# Patient Record
Sex: Female | Born: 1960 | Race: Black or African American | Hispanic: No | Marital: Single
Health system: Midwestern US, Community
[De-identification: ages and names within clinical notes are randomized; demographics above are authoritative.]

## PROBLEM LIST (undated history)

## (undated) ENCOUNTER — Ambulatory Visit: Admission: EM | Payer: Medicare Other | Source: Home / Self Care

## (undated) DIAGNOSIS — Z5189 Encounter for other specified aftercare: Secondary | ICD-10-CM

## (undated) DIAGNOSIS — Z1501 Genetic susceptibility to malignant neoplasm of breast: Secondary | ICD-10-CM

## (undated) DIAGNOSIS — E785 Hyperlipidemia, unspecified: Secondary | ICD-10-CM

## (undated) DIAGNOSIS — G629 Polyneuropathy, unspecified: Secondary | ICD-10-CM

## (undated) DIAGNOSIS — I10 Essential (primary) hypertension: Secondary | ICD-10-CM

## (undated) DIAGNOSIS — E669 Obesity, unspecified: Secondary | ICD-10-CM

## (undated) DIAGNOSIS — F4320 Adjustment disorder, unspecified: Secondary | ICD-10-CM

## (undated) DIAGNOSIS — C569 Malignant neoplasm of unspecified ovary: Secondary | ICD-10-CM

## (undated) DIAGNOSIS — F419 Anxiety disorder, unspecified: Secondary | ICD-10-CM

## (undated) DIAGNOSIS — H539 Unspecified visual disturbance: Secondary | ICD-10-CM

## (undated) DIAGNOSIS — T7840XA Allergy, unspecified, initial encounter: Secondary | ICD-10-CM

## (undated) DIAGNOSIS — Z86711 Personal history of pulmonary embolism: Secondary | ICD-10-CM

## (undated) DIAGNOSIS — N921 Excessive and frequent menstruation with irregular cycle: Secondary | ICD-10-CM

## (undated) DIAGNOSIS — F329 Major depressive disorder, single episode, unspecified: Secondary | ICD-10-CM

## (undated) DIAGNOSIS — M1990 Unspecified osteoarthritis, unspecified site: Secondary | ICD-10-CM

## (undated) DIAGNOSIS — K5190 Ulcerative colitis, unspecified, without complications: Secondary | ICD-10-CM

## (undated) DIAGNOSIS — C801 Malignant (primary) neoplasm, unspecified: Secondary | ICD-10-CM

## (undated) DIAGNOSIS — D500 Iron deficiency anemia secondary to blood loss (chronic): Secondary | ICD-10-CM

## (undated) DIAGNOSIS — F32A Depression, unspecified: Secondary | ICD-10-CM

## (undated) DIAGNOSIS — D689 Coagulation defect, unspecified: Secondary | ICD-10-CM

## (undated) DIAGNOSIS — Z1502 Genetic susceptibility to malignant neoplasm of ovary: Principal | ICD-10-CM

## (undated) DIAGNOSIS — Z1509 Genetic susceptibility to other malignant neoplasm: Secondary | ICD-10-CM

## (undated) DIAGNOSIS — R1909 Other intra-abdominal and pelvic swelling, mass and lump: Secondary | ICD-10-CM

## (undated) DIAGNOSIS — I7300 Raynaud's syndrome without gangrene: Secondary | ICD-10-CM

## (undated) DIAGNOSIS — K219 Gastro-esophageal reflux disease without esophagitis: Secondary | ICD-10-CM

## (undated) DIAGNOSIS — G709 Myoneural disorder, unspecified: Secondary | ICD-10-CM

## (undated) DIAGNOSIS — C4492 Squamous cell carcinoma of skin, unspecified: Secondary | ICD-10-CM

## (undated) HISTORY — PX: REDUCTION MAMMAPLASTY: SUR839

## (undated) HISTORY — DX: Anxiety disorder, unspecified: F41.9

## (undated) HISTORY — DX: Polyneuropathy, unspecified: G62.9

## (undated) HISTORY — DX: Genetic susceptibility to malignant neoplasm of breast: Z15.01

## (undated) HISTORY — DX: Squamous cell carcinoma of skin, unspecified: C44.92

## (undated) HISTORY — DX: Depression, unspecified: F32.A

## (undated) HISTORY — PX: TOENAIL EXCISION: SUR558

## (undated) HISTORY — DX: Allergy, unspecified, initial encounter: T78.40XA

## (undated) HISTORY — DX: Genetic susceptibility to malignant neoplasm of ovary: Z15.02

## (undated) HISTORY — DX: Excessive and frequent menstruation with irregular cycle: N92.1

## (undated) HISTORY — DX: Adjustment disorder, unspecified: F43.20

## (undated) HISTORY — DX: Unspecified osteoarthritis, unspecified site: M19.90

## (undated) HISTORY — PX: TONSILLECTOMY: SHX5217

## (undated) HISTORY — PX: ESOPHAGOGASTRODUODENOSCOPY: SHX1529

## (undated) HISTORY — DX: Unspecified visual disturbance: H53.9

## (undated) HISTORY — DX: Iron deficiency anemia secondary to blood loss (chronic): D50.0

## (undated) HISTORY — DX: Major depressive disorder, single episode, unspecified: F32.9

## (undated) HISTORY — DX: Encounter for other specified aftercare: Z51.89

## (undated) HISTORY — DX: Raynaud's syndrome without gangrene: I73.00

## (undated) HISTORY — DX: Hyperlipidemia, unspecified: E78.5

## (undated) HISTORY — DX: Coagulation defect, unspecified: D68.9

## (undated) HISTORY — DX: Genetic susceptibility to other malignant neoplasm: Z15.09

## (undated) HISTORY — DX: Ulcerative colitis, unspecified, without complications: K51.90

## (undated) HISTORY — PX: COLONOSCOPY: SHX174

## (undated) HISTORY — DX: Malignant (primary) neoplasm, unspecified: C80.1

## (undated) HISTORY — DX: Other intra-abdominal and pelvic swelling, mass and lump: R19.09

## (undated) HISTORY — PX: AUGMENTATION MAMMAPLASTY: SUR837

## (undated) HISTORY — PX: BREAST BIOPSY: SHX20

## (undated) HISTORY — DX: Obesity, unspecified: E66.9

## (undated) HISTORY — DX: Myoneural disorder, unspecified: G70.9

## (undated) HISTORY — DX: Gastro-esophageal reflux disease without esophagitis: K21.9

## (undated) HISTORY — DX: Personal history of pulmonary embolism: Z86.711

---
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
## 1998-05-13 HISTORY — PX: BREAST REDUCTION SURGERY: SHX8

## 1998-05-22 ENCOUNTER — Ambulatory Visit (HOSPITAL_BASED_OUTPATIENT_CLINIC_OR_DEPARTMENT_OTHER): Admission: RE | Admit: 1998-05-22 | Discharge: 1998-05-22 | Payer: Self-pay | Admitting: Specialist

## 1999-07-09 ENCOUNTER — Other Ambulatory Visit: Admission: RE | Admit: 1999-07-09 | Discharge: 1999-07-09 | Payer: Self-pay | Admitting: Gynecology

## 1999-07-17 ENCOUNTER — Encounter (INDEPENDENT_AMBULATORY_CARE_PROVIDER_SITE_OTHER): Payer: Self-pay

## 1999-07-17 ENCOUNTER — Other Ambulatory Visit: Admission: RE | Admit: 1999-07-17 | Discharge: 1999-07-17 | Payer: Self-pay | Admitting: Gynecology

## 1999-11-05 ENCOUNTER — Encounter: Payer: Self-pay | Admitting: Internal Medicine

## 2000-03-04 ENCOUNTER — Other Ambulatory Visit: Admission: RE | Admit: 2000-03-04 | Discharge: 2000-03-04 | Payer: Self-pay | Admitting: Gynecology

## 2000-09-24 ENCOUNTER — Other Ambulatory Visit: Admission: RE | Admit: 2000-09-24 | Discharge: 2000-09-24 | Payer: Self-pay | Admitting: Gynecology

## 2002-01-20 ENCOUNTER — Emergency Department (HOSPITAL_COMMUNITY): Admission: EM | Admit: 2002-01-20 | Discharge: 2002-01-20 | Payer: Self-pay | Admitting: Emergency Medicine

## 2002-12-24 ENCOUNTER — Encounter: Payer: Self-pay | Admitting: Internal Medicine

## 2002-12-24 ENCOUNTER — Other Ambulatory Visit: Admission: RE | Admit: 2002-12-24 | Discharge: 2002-12-24 | Payer: Self-pay | Admitting: Internal Medicine

## 2003-01-28 ENCOUNTER — Ambulatory Visit: Admission: RE | Admit: 2003-01-28 | Discharge: 2003-03-12 | Payer: Self-pay | Admitting: Radiation Oncology

## 2003-02-14 ENCOUNTER — Encounter: Payer: Self-pay | Admitting: Internal Medicine

## 2003-02-21 ENCOUNTER — Encounter: Payer: Self-pay | Admitting: Internal Medicine

## 2003-06-08 ENCOUNTER — Ambulatory Visit: Admission: RE | Admit: 2003-06-08 | Discharge: 2003-06-23 | Payer: Self-pay | Admitting: Radiation Oncology

## 2003-07-07 ENCOUNTER — Ambulatory Visit: Admission: RE | Admit: 2003-07-07 | Discharge: 2003-07-07 | Payer: Self-pay | Admitting: Radiation Oncology

## 2003-09-22 ENCOUNTER — Other Ambulatory Visit: Admission: RE | Admit: 2003-09-22 | Discharge: 2003-09-22 | Payer: Self-pay | Admitting: Gynecology

## 2003-09-29 ENCOUNTER — Ambulatory Visit (HOSPITAL_COMMUNITY): Admission: RE | Admit: 2003-09-29 | Discharge: 2003-09-29 | Payer: Self-pay | Admitting: Gynecology

## 2004-01-22 ENCOUNTER — Emergency Department (HOSPITAL_COMMUNITY): Admission: EM | Admit: 2004-01-22 | Discharge: 2004-01-22 | Payer: Self-pay | Admitting: Emergency Medicine

## 2004-06-27 ENCOUNTER — Other Ambulatory Visit: Admission: RE | Admit: 2004-06-27 | Discharge: 2004-06-27 | Payer: Self-pay | Admitting: Gynecology

## 2004-09-18 ENCOUNTER — Ambulatory Visit: Payer: Self-pay | Admitting: Internal Medicine

## 2005-02-27 ENCOUNTER — Ambulatory Visit: Payer: Self-pay | Admitting: Internal Medicine

## 2005-04-15 ENCOUNTER — Ambulatory Visit: Payer: Self-pay | Admitting: Hematology & Oncology

## 2005-04-17 ENCOUNTER — Emergency Department (HOSPITAL_COMMUNITY): Admission: EM | Admit: 2005-04-17 | Discharge: 2005-04-17 | Payer: Self-pay | Admitting: Emergency Medicine

## 2005-04-18 ENCOUNTER — Encounter: Admission: RE | Admit: 2005-04-18 | Discharge: 2005-04-18 | Payer: Self-pay | Admitting: Internal Medicine

## 2005-04-29 ENCOUNTER — Ambulatory Visit: Payer: Self-pay | Admitting: Internal Medicine

## 2005-07-25 ENCOUNTER — Encounter: Admission: RE | Admit: 2005-07-25 | Discharge: 2005-07-25 | Payer: Self-pay | Admitting: Internal Medicine

## 2005-12-19 ENCOUNTER — Other Ambulatory Visit: Admission: RE | Admit: 2005-12-19 | Discharge: 2005-12-19 | Payer: Self-pay | Admitting: Gynecology

## 2006-02-25 ENCOUNTER — Encounter: Admission: RE | Admit: 2006-02-25 | Discharge: 2006-02-25 | Payer: Self-pay | Admitting: Diagnostic Radiology

## 2006-03-13 ENCOUNTER — Ambulatory Visit (HOSPITAL_COMMUNITY): Admission: RE | Admit: 2006-03-13 | Discharge: 2006-03-13 | Payer: Self-pay | Admitting: Diagnostic Radiology

## 2006-03-18 ENCOUNTER — Ambulatory Visit (HOSPITAL_COMMUNITY): Admission: RE | Admit: 2006-03-18 | Discharge: 2006-03-19 | Payer: Self-pay | Admitting: Diagnostic Radiology

## 2006-04-24 ENCOUNTER — Encounter: Admission: RE | Admit: 2006-04-24 | Discharge: 2006-04-24 | Payer: Self-pay | Admitting: Diagnostic Radiology

## 2006-09-22 ENCOUNTER — Ambulatory Visit: Payer: Self-pay | Admitting: Internal Medicine

## 2006-09-23 LAB — CONVERTED CEMR LAB
Albumin: 3.6 g/dL (ref 3.5–5.2)
Alkaline Phosphatase: 60 units/L (ref 39–117)
BUN: 10 mg/dL (ref 6–23)
Basophils Absolute: 0.1 10*3/uL (ref 0.0–0.1)
Cholesterol: 192 mg/dL (ref 0–200)
GFR calc Af Amer: 87 mL/min
GFR calc non Af Amer: 72 mL/min
HDL: 35.4 mg/dL — ABNORMAL LOW (ref >39.0)
Hemoglobin: 10.2 g/dL — ABNORMAL LOW (ref 12.0–15.0)
LDL Cholesterol: 129 mg/dL — ABNORMAL HIGH (ref 0–99)
Lymphocytes Relative: 9.3 % — ABNORMAL LOW (ref 12.0–46.0)
MCHC: 31.5 g/dL (ref 30.0–36.0)
Monocytes Absolute: 0.4 10*3/uL (ref 0.2–0.7)
Monocytes Relative: 3.6 % (ref 3.0–11.0)
Neutro Abs: 8.3 10*3/uL — ABNORMAL HIGH (ref 1.4–7.7)
Potassium: 3.8 meq/L (ref 3.5–5.1)
RDW: 20.6 % — ABNORMAL HIGH (ref 11.5–14.6)
Triglycerides: 137 mg/dL (ref 0–149)

## 2006-09-24 ENCOUNTER — Encounter: Admission: RE | Admit: 2006-09-24 | Discharge: 2006-09-24 | Payer: Self-pay | Admitting: Internal Medicine

## 2006-09-27 ENCOUNTER — Encounter: Admission: RE | Admit: 2006-09-27 | Discharge: 2006-09-27 | Payer: Self-pay | Admitting: Diagnostic Radiology

## 2006-10-01 ENCOUNTER — Encounter: Admission: RE | Admit: 2006-10-01 | Discharge: 2006-10-01 | Payer: Self-pay | Admitting: Diagnostic Radiology

## 2006-10-08 ENCOUNTER — Encounter: Admission: RE | Admit: 2006-10-08 | Discharge: 2006-10-08 | Payer: Self-pay | Admitting: Internal Medicine

## 2006-10-08 ENCOUNTER — Encounter (INDEPENDENT_AMBULATORY_CARE_PROVIDER_SITE_OTHER): Payer: Self-pay | Admitting: Diagnostic Radiology

## 2006-12-15 DIAGNOSIS — Z86718 Personal history of other venous thrombosis and embolism: Secondary | ICD-10-CM | POA: Insufficient documentation

## 2006-12-15 DIAGNOSIS — D509 Iron deficiency anemia, unspecified: Secondary | ICD-10-CM

## 2006-12-17 ENCOUNTER — Ambulatory Visit: Payer: Self-pay | Admitting: Internal Medicine

## 2006-12-17 DIAGNOSIS — L659 Nonscarring hair loss, unspecified: Secondary | ICD-10-CM | POA: Insufficient documentation

## 2006-12-17 DIAGNOSIS — I8390 Asymptomatic varicose veins of unspecified lower extremity: Secondary | ICD-10-CM | POA: Insufficient documentation

## 2006-12-17 DIAGNOSIS — M79609 Pain in unspecified limb: Secondary | ICD-10-CM

## 2006-12-17 DIAGNOSIS — E785 Hyperlipidemia, unspecified: Secondary | ICD-10-CM

## 2006-12-17 DIAGNOSIS — R030 Elevated blood-pressure reading, without diagnosis of hypertension: Secondary | ICD-10-CM

## 2006-12-17 DIAGNOSIS — I10 Essential (primary) hypertension: Secondary | ICD-10-CM | POA: Insufficient documentation

## 2006-12-17 DIAGNOSIS — D649 Anemia, unspecified: Secondary | ICD-10-CM

## 2007-01-16 ENCOUNTER — Ambulatory Visit: Payer: Self-pay | Admitting: Internal Medicine

## 2007-01-16 DIAGNOSIS — C4490 Unspecified malignant neoplasm of skin, unspecified: Secondary | ICD-10-CM | POA: Insufficient documentation

## 2007-01-16 DIAGNOSIS — F17200 Nicotine dependence, unspecified, uncomplicated: Secondary | ICD-10-CM

## 2007-01-19 ENCOUNTER — Encounter: Payer: Self-pay | Admitting: Internal Medicine

## 2007-01-22 ENCOUNTER — Encounter: Payer: Self-pay | Admitting: Internal Medicine

## 2007-02-02 ENCOUNTER — Ambulatory Visit: Payer: Self-pay | Admitting: Internal Medicine

## 2007-02-02 DIAGNOSIS — J309 Allergic rhinitis, unspecified: Secondary | ICD-10-CM | POA: Insufficient documentation

## 2007-02-26 ENCOUNTER — Ambulatory Visit: Payer: Self-pay | Admitting: Internal Medicine

## 2007-03-03 LAB — CONVERTED CEMR LAB
Calcium: 9.8 mg/dL (ref 8.4–10.5)
Chloride: 103 meq/L (ref 96–112)
GFR calc non Af Amer: 82 mL/min
HDL: 33.5 mg/dL — ABNORMAL LOW (ref >39.0)
LDL Cholesterol: 137 mg/dL — ABNORMAL HIGH (ref 0–99)
Sodium: 137 meq/L (ref 135–145)
VLDL: 21 mg/dL (ref 0–40)

## 2007-03-05 ENCOUNTER — Ambulatory Visit: Payer: Self-pay | Admitting: Internal Medicine

## 2007-03-12 ENCOUNTER — Encounter: Payer: Self-pay | Admitting: Internal Medicine

## 2007-03-16 ENCOUNTER — Encounter: Admission: RE | Admit: 2007-03-16 | Discharge: 2007-03-16 | Payer: Self-pay | Admitting: Otolaryngology

## 2007-03-31 ENCOUNTER — Encounter: Payer: Self-pay | Admitting: Internal Medicine

## 2007-06-04 ENCOUNTER — Ambulatory Visit: Payer: Self-pay | Admitting: Internal Medicine

## 2007-06-08 LAB — CONVERTED CEMR LAB
CO2: 27 meq/L (ref 19–32)
Calcium: 9.5 mg/dL (ref 8.4–10.5)
Cholesterol: 132 mg/dL (ref 0–200)
GFR calc Af Amer: 99 mL/min
Glucose, Bld: 88 mg/dL (ref 70–99)
Total CHOL/HDL Ratio: 4.1

## 2007-06-12 ENCOUNTER — Ambulatory Visit: Payer: Self-pay | Admitting: Internal Medicine

## 2007-06-12 DIAGNOSIS — I7300 Raynaud's syndrome without gangrene: Secondary | ICD-10-CM

## 2007-06-12 DIAGNOSIS — E349 Endocrine disorder, unspecified: Secondary | ICD-10-CM | POA: Insufficient documentation

## 2007-06-12 DIAGNOSIS — Z9884 Bariatric surgery status: Secondary | ICD-10-CM | POA: Insufficient documentation

## 2007-06-12 LAB — CONVERTED CEMR LAB: HDL goal, serum: 40 mg/dL

## 2007-06-16 ENCOUNTER — Telehealth: Payer: Self-pay | Admitting: Internal Medicine

## 2007-08-26 ENCOUNTER — Ambulatory Visit: Payer: Self-pay | Admitting: Internal Medicine

## 2007-09-07 ENCOUNTER — Ambulatory Visit: Payer: Self-pay | Admitting: Internal Medicine

## 2007-09-09 LAB — CONVERTED CEMR LAB
Basophils Absolute: 0 10*3/uL (ref 0.0–0.1)
Chloride: 108 meq/L (ref 96–112)
Cholesterol: 147 mg/dL (ref 0–200)
Eosinophils Absolute: 0.1 10*3/uL (ref 0.0–0.7)
Ferritin: 17.1 ng/mL (ref 10.0–291.0)
GFR calc Af Amer: 86 mL/min
GFR calc non Af Amer: 71 mL/min
HDL: 37.5 mg/dL — ABNORMAL LOW (ref >39.0)
MCHC: 33.1 g/dL (ref 30.0–36.0)
MCV: 88.1 fL (ref 78.0–100.0)
Neutrophils Relative %: 68.6 % (ref 43.0–77.0)
Platelets: 227 10*3/uL (ref 150–400)
Potassium: 3.7 meq/L (ref 3.5–5.1)
RDW: 13.9 % (ref 11.5–14.6)
Sodium: 142 meq/L (ref 135–145)
Triglycerides: 120 mg/dL (ref 0–149)
VLDL: 24 mg/dL (ref 0–40)
WBC: 6.7 10*3/uL (ref 4.5–10.5)

## 2007-09-15 ENCOUNTER — Ambulatory Visit: Payer: Self-pay | Admitting: Internal Medicine

## 2007-09-15 DIAGNOSIS — E559 Vitamin D deficiency, unspecified: Secondary | ICD-10-CM

## 2007-12-08 ENCOUNTER — Ambulatory Visit: Payer: Self-pay | Admitting: Internal Medicine

## 2007-12-08 LAB — CONVERTED CEMR LAB
CO2: 27 meq/L (ref 19–32)
Calcium: 9.2 mg/dL (ref 8.4–10.5)
Chloride: 105 meq/L (ref 96–112)
Cholesterol: 195 mg/dL (ref 0–200)
Creatinine, Ser: 0.9 mg/dL (ref 0.4–1.2)
Glucose, Bld: 96 mg/dL (ref 70–99)
HDL: 38.6 mg/dL — ABNORMAL LOW (ref >39.0)
TSH: 4.14 microintl units/mL (ref 0.35–5.50)
Total CHOL/HDL Ratio: 5.1
Triglycerides: 139 mg/dL (ref 0–149)

## 2007-12-14 LAB — CONVERTED CEMR LAB: Vit D, 1,25-Dihydroxy: 18 — ABNORMAL LOW (ref 30–89)

## 2007-12-15 ENCOUNTER — Ambulatory Visit: Payer: Self-pay | Admitting: Internal Medicine

## 2008-02-08 ENCOUNTER — Ambulatory Visit: Payer: Self-pay | Admitting: Internal Medicine

## 2008-02-08 DIAGNOSIS — R05 Cough: Secondary | ICD-10-CM

## 2008-02-11 ENCOUNTER — Telehealth (INDEPENDENT_AMBULATORY_CARE_PROVIDER_SITE_OTHER): Payer: Self-pay | Admitting: *Deleted

## 2008-03-01 ENCOUNTER — Encounter: Payer: Self-pay | Admitting: Internal Medicine

## 2008-03-03 ENCOUNTER — Encounter: Admission: RE | Admit: 2008-03-03 | Discharge: 2008-03-03 | Payer: Self-pay | Admitting: Gynecology

## 2008-03-04 ENCOUNTER — Encounter: Admission: RE | Admit: 2008-03-04 | Discharge: 2008-03-04 | Payer: Self-pay | Admitting: Surgery

## 2008-03-22 ENCOUNTER — Ambulatory Visit: Payer: Self-pay | Admitting: Internal Medicine

## 2008-03-22 LAB — CONVERTED CEMR LAB
ALT: 15 units/L (ref 0–35)
CO2: 28 meq/L (ref 19–32)
Calcium: 9.2 mg/dL (ref 8.4–10.5)
Creatinine, Ser: 0.9 mg/dL (ref 0.4–1.2)
GFR calc Af Amer: 86 mL/min
Glucose, Bld: 88 mg/dL (ref 70–99)
HDL: 35.4 mg/dL — ABNORMAL LOW (ref >39.0)
Sodium: 140 meq/L (ref 135–145)
Total Bilirubin: 0.8 mg/dL (ref 0.3–1.2)
Total CHOL/HDL Ratio: 4.5
Total Protein: 6.4 g/dL (ref 6.0–8.3)
Triglycerides: 168 mg/dL — ABNORMAL HIGH (ref 0–149)
VLDL: 34 mg/dL (ref 0–40)
Vit D, 1,25-Dihydroxy: 20 — ABNORMAL LOW (ref 30–89)

## 2008-03-28 ENCOUNTER — Other Ambulatory Visit: Admission: RE | Admit: 2008-03-28 | Discharge: 2008-03-28 | Payer: Self-pay | Admitting: Gynecology

## 2008-04-11 ENCOUNTER — Ambulatory Visit: Payer: Self-pay | Admitting: Internal Medicine

## 2008-04-12 ENCOUNTER — Encounter: Payer: Self-pay | Admitting: Internal Medicine

## 2008-04-12 DIAGNOSIS — C569 Malignant neoplasm of unspecified ovary: Secondary | ICD-10-CM

## 2008-04-12 HISTORY — DX: Malignant neoplasm of unspecified ovary: C56.9

## 2008-04-27 ENCOUNTER — Ambulatory Visit (HOSPITAL_COMMUNITY): Admission: RE | Admit: 2008-04-27 | Discharge: 2008-04-27 | Payer: Self-pay | Admitting: Surgery

## 2008-04-27 ENCOUNTER — Encounter (INDEPENDENT_AMBULATORY_CARE_PROVIDER_SITE_OTHER): Payer: Self-pay | Admitting: Surgery

## 2008-05-02 ENCOUNTER — Ambulatory Visit: Payer: Self-pay | Admitting: Internal Medicine

## 2008-05-02 ENCOUNTER — Telehealth: Payer: Self-pay | Admitting: *Deleted

## 2008-05-02 ENCOUNTER — Ambulatory Visit: Payer: Self-pay | Admitting: Oncology

## 2008-05-02 DIAGNOSIS — R4589 Other symptoms and signs involving emotional state: Secondary | ICD-10-CM | POA: Insufficient documentation

## 2008-05-03 ENCOUNTER — Ambulatory Visit: Payer: Self-pay | Admitting: Internal Medicine

## 2008-05-03 ENCOUNTER — Ambulatory Visit: Payer: Self-pay | Admitting: Hematology & Oncology

## 2008-05-03 ENCOUNTER — Telehealth: Payer: Self-pay | Admitting: Internal Medicine

## 2008-05-09 ENCOUNTER — Encounter: Payer: Self-pay | Admitting: Internal Medicine

## 2008-05-09 LAB — CBC WITH DIFFERENTIAL (CANCER CENTER ONLY)
BASO%: 0.6 % (ref 0.0–2.0)
LYMPH%: 22.6 % (ref 14.0–48.0)
MCH: 28.9 pg (ref 26.0–34.0)
MCV: 87 fL (ref 81–101)
MONO%: 5.2 % (ref 0.0–13.0)
NEUT%: 70.2 % (ref 39.6–80.0)
Platelets: 249 10*3/uL (ref 145–400)
RDW: 12.5 % (ref 10.5–14.6)

## 2008-05-09 LAB — CA 125: CA 125: 7.3 U/mL (ref 0.0–30.2)

## 2008-05-09 LAB — CEA: CEA: 2.2 ng/mL (ref 0.0–5.0)

## 2008-05-09 LAB — COMPREHENSIVE METABOLIC PANEL
ALT: 15 U/L (ref 0–35)
Alkaline Phosphatase: 57 U/L (ref 39–117)
CO2: 24 mEq/L (ref 19–32)
Potassium: 3.7 mEq/L (ref 3.5–5.3)
Sodium: 138 mEq/L (ref 135–145)
Total Bilirubin: 0.6 mg/dL (ref 0.3–1.2)
Total Protein: 7.2 g/dL (ref 6.0–8.3)

## 2008-05-10 ENCOUNTER — Ambulatory Visit: Admission: RE | Admit: 2008-05-10 | Discharge: 2008-05-10 | Payer: Self-pay | Admitting: Gynecology

## 2008-05-11 ENCOUNTER — Ambulatory Visit (HOSPITAL_COMMUNITY): Admission: RE | Admit: 2008-05-11 | Discharge: 2008-05-11 | Payer: Self-pay | Admitting: Hematology & Oncology

## 2008-05-13 HISTORY — PX: ABDOMINAL HYSTERECTOMY: SHX81

## 2008-05-17 ENCOUNTER — Encounter: Payer: Self-pay | Admitting: Gynecologic Oncology

## 2008-05-17 ENCOUNTER — Encounter: Payer: Self-pay | Admitting: Internal Medicine

## 2008-05-17 ENCOUNTER — Ambulatory Visit (HOSPITAL_COMMUNITY): Admission: RE | Admit: 2008-05-17 | Discharge: 2008-05-17 | Payer: Self-pay | Admitting: Gynecologic Oncology

## 2008-05-18 ENCOUNTER — Telehealth: Payer: Self-pay | Admitting: Internal Medicine

## 2008-05-24 ENCOUNTER — Inpatient Hospital Stay (HOSPITAL_COMMUNITY): Admission: RE | Admit: 2008-05-24 | Discharge: 2008-05-27 | Payer: Self-pay | Admitting: Obstetrics & Gynecology

## 2008-05-24 ENCOUNTER — Encounter: Payer: Self-pay | Admitting: Gynecology

## 2008-05-26 ENCOUNTER — Encounter: Payer: Self-pay | Admitting: Gynecology

## 2008-05-31 ENCOUNTER — Encounter: Payer: Self-pay | Admitting: Internal Medicine

## 2008-06-03 ENCOUNTER — Encounter: Payer: Self-pay | Admitting: Emergency Medicine

## 2008-06-04 ENCOUNTER — Inpatient Hospital Stay (HOSPITAL_COMMUNITY): Admission: AD | Admit: 2008-06-04 | Discharge: 2008-06-06 | Payer: Self-pay | Admitting: Obstetrics & Gynecology

## 2008-06-14 ENCOUNTER — Ambulatory Visit: Admission: RE | Admit: 2008-06-14 | Discharge: 2008-06-14 | Payer: Self-pay | Admitting: Gynecologic Oncology

## 2008-06-14 ENCOUNTER — Encounter: Payer: Self-pay | Admitting: Gynecologic Oncology

## 2008-06-20 ENCOUNTER — Ambulatory Visit: Payer: Self-pay | Admitting: Hematology & Oncology

## 2008-06-21 ENCOUNTER — Encounter: Payer: Self-pay | Admitting: Internal Medicine

## 2008-06-21 LAB — COMPREHENSIVE METABOLIC PANEL
ALT: 11 U/L (ref 0–35)
BUN: 22 mg/dL (ref 6–23)
CO2: 32 mEq/L (ref 19–32)
Calcium: 9 mg/dL (ref 8.4–10.5)
Creatinine, Ser: 0.76 mg/dL (ref 0.40–1.20)
Glucose, Bld: 84 mg/dL (ref 70–99)
Total Bilirubin: 0.2 mg/dL — ABNORMAL LOW (ref 0.3–1.2)

## 2008-06-21 LAB — CBC WITH DIFFERENTIAL (CANCER CENTER ONLY)
BASO#: 0 10*3/uL (ref 0.0–0.2)
Eosinophils Absolute: 0.1 10*3/uL (ref 0.0–0.5)
HCT: 35.6 % (ref 34.8–46.6)
HGB: 11.8 g/dL (ref 11.6–15.9)
LYMPH%: 18.8 % (ref 14.0–48.0)
MCH: 28 pg (ref 26.0–34.0)
MCV: 85 fL (ref 81–101)
MONO%: 7.5 % (ref 0.0–13.0)
NEUT%: 72 % (ref 39.6–80.0)
RBC: 4.19 10*6/uL (ref 3.70–5.32)

## 2008-06-29 ENCOUNTER — Telehealth: Payer: Self-pay | Admitting: Internal Medicine

## 2008-07-06 ENCOUNTER — Ambulatory Visit: Admission: RE | Admit: 2008-07-06 | Discharge: 2008-07-06 | Payer: Self-pay | Admitting: Gynecologic Oncology

## 2008-07-06 ENCOUNTER — Encounter: Payer: Self-pay | Admitting: Internal Medicine

## 2008-07-11 ENCOUNTER — Ambulatory Visit: Payer: Self-pay | Admitting: Internal Medicine

## 2008-07-11 DIAGNOSIS — K5909 Other constipation: Secondary | ICD-10-CM | POA: Insufficient documentation

## 2008-07-11 DIAGNOSIS — R109 Unspecified abdominal pain: Secondary | ICD-10-CM

## 2008-07-11 DIAGNOSIS — C801 Malignant (primary) neoplasm, unspecified: Secondary | ICD-10-CM | POA: Insufficient documentation

## 2008-07-12 ENCOUNTER — Encounter: Payer: Self-pay | Admitting: Internal Medicine

## 2008-07-12 LAB — CBC WITH DIFFERENTIAL (CANCER CENTER ONLY)
BASO%: 1.1 % (ref 0.0–2.0)
EOS%: 2.2 % (ref 0.0–7.0)
LYMPH#: 1.1 10*3/uL (ref 0.9–3.3)
MCHC: 32.6 g/dL (ref 32.0–36.0)
NEUT#: 3 10*3/uL (ref 1.5–6.5)
Platelets: 218 10*3/uL (ref 145–400)
RDW: 13 % (ref 10.5–14.6)

## 2008-07-12 LAB — COMPREHENSIVE METABOLIC PANEL
AST: 17 U/L (ref 0–37)
Albumin: 4.3 g/dL (ref 3.5–5.2)
BUN: 19 mg/dL (ref 6–23)
Calcium: 9.6 mg/dL (ref 8.4–10.5)
Chloride: 98 mEq/L (ref 96–112)
Glucose, Bld: 96 mg/dL (ref 70–99)
Potassium: 2.9 mEq/L — ABNORMAL LOW (ref 3.5–5.3)
Sodium: 141 mEq/L (ref 135–145)
Total Protein: 7.2 g/dL (ref 6.0–8.3)

## 2008-07-19 ENCOUNTER — Encounter: Payer: Self-pay | Admitting: Internal Medicine

## 2008-07-19 LAB — CBC WITH DIFFERENTIAL (CANCER CENTER ONLY)
BASO#: 0 10*3/uL (ref 0.0–0.2)
EOS%: 1.3 % (ref 0.0–7.0)
Eosinophils Absolute: 0 10*3/uL (ref 0.0–0.5)
HGB: 11.3 g/dL — ABNORMAL LOW (ref 11.6–15.9)
LYMPH#: 0.7 10*3/uL — ABNORMAL LOW (ref 0.9–3.3)
MCHC: 33.2 g/dL (ref 32.0–36.0)
NEUT#: 0.4 10*3/uL — CL (ref 1.5–6.5)
RBC: 3.94 10*6/uL (ref 3.70–5.32)
WBC: 1.2 10*3/uL — ABNORMAL LOW (ref 3.9–10.0)

## 2008-07-19 LAB — CMP (CANCER CENTER ONLY)
ALT(SGPT): 25 U/L (ref 10–47)
AST: 27 U/L (ref 11–38)
Alkaline Phosphatase: 60 U/L (ref 26–84)
Calcium: 9.8 mg/dL (ref 8.0–10.3)
Chloride: 89 mEq/L — ABNORMAL LOW (ref 98–108)
Creat: 1.2 mg/dl (ref 0.6–1.2)

## 2008-07-20 ENCOUNTER — Telehealth: Payer: Self-pay | Admitting: Internal Medicine

## 2008-07-25 ENCOUNTER — Ambulatory Visit: Payer: Self-pay | Admitting: Internal Medicine

## 2008-07-25 ENCOUNTER — Telehealth: Payer: Self-pay | Admitting: Internal Medicine

## 2008-07-25 LAB — CONVERTED CEMR LAB
Basophils Absolute: 0 10*3/uL (ref 0.0–0.1)
Basophils Relative: 0.2 % (ref 0.0–3.0)
Eosinophils Relative: 2.5 % (ref 0.0–5.0)
HCT: 30.1 % — ABNORMAL LOW (ref 36.0–46.0)
MCHC: 34.3 g/dL (ref 30.0–36.0)
MCV: 87.8 fL (ref 78.0–100.0)
RBC: 3.43 M/uL — ABNORMAL LOW (ref 3.87–5.11)
WBC: 1.4 10*3/uL — CL (ref 4.5–10.5)

## 2008-07-29 ENCOUNTER — Encounter (INDEPENDENT_AMBULATORY_CARE_PROVIDER_SITE_OTHER): Payer: Self-pay | Admitting: Interventional Radiology

## 2008-07-29 ENCOUNTER — Ambulatory Visit (HOSPITAL_COMMUNITY): Admission: RE | Admit: 2008-07-29 | Discharge: 2008-07-29 | Payer: Self-pay | Admitting: Hematology & Oncology

## 2008-07-29 ENCOUNTER — Telehealth: Payer: Self-pay | Admitting: Internal Medicine

## 2008-08-02 ENCOUNTER — Encounter: Payer: Self-pay | Admitting: Internal Medicine

## 2008-08-02 LAB — CBC WITH DIFFERENTIAL (CANCER CENTER ONLY)
BASO#: 0 10*3/uL (ref 0.0–0.2)
Eosinophils Absolute: 0.1 10*3/uL (ref 0.0–0.5)
HGB: 9.1 g/dL — ABNORMAL LOW (ref 11.6–15.9)
LYMPH#: 1 10*3/uL (ref 0.9–3.3)
MCH: 28.5 pg (ref 26.0–34.0)
MONO%: 4.8 % (ref 0.0–13.0)
NEUT#: 2.5 10*3/uL (ref 1.5–6.5)
RBC: 3.19 10*6/uL — ABNORMAL LOW (ref 3.70–5.32)

## 2008-08-11 ENCOUNTER — Ambulatory Visit (HOSPITAL_COMMUNITY): Admission: RE | Admit: 2008-08-11 | Discharge: 2008-08-11 | Payer: Self-pay | Admitting: Hematology & Oncology

## 2008-08-15 ENCOUNTER — Ambulatory Visit: Payer: Self-pay | Admitting: Hematology & Oncology

## 2008-08-16 LAB — CBC WITH DIFFERENTIAL (CANCER CENTER ONLY)
EOS%: 2.3 % (ref 0.0–7.0)
MCH: 29.1 pg (ref 26.0–34.0)
MCHC: 32.9 g/dL (ref 32.0–36.0)
MONO%: 6.9 % (ref 0.0–13.0)
NEUT#: 4 10*3/uL (ref 1.5–6.5)
Platelets: 394 10*3/uL (ref 145–400)
RDW: 16.3 % — ABNORMAL HIGH (ref 10.5–14.6)

## 2008-08-19 ENCOUNTER — Ambulatory Visit (HOSPITAL_COMMUNITY): Admission: RE | Admit: 2008-08-19 | Discharge: 2008-08-19 | Payer: Self-pay | Admitting: Hematology & Oncology

## 2008-08-30 ENCOUNTER — Ambulatory Visit (HOSPITAL_COMMUNITY): Admission: RE | Admit: 2008-08-30 | Discharge: 2008-08-30 | Payer: Self-pay | Admitting: Hematology & Oncology

## 2008-08-31 LAB — CONVERTED CEMR LAB
AntiThromb III Func: 59 % — ABNORMAL LOW (ref 76–126)
Homocysteine: 7.7 micromoles/L (ref 4.0–15.4)

## 2008-09-01 ENCOUNTER — Ambulatory Visit: Payer: Self-pay | Admitting: Hematology

## 2008-09-06 ENCOUNTER — Encounter: Payer: Self-pay | Admitting: Internal Medicine

## 2008-09-06 LAB — CBC WITH DIFFERENTIAL (CANCER CENTER ONLY)
BASO%: 0.7 % (ref 0.0–2.0)
EOS%: 1.6 % (ref 0.0–7.0)
HCT: 28.4 % — ABNORMAL LOW (ref 34.8–46.6)
LYMPH%: 18.1 % (ref 14.0–48.0)
MCHC: 34.1 g/dL (ref 32.0–36.0)
MCV: 89 fL (ref 81–101)
NEUT%: 73.7 % (ref 39.6–80.0)
Platelets: 272 10*3/uL (ref 145–400)
RDW: 19 % — ABNORMAL HIGH (ref 10.5–14.6)

## 2008-09-06 LAB — COMPREHENSIVE METABOLIC PANEL
Albumin: 3.7 g/dL (ref 3.5–5.2)
Alkaline Phosphatase: 92 U/L (ref 39–117)
BUN: 15 mg/dL (ref 6–23)
Glucose, Bld: 147 mg/dL — ABNORMAL HIGH (ref 70–99)
Total Bilirubin: 0.5 mg/dL (ref 0.3–1.2)

## 2008-09-07 ENCOUNTER — Telehealth: Payer: Self-pay | Admitting: Internal Medicine

## 2008-09-19 LAB — PROTIME-INR: INR: 4 — ABNORMAL HIGH (ref 2.00–3.50)

## 2008-09-27 ENCOUNTER — Encounter: Payer: Self-pay | Admitting: Internal Medicine

## 2008-09-27 LAB — COMPREHENSIVE METABOLIC PANEL
ALT: 19 U/L (ref 0–35)
AST: 24 U/L (ref 0–37)
BUN: 36 mg/dL — ABNORMAL HIGH (ref 6–23)
Creatinine, Ser: 1 mg/dL (ref 0.40–1.20)
Total Bilirubin: 0.5 mg/dL (ref 0.3–1.2)

## 2008-09-27 LAB — CBC WITH DIFFERENTIAL (CANCER CENTER ONLY)
BASO#: 0 10*3/uL (ref 0.0–0.2)
EOS%: 1.1 % (ref 0.0–7.0)
HCT: 28.9 % — ABNORMAL LOW (ref 34.8–46.6)
HGB: 9.9 g/dL — ABNORMAL LOW (ref 11.6–15.9)
LYMPH%: 29.4 % (ref 14.0–48.0)
MCH: 31.7 pg (ref 26.0–34.0)
MCHC: 34.2 g/dL (ref 32.0–36.0)
MONO%: 4.8 % (ref 0.0–13.0)
NEUT#: 2.4 10*3/uL (ref 1.5–6.5)
NEUT%: 64.3 % (ref 39.6–80.0)

## 2008-09-27 LAB — PROTIME-INR (CHCC SATELLITE): INR: 2.1 (ref 2.0–3.5)

## 2008-10-12 ENCOUNTER — Telehealth: Payer: Self-pay | Admitting: Internal Medicine

## 2008-10-12 ENCOUNTER — Inpatient Hospital Stay (HOSPITAL_COMMUNITY): Admission: EM | Admit: 2008-10-12 | Discharge: 2008-10-13 | Payer: Self-pay | Admitting: Emergency Medicine

## 2008-10-12 ENCOUNTER — Ambulatory Visit: Payer: Self-pay | Admitting: Oncology

## 2008-10-14 ENCOUNTER — Ambulatory Visit: Payer: Self-pay | Admitting: Hematology & Oncology

## 2008-10-14 ENCOUNTER — Encounter: Payer: Self-pay | Admitting: Internal Medicine

## 2008-10-14 LAB — TECHNOLOGIST REVIEW CHCC SATELLITE

## 2008-10-14 LAB — CMP (CANCER CENTER ONLY)
ALT(SGPT): 21 U/L (ref 10–47)
AST: 27 U/L (ref 11–38)
Albumin: 3.3 g/dL (ref 3.3–5.5)
CO2: 30 mEq/L (ref 18–33)
Calcium: 9.5 mg/dL (ref 8.0–10.3)
Chloride: 98 mEq/L (ref 98–108)
Creat: 0.9 mg/dl (ref 0.6–1.2)
Potassium: 3.1 mEq/L — ABNORMAL LOW (ref 3.3–4.7)

## 2008-10-14 LAB — CBC WITH DIFFERENTIAL (CANCER CENTER ONLY)
Eosinophils Absolute: 0 10*3/uL (ref 0.0–0.5)
HCT: 30.2 % — ABNORMAL LOW (ref 34.8–46.6)
HGB: 10.1 g/dL — ABNORMAL LOW (ref 11.6–15.9)
LYMPH%: 32.1 % (ref 14.0–48.0)
MCV: 94 fL (ref 81–101)
MONO#: 0.1 10*3/uL (ref 0.1–0.9)
Platelets: 51 10*3/uL — ABNORMAL LOW (ref 145–400)
RBC: 3.2 10*6/uL — ABNORMAL LOW (ref 3.70–5.32)
WBC: 2.6 10*3/uL — ABNORMAL LOW (ref 3.9–10.0)

## 2008-10-14 LAB — PROTIME-INR (CHCC SATELLITE)
INR: 1.2 — ABNORMAL LOW (ref 2.0–3.5)
Protime: 14.4 Seconds — ABNORMAL HIGH (ref 10.6–13.4)

## 2008-10-17 ENCOUNTER — Ambulatory Visit: Payer: Self-pay | Admitting: Oncology

## 2008-10-19 LAB — PROTIME-INR
INR: 1.4 — ABNORMAL LOW (ref 2.00–3.50)
Protime: 16.8 Seconds — ABNORMAL HIGH (ref 10.6–13.4)

## 2008-10-19 LAB — BASIC METABOLIC PANEL
BUN: 16 mg/dL (ref 6–23)
Calcium: 9.3 mg/dL (ref 8.4–10.5)
Creatinine, Ser: 1.14 mg/dL (ref 0.40–1.20)
Glucose, Bld: 109 mg/dL — ABNORMAL HIGH (ref 70–99)

## 2008-10-27 ENCOUNTER — Encounter: Payer: Self-pay | Admitting: Internal Medicine

## 2008-10-27 LAB — COMPREHENSIVE METABOLIC PANEL
ALT: 28 U/L (ref 0–35)
AST: 26 U/L (ref 0–37)
Alkaline Phosphatase: 73 U/L (ref 39–117)
Sodium: 138 mEq/L (ref 135–145)
Total Bilirubin: 0.4 mg/dL (ref 0.3–1.2)
Total Protein: 7.1 g/dL (ref 6.0–8.3)

## 2008-10-27 LAB — CBC WITH DIFFERENTIAL (CANCER CENTER ONLY)
HCT: 30.6 % — ABNORMAL LOW (ref 34.8–46.6)
MCV: 96 fL (ref 81–101)
RDW: 15.4 % — ABNORMAL HIGH (ref 10.5–14.6)
WBC: 3.7 10*3/uL — ABNORMAL LOW (ref 3.9–10.0)

## 2008-10-27 LAB — MANUAL DIFFERENTIAL (CHCC SATELLITE)
ALC: 1.6 10*3/uL (ref 0.6–2.2)
PLT EST ~~LOC~~: ADEQUATE
Platelet Morphology: NORMAL
nRBC: 5 % — ABNORMAL HIGH (ref 0–0)

## 2008-10-27 LAB — PROTIME-INR (CHCC SATELLITE)
INR: 4.7 — ABNORMAL HIGH (ref 2.0–3.5)
Protime: 56.4 s — ABNORMAL HIGH (ref 10.6–13.4)

## 2008-11-18 ENCOUNTER — Encounter: Payer: Self-pay | Admitting: Internal Medicine

## 2008-11-18 ENCOUNTER — Ambulatory Visit: Payer: Self-pay | Admitting: Hematology & Oncology

## 2008-11-18 LAB — COMPREHENSIVE METABOLIC PANEL
AST: 23 U/L (ref 0–37)
Alkaline Phosphatase: 68 U/L (ref 39–117)
BUN: 29 mg/dL — ABNORMAL HIGH (ref 6–23)
Calcium: 9.5 mg/dL (ref 8.4–10.5)
Chloride: 103 mEq/L (ref 96–112)
Creatinine, Ser: 0.92 mg/dL (ref 0.40–1.20)
Glucose, Bld: 82 mg/dL (ref 70–99)

## 2008-11-18 LAB — CBC WITH DIFFERENTIAL (CANCER CENTER ONLY)
Eosinophils Absolute: 0.1 10*3/uL (ref 0.0–0.5)
LYMPH#: 1 10*3/uL (ref 0.9–3.3)
MONO#: 0.3 10*3/uL (ref 0.1–0.9)
NEUT#: 4.9 10*3/uL (ref 1.5–6.5)
Platelets: 272 10*3/uL (ref 145–400)
RBC: 3.35 10*6/uL — ABNORMAL LOW (ref 3.70–5.32)
WBC: 6.3 10*3/uL (ref 3.9–10.0)

## 2008-11-18 LAB — PROTIME-INR (CHCC SATELLITE)
INR: 2 (ref 2.0–3.5)
Protime: 24 Seconds — ABNORMAL HIGH (ref 10.6–13.4)

## 2008-12-21 ENCOUNTER — Ambulatory Visit: Payer: Self-pay | Admitting: Hematology & Oncology

## 2008-12-22 ENCOUNTER — Encounter: Payer: Self-pay | Admitting: Internal Medicine

## 2008-12-22 LAB — CBC WITH DIFFERENTIAL (CANCER CENTER ONLY)
BASO#: 0 10*3/uL (ref 0.0–0.2)
EOS%: 2 % (ref 0.0–7.0)
HGB: 12.3 g/dL (ref 11.6–15.9)
LYMPH#: 1.2 10*3/uL (ref 0.9–3.3)
MCHC: 33.6 g/dL (ref 32.0–36.0)
NEUT#: 2.7 10*3/uL (ref 1.5–6.5)
RBC: 3.74 10*6/uL (ref 3.70–5.32)

## 2008-12-22 LAB — CMP (CANCER CENTER ONLY)
CO2: 28 mEq/L (ref 18–33)
Calcium: 10.1 mg/dL (ref 8.0–10.3)
Creat: 0.9 mg/dl (ref 0.6–1.2)
Glucose, Bld: 80 mg/dL (ref 73–118)
Total Bilirubin: 0.6 mg/dl (ref 0.20–1.60)

## 2008-12-28 ENCOUNTER — Ambulatory Visit (HOSPITAL_COMMUNITY): Admission: RE | Admit: 2008-12-28 | Discharge: 2008-12-28 | Payer: Self-pay | Admitting: Hematology & Oncology

## 2009-01-05 ENCOUNTER — Ambulatory Visit: Payer: Self-pay | Admitting: Oncology

## 2009-01-05 LAB — PROTIME-INR
INR: 2.5 (ref 2.00–3.50)
Protime: 30 Seconds — ABNORMAL HIGH (ref 10.6–13.4)

## 2009-01-11 ENCOUNTER — Encounter: Payer: Self-pay | Admitting: Internal Medicine

## 2009-01-11 LAB — PROTIME-INR (CHCC SATELLITE): Protime: 27.6 Seconds — ABNORMAL HIGH (ref 10.6–13.4)

## 2009-01-30 ENCOUNTER — Ambulatory Visit: Payer: Self-pay | Admitting: Oncology

## 2009-02-01 LAB — PROTIME-INR
INR: 3.1 (ref 2.00–3.50)
Protime: 37.2 Seconds — ABNORMAL HIGH (ref 10.6–13.4)

## 2009-02-09 ENCOUNTER — Ambulatory Visit: Payer: Self-pay | Admitting: Hematology & Oncology

## 2009-02-22 ENCOUNTER — Encounter (INDEPENDENT_AMBULATORY_CARE_PROVIDER_SITE_OTHER): Payer: Self-pay | Admitting: *Deleted

## 2009-02-22 LAB — CBC WITH DIFFERENTIAL (CANCER CENTER ONLY)
BASO%: 0.4 % (ref 0.0–2.0)
EOS%: 1.9 % (ref 0.0–7.0)
LYMPH#: 1.5 10*3/uL (ref 0.9–3.3)
MCHC: 34.3 g/dL (ref 32.0–36.0)
NEUT#: 3.7 10*3/uL (ref 1.5–6.5)
NEUT%: 65.2 % (ref 39.6–80.0)
RDW: 11.6 % (ref 10.5–14.6)

## 2009-02-22 LAB — COMPREHENSIVE METABOLIC PANEL
AST: 30 U/L (ref 0–37)
BUN: 16 mg/dL (ref 6–23)
Calcium: 9.6 mg/dL (ref 8.4–10.5)
Chloride: 103 mEq/L (ref 96–112)
Creatinine, Ser: 1.02 mg/dL (ref 0.40–1.20)
Total Bilirubin: 0.4 mg/dL (ref 0.3–1.2)

## 2009-02-22 LAB — PROTIME-INR (CHCC SATELLITE): INR: 3 (ref 2.0–3.5)

## 2009-03-01 ENCOUNTER — Ambulatory Visit: Payer: Self-pay | Admitting: Internal Medicine

## 2009-03-01 DIAGNOSIS — G622 Polyneuropathy due to other toxic agents: Secondary | ICD-10-CM

## 2009-03-01 DIAGNOSIS — F4323 Adjustment disorder with mixed anxiety and depressed mood: Secondary | ICD-10-CM

## 2009-03-01 DIAGNOSIS — L259 Unspecified contact dermatitis, unspecified cause: Secondary | ICD-10-CM

## 2009-03-06 ENCOUNTER — Encounter: Admission: RE | Admit: 2009-03-06 | Discharge: 2009-03-06 | Payer: Self-pay | Admitting: Internal Medicine

## 2009-03-06 LAB — CONVERTED CEMR LAB: Vit D, 25-Hydroxy: 24 ng/mL — ABNORMAL LOW (ref 30–89)

## 2009-03-07 ENCOUNTER — Telehealth: Payer: Self-pay | Admitting: *Deleted

## 2009-03-07 LAB — CONVERTED CEMR LAB
ALT: 32 units/L (ref 0–35)
Albumin: 3.6 g/dL (ref 3.5–5.2)
BUN: 14 mg/dL (ref 6–23)
Bilirubin, Direct: 0 mg/dL (ref 0.0–0.3)
CO2: 32 meq/L (ref 19–32)
Calcium: 9.5 mg/dL (ref 8.4–10.5)
Cholesterol: 262 mg/dL — ABNORMAL HIGH (ref 0–200)
Creatinine, Ser: 1 mg/dL (ref 0.4–1.2)
Glucose, Bld: 91 mg/dL (ref 70–99)
Sodium: 143 meq/L (ref 135–145)
Total Protein: 6.4 g/dL (ref 6.0–8.3)
Triglycerides: 171 mg/dL — ABNORMAL HIGH (ref 0.0–149.0)

## 2009-03-22 ENCOUNTER — Ambulatory Visit (HOSPITAL_COMMUNITY): Admission: RE | Admit: 2009-03-22 | Discharge: 2009-03-22 | Payer: Self-pay | Admitting: Hematology & Oncology

## 2009-03-28 ENCOUNTER — Ambulatory Visit: Payer: Self-pay | Admitting: Hematology & Oncology

## 2009-03-29 ENCOUNTER — Encounter: Payer: Self-pay | Admitting: Internal Medicine

## 2009-03-29 LAB — CBC WITH DIFFERENTIAL (CANCER CENTER ONLY)
BASO%: 0.6 % (ref 0.0–2.0)
EOS%: 2.1 % (ref 0.0–7.0)
HCT: 36.3 % (ref 34.8–46.6)
LYMPH%: 19 % (ref 14.0–48.0)
MCHC: 34.7 g/dL (ref 32.0–36.0)
MCV: 88 fL (ref 81–101)
MONO%: 4.9 % (ref 0.0–13.0)
NEUT%: 73.4 % (ref 39.6–80.0)
RDW: 12.6 % (ref 10.5–14.6)

## 2009-03-29 LAB — COMPREHENSIVE METABOLIC PANEL
Albumin: 4 g/dL (ref 3.5–5.2)
Alkaline Phosphatase: 97 U/L (ref 39–117)
CO2: 24 mEq/L (ref 19–32)
Calcium: 8.9 mg/dL (ref 8.4–10.5)
Chloride: 103 mEq/L (ref 96–112)
Glucose, Bld: 104 mg/dL — ABNORMAL HIGH (ref 70–99)
Potassium: 4.2 mEq/L (ref 3.5–5.3)
Sodium: 140 mEq/L (ref 135–145)
Total Protein: 6.5 g/dL (ref 6.0–8.3)

## 2009-03-29 LAB — PROTIME-INR (CHCC SATELLITE): Protime: 19.2 Seconds — ABNORMAL HIGH (ref 10.6–13.4)

## 2009-04-26 ENCOUNTER — Ambulatory Visit (HOSPITAL_BASED_OUTPATIENT_CLINIC_OR_DEPARTMENT_OTHER): Admission: RE | Admit: 2009-04-26 | Discharge: 2009-04-26 | Payer: Self-pay | Admitting: Hematology & Oncology

## 2009-04-26 ENCOUNTER — Encounter: Payer: Self-pay | Admitting: Internal Medicine

## 2009-04-26 ENCOUNTER — Ambulatory Visit: Payer: Self-pay | Admitting: Diagnostic Radiology

## 2009-04-26 LAB — PROTIME-INR (CHCC SATELLITE): Protime: 43.2 Seconds — ABNORMAL HIGH (ref 10.6–13.4)

## 2009-05-10 ENCOUNTER — Ambulatory Visit: Payer: Self-pay | Admitting: Oncology

## 2009-05-30 ENCOUNTER — Ambulatory Visit: Payer: Self-pay | Admitting: Hematology & Oncology

## 2009-06-01 ENCOUNTER — Encounter: Payer: Self-pay | Admitting: Internal Medicine

## 2009-06-01 LAB — PROTIME-INR (CHCC SATELLITE)

## 2009-06-01 LAB — CBC WITH DIFFERENTIAL (CANCER CENTER ONLY)
BASO#: 0 10*3/uL (ref 0.0–0.2)
EOS%: 2 % (ref 0.0–7.0)
HCT: 37.8 % (ref 34.8–46.6)
HGB: 12.8 g/dL (ref 11.6–15.9)
LYMPH#: 1.9 10*3/uL (ref 0.9–3.3)
LYMPH%: 31.4 % (ref 14.0–48.0)
MCHC: 33.8 g/dL (ref 32.0–36.0)
MCV: 90 fL (ref 81–101)
MONO#: 0.3 10*3/uL (ref 0.1–0.9)
NEUT%: 60.6 % (ref 39.6–80.0)
RDW: 12 % (ref 10.5–14.6)

## 2009-06-01 LAB — COMPREHENSIVE METABOLIC PANEL
ALT: 54 U/L — ABNORMAL HIGH (ref 0–35)
CO2: 26 mEq/L (ref 19–32)
Calcium: 9.5 mg/dL (ref 8.4–10.5)
Chloride: 100 mEq/L (ref 96–112)
Glucose, Bld: 72 mg/dL (ref 70–99)
Sodium: 135 mEq/L (ref 135–145)
Total Protein: 6.7 g/dL (ref 6.0–8.3)

## 2009-06-01 LAB — TSH: TSH: 3.206 u[IU]/mL (ref 0.350–4.500)

## 2009-06-01 LAB — FERRITIN: Ferritin: 107 ng/mL (ref 10–291)

## 2009-06-13 ENCOUNTER — Ambulatory Visit: Payer: Self-pay | Admitting: Oncology

## 2009-06-15 LAB — PROTIME-INR
INR: 3.3 (ref 2.00–3.50)
Protime: 39.6 Seconds — ABNORMAL HIGH (ref 10.6–13.4)

## 2009-06-30 ENCOUNTER — Emergency Department (HOSPITAL_COMMUNITY): Admission: AC | Admit: 2009-06-30 | Discharge: 2009-06-30 | Payer: Self-pay | Admitting: Emergency Medicine

## 2009-07-05 ENCOUNTER — Ambulatory Visit (HOSPITAL_COMMUNITY): Admission: RE | Admit: 2009-07-05 | Discharge: 2009-07-05 | Payer: Self-pay | Admitting: Hematology & Oncology

## 2009-07-05 LAB — PROTIME-INR
INR: 2.7 (ref 2.00–3.50)
Protime: 32.4 Seconds — ABNORMAL HIGH (ref 10.6–13.4)

## 2009-07-06 ENCOUNTER — Emergency Department (HOSPITAL_COMMUNITY): Admission: EM | Admit: 2009-07-06 | Discharge: 2009-07-06 | Payer: Self-pay | Admitting: Emergency Medicine

## 2009-07-06 ENCOUNTER — Telehealth: Payer: Self-pay | Admitting: Internal Medicine

## 2009-07-26 ENCOUNTER — Ambulatory Visit: Payer: Self-pay | Admitting: Hematology & Oncology

## 2009-07-27 ENCOUNTER — Encounter: Payer: Self-pay | Admitting: Internal Medicine

## 2009-07-27 LAB — PROTIME-INR (CHCC SATELLITE): INR: 1.7 — ABNORMAL LOW (ref 2.0–3.5)

## 2009-07-27 LAB — CBC WITH DIFFERENTIAL (CANCER CENTER ONLY)
BASO#: 0 10*3/uL (ref 0.0–0.2)
BASO%: 0.5 % (ref 0.0–2.0)
EOS%: 1.8 % (ref 0.0–7.0)
HCT: 37.8 % (ref 34.8–46.6)
HGB: 12.3 g/dL (ref 11.6–15.9)
LYMPH#: 1.4 10*3/uL (ref 0.9–3.3)
MCHC: 32.5 g/dL (ref 32.0–36.0)
MONO#: 0.3 10*3/uL (ref 0.1–0.9)
NEUT#: 4 10*3/uL (ref 1.5–6.5)
NEUT%: 68.7 % (ref 39.6–80.0)
RDW: 12.5 % (ref 10.5–14.6)
WBC: 5.8 10*3/uL (ref 3.9–10.0)

## 2009-07-27 LAB — COMPREHENSIVE METABOLIC PANEL
ALT: 31 U/L (ref 0–35)
AST: 33 U/L (ref 0–37)
CO2: 29 mEq/L (ref 19–32)
Creatinine, Ser: 0.96 mg/dL (ref 0.40–1.20)
Sodium: 137 mEq/L (ref 135–145)
Total Bilirubin: 0.4 mg/dL (ref 0.3–1.2)
Total Protein: 6.6 g/dL (ref 6.0–8.3)

## 2009-07-27 LAB — VITAMIN D 25 HYDROXY (VIT D DEFICIENCY, FRACTURES): Vit D, 25-Hydroxy: 27 ng/mL — ABNORMAL LOW (ref 30–89)

## 2009-08-07 LAB — PROTIME-INR

## 2009-09-04 ENCOUNTER — Ambulatory Visit: Payer: Self-pay | Admitting: Internal Medicine

## 2009-09-04 LAB — CONVERTED CEMR LAB
ALT: 33 units/L (ref 0–35)
BUN: 25 mg/dL — ABNORMAL HIGH (ref 6–23)
Bilirubin, Direct: 0 mg/dL (ref 0.0–0.3)
Chloride: 102 meq/L (ref 96–112)
GFR calc non Af Amer: 75.77 mL/min (ref >60)
Glucose, Bld: 84 mg/dL (ref 70–99)
HDL: 47.5 mg/dL (ref >39.00)
Potassium: 3.5 meq/L (ref 3.5–5.1)
Total Bilirubin: 0.4 mg/dL (ref 0.3–1.2)

## 2009-09-06 ENCOUNTER — Ambulatory Visit: Payer: Self-pay | Admitting: Hematology & Oncology

## 2009-09-21 ENCOUNTER — Encounter: Payer: Self-pay | Admitting: Internal Medicine

## 2009-09-21 LAB — CBC WITH DIFFERENTIAL (CANCER CENTER ONLY)
BASO#: 0 10*3/uL (ref 0.0–0.2)
EOS%: 1.9 % (ref 0.0–7.0)
Eosinophils Absolute: 0.1 10*3/uL (ref 0.0–0.5)
HCT: 39 % (ref 34.8–46.6)
HGB: 12.8 g/dL (ref 11.6–15.9)
LYMPH%: 27.6 % (ref 14.0–48.0)
MCH: 30.1 pg (ref 26.0–34.0)
MCHC: 32.9 g/dL (ref 32.0–36.0)
MCV: 91 fL (ref 81–101)
MONO%: 5 % (ref 0.0–13.0)
NEUT%: 64.9 % (ref 39.6–80.0)

## 2009-09-21 LAB — COMPREHENSIVE METABOLIC PANEL
Albumin: 4 g/dL (ref 3.5–5.2)
BUN: 21 mg/dL (ref 6–23)
CO2: 24 mEq/L (ref 19–32)
Calcium: 9.3 mg/dL (ref 8.4–10.5)
Chloride: 104 mEq/L (ref 96–112)
Creatinine, Ser: 1.05 mg/dL (ref 0.40–1.20)
Potassium: 3.7 mEq/L (ref 3.5–5.3)

## 2009-10-03 ENCOUNTER — Ambulatory Visit: Payer: Self-pay | Admitting: Internal Medicine

## 2009-10-03 DIAGNOSIS — M25569 Pain in unspecified knee: Secondary | ICD-10-CM

## 2009-10-06 ENCOUNTER — Encounter: Payer: Self-pay | Admitting: Internal Medicine

## 2009-10-12 ENCOUNTER — Ambulatory Visit (HOSPITAL_COMMUNITY): Admission: RE | Admit: 2009-10-12 | Discharge: 2009-10-12 | Payer: Self-pay | Admitting: Hematology & Oncology

## 2009-10-13 ENCOUNTER — Ambulatory Visit: Payer: Self-pay | Admitting: Hematology & Oncology

## 2009-10-16 ENCOUNTER — Encounter: Payer: Self-pay | Admitting: Internal Medicine

## 2009-10-16 LAB — PROTIME-INR (CHCC SATELLITE): INR: 3.3 (ref 2.0–3.5)

## 2009-10-16 LAB — CBC WITH DIFFERENTIAL (CANCER CENTER ONLY)
BASO#: 0.1 10*3/uL (ref 0.0–0.2)
EOS%: 1.7 % (ref 0.0–7.0)
HGB: 13.8 g/dL (ref 11.6–15.9)
LYMPH%: 13.8 % — ABNORMAL LOW (ref 14.0–48.0)
MCH: 29.9 pg (ref 26.0–34.0)
MCHC: 33 g/dL (ref 32.0–36.0)
MCV: 91 fL (ref 81–101)
MONO%: 4.5 % (ref 0.0–13.0)
NEUT#: 7.1 10*3/uL — ABNORMAL HIGH (ref 1.5–6.5)

## 2009-11-20 ENCOUNTER — Ambulatory Visit: Payer: Self-pay | Admitting: Oncology

## 2009-11-20 LAB — PROTIME-INR
INR: 4.2 — ABNORMAL HIGH (ref 2.00–3.50)
Protime: 50.4 Seconds — ABNORMAL HIGH (ref 10.6–13.4)

## 2009-12-22 ENCOUNTER — Ambulatory Visit: Payer: Self-pay | Admitting: Hematology & Oncology

## 2009-12-25 ENCOUNTER — Encounter: Payer: Self-pay | Admitting: Internal Medicine

## 2009-12-25 LAB — PROTIME-INR (CHCC SATELLITE): Protime: 46.8 Seconds — ABNORMAL HIGH (ref 10.6–13.4)

## 2009-12-25 LAB — COMPREHENSIVE METABOLIC PANEL
Albumin: 4.2 g/dL (ref 3.5–5.2)
Alkaline Phosphatase: 88 U/L (ref 39–117)
BUN: 20 mg/dL (ref 6–23)
Glucose, Bld: 88 mg/dL (ref 70–99)
Total Bilirubin: 0.3 mg/dL (ref 0.3–1.2)

## 2009-12-25 LAB — CBC WITH DIFFERENTIAL (CANCER CENTER ONLY)
BASO%: 0.5 % (ref 0.0–2.0)
LYMPH%: 32.9 % (ref 14.0–48.0)
MCH: 30.1 pg (ref 26.0–34.0)
MCV: 90 fL (ref 81–101)
MONO%: 6.1 % (ref 0.0–13.0)
Platelets: 215 10*3/uL (ref 145–400)
RDW: 12.3 % (ref 10.5–14.6)

## 2010-01-10 ENCOUNTER — Ambulatory Visit: Payer: Self-pay | Admitting: Oncology

## 2010-01-10 LAB — PROTIME-INR
INR: 1.8 — ABNORMAL LOW (ref 2.00–3.50)
Protime: 21.6 Seconds — ABNORMAL HIGH (ref 10.6–13.4)

## 2010-01-29 LAB — PROTIME-INR: Protime: 28.8 Seconds — ABNORMAL HIGH (ref 10.6–13.4)

## 2010-02-12 ENCOUNTER — Ambulatory Visit: Payer: Self-pay | Admitting: Internal Medicine

## 2010-02-12 DIAGNOSIS — L089 Local infection of the skin and subcutaneous tissue, unspecified: Secondary | ICD-10-CM | POA: Insufficient documentation

## 2010-02-13 ENCOUNTER — Encounter: Payer: Self-pay | Admitting: Internal Medicine

## 2010-02-13 ENCOUNTER — Telehealth: Payer: Self-pay | Admitting: *Deleted

## 2010-02-14 ENCOUNTER — Ambulatory Visit: Payer: Self-pay | Admitting: Internal Medicine

## 2010-02-14 DIAGNOSIS — H5710 Ocular pain, unspecified eye: Secondary | ICD-10-CM | POA: Insufficient documentation

## 2010-02-20 ENCOUNTER — Ambulatory Visit (HOSPITAL_COMMUNITY)
Admission: RE | Admit: 2010-02-20 | Discharge: 2010-02-20 | Payer: Self-pay | Source: Home / Self Care | Admitting: Hematology & Oncology

## 2010-02-23 ENCOUNTER — Ambulatory Visit: Payer: Self-pay | Admitting: Hematology & Oncology

## 2010-04-20 ENCOUNTER — Encounter
Admission: RE | Admit: 2010-04-20 | Discharge: 2010-04-20 | Payer: Self-pay | Source: Home / Self Care | Attending: Internal Medicine | Admitting: Internal Medicine

## 2010-04-20 LAB — HM MAMMOGRAPHY

## 2010-05-03 IMAGING — CT NM PET TUM IMG INITIAL (PI) SKULL BASE T - THIGH
6 series · 25 of 25 positions shown · IV contrast (350 OM)
Comparison: Abdominal pelvic CT 05/03/2008.  Pelvic CT 03/04/2008.

CLINICAL DATA: Biopsy of right inguinal lymph node demonstrating
adenocarcinoma. History of breast implant and gastric bypass.

NUCLEAR MEDICINE  PET/CT
TECHNIQUE: 15.9 mCi F-18 FDG was injected intravenously via the
right arm.  Full-ring PET imaging was performed from the skull base
through the mid-thighs 73  minutes after injection.  CT data was
obtained and used for attenuation correction and anatomic
localization only.  (This was not acquired as a diagnostic CT
examination.)
Fasting Blood Glucose:  99

[Series 1: pet ac · axial · 3.3mm · 4.69mm/px · z∈[-870,+0]mm · 6 of 267 slices shown]
[im 1/267]
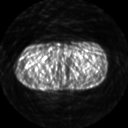
[im 54/267]
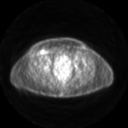
[im 107/267]
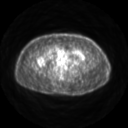
[im 160/267]
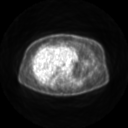
[im 213/267]
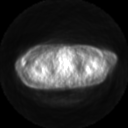
[im 267/267]
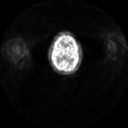

[Series 2: pet nac · axial · 3.3mm · 4.69mm/px · z∈[-870,+0]mm · 7 of 267 slices shown]
[im 1/267]
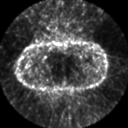
[im 45/267]
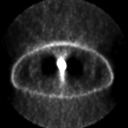
[im 89/267]
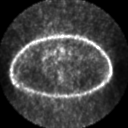
[im 134/267]
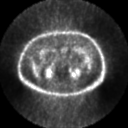
[im 178/267]
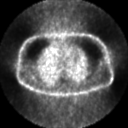
[im 222/267]
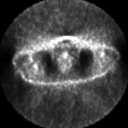
[im 267/267]
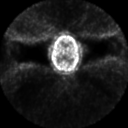

[Series 2: ct images · axial · 3.8mm · 0.98mm/px · z∈[-870,+0]mm · 7 of 267 slices shown]
[im 1/267]
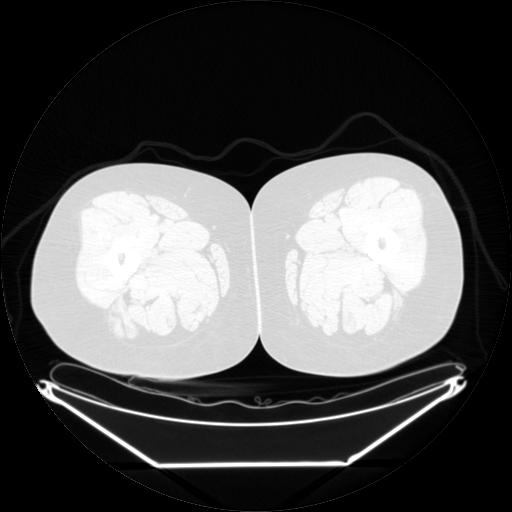
[im 45/267]
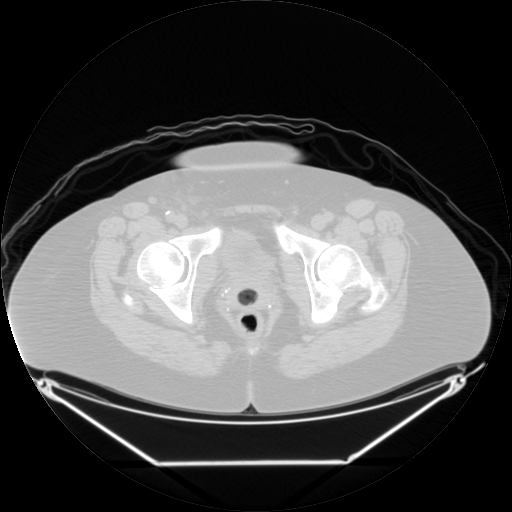
[im 89/267]
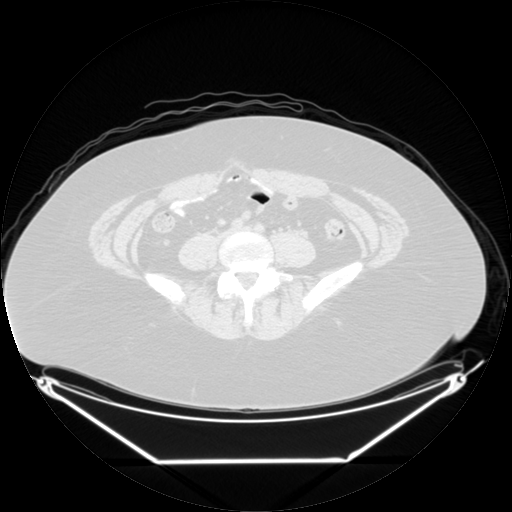
[im 134/267]
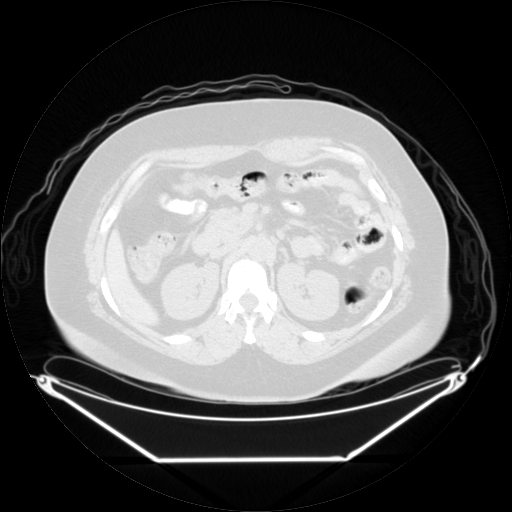
[im 178/267]
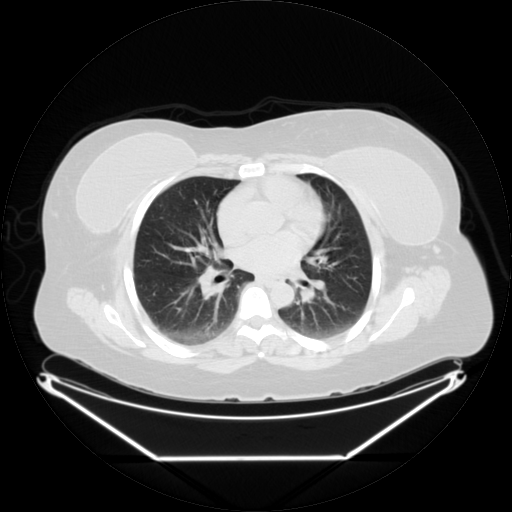
[im 222/267]
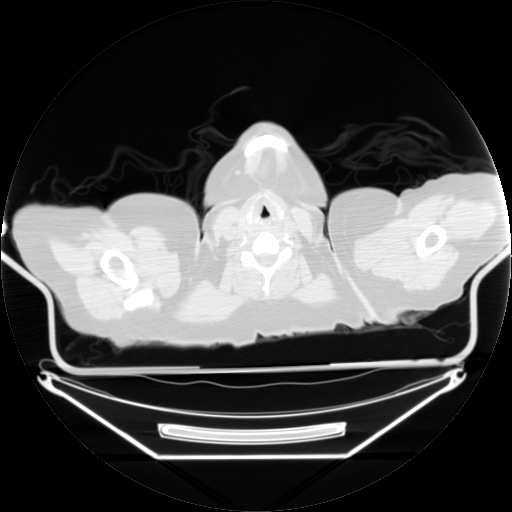
[im 267/267  brain]
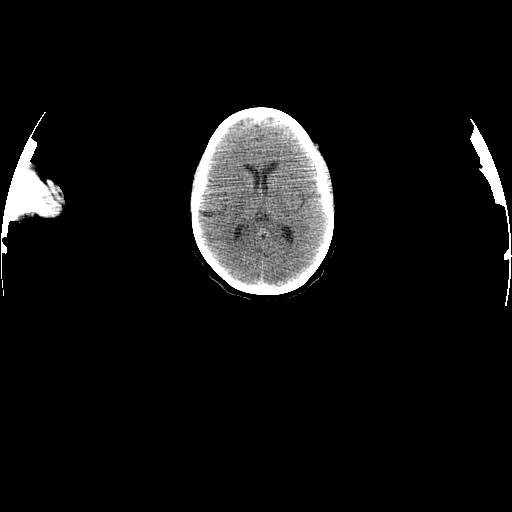

[Series 123: mip · coronal · 3.3mm · 4.69mm/px · 1 of 30 slices shown]
[im 1/30]
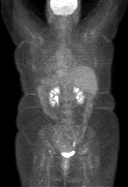

[Series 154: reformatted · coronal · 4.7mm · 6.98mm/px · 2 of 67 slices shown (1 of 2)]
[im 1/67]
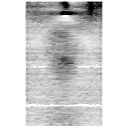
[im 67/67]
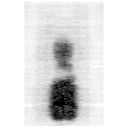

[Series 155: reformatted · coronal · 4.7mm · 6.98mm/px · 2 of 66 slices shown (2 of 2)]
[im 1/66]
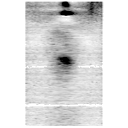
[im 66/66]
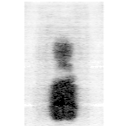

[25 of 25 positions shown; findings below may reference images not displayed]

FINDINGS: PET images demonstrate mild hypermetabolism measuring a
S.U.V. max of 2.8 corresponding to a 8 mm left submandibular node
on image 43.  This is most likely reactive.

Moderate hypermetabolism relatively diffusely involving the
herniated gastric remnant (status post gastric bypass).  This
measures a S.U.V. max of 6.6 on image 105.  No definite underlying
mass identified.

Right inguinal subcutaneous edema and a small amount of gas.  This
is likely the site of prior node resection.  Concurrent
hypermetabolism measuring a S.U.V. max of 6.2 which is likely
postoperative.

A superficial left inguinal node measuring a S.U.V. max of 6.8 and
1.5 x 1.2 cm on image 219.

Heterogeneous hypermetabolism involving the uterus with numerous
masses most likely related to fibroids.

CT images performed for attenuation correction demonstrate  no
significant findings in the neck.  chest findings ovary deferred to
the diagnostic chest CTs dictated separately.  Abdomen pelvis
findings will be deferred to recent diagnostic CTs.  Status post
gastric bypass with herniation of the remnant into the chest.
IMPRESSION: 1.  Hypermetabolic left inguinal lymph node is consistent with
nodal disease.
2.  Hypermetabolism in the right inguinal region is likely related
to recent biopsy/surgery.
3.  No convincing evidence of malignancy elsewhere.
4.  Heterogeneous hypermetabolism involving the uterus felt to be
related to uterine fibroids.

5.  Hypermetabolism at the site of herniated gastric remnant status
post gastric bypass.  This may be physiologic or relate to a
component of distal esophagitis/proximal gastritis.

## 2010-06-03 ENCOUNTER — Encounter: Payer: Self-pay | Admitting: Diagnostic Radiology

## 2010-06-04 ENCOUNTER — Encounter: Payer: Self-pay | Admitting: Hematology & Oncology

## 2010-06-10 LAB — CONVERTED CEMR LAB
Cholesterol: 189 mg/dL (ref 0–200)
HDL: 27.8 mg/dL — ABNORMAL LOW (ref >39.0)
Hemoglobin: 12.1 g/dL
Total CHOL/HDL Ratio: 6.8
Triglycerides: 165 mg/dL — ABNORMAL HIGH (ref 0–149)
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
## 2010-06-12 NOTE — Letter (Signed)
Summary: [name]   Imported By: Laural Benes 08/24/2009 15:07:08  _____________________________________________________________________  External Attachment:    Type:   Image     Comment:   External Document

## 2010-06-12 NOTE — Progress Notes (Signed)
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 Michaelle Birks on Fri.  Need medication to stop crying.  Crying on phone.  Holidays & not wanting to tell parents or sister, have told close friend.  [employer].  NKDA. Initial call taken by: Shelbie Hutching, RN,  May 02, 2008 9:47 AM  Follow-up for Phone Call        I spoke with Dr. Regis Bill and she wants pt to come in. Pt is going to come in now. This is okay with Dr. Regis Bill. Follow-up by: Sherron Monday, [place],  May 02, 2008 10:12 AM

## 2010-06-12 NOTE — Progress Notes (Signed)
Summary: Pt can't afford gen Valtrex and want's gen zovirax  Phone Note Call from Patient Call back at [facility] Phone [phone] Call back at [phone] (cell)   Caller: Patient Summary of Call: Pt was given a rx for valtrex called into [facility]. Pt wants [place] to rx acyclovir  instead because she is having a problem with her Ins and can't afford the generic valtrex.  Initial call taken by: Sherron Monday, CMA (AAMA),  February 13, 2010 12:02 PM  Follow-up for Phone Call        ( acyclovir dose is 800 mg 5 x per day for 7 days  )  RX: 400 mg  take 2     5 x per day for 7 days    disp 70# Follow-up by: Burnis Medin MD,  February 13, 2010 12:49 PM  Additional Follow-up for Phone Call Additional follow up Details #1::        Pt aware and rx sent to [facility]. Spoke with Dr. Regis Bill and she states that we can do 86m 1 tab five times. Left message on machine about this. Additional Follow-up by: SSherron Monday CMA (AAMA),  February 13, 2010 1:19 PM    New/Updated Medications: ACYCLOVIR 800 MG TABS (ACYCLOVIR) 1 by mouth five times a day for 7 days Prescriptions: ACYCLOVIR 800 MG TABS (ACYCLOVIR) 1 by mouth five times a day for 7 days  #35 x 0   Entered by:   SSherron Monday CMA (AAMA)   Authorized by:   WBurnis MedinMD   Signed by:   SSherron Monday CMA (AAMA) on 02/13/2010   Method used:   [facility]        [phone] ([facility]ail)       [address]      [place]  [address]      Ph: [phone]      Fax: [phone]  RxID:   [id number]

## 2010-06-12 NOTE — Letter (Signed)
Summary: [place]   Imported By: Laural Benes 10/20/2009 13:24:08  _____________________________________________________________________  External Attachment:    Type:   Image     Comment:   External Document

## 2010-06-12 NOTE — Letter (Signed)
Summary: [place]   Imported By: Laural Benes 10/24/2009 14:41:59  _____________________________________________________________________  External Attachment:    Type:   Image     Comment:   External Document

## 2010-06-12 NOTE — Assessment & Plan Note (Signed)
Summary: ?mrsa on face/mouth/njr   Vital Signs:  Patient profile:   50 year old female Menstrual status:  hysterectomy Weight:      240 pounds Temp:     98.1 degrees F oral Pulse rate:   72 / minute BP sitting:   118 / 80  (left arm) Cuff size:   large  Vitals Entered By: Sherron Monday, CMA (AAMA) (February 12, 2010 4:07 PM) CC: Pt woke up with a rash on her face that is very painful. Pt also has something going on in the inside of her mouth that is swollen and painful.   History of Present Illness: [name] comes in today  for acute  add on visit for above reason.    1 week ago had a cold  better  yestrerday had chinese  but no se.   Then  awoke this am and hurts face.  and tingles on the right and ? spreading   in upper  lip mouth  used H202 wash  and lotion on fcae   hurts  > spreading  . no fever sweats vision changes . Frind may have a staph infection on his arm  and is being seen today.. Gets fever blisters on lower lip none recently. Also with HT was elevated and then incrase to 2 per dy byt Dr Orion Crook is giving  her potassium supplements also. / no se of meds  Still on coumadin 5 mg    in range   Preventive Screening-Counseling & Management  Alcohol-Tobacco     Alcohol drinks/day: <1     Smoking Status: current     Packs/Day: 1.0  Caffeine-Diet-Exercise     Caffeine use/day: 2     Does Patient Exercise: no     Type of exercise: personal trainer, cardio     Times/week: 5  Current Medications (verified): 1)  Drisdol 50000 Unit  Caps (Ergocalciferol) .Marland Kitchen.. 1 By Mouth Q Week 2)  Ativan 0.5 Mg Tabs (Lorazepam) .Marland Kitchen.. 1 By Mouth Three Times A Day As Needed Anxiety 3)  Lexapro 20 Mg Tabs (Escitalopram Oxalate) .Marland Kitchen.. 1 By Mouth Once Daily 4)  Triamterene-Hctz 37.5-25 Mg Caps (Triamterene-Hctz) .... 2 Once Daily 5)  Zolpidem Tartrate 10 Mg Tabs (Zolpidem Tartrate) .... At Bedtime 6)  Probiotic  Caps (Misc Intestinal Flora Regulat) .... Once Daily 7)   Orphenadrine Citrate Cr 100 Mg Xr12h-Tab (Orphenadrine Citrate) .... 2 By Mouth Once Daily 8)  Lyrica 150 Mg Caps (Pregabalin) .... Two Times A Day 9)  Aricept 10 Mg Tabs (Donepezil Hydrochloride) .... Take 1 Tab Daily 10)  Warfarin Sodium 7.5 Mg Tabs (Warfarin Sodium) .... Once Daily 11)  Chantix Starting Month Pak 0.5 Mg X 11 & 1 Mg X 42  Misc (Varenicline Tartrate) .... 0.42m By Mouth Once Daily For 3 Days, Then Twice Daily For 4 Days and Then 157mBy Mouth 2 Times Daily 12)  Simvastatin 40 Mg Tabs (Simvastatin) ...Marland Kitchen 1 By Mouth Once Daily 13)  Diazepam 2 Mg Tabs (Diazepam) .... As Needed  Allergies (verified): 1)  ! Codeine  Past History:  Past medical, surgical, family and social histories (including risk factors) reviewed, and no changes noted (except as noted below).  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 TsGershon CraneDr. GeCarlean PurlPast Surgical History: Reviewed history from 07/08/2008 and no changes required.  Caesarean section-1991 Breast reduction-2000 Gastric bypass-1979 Tonsillectomy  TAH-BSO 05/2008   Past History:  Care Management: Hematology/Oncology:ennever Neurology: [facility]  Family History: Reviewed history from 05/02/2008 and no changes required. Family History Other cancer-Breast Fam Hx Pulmonary Embolus  mom  Family History High cholesterol        Social History: Reviewed history from 10/03/2009 and no changes required. 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          no change in neck glands   Physical Exam  General:  alert, well-developed, well-nourished, and well-hydrated.    red spots on face right  Head:  normocephalic and atraumatic.   Eyes:  vision grossly intact.   Ears:  R ear normal and L ear normal.   Nose:  no external deformity, no external erythema, and no nasal discharge.   Mouth:  pharynx pink and moist.   upper right gum slightly red no vesicle seen  Neck:  shoddy ac meds  Lungs:  normal respiratory effort and no intercostal retractions.   Heart:  normal rate and regular rhythm.   Pulses:  nl cap refill  Skin:  no petechiae and no purpura.   2 patches or red  on right cheek area with some excoriation   no crusing and no induration or cellulitis   swab surface cultrue taken  Cervical Nodes:  shoccy non tender  Psych:  Oriented X3.     Impression & Recommendations:  Problem # 1:  INFECTION, SKIN AND SOFT TISSUE (ICD-686.9) poss early shingels based on  intensity of signs and location not past midline.   but ? exposure to mrsa    surface culture pending  disc risk benefit of rx for both.  Orders: T-Culture, Wound ([id number])  Problem # 2:  HYPERTENSION (ICD-401.9) increased  per dr Marin Olp  who ha also  added potassium supplements   infuture if no help conisder other   interventions but  she will follow up with him  Her updated medication list for this problem includes:    Triamterene-hctz 37.5-25 Mg Caps (Triamterene-hctz) .Marland Kitchen... 2 once daily  Problem # 3:  COUMADIN THERAPY (ICD-V58.61) stable and no bleeding   Problem # 4:  NEOPLASM, MALIGNANT, UNKNOWN PRIMARY (ICD-199.1) doing well   Complete Medication List: 1)  Drisdol 50000 Unit Caps (Ergocalciferol) .Marland Kitchen.. 1 by mouth q week 2)  Ativan 0.5 Mg Tabs (Lorazepam) .Marland Kitchen.. 1 by mouth three times a day as needed anxiety 3)  Lexapro 20 Mg Tabs (Escitalopram oxalate) .Marland Kitchen.. 1 by mouth once daily 4)  Triamterene-hctz 37.5-25 Mg Caps (Triamterene-hctz) .... 2 once daily 5)   Zolpidem Tartrate 10 Mg Tabs (Zolpidem tartrate) .... At bedtime 6)  Probiotic Caps (Misc intestinal flora regulat) .... Once daily 7)  Orphenadrine Citrate Cr 100 Mg Xr12h-tab (Orphenadrine citrate) .... 2 by mouth once daily 8)  Lyrica 150 Mg Caps (Pregabalin) .... Two times a day 9)  Aricept 10 Mg Tabs (Donepezil hydrochloride) .... Take 1 tab daily 10)  Warfarin Sodium 7.5 Mg Tabs (Warfarin sodium) .... Once daily 11)  Chantix Starting Month Pak 0.5 Mg X 11 & 1 Mg X 42 Misc (Varenicline tartrate) .... 0.64m by mouth once daily  for 3 days, then twice daily for 4 days and then 34m by mouth 2 times daily 12)  Simvastatin 40 Mg Tabs (Simvastatin) ..Marland Kitchen. 1 by mouth once daily 13)  Diazepam 2 Mg Tabs (Diazepam) .... As needed 14)  Valacyclovir Hcl 1 Gm Tabs (Valacyclovir hcl) ..Marland Kitchen. 1 by mouth three times a day for 7 days 15)  Doxycycline Hyclate 100 Mg Tabs (Doxycycline hyclate) ..Marland Kitchen. 1 by mouth  two times a day  Patient Instructions: 1)  take antiviral   for   poss shingles   2)  antibioitc for poss mrsa  3)  will call   with culture result .  4)  doxycycline can  increase  coumadin level. Prescriptions: DOXYCYCLINE HYCLATE 100 MG TABS (DOXYCYCLINE HYCLATE) 1 by mouth  two times a day  #20 x 0   Entered and Authorized by:   WBurnis MedinMD   Signed by:   WBurnis MedinMD on 02/12/2010   Method used:   [facility] [phone] [facility]       [address]      [place]  [address]      Ph: [phone]      Fax: [phone]  RxID:   [id number] HCL 1 GM TABS (VALACYCLOVIR HCL) 1 by mouth three times a day for 7 days  #21 x 0   Entered and Authorized by:   WBurnis MedinMD   Signed by:   WBurnis MedinMD on 02/12/2010   Method used:   [facility] to        [facility] # [phone] (retail)       [address]      [place]  [address]      Ph: [phone]      Fax: [phone]  RxID:   [id number]

## 2010-06-12 NOTE — Letter (Signed)
Summary: [place]   Imported By: Laural Benes 01/22/2010 15:50:35  _____________________________________________________________________  External Attachment:    Type:   Image     Comment:   External Document

## 2010-06-12 NOTE — Letter (Signed)
Summary: [place]   Imported By: Laural Benes 05/19/2009 89:38:10  _____________________________________________________________________  External Attachment:    Type:   Image     Comment:   External Document

## 2010-06-12 NOTE — Letter (Signed)
Summary: [name]   Imported By: Laural Benes 06/30/2009 15:08:33  _____________________________________________________________________  External Attachment:    Type:   Image     Comment:   External Document

## 2010-06-12 NOTE — Assessment & Plan Note (Signed)
Summary: shingles near eye/ssc   Vital Signs:  Patient profile:   50 year old female Menstrual status:  hysterectomy Height:      68 inches Weight:      240 pounds Temp:     98.1 degrees F oral Pulse rate:   72 / minute BP sitting:   120 / 80  (left arm) Cuff size:   large  Vitals Entered By: Sherron Monday, CMA (AAMA) (February 14, 2010 12:22 PM) CC: Pt is having intense pain near Rt eye   History of Present Illness: [name] comes in today  for  worseining of her condition from 2 days ago .  Unable to afford the valtrex so just started on the acyclovir yesterday.  NO fever but achy.  Eye right and below hurts badly like before broke out on lower face .l  no diplopia or tearing but ? vison different laterally .  NO real photophobia.  no eye drops.    taking  doxy and cx results not back.   using some homeopathic ? topical on face not on eye.     Preventive Screening-Counseling & Management  Alcohol-Tobacco     Alcohol drinks/day: <1     Smoking Status: current     Packs/Day: 1.0  Caffeine-Diet-Exercise     Caffeine use/day: 2     Does Patient Exercise: no     Type of exercise: personal trainer, cardio     Times/week: 5  Current Medications (verified): 1)  Drisdol 50000 Unit  Caps (Ergocalciferol) .Marland Kitchen.. 1 By Mouth Q Week 2)  Ativan 0.5 Mg Tabs (Lorazepam) .Marland Kitchen.. 1 By Mouth Three Times A Day As Needed Anxiety 3)  Lexapro 20 Mg Tabs (Escitalopram Oxalate) .Marland Kitchen.. 1 By Mouth Once Daily 4)  Triamterene-Hctz 37.5-25 Mg Caps (Triamterene-Hctz) .... 2 Once Daily 5)  Zolpidem Tartrate 10 Mg Tabs (Zolpidem Tartrate) .... At Bedtime 6)  Probiotic  Caps (Misc Intestinal Flora Regulat) .... Once Daily 7)  Orphenadrine Citrate Cr 100 Mg Xr12h-Tab (Orphenadrine Citrate) .... 2 By Mouth Once Daily 8)  Lyrica 150 Mg Caps (Pregabalin) .... Two Times A Day 9)  Aricept 10 Mg Tabs (Donepezil Hydrochloride) .... Take 1 Tab Daily 10)  Warfarin Sodium 7.5 Mg Tabs (Warfarin Sodium)  .... Once Daily 11)  Chantix Starting Month Pak 0.5 Mg X 11 & 1 Mg X 42  Misc (Varenicline Tartrate) .... 0.78m By Mouth Once Daily For 3 Days, Then Twice Daily For 4 Days and Then 144mBy Mouth 2 Times Daily 12)  Simvastatin 40 Mg Tabs (Simvastatin) ...Marland Kitchen 1 By Mouth Once Daily 13)  Diazepam 2 Mg Tabs (Diazepam) .... As Needed 14)  Acyclovir 800 Mg Tabs (Acyclovir) ...Marland Kitchen 1 By Mouth Five Times A Day For 7 Days 15)  Doxycycline Hyclate 100 Mg Tabs (Doxycycline Hyclate) ...Marland Kitchen 1 By Mouth  Two Times A Day  Allergies (verified): 1)  ! Codeine  Past History:  Past medical, surgical, family and social histories (including risk factors) reviewed, and no changes noted (except as noted below).  Past Medical History: Reviewed history from 02/12/2010 and no changes required. 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 Ts[facility]ershon CraneDr. GeCarlean PurlPast Surgical History: Reviewed history from 07/08/2008 and no changes required. Caesarean section-1991 Breast reduction-2000 Gastric bypass-1979 Tonsillectomy  TAH-BSO 05/2008   Past History:  Care Management: Hematology/Oncology:ennever Neurology: [facility]  [facility]  Family History: Reviewed history from 05/02/2008 and no changes required. Family History Other cancer-Breast Fam Hx Pulmonary Embolus  mom  Family History High cholesterol        Social History: Reviewed history from 10/03/2009 and no changes required. 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      Physical Exam  General:  alert, well-developed, and well-nourished.   in nad  with facial rash  Head:  normocephalic and atraumatic.   Eyes:  vision grossly intact, pupils equal, and pupils round.  eoms seem nl   ? tear no redness .   slightly puffy below eye .  Ears:  R ear normal and L ear normal.   Mouth:  pharynx pink and moist.  sores inside  gum better   right face no change and sligtly swollen near eye but no redness and eoms are normal  Lungs:  normal respiratory effort and no intercostal retractions.   Heart:  normal rate and regular rhythm.   Neurologic:  appears non focal and  face is symmetric  Skin:  no ecchymoses and no petechiae.       brownish  abraded looking rash on right cheek.  no besicle or redness otherwise.  Cervical Nodes:  No lymphadenopathy noted Psych:  Oriented X3, good eye contact, not anxious appearing, and not depressed appearing.     Impression & Recommendations:  Problem # 1:  EYE PAIN, RIGHT (ICD-379.91) Assessment New  new onset with ? lateral fuzziness   uynder rx for facial rash and pain presumeed shingle s although rash is atypical also.   started antiviral just yesterday .    and acyclovir cause of cost of valtrex today .  NO fever   also on doxy cx pending      Problem # 2:  INFECTION, SKIN AND SOFT TISSUE (ICD-686.9) ?  atypical rash on meds for mrsa and  zoster   pain  and districution  reminiscent  or zoster although  rash is atypical .   on antiviral for 1 day.  cx pending   mouth is a bit better   Problem # 3:  NEOPLASM, MALIGNANT, UNKNOWN PRIMARY (ICD-199.1) Assessment: Comment Only  Problem # 4:  COUMADIN THERAPY (ICD-V58.61)  Complete Medication List: 1)  Drisdol 50000 Unit Caps (Ergocalciferol) .Marland Kitchen.. 1 by mouth q week 2)  Ativan 0.5 Mg Tabs (Lorazepam) .Marland Kitchen.. 1 by mouth three times a day as needed anxiety 3)  Lexapro 20 Mg Tabs (Escitalopram oxalate) .Marland Kitchen.. 1 by mouth once daily 4)  Triamterene-hctz 37.5-25 Mg Caps  (Triamterene-hctz) .... 2 once daily 5)  Zolpidem Tartrate 10 Mg Tabs (Zolpidem tartrate) .... At bedtime 6)  Probiotic Caps (Misc intestinal flora regulat) .... Once daily 7)  Orphenadrine Citrate Cr 100 Mg Xr12h-tab (Orphenadrine citrate) .... 2 by mouth once daily 8)  Lyrica 150 Mg Caps (Pregabalin) .... Two times a day 9)  Aricept 10 Mg Tabs (Donepezil hydrochloride) .... Take 1 tab daily 10)  Warfarin Sodium 7.5 Mg Tabs (Warfarin sodium) .... Once daily 11)  Chantix Starting Month Pak 0.5 Mg X 11 & 1 Mg X 42 Misc (Varenicline tartrate) .... 0.40m by mouth  once daily for 3 days, then twice daily for 4 days and then 43m by mouth 2 times daily 12)  Simvastatin 40 Mg Tabs (Simvastatin) ..Marland Kitchen. 1 by mouth once daily 13)  Diazepam 2 Mg Tabs (Diazepam) .... As needed 14)  Acyclovir 800 Mg Tabs (Acyclovir) ..Marland Kitchen. 1 by mouth five times a day for 7 days 15)  Doxycycline Hyclate 100 Mg Tabs (Doxycycline hyclate) ..Marland Kitchen. 1 by mouth  two times a day  Other Orders: Ophthalmology Referral (Ophthalmology)  Patient Instructions: 1)  eye appt today or tomorrow  referral sent . 2)  continue antiviral for now .   3)  disc

## 2010-06-12 NOTE — Letter (Signed)
Summary: [place]   Imported By: Laural Benes 06/02/2009 14:18:44  _____________________________________________________________________  External Attachment:    Type:   Image     Comment:   External Document

## 2010-06-12 NOTE — Letter (Signed)
Summary: [place]   Imported By: Laural Benes 11/15/2009 12:47:34  _____________________________________________________________________  External Attachment:    Type:   Image     Comment:   External Document

## 2010-06-12 NOTE — Assessment & Plan Note (Signed)
Summary: follow up on labs/ssc pt rsc/njr/pt rsc/cjr/pt rsc/cjr   Vital Signs:  Patient profile:   50 year old female Menstrual status:  hysterectomy Height:      68 inches Weight:      246 pounds BMI:     37.54 Temp:     98.4 degrees F oral BP sitting:   120 / 90  (right arm)  Vitals Entered By: Clearnce Sorrel CMA (Oct 03, 2009 2:09 PM) CC: FU Labs / Pain in  RT shoulder down arm to wraist for  1 day / pain in knee 1 week, Hypertension Management     Menstrual Status hysterectomy   History of Present Illness: [name] comesin comes in today  for   above probloems  medication managemnet.  last visit was  10/10  . 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 Had a concussion in February   balance fell   over?   from neuropathy .   Right knee pain and hard to walk and squat over  the past weeks .. no specific  injury but trying to exercise.  NO  hx of popping  or giving out. Pain is  radiating  to posterior knee.  Right hand  right shoulder   bothering her today  with soreness . as been trying to exercise recently .  LIPIDS: taking med without obv se .    and here for follow up of labs   Hypertension History:      She complains of palpitations and syncope, but denies headache, chest pain, dyspnea with exertion, peripheral edema, visual symptoms, neurologic problems, and side effects from treatment.  She notes no problems with any antihypertensive medication side effects.  Syncope when bending over, swelling in Rt knee.        Positive major cardiovascular risk factors include hyperlipidemia, hypertension, and current tobacco user.  Negative major cardiovascular risk factors include female age less than 87 years old and no history of diabetes.     Preventive Screening-Counseling & Management  Alcohol-Tobacco     Alcohol drinks/day: <1     Smoking Status: current     Packs/Day:  1.0  Caffeine-Diet-Exercise     Caffeine use/day: 2     Does Patient Exercise: no  Current Medications (verified): 1)  Drisdol 50000 Unit  Caps (Ergocalciferol) .Marland Kitchen.. 1 By Mouth Q Week 2)  Ativan 0.5 Mg Tabs (Lorazepam) .Marland Kitchen.. 1 By Mouth Three Times A Day As Needed Anxiety 3)  Lexapro 20 Mg Tabs (Escitalopram Oxalate) .Marland Kitchen.. 1 By Mouth Once Daily 4)  Triamterene-Hctz 37.5-25 Mg Caps (Triamterene-Hctz) .... Once Daily 5)  Zolpidem Tartrate 10 Mg Tabs (Zolpidem Tartrate) .... At Bedtime 6)  Probiotic  Caps (Misc Intestinal Flora Regulat) .... Once Daily 7)  Potassium Chloride 20 Meq Pack (Potassium Chloride) .... Once Daily 8)  Orphenadrine Citrate Cr 100 Mg Xr12h-Tab (Orphenadrine Citrate) .... 2 By Mouth Once Daily 9)  Lyrica 150 Mg Caps (Pregabalin) .... Two Times A Day 10)  Aricept 10 Mg Tabs (Donepezil Hydrochloride) .... Take 1 Tab Daily 11)  Warfarin Sodium 7.5 Mg Tabs (Warfarin Sodium) .... Once Daily 12)  Chantix Starting Month Pak 0.5 Mg X 11 & 1 Mg X 42  Misc (Varenicline Tartrate) .... 0.9m By Mouth Once Daily For 3 Days, Then Twice Daily For 4 Days and Then 157mBy Mouth 2 Times Daily 13)  Simvastatin 40 Mg Tabs (Simvastatin) .Marland Kitchen.. 1 By Mouth Once Daily 14)  Diazepam 2 Mg Tabs (Diazepam) .... As Needed  Allergies (verified): 1)  ! Codeine  Past History:  Past medical, surgical, family and social histories (including risk factors) reviewed, and no changes noted (except as noted below).  Past Medical History: Reviewed history from 07/11/2008 and no changes required. 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 Gershon Crane  Dr. Carlean Purl  Past Surgical History: Reviewed history from 07/08/2008 and no changes required. Caesarean section-1991 Breast reduction-2000 Gastric bypass-1979 Tonsillectomy  TAH-BSO 05/2008   Family  History: Reviewed history from 05/02/2008 and no changes required. Family History Other cancer-Breast Fam Hx Pulmonary Embolus  mom  Family History High cholesterol        Social History: Reviewed history from 07/11/2008 and no changes required. 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    Physical Exam  General:  Well-developed,well-nourished,in no acute distress; alert,appropriate and cooperative throughout examination Head:  normocephalic and atraumatic.   Msk:  right knee   lareger than right and posterior pain with ? mild effusino no redness or warth  bilaterall crepitus present.   right shoulder  some pain with elevation right wrist  tender along thumb tendon  grip ok   no redness  or warmth. Pulses:  pulses intact without delay   Extremities:  no clubbing cyanosis or edema  Neurologic:  alert & oriented X3 and gait normal.   Skin:  turgor normal, color normal, no petechiae, and no purpura.   Cervical Nodes:  No lymphadenopathy noted Psych:  Oriented X3, good eye contact, not anxious appearing, and not depressed appearing.     Impression & Recommendations:  Problem # 1:  HYPERLIPIDEMIA (WUJ-811.4) Assessment Improved  except TG are up posss from diet and lack of exercise    counseled about this  Her updated medication list for this problem includes:    Simvastatin 40 Mg Tabs (Simvastatin) .Marland Kitchen... 1 by mouth once daily  Labs Reviewed: SGOT: 28 (09/04/2009)   SGPT: 33 (09/04/2009)  Lipid Goals: Chol Goal: 200 (06/12/2007)   HDL Goal: 40 (06/12/2007)   LDL Goal: 130  (06/12/2007)   TG Goal: 150 (06/12/2007)  10 Yr Risk Heart Disease: 7 % Prior 10 Yr Risk Heart Disease: 11 % (12/15/2007)   HDL:47.50 (09/04/2009), 45.30 (03/01/2009)  LDL:91 (03/22/2008), 129 (12/08/2007)  Chol:179 (09/04/2009), 262 (03/01/2009)  Trig:260.0 (09/04/2009), 171.0 (03/01/2009)  Problem # 2:  KNEE PAIN, RIGHT, ACUTE (ICD-719.46)  new onset  looks like poss sig arthritis  ? recent  derangement  when trying to exercise . co of shoulder and hand that is prob overuse tendinitis.  Her updated medication list for this problem includes:    Orphenadrine Citrate Cr 100 Mg Xr12h-tab (Orphenadrine citrate) .Marland Kitchen... 2 by mouth once daily  Orders: Orthopedic Referral (Ortho)  Problem # 3:  NEOPLASM, MALIGNANT, UNKNOWN PRIMARY (ICD-199.1) poss avarian under rx  with residual neuro signs from chemo?  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  Complete Medication List: 1)  Drisdol 50000 Unit Caps (Ergocalciferol) .Marland Kitchen.. 1 by mouth q week 2)  Ativan 0.5 Mg Tabs (Lorazepam) .Marland Kitchen.. 1 by mouth three times a day as needed anxiety 3)  Lexapro 20 Mg Tabs (Escitalopram oxalate) .Marland Kitchen.. 1 by mouth once daily 4)  Triamterene-hctz 37.5-25 Mg Caps (Triamterene-hctz) .... Once daily 5)  Zolpidem Tartrate 10 Mg Tabs (Zolpidem tartrate) .... At bedtime 6)  Probiotic Caps (Misc intestinal flora regulat) .... Once daily 7)  Potassium Chloride 20 Meq Pack (Potassium chloride) .... Once daily 8)  Orphenadrine Citrate Cr 100 Mg Xr12h-tab (Orphenadrine citrate) .... 2 by mouth once daily 9)  Lyrica 150 Mg Caps (Pregabalin) .... Two times a day 10)  Aricept 10 Mg Tabs (Donepezil hydrochloride) .... Take 1 tab daily 11)  Warfarin Sodium 7.5 Mg Tabs (Warfarin  sodium) .... Once daily 12)  Chantix Starting Month Pak 0.5 Mg X 11 & 1 Mg X 42 Misc (Varenicline tartrate) .... 0.75m by mouth once daily for 3 days, then twice daily for 4 days and then 163mby mouth 2 times daily 13)  Simvastatin 40 Mg Tabs (Simvastatin) ...Marland Kitchen 1 by mouth once daily 14)  Diazepam 2 Mg Tabs (Diazepam) .... As needed  Hypertension Assessment/Plan:      The patient's hypertensive risk group is category B: At least one risk factor (excluding diabetes) with no target organ damage.  Her calculated 10 year risk of coronary heart disease is 7 %.  Today's blood pressure is 120/90.    Patient Instructions: 1)  Rec    ortho consullt     about your right knee . 2)  your LDL is better  3)  Your   triglycerides     are elevated .   can decrease with  exercise and  limit sugars and animal fats.  4)  water exercise is better  for your joints.  5)  Rov in  6 months or as needed. greater than 50% of visit spent in counseling  25 minutes

## 2010-06-26 ENCOUNTER — Other Ambulatory Visit: Payer: Self-pay | Admitting: Hematology & Oncology

## 2010-06-26 ENCOUNTER — Encounter (HOSPITAL_BASED_OUTPATIENT_CLINIC_OR_DEPARTMENT_OTHER): Payer: Managed Care, Other (non HMO) | Admitting: Hematology & Oncology

## 2010-06-26 DIAGNOSIS — C801 Malignant (primary) neoplasm, unspecified: Secondary | ICD-10-CM

## 2010-06-26 DIAGNOSIS — C569 Malignant neoplasm of unspecified ovary: Secondary | ICD-10-CM

## 2010-06-26 DIAGNOSIS — Z7901 Long term (current) use of anticoagulants: Secondary | ICD-10-CM

## 2010-06-26 DIAGNOSIS — Z86718 Personal history of other venous thrombosis and embolism: Secondary | ICD-10-CM

## 2010-06-26 LAB — COMPREHENSIVE METABOLIC PANEL
Albumin: 3.9 g/dL (ref 3.5–5.2)
Alkaline Phosphatase: 87 U/L (ref 39–117)
BUN: 18 mg/dL (ref 6–23)
CO2: 25 mEq/L (ref 19–32)
Calcium: 8.8 mg/dL (ref 8.4–10.5)
Glucose, Bld: 149 mg/dL — ABNORMAL HIGH (ref 70–99)
Potassium: 3.2 mEq/L — ABNORMAL LOW (ref 3.5–5.3)
Sodium: 139 mEq/L (ref 135–145)
Total Protein: 6 g/dL (ref 6.0–8.3)

## 2010-06-26 LAB — CBC WITH DIFFERENTIAL (CANCER CENTER ONLY)
BASO#: 0 10*3/uL (ref 0.0–0.2)
EOS%: 1.8 % (ref 0.0–7.0)
HCT: 37.8 % (ref 34.8–46.6)
HGB: 12.5 g/dL (ref 11.6–15.9)
LYMPH%: 28.1 % (ref 14.0–48.0)
MCH: 29 pg (ref 26.0–34.0)
MCHC: 33.2 g/dL (ref 32.0–36.0)
MCV: 87 fL (ref 81–101)
MONO%: 6.3 % (ref 0.0–13.0)
NEUT%: 63.4 % (ref 39.6–80.0)

## 2010-06-26 LAB — FERRITIN: Ferritin: 11 ng/mL (ref 10–291)

## 2010-06-26 LAB — VITAMIN D 25 HYDROXY (VIT D DEFICIENCY, FRACTURES): Vit D, 25-Hydroxy: 35 ng/mL (ref 30–89)

## 2010-06-26 LAB — IRON AND TIBC
Iron: 44 ug/dL (ref 42–145)
UIBC: 296 ug/dL

## 2010-06-28 ENCOUNTER — Emergency Department (HOSPITAL_COMMUNITY): Payer: Managed Care, Other (non HMO)

## 2010-06-28 ENCOUNTER — Emergency Department (HOSPITAL_COMMUNITY)
Admission: EM | Admit: 2010-06-28 | Discharge: 2010-06-29 | Disposition: A | Discharge status: Home / Self Care | Payer: Managed Care, Other (non HMO) | Attending: Emergency Medicine | Admitting: Emergency Medicine

## 2010-06-28 DIAGNOSIS — Z79899 Other long term (current) drug therapy: Secondary | ICD-10-CM | POA: Insufficient documentation

## 2010-06-28 DIAGNOSIS — M25519 Pain in unspecified shoulder: Secondary | ICD-10-CM | POA: Insufficient documentation

## 2010-06-28 DIAGNOSIS — R0602 Shortness of breath: Secondary | ICD-10-CM | POA: Insufficient documentation

## 2010-06-28 DIAGNOSIS — Z7901 Long term (current) use of anticoagulants: Secondary | ICD-10-CM | POA: Insufficient documentation

## 2010-06-28 DIAGNOSIS — Z86711 Personal history of pulmonary embolism: Secondary | ICD-10-CM | POA: Insufficient documentation

## 2010-06-28 DIAGNOSIS — R599 Enlarged lymph nodes, unspecified: Secondary | ICD-10-CM | POA: Insufficient documentation

## 2010-06-28 DIAGNOSIS — I10 Essential (primary) hypertension: Secondary | ICD-10-CM | POA: Insufficient documentation

## 2010-06-28 DIAGNOSIS — E039 Hypothyroidism, unspecified: Secondary | ICD-10-CM | POA: Insufficient documentation

## 2010-06-28 DIAGNOSIS — Z8543 Personal history of malignant neoplasm of ovary: Secondary | ICD-10-CM | POA: Insufficient documentation

## 2010-06-28 DIAGNOSIS — M79609 Pain in unspecified limb: Secondary | ICD-10-CM | POA: Insufficient documentation

## 2010-06-28 HISTORY — DX: Malignant neoplasm of unspecified ovary: C56.9

## 2010-06-28 HISTORY — DX: Essential (primary) hypertension: I10

## 2010-06-28 LAB — DIFFERENTIAL
Basophils Relative: 0 % (ref 0–1)
Eosinophils Relative: 2 % (ref 0–5)
Monocytes Absolute: 0.4 10*3/uL (ref 0.1–1.0)
Monocytes Relative: 7 % (ref 3–12)
Neutro Abs: 2.8 10*3/uL (ref 1.7–7.7)

## 2010-06-28 LAB — CBC
HCT: 36.7 % (ref 36.0–46.0)
Hemoglobin: 12 g/dL (ref 12.0–15.0)
MCH: 28.4 pg (ref 26.0–34.0)
MCHC: 32.7 g/dL (ref 30.0–36.0)

## 2010-06-28 LAB — POCT I-STAT, CHEM 8
BUN: 15 mg/dL (ref 6–23)
Calcium, Ion: 1.1 mmol/L — ABNORMAL LOW (ref 1.12–1.32)
Creatinine, Ser: 1.1 mg/dL (ref 0.4–1.2)
Glucose, Bld: 100 mg/dL — ABNORMAL HIGH (ref 70–99)
Sodium: 141 mEq/L (ref 135–145)
TCO2: 26 mmol/L (ref 0–100)

## 2010-06-28 LAB — PROTIME-INR
INR: 1.95 — ABNORMAL HIGH (ref 0.00–1.49)
Prothrombin Time: 22.4 seconds — ABNORMAL HIGH (ref 11.6–15.2)

## 2010-06-28 LAB — POCT CARDIAC MARKERS

## 2010-06-28 LAB — D-DIMER, QUANTITATIVE: D-Dimer, Quant: 0.26 ug/mL-FEU (ref 0.00–0.48)

## 2010-06-29 ENCOUNTER — Encounter (HOSPITAL_COMMUNITY): Payer: Self-pay | Admitting: Radiology

## 2010-06-29 ENCOUNTER — Other Ambulatory Visit: Payer: Self-pay | Admitting: Hematology & Oncology

## 2010-06-29 DIAGNOSIS — M542 Cervicalgia: Secondary | ICD-10-CM

## 2010-06-29 DIAGNOSIS — R52 Pain, unspecified: Secondary | ICD-10-CM

## 2010-06-29 MED ORDER — IOHEXOL 350 MG/ML SOLN
100.0000 mL | Freq: Once | INTRAVENOUS | Status: AC | PRN
  Administered 2010-06-29: 100 mL via INTRAVENOUS

## 2010-06-30 ENCOUNTER — Ambulatory Visit (HOSPITAL_BASED_OUTPATIENT_CLINIC_OR_DEPARTMENT_OTHER)
Admission: RE | Admit: 2010-06-30 | Discharge: 2010-06-30 | Disposition: A | Discharge status: Home / Self Care | Payer: Managed Care, Other (non HMO) | Source: Ambulatory Visit | Attending: Hematology & Oncology | Admitting: Hematology & Oncology

## 2010-06-30 DIAGNOSIS — R52 Pain, unspecified: Secondary | ICD-10-CM

## 2010-06-30 DIAGNOSIS — Z8543 Personal history of malignant neoplasm of ovary: Secondary | ICD-10-CM

## 2010-06-30 DIAGNOSIS — R51 Headache: Secondary | ICD-10-CM

## 2010-06-30 DIAGNOSIS — M79609 Pain in unspecified limb: Secondary | ICD-10-CM | POA: Insufficient documentation

## 2010-06-30 DIAGNOSIS — M5030 Other cervical disc degeneration, unspecified cervical region: Secondary | ICD-10-CM | POA: Insufficient documentation

## 2010-06-30 DIAGNOSIS — R209 Unspecified disturbances of skin sensation: Secondary | ICD-10-CM

## 2010-06-30 DIAGNOSIS — M5020 Other cervical disc displacement, unspecified cervical region: Secondary | ICD-10-CM | POA: Insufficient documentation

## 2010-06-30 MED ORDER — GADOBENATE DIMEGLUMINE 529 MG/ML IV SOLN: 20.0000 mL | Freq: Once | INTRAVENOUS | Status: AC | PRN

## 2010-07-02 ENCOUNTER — Encounter (HOSPITAL_BASED_OUTPATIENT_CLINIC_OR_DEPARTMENT_OTHER): Payer: Managed Care, Other (non HMO) | Admitting: Hematology & Oncology

## 2010-07-02 ENCOUNTER — Other Ambulatory Visit: Payer: Self-pay | Admitting: Hematology & Oncology

## 2010-07-02 DIAGNOSIS — C801 Malignant (primary) neoplasm, unspecified: Secondary | ICD-10-CM

## 2010-07-02 DIAGNOSIS — I2699 Other pulmonary embolism without acute cor pulmonale: Secondary | ICD-10-CM

## 2010-07-02 DIAGNOSIS — M25512 Pain in left shoulder: Secondary | ICD-10-CM

## 2010-07-02 DIAGNOSIS — G589 Mononeuropathy, unspecified: Secondary | ICD-10-CM

## 2010-07-02 DIAGNOSIS — Z7901 Long term (current) use of anticoagulants: Secondary | ICD-10-CM

## 2010-07-04 ENCOUNTER — Other Ambulatory Visit (HOSPITAL_BASED_OUTPATIENT_CLINIC_OR_DEPARTMENT_OTHER): Payer: Managed Care, Other (non HMO)

## 2010-07-04 ENCOUNTER — Ambulatory Visit (HOSPITAL_BASED_OUTPATIENT_CLINIC_OR_DEPARTMENT_OTHER)
Admission: RE | Admit: 2010-07-04 | Discharge: 2010-07-04 | Disposition: A | Discharge status: Home / Self Care | Payer: Managed Care, Other (non HMO) | Source: Ambulatory Visit | Attending: Hematology & Oncology | Admitting: Hematology & Oncology

## 2010-07-04 DIAGNOSIS — M75100 Unspecified rotator cuff tear or rupture of unspecified shoulder, not specified as traumatic: Secondary | ICD-10-CM

## 2010-07-04 DIAGNOSIS — M67919 Unspecified disorder of synovium and tendon, unspecified shoulder: Secondary | ICD-10-CM | POA: Insufficient documentation

## 2010-07-04 DIAGNOSIS — M249 Joint derangement, unspecified: Secondary | ICD-10-CM

## 2010-07-04 DIAGNOSIS — M25519 Pain in unspecified shoulder: Secondary | ICD-10-CM | POA: Insufficient documentation

## 2010-07-04 DIAGNOSIS — M719 Bursopathy, unspecified: Secondary | ICD-10-CM | POA: Insufficient documentation

## 2010-07-04 DIAGNOSIS — M25512 Pain in left shoulder: Secondary | ICD-10-CM

## 2010-07-06 ENCOUNTER — Encounter (HOSPITAL_COMMUNITY): Payer: Self-pay

## 2010-07-06 ENCOUNTER — Encounter (HOSPITAL_COMMUNITY)
Admission: RE | Admit: 2010-07-06 | Discharge: 2010-07-06 | Disposition: A | Discharge status: Home / Self Care | Payer: Managed Care, Other (non HMO) | Source: Ambulatory Visit | Attending: Hematology & Oncology | Admitting: Hematology & Oncology

## 2010-07-06 DIAGNOSIS — K449 Diaphragmatic hernia without obstruction or gangrene: Secondary | ICD-10-CM | POA: Insufficient documentation

## 2010-07-06 DIAGNOSIS — C569 Malignant neoplasm of unspecified ovary: Secondary | ICD-10-CM

## 2010-07-06 MED ORDER — FLUDEOXYGLUCOSE F - 18 (FDG) INJECTION
16.6000 | Freq: Once | INTRAVENOUS | Status: AC | PRN
  Administered 2010-07-06: 16.6 via INTRAVENOUS

## 2010-07-10 ENCOUNTER — Other Ambulatory Visit (HOSPITAL_COMMUNITY): Payer: Managed Care, Other (non HMO)

## 2010-07-30 LAB — GLUCOSE, CAPILLARY: Glucose-Capillary: 71 mg/dL (ref 70–99)

## 2010-08-01 LAB — COMPREHENSIVE METABOLIC PANEL
AST: 32 U/L (ref 0–37)
Albumin: 3.9 g/dL (ref 3.5–5.2)
Calcium: 9.1 mg/dL (ref 8.4–10.5)
Chloride: 97 mEq/L (ref 96–112)
Creatinine, Ser: 0.86 mg/dL (ref 0.4–1.2)
GFR calc Af Amer: >60 mL/min (ref >60)
Total Bilirubin: 0.7 mg/dL (ref 0.3–1.2)

## 2010-08-01 LAB — POCT I-STAT, CHEM 8
BUN: 19 mg/dL (ref 6–23)
Chloride: 103 mEq/L (ref 96–112)
Creatinine, Ser: 0.9 mg/dL (ref 0.4–1.2)
Glucose, Bld: 74 mg/dL (ref 70–99)
Glucose, Bld: 79 mg/dL (ref 70–99)
HCT: 45 % (ref 36.0–46.0)
Hemoglobin: 15.3 g/dL — ABNORMAL HIGH (ref 12.0–15.0)
Potassium: 3.5 mEq/L (ref 3.5–5.1)
Potassium: 3.7 mEq/L (ref 3.5–5.1)
Sodium: 134 mEq/L — ABNORMAL LOW (ref 135–145)

## 2010-08-01 LAB — CBC
MCV: 93.1 fL (ref 78.0–100.0)
Platelets: 195 10*3/uL (ref 150–400)
WBC: 6.3 10*3/uL (ref 4.0–10.5)

## 2010-08-01 LAB — DIFFERENTIAL
Eosinophils Relative: 1 % (ref 0–5)
Lymphocytes Relative: 24 % (ref 12–46)
Lymphs Abs: 1.5 10*3/uL (ref 0.7–4.0)
Monocytes Absolute: 0.4 10*3/uL (ref 0.1–1.0)
Monocytes Relative: 7 % (ref 3–12)

## 2010-08-01 LAB — PROTIME-INR
INR: 3.4 — ABNORMAL HIGH (ref 0.00–1.49)
Prothrombin Time: 34.1 seconds — ABNORMAL HIGH (ref 11.6–15.2)

## 2010-08-14 ENCOUNTER — Other Ambulatory Visit: Payer: Self-pay | Admitting: Internal Medicine

## 2010-08-15 LAB — GLUCOSE, CAPILLARY

## 2010-08-18 LAB — GLUCOSE, CAPILLARY
Glucose-Capillary: 56 mg/dL — ABNORMAL LOW (ref 70–99)
Glucose-Capillary: 95 mg/dL (ref 70–99)

## 2010-08-20 LAB — DIFFERENTIAL
Basophils Absolute: 0 10*3/uL (ref 0.0–0.1)
Eosinophils Absolute: 0 10*3/uL (ref 0.0–0.7)
Eosinophils Relative: 0 % (ref 0–5)
Lymphocytes Relative: 36 % (ref 12–46)
Lymphs Abs: 1.1 10*3/uL (ref 0.7–4.0)
Lymphs Abs: 1.1 10*3/uL (ref 0.7–4.0)
Monocytes Absolute: 0.2 10*3/uL (ref 0.1–1.0)
Neutro Abs: 1.7 10*3/uL (ref 1.7–7.7)
Neutrophils Relative %: 54 % (ref 43–77)
Neutrophils Relative %: 57 % (ref 43–77)

## 2010-08-20 LAB — CBC
HCT: 22.6 % — ABNORMAL LOW (ref 36.0–46.0)
HCT: 27 % — ABNORMAL LOW (ref 36.0–46.0)
MCHC: 33.5 g/dL (ref 30.0–36.0)
MCHC: 33.9 g/dL (ref 30.0–36.0)
MCV: 100.5 fL — ABNORMAL HIGH (ref 78.0–100.0)
MCV: 95.6 fL (ref 78.0–100.0)
Platelets: 61 10*3/uL — ABNORMAL LOW (ref 150–400)
RBC: 2.25 MIL/uL — ABNORMAL LOW (ref 3.87–5.11)
WBC: 3 10*3/uL — ABNORMAL LOW (ref 4.0–10.5)

## 2010-08-20 LAB — COMPREHENSIVE METABOLIC PANEL
AST: 31 U/L (ref 0–37)
BUN: 38 mg/dL — ABNORMAL HIGH (ref 6–23)
CO2: 30 mEq/L (ref 19–32)
Calcium: 9.6 mg/dL (ref 8.4–10.5)
Creatinine, Ser: 1.09 mg/dL (ref 0.4–1.2)
GFR calc Af Amer: >60 mL/min (ref >60)
GFR calc non Af Amer: 54 mL/min — ABNORMAL LOW (ref >60)
Total Bilirubin: 0.6 mg/dL (ref 0.3–1.2)

## 2010-08-20 LAB — CROSSMATCH

## 2010-08-20 LAB — CULTURE, BLOOD (ROUTINE X 2): Culture: NO GROWTH

## 2010-08-20 LAB — BASIC METABOLIC PANEL
BUN: 33 mg/dL — ABNORMAL HIGH (ref 6–23)
CO2: 30 mEq/L (ref 19–32)
Chloride: 101 mEq/L (ref 96–112)
Creatinine, Ser: 1.02 mg/dL (ref 0.4–1.2)
Potassium: 3.4 mEq/L — ABNORMAL LOW (ref 3.5–5.1)

## 2010-08-20 LAB — PROTIME-INR
INR: 1 (ref 0.00–1.49)
Prothrombin Time: 13.1 seconds (ref 11.6–15.2)

## 2010-08-20 LAB — URINALYSIS, ROUTINE W REFLEX MICROSCOPIC
Bilirubin Urine: NEGATIVE
Glucose, UA: NEGATIVE mg/dL
Ketones, ur: NEGATIVE mg/dL
Nitrite: NEGATIVE
Protein, ur: NEGATIVE mg/dL

## 2010-08-20 LAB — GLUCOSE, CAPILLARY: Glucose-Capillary: 97 mg/dL (ref 70–99)

## 2010-08-21 ENCOUNTER — Other Ambulatory Visit (INDEPENDENT_AMBULATORY_CARE_PROVIDER_SITE_OTHER): Payer: Managed Care, Other (non HMO) | Admitting: Internal Medicine

## 2010-08-21 DIAGNOSIS — Z0000 Encounter for general adult medical examination without abnormal findings: Secondary | ICD-10-CM

## 2010-08-21 LAB — POCT URINALYSIS DIPSTICK
Nitrite, UA: NEGATIVE
Protein, UA: NEGATIVE
Spec Grav, UA: 1.02
Urobilinogen, UA: 1
pH, UA: 7

## 2010-08-21 LAB — CBC WITH DIFFERENTIAL/PLATELET
Basophils Relative: 0.3 % (ref 0.0–3.0)
Eosinophils Absolute: 0 10*3/uL (ref 0.0–0.7)
Lymphocytes Relative: 27.9 % (ref 12.0–46.0)
MCHC: 33.6 g/dL (ref 30.0–36.0)
MCV: 89.8 fl (ref 78.0–100.0)
Monocytes Absolute: 0.2 10*3/uL (ref 0.1–1.0)
Neutrophils Relative %: 65.6 % (ref 43.0–77.0)
Platelets: 184 10*3/uL (ref 150.0–400.0)
RBC: 4.49 Mil/uL (ref 3.87–5.11)
WBC: 4.2 10*3/uL — ABNORMAL LOW (ref 4.5–10.5)

## 2010-08-21 LAB — BASIC METABOLIC PANEL
BUN: 18 mg/dL (ref 6–23)
Calcium: 9.3 mg/dL (ref 8.4–10.5)
Creatinine, Ser: 1 mg/dL (ref 0.4–1.2)

## 2010-08-21 LAB — LIPID PANEL
HDL: 42.9 mg/dL (ref >39.00)
LDL Cholesterol: 88 mg/dL (ref 0–99)
Total CHOL/HDL Ratio: 4
Triglycerides: 132 mg/dL (ref 0.0–149.0)
VLDL: 26.4 mg/dL (ref 0.0–40.0)

## 2010-08-21 LAB — HEPATIC FUNCTION PANEL
Alkaline Phosphatase: 78 U/L (ref 39–117)
Bilirubin, Direct: 0.1 mg/dL (ref 0.0–0.3)
Total Bilirubin: 0.4 mg/dL (ref 0.3–1.2)
Total Protein: 6.3 g/dL (ref 6.0–8.3)

## 2010-08-21 LAB — TSH: TSH: 1.47 u[IU]/mL (ref 0.35–5.50)

## 2010-08-23 ENCOUNTER — Encounter: Payer: Self-pay | Admitting: Internal Medicine

## 2010-08-23 LAB — CBC
HCT: 27.3 % — ABNORMAL LOW (ref 36.0–46.0)
MCHC: 33.3 g/dL (ref 30.0–36.0)
MCV: 87.3 fL (ref 78.0–100.0)
Platelets: 19 10*3/uL — CL (ref 150–400)
RDW: 17.9 % — ABNORMAL HIGH (ref 11.5–15.5)
WBC: 2.5 10*3/uL — ABNORMAL LOW (ref 4.0–10.5)

## 2010-08-27 LAB — URINALYSIS, ROUTINE W REFLEX MICROSCOPIC
Glucose, UA: NEGATIVE mg/dL
Ketones, ur: NEGATIVE mg/dL
pH: 6 (ref 5.0–8.0)

## 2010-08-27 LAB — DIFFERENTIAL
Basophils Absolute: 0.1 10*3/uL (ref 0.0–0.1)
Basophils Relative: 1 % (ref 0–1)
Basophils Relative: 0 % (ref 0–1)
Eosinophils Absolute: 0.9 10*3/uL — ABNORMAL HIGH (ref 0.0–0.7)
Eosinophils Absolute: 0.8 10*3/uL — ABNORMAL HIGH (ref 0.0–0.7)
Monocytes Relative: 0 % — ABNORMAL LOW (ref 3–12)
Neutro Abs: 6.7 10*3/uL (ref 1.7–7.7)
Neutro Abs: 4.4 10*3/uL (ref 1.7–7.7)
Neutrophils Relative %: 76 % (ref 43–77)
Neutrophils Relative %: 70 % (ref 43–77)

## 2010-08-27 LAB — COMPREHENSIVE METABOLIC PANEL
ALT: 21 U/L (ref 0–35)
Alkaline Phosphatase: 62 U/L (ref 39–117)
BUN: 13 mg/dL (ref 6–23)
Chloride: 101 mEq/L (ref 96–112)
Glucose, Bld: 90 mg/dL (ref 70–99)
Potassium: 3 mEq/L — ABNORMAL LOW (ref 3.5–5.1)
Sodium: 137 mEq/L (ref 135–145)
Total Bilirubin: 0.8 mg/dL (ref 0.3–1.2)
Total Protein: 6.3 g/dL (ref 6.0–8.3)

## 2010-08-27 LAB — POCT I-STAT, CHEM 8
Chloride: 98 mEq/L (ref 96–112)
Glucose, Bld: 118 mg/dL — ABNORMAL HIGH (ref 70–99)
HCT: 47 % — ABNORMAL HIGH (ref 36.0–46.0)
Hemoglobin: 16 g/dL — ABNORMAL HIGH (ref 12.0–15.0)
Potassium: 3.5 mEq/L (ref 3.5–5.1)
Sodium: 135 mEq/L (ref 135–145)

## 2010-08-27 LAB — CBC
HCT: 43.3 % (ref 36.0–46.0)
MCHC: 32.8 g/dL (ref 30.0–36.0)
MCHC: 33 g/dL (ref 30.0–36.0)
MCV: 87.5 fL (ref 78.0–100.0)
MCV: 87.9 fL (ref 78.0–100.0)
Platelets: 193 10*3/uL (ref 150–400)
Platelets: 288 10*3/uL (ref 150–400)
Platelets: 277 10*3/uL (ref 150–400)
RDW: 14.8 % (ref 11.5–15.5)
RDW: 14.7 % (ref 11.5–15.5)
WBC: 6.3 10*3/uL (ref 4.0–10.5)

## 2010-08-27 LAB — PROTIME-INR
INR: 1 (ref 0.00–1.49)
Prothrombin Time: 13.5 seconds (ref 11.6–15.2)

## 2010-08-27 LAB — BASIC METABOLIC PANEL
BUN: 8 mg/dL (ref 6–23)
BUN: 9 mg/dL (ref 6–23)
Calcium: 7.8 mg/dL — ABNORMAL LOW (ref 8.4–10.5)
Creatinine, Ser: 0.77 mg/dL (ref 0.4–1.2)
GFR calc Af Amer: >60 mL/min (ref >60)
GFR calc non Af Amer: >60 mL/min (ref >60)
GFR calc non Af Amer: >60 mL/min (ref >60)
Glucose, Bld: 115 mg/dL — ABNORMAL HIGH (ref 70–99)
Potassium: 4 mEq/L (ref 3.5–5.1)
Sodium: 136 mEq/L (ref 135–145)

## 2010-08-27 LAB — CULTURE, BLOOD (ROUTINE X 2)
Culture: NO GROWTH
Culture: NO GROWTH

## 2010-08-27 LAB — TYPE AND SCREEN: Antibody Screen: NEGATIVE

## 2010-08-27 LAB — HEMOGLOBIN AND HEMATOCRIT, BLOOD: Hemoglobin: 14 g/dL (ref 12.0–15.0)

## 2010-08-27 LAB — WOUND CULTURE

## 2010-08-27 LAB — URINE MICROSCOPIC-ADD ON

## 2010-08-28 ENCOUNTER — Ambulatory Visit: Payer: Self-pay | Admitting: Internal Medicine

## 2010-09-13 ENCOUNTER — Other Ambulatory Visit: Payer: Self-pay | Admitting: Internal Medicine

## 2010-09-25 NOTE — H&P (Signed)
[name], [name]            ACCOUNT NO.:  [id number]   MEDICAL RECORD NO.:  [id number]          PATIENT TYPE:  INP   LOCATION:  76                         FACILITY:  [facility]   PHYSICIAN:  Firas N. Shadad        DATE OF BIRTH:  1961/03/25   DATE OF ADMISSION:  10/12/2008  DATE OF DISCHARGE:                              HISTORY & PHYSICAL   CHIEF COMPLAINT/REASON FOR ADMISSION:  Weakness, dehydration presyncopal  episode and anemia.   HISTORY OF PRESENT ILLNESS:  This is a pleasant a 50 year old female  with really no significant past medical history other than chronic iron  deficiency anemia due to uterine fibroid, had been given IV iron in the  past by Dr. Marin Olp; however, was recently diagnosed in 2009, after he  presented with a right groin mass and found to be biopsy-proven poorly  differentiated carcinoma without any obvious origin and felt that maybe  in all likelihood a GYN source.  Patient treated with systemic  chemotherapy under the care of Dr. Marin Olp with about 5 to 6 cycles  reportedly with carboplatin and Abraxane.  She did report that her last  chemotherapy around mid May, about 2 weeks ago, which would been cycle  6, was treated without the Abraxane due to worsening peripheral  neuropathy for which she had been Lyrica that has been titrated up to  150 mg b.i.d.  The patient, however, reportedly in the last 2 weeks has  reported progressive weakness, sluggishness, fatigue and also dizziness  and presyncopal episodes and feeling passing out which has gotten worse  today, and while she was at a doctor's office the blood pressure was  checked and showed this to be systolic at 70.  Patient subsequently was  seen and referred to the emergency department and upon evaluation there  her blood pressure was around 53-97 systolic; however, after IV  hydration blood pressure did go up appropriately to around 673  systolically, but it was also noted that she was hypokalemic,  had a  potassium of 2.5.  She also was significantly anemic, had a hemoglobin  of 7.6 and felt the patient was still having syncopal episode and almost  blacking out episodes at times when she got up and I was asked to  evaluate and possibly admit [name] for observation overnight.  Upon  interviewing [name], she was feeling a lot better; however, felt a  little bit sluggish and again had that as a presyncopal episode.  Did  not report any chest pain or abdominal pain.  Did not report any  hematochezia, melena, any genitourinary complaints, any bloody  hemoptysis or hematemesis.  Did not report any cough, did not report any  difficulty breathing.  Did have some organic weakness but no vomiting.   PAST MEDICAL HISTORY:  As mentioned, showed:  1. 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  2. Has history remotely with PE, hyperlipidemia, hypertension as well      as a history of peripheral neuropathy.   ALLERGIES:  None.   MEDICATIONS:  She  is currently on Lyrica, Lexapro, Vicodin, Ativan and  Maxzide and Vitamin D.   SOCIAL HISTORY:  She has a remote history of tobacco and alcohol abuse.  Works in Systems analyst.   FAMILY HISTORY:  Unremarkable.   PHYSICAL EXAMINATION:  An alert, awake female.  Appeared really in  minimum to no distress.  VITALS:  Showed a systolic blood pressure about 130-140, heart rate was  80, she was afebrile and respiratory rate of 16.  HEAD:  Normocephalic, atraumatic.  Pupils equal, round, reactive light.  Oral mucosa is moist and pink.  NECK:  Supple, no lymphadenopathy.  HEART:  Regular rate and rhythm, S1-S2.  LUNGS:  Clear to auscultation, no rhonchi or wheeze.  Dullness to  percussion.  ABDOMEN:  Soft, nontender.  No hepatosplenomegaly.  EXTREMITIES:  No edema.   LABORATORY DATA:  Showed a hemoglobin of 7.6, platelet count of 75,  white cell count 3.1, potassium was 2.5.  Sodium of 134, BUN is 38,   creatinine of 1.09.   IMPRESSION:  A 50 year old female with the following issues:  1. Hypotension.  2. Anemia.  3. Status post chemotherapy with carboplatin alone recently.  4. Worsening anemia.  5. Weakness and presyncopal episode.  6. Increased BUN, creatinine and indicating prerenal dehydration.  7. Peripheral neuropathy.  8. Hypokalemia.  9. History of pulmonary embolus.   PLAN:  To admit him for observation to Dr. Antonieta Pert service for type  and cross and transfuse her 2 units and give her potassium  supplementation as well as IV hydration overnight.  Also will continue  Lyrica and hold her blood pressure medication, and again anticipate  discharge in the next 24-48 hours.           ______________________________  Mathis Dad [facility]  Electronically Signed     FNS/MEDQ  D:  10/12/2008  T:  10/12/2008  Job:  [id number]   cc:   Standley Brooking. Regis Bill, MD  [place], [place] [address]   Marti Sleigh, M.D.  [address]  [place]  [place] [address]   Carl E. Gessner, MD,FACG  [facility]  [address] [place], [place] [address]

## 2010-09-25 NOTE — Consult Note (Signed)
[name], [name]            ACCOUNT NO.:  [id number]   MEDICAL RECORD NO.:  [id number]          PATIENT TYPE:  OUT   LOCATION:  GYN                          FACILITY:  [facility]   PHYSICIAN:  John T. Clarene Essex, M.D.    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  She had special stains that suggested possible ovarian or  primary peritoneal origin.  Baseline CA-125 values were negative.  She  underwent diagnostic laparoscopy in January with negative findings and  subsequent TLH/BSO and left inguinal lymph node resection on January 12.  Final pathology revealed metastatic poorly differentiated carcinoma  involving the left inguinal node and histologically negative ovaries,  tubes, endometrium and cervix.  She has had [name] left lymphocyst that has  been drained on two occasions with negative cytology.  Last drainage was  on February 2.  She has completed [name] couple of cycles of chemotherapy and  is due for treatment next week.  She has some discomfort related to [employer].  The patient returns for postop evaluation.   EXAM:  VITAL SIGNS:  Stable and afebrile as in  office chart.  ABDOMEN:  Examination of the abdomen reveals well-healed laparoscopic  trocar sites.  Right groin incision is well-healed with no lymphocyst.  There is [name] 4 cm lymphocyst in the left groin.  SKIN:  Has changes related to chemotherapy.  EXTREMITIES:  Have no cords, Homans' or edema.  PELVIC:  Speculum examination reveals [name] healing cuff.  On bimanual and  rectovaginal examinations, minimal induration of the cuff.   PROCEDURE NOTE:  After informed consent, the lymphocyst in the left  groin was prepped and drained with [name] #14 Angiocath.  The site was  dressed.   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  Recurrent left groin lymphocyst.   PLAN:  The patient will continue chemotherapy management by Dr. Marin Olp.  At this time, she is cleared for all activity related to surgery.  We  would be glad to see the patient back at any time on [name] p.r.n. basis.  Should she have [name] lymphocyst that recurs, I would strongly consider VIR  drainage and/or sclerosis.  Obviously, this should be scheduled by Dr.  Marin Olp to coincide with chemotherapy and cytopenia as related to  chemotherapy.      John T. Clarene Essex, M.D.  Electronically Signed     JTS/MEDQ  D:  07/06/2008  T:  07/06/2008  Job:  [id number]   cc:   Standley Brooking. Regis Bill, MD  [address] [place], [place] [address]   Imogene Burn. Georgette Dover, M.D.  [place]. Marin Olp, M.D.  Fax: [phone]   Caswell Corwin, R.N.  [address], [place] [address]

## 2010-09-25 NOTE — Consult Note (Signed)
[name], [name]            ACCOUNT NO.:  [id number]   MEDICAL RECORD NO.:  [id number]          PATIENT TYPE:  OUT   LOCATION:  GYN                          FACILITY:  [facility]   PHYSICIAN:  [name], M.D.DATE OF BIRTH:  12-29-1960   DATE OF CONSULTATION:  DATE OF DISCHARGE:                                 CONSULTATION   CHIEF COMPLAINT:  Adenocarcinoma in the right inguinal lymph nodes.   HISTORY OF PRESENT ILLNESS:  A 50 year old [place] American female seen  in consultation regarding a search for primary site of adenocarcinoma  initially discovered in the inguinal region.  The patient discovered a  mass in her right inguinal region at time of shaving.  Subsequently,  this mass has undergone surgical excision and it is felt to represent an  adenocarcinoma with special stains consistent with ovarian or adnexal  primary malignancy.  Subsequently, the patient has had an uncomplicated  postoperative course.  She did have a CT scan obtained March 04, 2008,  and another one obtained May 03, 2008.  Most recent CT scan shows  postoperative changes in the right groin with a small seroma, normal  ovaries, no other pelvic adenopathy, and a globular enlarged uterus with  internal calcified probable fibroids.  The patient herself feels well.  She denies any GI or GU symptoms, has no pelvic pain, pressure, abnormal  vaginal bleeding, or discharge.  Patient claims she has regular cyclic  menses which previously were heavy but underwent a uterine artery  embolization in December of 2006 at which time she had considerable  improvement of her menorrhagia.   Patient has had serial Pap smears by Dr. Delila [name] which are normal.  She has had biopsies of the cervix on 1 occasion that were negative.  She has previously undergone a D and C for menorrhagia.   PAST MEDICAL HISTORY:  Medical Illnesses:  1. Obesity.  2. Anemia (resolved).  3. Hyperlipidemia.  4.  Hypertension.   PAST SURGICAL HISTORY:  1. D and C for menorrhagia.  2. Uterine artery embolization, 2006.  3. Breast biopsy, May of 2008.  4. Gastric bypass procedure when some 30 years ago.  That procedure      was complicated by a postoperative pulmonary embolus.  The patient      has recently undergone a thrombophilia workup by her primary      hematologist, Dr. Burney [name].  Results of those studies are not      available at the present time.  5. Patient has also had a skin cancer excised from her left      forehead.  She says this is not a melanoma.   CURRENT MEDICATIONS:  1. Vitamin D.  2. Simvastatin.  3. Lexapro.  4. Maxzide.  5. Ativan p.r.n.   DRUG ALLERGIES:  NONE.   FAMILY HISTORY:  Maternal grandmother with breast cancer.   SOCIAL HISTORY:  The patient is married.  She comes accompanied by her  husband today.  She does not smoke.   PHYSICAL EXAM:  Height 5 feet 10.  Weight 228 pounds.  GENERAL:  Patient is a pleasant,  healthy, African American female in no  acute distress.  HEENT:  Negative.  NECK:  Supple without thyromegaly.  There was no supraclavicular or  inguinal adenopathy.  The right inguinal incision is healing well.  There is some thickening beneath the incision.  No discrete adenopathy  is identified.  ABDOMEN:  Soft, nontender.  No masses, organomegaly, ascites, or hernias  are noted.  PELVIC EXAM:  EG/BUS, vagina, bladder, urethra are normal.  Cervix  appears normal.  Patient is having her menstrual period at the moment.  Uterus is slightly irregular, difficult to outline because of patient's  obesity but there does seem to be a fibroid in the lower uterine segment  anteriorly.  This measures approximately 2 cm.  No adnexal masses noted.  Rectovaginal exam confirms.   IMPRESSION:  Adenocarcinoma initially diagnosed in the right inguinal  lymph node.  We are currently searching for a primary lesion.   From a gynecologic point of view,  malignancy in the ovary or endometrium  could certainly metastasize to the inguinal nodes.  The CT scan suggests  normal ovaries but a slightly enlarged uterus.  Therefore, I attempted  to perform an endometrial biopsy.  However, I was only able to pass the  aspiration device approximately 3 cm and the patient had extreme pain  and I felt that I was not in the endometrial cavity.   RECOMMENDATION:  The patient is scheduled to have a PET scan tomorrow.  If this does not reveal a primary lesion, then I would recommend that we  prove as to whether she does or does not have a gynecologic malignancy  by performing a fractional D and C and a diagnostic laparoscopy.  This  will be scheduled with Dr. Ned [name] for early January.  In the  meantime, if her PET suggests other abnormalities, we may change the  plans for laparoscopy and D and C.   It is also noted the patient's CA-125 value is 8.6 units/mL.   We will go ahead and plan on surgical procedure as outlined above and  hold the possibility of canceling it.        [name], M.D.  Electronically Signed     DC/MEDQ  D:  05/10/2008  T:  05/11/2008  Job:  [id number]   cc:   [name], M.D.  [address] [place] [address] [place] [address]  [name], R.N.  [phone] [place], [place] [address]   [name], M.D.  Fax: [phone]   [name], M.D.  Fax: [phone]

## 2010-09-25 NOTE — Discharge Summary (Signed)
[name], [name]            ACCOUNT NO.:  [id number]   MEDICAL RECORD NO.:  [id number]          PATIENT TYPE:  INP   LOCATION:  9305                          FACILITY:  [facility]   PHYSICIAN:  Agnes Lawrence, M.D.DATE OF BIRTH:  [birth year]   DATE OF ADMISSION:  06/04/2008  DATE OF DISCHARGE:  06/06/2008                               DISCHARGE SUMMARY   CHIEF COMPLAINT:  The patient is a 50 year old, approximately 1 week  status postdate TAH-BSO complaining of drainage from the incision.   HISTORY OF PRESENT ILLNESS:  Please see the above.  The patient has an  undifferentiated cancer.  The primary organ system of origin is unknown.  She is currently receiving chemotherapy.   PAST MEDICAL HISTORY:  Please see the above.   SOCIAL HISTORY:  She is a current smoker.  She denies any substance  abuse or alcohol use.   ALLERGIES:  No known drug allergies.   LABORATORY DATA:  White blood cell count 8.7, hemoglobin 14.2, and  platelets 288,000.  Electrolytes normal.  A CT of the pelvis, no pelvic  collections noted.   HOSPITAL COURSE:  The patient was started on Zosyn in the ED and given  parenteral Dilaudid for pain control.  She was then transferred to  [facility] for further care.  The wound was then explored at the  bedside after infiltrating the incision with 2% lidocaine.  The fascia  was intact.  There was sharp debridement of necrotic areas in the  subcutaneous fat layer.  The wound was then packed.  A wound VAC was  subsequently placed.  Other workup included both wound cultures and  blood cultures with no growth to date at the time of discharge.  She  remained afebrile.  She was discharged to home on January 25 with a  wound VAC sponge in place.   DISCHARGE DIAGNOSIS:  Superficial wound separation.   CONDITION:  Stable.   DIET:  Regular.   ACTIVITY:  Modified bed rest.   MEDICATIONS:  Continue the Percocet, Zofran, Lexapro, Ativan,  promethazine, Lovenox,  triamterene, hydrochlorothiazide, potassium  chloride, vitamins E, and d-alpha.  In addition, a prescription for  Levaquin was given at the time of discharge.   DISPOSITION:  The patient was to follow up in the GYN Oncology Office on  February 4 at 1:30 p.m.      Agnes Lawrence, M.D.  Electronically Signed     LAJ/MEDQ  D:  06/28/2008  T:  06/28/2008  Job:  [id number]   cc:   Caswell Corwin, R.N.  [address], [place] [address]

## 2010-09-25 NOTE — Consult Note (Signed)
[name], [name]            ACCOUNT NO.:  [id number]   MEDICAL RECORD NO.:  [id number]          PATIENT TYPE:  OUT   LOCATION:  GYN                          FACILITY:  [facility]   PHYSICIAN:  [name] A. 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  She had an excisional biopsy for  right groin that was consistent with a questionable primary of GYN  issue.  She subsequently underwent diagnostic laparoscopy on May 17, 2008, and underwent peritoneal biopsies in the anterior bladder, right  pelvic, right gutter, left pelvic, left gutter, posterior peritoneum and  biopsy of a left ovarian nodule and a right fallopian tube nodule with  abdominal/pelvic washings which were all completely negative.  Due to  her tumor being questionably consistent with a primary GYN issue, the  patient opted for definitive diagnostic therapy and underwent a TAH/BSO  and removal of left inguinal lymph node.  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  Within the cervix,  uterus, myometrium, uterine serosa, right and left fallopian tubes,  there was no evidence of any malignant or metastatic disease.  Cytokeratin staining was positive for estrogen receptor, progesterone  receptor, WT1, cytokeratin 7, cytokeratin 34 and cytokeratin 8.  The  morphologic and immunohistochemical findings were not definitive as to a  primary site of origin.  Primary sites include urogenital tract, lung  and breast.  Immunophenotype also included ovary as a possible tumor  site.  However, both ovaries and fallopian tubes were totally submitted  with no evidence of malignancy.  Catheterized urine specimen revealed no  malignant cells.  The patient had a superficial wound separation and had  a  wound VAC placed.  She comes in today for postoperative wound check.  She is overall doing fairly well.  She is due for her second cycle of  chemotherapy next week.  She is currently under the care of Dr. Marin [name],  receiving paclitaxel and carboplatin.  She has questions regarding  undergoing a colonoscopy.  She is complaining of several areas of lumps  in her abdomen.   PHYSICAL EXAMINATION:  GENERAL:  Well-nourished, well-developed female  in no acute distress.  ABDOMEN:  The infraumbilical incision is well healed.  Suture was  removed.  She had a transverse Pfannenstiel skin incision with very  superficial skin separation.  The skin edges are fairly well  approximated.  There is 1 area measuring approximately 1.5 cm in width  and less than 1 cm in depth that was probed and is well healed.  There  is approximately a 4 cm fluctuant fluid-type collection to the left  aspect of the incision.  The patient would very much like that drained.  After obtaining her verbal consent, the area was cleansed with Betadine  cleaning solution and alcohol.  One mL of 1% lidocaine was injected.  Approximately 14 mL of the clear amber solution  was aspiration.  The  remainder of the incision is consistent with postoperative induration.  She may have a small lymphocele on the right aspect of her groin  incision.  The area is nontender.   ASSESSMENT:  22. A 50 year old, status post total abdominal hysterectomy and      bilateral salpingo-oophorectomy after undergoing diagnostic      laparoscopy for adenocarcinoma of unknown primary.  She is      currently due for chemotherapy this coming Tuesday and will follow      up with Dr. Marin [name].  I discussed with her that proceeding with      colonoscopy would depend on Dr. Antonieta [name] recommendations.      Oftentimes during chemotherapy, we do not want to do colonoscopies      as you can become bacteremic with colonoscopy, and if she were to      nadir her counts at  that time, it may be deleterious to her, but I      would defer to his opinion on this.  2. We will send the fluid we drained from the left groin to cytology      and follow up on the results and notify her.  3. I do not believe that she needs the wound vacuum-assisted closure      any longer.  She can just do wet-to-dry dressing changes on the      inferior incision.      [name] A. Alycia Rossetti, MD  Electronically Signed     PAG/MEDQ  D:  06/14/2008  T:  06/14/2008  Job:  [id number]   cc:   [name], M.D.  [address] [place] [address] [place] [address]  [name], R.N.  [phone] [address], [place] [address]   [name] [name] Bill, MD  [place], [place] [address]   [name], M.D.  Fax: [place]. [name], M.D.  Fax: [phone]

## 2010-09-25 NOTE — Op Note (Signed)
[name], [name]            ACCOUNT NO.:  [id number]   MEDICAL RECORD NO.:  [id number]          PATIENT TYPE:  AMB   LOCATION:  DAY                          FACILITY:  [facility]   PHYSICIAN:  Paola A. Alycia Rossetti, MD    DATE OF BIRTH:  05/17/1960   DATE OF PROCEDURE:  05/17/2008  DATE OF DISCHARGE:                               OPERATIVE REPORT   PREOPERATIVE DIAGNOSIS:  Adenocarcinoma of unknown primary, favor GYN.   POSTOPERATIVE DIAGNOSIS:  Adenocarcinoma of unknown primary, favor GYN.   PROCEDURE:  Diagnostic laparoscopy.  Random peritoneal biopsies,  abdominal pelvic washings, dilation and curettage.   SURGEON:  Paola A. Alycia Rossetti, MD   ASSISTANT:  Caswell Corwin, R.N.   ANESTHESIA:  General.   ANESTHESIOLOGIST:  Everlean Cherry. Delma Post, M.D.   ESTIMATED BLOOD LOSS:  25 mL.   URINE OUTPUT:  75 mL.   IV FLUIDS:  2300 ounces.   SPECIMENS:  Random peritoneal biopsies including right and left gutter,  right and left pelvic sidewall, anterior and posterior peritoneum.  Washings and endometrial curettings were submitted to pathology.   COMPLICATIONS:  None.   OPERATIVE FINDINGS:  Included a normal-appearing vulva.  There are no  visible lesions in the vagina.  The cervix was normal in appearance and  nulliparous.  There is no discharge.  Uterus sounded to 8 cm.  There is  a minimal amount of endometrial curettings.  On abdominal pelvic  evaluation, there was no evidence of any peritoneal disease.  The  uterus, ovaries, anterior and posterior cul-de-sac, all peritoneal  surfaces, omentum, liver edge, all appeared within normal limits with no  evidence of carcinomatosis.   The patient was taken to the operating room and placed in supine  position where her arms were tucked while she was awake to her comfort  level.  All appropriate precautions were taken.  General anesthesia was  then induced.  She was then placed in dorsal lithotomy position with  SCDs and again, all appropriate  precautions.  Time-out was performed to  confirm the patient, procedure, antibiotic status, allergies and  surgeons.  The abdomen was prepped in the usual fashion.  The perineum  and vagina were prepped in usual sterile fashion.  Foley catheter was  inserted in the bladder under sterile conditions.  Time-out again as  above was performed.  Sterile speculum was placed into the vagina.  The  anterior lip of the cervix was grasped with a single-tooth tenaculum.  The uterus was sounded to 8 cm.  It was dilated without difficulty.  Endometrial curettage was performed.  There was a small amount of  material.  The uterine manipulator was then placed in the uterus.  We  then prepped and draped the patient.  We then draped the patient  appropriately.  A horizontal infraumbilical incision was made with the  knife.  Using the OptiVu into the abdominal pelvic cavity, a survey as  above was performed.  Bilateral 5-mm ports were placed under direct  visualization.  We attempted to place a 5 mm suprapubic but due to the  patient's habitus, it continued tracking.  Therefore,  this port site was  aborted.  Abdominopelvic washings were obtained.  We then obtained a  right pelvic, right gutter, left pelvic, left gutter.  Anterior  peritoneum and posterior cul-de-sac biopsies were performed by grasping  the peritoneum, tenting it and removing it with sharp dissection.  Hemostasis was obtained using pinpoint cautery.  A series of eight  pictures were taken of the abdominal pelvic surfaces and the ovaries.  There was one small 2 mm nodule just at the utero-ovarian ligament.  This was removed off the patient's left side.  There was a very small 2-  3 mm small nodule on the right fallopian tube that was similarly  resected and removed.  All areas were noted to be hemostatic.  Again,  the survey was repeated of the abdomen.  The liver edge was smooth.  There was no nodularity, carcinomatosis, miliary disease along  any of  the peritoneal surfaces that we could identify.  The stomach was  adherent to the anterior abdominal wall.  The inferior portion of the  surface of the stomach looked unremarkable.  All small bowel surfaces  were unremarkable.  Evaluation was somewhat limited secondary to the  patient's habitus and significant intra-abdominal adiposity.  After  ensuring that all peritoneal biopsy sites were hemostatic, the ports  were removed.  The CO2 gas was removed from the patient's abdomen.   The 56/15 infraumbilical port site was reinforced.  The fascia was  reinforced with a figure-of-eight suture of 0 Vicryl.  The skin was  closed using 4-0 Vicryl.  Steri-Strips and Benzoin were applied.  The  uterine manipulator, Zumi and Foley were removed.  After it was removed,  a repeat D and C was performed as it was felt that the [place] was somewhat  deeper than the curettings.  Again, minimal amount of tissue was  obtained.   The patient tolerated the procedure well and was taken to the recovery  room in stable condition.  All instrument, needle, Ray-Tec counts were  correct x2.      Paola A. Alycia Rossetti, MD  Electronically Signed     PAG/MEDQ  D:  05/17/2008  T:  05/17/2008  Job:  [id number]   cc:   Caswell Corwin, R.N.  [address], [place] [address]   Imogene Burn. Georgette Dover, M.D.  [place]. Mezer, M.D.  Fax: [place]. Marin Olp, M.D.  Fax: [phone]

## 2010-09-25 NOTE — Discharge Summary (Signed)
[name], [name]            ACCOUNT NO.:  [id number]   MEDICAL RECORD NO.:  [id number]          PATIENT TYPE:  INP   LOCATION:  48                         FACILITY:  [facility]   PHYSICIAN:  Firas N. Shadad        DATE OF BIRTH:  01-14-1961   DATE OF ADMISSION:  10/12/2008  DATE OF DISCHARGE:  10/13/2008                               DISCHARGE SUMMARY   ADMISSION DIAGNOSES:  1. Poorly differentiated carcinoma of unknown primary.  2. Hypotension.  3. Anemia.  4. Weakness, dehydration due to chemotherapy.  5. Hypokalemia.  6. Pancytopenia.   DISCHARGE DIAGNOSES:  1. Poorly differentiated carcinoma of unknown primary.  2. Hypotension, resolved.  3. Anemia, resolved.  4. Weakness, dehydration due to chemotherapy.  5. Hypokalemia, resolved.  6. Pancytopenia.   HISTORY OF PRESENT ILLNESS:  This is a 50 year old female with a history  of poorly differentiated tumor that was felt that there may be a gyn  etiology that presented with an inguinal lymph node.  For more details  see please the dictated history and physical.  The patient is status  post carboplatin and Abraxane.  The patient presented to the emergency  department on October 12, 2008, with symptoms of weakness, fatigue and  hypotension and anemia and was found to have a hemoglobin of 7.6, a BUN  of 38 and a blood pressure systolically of 70.   HOSPITAL COURSE:  The patient had a rather brief stay.  She was treated  with intravenous normal saline overnight and her blood pressure  systolically was over 250 all throughout her stay.  She was also  received 2 units of packed red cell transfusion and her hemoglobin  responded nicely from 7.6 to 9.2.  She was also found to be hypokalemic  at her presentation to the emergency department on June 2; her potassium  at that time was 2.5.  The patient received IV potassium supplementation  and the day of discharge her potassium was normal at 3.4.  Also her BUN  was at 38 and has  dropped down to 33.  The patient's symptoms of  dizziness and presyncopal episode have resolved at this time and felt  well enough to be discharged home.   GENERAL:  So her evaluation on the day of discharge she was awake and  alert, not in any distress.  VITAL SIGNS:  Her vitals showed blood pressure of 102/60, heart rate is  74 and regular, temperature is 97.7, her respiration 20 and satting 98%  on room air.  HEENT:  Head is normocephalic, atraumatic.  Pupils equal, round,  reactive to light.  Oral mucosa is moist and pink.  NECK:  Supple.  HEART:  Regular rate, S1-S2.  LUNGS:  Lungs are clear.  ABDOMEN:  Soft.  EXTREMITIES:  No edema.   LABORATORY DATA:  Showed a hemoglobin of 9.2, white cell count 3.0,  platelet count of 61, as mentioned potassium was 3.4, creatinine at  1.02.   STUDIES:  She did have a CT scan chest, abdomen and pelvis with a PET  portion the results of which are currently  pending.   DISCHARGE INSTRUCTIONS:  Would be follow up with Dr. Marin Olp next week  to discuss the results of the scan.   DISCHARGE CONDITION:  Is good.   DISCHARGE MEDICATION:  1. Lyrica 150 mg p.o. b.i.d.  2. Lexapro 10 mg p.o. daily.  3. Coumadin 5 mg p.o. daily.  4. Ativan 0.5 mg p.o. t.i.d. p.r.n.  5. Vicodin ES 1 tablet p.o. q.6 hours p.r.n.  6. Her blood pressure medication to be held for the time being and to      be managed by her primary care doctor as an outpatient basis.           ______________________________  [name] [facility]  Electronically Signed     FNS/MEDQ  D:  10/13/2008  T:  10/13/2008  Job:  [id number]

## 2010-09-25 NOTE — Op Note (Signed)
[name], [name]            ACCOUNT NO.:  [id number]   MEDICAL RECORD NO.:  [id number]          PATIENT TYPE:  INP   LOCATION:                               [facility]AC[facility]   PHYSICIAN:  Marti Sleigh, M.D.DATE OF BIRTH:  Dec 28, 1960   DATE OF PROCEDURE:  05/24/2008  DATE OF DISCHARGE:                               OPERATIVE REPORT   PREOPERATIVE DIAGNOSIS:  Adenocarcinoma in left inguinal lymph node with  questionable primary site.   POSTOPERATIVE DIAGNOSES:  Adenocarcinoma in left inguinal lymph node  with questionable primary site with uterine fibroids.   PROCEDURE:  Total abdominal surgery, bilateral salpingo-oophorectomy,  and removal of enlarged left inguinal lymph nodes.   SURGEON:  Marti Sleigh, M.D.   FIRST ASSISTANT:  Agnes Lawrence, M.D., Caswell Corwin, R.N.   ANESTHESIA:  General with orotracheal tube.   ESTIMATED BLOOD LOSS:  150 mL.   SURGICAL FINDINGS:  The patient had a 2-cm firm left inguinal lymph node  which was easily removed.  The uterus was enlarged approximately [redacted]  weeks gestational size, symmetrically enlarged.  The tubes and ovaries  appeared normal except for a follicular cyst and a corpus luteum.  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  There was no adenopathy in the  pelvis or aortic chain.   PROCEDURE:  The patient was brought to the operating room and after  satisfactory attainment of general anesthesia was placed in modified  lithotomy position in [place].  The anterior abdominal wall,  perineum and vagina were prepped, Foley catheter was inserted, and the  patient was draped.  The abdomen was entered through a Pfannenstiel  incision.  The subcutaneous dissection was carried down to the left  inguinal region and a palpably enlarged lymph node was removed using  cautery and sharp and blunt dissection.  This was submitted to  pathology.  The Bookwalter retractor was positioned and bowel was packed  out of the pelvis.  The uterus was grasped with long Kelly clamps and  the round ligaments were divided.  The retroperitoneal spaces were  opened and no adenopathy was identified.  The ureter was identified and  then the ovarian vessels were skeletonized, clamped, cut, free tied and  suture ligated proximally so as to entirely remove the uterus, the tubes  and ovaries.  The bladder flap was advanced with sharp dissection.  The  uterine vessels were skeletonized, clamped, cut and suture ligated.  The  paracervical and cardinal ligaments were clamped, cut and suture  ligated.  The rectovaginal space was opened and the vaginal angles were  crossclamped along with the uterosacral ligaments.  The vagina was  transected from its connection to the cervix.  The cervix was inspected  and found to be removed in its entirety.  The uterus, tubes and ovaries  were handed off of the operative field and sent to pathology for  permanent resectioning.  The vaginal angles were reapproximated with a  transfixion suture and the central  portion of vagina closed with  interrupted figure-of-eight sutures of 0 Vicryl.  Pelvis was  reinspected, the pelvic sidewall was repalpated, and no other adenopathy  was noted.  The pelvis was irrigated and then the packs and retractors  removed.  The anterior abdominal wall was closed in layers, the first  being a running suture of 2-0 Vicryl to reapproximate the peritoneum.  The area of dissection of the inguinal region was closed with 2  interrupted sutures of 2-0 Vicryl.  The fascia was closed with a running  suture of #1 PDS.  The subcutaneous tissue was irrigated.  Hemostasis  was achieved with cautery.  Skin was reapproximated with staples.  A  dressing was applied.  The patient was awakened from anesthesia and  taken to the recovery  room in satisfactory condition.  Sponge, needle  and instrument counts correct x2.      Marti Sleigh, M.D.  Electronically Signed     DC/MEDQ  D:  05/24/2008  T:  05/24/2008  Job:  [id number]   cc:   Rudell Cobb. Marin Olp, M.D.  Fax: [phone]   Agnes Lawrence, M.D.  Fax: [phone]   Caswell Corwin, R.N.  [address], [place] [address]   [place] Regis Bill, MD  [address] [place], [place] [address]

## 2010-09-25 NOTE — Discharge Summary (Signed)
NAMEASHTAN, [name]            ACCOUNT NO.:  [id number]   MEDICAL RECORD NO.:  [id number]          PATIENT TYPE:  INP   LOCATION:  1524                         FACILITY:  [facility]   PHYSICIAN:  Agnes Lawrence, M.D.DATE OF BIRTH:  09-May-1961   DATE OF ADMISSION:  05/24/2008  DATE OF DISCHARGE:  05/27/2008                               DISCHARGE SUMMARY   CHIEF COMPLAINT:  The patient is a 50 year old who presents for further  operative staging of an adenocarcinoma.  Please see the dictated history  and physical.   HOSPITAL COURSE:  The patient underwent a total abdominal hysterectomy  and bilateral salpingo-oophorectomy and left inguinal lymph node biopsy.  Please see the dictated operative summary.  On postoperative day #1, her  hemoglobin was 12.6 and she was mildly hypokalemic.  This was repleted.  Her diet was gradually advanced.  She was also seen by Medical Oncology  on postoperative day #2, the preliminary pathology report at this point  failed to demonstrate cancer in the ovaries, tubes or uterus.  At this  point, it was felt that her cancer with undifferentiated in the left  inguinal node.  The plan was systemic chemotherapy and this was to be  arranged after the patient was discharged to home.  On postoperative day  #2, a Port-A-Cath was placed by Interventional Radiology.  She was  discharged to home on postoperative day #3 tolerating a regular diet.   DISCHARGE DIAGNOSIS:  Undifferentiated cancer in the left inguinal node.   PROCEDURES:  Total abdominal hysterectomy bilateral salpingo-  oophorectomy, left inguinal lymph node biopsy.   CONDITION:  Stable.   DISCHARGE INSTRUCTIONS:  1. Diet regular.  2. Activity:  Pelvic rest progressive activity.   MEDICATIONS:  Percocet 1-2 tabs every 6 hours as needed.   DISPOSITION:  She was instructed to keep the Port-A-Cath site clean and  dry.  She is to follow up in the GYN Oncology office on January 18 at  10:00  a.m. for staple removal, and on February 24 at 11:00 a.m. for  postoperative follow-up.  She was also to follow up with Dr. Marin Olp on  January 19.      Agnes Lawrence, M.D.  Electronically Signed     LAJ/MEDQ  D:  07/05/2008  T:  07/05/2008  Job:  [id number]   cc:   Caswell Corwin, R.N.  [address], [place] [address]   [place]. Marin Olp, M.D.  Fax: [phone]   Imogene Burn. Georgette Dover, M.D.  [place]. Mezer, M.D.  Fax: Eugenio Saenz. Regis Bill, MD  [address] [place], [place] [address]

## 2010-09-25 NOTE — Op Note (Signed)
[name], [name]            ACCOUNT NO.:  [id number]   MEDICAL RECORD NO.:  [id number]          PATIENT TYPE:  AMB   LOCATION:  DAY                          FACILITY:  [facility]   PHYSICIAN:  Imogene Burn. Georgette Dover, M.D. DATE OF BIRTH:  01-28-1961   DATE OF PROCEDURE:  DATE OF DISCHARGE:  04/27/2008                               OPERATIVE REPORT   PREOPERATIVE DIAGNOSIS:  Right groin/suprapubic mass.   POSTOPERATIVE DIAGNOSIS:  Right groin/suprapubic mass.   PROCEDURE PERFORMED:  Excision of a 2 x 2 x 4 cm right suprapubic  subcutaneous mass.   SURGEON:  Imogene Burn. Tsuei, M.D.   ANESTHESIA:  Local MAC.   INDICATIONS:  The patient is a 50 year old female who presented with a  palpable mass in her right groin.  This has been present for a couple of  months.  Is not inflamed and it does not reduce on palpation.  A CT scan  of this area showed no sign of hernia but there was a very large  superficial inguinal lymph node.  We recommend excision of this area for  biopsy.   DESCRIPTION OF PROCEDURE:  The patient is brought to the operating room,  placed in a supine position on the operating room table.  After an  adequate level of intravenous sedation was administered her right groin  was shaved, prepped with Betadine and draped in sterile fashion.  A time-  out was taken to assure the proper patient and proper procedure.  We  made a transverse incision directly over the palpable mass after  infiltrating with 0.25% Marcaine with epinephrine.  Dissection was  carried down in the subcutaneous tissues with cautery.  We placed a  Weitlaner retractor for exposure.  We bluntly dissected around the  entire mass.  Cautery was used to detach the mass from its surrounding  tissue.  No enlarged blood vessels were noted in this area.  The mass  had been oriented in a transverse fashion in greatest dimension.  The  mass was sent for pathologic examination.  The wound was thoroughly  irrigated and  no bleeding was noted.  The wound was then closed with a  couple of layers in the subcutaneous tissues with 3-0 Vicryl.  Monocryl  4-0 was used to close the skin incision.  Dermabond was used to seal the  skin.  The patient was then extubated and brought to the recovery room  in stable condition.  All sponge, instrument and needle counts were  correct.      Imogene Burn. Tsuei, M.D.  Electronically Signed     MKT/MEDQ  D:  04/27/2008  T:  04/28/2008  Job:  [id number]

## 2010-09-25 NOTE — Consult Note (Signed)
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   HISTORY OF PRESENT ILLNESS:  A 50 year old African American female  admitted to undergo further staging of adenocarcinoma most likely  arising from gynecologic origin.   The patient initially presented with a right inguinal mass that the  patient noted at the time of shaving.  The mass has been excised and is  an adenocarcinoma.  Special stains are consistent with an ovarian or an  adnexal primary malignancy.  In addition, molecular cancer profiling has  been performed, which begins to suggest an ovarian serous tumor with 85%  probability.  Because of this, the patient underwent a diagnostic  laparoscopy with multiple peritoneal biopsies, washings, and fractional  D and C by Dr. Ned Clines on May 17, 2008.  All biopsies have  returned showing no evidence of malignancy, although peritoneal washings  showed rare atypical cells.  The patient has known uterine fibroids,  which may be obscuring a lesion.  She also has a left palpable inguinal  lymph node.  She is admitted to undergo exploratory laparotomy, total  abdominal hysterectomy, bilateral salpingo-oophorectomy, and excision of  the left inguinal lymph node for further debulking as well as continuing  to search to identify the primary site of her malignancy on January  12,2010.   PAST MEDICAL HISTORY/MEDICAL ILLNESSES:  Obesity, hyperlipidemia, and  hypertension.   PAST SURGICAL HISTORY:  D and C for menorrhagia.  Uterine artery  embolization in 2006.  Breast biopsy in May of 2008.  Gastric bypass  procedure  approximately 30 years ago.  That surgery was complicated by  postoperative pulmonary embolus.  The patient has undergone a workup for  thrombophilia, which apparently is negative.   The patient also had a skin cancer excised from her forehead in the  past.   CURRENT MEDICATIONS:  1. Vitamin D.  2. Simvastatin.  3. Lexapro.  4. Maxzide.  5. Ativan p.r.n.   DRUG ALLERGIES:  None.   FAMILY HISTORY:  Maternal grandmother with breast cancer.   SOCIAL HISTORY:  The patient is married.  She does not smoke.   REVIEW OF SYSTEMS:  A 10-point comprehensive review of systems is  negative, except as noted above.   PHYSICAL EXAMINATION:  VITAL SIGNS:  Height 5 feet 10 inches, weight 228  pounds, blood pressure 130/70, pulse 80, respiratory rate 20.  GENERAL:  The patient is a healthy African American female in no acute  distress.  HEENT:  Negative.  NECK:  Supple without thyromegaly.  LYMPHATICS:  There is no supraclavicular or inguinal adenopathy.  The  right inguinal lymph node incision is healing well.  She does have  palpable lymph node approximately 3 cm in her left inguinal region.  ABDOMEN:  The abdomen is otherwise soft, nontender.  No mass,  organomegaly, ascites, or hernias noted.  PELVIC:  EG, BUS, vagina, and urethra normal.  Cervix is normal.  Uterus  is slightly irregular.  It is difficult to outline because of the  patient's obesity.  No adnexal masses are noted.  Rectovaginal exam  confirms.   The patient's recent laboratory work, as well as pathology reports, are  reviewed, as noted above.  A PET scan has also been performed, which  shows a left inguinal node with a SUV of 6.8 no other evidence of  metastatic disease, nor does it suggest a primary site of her  malignancy.  CT scan likewise shows no evidence of a primary site.   PLAN:  The patient is admitted to undergo total abdominal hysterectomy  and bilateral salpingo-oophorectomy, left inguinal lymphadenectomy,  and  possible pelvic lymphadenectomy.  The risks of surgery include  hemorrhage, infection, injury to adjacent viscera, thromboembolic  complications, were all outlined to the patient in detail over the  telephone.  She understands that she is also at increased risk for  thromboembolic complications given her past history.  All questions are  answered and we will proceed with surgery as outlined above.      Marti Sleigh, M.D.  Electronically Signed     DC/MEDQ  D:  05/23/2008  T:  05/23/2008  Job:  [id number]   cc:   Caswell Corwin, R.N.  [address], [place] [address]   Peter R. Marin Olp, M.D.  Fax: [phone]   Imogene Burn. Georgette Dover, M.D.  [place]. Mezer, M.D.  Fax: [phone]   Standley Brooking. Regis Bill, MD  [address] [place], [place] [address]

## 2010-10-16 ENCOUNTER — Ambulatory Visit (INDEPENDENT_AMBULATORY_CARE_PROVIDER_SITE_OTHER): Payer: Managed Care, Other (non HMO) | Admitting: Internal Medicine

## 2010-10-16 ENCOUNTER — Other Ambulatory Visit: Payer: Self-pay | Admitting: Hematology & Oncology

## 2010-10-16 ENCOUNTER — Encounter: Payer: Managed Care, Other (non HMO) | Admitting: Hematology & Oncology

## 2010-10-16 ENCOUNTER — Encounter: Payer: Self-pay | Admitting: Internal Medicine

## 2010-10-16 VITALS — BP 100/80 | HR 80 | Ht 68.0 in | Wt 243.0 lb

## 2010-10-16 DIAGNOSIS — C801 Malignant (primary) neoplasm, unspecified: Secondary | ICD-10-CM

## 2010-10-16 DIAGNOSIS — Z1211 Encounter for screening for malignant neoplasm of colon: Secondary | ICD-10-CM

## 2010-10-16 DIAGNOSIS — I10 Essential (primary) hypertension: Secondary | ICD-10-CM

## 2010-10-16 DIAGNOSIS — Z0000 Encounter for general adult medical examination without abnormal findings: Secondary | ICD-10-CM

## 2010-10-16 DIAGNOSIS — R82998 Other abnormal findings in urine: Secondary | ICD-10-CM

## 2010-10-16 DIAGNOSIS — E785 Hyperlipidemia, unspecified: Secondary | ICD-10-CM

## 2010-10-16 DIAGNOSIS — Z86718 Personal history of other venous thrombosis and embolism: Secondary | ICD-10-CM

## 2010-10-16 DIAGNOSIS — H269 Unspecified cataract: Secondary | ICD-10-CM

## 2010-10-16 DIAGNOSIS — G622 Polyneuropathy due to other toxic agents: Secondary | ICD-10-CM

## 2010-10-16 DIAGNOSIS — F17200 Nicotine dependence, unspecified, uncomplicated: Secondary | ICD-10-CM

## 2010-10-16 DIAGNOSIS — E349 Endocrine disorder, unspecified: Secondary | ICD-10-CM

## 2010-10-16 DIAGNOSIS — E876 Hypokalemia: Secondary | ICD-10-CM

## 2010-10-16 DIAGNOSIS — R8290 Unspecified abnormal findings in urine: Secondary | ICD-10-CM

## 2010-10-16 DIAGNOSIS — R109 Unspecified abdominal pain: Secondary | ICD-10-CM

## 2010-10-16 DIAGNOSIS — Z9884 Bariatric surgery status: Secondary | ICD-10-CM

## 2010-10-16 LAB — PROTIME-INR (CHCC SATELLITE)
INR: 1.5 — ABNORMAL LOW (ref 2.0–3.5)
Protime: 18 Seconds — ABNORMAL HIGH (ref 10.6–13.4)

## 2010-10-16 LAB — URINALYSIS, MICROSCOPIC (CHCC SATELLITE)
Leukocyte Esterase: NEGATIVE
Nitrite: NEGATIVE
Protein: NEGATIVE mg/dL
pH: 6 (ref 4.60–8.00)

## 2010-10-16 MED ORDER — VARENICLINE TARTRATE 0.5 MG X 11 & 1 MG X 42 PO MISC: ORAL | Status: DC

## 2010-10-16 MED ORDER — VARENICLINE TARTRATE 1 MG PO TABS: 1.0000 mg | ORAL_TABLET | Freq: Two times a day (BID) | ORAL | Status: AC

## 2010-10-16 MED ORDER — POTASSIUM CHLORIDE 10 MEQ PO TBCR
10.0000 meq | EXTENDED_RELEASE_TABLET | Freq: Every day | ORAL | Status: DC
Start: 2010-10-16 — End: 2011-04-25

## 2010-10-16 NOTE — Patient Instructions (Addendum)
Add potassium to help each day. Check lab in 3-4 weeks   will do a referral for colonoscopy  And urology. Weight loss and  Tobacco cessation  Would help your health. Disc abd  Discomfort.   pain if persists with    Dr Marin Olp if continues

## 2010-10-16 NOTE — Progress Notes (Signed)
Subjective:    Patient ID: [name], female    DOB: 12/18/1960, 50 y.o.   MRN: [id number]  HPI Patient comes in today for [place]  but also for MM problems   Hx of anemia:  Doing ok  BP: good  Nose of meds  Cancer :  ? Primary    Iron infusion done.  GYNE: still sees  Gyne  .    Vaginal infection  Rx with suppositories.   Blood in urine x 2  Noted by others alos without sx.  LIPIDS:  No se of meds noted  Clotting  Hx of Pulm embolism  Hx of low Protein S  Also now dx with cataracts. Sp bariatric surgery   Tobacco   Did better on chantix but worried about se .   Would like to re try anyway.  Ran out of lexapro and went off. And then began  More crying spells.  Review of Systems  arthritis  Joint aches   ? oa  Neuropathy not lightened up.   Still a balance problem.  Bad rotator cuff .  Now has cataracts.   No cp sob.   Past history family history social history reviewed in the electronic medical record.    Objective:   Physical Exam Wt Readings from Last 3 Encounters:  10/16/10 243 lb (110.224 kg)  02/14/10 240 lb (108.863 kg)  02/12/10 240 lb (108.863 kg)   Physical Exam: Vital signs reviewed VOH:YWVP is a well-developed well-nourished alert cooperative  AA female who appears her stated age in no acute distress.  HEENT: normocephalic  traumatic , Eyes: PERRL EOM's full, conjunctiva clear, Nares: paten,t no deformity discharge or tenderness., Ears: no deformity EAC's clear TMs with normal landmarks. Mouth: clear OP, no lesions, edema.  Moist mucous membranes. Dentition in adequate repair. NECK: supple without masses, thyromegaly or bruits. CHEST/PULM:  Clear to auscultation and percussion breath sounds equal no wheeze , rales or rhonchi. No chest wall deformities or tenderness. Breast: normal by inspection . No dimpling, discharge, masses, tenderness or discharge . CV: PMI is nondisplaced, S1 S2 no gallops, murmurs, rubs. Peripheral pulses are full without  delay.No JVD .  ABDOMEN: Bowel sounds normal nontender  No guard or rebound, no hepato splenomegal no CVA tenderness.  No hernia. Points to low abd as area of discomfort  No g or r  Extremtities:  No clubbing cyanosis or edema, no acute joint swelling or redness no focal atrophy no ulcers  NEURO:  Oriented x3, cranial nerves 3-12 appear to be intact, no obvious focal weakness,gait within normal limits no abnormal reflexes or asymmetrical. SKIN: No acute rashes normal turgor, color, no bruising or petechiae. PSYCH: Oriented, good eye contact, no obvious depression anxiety, cognition and judgment appear normal.  See labs  [facility] but   K 3.2     Had ekg in february  Assessment & Plan:  Preventive Health Care Counseled regarding healthy nutrition, exercise, sleep, injury prevention, calcium vit d and healthy weight . utd immuniz  Hx of pe and underlying hyper coagulability   Obesity  Abd pain.  Seem s minor but has hx of cancer and  unknown primary poss  ovarian HT  On meds nose  LIPIDS:   On meds  Tobacco    Counseled.   Potassium  A bit low  From diuretic    add     And recheck bmp and mg  In 3-4 weeks. Blood in urine asymptomatic and apparently noted elsewhere.  Disc options     Urology referral; Hx of cancer unknown  primary   secondary neuropathy from chemo.   Colonoscopy referral for routine screening never done  ( neg pet scan when doing cancer staging)

## 2010-10-19 DIAGNOSIS — H269 Unspecified cataract: Secondary | ICD-10-CM | POA: Insufficient documentation

## 2010-10-19 DIAGNOSIS — R8290 Unspecified abnormal findings in urine: Secondary | ICD-10-CM | POA: Insufficient documentation

## 2010-10-26 ENCOUNTER — Ambulatory Visit: Payer: Managed Care, Other (non HMO) | Admitting: Internal Medicine

## 2010-11-06 ENCOUNTER — Other Ambulatory Visit: Payer: Managed Care, Other (non HMO)

## 2010-11-09 ENCOUNTER — Other Ambulatory Visit: Payer: Self-pay | Admitting: Internal Medicine

## 2010-11-09 NOTE — Telephone Encounter (Signed)
Rx sent to pharmacy   

## 2010-11-28 ENCOUNTER — Encounter (HOSPITAL_BASED_OUTPATIENT_CLINIC_OR_DEPARTMENT_OTHER): Payer: Medicare Other | Admitting: Hematology & Oncology

## 2010-11-28 ENCOUNTER — Other Ambulatory Visit: Payer: Self-pay | Admitting: Hematology & Oncology

## 2010-11-28 DIAGNOSIS — C569 Malignant neoplasm of unspecified ovary: Secondary | ICD-10-CM

## 2010-11-28 DIAGNOSIS — Z86718 Personal history of other venous thrombosis and embolism: Secondary | ICD-10-CM

## 2010-11-28 DIAGNOSIS — C801 Malignant (primary) neoplasm, unspecified: Secondary | ICD-10-CM

## 2010-11-28 DIAGNOSIS — Z7901 Long term (current) use of anticoagulants: Secondary | ICD-10-CM

## 2010-11-28 DIAGNOSIS — D485 Neoplasm of uncertain behavior of skin: Secondary | ICD-10-CM

## 2010-11-28 LAB — CBC WITH DIFFERENTIAL (CANCER CENTER ONLY)
BASO#: 0 10*3/uL (ref 0.0–0.2)
BASO%: 0.2 % (ref 0.0–2.0)
HCT: 40.5 % (ref 34.8–46.6)
HGB: 14.2 g/dL (ref 11.6–15.9)
LYMPH#: 1.4 10*3/uL (ref 0.9–3.3)
MONO#: 0.5 10*3/uL (ref 0.1–0.9)
NEUT%: 65.8 % (ref 39.6–80.0)
RBC: 4.6 10*6/uL (ref 3.70–5.32)
RDW: 15.5 % (ref 11.1–15.7)
WBC: 5.7 10*3/uL (ref 3.9–10.0)

## 2010-11-28 LAB — COMPREHENSIVE METABOLIC PANEL
ALT: 34 U/L (ref 0–35)
CO2: 24 mEq/L (ref 19–32)
Calcium: 9 mg/dL (ref 8.4–10.5)
Chloride: 105 mEq/L (ref 96–112)
Creatinine, Ser: 0.89 mg/dL (ref 0.50–1.10)
Total Protein: 6.4 g/dL (ref 6.0–8.3)

## 2010-11-28 LAB — CA 125: CA 125: 3.6 U/mL (ref 0.0–30.2)

## 2010-11-28 LAB — PROTIME-INR (CHCC SATELLITE)

## 2010-12-04 ENCOUNTER — Ambulatory Visit (INDEPENDENT_AMBULATORY_CARE_PROVIDER_SITE_OTHER): Payer: Managed Care, Other (non HMO) | Admitting: Internal Medicine

## 2010-12-04 ENCOUNTER — Encounter: Payer: Self-pay | Admitting: Internal Medicine

## 2010-12-04 VITALS — BP 132/78 | HR 88 | Ht 70.0 in | Wt 243.0 lb

## 2010-12-04 DIAGNOSIS — K589 Irritable bowel syndrome without diarrhea: Secondary | ICD-10-CM

## 2010-12-04 DIAGNOSIS — C801 Malignant (primary) neoplasm, unspecified: Secondary | ICD-10-CM

## 2010-12-04 DIAGNOSIS — Z1211 Encounter for screening for malignant neoplasm of colon: Secondary | ICD-10-CM

## 2010-12-04 MED ORDER — PEG-KCL-NACL-NASULF-NA ASC-C 100 G PO SOLR: 1.0000 | Freq: Once | ORAL | Status: DC

## 2010-12-04 NOTE — Patient Instructions (Signed)
You have been scheduled for a Colonoscopy with separate instructions given. Your prep kit has been sent to your pharmacy for you to pick up.

## 2010-12-04 NOTE — Progress Notes (Signed)
  Subjective:    Patient ID: [name], female    DOB: 1960/09/12, 50 y.o.   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 Some days she needs a suppository to defecate for many years.    Review of Systems Face lift 8/8 GU eval for hematuria pending.    Objective:   Physical Exam Obese, NAD        Assessment & Plan:  Appropriate for screening colonoscopy. Hx of ovarian cancer, in remission.  Colonoscopy 12/06/10

## 2010-12-06 ENCOUNTER — Ambulatory Visit (AMBULATORY_SURGERY_CENTER): Payer: Managed Care, Other (non HMO) | Admitting: Internal Medicine

## 2010-12-06 ENCOUNTER — Encounter: Payer: Self-pay | Admitting: Internal Medicine

## 2010-12-06 VITALS — BP 121/67 | HR 81 | Temp 98.1°F | Resp 18 | Ht 70.0 in | Wt 245.0 lb

## 2010-12-06 DIAGNOSIS — Z1211 Encounter for screening for malignant neoplasm of colon: Secondary | ICD-10-CM

## 2010-12-06 DIAGNOSIS — K648 Other hemorrhoids: Secondary | ICD-10-CM

## 2010-12-06 MED ORDER — SODIUM CHLORIDE 0.9 % IV SOLN: 500.0000 mL | INTRAVENOUS | Status: DC

## 2010-12-06 NOTE — Patient Instructions (Signed)
RESUME ALL MEDICATIONS.INFOMATION GIVEN ON HEMORRHOIDS AND HIGH FIBER DIET.

## 2010-12-07 ENCOUNTER — Telehealth: Payer: Self-pay

## 2010-12-07 ENCOUNTER — Encounter (HOSPITAL_COMMUNITY): Payer: Self-pay

## 2010-12-07 ENCOUNTER — Encounter (HOSPITAL_COMMUNITY)
Admission: RE | Admit: 2010-12-07 | Discharge: 2010-12-07 | Disposition: A | Discharge status: Home / Self Care | Payer: Medicare Other | Source: Ambulatory Visit | Attending: Hematology & Oncology | Admitting: Hematology & Oncology

## 2010-12-07 DIAGNOSIS — Z9079 Acquired absence of other genital organ(s): Secondary | ICD-10-CM | POA: Insufficient documentation

## 2010-12-07 DIAGNOSIS — Z90710 Acquired absence of both cervix and uterus: Secondary | ICD-10-CM | POA: Insufficient documentation

## 2010-12-07 DIAGNOSIS — C569 Malignant neoplasm of unspecified ovary: Secondary | ICD-10-CM | POA: Insufficient documentation

## 2010-12-07 DIAGNOSIS — D485 Neoplasm of uncertain behavior of skin: Secondary | ICD-10-CM

## 2010-12-07 DIAGNOSIS — K449 Diaphragmatic hernia without obstruction or gangrene: Secondary | ICD-10-CM | POA: Insufficient documentation

## 2010-12-07 MED ORDER — FLUDEOXYGLUCOSE F - 18 (FDG) INJECTION
18.6000 | Freq: Once | INTRAVENOUS | Status: AC | PRN
  Administered 2010-12-07: 18.6 via INTRAVENOUS

## 2010-12-07 NOTE — Telephone Encounter (Signed)
Follow up Call- Patient questions:  Do you have a fever, pain , or abdominal swelling? no Pain Score  0 *  Have you tolerated food without any problems? yes  Have you been able to return to your normal activities? yes  Do you have any questions about your discharge instructions: Diet   no Medications  no Follow up visit  no  Do you have questions or concerns about your Care? no  Actions: * If pain score is 4 or above: No action needed, pain <4.  I woke the pt up.  I asked if she felt okay.  Yes per the pt.  MAW

## 2010-12-10 LAB — GLUCOSE, CAPILLARY: Glucose-Capillary: 94 mg/dL (ref 70–99)

## 2011-01-09 ENCOUNTER — Other Ambulatory Visit: Payer: Self-pay | Admitting: Internal Medicine

## 2011-01-31 ENCOUNTER — Other Ambulatory Visit: Payer: Self-pay | Admitting: Hematology & Oncology

## 2011-01-31 ENCOUNTER — Encounter (HOSPITAL_BASED_OUTPATIENT_CLINIC_OR_DEPARTMENT_OTHER): Payer: Managed Care, Other (non HMO) | Admitting: Hematology & Oncology

## 2011-01-31 DIAGNOSIS — E559 Vitamin D deficiency, unspecified: Secondary | ICD-10-CM

## 2011-01-31 DIAGNOSIS — I2699 Other pulmonary embolism without acute cor pulmonale: Secondary | ICD-10-CM

## 2011-01-31 DIAGNOSIS — C801 Malignant (primary) neoplasm, unspecified: Secondary | ICD-10-CM

## 2011-01-31 DIAGNOSIS — Z7901 Long term (current) use of anticoagulants: Secondary | ICD-10-CM

## 2011-01-31 DIAGNOSIS — G620 Drug-induced polyneuropathy: Secondary | ICD-10-CM

## 2011-01-31 DIAGNOSIS — R319 Hematuria, unspecified: Secondary | ICD-10-CM

## 2011-01-31 DIAGNOSIS — Z86718 Personal history of other venous thrombosis and embolism: Secondary | ICD-10-CM

## 2011-01-31 LAB — CBC WITH DIFFERENTIAL (CANCER CENTER ONLY)
BASO#: 0 10*3/uL (ref 0.0–0.2)
EOS%: 1.4 % (ref 0.0–7.0)
HGB: 13 g/dL (ref 11.6–15.9)
LYMPH%: 28.1 % (ref 14.0–48.0)
MCH: 29.9 pg (ref 26.0–34.0)
MCHC: 34.8 g/dL (ref 32.0–36.0)
MCV: 86 fL (ref 81–101)
MONO%: 9.3 % (ref 0.0–13.0)
NEUT#: 3 10*3/uL (ref 1.5–6.5)
Platelets: 207 10*3/uL (ref 145–400)

## 2011-02-01 ENCOUNTER — Other Ambulatory Visit: Payer: Self-pay | Admitting: Urology

## 2011-02-01 ENCOUNTER — Ambulatory Visit (HOSPITAL_BASED_OUTPATIENT_CLINIC_OR_DEPARTMENT_OTHER)
Admission: RE | Admit: 2011-02-01 | Discharge: 2011-02-01 | Disposition: A | Discharge status: Home / Self Care | Payer: Medicare Other | Source: Ambulatory Visit | Attending: Urology | Admitting: Urology

## 2011-02-01 DIAGNOSIS — R3129 Other microscopic hematuria: Secondary | ICD-10-CM | POA: Insufficient documentation

## 2011-02-01 DIAGNOSIS — Z9079 Acquired absence of other genital organ(s): Secondary | ICD-10-CM | POA: Insufficient documentation

## 2011-02-01 DIAGNOSIS — F329 Major depressive disorder, single episode, unspecified: Secondary | ICD-10-CM | POA: Insufficient documentation

## 2011-02-01 DIAGNOSIS — D649 Anemia, unspecified: Secondary | ICD-10-CM | POA: Insufficient documentation

## 2011-02-01 DIAGNOSIS — F3289 Other specified depressive episodes: Secondary | ICD-10-CM | POA: Insufficient documentation

## 2011-02-01 DIAGNOSIS — Z8543 Personal history of malignant neoplasm of ovary: Secondary | ICD-10-CM | POA: Insufficient documentation

## 2011-02-01 DIAGNOSIS — Z86711 Personal history of pulmonary embolism: Secondary | ICD-10-CM | POA: Insufficient documentation

## 2011-02-01 DIAGNOSIS — Z90710 Acquired absence of both cervix and uterus: Secondary | ICD-10-CM | POA: Insufficient documentation

## 2011-02-01 DIAGNOSIS — F17200 Nicotine dependence, unspecified, uncomplicated: Secondary | ICD-10-CM | POA: Insufficient documentation

## 2011-02-01 DIAGNOSIS — Z01812 Encounter for preprocedural laboratory examination: Secondary | ICD-10-CM | POA: Insufficient documentation

## 2011-02-01 DIAGNOSIS — I10 Essential (primary) hypertension: Secondary | ICD-10-CM | POA: Insufficient documentation

## 2011-02-01 DIAGNOSIS — Z9884 Bariatric surgery status: Secondary | ICD-10-CM | POA: Insufficient documentation

## 2011-02-01 LAB — COMPREHENSIVE METABOLIC PANEL
Albumin: 4.1 g/dL (ref 3.5–5.2)
CO2: 27 mEq/L (ref 19–32)
Calcium: 9.4 mg/dL (ref 8.4–10.5)
Chloride: 98 mEq/L (ref 96–112)
Glucose, Bld: 102 mg/dL — ABNORMAL HIGH (ref 70–99)
Sodium: 138 mEq/L (ref 135–145)
Total Bilirubin: 0.4 mg/dL (ref 0.3–1.2)
Total Protein: 7.1 g/dL (ref 6.0–8.3)

## 2011-02-01 LAB — D-DIMER, QUANTITATIVE: D-Dimer, Quant: 0.4 ug/mL-FEU (ref 0.00–0.48)

## 2011-02-01 LAB — IRON AND TIBC
%SAT: 13 % — ABNORMAL LOW (ref 20–55)
Iron: 51 ug/dL (ref 42–145)
UIBC: 327 ug/dL (ref 125–400)

## 2011-02-01 LAB — VITAMIN D 25 HYDROXY (VIT D DEFICIENCY, FRACTURES): Vit D, 25-Hydroxy: 47 ng/mL (ref 30–89)

## 2011-02-01 LAB — TSH: TSH: 2.227 u[IU]/mL (ref 0.350–4.500)

## 2011-02-04 ENCOUNTER — Other Ambulatory Visit: Payer: Self-pay | Admitting: Internal Medicine

## 2011-02-04 DIAGNOSIS — E559 Vitamin D deficiency, unspecified: Secondary | ICD-10-CM

## 2011-02-04 LAB — POCT I-STAT 4, (NA,K, GLUC, HGB,HCT)
Glucose, Bld: 98 mg/dL (ref 70–99)
HCT: 39 % (ref 36.0–46.0)
Sodium: 138 mEq/L (ref 135–145)

## 2011-02-06 NOTE — Telephone Encounter (Signed)
Try off vit d 50,000 and do vit 1,000 iu. Needs Vit d, bmp and mg in 2-3 months with rov after. Per md.

## 2011-02-07 NOTE — Telephone Encounter (Signed)
Pt aware and will call back to schedule an appt

## 2011-02-08 NOTE — Op Note (Signed)
  NAMETAKIESHA, [name] NO.:  [id number]  MEDICAL RECORD NO.:  [id number]  LOCATION:                               FACILITY:  [facility]  PHYSICIAN:  Arvil Persons, M.D.  DATE OF BIRTH:  Jun 08, 1960  DATE OF PROCEDURE:  02/01/2011 DATE OF DISCHARGE:                              OPERATIVE REPORT   PREOPERATIVE DIAGNOSIS:  Microhematuria.  POSTOPERATIVE DIAGNOSIS:  Microhematuria.  PROCEDURES DONE:  Cystoscopy and bladder washings for cytology.  SURGEON:  Arvil Persons, M.D.  ANESTHESIA:  General.  INDICATIONS:  The patient is a 51 year old female who was found on urinalysis to have a persistent microhematuria.  She is status post hysterectomy and oophorectomy for ovarian cancer.  PET scan as per the patient was unremarkable.  NMP-22 is positive.  She needs cystoscopy. She prefers to have it under anesthesia.  She is scheduled today for the procedure.  The patient was identified by her wristband and proper time-out was taken.  Under general anesthesia, she was prepped and draped and placed in the dorsal lithotomy position.  A panendoscope was inserted in the bladder. The bladder mucosa is normal.  There is no stone or tumor in the bladder.  The ureteral orifices are in normal position and shape with clear efflux.  Then, the bladder was irrigated with normal saline and bladder washings were sent for cytology.  The patient tolerated the procedure well and left the OR in satisfactory condition to post anesthesia care unit.     Arvil Persons, M.D.     MN/MEDQ  D:  02/01/2011  T:  02/01/2011  Job:  [id number]  cc:   Standley Brooking. Regis Bill, MD [place], [place] [address]  Electronically Signed by Hanley Ben M.D. on 02/08/2011 10:17:37 AM

## 2011-02-15 LAB — DIFFERENTIAL
Basophils Absolute: 0 10*3/uL (ref 0.0–0.1)
Basophils Absolute: 0 10*3/uL (ref 0.0–0.1)
Basophils Relative: 0 % (ref 0–1)
Basophils Relative: 1 % (ref 0–1)
Eosinophils Absolute: 0.1 10*3/uL (ref 0.0–0.7)
Monocytes Absolute: 0.4 10*3/uL (ref 0.1–1.0)
Neutro Abs: 7 10*3/uL (ref 1.7–7.7)
Neutrophils Relative %: 75 % (ref 43–77)
Neutrophils Relative %: 79 % — ABNORMAL HIGH (ref 43–77)

## 2011-02-15 LAB — CBC
Hemoglobin: 14.4 g/dL (ref 12.0–15.0)
MCHC: 32.7 g/dL (ref 30.0–36.0)
MCHC: 33.3 g/dL (ref 30.0–36.0)
MCV: 88.6 fL (ref 78.0–100.0)
Platelets: 241 10*3/uL (ref 150–400)
RDW: 14.7 % (ref 11.5–15.5)
RDW: 14.8 % (ref 11.5–15.5)
WBC: 6.8 10*3/uL (ref 4.0–10.5)

## 2011-02-15 LAB — BASIC METABOLIC PANEL
BUN: 12 mg/dL (ref 6–23)
BUN: 14 mg/dL (ref 6–23)
CO2: 27 mEq/L (ref 19–32)
CO2: 28 mEq/L (ref 19–32)
Calcium: 9.6 mg/dL (ref 8.4–10.5)
Chloride: 99 mEq/L (ref 96–112)
Creatinine, Ser: 0.85 mg/dL (ref 0.4–1.2)
Creatinine, Ser: 0.98 mg/dL (ref 0.4–1.2)
Glucose, Bld: 106 mg/dL — ABNORMAL HIGH (ref 70–99)

## 2011-02-15 LAB — PREGNANCY, URINE: Preg Test, Ur: NEGATIVE

## 2011-02-21 ENCOUNTER — Ambulatory Visit (HOSPITAL_BASED_OUTPATIENT_CLINIC_OR_DEPARTMENT_OTHER): Payer: Managed Care, Other (non HMO) | Admitting: Hematology & Oncology

## 2011-02-21 DIAGNOSIS — Z7901 Long term (current) use of anticoagulants: Secondary | ICD-10-CM

## 2011-02-21 DIAGNOSIS — C801 Malignant (primary) neoplasm, unspecified: Secondary | ICD-10-CM

## 2011-02-21 DIAGNOSIS — I2699 Other pulmonary embolism without acute cor pulmonale: Secondary | ICD-10-CM

## 2011-02-21 DIAGNOSIS — G589 Mononeuropathy, unspecified: Secondary | ICD-10-CM

## 2011-03-06 ENCOUNTER — Encounter: Payer: Self-pay | Admitting: *Deleted

## 2011-03-12 ENCOUNTER — Other Ambulatory Visit: Payer: Self-pay | Admitting: Family

## 2011-03-18 ENCOUNTER — Other Ambulatory Visit: Payer: Self-pay | Admitting: Internal Medicine

## 2011-03-25 ENCOUNTER — Other Ambulatory Visit: Payer: Self-pay | Admitting: Hematology & Oncology

## 2011-03-25 ENCOUNTER — Ambulatory Visit: Payer: Managed Care, Other (non HMO)

## 2011-03-25 ENCOUNTER — Ambulatory Visit (HOSPITAL_BASED_OUTPATIENT_CLINIC_OR_DEPARTMENT_OTHER): Payer: Managed Care, Other (non HMO) | Admitting: Hematology & Oncology

## 2011-03-25 ENCOUNTER — Other Ambulatory Visit (HOSPITAL_BASED_OUTPATIENT_CLINIC_OR_DEPARTMENT_OTHER): Payer: Managed Care, Other (non HMO) | Admitting: Lab

## 2011-03-25 VITALS — BP 122/93 | HR 98 | Temp 97.5°F | Ht 70.0 in | Wt 241.0 lb

## 2011-03-25 DIAGNOSIS — C801 Malignant (primary) neoplasm, unspecified: Secondary | ICD-10-CM

## 2011-03-25 DIAGNOSIS — Z86718 Personal history of other venous thrombosis and embolism: Secondary | ICD-10-CM

## 2011-03-25 DIAGNOSIS — R109 Unspecified abdominal pain: Secondary | ICD-10-CM

## 2011-03-25 DIAGNOSIS — Z7901 Long term (current) use of anticoagulants: Secondary | ICD-10-CM

## 2011-03-25 DIAGNOSIS — G589 Mononeuropathy, unspecified: Secondary | ICD-10-CM

## 2011-03-25 DIAGNOSIS — I2699 Other pulmonary embolism without acute cor pulmonale: Secondary | ICD-10-CM

## 2011-03-25 LAB — CBC WITH DIFFERENTIAL (CANCER CENTER ONLY)
BASO%: 0.2 % (ref 0.0–2.0)
Eosinophils Absolute: 0.1 10*3/uL (ref 0.0–0.5)
MONO#: 0.3 10*3/uL (ref 0.1–0.9)
NEUT#: 2.7 10*3/uL (ref 1.5–6.5)
Platelets: 195 10*3/uL (ref 145–400)
RBC: 4.26 10*6/uL (ref 3.70–5.32)
WBC: 4.7 10*3/uL (ref 3.9–10.0)

## 2011-03-25 LAB — COMPREHENSIVE METABOLIC PANEL
ALT: 18 U/L (ref 0–35)
ALT: 18 U/L (ref 0–35)
Albumin: 4.1 g/dL (ref 3.5–5.2)
Alkaline Phosphatase: 90 U/L (ref 39–117)
CO2: 26 mEq/L (ref 19–32)
CO2: 26 mEq/L (ref 19–32)
Calcium: 9.7 mg/dL (ref 8.4–10.5)
Chloride: 102 mEq/L (ref 96–112)
Glucose, Bld: 78 mg/dL (ref 70–99)
Glucose, Bld: 78 mg/dL (ref 70–99)
Potassium: 4.1 mEq/L (ref 3.5–5.3)
Sodium: 139 mEq/L (ref 135–145)
Sodium: 139 mEq/L (ref 135–145)
Total Bilirubin: 0.3 mg/dL (ref 0.3–1.2)
Total Protein: 6.7 g/dL (ref 6.0–8.3)
Total Protein: 6.7 g/dL (ref 6.0–8.3)

## 2011-03-25 LAB — FERRITIN: Ferritin: 158 ng/mL (ref 10–291)

## 2011-03-25 LAB — LACTATE DEHYDROGENASE
LDH: 161 U/L (ref 94–250)
LDH: 161 U/L (ref 94–250)

## 2011-03-25 LAB — IRON AND TIBC
Iron: 101 ug/dL (ref 42–145)
TIBC: 305 ug/dL (ref 250–470)

## 2011-03-25 MED ORDER — HEPARIN SOD (PORK) LOCK FLUSH 100 UNIT/ML IV SOLN
500.0000 [IU] | Freq: Once | INTRAVENOUS | Status: AC
  Administered 2011-03-25: 500 [IU] via INTRAVENOUS
  Filled 2011-03-25: qty 5

## 2011-03-25 MED ORDER — SODIUM CHLORIDE 0.9 % IJ SOLN
10.0000 mL | INTRAMUSCULAR | Status: DC | PRN
  Administered 2011-03-25: 10 mL via INTRAVENOUS
  Filled 2011-03-25: qty 10

## 2011-03-25 NOTE — Progress Notes (Signed)
CSN: [id number], This office note has been dictated.

## 2011-03-26 NOTE — Progress Notes (Signed)
CC:   [place], [place], [address], [place], [place]  [address]  DIAGNOSES: 1. 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 2. History of idiopathic pulmonary embolism. 3. Chemotherapy-induced neuropathy/dementia.  CURRENT THERAPY:  Observation.  INTERIM HISTORY:  Ms. Ravan comes in for followup.  She is doing okay.  We last saw her a couple months ago.  She is off Coumadin now. She has not started low-dose aspirin.  I told her that I thought this would be a good idea for her.  I really saw very little downside to doing the low-dose aspirin.  She was seen by Dr. Beulah Gandy.  He did a cystoscopy on her.  She really does not know the results.  She is not going back to see him.  She did have a repeat PET scan.  This was done back in July.  The PET scan was negative.  Her neuropathy is about the same.  She is on Lyrica and Cymbalta for this.  Aricept has helped her mental acuity.  She seems more alert.  She has had some abdominal pain.  She has noticed some fullness in the lower abdomen.  She has also noticed some decreased appetite.  She says that she does get full a little more easily.  She has had no leg swelling.  She has had some joint aches.  She has had no fever.  PHYSICAL EXAMINATION:  General:  This is a well-developed well-nourished black female in no obvious distress.  Vital Signs:  Temperature 97.5, pulse 98, respiratory rate 18, blood pressure 122/93.  Weight is 241. Head/Neck:  Normocephalic, atraumatic skull.  There are no ocular or oral lesions.  There are no palpable cervical or supraclavicular lymph nodes.  Lungs:  Clear bilaterally.  Cardiac:  Regular rate and rhythm with normal S1, S2.  There are no murmurs rubs, or bruits.  Abdomen: Soft with good bowel sounds.  There is no palpable abdominal mass. There is some fullness in the supraumbilical region.  There is some slight tenderness to palpation below the  umbilicus.  There is a questionable fluid wave.  There is no palpable hepatosplenomegaly. Extremities:  No clubbing, cyanosis or edema.  She is hypersensitive to touch in her feet.  Skin:  No rashes, ecchymosis or petechiae.  LABORATORY STUDIES:  White cell count is 4.7, hemoglobin 12.8, hematocrit 37.5, platelet count 195.  IMPRESSION:  Ms. Colonna is a 50 year old African American female with a poorly differentiated carcinoma.  I felt that this was ovarian. She underwent genetic array analysis.  [facility] assay showed this to be highly likely to be ovarian cancer.  She completed chemotherapy with carboplatin/Abraxane back in May 2010.  We will have to follow up with the cystoscopy that she had.  Again, [name] is not sure exactly what Dr. Janice Norrie found.  For now, we will plan to get another PET scan on her.  Her last one was back in July.  Because she is unknown primary, she is at risk for recurrence.  I will plan see her back myself after the new year.  We will go ahead and flush her Port-A-Cath when we see her back.    ______________________________ [name], M.D. PRE/MEDQ  D:  03/25/2011  T:  03/25/2011  Job:  [id number]

## 2011-03-29 ENCOUNTER — Telehealth: Payer: Self-pay | Admitting: *Deleted

## 2011-03-29 NOTE — Telephone Encounter (Signed)
Left pt message with 05-29-11 1pm PET scan appointment at [facility] and to be NPO 6 hrs and MD appointment on 1-23

## 2011-04-15 ENCOUNTER — Other Ambulatory Visit: Payer: Self-pay | Admitting: *Deleted

## 2011-04-15 DIAGNOSIS — C801 Malignant (primary) neoplasm, unspecified: Secondary | ICD-10-CM

## 2011-04-15 MED ORDER — DIAZEPAM 2 MG PO TABS: ORAL_TABLET | ORAL | Status: DC

## 2011-04-15 NOTE — Telephone Encounter (Signed)
Received refill fax request from [facility] for Valium

## 2011-04-25 ENCOUNTER — Other Ambulatory Visit: Payer: Self-pay | Admitting: Internal Medicine

## 2011-04-29 ENCOUNTER — Other Ambulatory Visit: Payer: Self-pay | Admitting: *Deleted

## 2011-04-29 DIAGNOSIS — G629 Polyneuropathy, unspecified: Secondary | ICD-10-CM

## 2011-04-29 MED ORDER — PREGABALIN 150 MG PO CAPS
150.0000 mg | ORAL_CAPSULE | Freq: Two times a day (BID) | ORAL | Status: DC
Start: 2011-04-29 — End: 2011-11-29

## 2011-04-29 NOTE — Telephone Encounter (Signed)
Received call from pt stating she needs refill for Lyrica called into [facility]. Called in as this is a long-term medication for her and she is experiencing pain (chemo induced neuropathy).

## 2011-05-27 ENCOUNTER — Other Ambulatory Visit: Payer: Self-pay | Admitting: *Deleted

## 2011-05-27 DIAGNOSIS — C801 Malignant (primary) neoplasm, unspecified: Secondary | ICD-10-CM

## 2011-05-27 MED ORDER — DIAZEPAM 2 MG PO TABS: ORAL_TABLET | ORAL | Status: DC

## 2011-05-27 MED ORDER — TRIAMTERENE-HCTZ 37.5-25 MG PO TABS: 2.0000 | ORAL_TABLET | Freq: Every day | ORAL | Status: DC

## 2011-05-29 ENCOUNTER — Encounter (HOSPITAL_COMMUNITY)
Admission: RE | Admit: 2011-05-29 | Discharge: 2011-05-29 | Disposition: A | Discharge status: Home / Self Care | Payer: Medicare Other | Source: Ambulatory Visit | Attending: Hematology & Oncology | Admitting: Hematology & Oncology

## 2011-05-29 ENCOUNTER — Encounter (HOSPITAL_COMMUNITY): Payer: Self-pay

## 2011-05-29 DIAGNOSIS — C801 Malignant (primary) neoplasm, unspecified: Secondary | ICD-10-CM | POA: Insufficient documentation

## 2011-05-29 MED ORDER — FLUDEOXYGLUCOSE F - 18 (FDG) INJECTION
16.4000 | Freq: Once | INTRAVENOUS | Status: AC | PRN
  Administered 2011-05-29: 16.4 via INTRAVENOUS

## 2011-05-31 ENCOUNTER — Telehealth: Payer: Self-pay | Admitting: Hematology & Oncology

## 2011-05-31 NOTE — Telephone Encounter (Signed)
Pt moved 1-22 to 2-20

## 2011-06-03 ENCOUNTER — Other Ambulatory Visit: Payer: Self-pay | Admitting: *Deleted

## 2011-06-03 DIAGNOSIS — G4700 Insomnia, unspecified: Secondary | ICD-10-CM

## 2011-06-03 DIAGNOSIS — F4323 Adjustment disorder with mixed anxiety and depressed mood: Secondary | ICD-10-CM

## 2011-06-03 DIAGNOSIS — C801 Malignant (primary) neoplasm, unspecified: Secondary | ICD-10-CM

## 2011-06-03 MED ORDER — ZOLPIDEM TARTRATE 10 MG PO TABS: 10.0000 mg | ORAL_TABLET | Freq: Every evening | ORAL | Status: DC | PRN

## 2011-06-03 NOTE — Telephone Encounter (Signed)
Received refill request from [facility] for Ambien 10 mg tabs. Routed to Dr Marin Olp for approval.

## 2011-06-04 NOTE — Telephone Encounter (Signed)
Rx was faxed to [facility]

## 2011-06-05 ENCOUNTER — Other Ambulatory Visit: Payer: Managed Care, Other (non HMO) | Admitting: Lab

## 2011-06-05 ENCOUNTER — Ambulatory Visit: Payer: Managed Care, Other (non HMO) | Admitting: Hematology & Oncology

## 2011-06-25 ENCOUNTER — Other Ambulatory Visit: Payer: Self-pay | Admitting: Internal Medicine

## 2011-07-03 ENCOUNTER — Ambulatory Visit (HOSPITAL_BASED_OUTPATIENT_CLINIC_OR_DEPARTMENT_OTHER): Payer: Medicare Other | Admitting: Hematology & Oncology

## 2011-07-03 ENCOUNTER — Other Ambulatory Visit (HOSPITAL_BASED_OUTPATIENT_CLINIC_OR_DEPARTMENT_OTHER): Payer: Medicare Other | Admitting: Lab

## 2011-07-03 ENCOUNTER — Ambulatory Visit: Payer: Managed Care, Other (non HMO)

## 2011-07-03 DIAGNOSIS — C569 Malignant neoplasm of unspecified ovary: Secondary | ICD-10-CM

## 2011-07-03 DIAGNOSIS — F329 Major depressive disorder, single episode, unspecified: Secondary | ICD-10-CM

## 2011-07-03 DIAGNOSIS — C801 Malignant (primary) neoplasm, unspecified: Secondary | ICD-10-CM

## 2011-07-03 DIAGNOSIS — G589 Mononeuropathy, unspecified: Secondary | ICD-10-CM

## 2011-07-03 DIAGNOSIS — G4700 Insomnia, unspecified: Secondary | ICD-10-CM

## 2011-07-03 LAB — CBC WITH DIFFERENTIAL (CANCER CENTER ONLY)
BASO#: 0 10*3/uL (ref 0.0–0.2)
EOS%: 0.6 % (ref 0.0–7.0)
HCT: 45 % (ref 34.8–46.6)
HGB: 15.4 g/dL (ref 11.6–15.9)
MCH: 29.8 pg (ref 26.0–34.0)
MCHC: 34.2 g/dL (ref 32.0–36.0)
MONO%: 7.1 % (ref 0.0–13.0)
NEUT%: 64.4 % (ref 39.6–80.0)

## 2011-07-03 LAB — COMPREHENSIVE METABOLIC PANEL
AST: 29 U/L (ref 0–37)
Alkaline Phosphatase: 99 U/L (ref 39–117)
BUN: 26 mg/dL — ABNORMAL HIGH (ref 6–23)
Calcium: 10.1 mg/dL (ref 8.4–10.5)
Chloride: 101 mEq/L (ref 96–112)
Creatinine, Ser: 1.13 mg/dL — ABNORMAL HIGH (ref 0.50–1.10)
Glucose, Bld: 98 mg/dL (ref 70–99)

## 2011-07-03 MED ORDER — HEPARIN SOD (PORK) LOCK FLUSH 100 UNIT/ML IV SOLN
500.0000 [IU] | Freq: Once | INTRAVENOUS | Status: AC
  Administered 2011-07-03: 500 [IU] via INTRAVENOUS
  Filled 2011-07-03: qty 5

## 2011-07-03 MED ORDER — SODIUM CHLORIDE 0.9 % IJ SOLN
10.0000 mL | INTRAMUSCULAR | Status: DC | PRN
  Filled 2011-07-03: qty 10

## 2011-07-03 MED ORDER — HEPARIN SOD (PORK) LOCK FLUSH 100 UNIT/ML IV SOLN
500.0000 [IU] | Freq: Once | INTRAVENOUS | Status: AC | PRN
  Administered 2011-07-03: 500 [IU] via INTRAVENOUS

## 2011-07-03 MED ORDER — SODIUM CHLORIDE 0.9 % IJ SOLN: 10.0000 mL | INTRAMUSCULAR | Status: DC | PRN

## 2011-07-03 NOTE — Progress Notes (Signed)
This office note has been dictated.

## 2011-07-04 ENCOUNTER — Telehealth: Payer: Self-pay | Admitting: Hematology & Oncology

## 2011-07-04 NOTE — Telephone Encounter (Signed)
Dr. Ferdinand Lango is aware of referral from Dr. Marin Olp and will call Patient. Patient aware Dr. Ferdinand Lango will call her. Patient also aware of 4-10 MD appointment she didn't want to come in 6 weeks its her birthday.

## 2011-07-04 NOTE — Progress Notes (Signed)
CC:   [name], M.D. Gates Rigg MD, [address] [place],  [address]  [name], M.D.  DIAGNOSIS: 1. Poorly-differentiated carcinoma of unclear etiology, clinical     remission. 2. History of pulmonary embolism. 3. Chemotherapy-induced neuropathy. 4. Progressive dementia/short-term memory loss.  CURRENT THERAPY:  Observation.  INTERIM HISTORY:  [name] comes in for follow.  Unfortunately, she is quite emotional today.  She feels that she has to go see a "therapist."  She appears to be getting more depressed.  I am not sure what else we can do to try to help her out.  She is on Cymbalta.  She is on Aricept.  Her memory appears to be a problem for her.  She has neuropathy which has been chronic.  She seems to be dealing with this.  She is not sleeping.  She, I think, is on Ambien.  She states she is on Ativan.  She is on Valium.  We did do a PET scan on her; this was done on 05/29/2011.  The PET scan did not show any evidence of recurrent disease.  She essentially has been free of disease recurrence now for almost 3 years.  She has had no bleeding.  She has had no nausea or vomiting.  She just has lost a lot of interest in things that she would like to do.  Hopefully, when she goes down to [name] next week, she will feel better.  She saw Dr. Janice [name].  She had a cystoscopy.  From what she says, Dr. Janice [name] did not see any problems.  She has not had any cough.  There has been no headache.  There has been no leg swelling.  She has had no fevers, sweats or chills.  PHYSICAL EXAMINATION:  General:  This is a well-developed, well- nourished African American female in no obvious distress.  Vital Signs: 97.7, pulse 88, respiratory rate 16, blood pressure 110/70.  Weight is 234.  Head and Neck Exam:  Shows a normocephalic, atraumatic skull. There are no ocular or oral lesions.  There are no palpable cervical or supraclavicular lymph nodes.   Lungs:  Clear bilaterally.  Cardiac Exam: Regular rate and rhythm with normal S1 and S2.  There are no murmurs, rubs or bruits.  Abdominal Exam:  Soft with good bowel sounds.  There is no palpable abdominal mass.  There is no palpable hepatosplenomegaly. Back Exam:  No tenderness over the spine, ribs or hips.  Extremities: Show no clubbing, cyanosis, or edema.  She has good range of motion of her joints.  Skin Exam:  No rashes, ecchymosis or petechiae. Neurological Exam:  Does show the increased sensitivity to touch in her lower legs.  LABORATORY STUDIES:  White cell count 6.3, hemoglobin 15.4, hematocrit 45, platelet count 201.  IMPRESSION:  [name] is a 51 year old African American female with a history of a poorly-differentiated carcinoma.  I felt that this was ovarian in origin.  We did do the tissue typing on this.  This did seem to suggest ovarian cancer.  We treated her as ovarian cancer.  We subsequently have gotten her free of disease now for almost 3 years.  We will see about getting her to [name].  I think this would be a good start for her.  [name] inquired about having her mammogram, colonoscopy, female exams.  I told her these are all necessary for her.  Again, we will see if [name] can help [place] out.  If not, then I am sure Dr. Ferdinand [name] can make recommendations as to who might be able to help out [name].  I do not see that we have got to do any additional scans on [name] unless she has some type of clinical change.  I spent over 45 minutes with her today.  I just felt bad that she is having these issues.    ______________________________ [name], M.D. PRE/MEDQ  D:  07/03/2011  T:  07/04/2011  Job:  [id number]

## 2011-07-09 ENCOUNTER — Other Ambulatory Visit: Payer: Self-pay | Admitting: Hematology & Oncology

## 2011-07-09 DIAGNOSIS — Z1231 Encounter for screening mammogram for malignant neoplasm of breast: Secondary | ICD-10-CM

## 2011-07-10 ENCOUNTER — Other Ambulatory Visit: Payer: Self-pay | Admitting: *Deleted

## 2011-07-10 DIAGNOSIS — C801 Malignant (primary) neoplasm, unspecified: Secondary | ICD-10-CM

## 2011-07-10 NOTE — Telephone Encounter (Signed)
Received refill authorization for Diazepam 2 mg.  It was last filled on 05/27/11. Dr Marin Olp signed paper copy from [facility]. It was faxed back to [phone].

## 2011-07-11 MED ORDER — DIAZEPAM 2 MG PO TABS: ORAL_TABLET | ORAL | Status: DC

## 2011-07-16 ENCOUNTER — Telehealth: Payer: Self-pay | Admitting: Hematology & Oncology

## 2011-07-16 NOTE — Telephone Encounter (Signed)
Faxed completed disability form and requested records to [facility]

## 2011-07-19 ENCOUNTER — Ambulatory Visit (INDEPENDENT_AMBULATORY_CARE_PROVIDER_SITE_OTHER): Payer: Medicare Other | Admitting: Internal Medicine

## 2011-07-19 ENCOUNTER — Encounter: Payer: Self-pay | Admitting: Internal Medicine

## 2011-07-19 VITALS — BP 120/80 | HR 78 | Wt 245.0 lb

## 2011-07-19 DIAGNOSIS — E876 Hypokalemia: Secondary | ICD-10-CM

## 2011-07-19 DIAGNOSIS — F17200 Nicotine dependence, unspecified, uncomplicated: Secondary | ICD-10-CM

## 2011-07-19 DIAGNOSIS — Z9884 Bariatric surgery status: Secondary | ICD-10-CM

## 2011-07-19 DIAGNOSIS — I10 Essential (primary) hypertension: Secondary | ICD-10-CM

## 2011-07-19 DIAGNOSIS — C801 Malignant (primary) neoplasm, unspecified: Secondary | ICD-10-CM

## 2011-07-19 DIAGNOSIS — R609 Edema, unspecified: Secondary | ICD-10-CM

## 2011-07-19 DIAGNOSIS — R32 Unspecified urinary incontinence: Secondary | ICD-10-CM | POA: Insufficient documentation

## 2011-07-19 DIAGNOSIS — F4323 Adjustment disorder with mixed anxiety and depressed mood: Secondary | ICD-10-CM

## 2011-07-19 DIAGNOSIS — E349 Endocrine disorder, unspecified: Secondary | ICD-10-CM

## 2011-07-19 LAB — BASIC METABOLIC PANEL
BUN: 18 mg/dL (ref 6–23)
Calcium: 9 mg/dL (ref 8.4–10.5)
Creatinine, Ser: 1 mg/dL (ref 0.4–1.2)
GFR: 76.97 mL/min (ref >60.00)
Glucose, Bld: 74 mg/dL (ref 70–99)
Sodium: 141 mEq/L (ref 135–145)

## 2011-07-19 LAB — HEPATIC FUNCTION PANEL
ALT: 31 U/L (ref 0–35)
AST: 27 U/L (ref 0–37)
Albumin: 3.6 g/dL (ref 3.5–5.2)
Alkaline Phosphatase: 69 U/L (ref 39–117)
Total Protein: 6.3 g/dL (ref 6.0–8.3)

## 2011-07-19 LAB — POCT URINALYSIS DIPSTICK
Glucose, UA: NEGATIVE
Leukocytes, UA: NEGATIVE
Nitrite, UA: NEGATIVE
Spec Grav, UA: 1.02
Urobilinogen, UA: 0.2

## 2011-07-19 LAB — MAGNESIUM: Magnesium: 1.5 mg/dL (ref 1.5–2.5)

## 2011-07-19 NOTE — Progress Notes (Signed)
Subjective:    Patient ID: [name], female    DOB: 1961-01-26, 51 y.o.   MRN: [id number]  HPI Patient comes in today for SDA for  new problem evaluation. Last visit was  6 12 for PV  Concern about increasing edema in LE over the last week . Left more than right  Better today but ankles swollen without pain or injury thinks she is up 7-8 pounds.  No Cp sob changes in meds. Is on 2 maxide 35 per day for bp control increased per dr Marin Olp.   Still chronic problem with "Hard to urinate "   Has had  cystoscopy    No longer on coumadin stopped July    supposed to be on asa. nott taking yet   "Drinking a lot of water and not urinating it out. "  Lower back pain.   No flank pain           Review of Systems NO fever falling new neuro sx . Bleeding leg pain or streaking. Some redness on leg right more than left. Mood difficult  Sleeping is an issue. Valium at this time per oncology   And ambien. On lyrica over a year for neuropathy  Past history family history social history reviewed in the electronic medical record.     Objective:   Physical Exam WDWN in nad  Looks well   HEEENT Byron perrl eoms nl op clear tm,s no acute changes  Neck: Supple without adenopathy or masses or bruits Chest:  Clear to A&P without wheezes rales or rhonchi CV:  S1-S2 no gallops or murmurs peripheral perfusion is normal Abdomen:  Sof,t normal bowel sounds without hepatosplenomegaly, no guarding rebound or masses no CVA tenderness EXTR no weakness   1 + edema bilaterally with some erythema almost like sunburn left more than right no streakking pain or chords   Lab Results  Component Value Date   WBC 6.3 07/03/2011   HGB 15.4 07/03/2011   HCT 45.0 07/03/2011   PLT 201 07/03/2011   GLUCOSE 98 07/03/2011   CHOL 157 08/21/2010   TRIG 132.0 08/21/2010   HDL 42.90 08/21/2010   LDLDIRECT 98.7 09/04/2009   LDLCALC 88 08/21/2010   ALT 24 07/03/2011   AST 29 07/03/2011   NA 138 07/03/2011   K 3.3* 07/03/2011   CL 101  07/03/2011   CREATININE 1.13* 07/03/2011   BUN 26* 07/03/2011   CO2 26 07/03/2011   TSH 2.227 01/31/2011   TSH 2.227 01/31/2011   TSH 2.227 01/31/2011   TSH 2.227 01/31/2011   INR 3.1 11/28/2010        Assessment & Plan:  Edema   LE  Painless  Reported better today bilateral  But asymmetric Concern about cause  Doubt obstructive phenom  Unsure what to make of the erythema on left leg look s a bit like sun effect.  No obv infection.  Obesity prob contributes   Lyrica could do this but not a new med.  HT On double maxide and low K   Will check this  May need to readjust this   For  Metabolic effects  Increase k ot bid   1+ prot in urine and recheck Hx of uro eval in past.  Hx of PE now off coumadin for over 6 months  Cancer poss ovarian  n remission    Depressive sx  To see counselor .  Agree  Caution iwht valium and aambien type meds  Hx of bariatric surgery  Tobacco should stop.

## 2011-07-19 NOTE — Patient Instructions (Addendum)
Unsure why you have swelling but doesn't look like obstruction at this point  If getting a lot worse then call for advice.   Your potassium is a bit low  And. Would increase to   2 per day.    Make sure you are not ingesting hidden sodium   In the  Meantime.   We may change your blood pressure  Diuretic to something like chorthalidone  And potassium

## 2011-07-20 ENCOUNTER — Encounter: Payer: Self-pay | Admitting: Internal Medicine

## 2011-07-21 LAB — URINE CULTURE: Colony Count: 10000

## 2011-07-22 ENCOUNTER — Other Ambulatory Visit: Payer: Self-pay | Admitting: *Deleted

## 2011-07-22 ENCOUNTER — Other Ambulatory Visit: Payer: Self-pay | Admitting: Internal Medicine

## 2011-07-22 DIAGNOSIS — F329 Major depressive disorder, single episode, unspecified: Secondary | ICD-10-CM

## 2011-07-22 MED ORDER — ESCITALOPRAM OXALATE 20 MG PO TABS: 20.0000 mg | ORAL_TABLET | Freq: Every day | ORAL | Status: DC

## 2011-07-24 ENCOUNTER — Ambulatory Visit (INDEPENDENT_AMBULATORY_CARE_PROVIDER_SITE_OTHER): Payer: 59 | Admitting: Psychiatry

## 2011-07-24 DIAGNOSIS — F0630 Mood disorder due to known physiological condition, unspecified: Secondary | ICD-10-CM

## 2011-07-24 DIAGNOSIS — F17200 Nicotine dependence, unspecified, uncomplicated: Secondary | ICD-10-CM

## 2011-07-25 ENCOUNTER — Inpatient Hospital Stay: Admission: RE | Admit: 2011-07-25 | Payer: Medicare Other | Source: Ambulatory Visit

## 2011-07-25 NOTE — Progress Notes (Signed)
07-25-2011  Patient seen for initial psychological evaluation.  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  There are also many unresolved childhood issues that need addressing.  The patient appears motivated for treatment.  Her next appointment is 08-01-2011.

## 2011-07-31 ENCOUNTER — Telehealth: Payer: Self-pay | Admitting: *Deleted

## 2011-07-31 NOTE — Telephone Encounter (Signed)
Pt wants to know when should she follow back up with you as far as her labs go

## 2011-08-01 ENCOUNTER — Ambulatory Visit (INDEPENDENT_AMBULATORY_CARE_PROVIDER_SITE_OTHER): Payer: 59 | Admitting: Psychiatry

## 2011-08-01 DIAGNOSIS — F0630 Mood disorder due to known physiological condition, unspecified: Secondary | ICD-10-CM

## 2011-08-01 DIAGNOSIS — F17200 Nicotine dependence, unspecified, uncomplicated: Secondary | ICD-10-CM

## 2011-08-01 NOTE — Progress Notes (Signed)
08-01-2011  Patient seen for individual psychotherapy.  Session focused on patient's history and current issues.  She reports feeling more hopeful after last session and did more this week.  Slept better. Suggested patient have her MD check her for high cortisol levels.  Next appointment 08-07-2011.

## 2011-08-02 NOTE — Telephone Encounter (Signed)
Repeat  Her BMP  And magnesium in 3 weeks and then OV.  Dx HT and hypokalemia

## 2011-08-05 ENCOUNTER — Other Ambulatory Visit: Payer: Self-pay | Admitting: Internal Medicine

## 2011-08-05 DIAGNOSIS — I10 Essential (primary) hypertension: Secondary | ICD-10-CM

## 2011-08-05 DIAGNOSIS — E876 Hypokalemia: Secondary | ICD-10-CM

## 2011-08-05 NOTE — Telephone Encounter (Signed)
Called pt to make aware and pt states she cannot set these appointments up at this time. Pt instructed to call back to set appts up.

## 2011-08-07 ENCOUNTER — Ambulatory Visit: Payer: Medicare Other | Admitting: Psychiatry

## 2011-08-08 ENCOUNTER — Ambulatory Visit (INDEPENDENT_AMBULATORY_CARE_PROVIDER_SITE_OTHER): Payer: 59 | Admitting: Psychiatry

## 2011-08-08 DIAGNOSIS — F0630 Mood disorder due to known physiological condition, unspecified: Secondary | ICD-10-CM

## 2011-08-08 DIAGNOSIS — F17200 Nicotine dependence, unspecified, uncomplicated: Secondary | ICD-10-CM

## 2011-08-13 NOTE — Progress Notes (Signed)
08-08-2011  Patient seen for individual psychotherapy.  Session focused on patient's marriage and past marital issues.  Next appointment 08-14-2011.

## 2011-08-14 ENCOUNTER — Telehealth: Payer: Self-pay | Admitting: Internal Medicine

## 2011-08-14 ENCOUNTER — Ambulatory Visit (INDEPENDENT_AMBULATORY_CARE_PROVIDER_SITE_OTHER): Payer: 59 | Admitting: Psychiatry

## 2011-08-14 ENCOUNTER — Ambulatory Visit: Payer: Medicare Other | Admitting: Hematology & Oncology

## 2011-08-14 DIAGNOSIS — F17200 Nicotine dependence, unspecified, uncomplicated: Secondary | ICD-10-CM

## 2011-08-14 DIAGNOSIS — F0630 Mood disorder due to known physiological condition, unspecified: Secondary | ICD-10-CM

## 2011-08-14 NOTE — Telephone Encounter (Signed)
Patient called stating that her psychologist wants to change her anti-depressant and patient was instructed to call and speak with the nurse or MD. Please assist.

## 2011-08-14 NOTE — Progress Notes (Signed)
08-14-2011  Patient seen for individual psychotherapy.  Session focused on her history of cancer and treatment.  Recommended patient stop all electronics 3 hours before bedtime to help her with insomnia.  Patient continues to have depression - doesn't open mail, house is a mess, stays in her bedroom all day.  Recommended she talk with her primary MD about changing her antidepressant.  Next appointment 08-21-2011.

## 2011-08-16 NOTE — Telephone Encounter (Signed)
Patient states that she sees Dr Ferdinand Lango at the cancer center.  Dr Ferdinand Lango feels that the patient's anti-depressant Lexapro should be changed because it is no longer working.  Would you like for this ptient to come in for an office visit?

## 2011-08-16 NOTE — Telephone Encounter (Signed)
Yes Advise OV to discuss

## 2011-08-19 ENCOUNTER — Encounter: Payer: Self-pay | Admitting: Hematology & Oncology

## 2011-08-19 ENCOUNTER — Other Ambulatory Visit: Payer: Self-pay | Admitting: *Deleted

## 2011-08-19 ENCOUNTER — Ambulatory Visit: Payer: Medicare Other

## 2011-08-19 ENCOUNTER — Ambulatory Visit (HOSPITAL_BASED_OUTPATIENT_CLINIC_OR_DEPARTMENT_OTHER): Payer: Medicare Other | Admitting: Hematology & Oncology

## 2011-08-19 VITALS — BP 140/105 | HR 104 | Temp 97.1°F | Ht 69.0 in | Wt 233.0 lb

## 2011-08-19 DIAGNOSIS — C801 Malignant (primary) neoplasm, unspecified: Secondary | ICD-10-CM

## 2011-08-19 DIAGNOSIS — F4323 Adjustment disorder with mixed anxiety and depressed mood: Secondary | ICD-10-CM

## 2011-08-19 DIAGNOSIS — Z86718 Personal history of other venous thrombosis and embolism: Secondary | ICD-10-CM

## 2011-08-19 DIAGNOSIS — G4700 Insomnia, unspecified: Secondary | ICD-10-CM

## 2011-08-19 DIAGNOSIS — G589 Mononeuropathy, unspecified: Secondary | ICD-10-CM

## 2011-08-19 MED ORDER — LORAZEPAM 0.5 MG PO TABS: 0.5000 mg | ORAL_TABLET | Freq: Three times a day (TID) | ORAL | Status: DC | PRN

## 2011-08-19 MED ORDER — HEPARIN SOD (PORK) LOCK FLUSH 100 UNIT/ML IV SOLN
500.0000 [IU] | Freq: Once | INTRAVENOUS | Status: AC | PRN
  Administered 2011-08-19: 500 [IU] via INTRAVENOUS
  Filled 2011-08-19: qty 5

## 2011-08-19 MED ORDER — ALTEPLASE 2 MG IJ SOLR
2.0000 mg | Freq: Once | INTRAMUSCULAR | Status: DC | PRN
  Filled 2011-08-19: qty 2

## 2011-08-19 MED ORDER — ZOLPIDEM TARTRATE 10 MG PO TABS: 10.0000 mg | ORAL_TABLET | Freq: Every evening | ORAL | Status: DC | PRN

## 2011-08-19 MED ORDER — DIAZEPAM 2 MG PO TABS: ORAL_TABLET | ORAL | Status: DC

## 2011-08-19 MED ORDER — SODIUM CHLORIDE 0.9 % IJ SOLN
10.0000 mL | INTRAMUSCULAR | Status: DC | PRN
  Administered 2011-08-19: 10 mL via INTRAVENOUS
  Filled 2011-08-19: qty 10

## 2011-08-19 NOTE — Progress Notes (Signed)
This office note has been dictated.

## 2011-08-19 NOTE — Progress Notes (Signed)
PAC per right chest accessed with #20 G Huber needle without difficulty .  Positive for blood return.  Flush.

## 2011-08-20 ENCOUNTER — Encounter: Payer: Self-pay | Admitting: Internal Medicine

## 2011-08-20 ENCOUNTER — Ambulatory Visit (INDEPENDENT_AMBULATORY_CARE_PROVIDER_SITE_OTHER): Payer: Medicare Other | Admitting: Internal Medicine

## 2011-08-20 ENCOUNTER — Telehealth: Payer: Self-pay | Admitting: Hematology & Oncology

## 2011-08-20 VITALS — BP 126/74 | HR 119 | Temp 98.2°F | Wt 233.0 lb

## 2011-08-20 DIAGNOSIS — C801 Malignant (primary) neoplasm, unspecified: Secondary | ICD-10-CM

## 2011-08-20 DIAGNOSIS — Z9884 Bariatric surgery status: Secondary | ICD-10-CM

## 2011-08-20 DIAGNOSIS — G622 Polyneuropathy due to other toxic agents: Secondary | ICD-10-CM

## 2011-08-20 DIAGNOSIS — G479 Sleep disorder, unspecified: Secondary | ICD-10-CM | POA: Insufficient documentation

## 2011-08-20 DIAGNOSIS — G478 Other sleep disorders: Secondary | ICD-10-CM

## 2011-08-20 DIAGNOSIS — L658 Other specified nonscarring hair loss: Secondary | ICD-10-CM

## 2011-08-20 DIAGNOSIS — F4323 Adjustment disorder with mixed anxiety and depressed mood: Secondary | ICD-10-CM

## 2011-08-20 MED ORDER — DULOXETINE HCL 60 MG PO CPEP: 60.0000 mg | ORAL_CAPSULE | Freq: Every day | ORAL | Status: DC

## 2011-08-20 NOTE — Telephone Encounter (Signed)
Pt aware of 5-20 appointment

## 2011-08-20 NOTE — Patient Instructions (Addendum)
I dont have  Dr Johny Chess notes in the EHR.  We can change your medication for  Depression. Valium and ativan can add to depression but helps with anxiety.   You could have bipolar depression that could be  resistant to typical meds and I suggest  We get a psychiatry medication consult.  Ibn the meantime change to cymbalta 60 mg per day . May have a week or so of transition.  Will  Get appt for Dr Delman Cheadle to see your about the bald spot .

## 2011-08-20 NOTE — Progress Notes (Signed)
Subjective:    Patient ID: [name], female    DOB: 1961/03/22, 51 y.o.   MRN: [id number]  HPI  Comes in for follow up of  multiple medical issues Sent in by counselor becaue of depression is continuing and consider evaluation for change in medication. She is seeing Dr. Ferdinand Lango about every week at this time this was on the advice of her oncologist.  She states she has decreased motivation tends to stay in her room all day less energy.  She has had a long-term evaluation for sleep problem with Dr. Jamison Oka in the past and had numbers of sleep studies medications etc. however she has difficulty falling asleep and staying asleep. If she could go to bed at 2 aM and get a at 10 AM she would do okay  .   She also has some balding on the top of her head the dizziness only from chemotherapy and she is no longer on this. Dr. Ferdinand Lango suggested we check this.  There's been no major changes in her medicine in the last 6 months.  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 Her cancer appears to be in remission.    Still using some tobacco no alcohol of significance. Past history family history social history reviewed in the electronic medical record.   Outpatient Prescriptions Prior to Visit  Medication Sig Dispense Refill  . diazepam (VALIUM) 2 MG tablet Take 1 tablet by mouth at bedtime if no success repeat once one hour later  60 tablet  3  . donepezil (ARICEPT) 10 MG tablet Take 10 mg by mouth at bedtime.       Marland Kitchen escitalopram (LEXAPRO) 20 MG tablet Take 1 tablet (20 mg total) by mouth daily.  30 tablet  3  . KLOR-CON 10 10 MEQ tablet TAKE 1 TABLET BY MOUTH DAILY  30 tablet  2  . LORazepam (ATIVAN) 0.5 MG tablet Take 1 tablet (0.5 mg total) by mouth every 8 (eight) hours as needed.  90 tablet  3  . orphenadrine (NORFLEX) 100 MG tablet Take 100 mg by mouth 2 (two) times daily. Take 2 once a day        . pregabalin (LYRICA) 150 MG capsule Take 1 capsule (150 mg total) by mouth 2 (two) times daily.  60 capsule  3  . simvastatin (ZOCOR) 40 MG tablet TAKE 1 TABLET BY MOUTH EVERY DAY  30 tablet  2  . triamterene-hydrochlorothiazide (MAXZIDE-25) 37.5-25 MG per tablet Take 2 each (2 tablets total) by mouth daily.  60 tablet  3  . zolpidem (AMBIEN) 10 MG tablet Take 1 tablet (10 mg total) by mouth at bedtime as needed.  30 tablet  3   Facility-Administered Medications Prior to Visit  Medication Dose Route Frequency Provider Last Rate Last Dose  . heparin lock flush 100 unit/mL  500 Units Intravenous Once PRN Golden Pop, FNP   500 Units at 08/19/11 1530  . alteplase (CATHFLO ACTIVASE) injection 2 mg  2 mg Intracatheter Once PRN Golden Pop, FNP      . sodium chloride 0.9 % injection 10 mL  10 mL Intravenous PRN Golden Pop, FNP   10 mL at 08/19/11 1530       Objective:   Physical Exam BP 126/74  Pulse 119  Temp(Src) 98.2 F (36.8 C) (Oral)  Wt 233 lb (105.688 kg)  SpO2 96% Well-developed well-nourished adequate eye  contact normal speech no motor slowing or increased motor activity.  Scalp  shows about a 3 cm alopecia with no obvious scarring. This is at the vertex  Lab Results  Component Value Date   WBC 6.3 07/03/2011   HGB 15.4 07/03/2011   HCT 45.0 07/03/2011   PLT 201 07/03/2011   GLUCOSE 74 07/19/2011   CHOL 157 08/21/2010   TRIG 132.0 08/21/2010   HDL 42.90 08/21/2010   LDLDIRECT 98.7 09/04/2009   LDLCALC 88 08/21/2010   ALT 31 07/19/2011   AST 27 07/19/2011   NA 141 07/19/2011   K 3.6 07/19/2011   CL 104 07/19/2011   CREATININE 1.0 07/19/2011   BUN 18 07/19/2011   CO2 29 07/19/2011   TSH 2.27 07/19/2011   INR 3.1 11/28/2010      Assessment & Plan:    Depression decreased motivation.  Was on Lexapro initially when she got the diagnosis of cancer and this helped her get through transition time. However she has continued decreased motivation symptoms and depression symptoms. She  has ongoing sleep problems evaluated in the past.  At one time stayed up for 2.5 days  Brought up the possibility of bipolar depression also in which case I think we should get a consult from psychiatry. Patient is willing she does want to feel better.  She has been given valium and ativan and ambien by her other specialits and this combo is not optimum and sets  Up for  polypharmacy.  And se  Risk.   Trial of cymbalta and 21 samples given adn rx and close fu  Refer to psychiatry for med consult.  History of side effects from chemotherapy  polyneuropathy and chemotherapy brain.   Localized alopecia uncertain etiology no new medications last labs unrevealing done a month or so ago.   Get her derm dr Delman Cheadle to check  Cancer of unknown primary in remission stable     Total visit 90mns > 50% spent counseling and coordinating care  Med and record review.

## 2011-08-20 NOTE — Progress Notes (Signed)
CC:   Standley Brooking. [name] Bill, MD Larey Seat, M.D.  DIAGNOSES: 1. Poorly differentiated carcinoma of unknown etiology. 2. History of pulmonary embolism. 3. Chemotherapy-induced neuropathy.  CURRENT THERAPY:  Observation.  INTERVAL HISTORY:  [name] comes in for followup.  She is doing a little bit better from a mental point of view.  She is seeing Dr. Kyra Leyland over at the [facility].  Dr. Ferdinand Lango has made some good suggestions to [name] regarding her personal life.  This really has affected [name].  As such, she may be getting an adjustment with her medications.  Dr. Regis [name] will be handling this.  She has had some pain just below the umbilicus.  I am not sure really what this might be coming from.  I did go ahead and give her some Lidoderm samples that she could try.  It maybe some kind of nerve issue.  She has had a decent appetite.  There has been no nausea or vomiting. She has had no change in bowel or bladder habits.  She still is bothered quite a bit by neuropathy in her feet and hands.  This has been a chronic problem for her.  She is doing her best to try to be functional with this.  PHYSICAL EXAMINATION:  This is a fairly well-developed, well-nourished black female in no obvious distress.  Vital signs:  97.1, pulse 104, respiratory rate 18, blood pressure 142/98.  Weight is 233.  Head and neck:  Normocephalic, atraumatic skull.  There are no ocular or oral lesions.  There are no palpable cervical or supraclavicular lymph nodes. Lungs:  Clear bilaterally.  Cardiac:  Regular rate and rhythm with a normal S1 and S2.  There are no murmurs or rubs, or bruits.  Abdomen: Soft.  She has a well-healed laparotomy scar.  There is some tenderness in the hypo-umbilical region.  There is right in the midline.  No erythema or mass is noted in this area.  There is no fluid wave.  There is no palpable hepatosplenomegaly.  Extremities shows tenderness  to palpation in her feet and lower legs.  She has good pulses in her distal extremities.  Skin: No  rashes, ecchymosis or petechia.  Neurologic:  No focal neurological deficits.  LABORATORY STUDIES:  Pending.  IMPRESSION:  [name] is a 51 year old African American female with a past history of a poorly differentiated carcinoma of unclear etiology.  I felt that this was most likely ovarian.  We did do DNA testing, which seem to suggest ovarian cancer.  We treated her for this. She received carboplatin/Taxotere.  She has been free of disease now for I think over 3 years.  Unfortunately she suffered from the neuropathy from chemo.  She also has had some cognitive issues from the chemotherapy.  Dr. Ferdinand Lango is helping her out and [name] is quite thankful for this.  It sounds like she does have a way to go before she can really start feeling like her old self.  I do not see a need to do any scans on her right now.  I do not believe that she has any evidence of recurrence right now.  We will have [name] come back to see [place] in about 6 weeks, just so we can see how she is doing.  She had her Port-A-Cath flushed.    ______________________________ [name], M.D. PRE/MEDQ  D:  08/19/2011  T:  08/20/2011  Job:  [id number]

## 2011-08-21 ENCOUNTER — Ambulatory Visit (INDEPENDENT_AMBULATORY_CARE_PROVIDER_SITE_OTHER): Payer: 59 | Admitting: Psychiatry

## 2011-08-21 DIAGNOSIS — F0630 Mood disorder due to known physiological condition, unspecified: Secondary | ICD-10-CM

## 2011-08-21 DIAGNOSIS — F17200 Nicotine dependence, unspecified, uncomplicated: Secondary | ICD-10-CM

## 2011-08-21 NOTE — Progress Notes (Signed)
08-21-2011  Patient seen for individual psychotherapy.  Session focused on patient's issues with her father and the meaning of intimacy in relationships.  She talked with her PCP and is now on 60 mg. cymbalta and has been referred to a psychiatrist for additional consultation and meds.  The patient is walking 20 to 30 minutes a day and decreasing her TV watching of violent shows to help with her sleep architecture. Next appointment 08-28-2011.

## 2011-08-28 ENCOUNTER — Ambulatory Visit (INDEPENDENT_AMBULATORY_CARE_PROVIDER_SITE_OTHER): Payer: 59 | Admitting: Psychiatry

## 2011-08-28 DIAGNOSIS — F0630 Mood disorder due to known physiological condition, unspecified: Secondary | ICD-10-CM

## 2011-08-28 DIAGNOSIS — F17200 Nicotine dependence, unspecified, uncomplicated: Secondary | ICD-10-CM

## 2011-08-28 NOTE — Progress Notes (Signed)
08-28-2011  Patient seen for individual psychotherapy.  Session focused on patient's enabling behaviors with her "adopted son" and her biological son.  Explained the [employer] of Victim-Rescuer-Persecutor to patient.  Encouraged her to detach and allow them to grow up through adversity.  Next appointment 09-04-2011.

## 2011-09-04 ENCOUNTER — Ambulatory Visit (INDEPENDENT_AMBULATORY_CARE_PROVIDER_SITE_OTHER): Payer: 59 | Admitting: Psychiatry

## 2011-09-04 DIAGNOSIS — F0630 Mood disorder due to known physiological condition, unspecified: Secondary | ICD-10-CM

## 2011-09-04 DIAGNOSIS — F17200 Nicotine dependence, unspecified, uncomplicated: Secondary | ICD-10-CM

## 2011-09-04 NOTE — Progress Notes (Signed)
09-04-2011  Patient seen for individual psychotherapy.  Session focused on patient's primitive issues of abandonment. Helped patient explore origins and ways to counter these fears.  Next appointment 09-25-2011.

## 2011-09-10 ENCOUNTER — Ambulatory Visit (INDEPENDENT_AMBULATORY_CARE_PROVIDER_SITE_OTHER): Payer: Medicare Other | Admitting: Internal Medicine

## 2011-09-10 ENCOUNTER — Encounter: Payer: Self-pay | Admitting: Internal Medicine

## 2011-09-10 VITALS — BP 122/80 | HR 113 | Temp 98.4°F | Wt 228.0 lb

## 2011-09-10 DIAGNOSIS — C801 Malignant (primary) neoplasm, unspecified: Secondary | ICD-10-CM

## 2011-09-10 DIAGNOSIS — F4323 Adjustment disorder with mixed anxiety and depressed mood: Secondary | ICD-10-CM

## 2011-09-10 DIAGNOSIS — G622 Polyneuropathy due to other toxic agents: Secondary | ICD-10-CM

## 2011-09-10 MED ORDER — DESVENLAFAXINE SUCCINATE ER 50 MG PO TB24: 50.0000 mg | ORAL_TABLET | Freq: Every day | ORAL | Status: DC

## 2011-09-10 NOTE — Progress Notes (Signed)
  Subjective:    Patient ID: [name], female    DOB: [birth year], 51 y.o.   MRN: [id number]  HPI  Pt comes in today for FU  After 3 weeks on meds.  On cymbalta 60  Now  About the same in the depression mood area  but now reoccuring areas of anger  At this time .  Unsure if med related or external causes.  Mom having surgery soon and son moving out.  External issues  Going on.  IRS etc.  Lorazepam for heart palpitations. Unable to get psych appt one didn't take medicare and another required deposit up front.  Review of Systems No CP SOB  Bleeding       Objective:   Physical Exam  BP 122/80  Pulse 113  Temp(Src) 98.4 F (36.9 C) (Oral)  Wt 228 lb (103.42 kg)  SpO2 97% Good eye contact well groomed  Nl speech and attention no tremor.  Not tearful.       Assessment & Plan:  Depressive sx   Not better and  ? If cymbalta helping or side effect.    Taking prn lorazepam.   Am still concerned about poss bipolar disease  But unsure .  May need a mood stabilizer  Samples of pristiqu 50 21 given and will try to arrange referral although she needs to make own appt.  Will be out of town with mom for 2 weeks.   Total visit 39mns > 50% spent counseling and coordinating care

## 2011-09-10 NOTE — Patient Instructions (Signed)
Still advise we get  A psychiatry consult .   This could be a bipolar depression type issue.  For now change to pristiq once a day and

## 2011-09-11 ENCOUNTER — Ambulatory Visit: Payer: 59 | Admitting: Psychiatry

## 2011-09-23 ENCOUNTER — Telehealth: Payer: Self-pay | Admitting: Family Medicine

## 2011-09-23 ENCOUNTER — Encounter (HOSPITAL_COMMUNITY): Payer: Self-pay | Admitting: Emergency Medicine

## 2011-09-23 ENCOUNTER — Emergency Department (HOSPITAL_COMMUNITY)
Admission: EM | Admit: 2011-09-23 | Discharge: 2011-09-24 | Disposition: A | Discharge status: Home / Self Care | Payer: Medicare Other | Attending: Emergency Medicine | Admitting: Emergency Medicine

## 2011-09-23 DIAGNOSIS — I7300 Raynaud's syndrome without gangrene: Secondary | ICD-10-CM | POA: Insufficient documentation

## 2011-09-23 DIAGNOSIS — L2989 Other pruritus: Secondary | ICD-10-CM | POA: Insufficient documentation

## 2011-09-23 DIAGNOSIS — I10 Essential (primary) hypertension: Secondary | ICD-10-CM | POA: Insufficient documentation

## 2011-09-23 DIAGNOSIS — R42 Dizziness and giddiness: Secondary | ICD-10-CM | POA: Insufficient documentation

## 2011-09-23 DIAGNOSIS — E785 Hyperlipidemia, unspecified: Secondary | ICD-10-CM | POA: Insufficient documentation

## 2011-09-23 DIAGNOSIS — T7840XA Allergy, unspecified, initial encounter: Secondary | ICD-10-CM | POA: Insufficient documentation

## 2011-09-23 DIAGNOSIS — L298 Other pruritus: Secondary | ICD-10-CM | POA: Insufficient documentation

## 2011-09-23 DIAGNOSIS — R21 Rash and other nonspecific skin eruption: Secondary | ICD-10-CM | POA: Insufficient documentation

## 2011-09-23 DIAGNOSIS — R070 Pain in throat: Secondary | ICD-10-CM | POA: Insufficient documentation

## 2011-09-23 DIAGNOSIS — F341 Dysthymic disorder: Secondary | ICD-10-CM | POA: Insufficient documentation

## 2011-09-23 DIAGNOSIS — Z79899 Other long term (current) drug therapy: Secondary | ICD-10-CM | POA: Insufficient documentation

## 2011-09-23 DIAGNOSIS — Z86718 Personal history of other venous thrombosis and embolism: Secondary | ICD-10-CM | POA: Insufficient documentation

## 2011-09-23 DIAGNOSIS — R062 Wheezing: Secondary | ICD-10-CM | POA: Insufficient documentation

## 2011-09-23 LAB — POCT I-STAT, CHEM 8
Creatinine, Ser: 1.1 mg/dL (ref 0.50–1.10)
Hemoglobin: 14.3 g/dL (ref 12.0–15.0)
Sodium: 141 mEq/L (ref 135–145)
TCO2: 28 mmol/L (ref 0–100)

## 2011-09-23 LAB — RAPID STREP SCREEN (MED CTR MEBANE ONLY): Streptococcus, Group A Screen (Direct): NEGATIVE

## 2011-09-23 LAB — CBC
MCHC: 33.4 g/dL (ref 30.0–36.0)
Platelets: 207 10*3/uL (ref 150–400)
RDW: 14.6 % (ref 11.5–15.5)

## 2011-09-23 MED ORDER — METHYLPREDNISOLONE SODIUM SUCC 125 MG IJ SOLR
125.0000 mg | Freq: Once | INTRAMUSCULAR | Status: AC
  Administered 2011-09-23: 125 mg via INTRAVENOUS
  Filled 2011-09-23: qty 2

## 2011-09-23 MED ORDER — FAMOTIDINE IN NACL 20-0.9 MG/50ML-% IV SOLN
20.0000 mg | Freq: Once | INTRAVENOUS | Status: AC
  Administered 2011-09-23: 20 mg via INTRAVENOUS
  Filled 2011-09-23: qty 50

## 2011-09-23 MED ORDER — DIPHENHYDRAMINE HCL 50 MG/ML IJ SOLN
12.5000 mg | Freq: Once | INTRAMUSCULAR | Status: AC
  Administered 2011-09-23: 12.5 mg via INTRAVENOUS
  Filled 2011-09-23: qty 1

## 2011-09-23 MED ORDER — KETOROLAC TROMETHAMINE 30 MG/ML IJ SOLN
30.0000 mg | Freq: Once | INTRAMUSCULAR | Status: AC
  Administered 2011-09-23: 30 mg via INTRAVENOUS
  Filled 2011-09-23: qty 1

## 2011-09-23 MED ORDER — SODIUM CHLORIDE 0.9 % IV BOLUS (SEPSIS)
500.0000 mL | Freq: Once | INTRAVENOUS | Status: AC
  Administered 2011-09-23: 500 mL via INTRAVENOUS

## 2011-09-23 NOTE — Telephone Encounter (Signed)
Left message on machine for patient to go to the ER.

## 2011-09-23 NOTE — Telephone Encounter (Signed)
Pulled from Triage vmail. States she wants to get in with Dr. Regis Bill. I didn't know if she meant today, or? Thinks she may be having allergic reaction, but she doesn't know to what. Throat feels swollen, she's dizzy, etc. I tried to call her at the # she left - there was no answer. Please advise. Pt called at 4:37pm

## 2011-09-23 NOTE — ED Notes (Signed)
Pt alert, nad, c/o itching, scratchy throat, onset was yesterday, pt believes allergic rxn to "oranges", resp even unlabored, no stridor noted, skin pwd, pt has mult raised reddened areas, non open, non draining,  appears "infestation/ poison ivy like"

## 2011-09-23 NOTE — ED Provider Notes (Signed)
History     CSN: [id number]  Arrival date & time 09/23/11  2024   First MD Initiated Contact with Patient 09/23/11 2110      Chief Complaint  Patient presents with  . Allergic Reaction    (Consider location/radiation/quality/duration/timing/severity/associated sxs/prior treatment) HPI Comments: Patient states yesterday.  She noticed some red spots on her right wrist.  This was just after she was outside fishing on a talk through the night.  She developed more red spots that are itchy.  He also has developed throat pain, and externally, not internally that is painful to touch, and she's feeling a heaviness in her chest and wheezing.  She took Claritin without any relief.  She called her primary care doctor, Dr. not to recommend she come to the emergency room for further evaluation.  She states she did not see any insects biting her on the jaw at the mosquitoes were not out in [place]  Patient is a 51 y.o. female presenting with allergic reaction. The history is provided by the patient.  Allergic Reaction The primary symptoms are  dizziness and rash. The primary symptoms do not include wheezing, shortness of breath, cough, abdominal pain, palpitations, angioedema or urticaria. The current episode started yesterday. The problem has been gradually worsening. This is a new problem.  Dizziness does not occur with weakness.    Past Medical History  Diagnosis Date  . Hx pulmonary embolism   . Obesity   . Metrorrhagia   . Allergic rhinitis   . Iron deficiency anemia   . 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 Hypertension   . Alopecia   . Vitamin d deficiency   . HLD (hyperlipidemia)   . Adjustment disorder   . Anxiety and depression   . Squamous cell carcinoma of skin   . Raynaud's syndrome   . Right groin mass     Past Surgical History  Procedure Date  . Breast reduction surgery 2000  . Gastric bypass 1979  . Tonsillectomy   . Cesarean section    1991  . Abdominal hysterectomy 05/2008    TAH/BSO  . Colonoscopy 2004; 12/07/10    hemorrhoids    Family History  Problem Relation Age of Onset  . Pulmonary embolism Mother   . Hypertension Father   . Breast cancer Maternal Grandmother   . Hyperlipidemia      History  Substance Use Topics  . Smoking status: Current Everyday Smoker -- 1.0 packs/day    Types: Cigarettes  . Smokeless tobacco: Not on file  . Alcohol Use: Yes     rare    OB History    Grav Para Term Preterm Abortions TAB SAB Ect Mult Living                  Review of Systems  Constitutional: Negative for fever and chills.  HENT: Negative for sore throat and trouble swallowing.   Respiratory: Negative for cough, shortness of breath and wheezing.   Cardiovascular: Negative for palpitations.  Gastrointestinal: Negative for abdominal pain.  Musculoskeletal: Negative for myalgias and joint swelling.  Skin: Positive for rash.  Neurological: Positive for dizziness. Negative for seizures and weakness.    Allergies  Codeine and Orange  Home Medications   Current Outpatient Rx  Name Route Sig Dispense Refill  . DESVENLAFAXINE SUCCINATE ER 50 MG PO TB24 Oral Take 1 tablet (50 mg total) by mouth daily. 21 tablet 0  . DIAZEPAM 2 MG PO  TABS  Take 1 tablet by mouth at bedtime if no success repeat once one hour later 60 tablet 3  . DONEPEZIL HCL 10 MG PO TABS Oral Take 10 mg by mouth every morning.     Marland Kitchen LORATADINE 10 MG PO TABS Oral Take 10 mg by mouth once.    Marland Kitchen LORAZEPAM 0.5 MG PO TABS Oral Take 0.5 mg by mouth every 8 (eight) hours as needed. Anxiety/sleep.    . ORPHENADRINE CITRATE 100 MG PO TB12 Oral Take 100 mg by mouth 2 (two) times daily.     Marland Kitchen POTASSIUM CHLORIDE ER 10 MEQ PO TBCR      . PREGABALIN 150 MG PO CAPS Oral Take 1 capsule (150 mg total) by mouth 2 (two) times daily. 60 capsule 3  . SIMVASTATIN 40 MG PO TABS  TAKE 1 TABLET BY MOUTH EVERY DAY 30 tablet 2  . TRIAMTERENE-HCTZ 37.5-25 MG PO TABS Oral  Take 2 tablets by mouth daily.    Marland Kitchen ZOLPIDEM TARTRATE 10 MG PO TABS Oral Take 10 mg by mouth at bedtime as needed. Sleep.    Marland Kitchen FAMOTIDINE 20 MG PO TABS Oral Take 1 tablet (20 mg total) by mouth 2 (two) times daily. 30 tablet 0  . PREDNISONE 10 MG PO TABS Oral Take 2 tablets (20 mg total) by mouth daily. 15 tablet 0    BP 122/82  Pulse 90  Temp(Src) 97.6 F (36.4 C) (Oral)  Resp 24  SpO2 95%  Physical Exam  Constitutional: She is oriented to person, place, and time. She appears well-developed and well-nourished.  HENT:  Head: Normocephalic. No trismus in the jaw.  Mouth/Throat: Uvula is midline and mucous membranes are normal. Mucous membranes are not pale and not dry. No oral lesions. No oropharyngeal exudate, posterior oropharyngeal edema, posterior oropharyngeal erythema or tonsillar abscesses.  Neck: Normal range of motion.  Cardiovascular: Normal rate.   Pulmonary/Chest: Effort normal.  Musculoskeletal: She exhibits no edema and no tenderness.  Neurological: She is alert and oriented to person, place, and time.  Skin: Rash noted.       Diffuse intermittent, red, raised lesions on extremities, upper chest and upper back without surrounding erythema    ED Course  Procedures (including critical care time)  Labs Reviewed  POCT I-STAT, CHEM 8 - Abnormal; Notable for the following:    Potassium 3.0 (*)    All other components within normal limits  CBC  RAPID STREP SCREEN   No results found.   1. Allergic reaction     1:12 AM.  Patient, states she's feeling, better, she's no longer having any chest tightness, or itching.  MDM  The "rash" appear to be insect bites, as they're very diffuse in nature, without surrounding erythema.  This patient is very anxious        Garald Balding, NP 09/24/11 0143  Garald Balding, NP 09/24/11 [phone]

## 2011-09-23 NOTE — ED Notes (Signed)
RN accessing port to draw labs.

## 2011-09-24 MED ORDER — FAMOTIDINE 20 MG PO TABS: 20.0000 mg | ORAL_TABLET | Freq: Two times a day (BID) | ORAL | Status: DC

## 2011-09-24 MED ORDER — PREDNISONE 10 MG PO TABS: 20.0000 mg | ORAL_TABLET | Freq: Every day | ORAL | Status: DC

## 2011-09-24 MED ORDER — HEPARIN SOD (PORK) LOCK FLUSH 100 UNIT/ML IV SOLN
INTRAVENOUS | Status: AC
  Filled 2011-09-24: qty 5

## 2011-09-24 NOTE — ED Provider Notes (Signed)
Medical screening examination/treatment/procedure(s) were performed by non-physician practitioner and as supervising physician I was immediately available for consultation/collaboration.  Carmin Muskrat, MD 09/24/11 [phone]

## 2011-09-24 NOTE — Discharge Instructions (Signed)
Allergic Reaction, Mild to Moderate Allergies may happen from anything your body is sensitive to. This may be food, medications, pollens, chemicals, and nearly anything around you in everyday life that produces allergens. An allergen is anything that causes an allergy producing substance. Allergens cause your body to release allergic antibodies. Through a chain of events, they cause a release of histamine into the blood stream. Histamines are meant to protect you, but they also cause your discomfort. This is why antihistamines are often used for allergies. Heredity is often a factor in causing allergic reactions. This means you may have some of the same allergies as your parents. Allergies happen in all age groups. You may have some idea of what caused your reaction. There are many allergens around [place]. It may be difficult to know what caused your reaction. If this is a first time event, it may never happen again. Allergies cannot be cured but can be controlled with medications. SYMPTOMS  You may get some or all of the following problems from allergies.  Swelling and itching in and around the mouth.   Tearing, itchy eyes.   Nasal congestion and runny nose.   Sneezing and coughing.   An itchy red rash or hives.   Vomiting or diarrhea.   Difficulty breathing.  Seasonal allergies occur in all age groups. They are seasonal because they usually occur during the same season every year. They may be a reaction to molds, grass pollens, or tree pollens. Other causes of allergies are house dust mite allergens, pet dander and mold spores. These are just a common few of the thousands of allergens around [place]. All of the symptoms listed above happen when you come in contact with pollens and other allergens. Seasonal allergies are usually not life threatening. They are generally more of a nuisance that can often be handled using medications. Hay fever is a combination of all or some of the above listed allergy  problems. It may often be treated with simple over-the-counter medications such as diphenhydramine. Take medication as directed. Check with your caregiver or package insert for child dosages. TREATMENT AND HOME CARE INSTRUCTIONS If hives or rash are present:  Take medications as directed.   You may use an over-the-counter antihistamine (diphenhydramine) for hives and itching as needed. Do not drive or drink alcohol until medications used to treat the reaction have worn off. Antihistamines tend to make people sleepy.   Apply cold cloths (compresses) to the skin or take baths in cool water. This will help itching. Avoid hot baths or showers. Heat will make a rash and itching worse.   If your allergies persist and become more severe, and over the counter medications are not effective, there are many new medications your caretaker can prescribe. Immunotherapy or desensitizing injections can be used if all else fails. Follow up with your caregiver if problems continue.  SEEK MEDICAL CARE IF:   Your allergies are becoming progressively more troublesome.   You suspect a food allergy. Symptoms generally happen within 30 minutes of eating a food.   Your symptoms have not gone away within 2 days or are getting worse.   You develop new symptoms.   You want to retest yourself or your child with a food or drink you think causes an allergic reaction. Never test yourself or your child of a suspected allergy without being under the watchful eye of your caregivers. A second exposure to an allergen may be life-threatening.  SEEK IMMEDIATE MEDICAL CARE IF:  You  develop difficulty breathing or wheezing, or have a tight feeling in your chest or throat.   You develop a swollen mouth, hives, swelling, or itching all over your body.  A severe reaction with any of the above problems should be considered life-threatening. If you suddenly develop difficulty breathing call for local emergency medical help. THIS IS AN  EMERGENCY. MAKE SURE YOU:   Understand these instructions.   Will watch your condition.   Will get help right away if you are not doing well or get worse.  Document Released: 02/24/2007 Document Revised: 04/18/2011 Document Reviewed: 02/24/2007 [facility] Patient Information 2012 [place]. In the given a prescription for Pepcid, as well as prednisone.  Please take these as directed until completed follow up with her primary care doctor for further evaluation if you again developed shortness of breath.  Increase in your itchiness or chest discomfort.  Return to the emergency department for further evaluation

## 2011-09-25 ENCOUNTER — Ambulatory Visit (INDEPENDENT_AMBULATORY_CARE_PROVIDER_SITE_OTHER): Payer: 59 | Admitting: Psychiatry

## 2011-09-25 DIAGNOSIS — Z638 Other specified problems related to primary support group: Secondary | ICD-10-CM

## 2011-09-25 DIAGNOSIS — F0630 Mood disorder due to known physiological condition, unspecified: Secondary | ICD-10-CM

## 2011-09-25 NOTE — Progress Notes (Signed)
09-25-2011  Patient seen for individual psychotherapy.  She began [facility] classes on Monday and has agreed to switch to electric cigarette and join the next set of Smoking Cessation Classes at [facility].  Next appointment 10-02-2011.

## 2011-09-30 ENCOUNTER — Ambulatory Visit: Payer: Medicare Other | Admitting: Hematology & Oncology

## 2011-09-30 ENCOUNTER — Other Ambulatory Visit: Payer: Medicare Other | Admitting: Lab

## 2011-10-02 ENCOUNTER — Ambulatory Visit: Payer: 59 | Admitting: Psychiatry

## 2011-10-09 ENCOUNTER — Ambulatory Visit (INDEPENDENT_AMBULATORY_CARE_PROVIDER_SITE_OTHER): Payer: 59 | Admitting: Psychiatry

## 2011-10-09 DIAGNOSIS — F0630 Mood disorder due to known physiological condition, unspecified: Secondary | ICD-10-CM

## 2011-10-09 DIAGNOSIS — F17200 Nicotine dependence, unspecified, uncomplicated: Secondary | ICD-10-CM

## 2011-10-09 NOTE — Progress Notes (Signed)
10-09-2011  Patient seen for individual psychotherapy.  Session focused on encouraging patient to quit smoking and to get involved in yoga or tai chi classes to help her recovery.  Next appointment 10-16-2011.

## 2011-10-15 ENCOUNTER — Telehealth: Payer: Self-pay | Admitting: Internal Medicine

## 2011-10-15 NOTE — Telephone Encounter (Signed)
Pt needs a form signed for the [employer] Program  Pt needs this asap and is hoping to have paper signed when she brings it up here. Pt requesting to be contacted with the best time for her to bring form up. Did not want to drop off and pick up

## 2011-10-15 NOTE — Telephone Encounter (Signed)
Per Dr. Regis Bill called pt to see what the form states.  Pt states the form just needs that the pt can participate in the fitness program and she qualifies since she is a cancer survivor.  Pt aware to bring form to office.

## 2011-10-16 ENCOUNTER — Ambulatory Visit (INDEPENDENT_AMBULATORY_CARE_PROVIDER_SITE_OTHER): Payer: 59 | Admitting: Psychiatry

## 2011-10-16 ENCOUNTER — Telehealth: Payer: Self-pay

## 2011-10-16 DIAGNOSIS — F17200 Nicotine dependence, unspecified, uncomplicated: Secondary | ICD-10-CM

## 2011-10-16 DIAGNOSIS — F0630 Mood disorder due to known physiological condition, unspecified: Secondary | ICD-10-CM

## 2011-10-16 NOTE — Progress Notes (Signed)
10-16-2011  Patient seen for individual psychotherapy.  She c/o foot neuropathy and has had three falls recently.  Encouraged her to talk with her MD. Discussed means to increase her sense of joy. Next appointment 10-23-2011.

## 2011-10-16 NOTE — Telephone Encounter (Signed)
Called pt to make aware form is ready for pick up.

## 2011-10-23 ENCOUNTER — Ambulatory Visit (INDEPENDENT_AMBULATORY_CARE_PROVIDER_SITE_OTHER): Payer: 59 | Admitting: Psychiatry

## 2011-10-23 DIAGNOSIS — F17200 Nicotine dependence, unspecified, uncomplicated: Secondary | ICD-10-CM

## 2011-10-23 DIAGNOSIS — F0630 Mood disorder due to known physiological condition, unspecified: Secondary | ICD-10-CM

## 2011-10-23 NOTE — Progress Notes (Signed)
10-23-2011  Patient seen for individual psychotherapy.  Session focused on tobacco use and patient's need to address this if she is to regain her health and avoid further health issues.  Patient c/o insomnia, fatigue and depressed mood.  Recommended patient talk with her primary MD about lamictal as a mood stabilizer since the use of antidepressants have not be efficacious for her.  Next appointment 11-06-2011.

## 2011-10-30 ENCOUNTER — Ambulatory Visit (INDEPENDENT_AMBULATORY_CARE_PROVIDER_SITE_OTHER): Payer: 59 | Admitting: Psychiatry

## 2011-10-30 DIAGNOSIS — F0630 Mood disorder due to known physiological condition, unspecified: Secondary | ICD-10-CM

## 2011-10-30 DIAGNOSIS — F17200 Nicotine dependence, unspecified, uncomplicated: Secondary | ICD-10-CM

## 2011-10-30 NOTE — Progress Notes (Signed)
10-30-2011  Patient seen for individual psychotherapy.  Session focused on her goals for treatment and her self-image.  Next appointment 11-06-2011.

## 2011-11-06 ENCOUNTER — Ambulatory Visit: Payer: Medicare Other | Admitting: Psychiatry

## 2011-11-06 NOTE — Progress Notes (Signed)
11-06-2011  Patient cancelled today's appointment because she is feeling ill. Next appointment 11-27-2011.

## 2011-11-11 ENCOUNTER — Ambulatory Visit: Payer: Managed Care, Other (non HMO)

## 2011-11-11 ENCOUNTER — Ambulatory Visit (HOSPITAL_BASED_OUTPATIENT_CLINIC_OR_DEPARTMENT_OTHER): Payer: Managed Care, Other (non HMO) | Admitting: Hematology & Oncology

## 2011-11-11 ENCOUNTER — Other Ambulatory Visit: Payer: Managed Care, Other (non HMO) | Admitting: Lab

## 2011-11-11 VITALS — BP 119/88 | HR 84 | Temp 97.8°F | Ht 69.0 in | Wt 230.0 lb

## 2011-11-11 DIAGNOSIS — D649 Anemia, unspecified: Secondary | ICD-10-CM

## 2011-11-11 DIAGNOSIS — C801 Malignant (primary) neoplasm, unspecified: Secondary | ICD-10-CM

## 2011-11-11 DIAGNOSIS — G589 Mononeuropathy, unspecified: Secondary | ICD-10-CM

## 2011-11-11 DIAGNOSIS — M810 Age-related osteoporosis without current pathological fracture: Secondary | ICD-10-CM

## 2011-11-11 DIAGNOSIS — R635 Abnormal weight gain: Secondary | ICD-10-CM

## 2011-11-11 DIAGNOSIS — R4589 Other symptoms and signs involving emotional state: Secondary | ICD-10-CM

## 2011-11-11 DIAGNOSIS — G4700 Insomnia, unspecified: Secondary | ICD-10-CM

## 2011-11-11 MED ORDER — DULOXETINE HCL 60 MG PO CPEP: ORAL_CAPSULE | ORAL | Status: DC

## 2011-11-11 MED ORDER — ZOLPIDEM TARTRATE ER 12.5 MG PO TBCR: 12.5000 mg | EXTENDED_RELEASE_TABLET | Freq: Every evening | ORAL | Status: DC | PRN

## 2011-11-11 NOTE — Progress Notes (Signed)
This office note has been dictated.

## 2011-11-12 ENCOUNTER — Telehealth: Payer: Self-pay | Admitting: *Deleted

## 2011-11-12 NOTE — Progress Notes (Signed)
CC:   Standley Brooking. Regis Bill, MD Larey Seat, M.D.  DIAGNOSES: 1. 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 2. History of pulmonary embolism. 3. Chemotherapy-induced neuropathy/dementia.  CURRENT THERAPY:  Observation.  INTERIM HISTORY:  [name] comes in for a visit.  She is still having a lot of issues.  She is not too happy with having to see Dr. Ferdinand Lango right now.  She does not feel as if Dr. Ferdinand Lango is making any positive impact on her life.  She wants to see somebody else.  I am not sure who else we would refer her to outside of a psychiatrist.  I think Dr. Regis Bill can certainly handle that part.  She also states that she just can walk and fall down.  Whether this is neuropathy is hard to say.  She is on Lyrica for the neuropathy.  She is on Norflex, which can help her a little bit.  She said she is not sleeping that much.  She is on Valium.  We are going to stop the Valium.  She said Ambien helps her.  I will put her on Ambien CR.  She has not had a mammogram in a couple years.  We probably need to get 1 set up for her.  She also mentioned there was a possibility of having BRCA testing.  I think that since she did have what was felt to be ovarian cancer back in her 51s, the fact that her grandmother apparently died of breast cancer when she was young, [name] may qualify for BRCA testing.  Will refer her to a geneticist.  She also said she has a "knot" on her upper lip.  She has noticed this for a couple weeks or so.  She says it really does not hurt.  She wanted me to put a needle into her.  I told I was not going to do that.  She probably needs to be seen by a dentist to have that area evaluated thoroughly and properly. We will about making a referral to the dental clinic at [facility].  She is still smoking. She said she will see about going to smoking cessation classes after she gets back from  vacation.  She apparently had some issues with her son.  She says she "lost it " for a few days.  [name] seems to be having more emotional problems going on right now.  It is just hard to figure out how to help her out.  Again, she may need to be seen by psychiatry, which Dr. [name] Ruts can probably handle.  PHYSICAL EXAMINATION:  This is an obese black female in no obvious distress.  Vital signs:  97.8, pulse 84, respiratory rate 20, blood pressure 119/88.  Weight is 230.  Head and neck:  Normocephalic, atraumatic skull.  There is a firmness on her upper lip.  This is in the mid section of the upper lip.  There is no associated erythema with this.  It is probably just a few millimeters in size.  I do not see anything within the oral cavity.  Pupils react appropriately.  She has good extraocular muscle movement.  There is no adenopathy in her neck. Lungs:  Clear bilaterally.  Cardiac:  Regular rate and rhythm with a normal S1 and S2.  There are no murmurs, rubs or bruits.  Abdomen:  Soft with good bowel sounds.  She has a well-healed laparotomy scar.  There  is no fluid wave.  There is no guarding or rebound tenderness.  There is no palpable hepatosplenomegaly.  Breasts:  Bilateral reductions and implants.  There may be an area of firmness on the left breast right about the 1 o'clock position.  There is no tenderness to palpation over her breasts bilaterally.  There is no bilateral axillary adenopathy. Back:  No tenderness over the spine, ribs, or hips.  Extremities:  No clubbing, cyanosis or edema.  She does have some hypersensitivity to touch on her feet.  Neurological:  No obvious neurological deficits.  LABORATORY DATA:  Not done this visit.  IMPRESSION:  [name]. [name] is a 51 year old African American female with a history of a poorly differentiated carcinoma within the abdomen. This was felt to be ovarian cancer by molecular profiling.  She received chemo with  carboplatin/Taxotere.  She has been remission for I think close to 4 years.  She unfortunately has suffered neuropathy.  This appears to be ongoing. I do not know if this falling that she has is related to neuropathy.  She does have the cognitive issues.  I think she is on Aricept for this.  Again, she is dealing with a lot of an emotional problems and issues right now.  These will hopefully be dealt with through psychiatry.  We will see about making her a referral for a genetics clinic and a dental clinic.  Will also make an appointment for her to have a mammogram done.  She got her Port-A-Cath flushed today.  I spent close to an hour with her today.  Again, she is trying to deal with all these emotional issues that she really does not have anybody else to talk to about.  We will plan to get her back in couple months.  She will have her Port-A- Cath flushed at that point time.    ______________________________ [name], M.D. PRE/MEDQ  D:  11/11/2011  T:  11/12/2011  Job:  [id number]

## 2011-11-12 NOTE — Telephone Encounter (Signed)
Confirmed 01/02/12 genetic appt w/ pt.

## 2011-11-15 ENCOUNTER — Telehealth: Payer: Self-pay | Admitting: Hematology & Oncology

## 2011-11-15 NOTE — Telephone Encounter (Signed)
Left message with Dentist Clinic at [facility] for referral

## 2011-11-19 ENCOUNTER — Telehealth: Payer: Self-pay | Admitting: Hematology & Oncology

## 2011-11-19 NOTE — Telephone Encounter (Signed)
[name] at [facility] will contact Pt for Dental exam. Not sure when they will schedule her for

## 2011-11-20 ENCOUNTER — Ambulatory Visit: Payer: Medicare Other | Admitting: Psychiatry

## 2011-11-21 ENCOUNTER — Telehealth: Payer: Self-pay | Admitting: Hematology & Oncology

## 2011-11-21 NOTE — Telephone Encounter (Signed)
Pt aware of 8-12,22,and 9-16 appointments, times and locations. [facility]

## 2011-11-26 ENCOUNTER — Ambulatory Visit (HOSPITAL_COMMUNITY): Payer: Self-pay | Admitting: Dentistry

## 2011-11-26 ENCOUNTER — Encounter (HOSPITAL_COMMUNITY): Payer: Self-pay | Admitting: Dentistry

## 2011-11-26 VITALS — BP 116/84 | HR 71 | Temp 96.9°F

## 2011-11-26 DIAGNOSIS — R220 Localized swelling, mass and lump, head: Secondary | ICD-10-CM

## 2011-11-26 DIAGNOSIS — K036 Deposits [accretions] on teeth: Secondary | ICD-10-CM

## 2011-11-26 DIAGNOSIS — Z8543 Personal history of malignant neoplasm of ovary: Secondary | ICD-10-CM

## 2011-11-26 DIAGNOSIS — K0530 Chronic periodontitis, unspecified: Secondary | ICD-10-CM

## 2011-11-26 NOTE — Progress Notes (Signed)
DENTAL CONSULTATION  Date of Consultation:  11/26/2011 Patient Name:   [name] Date of Birth:   1961-01-27 Medical Record Number: [id number]  VITALS: BP 116/84  Pulse 71  Temp 96.9 F (36.1 C) (Oral)   CHIEF COMPLAINT: Patient referred by Dr. Marin Olp for evaluation of "knot on upper lip" .  HPI: Patient has a history of the upper lip swelling that has been present for aproximately one month. This is present at the midline by report. Patient has a history of some trauma with a fall approximately around the same time. Patient did not seek care for that trauma. Patient now feels the swelling is increasing in size and therefore wanted evaluation of this as soon as possible.  Patient currently denies acute toothache, swellings, or abscesses. Patient last saw her primary dentist, Dr. Lavella Hammock, about one year ago for an exam and cleaning.  PMH: Past Medical History  Diagnosis Date  . Hx pulmonary embolism   . Obesity   . Metrorrhagia   . Allergic rhinitis   . Iron deficiency anemia   . 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 Hypertension   . Alopecia   . Vitamin d deficiency   . HLD (hyperlipidemia)   . Adjustment disorder   . Anxiety and depression   . Squamous cell carcinoma of skin   . Raynaud's syndrome   . Right groin mass     PSH: Past Surgical History  Procedure Date  . Breast reduction surgery 2000  . Gastric bypass 1979  . Tonsillectomy   . Cesarean section     1991  . Abdominal hysterectomy 05/2008    TAH/BSO  . Colonoscopy 2004; 12/07/10    hemorrhoids    ALLERGIES: Allergies  Allergen Reactions  . Codeine     REACTION: Nausea  . Orange Itching and Rash    MEDICATIONS: Current Outpatient Prescriptions  Medication Sig Dispense Refill  . donepezil (ARICEPT) 10 MG tablet Take 10 mg by mouth every morning.       . DULoxetine (CYMBALTA) 60 MG capsule Take 1 pilll a day  30 capsule  6  . LORazepam (ATIVAN) 0.5 MG  tablet Take 0.5 mg by mouth every 8 (eight) hours as needed. Anxiety/sleep.      . orphenadrine (NORFLEX) 100 MG tablet Take 100 mg by mouth 2 (two) times daily.       . potassium chloride (KLOR-CON 10) 10 MEQ tablet       . pregabalin (LYRICA) 150 MG capsule Take 1 capsule (150 mg total) by mouth 2 (two) times daily.  60 capsule  3  . simvastatin (ZOCOR) 40 MG tablet TAKE 1 TABLET BY MOUTH EVERY DAY  30 tablet  2  . triamterene-hydrochlorothiazide (MAXZIDE-25) 37.5-25 MG per tablet Take 2 tablets by mouth daily.      Marland Kitchen zolpidem (AMBIEN CR) 12.5 MG CR tablet Take 1 tablet (12.5 mg total) by mouth at bedtime as needed for sleep.  30 tablet  3    LABS: Lab Results  Component Value Date   WBC 5.4 09/23/2011   HGB 14.3 09/23/2011   HCT 42.0 09/23/2011   MCV 89.0 09/23/2011   PLT 207 09/23/2011      Component Value Date/Time   NA 141 09/23/2011 2217   NA 139 12/22/2008 1354   K 3.0* 09/23/2011 2217   K 4.0 12/22/2008 1354   CL 103 09/23/2011 2217   CL 108 12/22/2008 1354   CO2 29  07/19/2011 1444   CO2 28 12/22/2008 1354   GLUCOSE 85 09/23/2011 2217   GLUCOSE 80 12/22/2008 1354   BUN 18 09/23/2011 2217   BUN 15 12/22/2008 1354   CREATININE 1.10 09/23/2011 2217   CREATININE 0.9 12/22/2008 1354   CALCIUM 9.0 07/19/2011 1444   CALCIUM 10.1 12/22/2008 1354   GFRNONAA 75.77 09/04/2009 0943   GFRAA  Value: >60        The eGFR has been calculated using the MDRD equation. This calculation has not been validated in all clinical situations. eGFR's persistently <60 mL/min signify possible Chronic Kidney Disease. 07/06/2009 1722   Lab Results  Component Value Date   INR 3.1 11/28/2010   INR 1.5* 10/16/2010   INR 1.95* 06/28/2010   PROTIME 37.2* 11/28/2010   PROTIME 18.0* 10/16/2010   PROTIME 32.4* 06/26/2010   No results found for this basename: PTT    SOCIAL HISTORY: History   Social History  . Marital Status: Divorced    Spouse Name: N/A    Number of Children: 1  . Years of Education: N/A   Occupational  History  .     Social History Main Topics  . Smoking status: Current Everyday Smoker -- 1.0 packs/day for 37 years    Types: Cigarettes  . Smokeless tobacco: Never Used  . Alcohol Use: Yes     rare  . Drug Use: No  . Sexually Active: Not on file   Other Topics Concern  . Not on file   Social History Narrative   Divorced1 son /1 adopted adult sonSales occupation on disability for nowRegular exercise-yes money stressDaily caffeine use 2-3    FAMILY HISTORY: Family History  Problem Relation Age of Onset  . Pulmonary embolism Mother   . Hypertension Father   . Breast cancer Maternal Grandmother   . Hyperlipidemia       REVIEW OF SYSTEMS: Reviewed with patient and postive as above.  DENTAL HISTORY: CHIEF COMPLAINT: Patient referred by Dr. Marin Olp for evaluation of "knot on upper lip" .  HPI: Patient has a history of the upper lip swelling that has been present for aproximately one month. This is present at the midline by report. Patient has a history of some trauma with a fall approximately around the same time. Patient did not seek care for that trauma. Patient now feels the swelling is increasing in size and therefore wanted evaluation of this as soon as possible.  Patient currently denies acute toothache, swellings, or abscesses. Patient last saw her primary dentist, Dr. Lavella Hammock, About one year ago for an exam and cleaning.  DENTAL EXAMINATION:  GENERAL: Patient is a well-developed, well-nourished female in no acute distress. HEAD AND NECK: There is no obvious submandibular lymphadenopathy. The patient denies acute TMJ symptoms today. INTRAORAL EXAM: Patient has normal saliva.  There is no evidence of abscess formation. I am able to palpate approximately a 6 mm round nodule at the midline.   This may represent some sort of salivary gland or mucus retention cyst. DENTITION: Patient has a generally intact dentition. PERIODONTAL: Patient has chronic periodontitis  with significant plaque and calculus accumulations and selective areas gingival recession. DENTAL CARIES/SUBOPTIMAL RESTORATIONS: No gross dental caries noted today. I would suggest a full series of dental radiographs as indicated by her primary dentist. ENDODONTIC: She currently denies acute pulpitis symptoms. 3 periapical radiographs were taken of the maxillary anterior area with no obvious signs of periapical pathology or radiolucency secondary to trauma to these teeth approximately one  month ago. CROWN AND BRIDGE: Patient has bridge on the lower right quadrant with less than ideal aesthetics but appears to be clinically acceptable. PROSTHODONTIC: No history of partial dentures. OCCLUSION: Patient with a poor occlusal scheme secondary to an end to end anterior occlusion as well as right posterior crossbite. She had multiple diastemas that appear to have an closed with resin restorations in the past.  RADIOGRAPHIC INTERPRETATION: Three maxillary anterior periapical radiographs.were obtained. There are multiple dental resins noted. There is no evidence of periapical pathology or radiolucency   ASSESSMENTS/PLAN: 1. Upper lip nodule at the midline that may represent a mucus retention cyst or possible other salivary gland cyst. Patient has been referred to Dr. Lucien Mons for evaluation and treatment as indicated. 2. Chronic periodontitis with calculus accumulations. Patient has been referred back to her primary dentist (Dr. Lavella Hammock) for evaluation and treatment as indicated.   Lenn Cal, DDS

## 2011-11-26 NOTE — Progress Notes (Signed)
11-26-2011  Patient called to cancel tomorrow's appointment and all future appointments.  She is not ready to work on quitting smoking cigarettes.

## 2011-11-27 ENCOUNTER — Ambulatory Visit: Payer: Managed Care, Other (non HMO) | Admitting: Psychiatry

## 2011-11-29 ENCOUNTER — Other Ambulatory Visit: Payer: Self-pay | Admitting: *Deleted

## 2011-11-29 DIAGNOSIS — M810 Age-related osteoporosis without current pathological fracture: Secondary | ICD-10-CM

## 2011-11-29 DIAGNOSIS — D649 Anemia, unspecified: Secondary | ICD-10-CM

## 2011-11-29 DIAGNOSIS — C801 Malignant (primary) neoplasm, unspecified: Secondary | ICD-10-CM

## 2011-11-29 DIAGNOSIS — C4490 Unspecified malignant neoplasm of skin, unspecified: Secondary | ICD-10-CM

## 2011-11-29 DIAGNOSIS — R635 Abnormal weight gain: Secondary | ICD-10-CM

## 2011-11-29 DIAGNOSIS — G4700 Insomnia, unspecified: Secondary | ICD-10-CM

## 2011-11-29 DIAGNOSIS — G629 Polyneuropathy, unspecified: Secondary | ICD-10-CM

## 2011-11-29 MED ORDER — ORPHENADRINE CITRATE ER 100 MG PO TB12: 100.0000 mg | ORAL_TABLET | Freq: Two times a day (BID) | ORAL | Status: DC

## 2011-11-29 MED ORDER — TRIAMTERENE-HCTZ 37.5-25 MG PO TABS: 2.0000 | ORAL_TABLET | Freq: Every day | ORAL | Status: DC

## 2011-11-29 MED ORDER — PREGABALIN 150 MG PO CAPS: 150.0000 mg | ORAL_CAPSULE | Freq: Two times a day (BID) | ORAL | Status: DC

## 2011-11-29 NOTE — Telephone Encounter (Signed)
Erroneous encounter

## 2011-12-04 ENCOUNTER — Ambulatory Visit: Payer: Managed Care, Other (non HMO) | Admitting: Psychiatry

## 2011-12-05 ENCOUNTER — Other Ambulatory Visit: Payer: Self-pay | Admitting: Family Medicine

## 2011-12-05 ENCOUNTER — Other Ambulatory Visit: Payer: Self-pay | Admitting: Internal Medicine

## 2011-12-05 DIAGNOSIS — R609 Edema, unspecified: Secondary | ICD-10-CM

## 2011-12-05 DIAGNOSIS — E785 Hyperlipidemia, unspecified: Secondary | ICD-10-CM

## 2011-12-05 DIAGNOSIS — I10 Essential (primary) hypertension: Secondary | ICD-10-CM

## 2011-12-05 MED ORDER — SIMVASTATIN 40 MG PO TABS: 40.0000 mg | ORAL_TABLET | Freq: Every day | ORAL | Status: DC

## 2011-12-23 ENCOUNTER — Ambulatory Visit (HOSPITAL_BASED_OUTPATIENT_CLINIC_OR_DEPARTMENT_OTHER): Payer: Managed Care, Other (non HMO)

## 2011-12-23 VITALS — BP 116/84 | HR 80 | Temp 98.6°F

## 2011-12-23 DIAGNOSIS — C801 Malignant (primary) neoplasm, unspecified: Secondary | ICD-10-CM

## 2011-12-23 DIAGNOSIS — Z452 Encounter for adjustment and management of vascular access device: Secondary | ICD-10-CM

## 2011-12-23 MED ORDER — SODIUM CHLORIDE 0.9 % IJ SOLN
10.0000 mL | INTRAMUSCULAR | Status: DC | PRN
  Administered 2011-12-23: 10 mL via INTRAVENOUS
  Filled 2011-12-23: qty 10

## 2011-12-23 MED ORDER — HEPARIN SOD (PORK) LOCK FLUSH 100 UNIT/ML IV SOLN
500.0000 [IU] | Freq: Once | INTRAVENOUS | Status: AC | PRN
  Administered 2011-12-23: 500 [IU] via INTRAVENOUS
  Filled 2011-12-23: qty 5

## 2011-12-23 NOTE — Patient Instructions (Signed)
Call MD for problems

## 2012-01-02 ENCOUNTER — Encounter: Payer: Self-pay | Admitting: Genetic Counselor

## 2012-01-02 ENCOUNTER — Other Ambulatory Visit: Payer: Managed Care, Other (non HMO) | Admitting: Lab

## 2012-01-02 ENCOUNTER — Ambulatory Visit: Payer: Medicare Other | Admitting: Genetic Counselor

## 2012-01-02 DIAGNOSIS — C569 Malignant neoplasm of unspecified ovary: Secondary | ICD-10-CM

## 2012-01-02 NOTE — Progress Notes (Signed)
Dr.  Burney Gauze requested a consultation for genetic counseling and risk assessment for [name], a 51 y.o. female, for discussion of her personal history of probable ovarian cancer and family history of breast cancer. She presents to clinic today to discuss [name] possibility of a genetic predisposition to cancer, and to further clarify her risks, as well as her family members' risks for cancer.   HISTORY OF PRESENT ILLNESS: In 2009, at [name] age of 49, [name] was diagnosed with ovarian cancer. This was treated with surgery and chemotherapy.    Past Medical History  Diagnosis Date  . Hx pulmonary embolism   . Obesity   . Metrorrhagia   . Allergic rhinitis   . Iron deficiency anemia   . 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 Hypertension   . Alopecia   . Vitamin d deficiency   . HLD (hyperlipidemia)   . Adjustment disorder   . Anxiety and depression   . Squamous cell carcinoma of skin   . Raynaud's syndrome   . Right groin mass   . Ovarian cancer 26    Past Surgical History  Procedure Date  . Breast reduction surgery 2000  . Gastric bypass 1979  . Tonsillectomy   . Cesarean section     1991  . Abdominal hysterectomy 05/2008    TAH/BSO  . Colonoscopy 2004; 12/07/10    hemorrhoids    History  Substance Use Topics  . Smoking status: Current Everyday Smoker -- 1.0 packs/day for 37 years    Types: Cigarettes  . Smokeless tobacco: Never Used  . Alcohol Use: Yes     rare    REPRODUCTIVE HISTORY AND PERSONAL RISK ASSESSMENT FACTORS: Menarche was at age 89.   Menopausal Uterus Intact: No Ovaries Intact: No, as a result of cancer G2P1A1 , first live birth at age 8  She has not previously undergone treatment for infertility.   OCPs as a teen   She has not used HRT in [name] past.    FAMILY HISTORY:  We obtained a detailed, 4-generation family history.  Significant diagnoses are listed below: Family History  Problem Relation  Age of Onset  . Pulmonary embolism Mother   . Hypertension Father   . Breast cancer Maternal Grandmother     diagnosed in early 79s  . Hyperlipidemia    [name] was diagnosed with ovarian cancer at age 66.  Her mother died in her 13s from a pulmonary embolism, and maternal grandmother died in her early 75s from breast cancer.  Her father had 29 brothers and sisters.  One sister died of a brain cancer at an unknown age.  There is no other reported cancer history on either side of [name] family.  [name]'s maternal ancestors are of [place], Caucasian and [place] descent, and paternal ancestors are of [place], [place] and [place] descent. There is no reported Ashkenazi Jewish ancestry. There is no known consanguinity.  GENETIC COUNSELING RISK ASSESSMENT, DISCUSSION, AND SUGGESTED FOLLOW UP: We reviewed [name] natural history and genetic etiology of sporadic, familial and hereditary cancer syndromes.  [name] most common reason why an individual has a hereditary reason for ovarian cancer is because of a BRCA1 or BRCA2 mutation.  Additionally, about 5-10% of breast cancer is hereditary.  Of this, about 85% is [name] result of a BRCA1 or BRCA2 mutation.  Women under 1 with breast cancer who do not have a BRCA mutation, have about a  4% chance of testing positive for TP53. We reviewed [name] red flags of hereditary cancer syndromes and [name] dominant inheritance patterns.  If [name] BRCA testing is negative, we discussed that we could be testing for [name] wrong gene.  We discussed gene panels, and that several cancer genes that are associated with ovarian and breast cancer that can be tested at [name] same time.  We will consider [name] OvaNext panel.  If there is a large out of pocket cost, we will reflex back to [facility] testing.   [name]'s personal history of ovarian cancer and family history of breast cancer is suggestive of [name] following possible diagnosis: hereditary cancer syndrome  We  discussed that identification of a hereditary cancer syndrome may help her care providers tailor [name] patients medical management. If a mutation indicating hereditary cancer syndroem is detected in this case, [name] [employer]  recommendations would include increased cancer survillance and possible prophylactic surgery. If a mutation is detected, [name] will be referred back to [name] referring provider and to any additional appropriate care providers to discuss [name] relevant options.   If a mutation is not found in [name], this will decrease [name] likelihood of a hereditary cancer syndrome as [name] explanation for her ovarian cancer. Cancer surveillance options would be discussed for [name] according to [name] appropriate standard [place] guidelines, with consideration of their personal and family history risk factors. In this case, [name] will be referred back to their care providers for discussions of management.   After considering [name] risks, benefits, and limitations, [name] provided informed consent for  [name] following  testing: OvaNext through [facility].  Blood will be drawn next week as [name] forgot her insurance card.   Per [name]'s request, we will contact her by telephone to discuss these results. A follow up genetic counseling visit will be scheduled if indicated.  [name] was seen for a total of 60 minutes, greater than 50% of which was spent face-to-face counseling.  This plan is being carried out per Dr. Antonieta Pert recommendations.  This note will also be sent to [name] referring provider via [name] electronic medical record. [name] will be supplied with a summary of this genetic counseling discussion as well as educational information on [name] discussed hereditary cancer syndromes following [name] conclusion of their visit.   [name] was discussed with Dr. Marcy Panning.   _______________________________________________________________________ For Office Staff:  Number of people involved in session: 2 Was an Intern/ student involved with case: not applicable

## 2012-01-09 ENCOUNTER — Ambulatory Visit: Payer: Medicare Other

## 2012-01-27 ENCOUNTER — Ambulatory Visit: Payer: Managed Care, Other (non HMO)

## 2012-01-27 ENCOUNTER — Other Ambulatory Visit (HOSPITAL_BASED_OUTPATIENT_CLINIC_OR_DEPARTMENT_OTHER): Payer: Medicare Other | Admitting: Lab

## 2012-01-27 ENCOUNTER — Ambulatory Visit (HOSPITAL_BASED_OUTPATIENT_CLINIC_OR_DEPARTMENT_OTHER): Payer: Managed Care, Other (non HMO) | Admitting: Medical

## 2012-01-27 VITALS — BP 137/91 | HR 84 | Temp 98.1°F | Resp 18 | Ht 69.0 in | Wt 231.0 lb

## 2012-01-27 DIAGNOSIS — G609 Hereditary and idiopathic neuropathy, unspecified: Secondary | ICD-10-CM

## 2012-01-27 DIAGNOSIS — D649 Anemia, unspecified: Secondary | ICD-10-CM

## 2012-01-27 DIAGNOSIS — Z86711 Personal history of pulmonary embolism: Secondary | ICD-10-CM

## 2012-01-27 DIAGNOSIS — C801 Malignant (primary) neoplasm, unspecified: Secondary | ICD-10-CM

## 2012-01-27 DIAGNOSIS — R635 Abnormal weight gain: Secondary | ICD-10-CM

## 2012-01-27 DIAGNOSIS — G4700 Insomnia, unspecified: Secondary | ICD-10-CM

## 2012-01-27 DIAGNOSIS — M810 Age-related osteoporosis without current pathological fracture: Secondary | ICD-10-CM

## 2012-01-27 LAB — CBC WITH DIFFERENTIAL (CANCER CENTER ONLY)
EOS%: 1.3 % (ref 0.0–7.0)
Eosinophils Absolute: 0.1 10*3/uL (ref 0.0–0.5)
MCH: 29.6 pg (ref 26.0–34.0)
MONO%: 7.5 % (ref 0.0–13.0)
NEUT#: 3.7 10*3/uL (ref 1.5–6.5)
Platelets: 208 10*3/uL (ref 145–400)
RBC: 4.69 10*6/uL (ref 3.70–5.32)

## 2012-01-27 MED ORDER — ALTEPLASE 2 MG IJ SOLR
2.0000 mg | Freq: Once | INTRAMUSCULAR | Status: DC | PRN
  Filled 2012-01-27: qty 2

## 2012-01-27 MED ORDER — SODIUM CHLORIDE 0.9 % IJ SOLN
10.0000 mL | INTRAMUSCULAR | Status: DC | PRN
  Administered 2012-01-27: 10 mL via INTRAVENOUS
  Filled 2012-01-27: qty 10

## 2012-01-27 MED ORDER — HEPARIN SOD (PORK) LOCK FLUSH 100 UNIT/ML IV SOLN
500.0000 [IU] | Freq: Once | INTRAVENOUS | Status: AC
  Administered 2012-01-27: 500 [IU] via INTRAVENOUS
  Filled 2012-01-27: qty 5

## 2012-01-27 MED ORDER — SODIUM CHLORIDE 0.9 % IJ SOLN
10.0000 mL | INTRAMUSCULAR | Status: DC | PRN
  Filled 2012-01-27: qty 10

## 2012-01-27 MED ORDER — HEPARIN SOD (PORK) LOCK FLUSH 100 UNIT/ML IV SOLN
500.0000 [IU] | Freq: Once | INTRAVENOUS | Status: DC | PRN
  Filled 2012-01-27: qty 5

## 2012-01-28 LAB — COMPREHENSIVE METABOLIC PANEL
ALT: 35 U/L (ref 0–35)
AST: 29 U/L (ref 0–37)
Albumin: 4.3 g/dL (ref 3.5–5.2)
Alkaline Phosphatase: 102 U/L (ref 39–117)
Glucose, Bld: 90 mg/dL (ref 70–99)
Potassium: 3.4 mEq/L — ABNORMAL LOW (ref 3.5–5.3)
Sodium: 137 mEq/L (ref 135–145)
Total Protein: 6.6 g/dL (ref 6.0–8.3)

## 2012-01-28 NOTE — Progress Notes (Signed)
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 #2 history of pulmonary embolism. #3 chemotherapy-induced neuropathy./Dementia.  Current therapy: Observation  Interim history: [name] presents today for an office followup visit.  She still continues to have a lot of emotional issues.  She was being followed by Dr. Ferdinand [name], however, reports, that she is currently not with Dr. Ferdinand [name] anymore.  She still continues to report.  She has neuropathy in her feet.  She remains on Lyrica.  She was recently seen by our genetic counselor [name] on 01/02/2012 at which time she did discussed genetic testing due to her personal history of ovarian cancer, and family history of breast cancer.  I do believe.  She decided to go ahead with the genetic testing.  She is in the process of scheduling a bilateral mammogram at the breast Center.  Unfortunately, it seems that her main problem right now, is all her emotional issues.  She denies any type of abdominal pain, abdominal bloating.  She denies any obvious, bleeding she reports, she has an excellent appetite.  She denies any nausea, vomiting, diarrhea, constipation, chest pain, shortness of breath.  She denies any cough, fevers, chills, or night sweats.  She denies any headaches, blurry vision, or any type of rashes.  She denies any problems with her breasts.   Review of Systems: Pt. Denies any changes in their vision, hearing, adenopathy, fevers, chills, nausea, vomiting, diarrhea, constipation, chest pain, shortness of breath, passing blood, passing out, blacking out,  any changes in skin, joints, neurologic or psychiatric except as noted.  Physical Exam: This is a 51 year old , well-developed, well-nourished, white female, in no obvious distress Vitals: Temperature 90.2 degrees.  Pulse 84, respirations 18, blood pressure 137/91, weight 231 pounds HEENT reveals a normocephalic, atraumatic skull, no  scleral icterus, no oral lesions  Neck is supple without any cervical or supraclavicular adenopathy.  Lungs are clear to auscultation bilaterally. There are no wheezes, rales or rhonci Cardiac is regular rate and rhythm with a normal S1 and S2. There are no murmurs, rubs, or bruits.  Abdomen is soft with good bowel sounds, there is no palpable mass. There is no palpable hepatosplenomegaly. There is no palpable fluid wave.  Musculoskeletal no tenderness of the spine, ribs, or hips.  Extremities there are no clubbing, cyanosis, or edema.  Skin no petechia, purpura or ecchymosis Neurologic is nonfocal.  Laboratory Data: White count 6.0, hemoglobin 13.9, hematocrit 40.6, platelets 208,000  Current Outpatient Prescriptions on File Prior to Visit  Medication Sig Dispense Refill  . donepezil (ARICEPT) 10 MG tablet Take 10 mg by mouth every morning.       . DULoxetine (CYMBALTA) 60 MG capsule Take 1 pilll a day  30 capsule  6  . LORazepam (ATIVAN) 0.5 MG tablet Take 0.5 mg by mouth every 8 (eight) hours as needed. Anxiety/sleep.      . potassium chloride (KLOR-CON 10) 10 MEQ tablet       . pregabalin (LYRICA) 150 MG capsule Take 1 capsule (150 mg total) by mouth 2 (two) times daily.  60 capsule  3  . simvastatin (ZOCOR) 40 MG tablet Take 1 tablet (40 mg total) by mouth daily.  30 tablet  1  . triamterene-hydrochlorothiazide (MAXZIDE-25) 37.5-25 MG per tablet Take 2 each (2 tablets total) by mouth daily.  60 tablet  1   No current facility-administered medications on file prior to visit.   Assessment/Plan: This is a pleasant, 51 year old, female, with  the following issues:  #1.  History of poorly differentiated carcinoma within the abdomen.  This is felt to be ovarian cancer by molecular profiling.  She did receive chemotherapy with carboplatin/Taxotere.  She has been in remission for close to 4 years now.  #2 neuropathy-this is mainly in her feet most likely related to chemotherapy.  She remains  on Lyrica.  #3 cognitive issues-not fully sure.  This is all related to chemotherapy.  She is on Aricept for this.  #4 emotional problems-this is being followed by psychiatry.  #5, genetic counseling-she has R. knee been seen by our geneticist, and is going through with the genetic testing.  #6.  Followup-we will follow back up with [name] in 2 months, but before then should there be questions or concerns.  This patient was seen for a total of 60 minutes, greater than 50% of which was spent face-to-face counseling.

## 2012-02-05 ENCOUNTER — Telehealth: Payer: Self-pay | Admitting: *Deleted

## 2012-02-05 NOTE — Telephone Encounter (Signed)
Called patient to let her know that her vitamin d levels are low and patient needs to take Vitamin D 2000u daily.  Patient understands and will go to pharmacy to pick up

## 2012-02-05 NOTE — Telephone Encounter (Signed)
Message copied by Rico Ala on Wed Feb 05, 2012 10:02 AM ------      Message from: [name]      Created: Tue Feb 04, 2012  8:24 PM       Call - Vit D is low.  How much is she taking??  pete

## 2012-02-06 ENCOUNTER — Other Ambulatory Visit: Payer: Self-pay | Admitting: *Deleted

## 2012-02-06 DIAGNOSIS — F4323 Adjustment disorder with mixed anxiety and depressed mood: Secondary | ICD-10-CM

## 2012-02-06 DIAGNOSIS — C801 Malignant (primary) neoplasm, unspecified: Secondary | ICD-10-CM

## 2012-02-06 DIAGNOSIS — G4700 Insomnia, unspecified: Secondary | ICD-10-CM

## 2012-02-06 MED ORDER — ORPHENADRINE CITRATE ER 100 MG PO TB12: 100.0000 mg | ORAL_TABLET | Freq: Two times a day (BID) | ORAL | Status: DC

## 2012-02-06 MED ORDER — TRIAMTERENE-HCTZ 37.5-25 MG PO TABS: 2.0000 | ORAL_TABLET | Freq: Every day | ORAL | Status: DC

## 2012-02-06 MED ORDER — ZOLPIDEM TARTRATE 10 MG PO TABS: 10.0000 mg | ORAL_TABLET | Freq: Every evening | ORAL | Status: DC | PRN

## 2012-02-06 NOTE — Telephone Encounter (Signed)
Received refill requests from [facility] for Maxzide, Ambien, and Norflex. These are chronic meds for the pt. Sent the the Maxzide & Norflex via e-prescribe then faxed Ambien to [phone].

## 2012-02-10 ENCOUNTER — Other Ambulatory Visit: Payer: Self-pay | Admitting: *Deleted

## 2012-02-10 DIAGNOSIS — C4490 Unspecified malignant neoplasm of skin, unspecified: Secondary | ICD-10-CM

## 2012-02-10 MED ORDER — DIAZEPAM 2 MG PO TABS: 2.0000 mg | ORAL_TABLET | Freq: Every evening | ORAL | Status: DC | PRN

## 2012-02-12 ENCOUNTER — Other Ambulatory Visit: Payer: Self-pay | Admitting: Internal Medicine

## 2012-02-12 MED ORDER — SIMVASTATIN 40 MG PO TABS: 40.0000 mg | ORAL_TABLET | Freq: Every day | ORAL | Status: DC

## 2012-02-19 ENCOUNTER — Other Ambulatory Visit (HOSPITAL_COMMUNITY): Payer: Medicare Other

## 2012-04-02 ENCOUNTER — Ambulatory Visit (HOSPITAL_BASED_OUTPATIENT_CLINIC_OR_DEPARTMENT_OTHER): Payer: Medicare Other | Admitting: Hematology & Oncology

## 2012-04-02 ENCOUNTER — Ambulatory Visit: Payer: Medicare Other

## 2012-04-02 ENCOUNTER — Other Ambulatory Visit (HOSPITAL_BASED_OUTPATIENT_CLINIC_OR_DEPARTMENT_OTHER): Payer: Medicare Other | Admitting: Lab

## 2012-04-02 VITALS — BP 147/98 | HR 92 | Temp 97.5°F | Resp 18 | Ht 69.0 in | Wt 237.0 lb

## 2012-04-02 DIAGNOSIS — Z86718 Personal history of other venous thrombosis and embolism: Secondary | ICD-10-CM

## 2012-04-02 DIAGNOSIS — R609 Edema, unspecified: Secondary | ICD-10-CM

## 2012-04-02 DIAGNOSIS — M810 Age-related osteoporosis without current pathological fracture: Secondary | ICD-10-CM

## 2012-04-02 DIAGNOSIS — C801 Malignant (primary) neoplasm, unspecified: Secondary | ICD-10-CM

## 2012-04-02 DIAGNOSIS — I10 Essential (primary) hypertension: Secondary | ICD-10-CM

## 2012-04-02 DIAGNOSIS — E785 Hyperlipidemia, unspecified: Secondary | ICD-10-CM

## 2012-04-02 DIAGNOSIS — G620 Drug-induced polyneuropathy: Secondary | ICD-10-CM

## 2012-04-02 LAB — CBC WITH DIFFERENTIAL (CANCER CENTER ONLY)
BASO#: 0 10*3/uL (ref 0.0–0.2)
BASO%: 0.2 % (ref 0.0–2.0)
HCT: 42.9 % (ref 34.8–46.6)
HGB: 14.5 g/dL (ref 11.6–15.9)
LYMPH#: 1.8 10*3/uL (ref 0.9–3.3)
MONO#: 0.4 10*3/uL (ref 0.1–0.9)
NEUT%: 61 % (ref 39.6–80.0)
RBC: 4.95 10*6/uL (ref 3.70–5.32)
RDW: 14.7 % (ref 11.1–15.7)
WBC: 5.9 10*3/uL (ref 3.9–10.0)

## 2012-04-02 MED ORDER — HEPARIN SOD (PORK) LOCK FLUSH 100 UNIT/ML IV SOLN
500.0000 [IU] | Freq: Once | INTRAVENOUS | Status: AC | PRN
  Administered 2012-04-02: 500 [IU] via INTRAVENOUS
  Filled 2012-04-02: qty 5

## 2012-04-02 MED ORDER — ZOLPIDEM TARTRATE 10 MG PO TABS: ORAL_TABLET | ORAL | Status: DC

## 2012-04-02 MED ORDER — SODIUM CHLORIDE 0.9 % IJ SOLN
10.0000 mL | INTRAMUSCULAR | Status: DC | PRN
  Administered 2012-04-02: 10 mL via INTRAVENOUS
  Filled 2012-04-02: qty 10

## 2012-04-02 MED ORDER — ERGOCALCIFEROL 1.25 MG (50000 UT) PO CAPS: 50000.0000 [IU] | ORAL_CAPSULE | ORAL | Status: DC

## 2012-04-02 MED ORDER — ALTEPLASE 2 MG IJ SOLR
2.0000 mg | Freq: Once | INTRAMUSCULAR | Status: DC | PRN
  Filled 2012-04-02: qty 2

## 2012-04-02 NOTE — Patient Instructions (Signed)

## 2012-04-02 NOTE — Progress Notes (Signed)
This office note has been dictated.

## 2012-04-03 ENCOUNTER — Telehealth: Payer: Self-pay | Admitting: Hematology & Oncology

## 2012-04-03 LAB — COMPREHENSIVE METABOLIC PANEL
ALT: 19 U/L (ref 0–35)
Albumin: 4.5 g/dL (ref 3.5–5.2)
CO2: 30 mEq/L (ref 19–32)
Chloride: 99 mEq/L (ref 96–112)
Potassium: 3.8 mEq/L (ref 3.5–5.3)
Sodium: 136 mEq/L (ref 135–145)
Total Bilirubin: 0.4 mg/dL (ref 0.3–1.2)
Total Protein: 7.3 g/dL (ref 6.0–8.3)

## 2012-04-03 NOTE — Progress Notes (Signed)
CC:   Monico Blitz. Rhona Raider, M.D. Standley Brooking. Panosh, MD  DIAGNOSES: 1. Poorly differentiated carcinoma of unknown etiology-remission. 2. Chemotherapy-induced neuropathy/dementia/cataracts. 3. History of a pulmonary embolus.  CURRENT THERAPY:  Observation.  INTERIM HISTORY:  [name] comes in for a followup.  She is doing okay. She still has multiple complaints.  The complaints are not much that we can really do much about.  She now has cataracts.  She says this is from the chemotherapy.  This certainly could be the case, although it would be highly unusual.  She has problems with arthralgias.  Her knee is giving her most of the problems.  I will put her on some vitamin D at 50,000 units a week.  I also think she probably needs to go see Dr. Rhona Raider of Orthopedic Surgery.  She says the Ambien at 20 mg helps her sleep quite well.  We will refill this.  She says she is due for a colonoscopy.  She is complaining of a lot of reflux.  I suspect that she probably needs an upper endoscopy.  Dr. Carlean Purl is supposed to be coordinating this according to Ms. Mace.  She has had no nausea or vomiting.  She weight continues to go up.  She has had no rashes.  She has had no bleeding, although she woke up this morning with blood on her pillow.  She is not sure where this came from.  PHYSICAL EXAMINATION:  General:  This is an obese black female in no obvious distress.  Vital signs:  97.5, pulse 92, respiratory rate 18, blood pressure 147/98.  Weight is 237.  Head and neck:  Normocephalic, atraumatic skull.  There are no ocular or oral lesions.  There are no palpable cervical or supraclavicular lymph nodes.  Lungs:  Clear bilaterally.  Cardiac:  Regular rate and rhythm with a normal S1 and S2. There are no murmurs, rubs, or bruits.  Abdomen:  Soft with good bowel sounds.  There is no palpable abdominal mass.  There is no fluid wave. There is no palpable hepatosplenomegaly.  She has well-healed  laparotomy scar.  Extremities:  Crepitus in her knees.  No swelling is noted in the lower legs.  Neurological:  No focal neurological deficits.  LABORATORY STUDIES:  White cell count is 5.9, hemoglobin 14.5, hematocrit 42.9, platelet count 218.  IMPRESSION:  Ms. [name] is a 51 year old African female who has been in remission for 4 years now.  She had a poorly differentiated carcinoma that was felt to be ovarian by molecular profiling.  She received carboplatin/Taxotere.  Unfortunately, chemo has caused long-term side effects for her.  Again, I am not sure how much we can really do regarding this.  She is trying her best.  It just is quite difficult for her.  We will make the referral to [facility].  I do want to see her back in a month.  Hopefully, she will be feeling a little bit better.    ______________________________ [name], M.D. PRE/MEDQ  D:  04/02/2012  T:  04/03/2012  Job:  [id number]

## 2012-04-03 NOTE — Telephone Encounter (Signed)
Pt aware of 12-20 appointment

## 2012-04-17 ENCOUNTER — Telehealth: Payer: Self-pay | Admitting: Genetic Counselor

## 2012-04-17 NOTE — Telephone Encounter (Signed)
Revealed the BRCA1 mutation found on her OvaNext panel testing.  She will come in on 12/11 at 3 PM

## 2012-04-22 ENCOUNTER — Ambulatory Visit (HOSPITAL_BASED_OUTPATIENT_CLINIC_OR_DEPARTMENT_OTHER): Payer: Medicare Other | Admitting: Genetic Counselor

## 2012-04-22 DIAGNOSIS — IMO0002 Reserved for concepts with insufficient information to code with codable children: Secondary | ICD-10-CM

## 2012-04-22 DIAGNOSIS — Z803 Family history of malignant neoplasm of breast: Secondary | ICD-10-CM

## 2012-04-22 DIAGNOSIS — Z1501 Genetic susceptibility to malignant neoplasm of breast: Secondary | ICD-10-CM

## 2012-04-22 DIAGNOSIS — C569 Malignant neoplasm of unspecified ovary: Secondary | ICD-10-CM

## 2012-04-22 NOTE — Progress Notes (Signed)
[name], a 51 y.o. female, was seen for discussion of her genetic test results that found a BRCA1 mutation. She presents to clinic today to discuss her genetic predisposition to cancer, and to further clarify her risks, as well as her family members' risks for cancer.   HISTORY OF PRESENT ILLNESS: In 2009, at the age of 43, [name] was diagnosed with ovarian cancer.    Past Medical History  Diagnosis Date  . Hx pulmonary embolism   . Obesity   . Metrorrhagia   . Allergic rhinitis   . Iron deficiency anemia   . 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 Hypertension   . Alopecia   . Vitamin d deficiency   . HLD (hyperlipidemia)   . Adjustment disorder   . Anxiety and depression   . Squamous cell carcinoma of skin   . Raynaud's syndrome   . Right groin mass   . Ovarian cancer 70    Past Surgical History  Procedure Date  . Breast reduction surgery 2000  . Gastric bypass 1979  . Tonsillectomy   . Cesarean section     1991  . Abdominal hysterectomy 05/2008    TAH/BSO  . Colonoscopy 2004; 12/07/10    hemorrhoids    History  Substance Use Topics  . Smoking status: Current Every Day Smoker -- 1.0 packs/day for 37 years    Types: Cigarettes  . Smokeless tobacco: Never Used  . Alcohol Use: Yes     Comment: rare   FAMILY HISTORY:  We obtained a detailed, 4-generation family history.  Significant diagnoses are listed below: Family History  Problem Relation Age of Onset  . Pulmonary embolism Mother   . Hypertension Father   . Breast cancer Maternal Grandmother     diagnosed in early 41s  . Hyperlipidemia    The patient was diagnosed with ovarian cancer at age 40. Her mother died in her 52s from a pulmonary embolism, and maternal grandmother died in her early 69s from breast cancer. Her father had 50 brothers and sisters. One sister died of a brain cancer at an unknown age. There is no other reported cancer history on either side  of the family.   Patient's maternal ancestors are of [place], Caucasian and [place] descent, and paternal ancestors are of [place], [place] and [place] descent. There is no reported Ashkenazi Jewish ancestry. There is no known consanguinity.  GENETIC COUNSELING RISK ASSESSMENT, DISCUSSION, AND SUGGESTED FOLLOW UP: We reviewed the natural history and risks for BRCA1.  BRCA1 is a high risk breast and ovarian cancer gene.  It is associated with up to an 87% risk for breast cancer and 40% risk for ovarian cancer.  We discussed the option of risk reducing mastectomy vs. High risk breast screening.  The patient has had an oophorectomy at age 59.  This reduces her risk for breast cancer by up to 68%, however it does not eliminate the risk.  She understands, but would like to start with high risk breast screening.  We discussed the risks to other family members, including a 50% risk of having inherited this gene for her son, and her maternal siblings.  She is concerned about her paternal half sister as that sister's mother also died of ovarian cancer.  I told the patient I would send her a letter summarizing the information about risk to her family members and so that she could provide a copy of it  to her relatives.  Patient was given copies of NCCN guidelines in regards to how to manage risk.  The patient was seen for a total of 60 minutes, greater than 50% of which was spent face-to-face counseling.  This plan is being carried out per Dr. Freddie Apley recommendations.  This note will also be sent to the referring provider via the electronic medical record. The patient will be supplied with a summary of this genetic counseling discussion as well as educational information on the discussed hereditary cancer syndromes following the conclusion of their visit.   Patient was discussed with Dr. Marcy Panning.   _______________________________________________________________________ For Office Staff:  Number of people involved in session: 2 Was an Intern/ student involved with case: no

## 2012-04-23 ENCOUNTER — Encounter: Payer: Self-pay | Admitting: Genetic Counselor

## 2012-04-24 ENCOUNTER — Telehealth: Payer: Self-pay | Admitting: Hematology & Oncology

## 2012-04-24 NOTE — Telephone Encounter (Signed)
Pt called wanting mammogram per Dr. Marin Olp  He wants to see her first. Pt aware

## 2012-04-30 ENCOUNTER — Ambulatory Visit (HOSPITAL_BASED_OUTPATIENT_CLINIC_OR_DEPARTMENT_OTHER): Payer: Medicare Other | Admitting: Hematology & Oncology

## 2012-04-30 ENCOUNTER — Telehealth: Payer: Self-pay | Admitting: Hematology & Oncology

## 2012-04-30 ENCOUNTER — Other Ambulatory Visit: Payer: Self-pay | Admitting: Lab

## 2012-04-30 ENCOUNTER — Ambulatory Visit: Payer: Self-pay

## 2012-04-30 VITALS — BP 127/87 | HR 90 | Temp 97.7°F | Resp 18 | Ht 69.0 in | Wt 241.0 lb

## 2012-04-30 DIAGNOSIS — Z1239 Encounter for other screening for malignant neoplasm of breast: Secondary | ICD-10-CM

## 2012-04-30 DIAGNOSIS — G620 Drug-induced polyneuropathy: Secondary | ICD-10-CM

## 2012-04-30 DIAGNOSIS — Z1501 Genetic susceptibility to malignant neoplasm of breast: Secondary | ICD-10-CM

## 2012-04-30 DIAGNOSIS — F1997 Other psychoactive substance use, unspecified with psychoactive substance-induced persisting dementia: Secondary | ICD-10-CM

## 2012-04-30 DIAGNOSIS — C801 Malignant (primary) neoplasm, unspecified: Secondary | ICD-10-CM

## 2012-04-30 MED ORDER — HEPARIN SOD (PORK) LOCK FLUSH 100 UNIT/ML IV SOLN
500.0000 [IU] | Freq: Once | INTRAVENOUS | Status: AC | PRN
  Administered 2012-04-30: 500 [IU] via INTRAVENOUS
  Filled 2012-04-30: qty 5

## 2012-04-30 MED ORDER — SODIUM CHLORIDE 0.9 % IJ SOLN
10.0000 mL | INTRAMUSCULAR | Status: DC | PRN
  Administered 2012-04-30: 10 mL via INTRAVENOUS
  Filled 2012-04-30: qty 10

## 2012-04-30 MED ORDER — EXEMESTANE 25 MG PO TABS: 25.0000 mg | ORAL_TABLET | Freq: Every day | ORAL | Status: DC

## 2012-04-30 NOTE — Progress Notes (Signed)
This office note has been dictated.

## 2012-04-30 NOTE — Addendum Note (Signed)
Addended by: Burney Gauze R on: 04/30/2012 02:41 PM   Modules accepted: Orders

## 2012-04-30 NOTE — Telephone Encounter (Signed)
[place] scheduler at [facility] is aware pt needs MRI and will call patient to schedule it.

## 2012-04-30 NOTE — Patient Instructions (Signed)

## 2012-05-01 ENCOUNTER — Other Ambulatory Visit: Payer: Self-pay | Admitting: Lab

## 2012-05-01 ENCOUNTER — Ambulatory Visit: Payer: Self-pay | Admitting: Hematology & Oncology

## 2012-05-01 NOTE — Progress Notes (Signed)
CC:   [name]. Panosh, MD  DIAGNOSES: 1. BRCA1 positive. 2. Poorly differentiated carcinoma, clinical remission. 3. Chemotherapy induced neuropathy/dementia.  CURRENT THERAPY:  Observation.  INTERIM HISTORY:  [name] comes in for a visit.  She was tested for BRCA mutation.  Shockingly enough, she was found to have a positive BRCA1.  She, herself, had this poorly differentiated cancer that was taken out via hysterectomy and oophorectomy about 5 years ago. Molecular profiling seemed to suggest ovarian cancer.  In fact she had a maternal grandmother who passed away in her early 54s from breast cancer.  She had a sister who died of brain cancer.  Again, she is positive for the BRCA1 mutation.  She does have a son. She wants to make sure that he is going to be okay.  She did have bilateral breast reductions back 13 years ago.  She has implants.  As such, I think she probably helped herself quite a bit by getting these reductions.  She is not all that diligent with getting her mammograms.  I think she is probably going to need to have breast MRI.  We will go ahead and get this arranged for her.  I also think that it probably would not be a bad idea to consider putting her on Aromasin.  She is postmenopausal given that she has had a hysterectomy and oophorectomy.  There has been no problem with fever.  She has had no rashes.  She is somewhat disabled because of neuropathy and dementia.  She definitely has issues with her memory because of chemotherapy.  PHYSICAL EXAMINATION:  General:  This is a well-developed, well- nourished black female in no obvious distress.  Vital signs:  97.9, pulse 90, respiratory rate 18, blood pressure 127/87.  Weight is 241. Head and neck:  Shows a normocephalic, atraumatic skull.  There are no ocular or oral lesions.  There are no palpable cervical or supraclavicular lymph nodes.  Lungs:  Clear bilaterally.  Cardiac: Regular rate and rhythm with  a normal S1, S2.  There are no murmurs, rubs or bruits.  Breasts:  Show bilateral breast reductions.  She has implants.  No obvious masses are noted in the breasts bilaterally. There is no bilateral axillary adenopathy.  Abdomen:  Shows well-healed laparotomy scar.  There is no fluid wave.  She is somewhat obese.  There is no palpable hepatosplenomegaly.  Back:  Shows no tenderness over the spine, ribs or hips.  Extremities:  Show no clubbing, cyanosis or edema. Skin:  Shows no rashes, ecchymoses or petechiae.  LABORATORY STUDIES:  Labs were not done this visit.  IMPRESSION:  [name] is a 51 year old African American female. She actually is of mixed ancestry.  She does have some Caucasian ancestors, I think on her mother's side.  Our goal now is to make sure that she does not get breast cancer. Again, she did have bilateral breast reductions.  This definitely has helped her out quite a bit.  She is still at risk, however.  I think an MRI would be helpful for her breasts.  Again, I also think Aromasin would not be a bad idea for her.  I do not see any obvious contraindication for her to be on Aromasin.  As always, with [name] it is never "simple".  She really has done a good job trying to manage all of her issues. Again, she is disabled because of this neuropathy and dementia that she got from chemotherapy.  I will plan  to see her back in about 6 weeks' time.  I suppose that she always could consider bilateral mastectomies if she was very worried about developing breast cancer.    ______________________________ [name], M.D. PRE/MEDQ  D:  04/30/2012  T:  05/01/2012  Job:  [id number]

## 2012-05-07 ENCOUNTER — Other Ambulatory Visit: Payer: Self-pay | Admitting: *Deleted

## 2012-05-07 DIAGNOSIS — G629 Polyneuropathy, unspecified: Secondary | ICD-10-CM

## 2012-05-07 MED ORDER — PREGABALIN 150 MG PO CAPS: 150.0000 mg | ORAL_CAPSULE | Freq: Two times a day (BID) | ORAL | Status: DC

## 2012-05-08 ENCOUNTER — Other Ambulatory Visit: Payer: Self-pay | Admitting: *Deleted

## 2012-05-08 DIAGNOSIS — Z1239 Encounter for other screening for malignant neoplasm of breast: Secondary | ICD-10-CM

## 2012-05-14 ENCOUNTER — Telehealth: Payer: Self-pay | Admitting: Hematology & Oncology

## 2012-05-14 NOTE — Telephone Encounter (Signed)
Received message from Juliann Pulse at the [facility] that Pt is aware of 06-08-12 MRI and 1-24 mammogram. Abran Richard is aware to pre cert.

## 2012-05-15 ENCOUNTER — Other Ambulatory Visit: Payer: Self-pay | Admitting: *Deleted

## 2012-05-15 DIAGNOSIS — C801 Malignant (primary) neoplasm, unspecified: Secondary | ICD-10-CM

## 2012-05-15 MED ORDER — ORPHENADRINE CITRATE ER 100 MG PO TB12: 100.0000 mg | ORAL_TABLET | Freq: Two times a day (BID) | ORAL | Status: DC

## 2012-05-15 NOTE — Telephone Encounter (Signed)
Received refill requests from [facility] in [place], [place] for Norflex. The pt has been on this medication for a while but due to her progressive dementia & other health issues will ask that her PCP or Ortho MD fill further rx. Sent via e-rx.

## 2012-06-05 ENCOUNTER — Other Ambulatory Visit: Payer: Self-pay | Admitting: Hematology & Oncology

## 2012-06-05 ENCOUNTER — Ambulatory Visit
Admission: RE | Admit: 2012-06-05 | Discharge: 2012-06-05 | Disposition: A | Discharge status: Home / Self Care | Payer: Medicare Other | Source: Ambulatory Visit | Attending: Hematology & Oncology | Admitting: Hematology & Oncology

## 2012-06-05 ENCOUNTER — Other Ambulatory Visit: Payer: Self-pay | Admitting: Internal Medicine

## 2012-06-05 DIAGNOSIS — Z1231 Encounter for screening mammogram for malignant neoplasm of breast: Secondary | ICD-10-CM

## 2012-06-08 ENCOUNTER — Other Ambulatory Visit: Payer: Self-pay

## 2012-06-09 ENCOUNTER — Other Ambulatory Visit: Payer: Self-pay | Admitting: Family Medicine

## 2012-06-09 MED ORDER — POTASSIUM CHLORIDE ER 10 MEQ PO TBCR: 10.0000 meq | EXTENDED_RELEASE_TABLET | Freq: Every morning | ORAL | Status: DC

## 2012-06-09 NOTE — Telephone Encounter (Signed)
Patient is requesting refills at [facility] on [address].  She was last seen for this problem on 07/19/11.  On that day your changed the sig to 2 tabs daily.  Fax from [facility] says 1 tab daily.  Also, she has not had a BMP since 07/19/11.  Potassium was low on that day at 3.3.  What would you like to do?  Please advise.

## 2012-06-09 NOTE — Telephone Encounter (Signed)
Ok to refill x 1 month    OV Before runs out of med OV  . She had a normal potassium  with oncologyt in the fall.

## 2012-06-09 NOTE — Telephone Encounter (Signed)
Sent to the pharmacy by e-scribe. 

## 2012-06-10 ENCOUNTER — Other Ambulatory Visit: Payer: Self-pay | Admitting: Hematology & Oncology

## 2012-06-10 DIAGNOSIS — R928 Other abnormal and inconclusive findings on diagnostic imaging of breast: Secondary | ICD-10-CM

## 2012-06-11 ENCOUNTER — Other Ambulatory Visit: Payer: Self-pay | Admitting: Lab

## 2012-06-11 ENCOUNTER — Encounter: Payer: Self-pay | Admitting: Hematology & Oncology

## 2012-06-12 ENCOUNTER — Telehealth: Payer: Self-pay | Admitting: Hematology & Oncology

## 2012-06-12 NOTE — Telephone Encounter (Signed)
Pt aware of 07-10-12 MD appointment

## 2012-06-15 ENCOUNTER — Ambulatory Visit
Admission: RE | Admit: 2012-06-15 | Discharge: 2012-06-15 | Disposition: A | Discharge status: Home / Self Care | Payer: Medicare Other | Source: Ambulatory Visit | Attending: Hematology & Oncology | Admitting: Hematology & Oncology

## 2012-06-15 DIAGNOSIS — Z1239 Encounter for other screening for malignant neoplasm of breast: Secondary | ICD-10-CM

## 2012-06-15 MED ORDER — GADOBENATE DIMEGLUMINE 529 MG/ML IV SOLN
20.0000 mL | Freq: Once | INTRAVENOUS | Status: AC | PRN
  Administered 2012-06-15: 20 mL via INTRAVENOUS

## 2012-06-17 ENCOUNTER — Telehealth: Payer: Self-pay | Admitting: Internal Medicine

## 2012-06-17 NOTE — Telephone Encounter (Signed)
Per Dr. Regis Bill patient should be seen by ophthalmologist for eye concerns and patient understands decision. She has scheduled an appointment with Dr. Regis Bill for 06-23-12 for ankle swelling

## 2012-06-17 NOTE — Telephone Encounter (Signed)
Patient Information:  Caller Name: Promise  Phone: [phone]  Patient: [name], [name]  Gender: Female  DOB: 1961/03/15  Age: 52 Years  PCP: Shanon Ace (Family Practice)  Pregnant: No  Office Follow Up:  Does the office need to follow up with this patient?: Yes  Instructions For The Office: OFFICE CAN PT BE SEEN AT [facility] NEED TO GO TO ED (PT DID NOT WANT TO GO BECAUSE SHE HAS HAD THE CHANGE IN HER VISION  FOR 2 DAYS)  RN Note:  RN triaged patient using [facility] Guideline Eye:  Pain or Vision Change Protocol.  See ED Immediately Disposition for 'Sudden loss or change in vision'.  Symptoms  Reason For Call & Symptoms: caller reports sudden change in vision - blurry or hazey in Left Eye.  Reviewed Health History In EMR: Yes  Reviewed Medications In EMR: Yes  Reviewed Allergies In EMR: Yes  Reviewed Surgeries / Procedures: Yes  Date of Onset of Symptoms: 06/15/2012 OB / GYN:  LMP: Unknown  Guideline(s) Used:  No Protocol Available - Information Only  Disposition Per Guideline:   Discuss with PCP and Callback by Nurse within 1 Hour  Reason For Disposition Reached:   Nursing judgment  Advice Given:  Call Back If:  You become worse.

## 2012-06-22 ENCOUNTER — Ambulatory Visit
Admission: RE | Admit: 2012-06-22 | Discharge: 2012-06-22 | Disposition: A | Discharge status: Home / Self Care | Payer: Medicare Other | Source: Ambulatory Visit | Attending: Hematology & Oncology | Admitting: Hematology & Oncology

## 2012-06-22 ENCOUNTER — Other Ambulatory Visit: Payer: Self-pay | Admitting: Hematology & Oncology

## 2012-06-22 ENCOUNTER — Telehealth: Payer: Self-pay | Admitting: *Deleted

## 2012-06-22 DIAGNOSIS — R928 Other abnormal and inconclusive findings on diagnostic imaging of breast: Secondary | ICD-10-CM

## 2012-06-22 NOTE — Telephone Encounter (Signed)
Message copied by Rico Ala on Mon Jun 22, 2012  3:36 PM ------      Message from: [name]      Created: Sun Jun 21, 2012  5:39 PM       Call - no suspicious findings!!  Laurey Arrow ------

## 2012-06-22 NOTE — Telephone Encounter (Signed)
Called patient to let her know that there were no suspicious findings on her scan per dr. Marin Olp

## 2012-06-23 ENCOUNTER — Ambulatory Visit (INDEPENDENT_AMBULATORY_CARE_PROVIDER_SITE_OTHER): Payer: Medicare Other | Admitting: Internal Medicine

## 2012-06-23 ENCOUNTER — Other Ambulatory Visit: Payer: Self-pay | Admitting: Family Medicine

## 2012-06-23 ENCOUNTER — Encounter: Payer: Self-pay | Admitting: Internal Medicine

## 2012-06-23 VITALS — BP 126/96 | HR 103 | Temp 99.3°F | Wt 244.0 lb

## 2012-06-23 DIAGNOSIS — H5710 Ocular pain, unspecified eye: Secondary | ICD-10-CM | POA: Insufficient documentation

## 2012-06-23 DIAGNOSIS — H04129 Dry eye syndrome of unspecified lacrimal gland: Secondary | ICD-10-CM

## 2012-06-23 DIAGNOSIS — H04123 Dry eye syndrome of bilateral lacrimal glands: Secondary | ICD-10-CM

## 2012-06-23 DIAGNOSIS — G622 Polyneuropathy due to other toxic agents: Secondary | ICD-10-CM

## 2012-06-23 DIAGNOSIS — R2689 Other abnormalities of gait and mobility: Secondary | ICD-10-CM | POA: Insufficient documentation

## 2012-06-23 DIAGNOSIS — Z15060 Genetic susceptibility to colorectal cancer: Secondary | ICD-10-CM | POA: Insufficient documentation

## 2012-06-23 DIAGNOSIS — H5713 Ocular pain, bilateral: Secondary | ICD-10-CM

## 2012-06-23 DIAGNOSIS — Z8669 Personal history of other diseases of the nervous system and sense organs: Secondary | ICD-10-CM

## 2012-06-23 DIAGNOSIS — C801 Malignant (primary) neoplasm, unspecified: Secondary | ICD-10-CM

## 2012-06-23 DIAGNOSIS — Z1501 Genetic susceptibility to malignant neoplasm of breast: Secondary | ICD-10-CM

## 2012-06-23 DIAGNOSIS — R269 Unspecified abnormalities of gait and mobility: Secondary | ICD-10-CM

## 2012-06-23 DIAGNOSIS — Z1502 Genetic susceptibility to malignant neoplasm of ovary: Secondary | ICD-10-CM

## 2012-06-23 NOTE — Progress Notes (Signed)
Chief Complaint  Patient presents with  . Dizziness    Fell this morning when she got up out of bed.  Dizziness started this morning.  Ankle is doing better.  Still having some discoloration.  Says this has been ongoing since a child.  Also has some swelling.  Has red and itchy eyes.  Had some vision problems.  Has been using Refresh eye drops.  . Rash    HPI:  Patient comes in today for a number of problems. He originally was a rash on her lower extremity with some swelling in her left ankle which actually got better after a local skin treatment.  Swelling left more than right and skin issues ?  Salt scrub and helped her    See phone note when she called about blurred vision and was recommended to see an eye doctor or or ED she didn't go because the appointment time situation can be seen right away since that time she denies diplopia but says it's dry eyes eyes hurt and feel dry no other vision change feels irritated she doesn't wear contacts wants [place] to refer to a different eye doctor. Appointment      .   Has to go out of town next week.  New problem this morning when she got up she veered to the right and felt that she's off balance but no associated motor weakness new vision changes headache.  No trauma or hitting head or injury . She states it feels like being on a balance board when you're standing. Feels ok when sitting still .  No spinning no falling no new numbness or weakness. No speech changes. She denies any change in her medication but a generic change.  Pending.     Had diagnostic mammo gram. MRI done yesterday was okay she has a history of BRCA gene She states she is not taking Valium or Ativan. ROS: See pertinent positives and negatives per HPI. No bleeding falling or HA  Past Medical History  Diagnosis Date  . Hx pulmonary embolism   . Obesity   . Metrorrhagia   . Allergic rhinitis   . Iron deficiency anemia   . 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 Hypertension   . Alopecia   . Vitamin D deficiency   . HLD (hyperlipidemia)   . Adjustment disorder   . Anxiety and depression   . Squamous cell carcinoma of skin   . Raynaud's syndrome   . Right groin mass   . Ovarian cancer 6    Family History  Problem Relation Age of Onset  . Pulmonary embolism Mother   . Hypertension Father   . Breast cancer Maternal Grandmother     diagnosed in early 33s  . Hyperlipidemia      History   Social History  . Marital Status: Divorced    Spouse Name: N/A    Number of Children: 1  . Years of Education: N/A   Occupational History  .     Social History Main Topics  . Smoking status: Current Every Day Smoker -- 1.00 packs/day for 37 years    Types: Cigarettes  . Smokeless tobacco: Never Used  . Alcohol Use: Yes     Comment: rare  . Drug Use: No  . Sexually Active: None   Other Topics Concern  . None   Social History Narrative   Divorced   1 son /1 adopted adult son   Press photographer  occupation on disability for now   Regular exercise-yes money stress   Daily caffeine use 2-3    Outpatient Encounter Prescriptions as of 06/23/2012  Medication Sig Dispense Refill  . donepezil (ARICEPT) 10 MG tablet Take 10 mg by mouth every morning.       . DULoxetine (CYMBALTA) 60 MG capsule Take 60 mg by mouth daily.      . ergocalciferol (VITAMIN D2) 50000 UNITS capsule Take 1 capsule (50,000 Units total) by mouth once a week.  12 capsule  3  . Fish Oil-Cholecalciferol (FISH OIL + D3 PO) Take by mouth every morning.      . Glucosamine-Chondroitin (GLUCOSAMINE CHONDROITIN COMPLX) 500-250 MG CAPS Take by mouth every morning.      Marland Kitchen KLOR-CON 10 10 MEQ tablet TAKE 1 TABLET BY MOUTH DAILY  30 tablet  0  . Multiple Vitamins-Minerals (MULTIVITAL PO) Take by mouth every morning.      . orphenadrine (NORFLEX) 100 MG tablet TAKE 1 TABLET BY MOUTH TWICE DAILY  60 tablet  0  . potassium chloride (KLOR-CON 10) 10 MEQ tablet Take 1 tablet (10 mEq total) by  mouth every morning.  30 tablet  0  . pregabalin (LYRICA) 150 MG capsule Take 1 capsule (150 mg total) by mouth 2 (two) times daily.  60 capsule  3  . simvastatin (ZOCOR) 40 MG tablet Take 40 mg by mouth daily.      Marland Kitchen triamterene-hydrochlorothiazide (MAXZIDE-25) 37.5-25 MG per tablet Take 2 tablets by mouth daily.      Marland Kitchen triamterene-hydrochlorothiazide (MAXZIDE-25) 37.5-25 MG per tablet TAKE 2 TABLETS BY MOUTH DAILY  60 tablet  0  . zolpidem (AMBIEN) 10 MG tablet Take 2 pills at night for sleep.  60 tablet  2  . diazepam (VALIUM) 2 MG tablet Take 2 mg by mouth at bedtime as needed.      Marland Kitchen exemestane (AROMASIN) 25 MG tablet Take 1 tablet (25 mg total) by mouth daily after breakfast.  30 tablet  12  . LORazepam (ATIVAN) 0.5 MG tablet Take 0.5 mg by mouth every 8 (eight) hours.       . orphenadrine (NORFLEX) 100 MG tablet Take 1 tablet (100 mg total) by mouth 2 (two) times daily.  60 tablet  0   No facility-administered encounter medications on file as of 06/23/2012.    EXAM:  BP 126/96  Pulse 103  Temp(Src) 99.3 F (37.4 C) (Oral)  Wt 244 lb (110.678 kg)  BMI 36.02 kg/m2  SpO2 95%  Body mass index is 36.02 kg/(m^2).  GENERAL: vitals reviewed and listed above, alert, oriented, appears well hydrated and in no acute distress  HEENT: Normocephalic ;atraumatic , Eyes;  PERRL, EOMs  Full, lids and conjunctiva clear slight redness but no ciliary flush no photophobia and EOMs appear to be normal. She does have on I'm makeup. Lites,Ears: no deformities, canals nl, TM landmarks normal, Nose: no deformity or discharge  Mouth : OP clear without lesion or edema .  NECK: no obvious masses on inspection palpation   LUNGS: clear to auscultation bilaterally, no wheezes, rales or rhonchi, good air movement  CV: HRRR, no clubbing cyanosis or  peripheral edema nl cap refill  leg showing no significant edema although thirst weight swelling over the medial left ankle. No redness or warmth  MS: moves all  extremities without noticeable focal  abnormality gait is a bit unsteady but nonfocal she can stand with eyes closed with pretty good balance no tremor is noted. She  attempts to move and walk she states that's when she feels imbalance. However she could return and pick up a code and put it on without looking in stable.  PSYCH: pleasant and cooperative, no obvious depression or anxiety Lab Results  Component Value Date   WBC 5.9 04/02/2012   HGB 14.5 04/02/2012   HCT 42.9 04/02/2012   PLT 218 04/02/2012   GLUCOSE 79 04/02/2012   CHOL 157 08/21/2010   TRIG 132.0 08/21/2010   HDL 42.90 08/21/2010   LDLDIRECT 98.7 09/04/2009   LDLCALC 88 08/21/2010   ALT 19 04/02/2012   AST 20 04/02/2012   NA 136 04/02/2012   K 3.8 04/02/2012   CL 99 04/02/2012   CREATININE 0.89 04/02/2012   BUN 23 04/02/2012   CO2 30 04/02/2012   TSH 2.466 01/27/2012   INR 3.1 11/28/2010    ASSESSMENT AND PLAN:  Discussed the following assessment and plan:  1. Eye pain, bilateral  Ambulatory referral to Ophthalmology   Ambulatory referral to Ophthalmology  2. Imbalance    3. Dry eye, bilateral  Ambulatory referral to Ophthalmology   Ambulatory referral to Ophthalmology  4. History of blurry vision  Ambulatory referral to Ophthalmology   Ambulatory referral to Ophthalmology  5. BRCA gene positive    6. 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 NEOPLASM, MALIGNANT, UNKNOWN PRIMARY     No simple explanation for her multiple complaints she has today. I suspect she really needs to see the ophthalmologist in regard to her eye symptoms. Cannot explain the imbalance feeling she has today to have concerns with her baseline underlying condition history of cancer and polyneuropathy. We discussed referral evaluation however because the symptoms are mild and less than 24 hours old and no evidence of acute stroke event. We will watch this carefully and if persistent symptoms  do appropriate referral evaluation. He is possible  she is just developing a URI. She is on many medicines that could cause the symptom but there has been no acute change recently.  Patient asked for a prescription for shingles vaccine given to her today. She states she she has shingles twice like to get the vaccine even if her insurance doesn't pay. -Patient advised to return or notify health care team  if symptoms worsen or persist or new concerns arise.  Patient Instructions  Will work on arranging new eye appt   Concern about the imbalance since your exam shows nothing non focal .  On exam.     If sx persist contact us and we should get a neuro  Evaluation.     No infection obvious on exam today.    Ok to get shingles vaccine .    Standley Brooking. Bettymae Yott M.D.

## 2012-06-23 NOTE — Patient Instructions (Signed)
Will work on arranging new eye appt   Concern about the imbalance since your exam shows nothing non focal .  On exam.     If sx persist contact us and we should get a neuro  Evaluation.     No infection obvious on exam today.    Ok to get shingles vaccine .

## 2012-06-30 ENCOUNTER — Ambulatory Visit (INDEPENDENT_AMBULATORY_CARE_PROVIDER_SITE_OTHER): Payer: Medicare Other | Admitting: Internal Medicine

## 2012-06-30 ENCOUNTER — Encounter: Payer: Self-pay | Admitting: Internal Medicine

## 2012-06-30 ENCOUNTER — Telehealth: Payer: Self-pay | Admitting: Internal Medicine

## 2012-06-30 VITALS — BP 114/90 | HR 93 | Temp 97.3°F | Wt 243.0 lb

## 2012-06-30 DIAGNOSIS — B029 Zoster without complications: Secondary | ICD-10-CM

## 2012-06-30 DIAGNOSIS — R21 Rash and other nonspecific skin eruption: Secondary | ICD-10-CM

## 2012-06-30 MED ORDER — VALACYCLOVIR HCL 1 G PO TABS: 1000.0000 mg | ORAL_TABLET | Freq: Three times a day (TID) | ORAL | Status: DC

## 2012-06-30 NOTE — Patient Instructions (Signed)
Take antiviral as discussed.  Get other help .    If getting worse.

## 2012-06-30 NOTE — Telephone Encounter (Signed)
Patient Information:  Caller Name: Toiya  Phone: [phone]  Patient: [name], [name]  Gender: Female  DOB: 1960/05/31  Age: 52 Years  PCP: Shanon Ace (Family Practice)  Pregnant: No  Office Follow Up:  Does the office need to follow up with this patient?: Yes  Instructions For The Office: Would you verify that she needs to be seen in the office or can medication be called in since she has had same 2x before?  Thanks   Symptoms  Reason For Call & Symptoms: Is certain that she has shingles again on the left side of nose and inside of mouth.  The external area is the size of a quarter.  Has the rough texture to the area but no blisters yet.  She had shingles in the same area twice before within the last 3 years.  States that the area is very painful, even when talking.  Seems to be developing blisters in her mouth.  She is to fly to [facility] tomorrow 2/19.    Reviewed Health History In EMR: Yes  Reviewed Medications In EMR: Yes  Reviewed Allergies In EMR: Yes  Reviewed Surgeries / Procedures: Yes  Date of Onset of Symptoms: 06/30/2012 OB / GYN:  LMP: Unknown  Guideline(s) Used:  Rash or Redness - Localized  Disposition Per Guideline:   See Today in Office  Reason For Disposition Reached:   Painful rash and has multiple small blisters grouped together in one area of body (i.e., dermatomal distribution or "band" or "stripe")  Advice Given:  N/A  Appointment Scheduled:  06/30/2012 14:15:00 Appointment Scheduled Provider:  Shanon Ace (Family Practice)

## 2012-06-30 NOTE — Progress Notes (Signed)
Chief Complaint  Patient presents with  . Rash    Tingling started yesterday but the rash showed this morning.    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 Yesterday started tingling and then developed a rash that was itchy and painful. CAN got an appointment today is supposed to go out of town this week. She states it is red and irritated and she has sore in her mouth under her left lip. The area of redness is near the left part of her nose. ROS: See pertinent positives and negatives per HPI. No fever her swelling and other symptoms got better.  Past Medical History  Diagnosis Date  . Hx pulmonary embolism   . Obesity   . Metrorrhagia   . Allergic rhinitis   . Iron deficiency anemia   . 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 Hypertension   . Alopecia   . Vitamin D deficiency   . HLD (hyperlipidemia)   . Adjustment disorder   . Anxiety and depression   . Squamous cell carcinoma of skin   . Raynaud's syndrome   . Right groin mass   . Ovarian cancer 89    Family History  Problem Relation Age of Onset  . Pulmonary embolism Mother   . Hypertension Father   . Breast cancer Maternal Grandmother     diagnosed in early 46s  . Hyperlipidemia      History   Social History  . Marital Status: Divorced    Spouse Name: N/A    Number of Children: 1  . Years of Education: N/A   Occupational History  .     Social History Main Topics  . Smoking status: Current Every Day Smoker -- 1.00 packs/day for 37 years    Types: Cigarettes  . Smokeless tobacco: Never Used  . Alcohol Use: Yes     Comment: rare  . Drug Use: No  . Sexually Active: None   Other Topics Concern  . None   Social History Narrative   Divorced   1 son /1 adopted adult son   Sales occupation on disability for now   Regular exercise-yes money stress   Daily  caffeine use 2-3    Outpatient Encounter Prescriptions as of 06/30/2012  Medication Sig Dispense Refill  . diazepam (VALIUM) 2 MG tablet Take 2 mg by mouth at bedtime as needed.      . donepezil (ARICEPT) 10 MG tablet Take 10 mg by mouth every morning.       . DULoxetine (CYMBALTA) 60 MG capsule Take 60 mg by mouth daily.      . ergocalciferol (VITAMIN D2) 50000 UNITS capsule Take 1 capsule (50,000 Units total) by mouth once a week.  12 capsule  3  . exemestane (AROMASIN) 25 MG tablet Take 1 tablet (25 mg total) by mouth daily after breakfast.  30 tablet  12  . Fish Oil-Cholecalciferol (FISH OIL + D3 PO) Take by mouth every morning.      . Glucosamine-Chondroitin (GLUCOSAMINE CHONDROITIN COMPLX) 500-250 MG CAPS Take by mouth every morning.      Marland Kitchen KLOR-CON 10 10 MEQ tablet TAKE 1 TABLET BY MOUTH DAILY  30 tablet  0  . LORazepam (ATIVAN) 0.5 MG tablet Take 0.5 mg by mouth every 8 (eight) hours.       . Multiple  Vitamins-Minerals (MULTIVITAL PO) Take by mouth every morning.      . orphenadrine (NORFLEX) 100 MG tablet TAKE 1 TABLET BY MOUTH TWICE DAILY  60 tablet  0  . potassium chloride (KLOR-CON 10) 10 MEQ tablet Take 1 tablet (10 mEq total) by mouth every morning.  30 tablet  0  . pregabalin (LYRICA) 150 MG capsule Take 1 capsule (150 mg total) by mouth 2 (two) times daily.  60 capsule  3  . simvastatin (ZOCOR) 40 MG tablet Take 40 mg by mouth daily.      Marland Kitchen triamterene-hydrochlorothiazide (MAXZIDE-25) 37.5-25 MG per tablet Take 2 tablets by mouth daily.      Marland Kitchen triamterene-hydrochlorothiazide (MAXZIDE-25) 37.5-25 MG per tablet TAKE 2 TABLETS BY MOUTH DAILY  60 tablet  0  . zolpidem (AMBIEN) 10 MG tablet Take 2 pills at night for sleep.  60 tablet  2  . orphenadrine (NORFLEX) 100 MG tablet Take 1 tablet (100 mg total) by mouth 2 (two) times daily.  60 tablet  0  . valACYclovir (VALTREX) 1000 MG tablet Take 1 tablet (1,000 mg total) by mouth 3 (three) times daily.  21 tablet  0   No  facility-administered encounter medications on file as of 06/30/2012.    EXAM:  BP 114/90  Pulse 93  Temp(Src) 97.3 F (36.3 C) (Oral)  Wt 243 lb (110.224 kg)  BMI 35.87 kg/m2  SpO2 97%  Body mass index is 35.87 kg/(m^2).  GENERAL: vitals reviewed and listed above, alert, oriented, appears well hydrated and in no acute distress she has some obvious redness on the left cheek and near the left nostril.  HEENT: atraumatic, conjunctiva  clear, OP : no lesion edema or exudate there is some redness and swelling find little ulcers left upper lip. NECK: no obvious masses on inspection palpation  Skin left face left lower cheek near left nostril +1 redness patchy mild edema no fluctuance I don't really see a vesicle but it is blotchy only a left side of her face. No blisters. MS: moves all extremities without noticeable focal  abnormality  PSYCH: pleasant and cooperative, no obvious depression or anxiety  ASSESSMENT AND PLAN:  Discussed the following assessment and plan:  Facial rash  Herpes zoster infection of maxillary region versus other herpetic - HSV versus zoster versus other very early history of same iempiric rx advised Valtrex given  -Patient advised to return or notify health care team  if symptoms worsen or persist or new concerns arise.  Patient Instructions  Take antiviral as discussed.  Get other help .    If getting worse.    Standley Brooking. Panosh M.D.

## 2012-07-10 ENCOUNTER — Other Ambulatory Visit: Payer: Self-pay | Admitting: Lab

## 2012-07-10 ENCOUNTER — Ambulatory Visit: Payer: Self-pay | Admitting: Hematology & Oncology

## 2012-07-13 ENCOUNTER — Other Ambulatory Visit: Payer: Self-pay | Admitting: Hematology & Oncology

## 2012-07-16 ENCOUNTER — Telehealth: Payer: Self-pay | Admitting: Internal Medicine

## 2012-07-16 NOTE — Telephone Encounter (Signed)
Patient Information:  Caller Name: Lajuana  Phone: [phone]  Patient: [name] -. Briant Sites  Gender: Female  DOB: December 12, 1960  Age: 52 Years  PCP: Shanon Ace [facility])  Pregnant: No  Office Follow Up:  Does the office need to follow up with this patient?: No  Instructions For The Office: N/A   Symptoms  Reason For Call & Symptoms: Neuropathy pain worsened yesterday -07/15/12 and she had trouble sleeping last night. All of her joints hurt, especially her hands and Ankles. Ankles are red and swollen. Oncologist recommended that PCP take over some of her meds/care that are out of the scope Oncology(such as meds for HTN and cholesterol).  She feels tired all the time. Requesting Physical.  Reviewed Health History In EMR: Yes  Reviewed Medications In EMR: Yes  Reviewed Allergies In EMR: Yes  Reviewed Surgeries / Procedures: Yes  Date of Onset of Symptoms: 07/15/2012  Treatments Tried: Lyrica 150 mg QD, Orthenidrine  Treatments Tried Worked: No OB / GYN:  LMP: Unknown  Guideline(s) Used:  Leg Pain  Ankle Pain  Disposition Per Guideline:   See Within 3 Days in Office  Reason For Disposition Reached:   Moderate pain (e.g., interferes with normal activities, limping) and present > 3 days  Advice Given:  Reassurance:  The symptoms you describe do not sound serious. You have told me that there is no redness, swelling, or fever. You have also told me that there has been no recent major injury.  Ankle pain can be caused be mild arthritis and other minor problems.  Pain Medicines:  For pain relief, you can take either acetaminophen, ibuprofen, or naproxen.  They are over-the-counter (OTC) pain drugs. You can buy them at the drugstore.  Expected Course:   If this does not get better during the next week, you should make an appointment to see your doctor.  Call Back If:  Mild pain lasts over 7 days  You become worse.  Pain Medicines:  For pain relief, you can take either  acetaminophen, ibuprofen, or naproxen.  They are over-the-counter (OTC) pain drugs. You can buy them at the drugstore.  Call Back If:  Fever occurs  Redness or swelling occurs  Pain lasts over 7 days  You become worse.  Appointment Scheduled:  07/17/2012 13:45:00 Appointment Scheduled Provider:  Shanon Ace [facility])

## 2012-07-17 ENCOUNTER — Ambulatory Visit: Payer: Medicare Other | Admitting: Internal Medicine

## 2012-07-17 ENCOUNTER — Ambulatory Visit: Payer: Self-pay | Admitting: Internal Medicine

## 2012-07-23 ENCOUNTER — Other Ambulatory Visit: Payer: Self-pay | Admitting: *Deleted

## 2012-07-23 MED ORDER — DONEPEZIL HCL 10 MG PO TABS: 10.0000 mg | ORAL_TABLET | Freq: Every morning | ORAL | Status: DC

## 2012-08-06 ENCOUNTER — Ambulatory Visit (INDEPENDENT_AMBULATORY_CARE_PROVIDER_SITE_OTHER): Payer: Medicare Other | Admitting: Internal Medicine

## 2012-08-06 ENCOUNTER — Encounter: Payer: Self-pay | Admitting: Internal Medicine

## 2012-08-06 VITALS — BP 126/86 | HR 107 | Temp 98.3°F | Wt 240.0 lb

## 2012-08-06 DIAGNOSIS — Z9884 Bariatric surgery status: Secondary | ICD-10-CM

## 2012-08-06 DIAGNOSIS — I10 Essential (primary) hypertension: Secondary | ICD-10-CM

## 2012-08-06 DIAGNOSIS — K219 Gastro-esophageal reflux disease without esophagitis: Secondary | ICD-10-CM

## 2012-08-06 DIAGNOSIS — G479 Sleep disorder, unspecified: Secondary | ICD-10-CM

## 2012-08-06 DIAGNOSIS — G622 Polyneuropathy due to other toxic agents: Secondary | ICD-10-CM

## 2012-08-06 DIAGNOSIS — R29818 Other symptoms and signs involving the nervous system: Secondary | ICD-10-CM

## 2012-08-06 DIAGNOSIS — Z1501 Genetic susceptibility to malignant neoplasm of breast: Secondary | ICD-10-CM

## 2012-08-06 DIAGNOSIS — Z1502 Genetic susceptibility to malignant neoplasm of ovary: Secondary | ICD-10-CM

## 2012-08-06 DIAGNOSIS — F4323 Adjustment disorder with mixed anxiety and depressed mood: Secondary | ICD-10-CM

## 2012-08-06 DIAGNOSIS — C801 Malignant (primary) neoplasm, unspecified: Secondary | ICD-10-CM

## 2012-08-06 DIAGNOSIS — M810 Age-related osteoporosis without current pathological fracture: Secondary | ICD-10-CM

## 2012-08-06 MED ORDER — ZOLPIDEM TARTRATE 10 MG PO TABS: ORAL_TABLET | ORAL | Status: DC

## 2012-08-06 NOTE — Progress Notes (Signed)
Chief Complaint  Patient presents with  . Follow-up    Discussed transition of medications management and care    HPI: Patient comes in today for follow up of  multiple medical problems.   Appt delayed for snow and ice   Days . At that time she was havingHaving neruopathy flares at times  Then sleep  A problem   Dr Marin Olp and  Is out for a month   he had advised care to be  Wants to it to be handled in another office such as PCP. However he would still see her followup for the cancer..   He has been basically managing all her disease states over the last number of years and include medication for depression and sleep. She states that she is having difficult times with sleep and in the past and seeing a specialist 2 years ago. Nothing seemed to be helpful lap much has been using 20 mg of Ambien at night just to fall asleep because that is her problem as opposed to staying asleep otherwise she may not go to sleep until 6 AM. Things that also disturbed her sleep her pain at night for neuropathy pain in the day mostly musculoskeletal joint pain.   Wants some Lorrin Mais because she is running out. Dohmeir in the past did valuation then used many medicines as a trial number of years ago. She hasn't seen her in about 2 years.  Many meds didn't help that much   And  Off and on   Pain meds not onthese now  Sleep ow worse over the last 10 days.  Totally off. Valium and pain meds   Needs to plan  preventive   Visit   Has been on disability since cancer and now has a form from her job disability to be filled out.  Has ss security disability .  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  Fall alseep  Problem    Falling asleep   Taking 2 ambien and lots of tylenol pm.   counsleor dean peters in the past.  About 1 year ago.  Dr Lorrin Mais.   At one time for valium lorazepam and now is not really on dose except for an occasional Ativan if gets anxious.   She is on Cymbalta which might help some with her  musculoskeletal or a neuropathic pain.  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  Did discuss the possibility of resistant depression in the past but at the time she did not have money for co-pays to see someone in her network for medication consult psychiatry. She has good days and bad days and depression still remains but is not hopeless.   She states that her numbness from the neuropathy is midfoot down which does affect her balance because she has less proprioception also fingers on both hands doesn't use a regular keyboard difficult to tight because it cannot feel. She can grip and there is no motor weakness obvious. She's also had joint pain musculoskeletal pain ongoing since the chemotherapy has had knee problems that required joint injections through [place] and [place]. No other diagnosed arthritis.   She has the breast cancer gene and discussions are ongoing about whether to have a prophylactic mastectomy. She is on Aromasin but has run out to help reduce her risk at this time.   Dr E has been refilling in managing her hypertension meds etc.   ROS: See pertinent positives and  negatives per HPI. 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 Has had some heartburn worse when she lays down and she's gained some weight but no dysphasia had used never the counters stop working on nothing at this time.  Past Medical History  Diagnosis Date  . Hx pulmonary embolism   . Obesity   . Metrorrhagia   . Allergic rhinitis   . Iron deficiency anemia   . 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 Hypertension   . Alopecia   . Vitamin D deficiency   . HLD (hyperlipidemia)   .  Adjustment disorder   . Anxiety and depression   . Squamous cell carcinoma of skin   . Raynaud's syndrome   . Right groin mass   . Ovarian cancer 56    Family History  Problem Relation Age of Onset  . Pulmonary embolism Mother   . Hypertension Father   . Breast cancer Maternal Grandmother     diagnosed in early 79s  . Hyperlipidemia      History   Social History  . Marital Status: Divorced    Spouse Name: N/A    Number of Children: 1  . Years of Education: N/A   Occupational History  .     Social History Main Topics  . Smoking status: Current Every Day Smoker -- 1.00 packs/day for 37 years    Types: Cigarettes  . Smokeless tobacco: Never Used  . Alcohol Use: Yes     Comment: rare  . Drug Use: No  . Sexually Active: None   Other Topics Concern  . None   Social History Narrative   Divorced   1 son /1 adopted adult son   Sales occupation on disability for now   Regular exercise-yes money stress   Daily caffeine use 2-3   On since a security disability since 2012    Outpatient Encounter Prescriptions as of 08/06/2012  Medication Sig Dispense Refill  . donepezil (ARICEPT) 10 MG tablet Take 1 tablet (10 mg total) by mouth every morning.  30 tablet  0  . DULoxetine (CYMBALTA) 60 MG capsule Take 60 mg by mouth daily.      . ergocalciferol (VITAMIN D2) 50000 UNITS capsule Take 1 capsule (50,000 Units total) by mouth once a week.  12 capsule  3  . Fish Oil-Cholecalciferol (FISH OIL + D3 PO) Take by mouth every morning.      . Glucosamine-Chondroitin (GLUCOSAMINE CHONDROITIN COMPLX) 500-250 MG CAPS Take by mouth every morning.      Marland Kitchen LORazepam (ATIVAN) 0.5 MG tablet Take 0.5 mg by mouth every 8 (eight) hours.       . Multiple Vitamins-Minerals (MULTIVITAL PO) Take by mouth every morning.      . orphenadrine (NORFLEX) 100 MG tablet TAKE 1 TABLET BY MOUTH TWICE DAILY  60 tablet  0  . potassium chloride (KLOR-CON 10) 10 MEQ tablet Take 1 tablet (10 mEq total) by mouth  every morning.  30 tablet  0  . pregabalin (LYRICA) 150 MG capsule Take 1 capsule (150 mg total) by mouth 2 (two) times daily.  60 capsule  3  . simvastatin (ZOCOR) 40 MG tablet Take 40 mg by mouth daily.      Marland Kitchen  triamterene-hydrochlorothiazide (MAXZIDE-25) 37.5-25 MG per tablet TAKE 2 TABLETS BY MOUTH DAILY  60 tablet  0  . zolpidem (AMBIEN) 10 MG tablet Take 2 pills at night for sleep.  60 tablet  2  . [DISCONTINUED] KLOR-CON 10 10 MEQ tablet TAKE 1 TABLET BY MOUTH DAILY  30 tablet  0  . [DISCONTINUED] zolpidem (AMBIEN) 10 MG tablet Take 2 pills at night for sleep.  60 tablet  2  . exemestane (AROMASIN) 25 MG tablet Take 1 tablet (25 mg total) by mouth daily after breakfast.  30 tablet  12  . [DISCONTINUED] diazepam (VALIUM) 2 MG tablet Take 2 mg by mouth at bedtime as needed.      . [DISCONTINUED] orphenadrine (NORFLEX) 100 MG tablet Take 1 tablet (100 mg total) by mouth 2 (two) times daily.  60 tablet  0  . [DISCONTINUED] triamterene-hydrochlorothiazide (MAXZIDE-25) 37.5-25 MG per tablet Take 2 tablets by mouth daily.      . [DISCONTINUED] valACYclovir (VALTREX) 1000 MG tablet Take 1 tablet (1,000 mg total) by mouth 3 (three) times daily.  21 tablet  0   No facility-administered encounter medications on file as of 08/06/2012.    EXAM:  BP 126/86  Pulse 107  Temp(Src) 98.3 F (36.8 C) (Oral)  Wt 240 lb (108.863 kg)  BMI 35.43 kg/m2  SpO2 98%  Body mass index is 35.43 kg/(m^2).  GENERAL: vitals reviewed and listed above, alert, oriented, appears well hydrated and in no acute distress he is alert verbal somewhat hyperactive but appropriate no tremor  HEENT: atraumatic, conjunctiva  clear, no obvious abnormalities on inspection of external nose and ears OP : no lesion edema or exudate   NECK: no obvious masses on inspection palpation she has some tenderness along the right sternocleidomastoid area feels full but no discrete adenopathy or fullness there no supraclavicular nodes  LUNGS:  clear to auscultation bilaterally, no wheezes, rales or rhonchi, good air movement  CV: HRRR, no clubbing cyanosis or  peripheral edema nl cap refill  Abdomen soft without obvious or data megaly guarding rebound or masses. MS: moves all extremities without noticeable focal  abnormality has some calluses on the bottom of her foot no acute joint swelling. Neurologic cranial nerves III through XII appear intact no obvious motor deficits decreased sensation peripherally as above distal feet fingers. Calluses on the feet none on finger.  PSYCH: pleasant and cooperative,  talkative normal thought process and speech.  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  - Plan: Ambulatory referral to Neurology  GERD (gastroesophageal reflux disease) - Symptoms appeared to be related to weight gain discussed other measures over-the-counter medicine followup weight loss  Osteoporosis, post-menopausal - Plan: zolpidem (AMBIEN) 10 MG tablet  NEOPLASM, MALIGNANT, UNKNOWN PRIMARY Has had 2 subscribers  For pain and sleep and now asking pcp to do this and fill out  Neuro form   2010.   We'll gather information she has many medical conditions. I feel uncomfortable with her maintaining on 20 mg of Ambien at night especially with her neurocognitive problems however she's been on this for quite  a while and seems to be the minimalist intervention to help her fall asleep. For now we'll refill medication try to get help if treatment for her resistant depression although she's doing better than  she used to and has a good attitude.  In regard to neuropathy I do think she should have a followup neurology and sleep involved will refer to Dr. Rich Brave. Also because of her history of bypass surgery he may consider looking at vitamin levels at some time to look for nutritional deficits. After patient left reviewed medication list she may benefit from a trial off of the simvastatin in regard to her muscle and bone aches. May have her hold off on a rheumatology consult in 2 weeks try off of thesimvastatin Told her will do my best and filling out the form for disability. I feel she has significant amount of medical problems that she is a good candidate for disability and she also qualified for services security disability. Doesn't appear her neuropathy will get better nor her cognitive changes.  It is going to take quite a while and the number of visits to try to sort out the best plan going forward.  Dr. Marin Olp. continue following oncology related issues. Names of psychiatrist given to reexplore appt and med management  -Patient advised to return or notify health care team  if symptoms worsen or persist or new concerns arise.  Patient Instructions  Will arrange  Referral to neurology   About  [facility] info about neuropathy.   And rheumatology about   Joint sx  ms pain.   I don't feel comfortable  With high dose ambien for  extended time but will refill at this time  And see what kind of help we can get.   Consider resistant   Depression as possible bipolar.    And other meds may help better than what you are .  Will do the best Ican with the  disability form as I believe you should be approved for this for multiple reason.     Schedule  For  Preventive visit  For 1-2 months from now.       Try otc prilosec for about 2 weeks .  Diet for Gastroesophageal Reflux Disease, Adult Reflux (acid reflux) is when acid from your stomach flows up into the esophagus. When acid comes in contact with the  esophagus, the acid causes irritation and soreness (inflammation) in the esophagus. When reflux happens often or so severely that it causes damage to the esophagus, it is called gastroesophageal reflux disease (GERD). Nutrition therapy can help ease the discomfort of GERD. FOODS OR DRINKS TO AVOID OR LIMIT  Smoking or chewing tobacco. Nicotine is one of the most potent stimulants to acid production in the gastrointestinal tract.  Caffeinated and decaffeinated coffee and black tea.  Regular or low-calorie carbonated beverages or energy drinks (caffeine-free carbonated beverages are allowed).   Strong spices, such as black pepper, white pepper, red pepper, cayenne, curry powder, and chili powder.  Peppermint or spearmint.  Chocolate.  High-fat foods, including meats and fried foods. Extra added fats including oils, butter, salad dressings, and nuts. Limit these to less than 8 tsp per day.  Fruits and vegetables if they are not tolerated, such as citrus fruits or tomatoes.  Alcohol.  Any food that seems to aggravate your condition. If you have questions regarding your diet, call your caregiver or a registered dietitian. OTHER THINGS THAT MAY HELP GERD INCLUDE:   Eating your meals  slowly, in a relaxed setting.  Eating 5 to 6 small meals per day instead of 3 large meals.  Eliminating food for a period of time if it causes distress.  Not lying down until 3 hours after eating a meal.  Keeping the head of your bed raised 6 to 9 inches (15 to 23 cm) by using a foam wedge or blocks under the legs of the bed. Lying flat may make symptoms worse.  Being physically active. Weight loss may be helpful in reducing reflux in overweight or obese adults.  Wear loose fitting clothing EXAMPLE MEAL PLAN This meal plan is approximately 2,000 calories based on [email or web address] meal planning guidelines. Breakfast   cup cooked oatmeal.  1 cup strawberries.  1 cup low-fat milk.  1 oz  almonds. Snack  1 cup cucumber slices.  6 oz yogurt (made from low-fat or fat-free milk). Lunch  2 slice whole-wheat bread.  2 oz sliced [place].  2 tsp mayonnaise.  1 cup blueberries.  1 cup snap peas. Snack  6 whole-wheat crackers.  1 oz string cheese. Dinner   cup brown rice.  1 cup mixed veggies.  1 tsp olive oil.  3 oz grilled fish. Document Released: 04/29/2005 Document Revised: 07/22/2011 Document Reviewed: 03/15/2011 [facility] Patient Information 2013 [place].      Standley Brooking. Panosh M.D.  Total visit 29mns > 50% spent counseling and coordinating care

## 2012-08-06 NOTE — Patient Instructions (Addendum)
Will arrange  Referral to neurology   About  [facility] about neuropathy.   And rheumatology about   Joint sx  ms pain.   I don't feel comfortable  With high dose ambien for  extended time but will refill at this time  And see what kind of help we can get.   Consider resistant   Depression as possible bipolar.    And other meds may help better than what you are .  Will do the best Ican with the  disability form as I believe you should be approved for this for multiple reason.     Schedule  For  Preventive visit  For 1-2 months from now.       Try otc prilosec for about 2 weeks .  Diet for Gastroesophageal Reflux Disease, Adult Reflux (acid reflux) is when acid from your stomach flows up into the esophagus. When acid comes in contact with the esophagus, the acid causes irritation and soreness (inflammation) in the esophagus. When reflux happens often or so severely that it causes damage to the esophagus, it is called gastroesophageal reflux disease (GERD). Nutrition therapy can help ease the discomfort of GERD. FOODS OR DRINKS TO AVOID OR LIMIT  Smoking or chewing tobacco. Nicotine is one of the most potent stimulants to acid production in the gastrointestinal tract.  Caffeinated and decaffeinated coffee and black tea.  Regular or low-calorie carbonated beverages or energy drinks (caffeine-free carbonated beverages are allowed).   Strong spices, such as black pepper, white pepper, red pepper, cayenne, curry powder, and chili powder.  Peppermint or spearmint.  Chocolate.  High-fat foods, including meats and fried foods. Extra added fats including oils, butter, salad dressings, and nuts. Limit these to less than 8 tsp per day.  Fruits and vegetables if they are not tolerated, such as citrus fruits or tomatoes.  Alcohol.  Any food that seems to aggravate your condition. If you have questions regarding your diet, call your caregiver or a registered dietitian. OTHER THINGS THAT MAY  HELP GERD INCLUDE:   Eating your meals slowly, in a relaxed setting.  Eating 5 to 6 small meals per day instead of 3 large meals.  Eliminating food for a period of time if it causes distress.  Not lying down until 3 hours after eating a meal.  Keeping the head of your bed raised 6 to 9 inches (15 to 23 cm) by using a foam wedge or blocks under the legs of the bed. Lying flat may make symptoms worse.  Being physically active. Weight loss may be helpful in reducing reflux in overweight or obese adults.  Wear loose fitting clothing EXAMPLE MEAL PLAN This meal plan is approximately 2,000 calories based on [email or web address] meal planning guidelines. Breakfast   cup cooked oatmeal.  1 cup strawberries.  1 cup low-fat milk.  1 oz almonds. Snack  1 cup cucumber slices.  6 oz yogurt (made from low-fat or fat-free milk). Lunch  2 slice whole-wheat bread.  2 oz sliced [place].  2 tsp mayonnaise.  1 cup blueberries.  1 cup snap peas. Snack  6 whole-wheat crackers.  1 oz string cheese. Dinner   cup brown rice.  1 cup mixed veggies.  1 tsp olive oil.  3 oz grilled fish. Document Released: 04/29/2005 Document Revised: 07/22/2011 Document Reviewed: 03/15/2011 [facility] Patient Information 2013 [place].

## 2012-08-19 ENCOUNTER — Other Ambulatory Visit: Payer: Self-pay | Admitting: *Deleted

## 2012-08-19 ENCOUNTER — Other Ambulatory Visit: Payer: Self-pay | Admitting: Internal Medicine

## 2012-08-20 NOTE — Telephone Encounter (Signed)
Received a refill request from [facility] for Norflex and a call from the pt for Lyrica. Declined refill requests after reading Dr Velora Mediate notes as she has made specific recommendations and referrals for the pt and is currently handling her meds. Will defer refill requests to her.

## 2012-08-21 ENCOUNTER — Telehealth: Payer: Self-pay | Admitting: Internal Medicine

## 2012-08-21 DIAGNOSIS — G629 Polyneuropathy, unspecified: Secondary | ICD-10-CM

## 2012-08-21 MED ORDER — PREGABALIN 150 MG PO CAPS: 150.0000 mg | ORAL_CAPSULE | Freq: Two times a day (BID) | ORAL | Status: DC

## 2012-08-21 NOTE — Telephone Encounter (Signed)
Pt needs refill of pregabalin (LYRICA) 150 MG capsule [facility]/[address]  Pt is OUT and would appreciate today if possible.

## 2012-08-21 NOTE — Telephone Encounter (Signed)
Called to the pharmacy and left on voicemail. 

## 2012-08-21 NOTE — Telephone Encounter (Signed)
Last filled 05/07/12 #60 with 3 additional refills. Please advise.  Thanks!!

## 2012-08-21 NOTE — Telephone Encounter (Signed)
Please send this in as directed

## 2012-08-26 ENCOUNTER — Other Ambulatory Visit: Payer: Self-pay | Admitting: *Deleted

## 2012-08-26 DIAGNOSIS — F4323 Adjustment disorder with mixed anxiety and depressed mood: Secondary | ICD-10-CM

## 2012-08-26 MED ORDER — DONEPEZIL HCL 10 MG PO TABS: 10.0000 mg | ORAL_TABLET | Freq: Every morning | ORAL | Status: DC

## 2012-09-22 ENCOUNTER — Other Ambulatory Visit: Payer: Self-pay | Admitting: Internal Medicine

## 2012-09-23 ENCOUNTER — Other Ambulatory Visit: Payer: Self-pay | Admitting: Internal Medicine

## 2012-09-23 ENCOUNTER — Other Ambulatory Visit: Payer: Self-pay | Admitting: *Deleted

## 2012-09-29 ENCOUNTER — Institutional Professional Consult (permissible substitution): Payer: Self-pay | Admitting: Neurology

## 2012-09-30 ENCOUNTER — Institutional Professional Consult (permissible substitution): Payer: Self-pay | Admitting: Neurology

## 2012-09-30 ENCOUNTER — Ambulatory Visit: Payer: Self-pay | Admitting: Neurology

## 2012-10-07 ENCOUNTER — Ambulatory Visit (INDEPENDENT_AMBULATORY_CARE_PROVIDER_SITE_OTHER): Payer: Managed Care, Other (non HMO) | Admitting: Neurology

## 2012-10-07 ENCOUNTER — Encounter: Payer: Self-pay | Admitting: Neurology

## 2012-10-07 VITALS — BP 142/92 | HR 67 | Temp 97.4°F | Ht 68.0 in | Wt 241.0 lb

## 2012-10-07 DIAGNOSIS — M810 Age-related osteoporosis without current pathological fracture: Secondary | ICD-10-CM

## 2012-10-07 DIAGNOSIS — G4700 Insomnia, unspecified: Secondary | ICD-10-CM

## 2012-10-07 DIAGNOSIS — F4323 Adjustment disorder with mixed anxiety and depressed mood: Secondary | ICD-10-CM

## 2012-10-07 DIAGNOSIS — G620 Drug-induced polyneuropathy: Secondary | ICD-10-CM

## 2012-10-07 DIAGNOSIS — T451X5A Adverse effect of antineoplastic and immunosuppressive drugs, initial encounter: Secondary | ICD-10-CM

## 2012-10-07 DIAGNOSIS — R4189 Other symptoms and signs involving cognitive functions and awareness: Secondary | ICD-10-CM | POA: Insufficient documentation

## 2012-10-07 MED ORDER — TRAZODONE HCL 50 MG PO TABS: 50.0000 mg | ORAL_TABLET | Freq: Every day | ORAL | Status: DC

## 2012-10-07 MED ORDER — ZOLPIDEM TARTRATE 10 MG PO TABS: ORAL_TABLET | ORAL | Status: DC

## 2012-10-07 MED ORDER — DONEPEZIL HCL 10 MG PO TABS: 10.0000 mg | ORAL_TABLET | Freq: Every morning | ORAL | Status: DC

## 2012-10-07 NOTE — Progress Notes (Signed)
[facility]  Provider:  Dr Gorman Safi Referring Provider: Burnis Medin, MD Primary Care Physician:  Lottie Dawson, MD  Chief Complaint  Patient presents with  . New Evaluation    neuropathy, Panosh, paper referral , rm 11    HPI:  [name] is a 52 y.o. female here as a referral from Dr. Regis Bill for follow up for neuropathy. [name] is a 52 year old right-handed female I first encountered in 2010-.  And last over 3 years ago. She is de facto a new patient.   I  begun treating her with Aricept for memory loss . At the base of her of symptoms is a metastatic cancer with unknown primary tumor the patient underwent a complete hysterectomy this or for ectomy 2010. She begun and for all very uncomfortable as her oncologist based on the histology results from metastatic. I believe her chemotherapy begun already in December 2009. She became severely pain impaired at aching soreness and started to get some relief from Lyrica but remained with an impaired sense of fine touch temperature and pinprick as well as balance problems and ataxia. She is here today for refills but also for a reevaluation. Her last visit was in 2011 with me . The patient had that time suffered a backwards fall and a concussion. She was discharged after her head CT at the: ER but continued to have dizziness nausea of forgetfulness and was severely ataxic when I saw her. During my evaluation with the patient became evident that she has severe memory deficits she also has a neuropathy.   Review of Systems: Out of a complete 14 system review, the patient complains of only the following symptoms, and all other reviewed systems are negative. Memory loss, neuropathy, ataxia, anxiety, high cholesterol episodic hypertension.  History   Social History  . Marital Status: Divorced    Spouse Name: N/A    Number of Children: 1  . Years of Education: grad sch.   Occupational History  .     disabled   Social History Main Topics  . Smoking status: Current Every Day Smoker -- 1.00 packs/day for 37 years    Types: Cigarettes  . Smokeless tobacco: Never Used     Comment: 1/2 pack daily  . Alcohol Use: Yes     Comment: rare, 3 weekly  . Drug Use: No  . Sexually Active: Not on file   Other Topics Concern  . Not on file   Social History Narrative   Divorced   1 son /1 adopted adult son   Sales occupation on disability for now   Regular exercise-yes money stress   Daily caffeine use 2-3   On since a security disability since 2012    Family History  Problem Relation Age of Onset  . Pulmonary embolism Mother   . Hypertension Father   . Breast cancer Maternal Grandmother     diagnosed in early 58s  . Hyperlipidemia      Past Medical History  Diagnosis Date  . Hx pulmonary embolism   . Obesity   . Metrorrhagia   . Allergic rhinitis   . Iron deficiency anemia   . Hypertension   . Alopecia   . Vitamin D deficiency   . HLD (hyperlipidemia)   . Adjustment disorder   . Anxiety and depression   . Raynaud's syndrome   . Right groin mass   . 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 Squamous cell  carcinoma of skin   . Ovarian cancer 48  . Vision abnormalities   . Neuropathy     Past Surgical History  Procedure Laterality Date  . Breast reduction surgery  2000  . Gastric bypass  1979  . Tonsillectomy    . Cesarean section      1991  . Abdominal hysterectomy  05/2008    TAH/BSO  . Colonoscopy  2004; 12/07/10    hemorrhoids    Current Outpatient Prescriptions  Medication Sig Dispense Refill  . donepezil (ARICEPT) 10 MG tablet Take 1 tablet (10 mg total) by mouth every morning.  30 tablet  3  . DULoxetine (CYMBALTA) 60 MG capsule Take 60 mg by mouth daily.      . ergocalciferol (VITAMIN D2) 50000 UNITS capsule Take 1 capsule (50,000 Units total) by mouth once a week.  12 capsule  3  . Fish Oil-Cholecalciferol (FISH OIL + D3 PO)  Take by mouth every morning.      . Glucosamine-Chondroitin (GLUCOSAMINE CHONDROITIN COMPLX) 500-250 MG CAPS Take by mouth every morning.      Marland Kitchen KLOR-CON 10 10 MEQ tablet TAKE 1 TABLET BY MOUTH DAILY  30 tablet  1  . Multiple Vitamins-Minerals (MULTIVITAL PO) Take by mouth every morning.      . orphenadrine (NORFLEX) 100 MG tablet TAKE 1 TABLET BY MOUTH TWICE DAILY  60 tablet  0  . pregabalin (LYRICA) 150 MG capsule Take 1 capsule (150 mg total) by mouth 2 (two) times daily.  60 capsule  3  . simvastatin (ZOCOR) 40 MG tablet TAKE 1 TABLET BY MOUTH DAILY  30 tablet  3  . triamterene-hydrochlorothiazide (MAXZIDE-25) 37.5-25 MG per tablet TAKE 2 TABLETS BY MOUTH DAILY  60 tablet  0  . zolpidem (AMBIEN) 10 MG tablet Take 2 pills at night for sleep.  60 tablet  2   No current facility-administered medications for this visit.    Allergies as of 10/07/2012 - Review Complete 10/07/2012  Allergen Reaction Noted  . Codeine    . Orange Itching and Rash 07/06/2010    Vitals: BP 142/92  Pulse 67  Temp(Src) 97.4 F (36.3 C) (Oral)  Ht 5' 8"  (1.727 m)  Wt 241 lb (109.317 kg)  BMI 36.65 kg/m2 Last Weight:  Wt Readings from Last 1 Encounters:  10/07/12 241 lb (109.317 kg)   Last Height:   Ht Readings from Last 1 Encounters:  10/07/12 5' 8"  (1.727 m)   Vision Screening:  See vitals  Physical exam:  General: The patient is awake, alert and appears not in acute distress. The patient is well groomed. Head: Normocephalic, atraumatic. Neck is supple. Mallampati3 , neck circumference:16.5 , thick neck.  Cardiovascular:  Regular rate and rhythm, without  murmurs or carotid bruit, and without distended neck veins. Respiratory: Lungs are clear to auscultation. Skin:  Without evidence of edema, or rash Trunk: BMI iselevated and patient  has normal posture.  Neurologic exam : The patient is awake and alert, oriented to place and time.  Memory: tested by MoCa : . There is a normal attention span &  concentration ability. Speech is fluent without  dysarthria, dysphonia or aphasia. Mood and affect are appropriate.  Cranial nerves: Pupils are equal and briskly reactive to light. Funduscopic exam without evidence of pallor or edema. Extraocular movements  in vertical and horizontal planes intact and without nystagmus. Visual fields by finger perimetry are intact. Hearing to finger rub intact.  Facial sensation intact to fine touch. Facial motor  strength is symmetric and tongue and uvula move midline.  Motor exam:  Normal tone and normal muscle bulk and symmetric normal strength in all extremities.  Sensory:  Fine touch, pinprick and vibration were tested in all extremities. Proprioception is tested in the upper extremities only.   Coordination: Rapid alternating movements in the fingers/hands is tested and normal. Finger-to-nose maneuver tested and normal without evidence of ataxia, dysmetria or tremor.  Gait and station: Patient walks without assistive device and is able and assisted stool climb up to the exam table. Strength within normal limits. Stance is stable and normal. Tandem gait is ataxic , turns with  4 Steps are unfragmented. Romberg testing shows her swaying , but she did not need corrective steps , did not pro pulse or retro pulse.   Deep tendon reflexes: in the  upper and lower extremities are symmetric and intact. Babinski maneuver response is  downgoing.   Assessment:  After physical and neurologic examination, review of laboratory studies, imaging,  neurophysiology testing and pre-existing records, assessment will be reviewed on the problem list. Please note that this patients records from 3 plus years ago  are in centricity. MRI , PSG , Labs. The patient is on CYMBALTA and LYRICA and cannot add SSRI.      Since my last encounter with this patient over 3 years ago I note his a significant improvement in her overall mood  And her physical stability. The patient still has  ataxia, her tandem gait is impaired her Romberg is abnormal. 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 Vibration and fine touch sensation are significantly diminished in both feet and at the ankle level. . In addition there was significant memory loss and the patient now seems to be lost without her of I pad or notebook. Her MMSE  was 25/30 points she missed all 3 recall words, could not name the type of building and the date. She has no problems remembering events from 20 years ago her short-term memory is severely impaired. She also is unable to read a book or watch several installments of a series on TV, since earlier events in the story line  are not registered. Or even some episodes of apraxia as the patient under and how to use her come her she did not know up to operate the device.  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 She would also not be able to do a physically demanding job her long-standing walking if necessary, due to her ataxia and neuropathy.  Plan:  Treatment plan and additional workup will be reviewed under Problem List.

## 2012-10-07 NOTE — Patient Instructions (Signed)
Dementia Dementia is a word that is used to describe problems with the brain and how it works. People with dementia have memory loss. They may also have problems with thinking, speaking, or solving problems. It can affect how they act around people, how they do their job, their mood, and their personality. These changes may not show up for a long time. Family or friends may not notice problems in the early part of this disease. HOME CARE The following tips are for the person living with, or caring for, the person with dementia. Make the home safe.  Remove locks on bathroom doors.  Use childproof locks on cabinets where alcohol, cleaning supplies, or chemicals are stored.  Put outlet covers in electrical outlets.  Put in childproof locks to keep doors and windows safe.  Remove stove knobs, or put in safety knobs that shut off on their own.  Lower the temperature on water heaters.  Label medicines. Lock them in a safe place.  Keep knives, lighters, matches, power tools, and guns out of reach or in a safe place.  Remove objects that might break or can hurt the person.  Make sure lighting is good inside and outside.  Put in grab bars if needed.  Use a device that detects falls or other needs for help. Lessen confusion.  Keep familiar objects and people around.  Use night lights or low lit (dim) lights at night.  Label objects or areas.  Use reminders, notes, or directions for daily activities or tasks.  Keep a simple routine that is the same for waking, meals, bathing, dressing, and bedtime.  Create a calm and quiet home.  Put up clocks and calendars.  Keep emergency numbers and the home address near all phones.  Help show the different times of day. Open the curtains during the day to let light in. Speak clearly and directly.  Choose simple words and short sentences.  Use a gentle, calm voice.  Do not interrupt.  If the person has a hard time finding a word to  use, give them the word or thought.  Ask 1 question at a time. Give enough time for the person to answer. Repeat the question if the person does not answer. Do things that lessen restlessness.  Provide a comfortable bed.  Have the same bedtime routine every night.  Have a regular walking and activity schedule.  Lessen naps during the day.  Do not let the person drink a lot of caffeine.  Go to events that are not overwhelming. Eat well and drink fluids.  Lessen distractions during meal times and snacks.  Avoid foods that are too hot or too cold.  Watch how the person chews and swallows. This is to make sure they do not choke. Other  Keep all vision, hearing, dental, and medical visits with the doctor.  Only give medicines as told by the doctor.  Watch the person's driving ability. Do not let the person drive if he or she cannot drive safely.  Use a program that helps find a person if they become missing. You may need to register with this program. GET HELP RIGHT AWAY IF:   A fever of 102 F (38.9 C) develops.  Confusion develops or gets worse.  Sleepiness develops or gets worse.  Staying awake is hard to do.  New behavior problems start like mood swings, aggression, and seeing things that are not there.  Problems with balance, speech, or falling develop.  Problems swallowing develop.  Any  problems of another sickness develop. MAKE SURE YOU:  Understand these instructions.  Will watch his or her condition.  Will get help right away if he or she is not doing well or gets worse. Document Released: 04/11/2008 Document Revised: 07/22/2011 Document Reviewed: 09/24/2010 [employer] Patient Information 2014 [place], [place]. Chemotherapy Many people are apprehensive about chemotherapy due to concerns over uncomfortable side effects. However, managements for side effects have come a long way. Many side effects once associated with chemotherapy can be prevented and/or  controlled. WHAT IS CHEMOTHERAPY? Chemotherapy is the general term for any treatment involving the use of chemical agents. Chemotherapy can be given through a vein, most commonly through an implanted port* or PICC line.* It can also be delivered by mouth (orally) in the form of a pill. The main goal of chemotherapy is to kill cancer cells and stop them from growing. It can destroy and eliminate cancer cells where the cancer started (primary tumor location) and throughout the body, often far away from the original cancer. It is a treatment that not only targets the original cancer location, but also the entire body (systemic treatment) for full effect and results. Chemotherapy works by destroying cancer cells. Unfortunately, it cannot tell the difference between a cancer cell and some healthy cells. This results in the death of noncancerous cells, such as hair and blood cells. Harm to healthy cells is what causes side effects. These cells usually repair themselves after chemotherapy. Because some drugs work better together rather than alone, 2 or more drugs are often given at the same time. This is called combination chemotherapy. Depending on the type of cancer and how advanced it is, chemotherapy can be used for different goals:  Cure the cancer.  Keep the cancer from spreading.  Slow the cancer's growth.  Kill cancer cells that may have spread to other parts of the body from the original tumor.  Relieve symptoms caused by cancer. You and your caregiver will decide what drug or combination of drugs you will get. Your caregiver will choose the doses, how the drugs will be given, how often, and how long you will get treatment. All of these decisions will depend on the type of cancer, where it is, how big it is, and how it is affecting your normal body functions and overall health. *Implanted port - A device that is implanted under your skin so that medicines may be delivered directly into your blood  system. *PICC line (peripherally inserted central catheter) - A long, slender, flexible tube. This tube is often inserted into a vein, typically in the upper arm. The tip stops in the large central vein that leads to your heart. Document Released: 02/24/2007 Document Revised: 07/22/2011 Document Reviewed: 08/11/2008 [facility] Patient Information 2014 [place], [place].

## 2012-10-08 ENCOUNTER — Other Ambulatory Visit: Payer: Self-pay | Admitting: Internal Medicine

## 2012-10-12 ENCOUNTER — Other Ambulatory Visit: Payer: Self-pay | Admitting: Internal Medicine

## 2012-10-12 NOTE — Telephone Encounter (Signed)
error 

## 2012-10-13 ENCOUNTER — Other Ambulatory Visit: Payer: Self-pay | Admitting: Neurology

## 2012-10-14 ENCOUNTER — Other Ambulatory Visit: Payer: Medicare Other

## 2012-10-14 ENCOUNTER — Telehealth: Payer: Self-pay | Admitting: Hematology & Oncology

## 2012-10-14 ENCOUNTER — Telehealth: Payer: Self-pay | Admitting: *Deleted

## 2012-10-14 ENCOUNTER — Other Ambulatory Visit: Payer: Self-pay | Admitting: Neurology

## 2012-10-14 ENCOUNTER — Telehealth: Payer: Self-pay | Admitting: Neurology

## 2012-10-14 DIAGNOSIS — G620 Drug-induced polyneuropathy: Secondary | ICD-10-CM

## 2012-10-14 DIAGNOSIS — G4700 Insomnia, unspecified: Secondary | ICD-10-CM

## 2012-10-14 DIAGNOSIS — M810 Age-related osteoporosis without current pathological fracture: Secondary | ICD-10-CM

## 2012-10-14 MED ORDER — ZOLPIDEM TARTRATE ER 12.5 MG PO TBCR: 12.5000 mg | EXTENDED_RELEASE_TABLET | Freq: Every evening | ORAL | Status: DC | PRN

## 2012-10-14 NOTE — Telephone Encounter (Signed)
Patient called stating Cyril Mourning is the medication isn't working for her. Patient stated she is waking up with a terrible headache in the middle of the night and she is having nightmares.  Patient also stated taking one Ambien doesn't work.

## 2012-10-14 NOTE — Telephone Encounter (Signed)
Please call patient and ask her to take one half of the trazodone only- give it 3 nights.

## 2012-10-14 NOTE — Telephone Encounter (Signed)
I called and left a message for the patient to callback to the office to give the name of the new medication that isn't working.

## 2012-10-14 NOTE — Telephone Encounter (Signed)
I called and spoke with the [name] to inform her that per Dr. Brett Fairy to take 1/2 tablet of Trazodone. [name] stated she has been taking Trazodone for 7 days and she still has those same symptoms as headache and nightmares.

## 2012-10-14 NOTE — Telephone Encounter (Signed)
Pt called made 6-6- flush appointment and 6-18 MD appointments

## 2012-10-15 ENCOUNTER — Other Ambulatory Visit (INDEPENDENT_AMBULATORY_CARE_PROVIDER_SITE_OTHER): Payer: Medicare Other

## 2012-10-15 ENCOUNTER — Other Ambulatory Visit: Payer: Medicare Other

## 2012-10-15 DIAGNOSIS — I10 Essential (primary) hypertension: Secondary | ICD-10-CM

## 2012-10-15 DIAGNOSIS — E785 Hyperlipidemia, unspecified: Secondary | ICD-10-CM

## 2012-10-15 DIAGNOSIS — D649 Anemia, unspecified: Secondary | ICD-10-CM

## 2012-10-15 DIAGNOSIS — E876 Hypokalemia: Secondary | ICD-10-CM

## 2012-10-15 DIAGNOSIS — Z0000 Encounter for general adult medical examination without abnormal findings: Secondary | ICD-10-CM

## 2012-10-15 LAB — CBC WITH DIFFERENTIAL/PLATELET
Basophils Relative: 0.3 % (ref 0.0–3.0)
Eosinophils Relative: 1.3 % (ref 0.0–5.0)
Hemoglobin: 11.8 g/dL — ABNORMAL LOW (ref 12.0–15.0)
Lymphocytes Relative: 29.1 % (ref 12.0–46.0)
MCHC: 32.7 g/dL (ref 30.0–36.0)
Monocytes Relative: 6.9 % (ref 3.0–12.0)
Neutro Abs: 3.5 10*3/uL (ref 1.4–7.7)
RBC: 4.3 Mil/uL (ref 3.87–5.11)

## 2012-10-15 LAB — BASIC METABOLIC PANEL
CO2: 27 mEq/L (ref 19–32)
Calcium: 9.5 mg/dL (ref 8.4–10.5)
Creatinine, Ser: 1 mg/dL (ref 0.4–1.2)
Glucose, Bld: 104 mg/dL — ABNORMAL HIGH (ref 70–99)

## 2012-10-15 LAB — LIPID PANEL
HDL: 39.9 mg/dL (ref >39.00)
Total CHOL/HDL Ratio: 3
Triglycerides: 85 mg/dL (ref 0.0–149.0)

## 2012-10-15 LAB — HEPATIC FUNCTION PANEL
ALT: 27 U/L (ref 0–35)
AST: 29 U/L (ref 0–37)
Alkaline Phosphatase: 80 U/L (ref 39–117)
Bilirubin, Direct: 0 mg/dL (ref 0.0–0.3)
Total Protein: 6.7 g/dL (ref 6.0–8.3)

## 2012-10-16 ENCOUNTER — Ambulatory Visit (HOSPITAL_BASED_OUTPATIENT_CLINIC_OR_DEPARTMENT_OTHER): Payer: Medicare Other

## 2012-10-16 VITALS — BP 126/87 | HR 94 | Temp 98.1°F

## 2012-10-16 DIAGNOSIS — Z452 Encounter for adjustment and management of vascular access device: Secondary | ICD-10-CM

## 2012-10-16 DIAGNOSIS — C801 Malignant (primary) neoplasm, unspecified: Secondary | ICD-10-CM

## 2012-10-16 MED ORDER — SODIUM CHLORIDE 0.9 % IJ SOLN
10.0000 mL | INTRAMUSCULAR | Status: DC | PRN
  Administered 2012-10-16: 10 mL via INTRAVENOUS
  Filled 2012-10-16: qty 10

## 2012-10-16 MED ORDER — HEPARIN SOD (PORK) LOCK FLUSH 100 UNIT/ML IV SOLN
500.0000 [IU] | Freq: Once | INTRAVENOUS | Status: AC | PRN
  Administered 2012-10-16: 500 [IU] via INTRAVENOUS
  Filled 2012-10-16: qty 5

## 2012-10-21 ENCOUNTER — Other Ambulatory Visit (HOSPITAL_COMMUNITY)
Admission: RE | Admit: 2012-10-21 | Discharge: 2012-10-21 | Disposition: A | Discharge status: Home / Self Care | Payer: Medicare Other | Source: Ambulatory Visit | Attending: Internal Medicine | Admitting: Internal Medicine

## 2012-10-21 ENCOUNTER — Encounter: Payer: Self-pay | Admitting: Internal Medicine

## 2012-10-21 ENCOUNTER — Ambulatory Visit (INDEPENDENT_AMBULATORY_CARE_PROVIDER_SITE_OTHER): Payer: Medicare Other | Admitting: Internal Medicine

## 2012-10-21 VITALS — BP 120/90 | HR 98 | Temp 98.4°F | Ht 67.75 in | Wt 230.0 lb

## 2012-10-21 DIAGNOSIS — R4189 Other symptoms and signs involving cognitive functions and awareness: Secondary | ICD-10-CM

## 2012-10-21 DIAGNOSIS — R29818 Other symptoms and signs involving the nervous system: Secondary | ICD-10-CM

## 2012-10-21 DIAGNOSIS — L989 Disorder of the skin and subcutaneous tissue, unspecified: Secondary | ICD-10-CM

## 2012-10-21 DIAGNOSIS — D649 Anemia, unspecified: Secondary | ICD-10-CM

## 2012-10-21 DIAGNOSIS — Z01419 Encounter for gynecological examination (general) (routine) without abnormal findings: Secondary | ICD-10-CM

## 2012-10-21 DIAGNOSIS — E785 Hyperlipidemia, unspecified: Secondary | ICD-10-CM

## 2012-10-21 DIAGNOSIS — Z15068 Genetic susceptibility to other malignant neoplasm of digestive system: Secondary | ICD-10-CM

## 2012-10-21 DIAGNOSIS — G622 Polyneuropathy due to other toxic agents: Secondary | ICD-10-CM

## 2012-10-21 DIAGNOSIS — F09 Unspecified mental disorder due to known physiological condition: Secondary | ICD-10-CM

## 2012-10-21 DIAGNOSIS — E876 Hypokalemia: Secondary | ICD-10-CM

## 2012-10-21 DIAGNOSIS — I10 Essential (primary) hypertension: Secondary | ICD-10-CM

## 2012-10-21 DIAGNOSIS — Z1501 Genetic susceptibility to malignant neoplasm of breast: Secondary | ICD-10-CM

## 2012-10-21 DIAGNOSIS — Z1502 Genetic susceptibility to malignant neoplasm of ovary: Secondary | ICD-10-CM

## 2012-10-21 DIAGNOSIS — C801 Malignant (primary) neoplasm, unspecified: Secondary | ICD-10-CM

## 2012-10-21 DIAGNOSIS — Z0000 Encounter for general adult medical examination without abnormal findings: Secondary | ICD-10-CM

## 2012-10-21 MED ORDER — POTASSIUM CHLORIDE ER 10 MEQ PO TBCR: 20.0000 meq | EXTENDED_RELEASE_TABLET | Freq: Every day | ORAL | Status: DC

## 2012-10-21 MED ORDER — ORPHENADRINE CITRATE ER 100 MG PO TB12: ORAL_TABLET | ORAL | Status: DC

## 2012-10-21 NOTE — Progress Notes (Signed)
Chief Complaint  Patient presents with  . Annual Exam    multiple issues    HPI: Patient comes in today for [place] visit  Since last visit   Has seen neuro dohmeir  Helped with disability documentation.  Down to 76. 5 ambien trying she is changing meds  But sleep is an issue.  Generally feels better   Has area behind left ear  For months scaly ? Should be removed   Would like me to rx norflex  orig from dr Marin Olp but need meds transferred that are not related to  Cancer. Has taken bid and helps ms . Issues  LIPiDS denies se of meds  Hx of iron def anemai  And iron  In fusions  Doesn't absorb ironmucch no blood in stool bleeding.  Last seen Dr Carren Rang a couple of years ago and she is supposed to get paps despite having total hyst   And no cervical cancer .  No sx . Can we do this while here?  Bp  Has beenon  On 10 of kcl supp  ROS:  GEN/ HEENT: No fever, significant weight changes sweats headaches vision problems hearing changes, CV/ PULM; No chest pain shortness of breath cough, syncope,edema  change in exercise tolerance. GI /GU: No adominal pain, vomiting, change in bowel habits. No blood in the stool. No significant GU symptoms. SKIN/HEME: ,no acute skin rashes  or bleeding. No lymphadenopathy, nodules, masses.  NEURO/ PSYCH:  No new  neurologic signs No depression anxiety. IMM/ Allergy: No unusual infections.  Allergy .   REST of 12 system review negative except as per HPI   Past Medical History  Diagnosis Date  . Hx pulmonary embolism   . Obesity   . Metrorrhagia   . Allergic rhinitis   . Iron deficiency anemia   . Hypertension   . Alopecia   . Vitamin D deficiency   . HLD (hyperlipidemia)   . Adjustment disorder   . Anxiety and depression   . Raynaud's syndrome   . Right groin mass   . 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 Squamous cell carcinoma of skin   . Ovarian cancer 48  . Vision abnormalities   . Neuropathy      Family History  Problem Relation Age of Onset  . Pulmonary embolism Mother   . Hypertension Father   . Breast cancer Maternal Grandmother     diagnosed in early 65s  . Hyperlipidemia     Past Surgical History  Procedure Laterality Date  . Breast reduction surgery  2000  . Gastric bypass  1979  . Tonsillectomy    . Cesarean section      1991  . Abdominal hysterectomy  05/2008    TAH/BSO  . Colonoscopy  2004; 12/07/10    hemorrhoids     History   Social History  . Marital Status: Divorced    Spouse Name: N/A    Number of Children: 1  . Years of Education: grad sch.   Occupational History  .      disabled   Social History Main Topics  . Smoking status: Current Every Day Smoker -- 1.00 packs/day for 37 years    Types: Cigarettes  . Smokeless tobacco: Never Used     Comment: 1/2 pack daily  . Alcohol Use: Yes     Comment: rare, 3 weekly  . Drug Use: No  . Sexually Active: None   Other  Topics Concern  . None   Social History Narrative   Divorced   1 son /1 adopted adult son   Sales occupation on disability for now   Regular exercise-yes money stress   Daily caffeine use 2-3   On since a security disability since 2012    Outpatient Encounter Prescriptions as of 10/21/2012  Medication Sig Dispense Refill  . donepezil (ARICEPT) 10 MG tablet Take 1 tablet (10 mg total) by mouth every morning.  90 tablet  3  . DULoxetine (CYMBALTA) 60 MG capsule Take 60 mg by mouth daily.      . ergocalciferol (VITAMIN D2) 50000 UNITS capsule Take 1 capsule (50,000 Units total) by mouth once a week.  12 capsule  3  . Fish Oil-Cholecalciferol (FISH OIL + D3 PO) Take by mouth every morning.      . Glucosamine-Chondroitin (GLUCOSAMINE CHONDROITIN COMPLX) 500-250 MG CAPS Take by mouth every morning.      . Multiple Vitamins-Minerals (MULTIVITAL PO) Take by mouth every morning.      . orphenadrine (NORFLEX) 100 MG tablet TAKE 1 TABLET BY MOUTH TWICE DAILY  60 tablet  5  .  potassium chloride (KLOR-CON 10) 10 MEQ tablet Take 2 tablets (20 mEq total) by mouth daily.  60 tablet  2  . pregabalin (LYRICA) 150 MG capsule Take 1 capsule (150 mg total) by mouth 2 (two) times daily.  60 capsule  3  . simvastatin (ZOCOR) 40 MG tablet TAKE 1 TABLET BY MOUTH DAILY  30 tablet  3  . traZODone (DESYREL) 50 MG tablet Take 1 tablet (50 mg total) by mouth at bedtime.  45 tablet  1  . triamterene-hydrochlorothiazide (MAXZIDE-25) 37.5-25 MG per tablet TAKE 2 TABLETS BY MOUTH ONCE DAILY  60 tablet  2  . zolpidem (AMBIEN CR) 12.5 MG CR tablet Take 1 tablet (12.5 mg total) by mouth at bedtime as needed for sleep.  30 tablet  0  . [DISCONTINUED] KLOR-CON 10 10 MEQ tablet TAKE 1 TABLET BY MOUTH DAILY  30 tablet  1  . [DISCONTINUED] orphenadrine (NORFLEX) 100 MG tablet TAKE 1 TABLET BY MOUTH TWICE DAILY  60 tablet  0   No facility-administered encounter medications on file as of 10/21/2012.    EXAM:  BP 120/90  Pulse 98  Temp(Src) 98.4 F (36.9 C) (Oral)  Ht 5' 7.75" (1.721 m)  Wt 230 lb (104.327 kg)  BMI 35.22 kg/m2  SpO2 97%  Body mass index is 35.22 kg/(m^2).  Physical Exam: Vital signs reviewed MVH:QION is a well-developed well-nourished alert cooperative   female who appears her stated age in no acute distress.  HEENT: normocephalic atraumatic , Eyes: PERRL EOM's full, conjunctiva clear, Nares: paten,t no deformity discharge or tenderness., Ears: no deformity EAC's clear TMs with normal landmarks. Mouth: clear OP, no lesions, edema.  Moist mucous membranes. Dentition in adequate repair. NECK: supple without masses, thyromegaly or bruits. CHEST/PULM:  Clear to auscultation and percussion breath sounds equal no wheeze , rales or rhonchi. No chest wall deformities or tenderness. CV: PMI is nondisplaced, S1 S2 no gallops, murmurs, rubs. Peripheral pulses are full without delay.No JVD .  Breast: normal by inspection . No dimpling, discharge, masses, tenderness or discharge .well  healed scars reduction ABDOMEN: Bowel sounds normal nontender  No guard or rebound, no hepato splenomegal no CVA tenderness.  No hernia. Extremtities:  No clubbing cyanosis or edema, no acute joint swelling or redness no focal atrophy NEURO:  Oriented x3, cranial nerves 3-12 appear  to be intact, no obvious focal weakness,gait within normal limits  SKIN: No acute rashes normal turgor, color, no bruising or petechiae. Behind left ear 3 mm firm scaly bump  PSYCH: Oriented, good eye contact, no obvious depression anxiety,  LN: no cervical axillary inguinal adenopathy PELVIC  Vagina clear pap done  BM no masses  Uterus absent  Rectal no masses stool heme neg   Lab Results  Component Value Date   WBC 5.6 10/15/2012   HGB 11.8* 10/15/2012   HCT 36.2 10/15/2012   PLT 234.0 10/15/2012   GLUCOSE 104* 10/15/2012   CHOL 133 10/15/2012   TRIG 85.0 10/15/2012   HDL 39.90 10/15/2012   LDLDIRECT 98.7 09/04/2009   LDLCALC 76 10/15/2012   ALT 27 10/15/2012   AST 29 10/15/2012   NA 139 10/15/2012   K 3.3* 10/15/2012   CL 103 10/15/2012   CREATININE 1.0 10/15/2012   BUN 20 10/15/2012   CO2 27 10/15/2012   TSH 2.70 10/15/2012   INR 3.1 11/28/2010    ASSESSMENT AND PLAN:  Discussed the following assessment and plan:  Visit for preventive health examination  Encounter for routine gynecological examination - Plan: PAP [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 will send  result to dr Marin Olp   Patient Care Team: Burnis Medin, MD as PCP - General (Internal Medicine) Imogene Burn. Georgette Dover, MD (General Surgery) Gatha Mayer, MD (Gastroenterology) Tyrone Sage, PsyD (Psychiatry) Patient  Instructions   Increase the potassium to 2 10 meq per day and check potassium level in 3-4 weeks ( BMP)  And magnesium    Can fu with dr Marin Olp about the mild anemia .    We can refill the norflex at this time.   Continue   Advice with dr Dohmeier regarding  Sleep difficulties.  Getting off of the Lorrin Mais is a good idea .    Cholesterol is good continue .  Medications.    Stop tobacco .   Will do referral to skin surgery center about the  Lesion behind your ear.    Will let you know about pap smear.      Standley Brooking. Lyal Husted M.D. Health Maintenance  Topic Date Due  . Tetanus/tdap  12/11/2012  . Influenza Vaccine  01/11/2013  . Mammogram  06/22/2014  . Colonoscopy  12/05/2020   Health Maintenance Review }

## 2012-10-21 NOTE — Patient Instructions (Addendum)
   Increase the potassium to 2 10 meq per day and check potassium level in 3-4 weeks ( BMP)  And magnesium    Can fu with dr Marin Olp about the mild anemia .    We can refill the norflex at this time.   Continue   Advice with dr Dohmeier regarding  Sleep difficulties.  Getting off of the Lorrin Mais is a good idea .    Cholesterol is good continue .  Medications.    Stop tobacco .   Will do referral to skin surgery center about the  Lesion behind your ear.    Will let you know about pap smear.

## 2012-10-22 ENCOUNTER — Encounter: Payer: Self-pay | Admitting: Internal Medicine

## 2012-10-22 DIAGNOSIS — L989 Disorder of the skin and subcutaneous tissue, unspecified: Secondary | ICD-10-CM | POA: Insufficient documentation

## 2012-10-22 DIAGNOSIS — D649 Anemia, unspecified: Secondary | ICD-10-CM | POA: Insufficient documentation

## 2012-10-22 DIAGNOSIS — Z01419 Encounter for gynecological examination (general) (routine) without abnormal findings: Secondary | ICD-10-CM | POA: Insufficient documentation

## 2012-10-26 NOTE — Progress Notes (Signed)
Quick Note:  Tell patient PAP is normal. Send copy to Dr Carren Rang also  thanks ______

## 2012-10-27 ENCOUNTER — Encounter: Payer: Self-pay | Admitting: Family Medicine

## 2012-10-28 ENCOUNTER — Ambulatory Visit (HOSPITAL_BASED_OUTPATIENT_CLINIC_OR_DEPARTMENT_OTHER): Payer: Medicare Other | Admitting: Hematology & Oncology

## 2012-10-28 ENCOUNTER — Other Ambulatory Visit (HOSPITAL_BASED_OUTPATIENT_CLINIC_OR_DEPARTMENT_OTHER): Payer: Medicare Other | Admitting: Lab

## 2012-10-28 VITALS — BP 114/81 | HR 83 | Temp 98.0°F | Resp 16 | Ht 67.0 in | Wt 228.0 lb

## 2012-10-28 DIAGNOSIS — C801 Malignant (primary) neoplasm, unspecified: Secondary | ICD-10-CM

## 2012-10-28 DIAGNOSIS — M810 Age-related osteoporosis without current pathological fracture: Secondary | ICD-10-CM

## 2012-10-28 DIAGNOSIS — D649 Anemia, unspecified: Secondary | ICD-10-CM

## 2012-10-28 DIAGNOSIS — E559 Vitamin D deficiency, unspecified: Secondary | ICD-10-CM

## 2012-10-28 NOTE — Progress Notes (Signed)
This office note has been dictated.

## 2012-10-29 NOTE — Progress Notes (Signed)
CC:   Standley Brooking. Regis Bill, MD Larey Seat, M.D.  DIAGNOSES: 1. 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 2. Intermittent iron-deficiency anemia. 3. BRCA1 positive. 4. Chemotherapy-induced neuropathy.  CURRENT THERAPY:  IV iron as indicated.  INTERIM HISTORY:  Ms. Bowron comes in for followup.  She is doing okay.  We last saw her back in December.  I think she has missed a couple appointments.  She is trying to deal with that fact that one of her sons has moved away to [place].  This has been very difficult for her.  She actually is thinking about moving out to [place].  However, it is very expensive and she may not be able to do this.  Otherwise, she is doing okay.  She is working out more.  She has lost 13 pounds since we saw her.  She actually belongs to the same gym that I do.  She did have some back issues.  This was after working out.  She feels that this is getting better.  This probably happened about 3 weeks ago.  She has had no change in bowel or bladder habits.  She is eating okay. She is watching what she eats a little bit better.  She has had no leg swelling.  There have been no rashes.  There has been no cough or shortness of breath.  She has had no fever, sweats or chills.  PHYSICAL EXAMINATION:  General:  This is a well-developed, well- nourished black female in no obvious distress.  Vital signs: Temperature of 98, pulse 83, respiratory rate 16, blood pressure 114/81. Weight is 228.  Head and neck:  Normocephalic, atraumatic skull.  There are no ocular or oral lesions.  There are no palpable cervical or supraclavicular lymph nodes.  Lungs:  Clear bilaterally.  Cardiac: Regular rate and rhythm with a normal S1 and S2.  There are no murmurs, rubs, or bruits.  Abdomen:  Soft with good bowel sounds.  There is no palpable abdominal mass.  There is no fluid wave.  There is no palpable hepatosplenomegaly.  Back:  No  tenderness over the spine, ribs, or hips. Extremities:  No clubbing, cyanosis, or edema.  Neurological:  No focal neurological deficits.  LAB STUDIES:  (10/15/2012)  White cell count of 5.6, hemoglobin 11.8, hematocrit 36.2, platelet count 234.  MCV is 84.  BUN and creatinine are 20 and 1.0.  Calcium is 9.5 with an albumin of 3.6.  LFTs are normal.  IMPRESSION:  Ms. Valadez is a 52 year old African American female with a history of a poorly differentiated carcinoma.  She underwent hysterectomy.  Molecular profiling suggested that this was an ovarian cancer.  This would certainly go along with her having a BRCA1 positivity.  I believe that she is cured of this.  I do not see any evidence of cancer recurrence.  We will go ahead and give her iron.  This typically helps her.  She does sleep issues.  Dr. Brett Fairy of Neurology is helping [place] out with this.  I want to see her back in about 3 months' time now.    ______________________________ [name], M.D. PRE/MEDQ  D:  10/28/2012  T:  10/29/2012  Job:  [id number]

## 2012-11-03 ENCOUNTER — Ambulatory Visit (HOSPITAL_BASED_OUTPATIENT_CLINIC_OR_DEPARTMENT_OTHER): Payer: Medicare Other

## 2012-11-03 VITALS — BP 115/88 | HR 96 | Temp 96.6°F

## 2012-11-03 DIAGNOSIS — D509 Iron deficiency anemia, unspecified: Secondary | ICD-10-CM

## 2012-11-03 DIAGNOSIS — C4490 Unspecified malignant neoplasm of skin, unspecified: Secondary | ICD-10-CM

## 2012-11-03 DIAGNOSIS — D649 Anemia, unspecified: Secondary | ICD-10-CM

## 2012-11-03 DIAGNOSIS — C801 Malignant (primary) neoplasm, unspecified: Secondary | ICD-10-CM

## 2012-11-03 DIAGNOSIS — Z1501 Genetic susceptibility to malignant neoplasm of breast: Secondary | ICD-10-CM

## 2012-11-03 MED ORDER — HEPARIN SOD (PORK) LOCK FLUSH 100 UNIT/ML IV SOLN
500.0000 [IU] | Freq: Once | INTRAVENOUS | Status: AC | PRN
  Administered 2012-11-03: 500 [IU] via INTRAVENOUS
  Filled 2012-11-03: qty 5

## 2012-11-03 MED ORDER — SODIUM CHLORIDE 0.9 % IJ SOLN
10.0000 mL | INTRAMUSCULAR | Status: DC | PRN
  Administered 2012-11-03: 10 mL via INTRAVENOUS
  Filled 2012-11-03: qty 10

## 2012-11-03 MED ORDER — SODIUM CHLORIDE 0.9 % IV SOLN
1020.0000 mg | Freq: Once | INTRAVENOUS | Status: AC
  Administered 2012-11-03: 1020 mg via INTRAVENOUS
  Filled 2012-11-03: qty 34

## 2012-11-03 MED ORDER — SODIUM CHLORIDE 0.9 % IV SOLN
Freq: Once | INTRAVENOUS | Status: AC
  Administered 2012-11-03: 11:00:00 via INTRAVENOUS

## 2012-11-03 NOTE — Patient Instructions (Addendum)
Ferumoxytol injection What is this medicine? FERUMOXYTOL is an iron complex. Iron is used to make healthy red blood cells, which carry oxygen and nutrients throughout the body. This medicine is used to treat iron deficiency anemia in people with chronic kidney disease. This medicine may be used for other purposes; ask your health care provider or pharmacist if you have questions. What should I tell my health care provider before I take this medicine? They need to know if you have any of these conditions: -anemia not caused by low iron levels -high levels of iron in the blood -magnetic resonance imaging (MRI) test scheduled -an unusual or allergic reaction to iron, other medicines, foods, dyes, or preservatives -pregnant or trying to get pregnant -breast-feeding How should I use this medicine? This medicine is for infusion into a vein. It is given by a health care professional in a hospital or clinic setting. Talk to your pediatrician regarding the use of this medicine in children. Special care may be needed. Overdosage: If you think you've taken too much of this medicine contact a poison control center or emergency room at once. Overdosage: If you think you have taken too much of this medicine contact a poison control center or emergency room at once. NOTE: This medicine is only for you. Do not share this medicine with others. What if I miss a dose? It is important not to miss your dose. Call your doctor or health care professional if you are unable to keep an appointment. What may interact with this medicine? This medicine may interact with the following medications: -other iron products This list may not describe all possible interactions. Give your health care provider a list of all the medicines, herbs, non-prescription drugs, or dietary supplements you use. Also tell them if you smoke, drink alcohol, or use illegal drugs. Some items may interact with your medicine. What should I watch  for while using this medicine? Visit your doctor or healthcare professional regularly. Tell your doctor or healthcare professional if your symptoms do not start to get better or if they get worse. You may need blood work done while you are taking this medicine. You may need to follow a special diet. Talk to your doctor. Foods that contain iron include: whole grains/cereals, dried fruits, beans, or peas, leafy green vegetables, and organ meats (liver, kidney). What side effects may I notice from receiving this medicine? Side effects that you should report to your doctor or health care professional as soon as possible: -allergic reactions like skin rash, itching or hives, swelling of the face, lips, or tongue -breathing problems -changes in blood pressure -feeling faint or lightheaded, falls -fever or chills -flushing, sweating, or hot feelings -swelling of the ankles or feet Side effects that usually do not require medical attention (Report these to your doctor or health care professional if they continue or are bothersome.): -diarrhea -headache -nausea, vomiting -stomach pain This list may not describe all possible side effects. Call your doctor for medical advice about side effects. You may report side effects to FDA at 1-800-FDA-1088. Where should I keep my medicine? This drug is given in a hospital or clinic and will not be stored at home. NOTE: This sheet is a summary. It may not cover all possible information. If you have questions about this medicine, talk to your doctor, pharmacist, or health care provider.  2012, Elsevier/Gold Standard. (01/20/2008 9:48:25 PM) 

## 2012-11-06 ENCOUNTER — Ambulatory Visit: Payer: Medicare Other

## 2012-11-18 ENCOUNTER — Other Ambulatory Visit (INDEPENDENT_AMBULATORY_CARE_PROVIDER_SITE_OTHER): Payer: Medicare Other

## 2012-11-18 DIAGNOSIS — I10 Essential (primary) hypertension: Secondary | ICD-10-CM

## 2012-11-18 LAB — BASIC METABOLIC PANEL
CO2: 27 mEq/L (ref 19–32)
Calcium: 9.5 mg/dL (ref 8.4–10.5)
Glucose, Bld: 99 mg/dL (ref 70–99)
Potassium: 3.2 mEq/L — ABNORMAL LOW (ref 3.5–5.1)
Sodium: 139 mEq/L (ref 135–145)

## 2012-11-26 ENCOUNTER — Telehealth: Payer: Self-pay

## 2012-11-26 NOTE — Telephone Encounter (Signed)
Patient had question about appointment scheduling. I called last Friday and asked her to call me to discuss (Not recorded). She did not so I called again and again got her VM. I left a VM stating that I see she had an appointment with Dr. Brett Fairy on Oct 07, 2012 and has another appointment scheduled for Sep 22, 2013. If I do not hear back from patient I will consider patient's concerns resolved.

## 2012-11-30 ENCOUNTER — Other Ambulatory Visit: Payer: Self-pay | Admitting: Family Medicine

## 2012-11-30 DIAGNOSIS — E876 Hypokalemia: Secondary | ICD-10-CM

## 2012-12-01 DIAGNOSIS — Z0289 Encounter for other administrative examinations: Secondary | ICD-10-CM

## 2012-12-10 ENCOUNTER — Telehealth: Payer: Self-pay | Admitting: Neurology

## 2012-12-10 ENCOUNTER — Encounter (HOSPITAL_COMMUNITY): Payer: Self-pay | Admitting: Emergency Medicine

## 2012-12-10 ENCOUNTER — Emergency Department (HOSPITAL_COMMUNITY)
Admission: EM | Admit: 2012-12-10 | Discharge: 2012-12-10 | Disposition: A | Discharge status: Home / Self Care | Payer: Medicare Other | Attending: Emergency Medicine | Admitting: Emergency Medicine

## 2012-12-10 ENCOUNTER — Other Ambulatory Visit: Payer: Self-pay

## 2012-12-10 DIAGNOSIS — E669 Obesity, unspecified: Secondary | ICD-10-CM | POA: Insufficient documentation

## 2012-12-10 DIAGNOSIS — Z8742 Personal history of other diseases of the female genital tract: Secondary | ICD-10-CM | POA: Insufficient documentation

## 2012-12-10 DIAGNOSIS — I10 Essential (primary) hypertension: Secondary | ICD-10-CM | POA: Insufficient documentation

## 2012-12-10 DIAGNOSIS — I7300 Raynaud's syndrome without gangrene: Secondary | ICD-10-CM | POA: Insufficient documentation

## 2012-12-10 DIAGNOSIS — Z872 Personal history of diseases of the skin and subcutaneous tissue: Secondary | ICD-10-CM | POA: Insufficient documentation

## 2012-12-10 DIAGNOSIS — Y9389 Activity, other specified: Secondary | ICD-10-CM | POA: Insufficient documentation

## 2012-12-10 DIAGNOSIS — R300 Dysuria: Secondary | ICD-10-CM | POA: Insufficient documentation

## 2012-12-10 DIAGNOSIS — S335XXA Sprain of ligaments of lumbar spine, initial encounter: Secondary | ICD-10-CM | POA: Insufficient documentation

## 2012-12-10 DIAGNOSIS — Z8709 Personal history of other diseases of the respiratory system: Secondary | ICD-10-CM | POA: Insufficient documentation

## 2012-12-10 DIAGNOSIS — F341 Dysthymic disorder: Secondary | ICD-10-CM | POA: Insufficient documentation

## 2012-12-10 DIAGNOSIS — X503XXA Overexertion from repetitive movements, initial encounter: Secondary | ICD-10-CM | POA: Insufficient documentation

## 2012-12-10 DIAGNOSIS — Z8543 Personal history of malignant neoplasm of ovary: Secondary | ICD-10-CM | POA: Insufficient documentation

## 2012-12-10 DIAGNOSIS — Z8639 Personal history of other endocrine, nutritional and metabolic disease: Secondary | ICD-10-CM | POA: Insufficient documentation

## 2012-12-10 DIAGNOSIS — S39012A Strain of muscle, fascia and tendon of lower back, initial encounter: Secondary | ICD-10-CM

## 2012-12-10 DIAGNOSIS — Z8669 Personal history of other diseases of the nervous system and sense organs: Secondary | ICD-10-CM | POA: Insufficient documentation

## 2012-12-10 DIAGNOSIS — E785 Hyperlipidemia, unspecified: Secondary | ICD-10-CM | POA: Insufficient documentation

## 2012-12-10 DIAGNOSIS — Z862 Personal history of diseases of the blood and blood-forming organs and certain disorders involving the immune mechanism: Secondary | ICD-10-CM | POA: Insufficient documentation

## 2012-12-10 DIAGNOSIS — Z86711 Personal history of pulmonary embolism: Secondary | ICD-10-CM | POA: Insufficient documentation

## 2012-12-10 DIAGNOSIS — F17200 Nicotine dependence, unspecified, uncomplicated: Secondary | ICD-10-CM | POA: Insufficient documentation

## 2012-12-10 DIAGNOSIS — Z79899 Other long term (current) drug therapy: Secondary | ICD-10-CM | POA: Insufficient documentation

## 2012-12-10 DIAGNOSIS — Y92009 Unspecified place in unspecified non-institutional (private) residence as the place of occurrence of the external cause: Secondary | ICD-10-CM | POA: Insufficient documentation

## 2012-12-10 LAB — URINE MICROSCOPIC-ADD ON

## 2012-12-10 LAB — URINALYSIS, ROUTINE W REFLEX MICROSCOPIC
Glucose, UA: NEGATIVE mg/dL
Hgb urine dipstick: NEGATIVE
Specific Gravity, Urine: 1.022 (ref 1.005–1.030)

## 2012-12-10 MED ORDER — MORPHINE SULFATE 4 MG/ML IJ SOLN
6.0000 mg | Freq: Once | INTRAMUSCULAR | Status: AC
  Administered 2012-12-10: 6 mg via INTRAMUSCULAR
  Filled 2012-12-10: qty 2

## 2012-12-10 MED ORDER — DIAZEPAM 5 MG PO TABS: 5.0000 mg | ORAL_TABLET | Freq: Four times a day (QID) | ORAL | Status: DC | PRN

## 2012-12-10 MED ORDER — ZOLPIDEM TARTRATE ER 12.5 MG PO TBCR: 12.5000 mg | EXTENDED_RELEASE_TABLET | Freq: Every evening | ORAL | Status: DC | PRN

## 2012-12-10 MED ORDER — ONDANSETRON 4 MG PO TBDP
4.0000 mg | ORAL_TABLET | Freq: Once | ORAL | Status: AC
  Administered 2012-12-10: 4 mg via ORAL
  Filled 2012-12-10: qty 1

## 2012-12-10 NOTE — Telephone Encounter (Signed)
We have not gotten any refill requests.  Possibly because this is a duplicate chart.  I have submitted a ticket to get this chart merged with active chart MRN [id number].  I entered refill request on other chart.

## 2012-12-10 NOTE — ED Provider Notes (Signed)
CSN: [id number]     Arrival date & time 12/10/12  0020 History     First MD Initiated Contact with Patient 12/10/12 0113     Chief Complaint  Patient presents with  . Back Pain   (Consider location/radiation/quality/duration/timing/severity/associated sxs/prior Treatment) HPI  [name] is a 52 y.o. female complaining of severe right lower back pain onset yesterday morning. Patient is taking acetaminophen at home with no relief. Exacerbated by standing and certain position. She reports that she has been more active than normal with heavy lifting and working in the yard last few days. Patient endorses a slight dysuria, denies fever, nausea vomiting, numbness, weakness, difficulty ambulating, change in bowel habits.   Past Medical History  Diagnosis Date  . Hx pulmonary embolism   . Obesity   . Metrorrhagia   . Allergic rhinitis   . Iron deficiency anemia   . Hypertension   . Alopecia   . Vitamin D deficiency   . HLD (hyperlipidemia)   . Adjustment disorder   . Anxiety and depression   . Raynaud's syndrome   . Right groin mass   . 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 Squamous cell carcinoma of skin   . Ovarian cancer 48  . Vision abnormalities   . Neuropathy    Past Surgical History  Procedure Laterality Date  . Breast reduction surgery  2000  . Gastric bypass  1979  . Tonsillectomy    . Cesarean section      1991  . Abdominal hysterectomy  05/2008    TAH/BSO  . Colonoscopy  2004; 12/07/10    hemorrhoids   Family History  Problem Relation Age of Onset  . Pulmonary embolism Mother   . Hypertension Father   . Breast cancer Maternal Grandmother     diagnosed in early 30s  . Hyperlipidemia     History  Substance Use Topics  . Smoking status: Current Every Day Smoker -- 1.00 packs/day for 37 years    Types: Cigarettes  . Smokeless tobacco: Never Used     Comment: 1/2 pack daily  . Alcohol Use: Yes     Comment: rare, 3 weekly    OB History   Grav Para Term Preterm Abortions TAB SAB Ect Mult Living                 Review of Systems 10 systems reviewed and found to be negative, except as noted in the HPI   Allergies  Codeine and Orange  Home Medications   Current Outpatient Rx  Name  Route  Sig  Dispense  Refill  . donepezil (ARICEPT) 10 MG tablet   Oral   Take 10 mg by mouth daily.         . DULoxetine (CYMBALTA) 60 MG capsule   Oral   Take 60 mg by mouth daily.         . ergocalciferol (VITAMIN D2) 50000 UNITS capsule   Oral   Take 50,000 Units by mouth once a week. Takes on saturday         . Fish Oil-Cholecalciferol (FISH OIL + D3 PO)   Oral   Take 1 capsule by mouth daily.          . Multiple Vitamins-Minerals (MULTIVITAL PO)   Oral   Take 1 tablet by mouth daily.          . orphenadrine (NORFLEX) 100 MG tablet   Oral   Take  100 mg by mouth 2 (two) times daily.         . potassium chloride (K-DUR) 10 MEQ tablet   Oral   Take 30 mEq by mouth daily.         . pregabalin (LYRICA) 150 MG capsule   Oral   Take 150 mg by mouth 2 (two) times daily.         . simvastatin (ZOCOR) 40 MG tablet   Oral   Take 40 mg by mouth every evening.         . triamterene-hydrochlorothiazide (MAXZIDE-25) 37.5-25 MG per tablet   Oral   Take 2 tablets by mouth daily.         Marland Kitchen zolpidem (AMBIEN CR) 12.5 MG CR tablet   Oral   Take 12.5 mg by mouth at bedtime as needed for sleep.          BP 133/84  Pulse 80  Temp(Src) 97.8 F (36.6 C) (Oral)  Resp 18  SpO2 98% Physical Exam  Nursing note and vitals reviewed. Constitutional: She is oriented to person, place, and time. She appears well-developed and well-nourished. No distress.  HENT:  Head: Normocephalic.  Eyes: Conjunctivae and EOM are normal. Pupils are equal, round, and reactive to light.  Neck: Normal range of motion.  Cardiovascular: Normal rate, regular rhythm and intact distal pulses.   Pulmonary/Chest: Effort  normal and breath sounds normal. No stridor. No respiratory distress. She has no wheezes. She has no rales. She exhibits no tenderness.  Abdominal: Soft. Bowel sounds are normal. She exhibits no distension and no mass. There is no tenderness. There is no rebound and no guarding.  Genitourinary:  No CVA tenderness bilaterally  Musculoskeletal: Normal range of motion.  Patient and relates with mildly antalgic gait.  Strength is 5 out of 5 to lower extremities, extensor hallucis longus is 5 out of 5 bilaterally.  Diffusely tender to palpation of the right lumbar paraspinal musculature with mild spasm.  Neurological: She is alert and oriented to person, place, and time. She displays normal reflexes.  Psychiatric: She has a normal mood and affect.    ED Course   Procedures (including critical care time)  Labs Reviewed  URINALYSIS, ROUTINE W REFLEX MICROSCOPIC - Abnormal; Notable for the following:    Leukocytes, UA TRACE (*)    All other components within normal limits  URINE MICROSCOPIC-ADD ON   No results found. 1. Lumbar strain, initial encounter     MDM   Filed Vitals:   12/10/12 0024  BP: 133/84  Pulse: 80  Temp: 97.8 F (36.6 C)  TempSrc: Oral  Resp: 18  SpO2: 98%     [name] is a 52 y.o. female severe right low back pain, dysuria. UA is not consistent with infection. Patient is in severe pain, she drove herself to the hospital I have advised her that I can give her pain medication but she will need some to call someone to pick her up. Patient says she can do this.   No neurological deficits and normal neuro exam.  Patient can walk but states is painful.  No loss of bowel or bladder control.  No concern for cauda equina.  No fever, night sweats, weight loss, h/o cancer, IVDU.  RICE protocol and pain medicine indicated and discussed with patient.    Medications  morphine 4 MG/ML injection 6 mg (6 mg Intramuscular Given 12/10/12 0320)  ondansetron (ZOFRAN-ODT)  disintegrating tablet 4 mg (4 mg Oral Given  12/10/12 0319)    Pt is hemodynamically stable, appropriate for, and amenable to discharge at this time. Pt verbalized understanding and agrees with care plan. All questions answered. Outpatient follow-up and specific return precautions discussed.    New Prescriptions   DIAZEPAM (VALIUM) 5 MG TABLET    Take 1 tablet (5 mg total) by mouth every 6 (six) hours as needed for anxiety (spasms).    Note: Portions of this report may have been transcribed using voice recognition software. Every effort was made to ensure accuracy; however, inadvertent computerized transcription errors may be present    Monico Blitz, PA-C 12/10/12 [phone]

## 2012-12-10 NOTE — ED Notes (Signed)
0322  Pt received morphine IM and drove herself to the hospital.  She was advised she could not drive after taking the medication

## 2012-12-10 NOTE — ED Provider Notes (Signed)
Medical screening examination/treatment/procedure(s) were performed by non-physician practitioner and as supervising physician I was immediately available for consultation/collaboration.   Delora Fuel, MD [id number]/[id number]

## 2012-12-10 NOTE — ED Notes (Signed)
PT. REPORTS LOW BACK PAIN ONSET YESTERDAY MORNING , DENIES INJURY OR FALL , AMBULATORY , SLIGHT DYSURIA WITH NO HEMATURIA . NO FEVER OR CHILLS.

## 2012-12-16 ENCOUNTER — Ambulatory Visit (HOSPITAL_BASED_OUTPATIENT_CLINIC_OR_DEPARTMENT_OTHER): Payer: Medicare Other

## 2012-12-16 ENCOUNTER — Telehealth: Payer: Self-pay | Admitting: *Deleted

## 2012-12-16 VITALS — BP 123/91 | HR 91 | Temp 96.9°F | Resp 20

## 2012-12-16 DIAGNOSIS — Z452 Encounter for adjustment and management of vascular access device: Secondary | ICD-10-CM

## 2012-12-16 DIAGNOSIS — C801 Malignant (primary) neoplasm, unspecified: Secondary | ICD-10-CM

## 2012-12-16 MED ORDER — HEPARIN SOD (PORK) LOCK FLUSH 100 UNIT/ML IV SOLN
500.0000 [IU] | Freq: Once | INTRAVENOUS | Status: AC | PRN
  Administered 2012-12-16: 500 [IU] via INTRAVENOUS
  Filled 2012-12-16: qty 5

## 2012-12-16 MED ORDER — SODIUM CHLORIDE 0.9 % IJ SOLN
10.0000 mL | INTRAMUSCULAR | Status: DC | PRN
  Administered 2012-12-16: 10 mL via INTRAVENOUS
  Filled 2012-12-16: qty 10

## 2012-12-16 NOTE — Patient Instructions (Signed)
Implanted Port Instructions  An implanted port is a central line that has a round shape and is placed under the skin. It is used for long-term IV (intravenous) access for:  · Medicine.  · Fluids.  · Liquid nutrition, such as TPN (total parenteral nutrition).  · Blood samples.  Ports can be placed:  · In the chest area just below the collarbone (this is the most common place.)  · In the arms.  · In the belly (abdomen) area.  · In the legs.  PARTS OF THE PORT  A port has 2 main parts:  · The reservoir. The reservoir is round, disc-shaped, and will be a small, raised area under your skin.  · The reservoir is the part where a needle is inserted (accessed) to either give medicines or to draw blood.  · The catheter. The catheter is a long, slender tube that extends from the reservoir. The catheter is placed into a large vein.  · Medicine that is inserted into the reservoir goes into the catheter and then into the vein.  INSERTION OF THE PORT  · The port is surgically placed in either an operating room or in a procedural area (interventional radiology).  · Medicine may be given to help you relax during the procedure.  · The skin where the port will be inserted is numbed (local anesthetic).  · 1 or 2 small cuts (incisions) will be made in the skin to insert the port.  · The port can be used after it has been inserted.  INCISION SITE CARE  · The incision site may have small adhesive strips on it. This helps keep the incision site closed. Sometimes, no adhesive strips are placed. Instead of adhesive strips, a special kind of surgical glue is used to keep the incision closed.  · If adhesive strips were placed on the incision sites, do not take them off. They will fall off on their own.  · The incision site may be sore for 1 to 2 days. Pain medicine can help.  · Do not get the incision site wet. Bathe or shower as directed by your caregiver.  · The incision site should heal in 5 to 7 days. A small scar may form after the  incision has healed.  ACCESSING THE PORT  Special steps must be taken to access the port:  · Before the port is accessed, a numbing cream can be placed on the skin. This helps numb the skin over the port site.  · A sterile technique is used to access the port.  · The port is accessed with a needle. Only "non-coring" port needles should be used to access the port. Once the port is accessed, a blood return should be checked. This helps ensure the port is in the vein and is not clogged (clotted).  · If your caregiver believes your port should remain accessed, a clear (transparent) bandage will be placed over the needle site. The bandage and needle will need to be changed every week or as directed by your caregiver.  · Keep the bandage covering the needle clean and dry. Do not get it wet. Follow your caregiver's instructions on how to take a shower or bath when the port is accessed.  · If your port does not need to stay accessed, no bandage is needed over the port.  FLUSHING THE PORT  Flushing the port keeps it from getting clogged. How often the port is flushed depends on:  · If a   constant infusion is running. If a constant infusion is running, the port may not need to be flushed.  · If intermittent medicines are given.  · If the port is not being used.  For intermittent medicines:  · The port will need to be flushed:  · After medicines have been given.  · After blood has been drawn.  · As part of routine maintenance.  · A port is normally flushed with:  · Normal saline.  · Heparin.  · Follow your caregiver's advice on how often, how much, and the type of flush to use on your port.  IMPORTANT PORT INFORMATION  · Tell your caregiver if you are allergic to heparin.  · After your port is placed, you will get a manufacturer's information card. The card has information about your port. Keep this card with you at all times.  · There are many types of ports available. Know what kind of port you have.  · In case of an  emergency, it may be helpful to wear a medical alert bracelet. This can help alert health care workers that you have a port.  · The port can stay in for as long as your caregiver believes it is necessary.  · When it is time for the port to come out, surgery will be done to remove it. The surgery will be similar to how the port was put in.  · If you are in the hospital or clinic:  · Your port will be taken care of and flushed by a nurse.  · If you are at home:  · A home health care nurse may give medicines and take care of the port.  · You or a family member can get special training and directions for giving medicine and taking care of the port at home.  SEEK IMMEDIATE MEDICAL CARE IF:   · Your port does not flush or you are unable to get a blood return.  · New drainage or pus is coming from the incision.  · A bad smell is coming from the incision site.  · You develop swelling or increased redness at the incision site.  · You develop increased swelling or pain at the port site.  · You develop swelling or pain in the surrounding skin near the port.  · You have an oral temperature above 102° F (38.9° C), not controlled by medicine.  MAKE SURE YOU:   · Understand these instructions.  · Will watch your condition.  · Will get help right away if you are not doing well or get worse.  Document Released: 04/29/2005 Document Revised: 07/22/2011 Document Reviewed: 07/21/2008  Exit[employer]® Patient Information ©2014 [employer].

## 2012-12-16 NOTE — Telephone Encounter (Signed)
error 

## 2012-12-17 ENCOUNTER — Telehealth: Payer: Self-pay

## 2012-12-29 ENCOUNTER — Other Ambulatory Visit: Payer: Medicare Other

## 2012-12-30 ENCOUNTER — Other Ambulatory Visit: Payer: Self-pay | Admitting: Hematology & Oncology

## 2012-12-30 ENCOUNTER — Other Ambulatory Visit (INDEPENDENT_AMBULATORY_CARE_PROVIDER_SITE_OTHER): Payer: Medicare Other

## 2012-12-30 ENCOUNTER — Other Ambulatory Visit: Payer: Self-pay | Admitting: Internal Medicine

## 2012-12-30 DIAGNOSIS — R6889 Other general symptoms and signs: Secondary | ICD-10-CM

## 2012-12-30 DIAGNOSIS — R899 Unspecified abnormal finding in specimens from other organs, systems and tissues: Secondary | ICD-10-CM

## 2012-12-30 LAB — BASIC METABOLIC PANEL
Calcium: 9.8 mg/dL (ref 8.4–10.5)
GFR: 73.91 mL/min (ref >60.00)
Glucose, Bld: 78 mg/dL (ref 70–99)
Sodium: 136 mEq/L (ref 135–145)

## 2013-01-01 ENCOUNTER — Other Ambulatory Visit: Payer: Self-pay | Admitting: Internal Medicine

## 2013-01-01 ENCOUNTER — Encounter: Payer: Self-pay | Admitting: Family Medicine

## 2013-01-05 ENCOUNTER — Ambulatory Visit: Payer: Medicare Other | Admitting: Hematology & Oncology

## 2013-01-05 ENCOUNTER — Ambulatory Visit: Payer: Medicare Other

## 2013-01-05 ENCOUNTER — Other Ambulatory Visit: Payer: Medicare Other | Admitting: Lab

## 2013-01-07 NOTE — Telephone Encounter (Signed)
Error. Call recorded in another note.

## 2013-01-14 ENCOUNTER — Other Ambulatory Visit: Payer: Self-pay | Admitting: Internal Medicine

## 2013-01-27 ENCOUNTER — Ambulatory Visit (HOSPITAL_BASED_OUTPATIENT_CLINIC_OR_DEPARTMENT_OTHER): Payer: Medicare Other | Admitting: Hematology & Oncology

## 2013-01-27 ENCOUNTER — Encounter: Payer: Self-pay | Admitting: Hematology & Oncology

## 2013-01-27 ENCOUNTER — Other Ambulatory Visit (HOSPITAL_BASED_OUTPATIENT_CLINIC_OR_DEPARTMENT_OTHER): Payer: Medicare Other | Admitting: Lab

## 2013-01-27 ENCOUNTER — Ambulatory Visit: Payer: Medicare Other

## 2013-01-27 VITALS — BP 116/86 | HR 79 | Temp 97.7°F | Resp 16 | Ht 67.0 in | Wt 230.0 lb

## 2013-01-27 DIAGNOSIS — D649 Anemia, unspecified: Secondary | ICD-10-CM

## 2013-01-27 DIAGNOSIS — C569 Malignant neoplasm of unspecified ovary: Secondary | ICD-10-CM

## 2013-01-27 DIAGNOSIS — Z1509 Genetic susceptibility to other malignant neoplasm: Secondary | ICD-10-CM | POA: Insufficient documentation

## 2013-01-27 DIAGNOSIS — Z1501 Genetic susceptibility to malignant neoplasm of breast: Secondary | ICD-10-CM

## 2013-01-27 DIAGNOSIS — Z1502 Genetic susceptibility to malignant neoplasm of ovary: Secondary | ICD-10-CM

## 2013-01-27 HISTORY — DX: Genetic susceptibility to malignant neoplasm of ovary: Z15.02

## 2013-01-27 HISTORY — DX: Genetic susceptibility to malignant neoplasm of breast: Z15.01

## 2013-01-27 HISTORY — DX: Genetic susceptibility to malignant neoplasm of breast: Z15.09

## 2013-01-27 LAB — CBC WITH DIFFERENTIAL (CANCER CENTER ONLY)
EOS%: 1.3 % (ref 0.0–7.0)
Eosinophils Absolute: 0.1 10*3/uL (ref 0.0–0.5)
MCH: 28.4 pg (ref 26.0–34.0)
MCHC: 32.9 g/dL (ref 32.0–36.0)
MONO%: 7.2 % (ref 0.0–13.0)
NEUT#: 3.2 10*3/uL (ref 1.5–6.5)
Platelets: 207 10*3/uL (ref 145–400)
RBC: 4.83 10*6/uL (ref 3.70–5.32)

## 2013-01-27 LAB — COMPREHENSIVE METABOLIC PANEL
ALT: 19 U/L (ref 0–35)
AST: 21 U/L (ref 0–37)
Alkaline Phosphatase: 101 U/L (ref 39–117)
Sodium: 140 mEq/L (ref 135–145)
Total Bilirubin: 0.4 mg/dL (ref 0.3–1.2)
Total Protein: 6.6 g/dL (ref 6.0–8.3)

## 2013-01-27 LAB — CHCC SATELLITE - SMEAR

## 2013-01-27 NOTE — Progress Notes (Signed)
[name] presented for Portacath access and flush. Proper placement of portacath confirmed by CXR. Portacath located in the right chest wall accessed with  H 20 needle. Clean, Dry and Intact Good blood return present. Portacath flushed with 51m NS and 500U/517mHeparin per protocol and needle removed intact. Procedure without incident. Patient tolerated procedure well.

## 2013-01-27 NOTE — Progress Notes (Signed)
This office note has been dictated.

## 2013-01-28 ENCOUNTER — Telehealth: Payer: Self-pay | Admitting: Hematology & Oncology

## 2013-01-28 LAB — IRON AND TIBC CHCC: %SAT: 40 % (ref 21–57)

## 2013-01-28 NOTE — Telephone Encounter (Signed)
Mailed today

## 2013-02-08 NOTE — Progress Notes (Signed)
This encounter was created in error - please disregard.

## 2013-02-09 ENCOUNTER — Other Ambulatory Visit: Payer: Self-pay | Admitting: Internal Medicine

## 2013-02-09 ENCOUNTER — Other Ambulatory Visit: Payer: Self-pay | Admitting: Hematology & Oncology

## 2013-02-09 NOTE — Progress Notes (Signed)
CC:   Standley Brooking. Regis Bill, MD Larey Seat, M.D.  DIAGNOSES: 1. 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 2. BRCA1 positive. 3. Severe chemotherapy-induced neuropathy. 4. Intermittent iron-deficiency anemia.  CURRENT THERAPY:  Observation.  INTERIM HISTORY:  [name] comes in for followup.  We last saw her back in June.  Since then, she has been having some back issues.  She sees a Restaurant manager, fast food.  This is down in the sacroiliac region.  This has been going on for about a month.  She says that the chiropractor has helped her out a little bit.  She says that it seems to be worse when she does a lot of activities.  It is not worse when she lies down.  There is no pain radiating to her legs.  She has had no cough.  She has had no shortness of breath.  She has had no fever.  She said that she had x-rays done at her chiropractor.  I am not sure what these showed.  I told her that if this pain continues, then we can get her set with an MRI, make sure nothing else is going on in the lumbosacral spine or SI area.  Of note, her last mammogram was done back in February.  This all looked fine.  She may need to have MRI of the breast next time she has the breasts evaluated.  She had MRI of the breast.  It looked fine.  This was also back in February.  PHYSICAL EXAMINATION:  General:  This is a moderately obese African American female in no obvious distress.  Vital Signs:  Temperature of 97.7, pulse 79, respiratory rate 16, blood pressure 116/86.  Weight is 230 pounds.  Head and Neck:  Normocephalic, atraumatic skull.  There are no ocular or oral lesions.  There are no palpable cervical or supraclavicular lymph nodes.  Lungs:  Clear bilaterally.  Cardiac: Regular rate and rhythm with a normal S1, S2.  There are no murmurs, rubs or bruits.  Abdomen:  Soft.  She has a well-healed laparotomy scar. There is no fluid wave.  There is no palpable  hepatosplenomegaly. Extremities:  No clubbing, cyanosis or edema.  She has good range motion of her joints.  She has good strength bilaterally.  Back:  Some slight tenderness in the right sacroiliac region.  There is no tenderness over the spine or ribs.  Skin:  No rashes, ecchymosis, or petechia. Neurological:  No focal neurological deficits.  LABORATORY STUDIES:  White cell count is 5.3, hemoglobin 13.7, hematocrit 41.7, platelet count 207.  IMPRESSION:  [name] is a very charming 52 year old African American female.  She actually does have mixed ethnic parents.  Of note, she is BRCA1.  She had this poorly-differentiated carcinoma in the abdomen.  I suspect that this probably is ovarian.  She was treated with systemic chemotherapy.  She had, I think, 6 cycles of chemotherapy with carboplatin/Taxotere.  She completed this probably about 4 years ago.  There is no recurrence.  She clearly is at risk for breast cancer.  She will have the mammogram, the MRIs in January.  I am not sure what this back pain is all about.  We will see about an MRI if this does not get better.  We will go ahead and plan to get her back probably in about 6 weeks or so.  I spent a good 40 minutes with her today.    ______________________________ [name], M.D. PRE/MEDQ  D:  01/27/2013  T:  02/09/2013  Job:  [id number]

## 2013-03-15 ENCOUNTER — Other Ambulatory Visit: Payer: Self-pay | Admitting: Lab

## 2013-03-15 ENCOUNTER — Ambulatory Visit: Payer: Self-pay | Admitting: Hematology & Oncology

## 2013-03-18 ENCOUNTER — Telehealth: Payer: Self-pay | Admitting: Hematology & Oncology

## 2013-03-18 NOTE — Telephone Encounter (Signed)
Left message for pt to call and schedule appointment

## 2013-04-20 NOTE — Progress Notes (Signed)
DIAGNOSES: 1. Poorly differentiated carcinoma, ovarian in origin, remission. 2. BRCA1 positive. 3. Severe chemotherapy-induced neuropathy. 4. Intermittent iron-deficiency anemia.  CURRENT THERAPY:  Observation.  INTERIM HISTORY:  [name] comes in for followup.  We last saw her back in June.  Since then, she would have some back issues.  She sees a Restaurant manager, fast food.  This is down in the sacroiliac region.  This been going on for about a month.  She says that the chiropractor has helped her out a little bit.  She says that it seems to be worse when she does a lot of activities.  It is not worse when she lies down.  There is no pain radiating to her legs.  She has had no cough present or shortness of breath.  She has had no fever.  She said that she had x-rays done at a chiropractor.  I am not sure what these showed.  Then I told her that if this pain continues, then we can get this over the MRI and make sure nothing else is going on in the lumbosacral spine or SI area.  Of note, her last mammogram was done back in February.  This all looked fine.  She may need an MRI of the breast next time.  She has the breasts evaluated.  She had MRI of the breast.  She looked fine.  This is also back in February.  PHYSICAL EXAMINATION:  On her physical exam, this is a mildly obese African American female in no obvious distress.  Vital signs: Temperature of 97.7, pulse 79, respiratory rate 16, blood pressure 116/86, and weight is 230 pounds.  Head and Neck:  Normocephalic, atraumatic skull.  There are no ocular or oral lesions.  There are no palpable cervical or supraclavicular lymph nodes.  Lungs:  Clear bilaterally.  Cardiac:  Regular rate and rhythm with a normal S1, S2. There are no murmurs, rubs or bruits.  Abdomen:  Soft.  She has well- healed laparotomy scar.  There is no fluid wave.  There is no palpable hepatosplenomegaly.  Extremities:  Shows no clubbing, cyanosis or edema. She has  good range motion of her joints.  She has good strength bilaterally.  Back:  Shows some slight tenderness in the right sacroiliac region.  There is no tenderness over the spine or ribs. Skin:  No rashes, ecchymosis, or petechia.  Neurological:  Shows no focal neurological deficits.  LABORATORY STUDIES:  White cell count is 5.3, hemoglobin 13.7, hematocrit 41.7, and platelet count 207.  IMPRESSION:  The  patient is a [place].  She is a very charming 52- year-old [place] American female.  She actually think does have mixed ethnic parents.  Of note, she is BRCA1.  She had this poorly-differentiated carcinoma in the abdomen.  I suspect that this probably is ovarian.  She was treated with systemic chemotherapy.  She had 36 cycles of chemotherapy with carboplatin/Taxotere.  She completed this 5/4 years ago.  There is no recurrence.  She clearly is at risk for breast cancer.  She will have the mammogram or the MRIs in January.  I am not sure what this back pain is all about.  We will see about MRI, if this does not get better.  We will go ahead and plan to give her back now in about 6 weeks or so.  I spent a good 40 minutes with her today.    ______________________________ [name], M.D. PRE/MEDQ  D:  01/27/2013  T:  04/18/2013  Job:  [id number]

## 2013-04-21 ENCOUNTER — Encounter: Payer: Self-pay | Admitting: Internal Medicine

## 2013-04-21 ENCOUNTER — Ambulatory Visit (INDEPENDENT_AMBULATORY_CARE_PROVIDER_SITE_OTHER): Payer: Medicare Other | Admitting: Internal Medicine

## 2013-04-21 DIAGNOSIS — R4189 Other symptoms and signs involving cognitive functions and awareness: Secondary | ICD-10-CM

## 2013-04-21 DIAGNOSIS — F09 Unspecified mental disorder due to known physiological condition: Secondary | ICD-10-CM

## 2013-04-21 DIAGNOSIS — F4323 Adjustment disorder with mixed anxiety and depressed mood: Secondary | ICD-10-CM

## 2013-04-21 DIAGNOSIS — M5430 Sciatica, unspecified side: Secondary | ICD-10-CM

## 2013-04-21 DIAGNOSIS — C801 Malignant (primary) neoplasm, unspecified: Secondary | ICD-10-CM

## 2013-04-21 DIAGNOSIS — G622 Polyneuropathy due to other toxic agents: Secondary | ICD-10-CM

## 2013-04-21 DIAGNOSIS — Z8543 Personal history of malignant neoplasm of ovary: Secondary | ICD-10-CM

## 2013-04-21 DIAGNOSIS — M5440 Lumbago with sciatica, unspecified side: Secondary | ICD-10-CM | POA: Insufficient documentation

## 2013-04-21 DIAGNOSIS — I10 Essential (primary) hypertension: Secondary | ICD-10-CM

## 2013-04-21 NOTE — Patient Instructions (Signed)
Glad  you are doing better  But agree we need to get the back evaluated. Will do a referral   To [facility] as you have seen them in past. Labs have been good .  Preventive visit with labs in 6 months .

## 2013-04-21 NOTE — Progress Notes (Signed)
Chief Complaint  Patient presents with  . Follow-up    HPI: Patient comes in today for follow up of  multiple medical problems.  Back pain got better and then again. Bad again  . ? Saw in past.  [facility]. Now has been going on for over a month. Radiates down her left leg mostly is in the low spine area no associated fever has neuropathy and doesn't notice any weakness or different numbness. No falling. Overall since last visit she's actually doing fairly well otherwise. Dr. of her told her she should probably get an MRI of her spine because of recurrent back problem and her history of cancer. She is now on disability mostly neurologic symptoms. She is seen Dr. Keturah Barre and her sleep medications have been decreased but she is now on 12.5 Ambien to help her maintain her sleep Her last laboratory studies were good she is on potassium. Blood pressure is being controlled.  Now persistent. ROS: See pertinent positives and negatives per HPI.no cp sob   Past Medical History  Diagnosis Date  . Hx pulmonary embolism   . Obesity   . Metrorrhagia   . Allergic rhinitis   . Iron deficiency anemia   . Hypertension   . Alopecia   . Vitamin D deficiency   . HLD (hyperlipidemia)   . Adjustment disorder   . Anxiety and depression   . Raynaud's syndrome   . Right groin mass   . 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 Squamous cell carcinoma of skin   . Ovarian cancer 48  . Vision abnormalities   . Neuropathy   . 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    Family History  Problem Relation Age of Onset  . Pulmonary embolism Mother   . Hypertension Father   . Breast cancer Maternal Grandmother     diagnosed in early 81s  . Hyperlipidemia      History   Social History  . Marital Status: Divorced    Spouse Name: N/A    Number of Children: 1  . Years of Education: grad sch.   Occupational History  .      disabled    Social History Main Topics  . Smoking status: Current Every Day Smoker -- 1.00 packs/day for 37 years    Types: Cigarettes  . Smokeless tobacco: Never Used     Comment: 1/2 pack daily  . Alcohol Use: Yes     Comment: rare, 3 weekly  . Drug Use: No  . Sexual Activity: None   Other Topics Concern  . None   Social History Narrative   Divorced   1 son /1 adopted adult son   Sales occupation on disability for now   Regular exercise-yes money stress   Daily caffeine use 2-3   On since a security disability since 2012    Outpatient Encounter Prescriptions as of 04/21/2013  Medication Sig  . donepezil (ARICEPT) 10 MG tablet Take 10 mg by mouth daily.  . DULoxetine (CYMBALTA) 60 MG capsule TAKE 1 CAPSULE BY MOUTH DAILY  . ergocalciferol (VITAMIN D2) 50000 UNITS capsule Take 50,000 Units by mouth once a week. Takes on saturday  . Fish Oil-Cholecalciferol (FISH OIL + D3 PO) Take 1 capsule by mouth daily.   Marland Kitchen KLOR-CON 10 10 MEQ tablet TAKE 2 TABLETS BY MOUTH ONCE DAILY  . LYRICA 150 MG capsule TAKE ONE CAPSULE BY MOUTH TWICE DAILY  .  Multiple Vitamins-Minerals (MULTIVITAL PO) Take 1 tablet by mouth daily.   . orphenadrine (NORFLEX) 100 MG tablet Take 100 mg by mouth 2 (two) times daily.  . simvastatin (ZOCOR) 40 MG tablet TAKE 1 TABLET BY MOUTH EVERY DAY  . triamterene-hydrochlorothiazide (MAXZIDE-25) 37.5-25 MG per tablet Take 2 tablets by mouth daily.  Marland Kitchen zolpidem (AMBIEN CR) 12.5 MG CR tablet Take 1 tablet (12.5 mg total) by mouth at bedtime as needed for sleep.  . [DISCONTINUED] DULoxetine (CYMBALTA) 60 MG capsule Take 60 mg by mouth daily.  . [DISCONTINUED] simvastatin (ZOCOR) 40 MG tablet Take 40 mg by mouth every evening.    EXAM:  BP 122/82  Temp(Src) 97.9 F (36.6 C) (Oral)  Wt 236 lb (107.049 kg)  Body mass index is 36.95 kg/(m^2).  GENERAL: vitals reviewed and listed above, alert, oriented, appears well hydrated and in no acute distress looks well today  HEENT:  atraumatic, conjunctiva  clear, no obvious abnormalities on inspection of external nose and ears OP : no lesion edema or exudate  NECK: no obvious masses on inspection palpation no adenopathy LUNGS: clear to auscultation bilaterally, no wheezes, rales or rhonchi, good air movement CV: HRRR, no clubbing cyanosis or  peripheral edema nl cap refill  MS: moves all extremities  Disc low ls area  Neg slr?   Motor ? Ok   dts hard to elicit  PSYCH: pleasant and cooperative, no obvious depression or anxiety Lab Results  Component Value Date   WBC 5.3 01/27/2013   HGB 13.7 01/27/2013   HCT 41.7 01/27/2013   PLT 207 01/27/2013   GLUCOSE 72 01/27/2013   CHOL 133 10/15/2012   TRIG 85.0 10/15/2012   HDL 39.90 10/15/2012   LDLDIRECT 98.7 09/04/2009   LDLCALC 76 10/15/2012   ALT 19 01/27/2013   AST 21 01/27/2013   NA 140 01/27/2013   K 3.6 01/27/2013   CL 103 01/27/2013   CREATININE 0.96 01/27/2013   BUN 23 01/27/2013   CO2 30 01/27/2013   TSH 2.70 10/15/2012   INR 3.1 11/28/2010    ASSESSMENT AND PLAN:  Discussed the following assessment and plan:  Low back pain with sciatica, left - persistent   - Plan: Ambulatory referral to Orthopedic Surgery  NEOPLASM, MALIGNANT, UNKNOWN PRIMARY - Plan: Ambulatory referral to Orthopedic Surgery  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  - poss unknown promary  -Patient advised to return or notify health care team  if symptoms worsen or persist or new concerns arise.  Patient Instructions  Glad  you are doing better  But agree we need to get the back evaluated. Will do a referral   To [facility] as you have seen them in past. Labs have been good .  Preventive visit with labs in 6 months .     Standley Brooking. Panosh M.D.  Pre visit review using our clinic review tool, if applicable. No additional management  support is needed unless otherwise documented below in the visit note.

## 2013-04-22 ENCOUNTER — Other Ambulatory Visit: Payer: Self-pay | Admitting: Internal Medicine

## 2013-04-22 ENCOUNTER — Other Ambulatory Visit: Payer: Self-pay | Admitting: Hematology & Oncology

## 2013-05-03 NOTE — Telephone Encounter (Signed)
Dr Maureen Chatters originally rxed this med  can fill disp 60  Change sig to prn muscle spasm ;no refill .  We have sent in a referral for further evaluation of her sciatica pains.

## 2013-05-19 ENCOUNTER — Telehealth: Payer: Self-pay | Admitting: Hematology & Oncology

## 2013-05-19 ENCOUNTER — Other Ambulatory Visit: Payer: Self-pay | Admitting: Internal Medicine

## 2013-05-19 ENCOUNTER — Other Ambulatory Visit: Payer: Self-pay | Admitting: Hematology & Oncology

## 2013-05-19 NOTE — Telephone Encounter (Signed)
Pt made 1-12 flush at [facility] and 2-4 MD

## 2013-05-21 NOTE — Telephone Encounter (Signed)
Last filled on 04/22/2013 #60 with 0 additional refills

## 2013-05-24 ENCOUNTER — Other Ambulatory Visit: Payer: Self-pay

## 2013-05-24 NOTE — Telephone Encounter (Signed)
Please change sig to bid prn  Disp 60 refill x 1

## 2013-05-27 ENCOUNTER — Other Ambulatory Visit: Payer: Self-pay | Admitting: *Deleted

## 2013-05-27 ENCOUNTER — Other Ambulatory Visit: Payer: Self-pay | Admitting: Oncology

## 2013-05-27 ENCOUNTER — Ambulatory Visit (HOSPITAL_BASED_OUTPATIENT_CLINIC_OR_DEPARTMENT_OTHER): Payer: Medicare Other

## 2013-05-27 ENCOUNTER — Encounter: Payer: Self-pay | Admitting: *Deleted

## 2013-05-27 ENCOUNTER — Encounter (INDEPENDENT_AMBULATORY_CARE_PROVIDER_SITE_OTHER): Payer: Self-pay

## 2013-05-27 ENCOUNTER — Ambulatory Visit (HOSPITAL_COMMUNITY)
Admission: RE | Admit: 2013-05-27 | Discharge: 2013-05-27 | Disposition: A | Discharge status: Home / Self Care | Payer: Medicare Other | Source: Ambulatory Visit | Attending: Oncology | Admitting: Oncology

## 2013-05-27 VITALS — BP 127/90 | HR 89 | Temp 98.2°F

## 2013-05-27 DIAGNOSIS — Z95828 Presence of other vascular implants and grafts: Secondary | ICD-10-CM

## 2013-05-27 DIAGNOSIS — C801 Malignant (primary) neoplasm, unspecified: Secondary | ICD-10-CM | POA: Insufficient documentation

## 2013-05-27 DIAGNOSIS — C569 Malignant neoplasm of unspecified ovary: Secondary | ICD-10-CM

## 2013-05-27 DIAGNOSIS — Z452 Encounter for adjustment and management of vascular access device: Secondary | ICD-10-CM

## 2013-05-27 DIAGNOSIS — Y849 Medical procedure, unspecified as the cause of abnormal reaction of the patient, or of later complication, without mention of misadventure at the time of the procedure: Secondary | ICD-10-CM | POA: Insufficient documentation

## 2013-05-27 DIAGNOSIS — T82598A Other mechanical complication of other cardiac and vascular devices and implants, initial encounter: Secondary | ICD-10-CM | POA: Insufficient documentation

## 2013-05-27 MED ORDER — HEPARIN SOD (PORK) LOCK FLUSH 100 UNIT/ML IV SOLN
500.0000 [IU] | Freq: Once | INTRAVENOUS | Status: AC
  Administered 2013-05-27: 500 [IU] via INTRAVENOUS
  Filled 2013-05-27: qty 5

## 2013-05-27 MED ORDER — IOHEXOL 300 MG/ML  SOLN: 10.0000 mL | Freq: Once | INTRAMUSCULAR | Status: AC | PRN

## 2013-05-27 MED ORDER — SODIUM CHLORIDE 0.9 % IJ SOLN
10.0000 mL | INTRAMUSCULAR | Status: DC | PRN
  Administered 2013-05-27: 10 mL via INTRAVENOUS
  Filled 2013-05-27: qty 10

## 2013-05-27 NOTE — Patient Instructions (Signed)

## 2013-05-27 NOTE — Progress Notes (Signed)
Patient in for port flush today. Able to flush port, but no blood return noted with port flush. Verified no blood return with Hinda Lenis, RN.

## 2013-05-27 NOTE — Progress Notes (Signed)
This RN was requested by LPN in flush room to assess per patient's statement of discomfort with port flush.  This RN assessed site including being able to feel port under skin with needle intact in center of site. Site was nontender, without redness or swelling.  This RN pushed 1-2 cc NS without pt stating complaint, unable to obtain blood return despite repositioning pt as well as requesting deep breaths and coughing.  This RN then proceeded to push 3 cc of NS at more rapid flush as usual protoccol with pt immediately stating discomfort in right outer neck area.  No swelling noted at discomfort site but pt verbalized discomfort as " fullness ".  No further flush attempted.  Site dressed per sterile protoccol and post communication with Dr Antonieta Pert RN - pt sent for a dye study.

## 2013-05-28 ENCOUNTER — Other Ambulatory Visit: Payer: Self-pay | Admitting: Radiology

## 2013-05-28 ENCOUNTER — Other Ambulatory Visit: Payer: Self-pay | Admitting: Oncology

## 2013-05-28 DIAGNOSIS — Z95828 Presence of other vascular implants and grafts: Secondary | ICD-10-CM

## 2013-05-28 DIAGNOSIS — C801 Malignant (primary) neoplasm, unspecified: Secondary | ICD-10-CM

## 2013-06-01 ENCOUNTER — Encounter (HOSPITAL_COMMUNITY): Payer: Self-pay | Admitting: Pharmacy Technician

## 2013-06-02 ENCOUNTER — Other Ambulatory Visit: Payer: Self-pay | Admitting: Oncology

## 2013-06-02 ENCOUNTER — Ambulatory Visit (HOSPITAL_COMMUNITY)
Admission: RE | Admit: 2013-06-02 | Discharge: 2013-06-02 | Disposition: A | Discharge status: Home / Self Care | Payer: Medicare Other | Source: Ambulatory Visit | Attending: Oncology | Admitting: Oncology

## 2013-06-02 ENCOUNTER — Other Ambulatory Visit (HOSPITAL_COMMUNITY): Payer: Self-pay

## 2013-06-02 ENCOUNTER — Encounter (HOSPITAL_COMMUNITY): Payer: Self-pay

## 2013-06-02 ENCOUNTER — Telehealth: Payer: Self-pay | Admitting: *Deleted

## 2013-06-02 VITALS — BP 111/75 | HR 84 | Temp 97.0°F | Resp 16

## 2013-06-02 DIAGNOSIS — Z79899 Other long term (current) drug therapy: Secondary | ICD-10-CM | POA: Insufficient documentation

## 2013-06-02 DIAGNOSIS — F17200 Nicotine dependence, unspecified, uncomplicated: Secondary | ICD-10-CM | POA: Insufficient documentation

## 2013-06-02 DIAGNOSIS — Z90710 Acquired absence of both cervix and uterus: Secondary | ICD-10-CM | POA: Insufficient documentation

## 2013-06-02 DIAGNOSIS — Y849 Medical procedure, unspecified as the cause of abnormal reaction of the patient, or of later complication, without mention of misadventure at the time of the procedure: Secondary | ICD-10-CM | POA: Insufficient documentation

## 2013-06-02 DIAGNOSIS — Z1239 Encounter for other screening for malignant neoplasm of breast: Secondary | ICD-10-CM

## 2013-06-02 DIAGNOSIS — E669 Obesity, unspecified: Secondary | ICD-10-CM | POA: Insufficient documentation

## 2013-06-02 DIAGNOSIS — C801 Malignant (primary) neoplasm, unspecified: Secondary | ICD-10-CM

## 2013-06-02 DIAGNOSIS — Z95828 Presence of other vascular implants and grafts: Secondary | ICD-10-CM

## 2013-06-02 DIAGNOSIS — E785 Hyperlipidemia, unspecified: Secondary | ICD-10-CM | POA: Insufficient documentation

## 2013-06-02 DIAGNOSIS — D509 Iron deficiency anemia, unspecified: Secondary | ICD-10-CM | POA: Insufficient documentation

## 2013-06-02 DIAGNOSIS — F3289 Other specified depressive episodes: Secondary | ICD-10-CM | POA: Insufficient documentation

## 2013-06-02 DIAGNOSIS — Z9884 Bariatric surgery status: Secondary | ICD-10-CM | POA: Insufficient documentation

## 2013-06-02 DIAGNOSIS — Z9079 Acquired absence of other genital organ(s): Secondary | ICD-10-CM | POA: Insufficient documentation

## 2013-06-02 DIAGNOSIS — F329 Major depressive disorder, single episode, unspecified: Secondary | ICD-10-CM | POA: Insufficient documentation

## 2013-06-02 DIAGNOSIS — I10 Essential (primary) hypertension: Secondary | ICD-10-CM | POA: Insufficient documentation

## 2013-06-02 DIAGNOSIS — Z86711 Personal history of pulmonary embolism: Secondary | ICD-10-CM | POA: Insufficient documentation

## 2013-06-02 DIAGNOSIS — T82898A Other specified complication of vascular prosthetic devices, implants and grafts, initial encounter: Secondary | ICD-10-CM | POA: Insufficient documentation

## 2013-06-02 DIAGNOSIS — F411 Generalized anxiety disorder: Secondary | ICD-10-CM | POA: Insufficient documentation

## 2013-06-02 LAB — CBC
HEMATOCRIT: 41 % (ref 36.0–46.0)
HEMOGLOBIN: 13.5 g/dL (ref 12.0–15.0)
MCH: 29 pg (ref 26.0–34.0)
MCHC: 32.9 g/dL (ref 30.0–36.0)
MCV: 88.2 fL (ref 78.0–100.0)
Platelets: 187 10*3/uL (ref 150–400)
RBC: 4.65 MIL/uL (ref 3.87–5.11)
RDW: 14.7 % (ref 11.5–15.5)
WBC: 4.5 10*3/uL (ref 4.0–10.5)

## 2013-06-02 LAB — APTT: APTT: 28 s (ref 24–37)

## 2013-06-02 LAB — PROTIME-INR
INR: 0.86 (ref 0.00–1.49)
Prothrombin Time: 11.6 seconds (ref 11.6–15.2)

## 2013-06-02 MED ORDER — SODIUM CHLORIDE 0.9 % IV SOLN
INTRAVENOUS | Status: DC
  Administered 2013-06-02: 13:00:00 via INTRAVENOUS

## 2013-06-02 MED ORDER — MIDAZOLAM HCL 2 MG/2ML IJ SOLN
INTRAMUSCULAR | Status: AC
  Filled 2013-06-02: qty 6

## 2013-06-02 MED ORDER — MIDAZOLAM HCL 2 MG/2ML IJ SOLN
INTRAMUSCULAR | Status: AC | PRN
  Administered 2013-06-02: 1 mg via INTRAVENOUS
  Administered 2013-06-02 (×2): 0.5 mg via INTRAVENOUS
  Administered 2013-06-02 (×4): 1 mg via INTRAVENOUS

## 2013-06-02 MED ORDER — CEFAZOLIN SODIUM-DEXTROSE 2-3 GM-% IV SOLR: 2.0000 g | INTRAVENOUS | Status: DC

## 2013-06-02 MED ORDER — DIPHENHYDRAMINE HCL 50 MG/ML IJ SOLN
INTRAMUSCULAR | Status: AC | PRN
  Administered 2013-06-02: 25 mg via INTRAVENOUS

## 2013-06-02 MED ORDER — LIDOCAINE-EPINEPHRINE (PF) 2 %-1:200000 IJ SOLN
INTRAMUSCULAR | Status: AC
  Filled 2013-06-02: qty 20

## 2013-06-02 MED ORDER — LIDOCAINE HCL 1 % IJ SOLN
INTRAMUSCULAR | Status: AC
  Filled 2013-06-02: qty 20

## 2013-06-02 MED ORDER — HEPARIN SOD (PORK) LOCK FLUSH 100 UNIT/ML IV SOLN
INTRAVENOUS | Status: AC | PRN
  Administered 2013-06-02: 500 [IU]

## 2013-06-02 MED ORDER — IOHEXOL 300 MG/ML  SOLN
INTRAMUSCULAR | Status: AC | PRN
  Administered 2013-06-02: 1 mL

## 2013-06-02 MED ORDER — FAMOTIDINE IN NACL 20-0.9 MG/50ML-% IV SOLN
20.0000 mg | Freq: Once | INTRAVENOUS | Status: AC
  Administered 2013-06-02: 20 mg via INTRAVENOUS
  Filled 2013-06-02: qty 50

## 2013-06-02 MED ORDER — DIPHENHYDRAMINE HCL 50 MG/ML IJ SOLN
INTRAMUSCULAR | Status: AC
  Filled 2013-06-02: qty 1

## 2013-06-02 MED ORDER — FENTANYL CITRATE 0.05 MG/ML IJ SOLN
INTRAMUSCULAR | Status: AC | PRN
  Administered 2013-06-02 (×4): 50 ug via INTRAVENOUS

## 2013-06-02 MED ORDER — FENTANYL CITRATE 0.05 MG/ML IJ SOLN
INTRAMUSCULAR | Status: AC
  Filled 2013-06-02: qty 6

## 2013-06-02 NOTE — ED Notes (Addendum)
Bilateral 7Fr femoral venous sheaths removed using manual pressure.  Both groins level 0.

## 2013-06-02 NOTE — Procedures (Signed)
Contrast injection confirms persistent intravascular positioning of right IJ approach port-a-catheter with tip likely within an occluded right external jugular vein. Success fluoroscopic guided repositioning of port-a-cath with tip now terminating within the distal SVC. No immediate post procedural complications. The port is ready for immediate use.

## 2013-06-02 NOTE — Discharge Instructions (Signed)
Moderate Sedation, Adult Moderate sedation is given to help you relax or even sleep through a procedure. You may remain sleepy, be clumsy, or have poor balance for several hours following this procedure. Arrange for a responsible adult, family member, or friend to take you home. A responsible adult should stay with you for at least 24 hours or until the medicines have worn off.  Do not participate in any activities where you could become injured for the next 24 hours, or until you feel normal again. Do not:  Drive.  Swim.  Ride a bicycle.  Operate heavy machinery.  Cook.  Use power tools.  Climb ladders.  Work at [employer].  Do not make important decisions or sign legal documents until you are improved.  Vomiting may occur if you eat too soon. When you can drink without vomiting, try water, juice, or soup. Try solid foods if you feel little or no nausea.  Only take over-the-counter or prescription medications for pain, discomfort, or fever as directed by your caregiver.If pain medications have been prescribed for you, ask your caregiver how soon it is safe to take them.  Make sure you and your family fully understands everything about the medication given to you. Make sure you understand what side effects may occur.  You should not drink alcohol, take sleeping pills, or medications that cause drowsiness for at least 24 hours.  If you smoke, do not smoke alone.  If you are feeling better, you may resume normal activities 24 hours after receiving sedation.  Keep all appointments as scheduled. Follow all instructions.  Ask questions if you do not understand. SEEK MEDICAL CARE IF:   Your skin is pale or bluish in color.  You continue to feel sick to your stomach (nauseous) or throw up (vomit).  Your pain is getting worse and not helped by medication.  You have bleeding or swelling.  You are still sleepy or feeling clumsy after 24 hours. SEEK IMMEDIATE MEDICAL CARE IF:    You develop a rash.  You have difficulty breathing.  You develop any type of allergic problem.  You have a fever. Document Released: 01/22/2001 Document Revised: 07/22/2011 Document Reviewed: 01/04/2013 [facility] Patient Information 2014 [place]. Venogram, Care After Refer to this sheet in the next few weeks. These instructions provide you with information on caring for yourself after your procedure. Your health care provider may also give you more specific instructions. Your treatment has been planned according to current medical practices, but problems sometimes occur. Call your health care provider if you have any problems or questions after your procedure. WHAT TO EXPECT AFTER THE PROCEDURE After your procedure, it is typical to have the following sensations:  Mild discomfort at the catheter insertion site. HOME CARE INSTRUCTIONS   Take all medicines exactly as directed.  Follow any prescribed diet.  Follow instructions regarding both rest and physical activity.  Drink more fluids for the first several days after the procedure, in order to help flush dye from your kidneys. SEEK MEDICAL CARE IF:  You develop a rash. SEEK IMMEDIATE MEDICAL CARE IF  You have fever not controlled by medicine.  There is pain, drainage, bleeding, redness, swelling, warmth or a red streak at the site of the IV tube.  The extremity where your IV tube was placed becomes discolored, numb, or cool.  You have difficulty breathing or shortness of breath.  You develop chest pain.  You have excessive dizziness or fainting. Document Released: 02/17/2013 Document Reviewed: 01/04/2013 [employer]  Patient Information 2014 [place].

## 2013-06-02 NOTE — Telephone Encounter (Signed)
Called [name] for Dr. Marin Olp.  Dr. Marin Olp wanted to know if patient wanted to remove port or put a new one in.  Patient is on her way to IR to attempt to have port repaired.  Told patient to call us and let us know the status of the repair

## 2013-06-02 NOTE — H&P (Signed)
[name] is an 53 y.o. female.   Chief Complaint: malpositioned/poorly functioning port a cath HPI: Patient with history of poorly differentiated carcinoma of unknown primary/iron deficiency anemia and rt chest wall PAC placed approximately 5 years ago.The port is primarily used at this time for biannual iron infusions. There has been recent difficulty with aspirating blood /accessing/flushing port with associated pain at site. Pt presents today for port a cath injection with possible repositioning or removal.  Past Medical History  Diagnosis Date  . Hx pulmonary embolism   . Obesity   . Metrorrhagia   . Allergic rhinitis   . Iron deficiency anemia   . Hypertension   . Alopecia   . Vitamin D deficiency   . HLD (hyperlipidemia)   . Adjustment disorder   . Anxiety and depression   . Raynaud's syndrome   . Right groin mass   . 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 Squamous cell carcinoma of skin   . Ovarian cancer 48  . Vision abnormalities   . Neuropathy   . 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    Past Surgical History  Procedure Laterality Date  . Breast reduction surgery  2000  . Gastric bypass  1979  . Tonsillectomy    . Cesarean section      1991  . Abdominal hysterectomy  05/2008    TAH/BSO  . Colonoscopy  2004; 12/07/10    hemorrhoids    Family History  Problem Relation Age of Onset  . Pulmonary embolism Mother   . Hypertension Father   . Breast cancer Maternal Grandmother     diagnosed in early 40s  . Hyperlipidemia     Social History:  reports that she has been smoking Cigarettes.  She has a 37 pack-year smoking history. She has never used smokeless tobacco. She reports that she drinks alcohol. She reports that she does not use illicit drugs.  Allergies:  Allergies  Allergen Reactions  . Codeine     REACTION: Nausea  . Orange Itching and Rash    Current outpatient  prescriptions:Carboxymethylcellulose Sodium (REFRESH LIQUIGEL OP), Apply 1 drop to eye 2 (two) times daily., Disp: , Rfl: ;  donepezil (ARICEPT) 10 MG tablet, Take 10 mg by mouth every morning. , Disp: , Rfl: ;  ketotifen (ZADITOR) 0.025 % ophthalmic solution, Place 1 drop into both eyes 2 (two) times daily., Disp: , Rfl: ;  Multiple Vitamin (MULTIVITAMIN WITH MINERALS) TABS tablet, Take 1 tablet by mouth daily., Disp: , Rfl:  omega-3 acid ethyl esters (LOVAZA) 1 G capsule, Take 1 g by mouth daily., Disp: , Rfl: ;  orphenadrine (NORFLEX) 100 MG tablet, Take 100 mg by mouth 2 (two) times daily., Disp: , Rfl: ;  potassium chloride (K-DUR,KLOR-CON) 10 MEQ tablet, Take 20 mEq by mouth every morning., Disp: , Rfl: ;  pregabalin (LYRICA) 150 MG capsule, Take 150 mg by mouth 2 (two) times daily., Disp: , Rfl:  simvastatin (ZOCOR) 40 MG tablet, Take 40 mg by mouth every evening., Disp: , Rfl: ;  triamterene-hydrochlorothiazide (MAXZIDE-25) 37.5-25 MG per tablet, Take 2 tablets by mouth every morning. , Disp: , Rfl: ;  Vitamin D, Ergocalciferol, (DRISDOL) 50000 UNITS CAPS capsule, Take 50,000 Units by mouth every 7 (seven) days., Disp: , Rfl: ;  zolpidem (AMBIEN CR) 12.5 MG CR tablet, Take 12.5 mg by mouth at bedtime as needed for sleep., Disp: , Rfl:  Current facility-administered medications:fentaNYL (SUBLIMAZE)  0.05 MG/ML injection, , , , ;  lidocaine (XYLOCAINE) 1 % (with pres) injection, , , , ;  lidocaine-EPINEPHrine (XYLOCAINE W/EPI) 2 %-1:200000 (PF) injection, , , , ;  midazolam (VERSED) 2 MG/2ML injection, , , ,  Facility-Administered Medications Ordered in Other Encounters: 0.9 %  sodium chloride infusion, , Intravenous, Continuous, D Kevin Allred, PA-C, Last Rate: 20 mL/hr at 06/02/13 1237;  ceFAZolin (ANCEF) IVPB 2 g/50 mL premix, 2 g, Intravenous, On Call, D Rowe Robert, PA-C   Results for orders placed during the hospital encounter of 06/02/13 (from the past 48 hour(s))  APTT     Status: None    Collection Time    06/02/13 12:35 PM      Result Value Range   aPTT 28  24 - 37 seconds  CBC     Status: None   Collection Time    06/02/13 12:35 PM      Result Value Range   WBC 4.5  4.0 - 10.5 K/uL   RBC 4.65  3.87 - 5.11 MIL/uL   Hemoglobin 13.5  12.0 - 15.0 g/dL   HCT 41.0  36.0 - 46.0 %   MCV 88.2  78.0 - 100.0 fL   MCH 29.0  26.0 - 34.0 pg   MCHC 32.9  30.0 - 36.0 g/dL   RDW 14.7  11.5 - 15.5 %   Platelets 187  150 - 400 K/uL  PROTIME-INR     Status: None   Collection Time    06/02/13 12:35 PM      Result Value Range   Prothrombin Time 11.6  11.6 - 15.2 seconds   INR 0.86  0.00 - 1.49   No results found.  Review of Systems  Constitutional: Negative for fever and chills.  Respiratory: Negative for cough and shortness of breath.   Cardiovascular: Negative for chest pain.  Gastrointestinal: Negative for nausea, vomiting and abdominal pain.  Musculoskeletal: Negative for back pain.  Neurological: Negative for headaches.  Endo/Heme/Allergies: Does not bruise/bleed easily.    Blood pressure 123/102, pulse 99, temperature 97.1 F (36.2 C), temperature source Oral, resp. rate 16, SpO2 97.00%. Physical Exam  Constitutional: She is oriented to person, place, and time. She appears well-developed and well-nourished.  Cardiovascular: Normal rate and regular rhythm.   Respiratory: Effort normal and breath sounds normal.  Rt chest wall port in place , site mildly tender; recent imaging shows cath tip near right clavicle  GI: Soft. Bowel sounds are normal. There is no tenderness.  obese  Musculoskeletal: Normal range of motion. She exhibits no edema.  Neurological: She is alert and oriented to person, place, and time.     Assessment/Plan Patient with history of poorly differentiated carcinoma of unknown primary/iron deficiency anemia and rt chest wall PAC placed approximately 5 years ago.The port is primarily used at this time for biannual iron infusions. There has been recent  difficulty with aspirating blood /accessing/flushing port with associated pain at site. Pt presents today for port a cath injection with possible repositioning or removal. Details/risks of procedure d/w pt with her understanding and consent.     ALLRED,D KEVIN 06/02/2013, 1:22 PM

## 2013-06-02 NOTE — Progress Notes (Signed)
Patient moving intermittantly. Having to be reminded not to get or sit up

## 2013-06-09 ENCOUNTER — Other Ambulatory Visit: Payer: Self-pay | Admitting: Hematology & Oncology

## 2013-06-11 ENCOUNTER — Other Ambulatory Visit (HOSPITAL_COMMUNITY): Payer: Self-pay | Admitting: Interventional Radiology

## 2013-06-11 ENCOUNTER — Ambulatory Visit (HOSPITAL_COMMUNITY)
Admission: RE | Admit: 2013-06-11 | Discharge: 2013-06-11 | Disposition: A | Discharge status: Home / Self Care | Payer: Medicare Other | Source: Ambulatory Visit | Attending: Interventional Radiology | Admitting: Interventional Radiology

## 2013-06-11 DIAGNOSIS — C801 Malignant (primary) neoplasm, unspecified: Secondary | ICD-10-CM

## 2013-06-11 DIAGNOSIS — M542 Cervicalgia: Secondary | ICD-10-CM | POA: Insufficient documentation

## 2013-06-11 MED ORDER — IOHEXOL 300 MG/ML  SOLN
10.0000 mL | Freq: Once | INTRAMUSCULAR | Status: AC | PRN
  Administered 2013-06-11: 10 mL via INTRAVENOUS

## 2013-06-11 MED ORDER — HEPARIN SOD (PORK) LOCK FLUSH 100 UNIT/ML IV SOLN
500.0000 [IU] | Freq: Once | INTRAVENOUS | Status: AC
  Administered 2013-06-11: 500 [IU]

## 2013-06-11 MED ORDER — HEPARIN SOD (PORK) LOCK FLUSH 100 UNIT/ML IV SOLN
INTRAVENOUS | Status: AC
  Administered 2013-06-11: 500 [IU]
  Filled 2013-06-11: qty 5

## 2013-06-11 NOTE — Procedures (Signed)
The right internal jugular port-a-catheter is appropriately positioned and functioning. The port-a-catheter is ready for immediate use.

## 2013-06-16 ENCOUNTER — Ambulatory Visit (HOSPITAL_BASED_OUTPATIENT_CLINIC_OR_DEPARTMENT_OTHER): Payer: Medicare Other | Admitting: Hematology & Oncology

## 2013-06-16 ENCOUNTER — Encounter: Payer: Self-pay | Admitting: Hematology & Oncology

## 2013-06-16 ENCOUNTER — Other Ambulatory Visit (HOSPITAL_BASED_OUTPATIENT_CLINIC_OR_DEPARTMENT_OTHER): Payer: Medicare Other | Admitting: Lab

## 2013-06-16 ENCOUNTER — Other Ambulatory Visit: Payer: Self-pay | Admitting: Nurse Practitioner

## 2013-06-16 ENCOUNTER — Ambulatory Visit: Payer: Self-pay

## 2013-06-16 VITALS — BP 124/99 | HR 91 | Temp 98.0°F | Resp 14 | Ht 67.0 in | Wt 239.0 lb

## 2013-06-16 DIAGNOSIS — D649 Anemia, unspecified: Secondary | ICD-10-CM

## 2013-06-16 DIAGNOSIS — C801 Malignant (primary) neoplasm, unspecified: Secondary | ICD-10-CM

## 2013-06-16 DIAGNOSIS — C569 Malignant neoplasm of unspecified ovary: Secondary | ICD-10-CM

## 2013-06-16 DIAGNOSIS — Z1509 Genetic susceptibility to other malignant neoplasm: Principal | ICD-10-CM

## 2013-06-16 DIAGNOSIS — E049 Nontoxic goiter, unspecified: Secondary | ICD-10-CM

## 2013-06-16 DIAGNOSIS — Z8543 Personal history of malignant neoplasm of ovary: Secondary | ICD-10-CM

## 2013-06-16 DIAGNOSIS — E559 Vitamin D deficiency, unspecified: Secondary | ICD-10-CM

## 2013-06-16 DIAGNOSIS — Z1502 Genetic susceptibility to malignant neoplasm of ovary: Secondary | ICD-10-CM

## 2013-06-16 DIAGNOSIS — Z1501 Genetic susceptibility to malignant neoplasm of breast: Principal | ICD-10-CM

## 2013-06-16 DIAGNOSIS — F17200 Nicotine dependence, unspecified, uncomplicated: Secondary | ICD-10-CM

## 2013-06-16 LAB — TECHNOLOGIST REVIEW CHCC SATELLITE

## 2013-06-16 LAB — CBC WITH DIFFERENTIAL (CANCER CENTER ONLY)
BASO#: 0 10*3/uL (ref 0.0–0.2)
BASO%: 0.2 % (ref 0.0–2.0)
EOS ABS: 0.1 10*3/uL (ref 0.0–0.5)
EOS%: 1.5 % (ref 0.0–7.0)
HCT: 43.7 % (ref 34.8–46.6)
HEMOGLOBIN: 14.5 g/dL (ref 11.6–15.9)
LYMPH#: 2 10*3/uL (ref 0.9–3.3)
LYMPH%: 32.8 % (ref 14.0–48.0)
MCH: 28.7 pg (ref 26.0–34.0)
MCHC: 33.2 g/dL (ref 32.0–36.0)
MCV: 87 fL (ref 81–101)
MONO#: 0.4 10*3/uL (ref 0.1–0.9)
MONO%: 6.8 % (ref 0.0–13.0)
NEUT%: 58.7 % (ref 39.6–80.0)
NEUTROS ABS: 3.5 10*3/uL (ref 1.5–6.5)
PLATELETS: 209 10*3/uL (ref 145–400)
RBC: 5.05 10*6/uL (ref 3.70–5.32)
RDW: 14.8 % (ref 11.1–15.7)
WBC: 6 10*3/uL (ref 3.9–10.0)

## 2013-06-16 NOTE — Progress Notes (Signed)
This office note has been dictated.

## 2013-06-16 NOTE — Progress Notes (Signed)
Pt was flushed on Friday, 06-11-13. No need to repeat today.

## 2013-06-16 NOTE — Patient Instructions (Signed)
You Can Quit Smoking If you are ready to quit smoking or are thinking about it, congratulations! You have chosen to help yourself be healthier and live longer! There are lots of different ways to quit smoking. Nicotine gum, nicotine patches, a nicotine inhaler, or nicotine nasal spray can help with physical craving. Hypnosis, support groups, and medicines help break the habit of smoking. TIPS TO GET OFF AND STAY OFF CIGARETTES  Learn to predict your moods. Do not let a bad situation be your excuse to have a cigarette. Some situations in your life might tempt you to have a cigarette.  Ask friends and co-workers not to smoke around you.  Make your home smoke-free.  Never have "just one" cigarette. It leads to wanting another and another. Remind yourself of your decision to quit.  On a card, make a list of your reasons for not smoking. Read it at least the same number of times a day as you have a cigarette. Tell yourself everyday, "I do not want to smoke. I choose not to smoke."  Ask someone at home or work to help you with your plan to quit smoking.  Have something planned after you eat or have a cup of coffee. Take a walk or get other exercise to perk you up. This will help to keep you from overeating.  Try a relaxation exercise to calm you down and decrease your stress. Remember, you may be tense and nervous the first two weeks after you quit. This will pass.  Find new activities to keep your hands busy. Play with a pen, coin, or rubber band. Doodle or draw things on paper.  Brush your teeth right after eating. This will help cut down the craving for the taste of tobacco after meals. You can try mouthwash too.  Try gum, breath mints, or diet candy to keep something in your mouth. IF YOU SMOKE AND WANT TO QUIT:  Do not stock up on cigarettes. Never buy a carton. Wait until one pack is finished before you buy another.  Never carry cigarettes with you at work or at home.  Keep cigarettes  as far away from you as possible. Leave them with someone else.  Never carry matches or a lighter with you.  Ask yourself, "Do I need this cigarette or is this just a reflex?"  Bet with someone that you can quit. Put cigarette money in a piggy bank every morning. If you smoke, you give up the money. If you do not smoke, by the end of the week, you keep the money.  Keep trying. It takes 21 days to change a habit!  Talk to your doctor about using medicines to help you quit. These include nicotine replacement gum, lozenges, or skin patches. Document Released: 02/23/2009 Document Revised: 07/22/2011 Document Reviewed: 02/23/2009 [employer] Patient Information 2014 [employer].  

## 2013-06-17 ENCOUNTER — Telehealth: Payer: Self-pay | Admitting: *Deleted

## 2013-06-17 LAB — COMPREHENSIVE METABOLIC PANEL
ALT: 29 U/L (ref 0–35)
AST: 24 U/L (ref 0–37)
Albumin: 4.1 g/dL (ref 3.5–5.2)
Alkaline Phosphatase: 99 U/L (ref 39–117)
BUN: 16 mg/dL (ref 6–23)
CO2: 28 meq/L (ref 19–32)
Calcium: 9.9 mg/dL (ref 8.4–10.5)
Chloride: 98 mEq/L (ref 96–112)
Creatinine, Ser: 0.84 mg/dL (ref 0.50–1.10)
Glucose, Bld: 92 mg/dL (ref 70–99)
POTASSIUM: 3.8 meq/L (ref 3.5–5.3)
SODIUM: 135 meq/L (ref 135–145)
TOTAL PROTEIN: 7.2 g/dL (ref 6.0–8.3)
Total Bilirubin: 0.4 mg/dL (ref 0.2–1.2)

## 2013-06-17 LAB — IRON AND TIBC CHCC
%SAT: 26 % (ref 21–57)
IRON: 90 ug/dL (ref 41–142)
TIBC: 348 ug/dL (ref 236–444)
UIBC: 257 ug/dL (ref 120–384)

## 2013-06-17 LAB — LACTATE DEHYDROGENASE: LDH: 166 U/L (ref 94–250)

## 2013-06-17 LAB — VITAMIN D 25 HYDROXY (VIT D DEFICIENCY, FRACTURES): Vit D, 25-Hydroxy: 47 ng/mL (ref 30–89)

## 2013-06-17 LAB — FERRITIN CHCC: Ferritin: 50 ng/ml (ref 9–269)

## 2013-06-17 NOTE — Progress Notes (Signed)
DIAGNOSES: 1. 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 2. BRCA1 positive. 3. Severe chemotherapy-induced neuropathy. 4. Intermittent iron-deficiency anemia.  CURRENT THERAPY:  Observation.  INTERIM HISTORY:  [name] comes in for followup.  We last saw her back in September.  Recently, she had to have a Port-A-Cath replaced. It apparently had twisted little bit, and I think it got up into her neck.  When we last saw her in September, she was having some lower back pain. She saw a Restaurant manager, fast food.  The pain seems to be doing better.  She has had no problems with cough.  She is still smoking about a 1/2-1 pack a day.  She has had no nausea or vomiting.  There has been no change in bowel or bladder habits.  She has not noticed any obvious bleeding.  Her neuropathy still is present.  It is still significant.  She seems to be tolerating it a little bit better with medication.  When we saw her back in September, her ferritin was 83 with an iron saturation of 40%.  Again she is BRCA1 positive.  We will get her set up with a mammogram this year.  She has had no fever.  She has had no problems with infections.  PHYSICAL EXAMINATION:  General:  This is a well-developed, well- nourished African-American female in no obvious distress.  Vital Signs: Temperature of 98, pulse 91, respiratory rate 14, blood pressure 124/99, weight is 239 pounds.  Head and Neck:  Normocephalic, atraumatic skull. She has some fullness in the thyroid region.  The left thyroid lobe might be a little more prominent.  There is no obvious adenopathy in the neck.  Lungs:  Clear bilaterally.  Cardiac:  Regular rate and rhythm with a normal S1, S2.  There are no murmurs, rubs, or bruits.  Abdomen: Soft.  She has good bowel sounds.  She has no palpable abdominal mass. There is no fluid wave.  She has a well-healed laparotomy scar.  There is no palpable hepatosplenomegaly.  Breasts:  No breast  masses bilaterally.  She has had breast reduction surgery.  There is no axillary adenopathy bilaterally.  Extremities:  No clubbing, cyanosis, or edema.  She has good range motion of her joints.  She has no obvious venous cord in her legs.  She had good pulses in her distal extremities. Neurological:  No focal neurological deficits.  LABORATORY STUDIES:  White cell count is 6, hemoglobin 14.5, hematocrit 43.7, platelet count 209.  IMPRESSION:  [name] is a very nice 53 year old African-American female.  She presented with a poorly differentiated carcinoma.  This was in the abdomen.  She underwent a hysterectomy and oophorectomy. Surprised that everything was negative.  Molecular genetic studies showed that this tumor was highly likely to be ovarian in origin.  She had chemotherapy with 6 cycles of carboplatin/Taxotere.  She completed this over 5 years ago.  I really think that she is cured of this.  Again, she is BRCA1 positive.  We have to make sure we maintain close surveillance of her breast.  We will now plan to get her back in another couple months.  We will see about thyroid ultrasound on her.  She is to have a Port-A-Cath flushed in a couple months.    ______________________________ [name], M.D. PRE/MEDQ  D:  06/16/2013  T:  06/17/2013  Job:  [id number]

## 2013-06-17 NOTE — Telephone Encounter (Signed)
Message copied by Rico Ala on Thu Jun 17, 2013 11:34 AM ------      Message from: [name]      Created: Thu Jun 17, 2013  6:28 AM       Call - labs look great!!!  Laurey Arrow ------

## 2013-06-17 NOTE — Telephone Encounter (Signed)
Called patient to let her know that her labwork looks great per dr. Marin Olp

## 2013-06-21 ENCOUNTER — Other Ambulatory Visit: Payer: Self-pay | Admitting: Hematology & Oncology

## 2013-06-21 ENCOUNTER — Other Ambulatory Visit: Payer: Self-pay | Admitting: Neurology

## 2013-06-21 NOTE — Telephone Encounter (Signed)
Rx signed and faxed.

## 2013-06-21 NOTE — Telephone Encounter (Signed)
Per Dr Dohmeier's note: [name] Seat, MD 10/14/2012 5:06 PM :   Patient only gets to sleep with 2 10 mg tab . Try to change her to 12.5 Er form of Ambien.    Dr Brett Fairy is out of the office.  Forwarding request to [facility]

## 2013-06-23 ENCOUNTER — Telehealth: Payer: Self-pay | Admitting: Hematology & Oncology

## 2013-06-23 NOTE — Telephone Encounter (Signed)
Pt called wants to move 2-16 appointments I gave her the number and transferred her

## 2013-06-25 ENCOUNTER — Other Ambulatory Visit: Payer: Self-pay | Admitting: Oncology

## 2013-06-25 ENCOUNTER — Telehealth: Payer: Self-pay | Admitting: Oncology

## 2013-06-25 ENCOUNTER — Telehealth: Payer: Self-pay | Admitting: Hematology & Oncology

## 2013-06-25 DIAGNOSIS — D649 Anemia, unspecified: Secondary | ICD-10-CM

## 2013-06-25 NOTE — Telephone Encounter (Addendum)
Message copied by Cottie Banda on Fri Jun 25, 2013  4:20 PM ------      Message from: Burney Gauze R      Created: Thu Jun 24, 2013  9:27 PM       Call - iron level is dropping.  Probably needs 1 dose of Feraheme 1076m.  Please set up.  Pete ------Left message for pt to call and schedule with RLiliane Channel I will put order in signed and held.

## 2013-06-25 NOTE — Telephone Encounter (Signed)
Left message for pt to call and schedule appointment

## 2013-06-28 ENCOUNTER — Ambulatory Visit (HOSPITAL_BASED_OUTPATIENT_CLINIC_OR_DEPARTMENT_OTHER): Payer: Self-pay

## 2013-06-28 ENCOUNTER — Ambulatory Visit (HOSPITAL_BASED_OUTPATIENT_CLINIC_OR_DEPARTMENT_OTHER): Payer: Medicare Other

## 2013-06-30 ENCOUNTER — Ambulatory Visit (HOSPITAL_BASED_OUTPATIENT_CLINIC_OR_DEPARTMENT_OTHER): Payer: Self-pay

## 2013-06-30 ENCOUNTER — Ambulatory Visit (HOSPITAL_BASED_OUTPATIENT_CLINIC_OR_DEPARTMENT_OTHER): Payer: Medicare Other

## 2013-07-07 ENCOUNTER — Ambulatory Visit (HOSPITAL_BASED_OUTPATIENT_CLINIC_OR_DEPARTMENT_OTHER)
Admission: RE | Admit: 2013-07-07 | Discharge: 2013-07-07 | Disposition: A | Discharge status: Home / Self Care | Payer: Medicare Other | Source: Ambulatory Visit | Attending: Hematology & Oncology | Admitting: Hematology & Oncology

## 2013-07-07 ENCOUNTER — Other Ambulatory Visit: Payer: Self-pay | Admitting: Hematology & Oncology

## 2013-07-07 DIAGNOSIS — C569 Malignant neoplasm of unspecified ovary: Secondary | ICD-10-CM

## 2013-07-07 DIAGNOSIS — Z1501 Genetic susceptibility to malignant neoplasm of breast: Principal | ICD-10-CM

## 2013-07-07 DIAGNOSIS — C801 Malignant (primary) neoplasm, unspecified: Secondary | ICD-10-CM

## 2013-07-07 DIAGNOSIS — Z1509 Genetic susceptibility to other malignant neoplasm: Secondary | ICD-10-CM

## 2013-07-07 DIAGNOSIS — E049 Nontoxic goiter, unspecified: Secondary | ICD-10-CM

## 2013-07-09 ENCOUNTER — Telehealth: Payer: Self-pay | Admitting: Nurse Practitioner

## 2013-07-09 NOTE — Telephone Encounter (Addendum)
Message copied by Jimmy Footman on Fri Jul 09, 2013 11:30 AM ------      Message from: Burney Gauze R      Created: Thu Jul 08, 2013 12:26 PM       Call - thyroid looks good. No suspicious nodules!!  No need for biopsy!!  [name] ------Pt verbalized understanding and appreciation.

## 2013-07-22 ENCOUNTER — Other Ambulatory Visit: Payer: Self-pay | Admitting: Hematology & Oncology

## 2013-07-22 ENCOUNTER — Other Ambulatory Visit: Payer: Self-pay | Admitting: Internal Medicine

## 2013-07-22 ENCOUNTER — Telehealth: Payer: Self-pay | Admitting: Hematology & Oncology

## 2013-07-22 NOTE — Telephone Encounter (Signed)
Pt made 3-20 iron appointment

## 2013-07-23 ENCOUNTER — Telehealth: Payer: Self-pay | Admitting: Family Medicine

## 2013-07-23 NOTE — Telephone Encounter (Signed)
Ppt's appt has been scheled 6/23. (1:45 pm) FYI: Pt would like you to know she is having an iron infusion done Fri, 3/20 Pt also had to have a port repair last month.  Also pt asking about refill request.  (Lyrica and maxide) Let me know if I need to sch labs prior to appt

## 2013-07-23 NOTE — Telephone Encounter (Signed)
Please advise what labs you want done before physical.  Looks like she has been having lab work done with other physicians. Pt would like a refill of her Lyrica.  Please advise.  Thanks!

## 2013-07-23 NOTE — Telephone Encounter (Signed)
Per [facility], this pt needs to be placed on the schedule at the end of June for her annual wellness.  Please make the appointment with the pt. When scheduled, please send this back to me.  I need to find out what lab work [facility] wants as she has had lab work elsewhere. Thanks!

## 2013-07-23 NOTE — Telephone Encounter (Signed)
Ok to refill through end of June  Have her schedule preventive visit before this runs out

## 2013-07-26 NOTE — Telephone Encounter (Signed)
No labs before visit . We can do any labs needed at the visit if needed. Ok to refill lyrica  And maxide for 6 months

## 2013-07-27 NOTE — Telephone Encounter (Signed)
Medication has been sent to the pharmacy. No lab work needed.

## 2013-07-30 ENCOUNTER — Ambulatory Visit: Payer: Self-pay

## 2013-08-04 ENCOUNTER — Ambulatory Visit (HOSPITAL_BASED_OUTPATIENT_CLINIC_OR_DEPARTMENT_OTHER): Payer: Medicare Other

## 2013-08-04 VITALS — BP 143/98 | HR 77 | Temp 96.9°F | Resp 16

## 2013-08-04 DIAGNOSIS — D649 Anemia, unspecified: Secondary | ICD-10-CM

## 2013-08-04 DIAGNOSIS — C801 Malignant (primary) neoplasm, unspecified: Secondary | ICD-10-CM

## 2013-08-04 DIAGNOSIS — D509 Iron deficiency anemia, unspecified: Secondary | ICD-10-CM

## 2013-08-04 MED ORDER — SODIUM CHLORIDE 0.9 % IV SOLN
INTRAVENOUS | Status: DC
  Administered 2013-08-04: 14:00:00 via INTRAVENOUS

## 2013-08-04 MED ORDER — SODIUM CHLORIDE 0.9 % IJ SOLN
10.0000 mL | INTRAMUSCULAR | Status: DC | PRN
  Filled 2013-08-04: qty 10

## 2013-08-04 MED ORDER — SODIUM CHLORIDE 0.9 % IV SOLN
1020.0000 mg | Freq: Once | INTRAVENOUS | Status: AC
  Administered 2013-08-04: 1020 mg via INTRAVENOUS
  Filled 2013-08-04: qty 34

## 2013-08-04 MED ORDER — HEPARIN SOD (PORK) LOCK FLUSH 100 UNIT/ML IV SOLN
500.0000 [IU] | Freq: Once | INTRAVENOUS | Status: DC | PRN
  Filled 2013-08-04: qty 5

## 2013-08-04 NOTE — Patient Instructions (Signed)

## 2013-08-20 ENCOUNTER — Other Ambulatory Visit: Payer: Self-pay | Admitting: Hematology & Oncology

## 2013-08-20 ENCOUNTER — Other Ambulatory Visit: Payer: Self-pay | Admitting: Internal Medicine

## 2013-09-22 ENCOUNTER — Encounter: Payer: Self-pay | Admitting: Neurology

## 2013-09-22 ENCOUNTER — Encounter (INDEPENDENT_AMBULATORY_CARE_PROVIDER_SITE_OTHER): Payer: Self-pay

## 2013-09-22 ENCOUNTER — Ambulatory Visit (INDEPENDENT_AMBULATORY_CARE_PROVIDER_SITE_OTHER): Payer: Medicare Other | Admitting: Neurology

## 2013-09-22 VITALS — BP 113/83 | HR 82 | Resp 16 | Ht 68.75 in | Wt 240.0 lb

## 2013-09-22 DIAGNOSIS — R4189 Other symptoms and signs involving cognitive functions and awareness: Principal | ICD-10-CM

## 2013-09-22 DIAGNOSIS — R29818 Other symptoms and signs involving the nervous system: Secondary | ICD-10-CM

## 2013-09-22 DIAGNOSIS — G620 Drug-induced polyneuropathy: Secondary | ICD-10-CM

## 2013-09-22 DIAGNOSIS — T451X5A Adverse effect of antineoplastic and immunosuppressive drugs, initial encounter: Secondary | ICD-10-CM

## 2013-09-22 MED ORDER — PREGABALIN 150 MG PO CAPS: 150.0000 mg | ORAL_CAPSULE | Freq: Two times a day (BID) | ORAL | Status: DC

## 2013-09-22 MED ORDER — ZOLPIDEM TARTRATE ER 12.5 MG PO TBCR: 12.5000 mg | EXTENDED_RELEASE_TABLET | Freq: Every day | ORAL | Status: DC

## 2013-09-22 NOTE — Patient Instructions (Signed)
Place neck pain patient instructions here.

## 2013-09-22 NOTE — Progress Notes (Signed)
[facility]  Provider:  Dr Domonic Kimball Referring Provider: Burnis Medin, MD Primary Care Physician:  Lottie Dawson, MD  Chief Complaint  Patient presents with  . Follow-up    Room 10  . cognitive impairment    MMSe- 29/30   AFT- 18    HPI:  [name] is a 53 y.o. female here as a referral from Dr. Regis Bill and Dr Marin Olp , she seen regularly for follow up for neuropathy and cognitive impairment;   09-22-13 ; She has chronic back pain for close to a year and would like to be referred to a pain internationalist. She tried chiropractor, accupuncture, massage .  Her neuropathic condition is her main concern today', the patient feels impaired by pain night and day. She feels also that she has less exercise tolerance because of the pain therefore gaining weight. She has also noted that her fine motor skills are somewhat impaired especially the numbness in her fingertips. Meanwhile the neuropathy hasn't ascended from the tips of the fingers to the mid palm. She cannot pick up a needle for example of she has trouble closing jewelry or buttons. In her feet,  her neuropathy spread from the toes to the mid plantar area , involving the forefoot to the dorsum. This impairs gait stability. She is especially at fall risk on uneven surface, such as thick carpets, sand or grass. She fell 2 weeks ago on [place] , the sidewalk was uneven - [place] Falls have not let to emergency admissions. She slipped in a store and in her shower. No new concussion.   Cognition has improved by MMSE result , on Aricept. She feels her processing speed is slower, and she still has trouble remembering names . For this reason , I added a MOCA test.        History recapture., CD: [name] is a 53 year old right-handed female, whom  I first encountered in 2010-.  She is de facto a new patient at her last appointment due to a 3 year hiatus. .   I  begun treating her with  Aricept for memory loss . At the base of her of symptoms is a metastatic cancer with unknown primary tumor.  The patient underwent a complete hysterectomy this or for ectomy 2010. She begun and for all very uncomfortable as her oncologist based on the histology results from metastatic.  I believe her chemotherapy begun already in December 2009. She became severely pain impaired, had  aching soreness and started to get some relief from Lyrica -  but remained with an impaired sense of fine touch temperature and pinprick,  as well as balance problems and ataxia. She is here today for refills but also for a reevaluation.   In 2011 with me .The patient had that time suffered a backwards fall and a concussion. She was discharged after her head CT at the: ER but continued to have dizziness nausea of forgetfulness and was severely ataxic when I saw her.  During my evaluation with the patient became evident that she has severe memory deficits,  she also has a neuropathy.   Review of Systems: Out of a complete 14 system review, the patient complains of only the following symptoms, and all other reviewed systems are negative. Memory loss, neuropathy, ataxia, anxiety, high cholesterol,  episodic hypertension.high fall risk, retropulsion, positive romberg.   History   Social History  . Marital Status: Divorced    Spouse Name: N/A  Number of Children: 1  . Years of Education: Master's   Occupational History  .      disabled   Social History Main Topics  . Smoking status: Current Every Day Smoker -- 0.50 packs/day for 37 years    Types: Cigarettes    Start date: 06/16/1978  . Smokeless tobacco: Never Used     Comment: 1/2 pack daily  . Alcohol Use: Yes     Comment: rare, 3 weekly  . Drug Use: No  . Sexual Activity: Not on file   Other Topics Concern  . Not on file   Social History Narrative   Divorced   1 son /1 adopted adult son   Sales occupation on disability for now   Regular  exercise-yes money stress   Daily caffeine use 2-3 and 2 cups of coffee   On since a security disability since 2012   Patient is right-handed.   Patient has a Master's degree.    Family History  Problem Relation Age of Onset  . Pulmonary embolism Mother   . Hypertension Father   . Breast cancer Maternal Grandmother     diagnosed in early 9s  . Hyperlipidemia      Past Medical History  Diagnosis Date  . Hx pulmonary embolism   . Obesity   . Metrorrhagia   . Allergic rhinitis   . Iron deficiency anemia   . Hypertension   . Alopecia   . Vitamin D deficiency   . HLD (hyperlipidemia)   . Adjustment disorder   . Anxiety and depression   . Raynaud's syndrome   . Right groin mass   . 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 Squamous cell carcinoma of skin   . Ovarian cancer 48  . Vision abnormalities   . Neuropathy   . 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    Past Surgical History  Procedure Laterality Date  . Breast reduction surgery  2000  . Gastric bypass  1979  . Tonsillectomy    . Cesarean section      1991  . Abdominal hysterectomy  05/2008    TAH/BSO  . Colonoscopy  2004; 12/07/10    hemorrhoids    Current Outpatient Prescriptions  Medication Sig Dispense Refill  . Carboxymethylcellulose Sodium (REFRESH LIQUIGEL OP) Apply 1 drop to eye 2 (two) times daily.      Marland Kitchen donepezil (ARICEPT) 10 MG tablet Take 10 mg by mouth every morning.       . DULoxetine (CYMBALTA) 60 MG capsule TAKE ONE CAPSULE BY MOUTH DAILY  30 capsule  0  . LYRICA 150 MG capsule TAKE 1 CAPSULE BY MOUTH TWICE DAILY  60 capsule  3  . Multiple Vitamin (MULTIVITAMIN WITH MINERALS) TABS tablet Take 1 tablet by mouth daily.      Marland Kitchen omega-3 acid ethyl esters (LOVAZA) 1 G capsule Take 1 g by mouth daily.      . orphenadrine (NORFLEX) 100 MG tablet TAKE 1 TABLET BY MOUTH TWICE DAILY AS NEEDED FOR MUSCLE SPASMS  60 tablet  2  . potassium  chloride (K-DUR,KLOR-CON) 10 MEQ tablet Take 20 mEq by mouth every morning.      . simvastatin (ZOCOR) 40 MG tablet TAKE 1 TABLET BY MOUTH DAILY  30 tablet  2  . triamterene-hydrochlorothiazide (MAXZIDE-25) 37.5-25 MG per tablet TAKE 2 TABLETS BY MOUTH ONCE DAILY  60 tablet  3  . Vitamin D, Ergocalciferol, (DRISDOL) 50000  UNITS CAPS capsule TAKE 1 CAPSULE BY MOUTH EVERY WEEK  12 capsule  0  . zolpidem (AMBIEN CR) 12.5 MG CR tablet TAKE 1 TABLET BY MOUTH AT BEDTIME AS NEEDED FOR SLEEP  30 tablet  3   No current facility-administered medications for this visit.    Allergies as of 09/22/2013 - Review Complete 09/22/2013  Allergen Reaction Noted  . Codeine    . Orange Itching and Rash 07/06/2010    Vitals: BP 113/83  Pulse 82  Resp 16  Ht 5' 8.75" (1.746 m)  Wt 240 lb (108.863 kg)  BMI 35.71 kg/m2 Last Weight:  Wt Readings from Last 1 Encounters:  09/22/13 240 lb (108.863 kg)   Last Height:   Ht Readings from Last 1 Encounters:  09/22/13 5' 8.75" (1.746 m)   Vision Screening:  See vitals  Physical exam:  General: The patient is awake, alert and appears not in acute distress. The patient is well groomed. Head: Normocephalic, atraumatic. Neck is supple. Mallampati 3 , neck circumference:19.5 , thick neck.  Hoarse voice, no  Bruxism, clicking at  TMJ.  unrestricted nasal air flow.  Cardiovascular:  Regular rate and rhythm, without  murmurs or carotid bruit, and without distended neck veins. Respiratory: Lungs are clear to auscultation. Skin:  Without evidence of edema, or rash Trunk: BMI is elevated , but the patient has a normal posture.  Neurologic exam : The patient is awake and alert, oriented to place and time.   Memory: tested by MMSE 29-30 , followed by  MOCA: 27 out of 30 - very strong result, normal for age and education.   . There is a normal attention span & concentration ability. Speech is fluent without  dysarthria, dysphonia or aphasia.  Mood and affect are  appropriate. She is happy, content and satisfied with her medical care.   Cranial nerves: Pupils are equal and briskly reactive to light. Funduscopic exam without evidence of pallor or edema. Extraocular movements  in vertical and horizontal planes intact and without nystagmus. Visual fields by finger perimetry are intact. Hearing to finger rub intact.  Facial sensation intact to fine touch. Facial motor strength is symmetric and tongue and uvula move midline.  Motor exam:  Normal tone and normal muscle bulk and symmetric normal strength in all extremities.  Sensory:  Fine touch, pinprick and vibration were tested in all extremities. Proprioception is tested in the upper extremities only.   Coordination: Rapid alternating movements in the fingers/hands is tested and normal.  Finger-to-nose maneuver tested and normal without evidence of ataxia, dysmetria or tremor.  Gait and station: Patient walks without assistive device. Strength within normal limits. Stance is stable and wider based . Tandem gait is ataxic - unsteady , she  turns with 4 Steps, which  are unfragmented.  Romberg testing shows her again  swaying , but she did not need corrective steps , did not pro pulse or retro pulse. She has no impaired righting reflexes.   Deep tendon reflexes: in the  upper and lower extremities are symmetric and intact. Babinski maneuver response is downgoing.   Assessment:  After physical and neurologic examination, review of laboratory studies, imaging,  neurophysiology testing and pre-existing records, assessment :   Please note that this patients records from 3 plus years ago  are in centricity. MRI , PSG , Labs. The patient is on CYMBALTA and LYRICA , I cannot add SSRI.       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  Vibration and fine touch sensation are significantly diminished in both feet and at the ankle level. She a cannot longer type well, 'hunt and peck" /.  In addition  there was significant memory loss , which has improved !! . Her MMSE  was 25/30 points she missed all 3 recall words, could not name the type of building and the date in 2014 , now 29-30 points, and MOCA 27 out of 30 .  She is still unable to read a book or watch several installments of a series on TV, since earlier events in the story line are not remembered.   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 She would also not be able to do a physically demanding job her long-standing walking if necessary, due to her ataxia and neuropathy.  The BMI and large neck predispose her to apnea , possible contributor to insomnia,   Plan:  Treatment plan and additional workup will be reviewed under Problem List.  Continue Aricept. Referral to pain intervention, Dr. Maryjean Ka.  Patient not interested in another round of PT. Neuropathy treatment with Lyrica.   Weight gain , snoring , wakes up , uses Ambien nightly.  SPLIT study scheduled.

## 2013-10-13 ENCOUNTER — Telehealth: Payer: Self-pay | Admitting: Hematology & Oncology

## 2013-10-13 ENCOUNTER — Other Ambulatory Visit: Payer: Self-pay | Admitting: Lab

## 2013-10-13 ENCOUNTER — Ambulatory Visit: Payer: Self-pay | Admitting: Hematology & Oncology

## 2013-10-13 NOTE — Telephone Encounter (Signed)
Patient was running late for appt, so she cx her 10/13/13 appt and stated she will call back tomorrow to resch

## 2013-10-17 ENCOUNTER — Other Ambulatory Visit: Payer: Self-pay | Admitting: Hematology & Oncology

## 2013-10-17 ENCOUNTER — Other Ambulatory Visit: Payer: Self-pay | Admitting: Neurology

## 2013-10-21 ENCOUNTER — Telehealth: Payer: Self-pay | Admitting: *Deleted

## 2013-10-21 NOTE — Telephone Encounter (Signed)
Made appt for HST setup for 10/22/13 for 11 AM.

## 2013-10-22 ENCOUNTER — Ambulatory Visit (INDEPENDENT_AMBULATORY_CARE_PROVIDER_SITE_OTHER): Payer: Medicare Other | Admitting: Neurology

## 2013-10-22 ENCOUNTER — Encounter: Payer: Self-pay | Admitting: *Deleted

## 2013-10-22 DIAGNOSIS — R0902 Hypoxemia: Secondary | ICD-10-CM

## 2013-10-22 DIAGNOSIS — R4189 Other symptoms and signs involving cognitive functions and awareness: Principal | ICD-10-CM

## 2013-10-22 DIAGNOSIS — R29818 Other symptoms and signs involving the nervous system: Secondary | ICD-10-CM

## 2013-10-22 DIAGNOSIS — T451X5A Adverse effect of antineoplastic and immunosuppressive drugs, initial encounter: Secondary | ICD-10-CM

## 2013-10-22 DIAGNOSIS — G620 Drug-induced polyneuropathy: Secondary | ICD-10-CM

## 2013-10-22 NOTE — Sleep Study (Signed)
Patient arrives for HST instruction.  Patient is given written instructions, picture instructions, and a demonstration on how to use HST unit.  All questions / concerns were addressed by technologist.  Financial responsibility was explained.  Follow up information was given to patient regarding how results would be received.  Patient explains extensively that despite her improvement which Dr. Brett Fairy observed in memory testing, the test asked if she could remember/recall 3 words, which she did better on.  Her primary issue involves processes/procedures.  Going home and using the HST device is absolutely one of those things she may have trouble with.  I explained she just needed to do the best she can and not to worry.  If we do our best and no information is obtained, we will proceed to ask for auth from insurance to bring her into the sleep lab.  I gave her a copy of the instructions but also wrote them into 4 simplified steps, she was given a demonstration and access to a 24/7 on call phone number to reach me.  She also has instructions to access a how-to video on YouTube.

## 2013-11-02 ENCOUNTER — Encounter: Payer: Self-pay | Admitting: Internal Medicine

## 2013-11-02 ENCOUNTER — Ambulatory Visit (INDEPENDENT_AMBULATORY_CARE_PROVIDER_SITE_OTHER): Payer: Medicare Other | Admitting: Internal Medicine

## 2013-11-02 VITALS — BP 108/80 | Temp 97.8°F | Ht 68.5 in | Wt 241.0 lb

## 2013-11-02 DIAGNOSIS — M5430 Sciatica, unspecified side: Secondary | ICD-10-CM

## 2013-11-02 DIAGNOSIS — M5442 Lumbago with sciatica, left side: Secondary | ICD-10-CM

## 2013-11-02 DIAGNOSIS — R4189 Other symptoms and signs involving cognitive functions and awareness: Secondary | ICD-10-CM

## 2013-11-02 DIAGNOSIS — G620 Drug-induced polyneuropathy: Secondary | ICD-10-CM

## 2013-11-02 DIAGNOSIS — Z0000 Encounter for general adult medical examination without abnormal findings: Secondary | ICD-10-CM

## 2013-11-02 DIAGNOSIS — D508 Other iron deficiency anemias: Secondary | ICD-10-CM

## 2013-11-02 DIAGNOSIS — T451X5A Adverse effect of antineoplastic and immunosuppressive drugs, initial encounter: Secondary | ICD-10-CM

## 2013-11-02 DIAGNOSIS — Z1509 Genetic susceptibility to other malignant neoplasm: Secondary | ICD-10-CM

## 2013-11-02 DIAGNOSIS — Z15068 Genetic susceptibility to other malignant neoplasm of digestive system: Secondary | ICD-10-CM

## 2013-11-02 DIAGNOSIS — F17200 Nicotine dependence, unspecified, uncomplicated: Secondary | ICD-10-CM

## 2013-11-02 DIAGNOSIS — Z23 Encounter for immunization: Secondary | ICD-10-CM

## 2013-11-02 DIAGNOSIS — Z1501 Genetic susceptibility to malignant neoplasm of breast: Secondary | ICD-10-CM

## 2013-11-02 DIAGNOSIS — E785 Hyperlipidemia, unspecified: Secondary | ICD-10-CM

## 2013-11-02 DIAGNOSIS — Z8543 Personal history of malignant neoplasm of ovary: Secondary | ICD-10-CM

## 2013-11-02 DIAGNOSIS — I10 Essential (primary) hypertension: Secondary | ICD-10-CM

## 2013-11-02 DIAGNOSIS — E669 Obesity, unspecified: Secondary | ICD-10-CM | POA: Insufficient documentation

## 2013-11-02 DIAGNOSIS — G622 Polyneuropathy due to other toxic agents: Secondary | ICD-10-CM

## 2013-11-02 DIAGNOSIS — C801 Malignant (primary) neoplasm, unspecified: Secondary | ICD-10-CM

## 2013-11-02 DIAGNOSIS — F09 Unspecified mental disorder due to known physiological condition: Secondary | ICD-10-CM

## 2013-11-02 LAB — LIPID PANEL
CHOL/HDL RATIO: 3
CHOLESTEROL: 161 mg/dL (ref 0–200)
HDL: 51.2 mg/dL (ref >39.00)
LDL Cholesterol: 76 mg/dL (ref 0–99)
NonHDL: 109.8
TRIGLYCERIDES: 171 mg/dL — AB (ref 0.0–149.0)
VLDL: 34.2 mg/dL (ref 0.0–40.0)

## 2013-11-02 LAB — CBC WITH DIFFERENTIAL/PLATELET
BASOS PCT: 0.4 % (ref 0.0–3.0)
Basophils Absolute: 0 10*3/uL (ref 0.0–0.1)
EOS ABS: 0.1 10*3/uL (ref 0.0–0.7)
Eosinophils Relative: 1.6 % (ref 0.0–5.0)
HCT: 41.8 % (ref 36.0–46.0)
Hemoglobin: 13.9 g/dL (ref 12.0–15.0)
LYMPHS PCT: 25 % (ref 12.0–46.0)
Lymphs Abs: 1.5 10*3/uL (ref 0.7–4.0)
MCHC: 33.3 g/dL (ref 30.0–36.0)
MCV: 90.3 fl (ref 78.0–100.0)
Monocytes Absolute: 0.3 10*3/uL (ref 0.1–1.0)
Monocytes Relative: 5 % (ref 3.0–12.0)
Neutro Abs: 4 10*3/uL (ref 1.4–7.7)
Neutrophils Relative %: 68 % (ref 43.0–77.0)
Platelets: 214 10*3/uL (ref 150.0–400.0)
RBC: 4.63 Mil/uL (ref 3.87–5.11)
RDW: 15.8 % — ABNORMAL HIGH (ref 11.5–15.5)
WBC: 5.9 10*3/uL (ref 4.0–10.5)

## 2013-11-02 LAB — COMPREHENSIVE METABOLIC PANEL
ALBUMIN: 4.1 g/dL (ref 3.5–5.2)
ALT: 30 U/L (ref 0–35)
AST: 32 U/L (ref 0–37)
Alkaline Phosphatase: 80 U/L (ref 39–117)
BUN: 20 mg/dL (ref 6–23)
CALCIUM: 9.6 mg/dL (ref 8.4–10.5)
CHLORIDE: 99 meq/L (ref 96–112)
CO2: 28 meq/L (ref 19–32)
Creatinine, Ser: 1 mg/dL (ref 0.4–1.2)
GFR: 76.28 mL/min (ref >60.00)
Glucose, Bld: 87 mg/dL (ref 70–99)
POTASSIUM: 3.7 meq/L (ref 3.5–5.1)
SODIUM: 137 meq/L (ref 135–145)
TOTAL PROTEIN: 6.9 g/dL (ref 6.0–8.3)
Total Bilirubin: 0.5 mg/dL (ref 0.2–1.2)

## 2013-11-02 LAB — TSH: TSH: 0.57 u[IU]/mL (ref 0.35–4.50)

## 2013-11-02 NOTE — Patient Instructions (Signed)
Labs today  Will get results to dr E. And dr D Try to stop tobacco. Any activity is good  prevnar 13 today  Can get prevnar  Future time .

## 2013-11-02 NOTE — Progress Notes (Signed)
Pre visit review using our clinic review tool, if applicable. No additional management support is needed unless otherwise documented below in the visit note.   Chief Complaint  Patient presents with  . Annual Exam    HPI: Patient comes in today for Preventive Medicare wellness visit . No major injuries, ed visits ,hospitalizations , new medications since last visit. Back still problematic cause of back to see NS  Seeing dr D for neuropathy sleep  Cognitive impairment .  Had sleep study done Mood ok on cymbalta some ocass down days  Still tobacco no cp sob  Cancer unknown primary   Stable sees der Ennever about every 3 months missed last labs    Health Maintenance  Topic Date Due  . Influenza Vaccine  12/11/2013  . Mammogram  07/08/2015  . Colonoscopy  12/05/2020  . Tetanus/tdap  11/03/2023   Health Maintenance Review LIFESTYLE:  Exercise:  Limited by back and neuropathy  Tobacco/ETS:  Yes  1 ppd  Alcohol: rare.  Sugar beverages: coffee   Sleep:   On new rx  ambien   Drug use: no Colonoscopy: utd  Hearing: menieres    hard to localize sounds   Vision:  No limitations at present . Last eye check UTD glasses and dry eye.   Safety:  Has smoke detector and wears seat belts.  No firearms. No excess sun exposure. Sees dentist regularly.  Falls:  Some  From neuropathy tripos neuropathy b  Advance directive :  Reviewed  Has one.  Memory: decreased  See neurocognitive assessment   Depression: No anhedonia unusual crying or depressive symptoms  Nutrition: Eats well balanced diet; adequate calcium and vitamin D. No swallowing chewing problems.  Injury: no major injuries in the last six months.   Other healthcare providers:  Reviewed today .  Social:  Lives alone  Dogs  pets.   Preventive parameters: up-to-date  Reviewed   ADLS:   There are no problems or need for assistance  driving, feeding, obtaining food, dressing, toileting and bathing, managing money using phone.  She is independent. Has to do executive planning and  Fall prevention     ROS:  GEN/ HEENT: No fever, significant weight changes sweats headaches vision problems hearing changes, CV/ PULM; No chest pain shortness of breath cough, syncope,edema  change in exercise tolerance. GI /GU: No adominal pain, vomiting, change in bowel habits. No blood in the stool. No significant GU symptoms. SKIN/HEME: ,no acute skin rashes suspicious lesions or bleeding. No lymphadenopathy, nodules, masses.  To get fu skin center for skin lesions cancer NEURO/ PSYCH:   Peripheral neuropathy getting worse  Some callouses and cracks from walking in [place] .  IMM/ Allergy: No unusual infections.  Allergy .   REST of 12 system review negative except as per HPI   Past Medical History  Diagnosis Date  . Hx pulmonary embolism   . Obesity   . Metrorrhagia   . Allergic rhinitis   . Iron deficiency anemia   . Hypertension   . Alopecia   . Vitamin D deficiency   . HLD (hyperlipidemia)   . Adjustment disorder   . Anxiety and depression   . Raynaud's syndrome   . Right groin mass   . 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 Squamous cell carcinoma of skin   . Ovarian cancer 48  . Vision abnormalities   . Neuropathy   . 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 Ovarian cancer  genetic susceptibility 01/27/2013    Family History  Problem Relation Age of Onset  . Pulmonary embolism Mother   . Hypertension Father   . Breast cancer Maternal Grandmother     diagnosed in early 69s  . Hyperlipidemia      History   Social History  . Marital Status: Divorced    Spouse Name: N/A    Number of Children: 1  . Years of Education: Master's   Occupational History  .      disabled   Social History Main Topics  . Smoking status: Current Every Day Smoker -- 0.50 packs/day for 37 years    Types: Cigarettes    Start date: 06/16/1978  . Smokeless tobacco: Never Used     Comment: 1/2 pack daily  .  Alcohol Use: Yes     Comment: rare, 3 weekly  . Drug Use: No  . Sexual Activity: None   Other Topics Concern  . None   Social History Narrative   Divorced   1 son /1 adopted adult son  2 dogs    Sales occupation on disability for now   Regular exercise-yes money stress   Daily caffeine use 2-3 and 2 cups of coffee   On since a security disability since 2012   Patient is right-handed.   Patient has a Master's degree.    Outpatient Encounter Prescriptions as of 11/02/2013  Medication Sig  . Carboxymethylcellulose Sodium (REFRESH LIQUIGEL OP) Apply 1 drop to eye 2 (two) times daily.  Marland Kitchen donepezil (ARICEPT) 10 MG tablet TAKE 1 TABLET BY MOUTH EVERY MORNING  . DULoxetine (CYMBALTA) 60 MG capsule TAKE ONE CAPSULE BY MOUTH DAILY  . Multiple Vitamin (MULTIVITAMIN WITH MINERALS) TABS tablet Take 1 tablet by mouth daily.  Marland Kitchen omega-3 acid ethyl esters (LOVAZA) 1 G capsule Take 1 g by mouth daily.  . orphenadrine (NORFLEX) 100 MG tablet TAKE 1 TABLET BY MOUTH TWICE DAILY AS NEEDED FOR MUSCLE SPASMS  . potassium chloride (K-DUR,KLOR-CON) 10 MEQ tablet Take 20 mEq by mouth every morning.  . pregabalin (LYRICA) 150 MG capsule Take 1 capsule (150 mg total) by mouth 2 (two) times daily.  . simvastatin (ZOCOR) 40 MG tablet TAKE 1 TABLET BY MOUTH DAILY  . triamterene-hydrochlorothiazide (MAXZIDE-25) 37.5-25 MG per tablet TAKE 2 TABLETS BY MOUTH ONCE DAILY  . Vitamin D, Ergocalciferol, (DRISDOL) 50000 UNITS CAPS capsule TAKE 1 CAPSULE BY MOUTH EVERY WEEK  . zolpidem (AMBIEN CR) 12.5 MG CR tablet Take 1 tablet (12.5 mg total) by mouth at bedtime.  . [DISCONTINUED] donepezil (ARICEPT) 10 MG tablet Take 10 mg by mouth every morning.     EXAM:  BP 108/80  Temp(Src) 97.8 F (36.6 C) (Oral)  Ht 5' 8.5" (1.74 m)  Wt 241 lb (109.317 kg)  BMI 36.11 kg/m2  Body mass index is 36.11 kg/(m^2).  Physical Exam: Vital signs reviewed ERD:EYCX is a well-developed well-nourished alert cooperative   who  appears stated age in no acute distress.  HEENT: normocephalic atraumatic , Eyes: PERRL EOM's full, conjunctiva clear, Nares: paten,t no deformity discharge or tenderness., Ears: no deformity EAC's clear TMs with normal landmarks. Mouth: clear OP, no lesions, edema.  Moist mucous membranes. Dentition in adequate repair. NECK: supple without masses,  or bruits. CHEST/PULM:  Clear to auscultation and percussion breath sounds equal no wheeze , rales or rhonchi. No chest wall deformities or tenderness. Breast surgery scars no  obv masses  CV: PMI is nondisplaced, S1 S2 no gallops, murmurs, rubs. Peripheral  pulses are full without delay.No JVD .  ABDOMEN: Bowel sounds normal nontender  No guard or rebound, no hepato splenomegal no CVA tenderness.  No hernia. Extremtities:  No clubbing cyanosis or edema, no acute joint swelling or redness no focal atrophy left pinky toenail traumatized? NEURO:  Oriented x3, cranial nerves 3-12 appear to be intact, no obvious focal weakness,gait  A bit flat footed SKIN: No acute rashes normal turgor, color, no bruising or petechiae. Feet pulses intact few calloused and  excoriation left lateral foot  PSYCH: Oriented, good eye contact, no obvious depression anxiety, cognition and judgment appear normal. LN: no cervical axillary inguinal adenopathy No noted deficits in memory, attention, and speech. 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 - problematic  disc foot care and fall risk - Plan: Comprehensive metabolic panel, Lactate Dehydrogenase, CBC with Differential, Lipid  panel, TSH  Unspecified essential hypertension - Plan: Comprehensive metabolic panel, Lactate Dehydrogenase, CBC with Differential, Lipid panel, TSH  Other malignant neoplasm without specification of site - Plan: Comprehensive metabolic panel, Lactate Dehydrogenase, CBC with Differential, Lipid panel, TSH  Hyperlipidemia - Plan: Comprehensive metabolic panel, Lactate Dehydrogenase, CBC with Differential, Lipid panel, TSH  Other iron deficiency anemias - Plan: Comprehensive metabolic panel, Lactate Dehydrogenase, CBC with Differential, Lipid panel, TSH  Need for vaccination with 13-polyvalent pneumococcal conjugate vaccine - Plan: Pneumococcal conjugate vaccine 13-valent  BRCA1 positive  Neuropathy due to chemotherapeutic drug  Cognitive impairment  Left-sided low back pain with left-sided sciatica  TOBACCO USE - advise dc  Hx of ovarian cancer ?   Obesity (BMI 30-39.9) Counseled regarding healthy nutrition, exercise, sleep, injury prevention, calcium vit d and healthy weight . Also may consider seeing podiatry and need good care for feet   Patient Care Team: Burnis Medin, MD as PCP - General (Internal Medicine) Imogene Burn. Georgette Dover, MD (General Surgery) Gatha Mayer, MD (Gastroenterology) Tyrone Sage, PsyD (Psychiatry) Adam Phenix, MD (Obstetrics and Gynecology) [name], MD as Attending Physician (Internal Medicine) Larey Seat, MD (Neurology)  Patient Instructions  Labs today  Will get results to dr E. And dr D Try to stop tobacco. Any activity is good  prevnar 13 today  Can get prevnar  Future time .     Standley Brooking. Panosh M.D.

## 2013-11-03 ENCOUNTER — Telehealth: Payer: Self-pay | Admitting: Internal Medicine

## 2013-11-03 ENCOUNTER — Telehealth: Payer: Self-pay | Admitting: Hematology & Oncology

## 2013-11-03 LAB — LACTATE DEHYDROGENASE: LDH: 160 U/L (ref 94–250)

## 2013-11-03 NOTE — Telephone Encounter (Signed)
Pt left message to reschedule 6-3. I left her message to call

## 2013-11-03 NOTE — Telephone Encounter (Signed)
Relevant patient education assigned to patient using Emmi. ° °

## 2013-11-03 NOTE — Telephone Encounter (Signed)
Pt called made 6-25 flush and 7-24 MD appointments. She had lab drawn at PCP, RN has results

## 2013-11-04 ENCOUNTER — Ambulatory Visit (HOSPITAL_BASED_OUTPATIENT_CLINIC_OR_DEPARTMENT_OTHER): Payer: Medicare Other

## 2013-11-04 VITALS — BP 120/56 | HR 89 | Temp 97.9°F

## 2013-11-04 DIAGNOSIS — C801 Malignant (primary) neoplasm, unspecified: Secondary | ICD-10-CM

## 2013-11-04 DIAGNOSIS — Z452 Encounter for adjustment and management of vascular access device: Secondary | ICD-10-CM

## 2013-11-04 MED ORDER — SODIUM CHLORIDE 0.9 % IJ SOLN
10.0000 mL | INTRAMUSCULAR | Status: DC | PRN
  Administered 2013-11-04: 10 mL via INTRAVENOUS
  Filled 2013-11-04: qty 10

## 2013-11-04 MED ORDER — HEPARIN SOD (PORK) LOCK FLUSH 100 UNIT/ML IV SOLN
500.0000 [IU] | Freq: Once | INTRAVENOUS | Status: AC | PRN
  Administered 2013-11-04: 500 [IU] via INTRAVENOUS
  Filled 2013-11-04: qty 5

## 2013-11-05 ENCOUNTER — Other Ambulatory Visit: Payer: Self-pay | Admitting: Hematology & Oncology

## 2013-11-05 NOTE — Sleep Study (Signed)
See media tab for full report  

## 2013-11-17 ENCOUNTER — Telehealth: Payer: Self-pay | Admitting: Neurology

## 2013-11-17 ENCOUNTER — Encounter: Payer: Self-pay | Admitting: *Deleted

## 2013-11-17 DIAGNOSIS — R0902 Hypoxemia: Secondary | ICD-10-CM

## 2013-11-17 NOTE — Telephone Encounter (Signed)
I called and spoke with the patient about her recent home sleep test results. I informed the patient that the study did not reveal apnea, but she had low oxygen level (hypoxemia). Dr. Brett Fairy recommend oxygen at night for prolonged hypoxemia. I informed the patient that I will check with [facility] to see if your insurance carrier will cover, if not she may be scheduled for in lab test. I will send a copy of the report to Dr. Shanon Ace and mail a copy to the patient.

## 2013-11-21 ENCOUNTER — Other Ambulatory Visit: Payer: Self-pay | Admitting: Internal Medicine

## 2013-11-23 NOTE — Telephone Encounter (Signed)
Ok to refill lyrica for 6 months maxide for 1 year.

## 2013-11-25 NOTE — Telephone Encounter (Signed)
Maxide sent by e-scribe and Lyrica called and left on voicemail.

## 2013-12-03 ENCOUNTER — Other Ambulatory Visit (HOSPITAL_BASED_OUTPATIENT_CLINIC_OR_DEPARTMENT_OTHER): Payer: Medicare Other | Admitting: Lab

## 2013-12-03 ENCOUNTER — Encounter: Payer: Self-pay | Admitting: Hematology & Oncology

## 2013-12-03 ENCOUNTER — Telehealth: Payer: Self-pay | Admitting: *Deleted

## 2013-12-03 ENCOUNTER — Other Ambulatory Visit: Payer: Self-pay | Admitting: Hematology & Oncology

## 2013-12-03 ENCOUNTER — Ambulatory Visit (HOSPITAL_BASED_OUTPATIENT_CLINIC_OR_DEPARTMENT_OTHER): Payer: Medicare Other | Admitting: Hematology & Oncology

## 2013-12-03 VITALS — BP 119/90 | HR 77 | Temp 97.9°F | Resp 14 | Ht 68.0 in | Wt 238.0 lb

## 2013-12-03 DIAGNOSIS — C569 Malignant neoplasm of unspecified ovary: Secondary | ICD-10-CM

## 2013-12-03 DIAGNOSIS — Z1501 Genetic susceptibility to malignant neoplasm of breast: Secondary | ICD-10-CM

## 2013-12-03 DIAGNOSIS — G589 Mononeuropathy, unspecified: Secondary | ICD-10-CM

## 2013-12-03 DIAGNOSIS — Z1502 Genetic susceptibility to malignant neoplasm of ovary: Secondary | ICD-10-CM

## 2013-12-03 DIAGNOSIS — Z1509 Genetic susceptibility to other malignant neoplasm: Secondary | ICD-10-CM

## 2013-12-03 DIAGNOSIS — Z85028 Personal history of other malignant neoplasm of stomach: Secondary | ICD-10-CM

## 2013-12-03 DIAGNOSIS — E049 Nontoxic goiter, unspecified: Secondary | ICD-10-CM

## 2013-12-03 DIAGNOSIS — C801 Malignant (primary) neoplasm, unspecified: Secondary | ICD-10-CM

## 2013-12-03 LAB — CBC WITH DIFFERENTIAL (CANCER CENTER ONLY)
BASO#: 0 10*3/uL (ref 0.0–0.2)
BASO%: 0.4 % (ref 0.0–2.0)
EOS%: 2.2 % (ref 0.0–7.0)
Eosinophils Absolute: 0.1 10*3/uL (ref 0.0–0.5)
HCT: 40.9 % (ref 34.8–46.6)
HGB: 13.8 g/dL (ref 11.6–15.9)
LYMPH#: 1.5 10*3/uL (ref 0.9–3.3)
LYMPH%: 27.6 % (ref 14.0–48.0)
MCH: 30.3 pg (ref 26.0–34.0)
MCHC: 33.7 g/dL (ref 32.0–36.0)
MCV: 90 fL (ref 81–101)
MONO#: 0.4 10*3/uL (ref 0.1–0.9)
MONO%: 7.4 % (ref 0.0–13.0)
NEUT#: 3.5 10*3/uL (ref 1.5–6.5)
NEUT%: 62.4 % (ref 39.6–80.0)
PLATELETS: 211 10*3/uL (ref 145–400)
RBC: 4.56 10*6/uL (ref 3.70–5.32)
RDW: 14.1 % (ref 11.1–15.7)
WBC: 5.6 10*3/uL (ref 3.9–10.0)

## 2013-12-03 LAB — COMPREHENSIVE METABOLIC PANEL
ALK PHOS: 86 U/L (ref 39–117)
ALT: 27 U/L (ref 0–35)
AST: 26 U/L (ref 0–37)
Albumin: 3.7 g/dL (ref 3.5–5.2)
BILIRUBIN TOTAL: 0.4 mg/dL (ref 0.2–1.2)
BUN: 16 mg/dL (ref 6–23)
CO2: 28 mEq/L (ref 19–32)
Calcium: 9 mg/dL (ref 8.4–10.5)
Chloride: 106 mEq/L (ref 96–112)
Creatinine, Ser: 1.04 mg/dL (ref 0.50–1.10)
GLUCOSE: 105 mg/dL — AB (ref 70–99)
POTASSIUM: 3.8 meq/L (ref 3.5–5.3)
Sodium: 139 mEq/L (ref 135–145)
TOTAL PROTEIN: 6.5 g/dL (ref 6.0–8.3)

## 2013-12-03 LAB — LACTATE DEHYDROGENASE: LDH: 190 U/L (ref 94–250)

## 2013-12-03 NOTE — Telephone Encounter (Signed)
na

## 2013-12-06 ENCOUNTER — Telehealth: Payer: Self-pay | Admitting: Hematology & Oncology

## 2013-12-06 ENCOUNTER — Telehealth: Payer: Self-pay | Admitting: *Deleted

## 2013-12-06 NOTE — Progress Notes (Signed)
Hematology and Oncology Follow Up Visit  [name] [id number] [birth year] 53 y.o. 12/06/2013   Principle Diagnosis:  1. 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 2. BRCA1 positive. 3. Severe chemotherapy-induced neuropathy. 4. Intermittent iron-deficiency anemia.  Current Therapy:    Observation     Interim History:  [name] is back for followup. She actually looks quite good. She is doing okay. She can't lose some weight. She can't exercise.  She still has a neuropathy. I think this will be a problem for a long term..  She's a well. She's had no nausea vomiting. She's had no cough.  Her last mammogram was done in February had this looked okay. Next year, we'll have to set her up with an MRI.  She's had no rashes. She's had no headache. There's been no visual issues.  She's had no change in her medications.  Medications: Current outpatient prescriptions:aspirin 81 MG tablet, Take 81 mg by mouth daily., Disp: , Rfl: ;  donepezil (ARICEPT) 10 MG tablet, TAKE 1 TABLET BY MOUTH EVERY MORNING, Disp: 90 tablet, Rfl: 0;  DULoxetine (CYMBALTA) 60 MG capsule, TAKE 1 CAPSULE BY MOUTH DAILY, Disp: 30 capsule, Rfl: 0;  LYRICA 150 MG capsule, TAKE 1 CAPSULE BY MOUTH TWICE DAILY, Disp: 60 capsule, Rfl: 5 Multiple Vitamin (MULTIVITAMIN WITH MINERALS) TABS tablet, Take 1 tablet by mouth daily., Disp: , Rfl: ;  omega-3 acid ethyl esters (LOVAZA) 1 G capsule, Take 1 g by mouth daily., Disp: , Rfl: ;  orphenadrine (NORFLEX) 100 MG tablet, TAKE 1 TABLET BY MOUTH TWICE DAILY AS NEEDED FOR MUSCLE SPASMS, Disp: 60 tablet, Rfl: 2;  potassium chloride (K-DUR,KLOR-CON) 10 MEQ tablet, Take 20 mEq by mouth every morning., Disp: , Rfl:  simvastatin (ZOCOR) 40 MG tablet, TAKE 1 TABLET BY MOUTH DAILY, Disp: 30 tablet, Rfl: 2;  triamterene-hydrochlorothiazide (MAXZIDE-25) 37.5-25 MG per tablet, TAKE 2 TABLET BY MOUTH EVERY DAY, Disp: 60 tablet, Rfl: 11;  Vitamin D, Ergocalciferol, (DRISDOL)  50000 UNITS CAPS capsule, TAKE 1 CAPSULE BY MOUTH EVERY WEEK, Disp: 12 capsule, Rfl: 0 zolpidem (AMBIEN CR) 12.5 MG CR tablet, Take 1 tablet (12.5 mg total) by mouth at bedtime., Disp: 30 tablet, Rfl: 5  Allergies:  Allergies  Allergen Reactions  . Codeine     REACTION: Nausea  . Orange Itching and Rash    Past Medical History, Surgical history, Social history, and Family History were reviewed and updated.  Review of Systems: As above  Physical Exam:  height is _0  (1.727 m) and weight is 238 lb (107.956 kg). Her oral temperature is 97.9 F (36.6 C). Her blood pressure is 119/90 and her pulse is 77. Her respiration is 14.   Obese Afro-American female in no obvious distress. Head and neck exam shows no ocular or oral lesions. Shows no pulsatile cervical or supraclavicular lymph nodes. Lungs are clear. Cardiac exam regular in rhythm with no murmurs rubs or bruits. Abdomen is soft tissues good bowel sounds. There is no fluid wave. There is no palpable liver or spleen tip. She has well-healed laparotomy scar. Back exam no tenderness over the spine ribs or hips. Extremities shows no clubbing cyanosis or edema. Neurological exam shows no focal neurological deficits. She does have some increased sensitivity to touch in her feet. Skin exam no rashes.  Lab Results  Component Value Date   WBC 5.6 12/03/2013   HGB 13.8 12/03/2013   HCT 40.9 12/03/2013   MCV 90 12/03/2013   PLT 211 12/03/2013  Chemistry      Component Value Date/Time   NA 139 12/03/2013 1449   NA 139 12/22/2008 1354   K 3.8 12/03/2013 1449   K 4.0 12/22/2008 1354   CL 106 12/03/2013 1449   CL 108 12/22/2008 1354   CO2 28 12/03/2013 1449   CO2 28 12/22/2008 1354   BUN 16 12/03/2013 1449   BUN 15 12/22/2008 1354   CREATININE 1.04 12/03/2013 1449   CREATININE 0.9 12/22/2008 1354      Component Value Date/Time   CALCIUM 9.0 12/03/2013 1449   CALCIUM 10.1 12/22/2008 1354   ALKPHOS 86 12/03/2013 1449   ALKPHOS 72 12/22/2008 1354    AST 26 12/03/2013 1449   AST 35 12/22/2008 1354   ALT 27 12/03/2013 1449   ALT 26 12/22/2008 1354   BILITOT 0.4 12/03/2013 1449   BILITOT 0.60 12/22/2008 1354         Impression and Plan: [name] is 53 year old African American female with a history of a poorly differentiated carcinoma of the abdomen. I didn't molecular assay studies on this. It was felt to be overbearing. She is BRCA one positive. She is in remission. She had 6 cycles of chemotherapy with carboplatinum and Taxotere.. She's been in remission now for over 5 years.  From my point of view, I don't see any need for additional studies.  We will plan to see her back in another 4 months.   [name], MD 7/27/20157:06 AM

## 2013-12-06 NOTE — Telephone Encounter (Signed)
Mailed September and November schedule.

## 2013-12-06 NOTE — Telephone Encounter (Addendum)
Message copied by Lenn Sink on Mon Dec 06, 2013  8:50 AM ------      Message from: [name]      Created: Sun Dec 05, 2013  8:23 PM       Call - labs are ok!! [name] ------Left voicemail informing pt that labs are okay.

## 2013-12-20 ENCOUNTER — Other Ambulatory Visit: Payer: Self-pay | Admitting: Internal Medicine

## 2013-12-20 NOTE — Telephone Encounter (Signed)
Sent to the pharmacy by e-scribe.  Pt to return 10/2014.

## 2014-01-11 ENCOUNTER — Encounter: Payer: Self-pay | Admitting: Internal Medicine

## 2014-01-17 ENCOUNTER — Other Ambulatory Visit: Payer: Self-pay | Admitting: Hematology & Oncology

## 2014-01-17 ENCOUNTER — Other Ambulatory Visit: Payer: Self-pay | Admitting: Internal Medicine

## 2014-01-18 NOTE — Telephone Encounter (Signed)
Sent to the pharmacy by e-scribe. 

## 2014-01-18 NOTE — Telephone Encounter (Signed)
Ok to refill x 2  

## 2014-01-24 ENCOUNTER — Encounter: Payer: Self-pay | Admitting: Internal Medicine

## 2014-01-24 ENCOUNTER — Ambulatory Visit (INDEPENDENT_AMBULATORY_CARE_PROVIDER_SITE_OTHER): Payer: Medicare Other | Admitting: Internal Medicine

## 2014-01-24 VITALS — BP 130/84 | HR 83 | Temp 98.4°F | Wt 233.0 lb

## 2014-01-24 DIAGNOSIS — J069 Acute upper respiratory infection, unspecified: Secondary | ICD-10-CM

## 2014-01-24 DIAGNOSIS — G5790 Unspecified mononeuropathy of unspecified lower limb: Secondary | ICD-10-CM

## 2014-01-24 DIAGNOSIS — J40 Bronchitis, not specified as acute or chronic: Secondary | ICD-10-CM

## 2014-01-24 DIAGNOSIS — F17200 Nicotine dependence, unspecified, uncomplicated: Secondary | ICD-10-CM

## 2014-01-24 DIAGNOSIS — M792 Neuralgia and neuritis, unspecified: Secondary | ICD-10-CM

## 2014-01-24 MED ORDER — AZITHROMYCIN 250 MG PO TABS: 250.0000 mg | ORAL_TABLET | ORAL | Status: DC

## 2014-01-24 NOTE — Progress Notes (Signed)
Pre visit review using our clinic review tool, if applicable. No additional management support is needed unless otherwise documented below in the visit note.   Chief Complaint  Patient presents with  . Cough    Cough is productive of a brown/green sputum.  Pt says it hurts when she takes a deep breath.    HPI: Patient [name]  comes in today for SDA for  new problem evaluation. On set likea  Virus head cold  And now going on 2 weeks  And  Now  A bit better  Like a cold then relapsed felldown with dizziness  And tried dimetapp however ears are stuffed up and  equilibirtum still a bit off  Hurts very bad  In throat in chest.  Hurts to breath very sore neck and upper chest  Coughing up brown  And green  No acute blood   1ppd  Tobacco Pre sick  ROS: See pertinent positives and negatives per HPI. No fever now has months of sensory pai medial left thigh without rash  No lesion .different than there  Other neuropathy. Is not allergic cto codeine just gets nausea with it     Past Medical History  Diagnosis Date  . Hx pulmonary embolism   . Obesity   . Metrorrhagia   . Allergic rhinitis   . Iron deficiency anemia   . Hypertension   . Alopecia   . Vitamin D deficiency   . HLD (hyperlipidemia)   . Adjustment disorder   . Anxiety and depression   . Raynaud's syndrome   . Right groin mass   . 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 Squamous cell carcinoma of skin   . Ovarian cancer 48  . Vision abnormalities   . Neuropathy   . 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    Family History  Problem Relation Age of Onset  . Pulmonary embolism Mother   . Hypertension Father   . Breast cancer Maternal Grandmother     diagnosed in early 87s  . Hyperlipidemia      History   Social History  . Marital Status: Divorced    Spouse Name: N/A    Number of Children: 1  . Years of Education: Master's   Occupational  History  .      disabled   Social History Main Topics  . Smoking status: Current Every Day Smoker -- 0.50 packs/day for 37 years    Types: Cigarettes    Start date: 06/16/1978  . Smokeless tobacco: Never Used     Comment: still smoking 1/2 pack daily  . Alcohol Use: Yes     Comment: rare, 3 weekly  . Drug Use: No  . Sexual Activity: Not on file   Other Topics Concern  . Not on file   Social History Narrative   Divorced   1 son /1 adopted adult son  2 dogs    Sales occupation on disability for now   Regular exercise-yes money stress   Daily caffeine use 2-3 and 2 cups of coffee   On since a security disability since 2012   Patient is right-handed.   Patient has a Master's degree.    Outpatient Encounter Prescriptions as of 01/24/2014  Medication Sig  . aspirin 81 MG tablet Take 81 mg by mouth daily.  Marland Kitchen donepezil (ARICEPT) 10 MG tablet TAKE 1 TABLET BY MOUTH EVERY MORNING  . DULoxetine (CYMBALTA) 60  MG capsule TAKE ONE CAPSULE BY MOUTH DAILY  . LYRICA 150 MG capsule TAKE 1 CAPSULE BY MOUTH TWICE DAILY  . Multiple Vitamin (MULTIVITAMIN WITH MINERALS) TABS tablet Take 1 tablet by mouth daily.  Marland Kitchen omega-3 acid ethyl esters (LOVAZA) 1 G capsule Take 1 g by mouth daily.  . orphenadrine (NORFLEX) 100 MG tablet TAKE 1 TABLET BY MOUTH TWICE DAILY AS NEEDED FOR MUSCLE SPASMS  . potassium chloride (K-DUR) 10 MEQ tablet TAKE 2 TABLETS BY MOUTH DAILY  . potassium chloride (K-DUR,KLOR-CON) 10 MEQ tablet Take 20 mEq by mouth every morning.  . simvastatin (ZOCOR) 40 MG tablet TAKE 1 TABLET BY MOUTH EVERY DAY  . triamterene-hydrochlorothiazide (MAXZIDE-25) 37.5-25 MG per tablet TAKE 2 TABLET BY MOUTH EVERY DAY  . Vitamin D, Ergocalciferol, (DRISDOL) 50000 UNITS CAPS capsule TAKE 1 CAPSULE BY MOUTH ONCE WEEKLY  . zolpidem (AMBIEN CR) 12.5 MG CR tablet Take 1 tablet (12.5 mg total) by mouth at bedtime.  Marland Kitchen azithromycin (ZITHROMAX Z-PAK) 250 MG tablet Take 1 tablet (250 mg total) by mouth as  directed. Take 2 po first day, then 1 po qd    EXAM:  BP 130/84  Pulse 83  Temp(Src) 98.4 F (36.9 C) (Oral)  Wt 233 lb (105.688 kg)  SpO2 97%  Body mass index is 35.44 kg/(m^2).  GENERAL: vitals reviewed and listed above, alert, oriented, appears well hydrated and in no acute distress cough  Non toxic  HEENT: atraumatic, conjunctiva  clear, no obvious abnormalities on inspection of external nose and ears tms clear face non tender nasre congsted  OP : no lesion edema or exudate  P NECK: no obvious masses on inspection palpation  Tender uperr and lateral nnode area  Mild hoarse no stridor  Or drooling  LUNGS: clear to auscultation bilaterally, no wheezes, rales or rhonchi,  CV: HRRR, no clubbing cyanosis or  peripheral edema nl cap refill  MS: moves all extremities without noticeable focal  Abnormality ;left medial  thigh no lesion round area of hyperesthesia  PSYCH: pleasant and cooperative, no obvious depression or anxiety  ASSESSMENT AND PLAN:  Discussed the following assessment and plan:  Protracted URI  Bronchitis with tracheitis ? - very sore but no obs sx  worsening relapsing after 2 weeks ? seondary infection but no fever   TOBACCO USE - please stop tobacco!  Leg neuralgia, left - local ? cause   -Patient advised to return or notify health care team  if symptoms worsen ,persist or new concerns arise.  Patient Instructions  Over 2 weeks   Illness treat for bacterial respiratory infection . Please stop tobacco as this is contributing. To the problem .   Standley Brooking. Panosh M.D.

## 2014-01-24 NOTE — Patient Instructions (Signed)
Over 2 weeks   Illness treat for bacterial respiratory infection . Please stop tobacco as this is contributing. To the problem .

## 2014-01-28 ENCOUNTER — Telehealth: Payer: Self-pay | Admitting: Hematology & Oncology

## 2014-01-28 NOTE — Telephone Encounter (Signed)
Patient called and cx 01/28/14 apt due to being sick.  She stated she would call back to resch when she is feeling better

## 2014-02-18 ENCOUNTER — Other Ambulatory Visit: Payer: Self-pay | Admitting: Hematology & Oncology

## 2014-02-18 ENCOUNTER — Other Ambulatory Visit: Payer: Self-pay | Admitting: Neurology

## 2014-03-09 ENCOUNTER — Other Ambulatory Visit: Payer: Self-pay | Admitting: Nurse Practitioner

## 2014-03-21 ENCOUNTER — Other Ambulatory Visit: Payer: Self-pay | Admitting: Internal Medicine

## 2014-03-21 ENCOUNTER — Other Ambulatory Visit: Payer: Self-pay | Admitting: Hematology & Oncology

## 2014-03-21 ENCOUNTER — Other Ambulatory Visit: Payer: Self-pay | Admitting: *Deleted

## 2014-03-21 MED ORDER — DULOXETINE HCL 60 MG PO CPEP: 60.0000 mg | ORAL_CAPSULE | Freq: Every day | ORAL | Status: DC

## 2014-03-22 NOTE — Telephone Encounter (Signed)
Sent to the pharmacy by e-scribe. 

## 2014-03-22 NOTE — Telephone Encounter (Signed)
Ok x 1

## 2014-03-25 ENCOUNTER — Ambulatory Visit (HOSPITAL_BASED_OUTPATIENT_CLINIC_OR_DEPARTMENT_OTHER): Payer: Medicare Other | Admitting: Hematology & Oncology

## 2014-03-25 ENCOUNTER — Ambulatory Visit: Payer: Medicare Other

## 2014-03-25 ENCOUNTER — Encounter: Payer: Self-pay | Admitting: Hematology & Oncology

## 2014-03-25 ENCOUNTER — Other Ambulatory Visit (HOSPITAL_BASED_OUTPATIENT_CLINIC_OR_DEPARTMENT_OTHER): Payer: Medicare Other | Admitting: Lab

## 2014-03-25 VITALS — BP 133/82 | HR 83 | Temp 98.1°F | Resp 14 | Ht 68.0 in | Wt 235.0 lb

## 2014-03-25 DIAGNOSIS — D500 Iron deficiency anemia secondary to blood loss (chronic): Secondary | ICD-10-CM

## 2014-03-25 DIAGNOSIS — G620 Drug-induced polyneuropathy: Secondary | ICD-10-CM

## 2014-03-25 DIAGNOSIS — Z1501 Genetic susceptibility to malignant neoplasm of breast: Secondary | ICD-10-CM

## 2014-03-25 DIAGNOSIS — Z1502 Genetic susceptibility to malignant neoplasm of ovary: Secondary | ICD-10-CM

## 2014-03-25 DIAGNOSIS — Z859 Personal history of malignant neoplasm, unspecified: Secondary | ICD-10-CM

## 2014-03-25 DIAGNOSIS — Z1509 Genetic susceptibility to other malignant neoplasm: Secondary | ICD-10-CM

## 2014-03-25 DIAGNOSIS — R29818 Other symptoms and signs involving the nervous system: Secondary | ICD-10-CM

## 2014-03-25 DIAGNOSIS — C801 Malignant (primary) neoplasm, unspecified: Secondary | ICD-10-CM

## 2014-03-25 DIAGNOSIS — R4189 Other symptoms and signs involving cognitive functions and awareness: Secondary | ICD-10-CM

## 2014-03-25 LAB — CBC WITH DIFFERENTIAL (CANCER CENTER ONLY)
BASO#: 0 10*3/uL (ref 0.0–0.2)
BASO%: 0.4 % (ref 0.0–2.0)
EOS%: 1.8 % (ref 0.0–7.0)
Eosinophils Absolute: 0.1 10*3/uL (ref 0.0–0.5)
HCT: 39.7 % (ref 34.8–46.6)
HGB: 13.4 g/dL (ref 11.6–15.9)
LYMPH#: 1.7 10*3/uL (ref 0.9–3.3)
LYMPH%: 30.3 % (ref 14.0–48.0)
MCH: 29.8 pg (ref 26.0–34.0)
MCHC: 33.8 g/dL (ref 32.0–36.0)
MCV: 88 fL (ref 81–101)
MONO#: 0.4 10*3/uL (ref 0.1–0.9)
MONO%: 6.4 % (ref 0.0–13.0)
NEUT#: 3.5 10*3/uL (ref 1.5–6.5)
NEUT%: 61.1 % (ref 39.6–80.0)
Platelets: 240 10*3/uL (ref 145–400)
RBC: 4.49 10*6/uL (ref 3.70–5.32)
RDW: 15.1 % (ref 11.1–15.7)
WBC: 5.7 10*3/uL (ref 3.9–10.0)

## 2014-03-25 LAB — COMPREHENSIVE METABOLIC PANEL
ALK PHOS: 95 U/L (ref 39–117)
ALT: 26 U/L (ref 0–35)
AST: 24 U/L (ref 0–37)
Albumin: 3.7 g/dL (ref 3.5–5.2)
BUN: 17 mg/dL (ref 6–23)
CO2: 26 mEq/L (ref 19–32)
Calcium: 9.5 mg/dL (ref 8.4–10.5)
Chloride: 105 mEq/L (ref 96–112)
Creatinine, Ser: 1.2 mg/dL — ABNORMAL HIGH (ref 0.50–1.10)
GLUCOSE: 109 mg/dL — AB (ref 70–99)
POTASSIUM: 3.8 meq/L (ref 3.5–5.3)
SODIUM: 138 meq/L (ref 135–145)
TOTAL PROTEIN: 6.5 g/dL (ref 6.0–8.3)
Total Bilirubin: 0.3 mg/dL (ref 0.2–1.2)

## 2014-03-25 MED ORDER — SODIUM CHLORIDE 0.9 % IJ SOLN
10.0000 mL | INTRAMUSCULAR | Status: DC | PRN
  Administered 2014-03-25: 10 mL via INTRAVENOUS
  Filled 2014-03-25: qty 10

## 2014-03-25 MED ORDER — HEPARIN SOD (PORK) LOCK FLUSH 100 UNIT/ML IV SOLN
500.0000 [IU] | Freq: Once | INTRAVENOUS | Status: AC
  Administered 2014-03-25: 500 [IU] via INTRAVENOUS
  Filled 2014-03-25: qty 5

## 2014-03-25 NOTE — Patient Instructions (Signed)
PAC FLUSH

## 2014-03-25 NOTE — Patient Instructions (Signed)
Smoking Cessation Quitting smoking is important to your health and has many advantages. However, it is not always easy to quit since nicotine is a very addictive drug. Oftentimes, people try 3 times or more before being able to quit. This document explains the best ways for you to prepare to quit smoking. Quitting takes hard work and a lot of effort, but you can do it. ADVANTAGES OF QUITTING SMOKING  You will live longer, feel better, and live better.  Your body will feel the impact of quitting smoking almost immediately.  Within 20 minutes, blood pressure decreases. Your pulse returns to its normal level.  After 8 hours, carbon monoxide levels in the blood return to normal. Your oxygen level increases.  After 24 hours, the chance of having a heart attack starts to decrease. Your breath, hair, and body stop smelling like smoke.  After 48 hours, damaged nerve endings begin to recover. Your sense of taste and smell improve.  After 72 hours, the body is virtually free of nicotine. Your bronchial tubes relax and breathing becomes easier.  After 2 to 12 weeks, lungs can hold more air. Exercise becomes easier and circulation improves.  The risk of having a heart attack, stroke, cancer, or lung disease is greatly reduced.  After 1 year, the risk of coronary heart disease is cut in half.  After 5 years, the risk of stroke falls to the same as a nonsmoker.  After 10 years, the risk of lung cancer is cut in half and the risk of other cancers decreases significantly.  After 15 years, the risk of coronary heart disease drops, usually to the level of a nonsmoker.  If you are pregnant, quitting smoking will improve your chances of having a healthy baby.  The people you live with, especially any children, will be healthier.  You will have extra money to spend on things other than cigarettes. QUESTIONS TO THINK ABOUT BEFORE ATTEMPTING TO QUIT You may want to talk about your answers with your  health care provider.  Why do you want to quit?  If you tried to quit in the past, what helped and what did not?  What will be the most difficult situations for you after you quit? How will you plan to handle them?  Who can help you through the tough times? Your family? Friends? A health care provider?  What pleasures do you get from smoking? What ways can you still get pleasure if you quit? Here are some questions to ask your health care provider:  How can you help me to be successful at quitting?  What medicine do you think would be best for me and how should I take it?  What should I do if I need more help?  What is smoking withdrawal like? How can I get information on withdrawal? GET READY  Set a quit date.  Change your environment by getting rid of all cigarettes, ashtrays, matches, and lighters in your home, car, or work. Do not let people smoke in your home.  Review your past attempts to quit. Think about what worked and what did not. GET SUPPORT AND ENCOURAGEMENT You have a better chance of being successful if you have help. You can get support in many ways.  Tell your family, friends, and coworkers that you are going to quit and need their support. Ask them not to smoke around you.  Get individual, group, or telephone counseling and support. Programs are available at local hospitals and health centers. Call   your local health department for information about programs in your area.  Spiritual beliefs and practices may help some smokers quit.  Download a "quit meter" on your computer to keep track of quit statistics, such as how long you have gone without smoking, cigarettes not smoked, and money saved.  Get a self-help book about quitting smoking and staying off tobacco. LEARN NEW SKILLS AND BEHAVIORS  Distract yourself from urges to smoke. Talk to someone, go for a walk, or occupy your time with a task.  Change your normal routine. Take a different route to work.  Drink tea instead of coffee. Eat breakfast in a different place.  Reduce your stress. Take a hot bath, exercise, or read a book.  Plan something enjoyable to do every day. Reward yourself for not smoking.  Explore interactive web-based programs that specialize in helping you quit. GET MEDICINE AND USE IT CORRECTLY Medicines can help you stop smoking and decrease the urge to smoke. Combining medicine with the above behavioral methods and support can greatly increase your chances of successfully quitting smoking.  Nicotine replacement therapy helps deliver nicotine to your body without the negative effects and risks of smoking. Nicotine replacement therapy includes nicotine gum, lozenges, inhalers, nasal sprays, and skin patches. Some may be available over-the-counter and others require a prescription.  Antidepressant medicine helps people abstain from smoking, but how this works is unknown. This medicine is available by prescription.  Nicotinic receptor partial agonist medicine simulates the effect of nicotine in your brain. This medicine is available by prescription. Ask your health care provider for advice about which medicines to use and how to use them based on your health history. Your health care provider will tell you what side effects to look out for if you choose to be on a medicine or therapy. Carefully read the information on the package. Do not use any other product containing nicotine while using a nicotine replacement product.  RELAPSE OR DIFFICULT SITUATIONS Most relapses occur within the first 3 months after quitting. Do not be discouraged if you start smoking again. Remember, most people try several times before finally quitting. You may have symptoms of withdrawal because your body is used to nicotine. You may crave cigarettes, be irritable, feel very hungry, cough often, get headaches, or have difficulty concentrating. The withdrawal symptoms are only temporary. They are strongest  when you first quit, but they will go away within 10-14 days. To reduce the chances of relapse, try to:  Avoid drinking alcohol. Drinking lowers your chances of successfully quitting.  Reduce the amount of caffeine you consume. Once you quit smoking, the amount of caffeine in your body increases and can give you symptoms, such as a rapid heartbeat, sweating, and anxiety.  Avoid smokers because they can make you want to smoke.  Do not let weight gain distract you. Many smokers will gain weight when they quit, usually less than 10 pounds. Eat a healthy diet and stay active. You can always lose the weight gained after you quit.  Find ways to improve your mood other than smoking. FOR MORE INFORMATION  www.smokefree.gov  Document Released: 04/23/2001 Document Revised: 09/13/2013 Document Reviewed: 08/08/2011 [employer] Patient Information 2015 [employer]. This information is not intended to replace advice given to you by your health care provider. Make sure you discuss any questions you have with your health care provider.  

## 2014-03-27 NOTE — Progress Notes (Signed)
Hematology and Oncology Follow Up Visit  [name] [id number] 04-09-1961 53 y.o. 03/27/2014   Principle Diagnosis:  1. 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 2. BRCA1 positive. 3. Severe chemotherapy-induced neuropathy. 4. Intermittent iron-deficiency anemia.  Current Therapy:    Observation     Interim History:  Ms.  [name] is back for followup. She is looking pretty good.  She is still smoking but trying to cut back.  She's not exercising as much as she should. She is trying to watch what she eats.  She has not had any problems with cough. She's had no fever. She's had no change in bowel or bladder habits.  She still has neuropathy. This was be a problem for her. It seems be under fairly good control right now.   she's does have some memory issues. This does seem to be as bad right now.  She has not had a problem with rashes. She has had no significant weight loss or weight gain.  She's had no bleeding.    Medications: Current outpatient prescriptions: aspirin 81 MG tablet, Take 81 mg by mouth daily. 2 TABS IN PM, Disp: , Rfl: ;  donepezil (ARICEPT) 10 MG tablet, TAKE 1 TABLET BY MOUTH EVERY MORNING, Disp: 90 tablet, Rfl: 2;  DULoxetine (CYMBALTA) 60 MG capsule, Take 1 capsule (60 mg total) by mouth daily., Disp: 30 capsule, Rfl: 2;  LYRICA 150 MG capsule, TAKE 1 CAPSULE BY MOUTH TWICE DAILY, Disp: 60 capsule, Rfl: 5 Multiple Vitamin (MULTIVITAMIN WITH MINERALS) TABS tablet, Take 1 tablet by mouth daily., Disp: , Rfl: ;  omega-3 acid ethyl esters (LOVAZA) 1 G capsule, Take 1 g by mouth daily., Disp: , Rfl: ;  orphenadrine (NORFLEX) 100 MG tablet, TAKE 1 TABLET BY MOUTH TWICE DAILY AS NEEDED FOR MUSCLE SPASMS, Disp: 60 tablet, Rfl: 0;  potassium chloride (K-DUR) 10 MEQ tablet, TAKE 2 TABLETS BY MOUTH DAILY, Disp: 60 tablet, Rfl: 8 simvastatin (ZOCOR) 40 MG tablet, TAKE 1 TABLET BY MOUTH EVERY DAY, Disp: 30 tablet, Rfl: 8;  triamterene-hydrochlorothiazide  (MAXZIDE-25) 37.5-25 MG per tablet, TAKE 2 TABLET BY MOUTH EVERY DAY, Disp: 60 tablet, Rfl: 11;  Vitamin D, Ergocalciferol, (DRISDOL) 50000 UNITS CAPS capsule, TAKE 1 CAPSULE BY MOUTH ONCE WEEKLY, Disp: 12 capsule, Rfl: 0 zolpidem (AMBIEN CR) 12.5 MG CR tablet, Take 1 tablet (12.5 mg total) by mouth at bedtime., Disp: 30 tablet, Rfl: 5  Allergies:  Allergies  Allergen Reactions  . Codeine Nausea Only    REACTION: Nausea  . Orange Itching and Rash    Past Medical History, Surgical history, Social history, and Family History were reviewed and updated.  Review of Systems: As above  Physical Exam:  height is 5' 8"  (1.727 m) and weight is 235 lb (106.595 kg). Her oral temperature is 98.1 F (36.7 C). Her blood pressure is 133/82 and her pulse is 83. Her respiration is 14.   Obese African-American female in no obvious distress. Head and neck exam shows no ocular or oral lesions. Shows no pulsatile cervical or supraclavicular lymph nodes. Lungs are clear. Cardiac exam regular rate and rhythm with no murmurs rubs or bruits. Abdomen is soft with good bowel sounds. There is no fluid wave. There is no palpable liver or spleen tip. She has well-healed laparotomy scar. Back exam no tenderness over the spine ribs or hips. Extremities shows no clubbing cyanosis or edema. Neurological exam shows no focal neurological deficits. She does have some increased sensitivity to touch in her feet. Skin exam  no rashes.  Lab Results  Component Value Date   WBC 5.7 03/25/2014   HGB 13.4 03/25/2014   HCT 39.7 03/25/2014   MCV 88 03/25/2014   PLT 240 03/25/2014     Chemistry      Component Value Date/Time   NA 138 03/25/2014 1517   NA 139 12/22/2008 1354   K 3.8 03/25/2014 1517   K 4.0 12/22/2008 1354   CL 105 03/25/2014 1517   CL 108 12/22/2008 1354   CO2 26 03/25/2014 1517   CO2 28 12/22/2008 1354   BUN 17 03/25/2014 1517   BUN 15 12/22/2008 1354   CREATININE 1.20* 03/25/2014 1517   CREATININE 0.9  12/22/2008 1354      Component Value Date/Time   CALCIUM 9.5 03/25/2014 1517   CALCIUM 10.1 12/22/2008 1354   ALKPHOS 95 03/25/2014 1517   ALKPHOS 72 12/22/2008 1354   AST 24 03/25/2014 1517   AST 35 12/22/2008 1354   ALT 26 03/25/2014 1517   ALT 26 12/22/2008 1354   BILITOT 0.3 03/25/2014 1517   BILITOT 0.60 12/22/2008 1354         Impression and Plan: [name] is 53 year old African American female with a history of a poorly differentiated carcinoma of the abdomen. I did do molecular assay studies on this. It was felt to be ovarian cancer in origin.  She is BRCA1 positive. She is in remission. She had 6 cycles of chemotherapy with carboplatinum and Taxotere.. She's been in remission now for over 5 years.  She did develop neuropathy from the chemotherapy. She also probably has developed some memory issues.   From my point of view, I don't see any need for additional studies.  We will plan to see her back in another 4 months.   [name], MD 11/15/20151:53 PM

## 2014-03-28 ENCOUNTER — Telehealth: Payer: Self-pay | Admitting: Hematology & Oncology

## 2014-03-28 NOTE — Telephone Encounter (Signed)
Mailed jan, mar schedule

## 2014-03-30 ENCOUNTER — Telehealth: Payer: Self-pay | Admitting: Neurology

## 2014-03-30 ENCOUNTER — Ambulatory Visit: Payer: Medicare Other | Admitting: Nurse Practitioner

## 2014-04-01 ENCOUNTER — Encounter: Payer: Self-pay | Admitting: *Deleted

## 2014-04-19 ENCOUNTER — Other Ambulatory Visit: Payer: Self-pay | Admitting: Internal Medicine

## 2014-04-19 ENCOUNTER — Other Ambulatory Visit: Payer: Self-pay | Admitting: Neurology

## 2014-04-20 NOTE — Telephone Encounter (Signed)
Called to the pharmacy and left on voicemail. 

## 2014-04-20 NOTE — Telephone Encounter (Signed)
Rx signed and faxed.

## 2014-04-20 NOTE — Telephone Encounter (Signed)
Call in #60 of both with no rf

## 2014-05-12 ENCOUNTER — Other Ambulatory Visit: Payer: Self-pay | Admitting: Family Medicine

## 2014-05-12 ENCOUNTER — Other Ambulatory Visit: Payer: Self-pay | Admitting: Hematology & Oncology

## 2014-05-15 NOTE — Telephone Encounter (Signed)
Ok to refill x 2  

## 2014-05-16 NOTE — Telephone Encounter (Signed)
Sent to the pharmacy by e-scribe. 

## 2014-05-18 ENCOUNTER — Encounter: Payer: Self-pay | Admitting: Neurology

## 2014-05-18 ENCOUNTER — Ambulatory Visit (INDEPENDENT_AMBULATORY_CARE_PROVIDER_SITE_OTHER): Payer: Medicare Other | Admitting: Neurology

## 2014-05-18 DIAGNOSIS — R0902 Hypoxemia: Secondary | ICD-10-CM

## 2014-05-18 DIAGNOSIS — R5383 Other fatigue: Secondary | ICD-10-CM

## 2014-05-18 DIAGNOSIS — R0683 Snoring: Secondary | ICD-10-CM

## 2014-05-18 DIAGNOSIS — G4700 Insomnia, unspecified: Secondary | ICD-10-CM | POA: Diagnosis not present

## 2014-05-18 DIAGNOSIS — G479 Sleep disorder, unspecified: Secondary | ICD-10-CM

## 2014-05-18 DIAGNOSIS — F409 Phobic anxiety disorder, unspecified: Secondary | ICD-10-CM | POA: Insufficient documentation

## 2014-05-18 DIAGNOSIS — F5105 Insomnia due to other mental disorder: Secondary | ICD-10-CM

## 2014-05-18 MED ORDER — DONEPEZIL HCL 10 MG PO TABS: 10.0000 mg | ORAL_TABLET | Freq: Every morning | ORAL | Status: DC

## 2014-05-18 MED ORDER — ZOLPIDEM TARTRATE ER 12.5 MG PO TBCR: 12.5000 mg | EXTENDED_RELEASE_TABLET | Freq: Every day | ORAL | Status: DC

## 2014-05-18 NOTE — Progress Notes (Signed)
[facility]  Provider:  Dr Deacon Gadbois Referring Provider: Burnis Medin, MD Primary Care Physician:  Lottie Dawson, MD  Chief Complaint  Patient presents with  . RV sleep/memory    Rm 11, alone    HPI:  [name] is a 54 y.o. female seen here  Originally as a referral from Dr. Regis Bill and Dr Marin Olp,   she  Is now seen regularly for follow up for neuropathy and cognitive impairment;  History recapture., CD: [name] is a 54 year old right-handed female, whom  I first encountered in 2010-.  She is de facto a new patient at her last appointment due to a 3 year hiatus. .  I begun treating her with Aricept for memory loss . At the base of her of symptoms is a metastatic cancer with unknown primary tumor.  The patient underwent a complete hysterectomy this or for ectomy 2010. She begun and for all very uncomfortable as her oncologist based on the histology results from metastatic.  I believe her chemotherapy begun already in December 2009. She became severely pain impaired, had  aching soreness and started to get some relief from Lyrica -  but remained with an impaired sense of fine touch temperature and pinprick,  as well as balance problems and ataxia. She is here today for refills but also for a reevaluation. In 2011 with me .The patient had that time suffered a backwards fall and a concussion. She was discharged after her head CT at the: ER but continued to have dizziness nausea of forgetfulness and was severely ataxic when I saw her. During my evaluation with the patient became evident that she has severe memory deficits,  she also has a neuropathy.   09-22-13 ; She has chronic back pain for close to a year and would like to be referred to a pain specialist-. She tried chiropractor, accupuncture, massage .  Her neuropathic condition is her main concern today', the patient feels impaired by pain night and day. She feels also that she has less exercise  tolerance because of the pain therefore gaining weight. She has also noted that her fine motor skills are somewhat impaired especially the numbness in her fingertips. Meanwhile the neuropathy hasn't ascended from the tips of the fingers to the mid palm. She cannot pick up a needle for example of she has trouble closing jewelry or buttons. In her feet,  her neuropathy spread from the toes to the mid plantar area , involving the forefoot to the dorsum. This impairs gait stability. She is especially at fall risk on uneven surface, such as thick carpets, sand or grass. She fell 2 weeks ago on [place] , the sidewalk was uneven - [place] Falls have not let to emergency admissions. She slipped in a store and in her shower. No new concussion. Cognition has improved by MMSE result , on Aricept. She feels her processing speed is slower, and she still has trouble remembering names . For this reason , I added a MOCA test.  05-18-14: CD  [name] is here today for her yearly revisit she is doing remarkably well knowing about her history of metastatic cancer with unknown primary to more she seems to have battled her disease very well.  She has lost some weight since her last visit,  her blood pressure is well controlled.  The HST study performed on 10-23-13 showed obstructive sleep apnea not to be present,  but some hypoxemia at night there were  109 minutes of oxygenation at 88% or less.  Since this test was a home sleep test there is a higher likelihood of artifact. The patient does  1-2 times a night  feel air hungry. She does wake occasionally up with a feeling that she doesn't  breath enough. And her heart will be racing-  palpitations have also not been seen in her home sleep test. She described sudden falls still being a concern, neuropathy contributing to this.   her cognition is improved, by her own subjective assessment as well as  MOCA test today : scored 26 out of 30 points. This is a  normal range for her cognitive function.  Ambien dependent , unable to go to sleep without sleep aid for several years now.  Her list of medication was reviewed and there have been no recent changes. Her dog is her main social contact , besides her church family.    Review of Systems: Out of a complete 14 system review, the patient complains of only the following symptoms, and all other reviewed systems are negative. Memory loss, ataxia, anxiety, episodic hypertension.high fall risk, neuropathic numbness .  Retropulsion, but today 05-18-14  Negative romberg.   History   Social History  . Marital Status: Divorced    Spouse Name: N/A    Number of Children: 1  . Years of Education: Master's   Occupational History  .      disabled   Social History Main Topics  . Smoking status: Current Every Day Smoker -- 1.00 packs/day for 37 years    Types: Cigarettes    Start date: 06/16/1978  . Smokeless tobacco: Never Used     Comment: 05-18-14  STILL SMOKING  . Alcohol Use: 0.0 oz/week    0 Not specified per week     Comment: rare, 3 weekly  . Drug Use: No  . Sexual Activity: Not on file   Other Topics Concern  . Not on file   Social History Narrative   Divorced   1 son /1 adopted adult son  2 dogs    Sales occupation on disability for now   Regular exercise-yes money stress   Daily caffeine use 2-3 and 2 cups of coffee   On since a security disability since 2012   Patient is right-handed.   Patient has a Master's degree.    Family History  Problem Relation Age of Onset  . Pulmonary embolism Mother   . Hypertension Father   . Breast cancer Maternal Grandmother     diagnosed in early 96s  . Hyperlipidemia      Past Medical History  Diagnosis Date  . Hx pulmonary embolism   . Obesity   . Metrorrhagia   . Allergic rhinitis   . Iron deficiency anemia   . Hypertension   . Alopecia   . Vitamin D deficiency   . HLD (hyperlipidemia)   . Adjustment disorder   . Anxiety and  depression   . Raynaud's syndrome   . Right groin mass   . 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 Squamous cell carcinoma of skin   . Ovarian cancer 48  . Vision abnormalities   . Neuropathy   . 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    Past Surgical History  Procedure Laterality Date  . Breast reduction surgery  2000  . Gastric bypass  1979  . Tonsillectomy    . Cesarean section  1991  . Abdominal hysterectomy  05/2008    TAH/BSO  . Colonoscopy  2004; 12/07/10    hemorrhoids    Current Outpatient Prescriptions  Medication Sig Dispense Refill  . aspirin 81 MG tablet Take 81 mg by mouth daily. 2 TABS IN PM    . donepezil (ARICEPT) 10 MG tablet TAKE 1 TABLET BY MOUTH EVERY MORNING 90 tablet 2  . DULoxetine (CYMBALTA) 60 MG capsule Take 1 capsule (60 mg total) by mouth daily. 30 capsule 2  . LYRICA 150 MG capsule TAKE 1 CAPSULE BY MOUTH TWICE DAILY 60 capsule 0  . Multiple Vitamin (MULTIVITAMIN WITH MINERALS) TABS tablet Take 1 tablet by mouth daily.    Marland Kitchen omega-3 acid ethyl esters (LOVAZA) 1 G capsule Take 1 g by mouth daily.    . orphenadrine (NORFLEX) 100 MG tablet TAKE 1 TABLET BY MOUTH TWICE DAILY AS NEEDED FOR MUSCLE SPASMS 60 tablet 1  . potassium chloride (K-DUR) 10 MEQ tablet TAKE 2 TABLETS BY MOUTH DAILY 60 tablet 8  . simvastatin (ZOCOR) 40 MG tablet TAKE 1 TABLET BY MOUTH EVERY DAY 30 tablet 8  . triamterene-hydrochlorothiazide (MAXZIDE-25) 37.5-25 MG per tablet TAKE 2 TABLET BY MOUTH EVERY DAY 60 tablet 11  . Vitamin D, Ergocalciferol, (DRISDOL) 50000 UNITS CAPS capsule TAKE 1 CAPSULE BY MOUTH ONCE WEEKLY 12 capsule 0  . zolpidem (AMBIEN CR) 12.5 MG CR tablet TAKE 1 TABLET BY MOUTH EVERY DAY AT BEDTIME 30 tablet 5   No current facility-administered medications for this visit.    Allergies as of 05/18/2014 - Review Complete 05/18/2014  Allergen Reaction Noted  . Codeine Nausea Only   . Orange  Itching and Rash 07/06/2010    Vitals: BP 134/94 mmHg  Pulse 82  Resp 14  Ht 5' 8.5" (1.74 m)  Wt 241 lb (109.317 kg)  BMI 36.11 kg/m2 Last Weight:  Wt Readings from Last 1 Encounters:  05/18/14 241 lb (109.317 kg)   Last Height:   Ht Readings from Last 1 Encounters:  05/18/14 5' 8.5" (1.74 m)   Vision Screening:  See vitals  Physical exam:  General: The patient is awake, alert and appears not in acute distress. The patient is well groomed. Head: Normocephalic, atraumatic. Neck is supple. Mallampati 3 , neck circumference:19.0 , thick neck.  Hoarse voice, no  Bruxism, clicking at  TMJ.  unrestricted nasal air flow.  Cardiovascular:  Regular rate and rhythm, without  murmurs or carotid bruit, and without distended neck veins. Respiratory: Lungs are clear to auscultation. Skin:  Without evidence of edema, or rash Trunk: BMI is elevated , but the patient has a normal posture.  Neurologic exam : The patient is awake and alert, oriented to place and time.   Memory: tested by MMSE 30-30 , followed by  MOCA: 26 out of 30 -  strong result, normal for age and education.   . There is a normal attention span & concentration ability.  Speech is fluent without  dysarthria, dysphonia or aphasia.  Mood and affect are appropriate. She is happy, content and satisfied with her medical care.   Cranial nerves: Pupils are equal and briskly reactive to light. Funduscopic exam without evidence of pallor or edema. Extraocular movements  in vertical and horizontal planes intact and without nystagmus. Visual fields by finger perimetry are intact. Hearing to finger rub intact.  Facial sensation intact to fine touch. Facial motor strength is symmetric and tongue and uvula move midline.  Motor exam:  Normal tone and  normal muscle bulk and symmetric normal strength in all extremities.  Sensory:  Fine touch, pinprick and vibration were tested in all extremities. Proprioception is tested in the upper  extremities only.   Coordination: Rapid alternating movements in the fingers/hands is tested and normal.  Finger-to-nose maneuver tested and normal without evidence of ataxia, dysmetria or tremor.  Gait and station: Patient walks without assistive device. Strength within normal limits. Stance is stable and wider based . Tandem gait is ataxic - unsteady , she  turns with 4 Steps, which  are unfragmented.  Romberg testing shows her again  swaying , but she did not need corrective steps , did not pro pulse or retro pulse. She has no impaired righting reflexes.   Deep tendon reflexes: in the  upper and lower extremities are symmetric and intact. Babinski maneuver response is downgoing.   Assessment:  After physical and neurologic examination, review of laboratory studies, imaging,  neurophysiology testing and pre-existing records, assessment :   Please note that this patients records from 3 plus years ago  are in centricity.  MRI , PSG , Labs. The patient is on CYMBALTA and LYRICA , I cannot add SSRI.    Primary [place] unknown, mets .   1)Hypoxemia   2)Neuropathy with unsteady gait   3) Depression, improved   4 )insomnia, controlled on ambine .  ONO  Oxygen need? Has hypoxemia by sleep study.  Ambien refill. 12.5 mg - sleep eating reported.  Belsomra offered  to try. Declined.  MOCA revealed no cognitive concerns.  Gait stabilization, wear no heel  shoes with extra support, keep on straight surfaces.            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  Vibration and fine touch sensation are significantly diminished in both feet and at the ankle level. She a cannot longer type well, 'hunt and peck" /.  In addition there was significant memory loss , which has improved !! . Her MMSE  was 25/30 points she missed all 3 recall words, could not name the type of building and the date in 2014 , now 29-30 points, and MOCA 27 out of 30 .  She is still unable to read a book or  watch several installments of a series on TV, since earlier events in the story line are not remembered.   The patient's original career was working in the  [employer] . I cannot see her returning to this kind of job description based on her cognitive impairment.  She would also not be able to do a physically demanding job her long-standing walking if necessary, due to her ataxia and neuropathy.    Plan:  Treatment plan and additional workup will be reviewed under Problem List.  Continue Aricept. Referral to pain intervention, Dr. Maryjean Ka.  Patient not interested in another round of PT. Neuropathy treatment with Lyrica.   Weight gain , snoring , wakes up , uses Ambien nightly.  SPLIT study scheduled.

## 2014-05-20 ENCOUNTER — Ambulatory Visit (HOSPITAL_BASED_OUTPATIENT_CLINIC_OR_DEPARTMENT_OTHER): Payer: Medicare Other

## 2014-05-20 VITALS — BP 128/86 | HR 98 | Temp 98.0°F | Resp 16

## 2014-05-20 DIAGNOSIS — Z1509 Genetic susceptibility to other malignant neoplasm: Principal | ICD-10-CM

## 2014-05-20 DIAGNOSIS — Z452 Encounter for adjustment and management of vascular access device: Secondary | ICD-10-CM | POA: Diagnosis not present

## 2014-05-20 DIAGNOSIS — Z85028 Personal history of other malignant neoplasm of stomach: Secondary | ICD-10-CM | POA: Diagnosis not present

## 2014-05-20 DIAGNOSIS — Z1501 Genetic susceptibility to malignant neoplasm of breast: Secondary | ICD-10-CM

## 2014-05-20 MED ORDER — HEPARIN SOD (PORK) LOCK FLUSH 100 UNIT/ML IV SOLN
500.0000 [IU] | Freq: Once | INTRAVENOUS | Status: AC
  Administered 2014-05-20: 500 [IU] via INTRAVENOUS
  Filled 2014-05-20: qty 5

## 2014-05-20 MED ORDER — SODIUM CHLORIDE 0.9 % IJ SOLN
10.0000 mL | Freq: Once | INTRAMUSCULAR | Status: AC
  Administered 2014-05-20: 10 mL via INTRAVENOUS
  Filled 2014-05-20: qty 10

## 2014-05-20 NOTE — Patient Instructions (Signed)

## 2014-06-09 ENCOUNTER — Other Ambulatory Visit: Payer: Self-pay

## 2014-06-09 DIAGNOSIS — Z1231 Encounter for screening mammogram for malignant neoplasm of breast: Secondary | ICD-10-CM

## 2014-06-09 DIAGNOSIS — Z9882 Breast implant status: Secondary | ICD-10-CM

## 2014-06-23 DIAGNOSIS — H52223 Regular astigmatism, bilateral: Secondary | ICD-10-CM | POA: Diagnosis not present

## 2014-06-23 DIAGNOSIS — H524 Presbyopia: Secondary | ICD-10-CM | POA: Diagnosis not present

## 2014-06-23 DIAGNOSIS — H5213 Myopia, bilateral: Secondary | ICD-10-CM | POA: Diagnosis not present

## 2014-07-12 ENCOUNTER — Ambulatory Visit
Admission: RE | Admit: 2014-07-12 | Discharge: 2014-07-12 | Disposition: A | Discharge status: Home / Self Care | Payer: Medicare Other | Source: Ambulatory Visit

## 2014-07-12 DIAGNOSIS — Z1231 Encounter for screening mammogram for malignant neoplasm of breast: Secondary | ICD-10-CM | POA: Diagnosis not present

## 2014-07-12 DIAGNOSIS — Z9882 Breast implant status: Secondary | ICD-10-CM

## 2014-07-15 ENCOUNTER — Ambulatory Visit: Payer: Self-pay | Admitting: Hematology & Oncology

## 2014-07-15 ENCOUNTER — Other Ambulatory Visit: Payer: Medicare Other | Admitting: Lab

## 2014-07-18 ENCOUNTER — Encounter: Payer: Self-pay | Admitting: Hematology & Oncology

## 2014-07-18 ENCOUNTER — Other Ambulatory Visit (HOSPITAL_BASED_OUTPATIENT_CLINIC_OR_DEPARTMENT_OTHER): Payer: Medicare Other | Admitting: Lab

## 2014-07-18 ENCOUNTER — Ambulatory Visit (HOSPITAL_BASED_OUTPATIENT_CLINIC_OR_DEPARTMENT_OTHER): Payer: Medicare Other

## 2014-07-18 ENCOUNTER — Ambulatory Visit (HOSPITAL_BASED_OUTPATIENT_CLINIC_OR_DEPARTMENT_OTHER): Payer: Medicare Other | Admitting: Hematology & Oncology

## 2014-07-18 VITALS — BP 134/90 | HR 87 | Temp 97.7°F | Resp 14 | Ht 68.0 in | Wt 242.0 lb

## 2014-07-18 DIAGNOSIS — Z8543 Personal history of malignant neoplasm of ovary: Secondary | ICD-10-CM

## 2014-07-18 DIAGNOSIS — Z1501 Genetic susceptibility to malignant neoplasm of breast: Secondary | ICD-10-CM

## 2014-07-18 DIAGNOSIS — Z720 Tobacco use: Secondary | ICD-10-CM

## 2014-07-18 DIAGNOSIS — D500 Iron deficiency anemia secondary to blood loss (chronic): Secondary | ICD-10-CM

## 2014-07-18 DIAGNOSIS — Z1509 Genetic susceptibility to other malignant neoplasm: Principal | ICD-10-CM

## 2014-07-18 DIAGNOSIS — Z9221 Personal history of antineoplastic chemotherapy: Secondary | ICD-10-CM | POA: Diagnosis not present

## 2014-07-18 DIAGNOSIS — R4189 Other symptoms and signs involving cognitive functions and awareness: Secondary | ICD-10-CM

## 2014-07-18 DIAGNOSIS — G620 Drug-induced polyneuropathy: Secondary | ICD-10-CM | POA: Diagnosis not present

## 2014-07-18 DIAGNOSIS — R29818 Other symptoms and signs involving the nervous system: Secondary | ICD-10-CM | POA: Diagnosis not present

## 2014-07-18 DIAGNOSIS — C55 Malignant neoplasm of uterus, part unspecified: Secondary | ICD-10-CM

## 2014-07-18 DIAGNOSIS — Z1502 Genetic susceptibility to malignant neoplasm of ovary: Secondary | ICD-10-CM

## 2014-07-18 LAB — CMP (CANCER CENTER ONLY)
ALK PHOS: 96 U/L — AB (ref 26–84)
ALT(SGPT): 23 U/L (ref 10–47)
AST: 25 U/L (ref 11–38)
Albumin: 3.5 g/dL (ref 3.3–5.5)
BILIRUBIN TOTAL: 0.5 mg/dL (ref 0.20–1.60)
BUN, Bld: 22 mg/dL (ref 7–22)
CALCIUM: 9.3 mg/dL (ref 8.0–10.3)
CO2: 30 mEq/L (ref 18–33)
Chloride: 100 mEq/L (ref 98–108)
Creat: 1 mg/dl (ref 0.6–1.2)
Glucose, Bld: 89 mg/dL (ref 73–118)
Potassium: 3.8 mEq/L (ref 3.3–4.7)
SODIUM: 144 meq/L (ref 128–145)
Total Protein: 6.9 g/dL (ref 6.4–8.1)

## 2014-07-18 LAB — CBC WITH DIFFERENTIAL (CANCER CENTER ONLY)
BASO#: 0 10*3/uL (ref 0.0–0.2)
BASO%: 0.2 % (ref 0.0–2.0)
EOS%: 1.6 % (ref 0.0–7.0)
Eosinophils Absolute: 0.1 10*3/uL (ref 0.0–0.5)
HEMATOCRIT: 39 % (ref 34.8–46.6)
HGB: 12.9 g/dL (ref 11.6–15.9)
LYMPH#: 1.9 10*3/uL (ref 0.9–3.3)
LYMPH%: 28.9 % (ref 14.0–48.0)
MCH: 29.3 pg (ref 26.0–34.0)
MCHC: 33.1 g/dL (ref 32.0–36.0)
MCV: 89 fL (ref 81–101)
MONO#: 0.4 10*3/uL (ref 0.1–0.9)
MONO%: 6.7 % (ref 0.0–13.0)
NEUT%: 62.6 % (ref 39.6–80.0)
NEUTROS ABS: 4 10*3/uL (ref 1.5–6.5)
PLATELETS: 228 10*3/uL (ref 145–400)
RBC: 4.4 10*6/uL (ref 3.70–5.32)
RDW: 15.2 % (ref 11.1–15.7)
WBC: 6.4 10*3/uL (ref 3.9–10.0)

## 2014-07-18 LAB — IRON AND TIBC CHCC
%SAT: 23 % (ref 21–57)
Iron: 68 ug/dL (ref 41–142)
TIBC: 293 ug/dL (ref 236–444)
UIBC: 224 ug/dL (ref 120–384)

## 2014-07-18 LAB — FERRITIN CHCC: Ferritin: 57 ng/ml (ref 9–269)

## 2014-07-18 MED ORDER — SODIUM CHLORIDE 0.9 % IJ SOLN
10.0000 mL | INTRAMUSCULAR | Status: DC | PRN
  Administered 2014-07-18: 10 mL via INTRAVENOUS
  Filled 2014-07-18: qty 10

## 2014-07-18 MED ORDER — HEPARIN SOD (PORK) LOCK FLUSH 100 UNIT/ML IV SOLN
500.0000 [IU] | Freq: Once | INTRAVENOUS | Status: AC
  Administered 2014-07-18: 500 [IU] via INTRAVENOUS
  Filled 2014-07-18: qty 5

## 2014-07-18 NOTE — Progress Notes (Signed)
Hematology and Oncology Follow Up Visit  UNICE VANTASSEL [id number] 03/08/1961 54 y.o. 07/18/2014   Principle Diagnosis:  1. 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 2. BRCA1 positive. 3. Severe chemotherapy-induced neuropathy. 4. Intermittent iron-deficiency anemia.  Current Therapy:    Observation     Interim History:  Ms.  Ballew is back for followup. She is looking pretty good.  She is still smoking but trying to cut back.  She's not exercising as much as she should. She is trying to watch what she eats.  She has not had any problems with cough. She's had no fever. She's had no change in bowel or bladder habits.  She still has neuropathy. This is a chronic problem for her. It seems be under fairly good control right now.   she's does have some memory issues. This does not seem to be as bad right now.  She has not had a problem with rashes. She has had no significant weight loss or weight gain.  She's had no bleeding.  She said that she had a mammogram a couple weeks ago. I certainly cannot find this. I will have to call the breast center to see if they have a copy.  Needs alternating MRI and mammograms given that she is BRCA1 positive    Medications:  Current outpatient prescriptions:  .  aspirin 81 MG tablet, Take 81 mg by mouth daily. 2 TABS IN PM, Disp: , Rfl:  .  donepezil (ARICEPT) 10 MG tablet, Take 1 tablet (10 mg total) by mouth every morning., Disp: 90 tablet, Rfl: 2 .  DULoxetine (CYMBALTA) 60 MG capsule, Take 1 capsule (60 mg total) by mouth daily., Disp: 30 capsule, Rfl: 2 .  LYRICA 150 MG capsule, TAKE 1 CAPSULE BY MOUTH TWICE DAILY, Disp: 60 capsule, Rfl: 0 .  Multiple Vitamin (MULTIVITAMIN WITH MINERALS) TABS tablet, Take 1 tablet by mouth daily., Disp: , Rfl:  .  omega-3 acid ethyl esters (LOVAZA) 1 G capsule, Take 1 g by mouth daily., Disp: , Rfl:  .  orphenadrine (NORFLEX) 100 MG tablet, TAKE 1 TABLET BY MOUTH TWICE DAILY AS NEEDED  FOR MUSCLE SPASMS, Disp: 60 tablet, Rfl: 1 .  potassium chloride (K-DUR) 10 MEQ tablet, TAKE 2 TABLETS BY MOUTH DAILY, Disp: 60 tablet, Rfl: 8 .  simvastatin (ZOCOR) 40 MG tablet, TAKE 1 TABLET BY MOUTH EVERY DAY, Disp: 30 tablet, Rfl: 8 .  triamterene-hydrochlorothiazide (MAXZIDE-25) 37.5-25 MG per tablet, TAKE 2 TABLET BY MOUTH EVERY DAY, Disp: 60 tablet, Rfl: 11 .  Vitamin D, Ergocalciferol, (DRISDOL) 50000 UNITS CAPS capsule, TAKE 1 CAPSULE BY MOUTH ONCE WEEKLY, Disp: 12 capsule, Rfl: 0 .  zolpidem (AMBIEN CR) 12.5 MG CR tablet, Take 1 tablet (12.5 mg total) by mouth at bedtime., Disp: 30 tablet, Rfl: 5 No current facility-administered medications for this visit.  Facility-Administered Medications Ordered in Other Visits:  .  sodium chloride 0.9 % injection 10 mL, 10 mL, Intravenous, PRN, [name], MD, 10 mL at 07/18/14 1033  Allergies:  Allergies  Allergen Reactions  . Codeine Nausea Only    REACTION: Nausea  . Orange Itching and Rash    Past Medical History, Surgical history, Social history, and Family History were reviewed and updated.  Review of Systems: As above  Physical Exam:  height is 5' 8"  (1.727 m) and weight is 242 lb (109.77 kg). Her oral temperature is 97.7 F (36.5 C). Her blood pressure is 134/90 and her pulse is 87. Her respiration is 14.  Obese African-American female in no obvious distress. Head and neck exam shows no ocular or oral lesions. Shows no pulsatile cervical or supraclavicular lymph nodes. Lungs are clear. Cardiac exam regular rate and rhythm with no murmurs rubs or bruits. Abdomen is soft with good bowel sounds. There is no fluid wave. There is no palpable liver or spleen tip. She has well-healed laparotomy scar. Back exam no tenderness over the spine ribs or hips. Extremities shows no clubbing cyanosis or edema. Neurological exam shows no focal neurological deficits. She does have some increased sensitivity to touch in her feet. Skin exam no  rashes.  Lab Results  Component Value Date   WBC 6.4 07/18/2014   HGB 12.9 07/18/2014   HCT 39.0 07/18/2014   MCV 89 07/18/2014   PLT 228 07/18/2014     Chemistry      Component Value Date/Time   NA 144 07/18/2014 0952   NA 138 03/25/2014 1517   K 3.8 07/18/2014 0952   K 3.8 03/25/2014 1517   CL 100 07/18/2014 0952   CL 105 03/25/2014 1517   CO2 30 07/18/2014 0952   CO2 26 03/25/2014 1517   BUN 22 07/18/2014 0952   BUN 17 03/25/2014 1517   CREATININE 1.0 07/18/2014 0952   CREATININE 1.20* 03/25/2014 1517      Component Value Date/Time   CALCIUM 9.3 07/18/2014 0952   CALCIUM 9.5 03/25/2014 1517   ALKPHOS 96* 07/18/2014 0952   ALKPHOS 95 03/25/2014 1517   AST 25 07/18/2014 0952   AST 24 03/25/2014 1517   ALT 23 07/18/2014 0952   ALT 26 03/25/2014 1517   BILITOT 0.50 07/18/2014 0952   BILITOT 0.3 03/25/2014 1517         Impression and Plan: Ms. Ellithorpe is 54 year old African American female with a history of a poorly differentiated carcinoma of the abdomen. I did do molecular assay studies on this. It was felt to be ovarian cancer in origin.  She is BRCA1 positive. She is in remission. She had 6 cycles of chemotherapy with carboplatinum and Taxotere.. She's been in remission now for over 6 years.  She did develop neuropathy from the chemotherapy. She also probably has developed some memory issues.   From my point of view, I don't see any need for additional studies.  We will plan to see her back in another 4 months.   [name], MD 3/7/20161:28 PM

## 2014-07-19 ENCOUNTER — Telehealth: Payer: Self-pay | Admitting: *Deleted

## 2014-07-19 NOTE — Telephone Encounter (Addendum)
Patient aware.  ----- Message from [name], MD sent at 07/18/2014  6:44 PM EST ----- Call - labs look ok!!!  pete

## 2014-07-20 ENCOUNTER — Other Ambulatory Visit: Payer: Self-pay | Admitting: Internal Medicine

## 2014-07-21 NOTE — Telephone Encounter (Signed)
Ok to refill x 2  

## 2014-07-22 NOTE — Telephone Encounter (Signed)
Sent to the pharmacy by e-scribe. 

## 2014-08-22 ENCOUNTER — Other Ambulatory Visit: Payer: Self-pay | Admitting: Hematology & Oncology

## 2014-09-12 ENCOUNTER — Ambulatory Visit (HOSPITAL_BASED_OUTPATIENT_CLINIC_OR_DEPARTMENT_OTHER): Payer: Medicare Other

## 2014-09-12 VITALS — BP 124/80 | HR 91 | Temp 98.1°F | Resp 16

## 2014-09-12 DIAGNOSIS — Z8543 Personal history of malignant neoplasm of ovary: Secondary | ICD-10-CM | POA: Diagnosis not present

## 2014-09-12 DIAGNOSIS — C50919 Malignant neoplasm of unspecified site of unspecified female breast: Secondary | ICD-10-CM

## 2014-09-12 DIAGNOSIS — Z452 Encounter for adjustment and management of vascular access device: Secondary | ICD-10-CM | POA: Diagnosis not present

## 2014-09-12 MED ORDER — SODIUM CHLORIDE 0.9 % IJ SOLN
10.0000 mL | INTRAMUSCULAR | Status: DC | PRN
  Administered 2014-09-12: 10 mL via INTRAVENOUS
  Filled 2014-09-12: qty 10

## 2014-09-12 MED ORDER — HEPARIN SOD (PORK) LOCK FLUSH 100 UNIT/ML IV SOLN
500.0000 [IU] | Freq: Once | INTRAVENOUS | Status: AC
  Administered 2014-09-12: 500 [IU] via INTRAVENOUS
  Filled 2014-09-12: qty 5

## 2014-09-12 NOTE — Patient Instructions (Signed)

## 2014-09-22 ENCOUNTER — Other Ambulatory Visit: Payer: Self-pay | Admitting: Internal Medicine

## 2014-09-22 ENCOUNTER — Other Ambulatory Visit: Payer: Self-pay | Admitting: Neurology

## 2014-09-22 ENCOUNTER — Other Ambulatory Visit: Payer: Self-pay | Admitting: Hematology & Oncology

## 2014-09-22 NOTE — Telephone Encounter (Signed)
Rx has been signed and faxed

## 2014-09-23 NOTE — Telephone Encounter (Signed)
Ok refill  2 months for the potassium and lipid   1 refill for the norflex  she has cpx in June   Lab Results  Component Value Date   WBC 6.4 07/18/2014   HGB 12.9 07/18/2014   HCT 39.0 07/18/2014   PLT 228 07/18/2014   GLUCOSE 89 07/18/2014   CHOL 161 11/02/2013   TRIG 171.0* 11/02/2013   HDL 51.20 11/02/2013   LDLDIRECT 98.7 09/04/2009   LDLCALC 76 11/02/2013   ALT 23 07/18/2014   AST 25 07/18/2014   NA 144 07/18/2014   K 3.8 07/18/2014   CL 100 07/18/2014   CREATININE 1.0 07/18/2014   BUN 22 07/18/2014   CO2 30 07/18/2014   TSH 0.57 11/02/2013   INR 0.86 06/02/2013

## 2014-09-23 NOTE — Telephone Encounter (Signed)
Sent to the pharmacy by e-scribe. 

## 2014-10-12 ENCOUNTER — Other Ambulatory Visit (INDEPENDENT_AMBULATORY_CARE_PROVIDER_SITE_OTHER): Payer: Medicare Other

## 2014-10-12 DIAGNOSIS — I10 Essential (primary) hypertension: Secondary | ICD-10-CM | POA: Diagnosis not present

## 2014-10-12 DIAGNOSIS — E785 Hyperlipidemia, unspecified: Secondary | ICD-10-CM

## 2014-10-12 DIAGNOSIS — Z0000 Encounter for general adult medical examination without abnormal findings: Secondary | ICD-10-CM

## 2014-10-12 LAB — HEPATIC FUNCTION PANEL
ALK PHOS: 79 U/L (ref 39–117)
ALT: 25 U/L (ref 0–35)
AST: 27 U/L (ref 0–37)
Albumin: 4.1 g/dL (ref 3.5–5.2)
Bilirubin, Direct: 0 mg/dL (ref 0.0–0.3)
TOTAL PROTEIN: 7.1 g/dL (ref 6.0–8.3)
Total Bilirubin: 0.5 mg/dL (ref 0.2–1.2)

## 2014-10-12 LAB — CBC WITH DIFFERENTIAL/PLATELET
BASOS ABS: 0 10*3/uL (ref 0.0–0.1)
Basophils Relative: 0.2 % (ref 0.0–3.0)
EOS PCT: 0.8 % (ref 0.0–5.0)
Eosinophils Absolute: 0.1 10*3/uL (ref 0.0–0.7)
HEMATOCRIT: 45.7 % (ref 36.0–46.0)
Hemoglobin: 15 g/dL (ref 12.0–15.0)
LYMPHS ABS: 2 10*3/uL (ref 0.7–4.0)
Lymphocytes Relative: 25.9 % (ref 12.0–46.0)
MCHC: 32.8 g/dL (ref 30.0–36.0)
MCV: 90.1 fl (ref 78.0–100.0)
MONOS PCT: 4.3 % (ref 3.0–12.0)
Monocytes Absolute: 0.3 10*3/uL (ref 0.1–1.0)
NEUTROS PCT: 68.8 % (ref 43.0–77.0)
Neutro Abs: 5.3 10*3/uL (ref 1.4–7.7)
Platelets: 213 10*3/uL (ref 150.0–400.0)
RBC: 5.07 Mil/uL (ref 3.87–5.11)
RDW: 15.1 % (ref 11.5–15.5)
WBC: 7.7 10*3/uL (ref 4.0–10.5)

## 2014-10-12 LAB — LIPID PANEL
Cholesterol: 155 mg/dL (ref 0–200)
HDL: 46.8 mg/dL (ref >39.00)
LDL CALC: 70 mg/dL (ref 0–99)
NONHDL: 108.2
TRIGLYCERIDES: 192 mg/dL — AB (ref 0.0–149.0)
Total CHOL/HDL Ratio: 3
VLDL: 38.4 mg/dL (ref 0.0–40.0)

## 2014-10-12 LAB — BASIC METABOLIC PANEL
BUN: 20 mg/dL (ref 6–23)
CO2: 30 mEq/L (ref 19–32)
Calcium: 9.8 mg/dL (ref 8.4–10.5)
Chloride: 100 mEq/L (ref 96–112)
Creatinine, Ser: 1.04 mg/dL (ref 0.40–1.20)
GFR: 70.98 mL/min (ref >60.00)
Glucose, Bld: 85 mg/dL (ref 70–99)
Potassium: 4.1 mEq/L (ref 3.5–5.1)
Sodium: 135 mEq/L (ref 135–145)

## 2014-10-12 LAB — TSH: TSH: 2.96 u[IU]/mL (ref 0.35–4.50)

## 2014-10-18 ENCOUNTER — Other Ambulatory Visit: Payer: Self-pay | Admitting: Hematology & Oncology

## 2014-10-19 ENCOUNTER — Ambulatory Visit (INDEPENDENT_AMBULATORY_CARE_PROVIDER_SITE_OTHER): Payer: Medicare Other | Admitting: Internal Medicine

## 2014-10-19 ENCOUNTER — Encounter: Payer: Self-pay | Admitting: Internal Medicine

## 2014-10-19 VITALS — BP 118/80 | Temp 97.7°F | Ht 67.5 in | Wt 231.4 lb

## 2014-10-19 DIAGNOSIS — Z0000 Encounter for general adult medical examination without abnormal findings: Secondary | ICD-10-CM

## 2014-10-19 DIAGNOSIS — Z9181 History of falling: Secondary | ICD-10-CM

## 2014-10-19 DIAGNOSIS — F4323 Adjustment disorder with mixed anxiety and depressed mood: Secondary | ICD-10-CM

## 2014-10-19 DIAGNOSIS — F17200 Nicotine dependence, unspecified, uncomplicated: Secondary | ICD-10-CM

## 2014-10-19 DIAGNOSIS — R29818 Other symptoms and signs involving the nervous system: Secondary | ICD-10-CM

## 2014-10-19 DIAGNOSIS — Z1502 Genetic susceptibility to malignant neoplasm of ovary: Secondary | ICD-10-CM

## 2014-10-19 DIAGNOSIS — Z79899 Other long term (current) drug therapy: Secondary | ICD-10-CM | POA: Diagnosis not present

## 2014-10-19 DIAGNOSIS — Z1501 Genetic susceptibility to malignant neoplasm of breast: Secondary | ICD-10-CM

## 2014-10-19 DIAGNOSIS — E559 Vitamin D deficiency, unspecified: Secondary | ICD-10-CM

## 2014-10-19 DIAGNOSIS — Z720 Tobacco use: Secondary | ICD-10-CM

## 2014-10-19 DIAGNOSIS — G620 Drug-induced polyneuropathy: Secondary | ICD-10-CM

## 2014-10-19 DIAGNOSIS — T451X5A Adverse effect of antineoplastic and immunosuppressive drugs, initial encounter: Secondary | ICD-10-CM

## 2014-10-19 DIAGNOSIS — Z1509 Genetic susceptibility to other malignant neoplasm: Secondary | ICD-10-CM

## 2014-10-19 DIAGNOSIS — R4189 Other symptoms and signs involving cognitive functions and awareness: Secondary | ICD-10-CM

## 2014-10-19 DIAGNOSIS — E785 Hyperlipidemia, unspecified: Secondary | ICD-10-CM

## 2014-10-19 LAB — VITAMIN D 25 HYDROXY (VIT D DEFICIENCY, FRACTURES): VITD: 36 ng/mL (ref 30.00–100.00)

## 2014-10-19 MED ORDER — VARENICLINE TARTRATE 0.5 MG X 11 & 1 MG X 42 PO MISC: ORAL | Status: DC

## 2014-10-19 NOTE — Progress Notes (Signed)
Pre visit review using our clinic review tool, if applicable. No additional management support is needed unless otherwise documented below in the visit note.  Chief Complaint  Patient presents with  . Medicare Wellness    medication  meds     HPI: [name] 54 y.o. comes in today for Preventive Medicare wellness visit . And Chronic disease management. She is now cancer free 6 years. Mood :stable oncurrent meds as far as depression anxiety Neuro: cognition and neuropathy getting worse  Ion meds that  help sees Dr. Roddie Mc uses Ambien for sleep and Aricept which has helped her. She feels she processes slower and forgets things like where she put her keys. The numbness and sometimes pain can be problematic. Continues to smoke a half a pack per day and no she needs to stop it is very hard she was on Chantix long time ago when she was in the working world and it made her very irritable. However wants to try again under the different situation. She is taking high-dose vitamin D and hasn't had a level checked in a long time. She does have a history of bariatric surgery. She does have a history of falling she feels related to the neuropathy and her glasses bifocals when looking down not paying attention no major injury. Just got her eyes checked and glasses.    Health Maintenance  Topic Date Due  . HIV Screening  08/14/1975  . INFLUENZA VACCINE  12/12/2014  . MAMMOGRAM  07/11/2016  . COLONOSCOPY  12/05/2020  . TETANUS/TDAP  11/03/2023   Health Maintenance Review LIFESTYLE:  Exercise:   Walks  Tobacco/ETS: yes.  1/2 ppd  Onset 1980 Alcohol: per day less than one per day  Sugar beverages: not much  Sleep: 7 hours  Takes med sees dr D  Drug use: no Colonoscopy: Up-to-date MEDICARE DOCUMENT QUESTIONS  TO SCAN     Hearing:   no  Vision:  No limitations at present . Last eye check UTD eye exam .  Safety:  Has smoke detector and wears seat belts.  No firearms. No excess sun  exposure. Sees dentist regularly.  Falls:  From neuropathy .  Getting better   Advance directive :  Reviewed  Has one.  Memory:  Getting worse  function  Neuropathy getting worse   Depression: No anhedonia unusual crying or depressive symptoms  Nutrition: Eats well balanced diet; adequate calcium and vitamin D. No swallowing chewing problems.  Injury: no major injuries in the last six months.  Other healthcare providers:  Reviewed today .  Social:  Lives alone. 2 dog    Preventive parameters: up-to-date  Reviewed   ADLS:   There are no problems or need for assistance  driving, feeding, obtaining food, dressing, toileting and bathing, managing money using phone. She is independent. On disability for her neuropathy and cognitive changes.   ROS:  GEN/ HEENT: No fever, significant weight changes sweats headaches vision problems hearing changes, CV/ PULM; No chest pain shortness of breath cough, syncope,edema  change in exercise tolerance. GI /GU: No adominal pain, vomiting, change in bowel habits. No blood in the stool. No significant GU symptoms. SKIN/HEME: ,no acute skin rashes suspicious lesions or bleeding. No lymphadenopathy, nodules, masses.  NEURO/ PSYCH: see hpi . No depression anxiety. IMM/ Allergy: No unusual infections.  Allergy .   REST of 12 system review negative except as per HPI   Past Medical History  Diagnosis Date  . Hx pulmonary embolism   .  Obesity   . Metrorrhagia   . Allergic rhinitis   . Iron deficiency anemia   . Hypertension   . Alopecia   . Vitamin D deficiency   . HLD (hyperlipidemia)   . Adjustment disorder   . Anxiety and depression   . Raynaud's syndrome   . Right groin mass   . 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 Squamous cell carcinoma of skin   . Ovarian cancer 48  . Vision abnormalities   . Neuropathy   . 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    Family History    Problem Relation Age of Onset  . Pulmonary embolism Mother   . Hypertension Father   . Breast cancer Maternal Grandmother     diagnosed in early 60s  . Hyperlipidemia      History   Social History  . Marital Status: Divorced    Spouse Name: N/A  . Number of Children: 1  . Years of Education: Master's   Occupational History  .      disabled   Social History Main Topics  . Smoking status: Current Every Day Smoker -- 1.00 packs/day for 37 years    Types: Cigarettes    Start date: 06/16/1978  . Smokeless tobacco: Never Used     Comment: 2- 7-16  STILL SMOKING  . Alcohol Use: 0.0 oz/week    0 Standard drinks or equivalent per week     Comment: rare, 3 weekly  . Drug Use: No  . Sexual Activity: Not on file   Other Topics Concern  . None   Social History Narrative   Divorced   1 son /1 adopted adult son  2 dogs    Sales occupation on disability for now   Regular exercise-yes money stress   Daily caffeine use 2-3 and 2 cups of coffee   On since a security disability since 2012   Patient is right-handed.   Patient has a Master's degree.    Outpatient Encounter Prescriptions as of 10/19/2014  Medication Sig  . aspirin 81 MG tablet Take 81 mg by mouth daily. 2 TABS IN PM  . donepezil (ARICEPT) 10 MG tablet Take 1 tablet (10 mg total) by mouth every morning.  . DULoxetine (CYMBALTA) 60 MG capsule TAKE ONE CAPSULE BY MOUTH DAILY  . LYRICA 150 MG capsule TAKE 1 CAPSULE BY MOUTH TWICE DAILY  . Multiple Vitamin (MULTIVITAMIN WITH MINERALS) TABS tablet Take 1 tablet by mouth daily.  Marland Kitchen omega-3 acid ethyl esters (LOVAZA) 1 G capsule Take 1 g by mouth daily.  . orphenadrine (NORFLEX) 100 MG tablet TAKE 1 TABLET BY MOUTH TWICE DAILY AS NEEDED FOR MUSCLE SPASMS  . potassium chloride (K-DUR) 10 MEQ tablet TAKE 2 TABLETS BY MOUTH DAILY  . simvastatin (ZOCOR) 40 MG tablet TAKE 1 TABLET BY MOUTH EVERY DAY  . triamterene-hydrochlorothiazide (MAXZIDE-25) 37.5-25 MG per tablet TAKE 2  TABLET BY MOUTH EVERY DAY  . Vitamin D, Ergocalciferol, (DRISDOL) 50000 UNITS CAPS capsule TAKE 1 CAPSULE BY MOUTH ONCE WEEKLY  . zolpidem (AMBIEN CR) 12.5 MG CR tablet TAKE 1 TABLET BY MOUTH EVERY DAY AT BEDTIME  . varenicline (CHANTIX STARTING MONTH PAK) 0.5 MG X 11 & 1 MG X 42 tablet Take one 0.5 mg tablet by mouth once daily for 3 days, then increase to one 0.5 mg tablet twice daily for 4 days, then increase to one 1 mg tablet twice daily.  No facility-administered encounter medications on file as of 10/19/2014.    EXAM:  BP 118/80 mmHg  Temp(Src) 97.7 F (36.5 C) (Oral)  Ht 5' 7.5" (1.715 m)  Wt 231 lb 6.4 oz (104.962 kg)  BMI 35.69 kg/m2  Body mass index is 35.69 kg/(m^2).  Physical Exam: Vital signs reviewed during the interview sometimes she was playing with her phone candy crush. Then attended AXE:[facility] is a well-developed well-nourished alert cooperative   who appears stated age in no acute distress.  HEENT: normocephalic atraumatic , Eyes: PERRL EOM's full, conjunctiva clear,glasses  Nares: paten,t no deformity discharge or tenderness., Ears: no deformity EAC's clear TMs with normal landmarks. Mouth: clear OP, no lesions, edema.  Moist mucous membranes. Dentition in adequate repair. NECK: supple without masses, thyromegaly or bruits. CHEST/PULM:  Clear to auscultation and percussion breath sounds equal no wheeze , rales or rhonchi. No chest wall deformities or tenderness. Breast surgical scars no nodules obvious  Breast: normal by inspection . No dimpling, discharge, masses, tenderness or discharge . Well-healed scars CV: PMI is nondisplaced, S1 S2 no gallops, murmurs, rubs. Peripheral pulses are full without delay.No JVD .  ABDOMEN: Bowel sounds normal nontender  No guard or rebound, no hepato splenomegal no CVA tenderness.  No hernia. Large abdominal wall no obvious masses Extremtities:  No clubbing cyanosis or edema, no acute joint swelling or redness no focal atrophy NEURO:   Oriented x3, cranial nerves 3-12 appear to be intact, no obvious focal weakness,gaitsteady SKIN: No acute rashes normal turgor, color, no bruising or petechiae. PSYCH: Oriented, good eye contact, no obvious depression anxiety, cognition and judgment appear normal.slow speech but nl otheriwise  LN: no cervical axillary inguinal adenopathy No noted deficits in, attention, and speech.   Lab Results  Component Value Date   WBC 7.7 10/12/2014   HGB 15.0 10/12/2014   HCT 45.7 10/12/2014   PLT 213.0 10/12/2014   GLUCOSE 85 10/12/2014   CHOL 155 10/12/2014   TRIG 192.0* 10/12/2014   HDL 46.80 10/12/2014   LDLDIRECT 98.7 09/04/2009   LDLCALC 70 10/12/2014   ALT 25 10/12/2014   AST 27 10/12/2014   NA 135 10/12/2014   K 4.1 10/12/2014   CL 100 10/12/2014   CREATININE 1.04 10/12/2014   BUN 20 10/12/2014   CO2 30 10/12/2014   TSH 2.96 10/12/2014   INR 0.86 06/02/2013    ASSESSMENT AND PLAN:  Discussed the following assessment and plan:  Visit for preventive health examination - Plan: HIV antibody  Medicare annual wellness visit, subsequent  Medication management  BRCA gene positive  Adjustment disorder with mixed anxiety and depressed mood - med seems to help   Neuropathy due to chemotherapeutic drug  Hyperlipidemia  Neurocognitive deficits  Vitamin D deficiency - Plan: Vit D  25 hydroxy (rtn osteoporosis monitoring)  TOBACCO USE - cessation optinos counseling   Hx of falling Disc fall prevention neuropathy an vision distraction  Disc tobacco cessation wants to try chantix again had irritabiltuiy in past ttempt when stress at work also   Patches cause skin irritation and pain . On high dose vit d do lab today last dose 4 days ago  hiv screening  As indecated by protochol.  [facility] otherwise  Patient Care Team: Burnis Medin, MD as PCP - General (Internal Medicine) Gatha Mayer, MD (Gastroenterology) Tyrone Sage, PsyD (Psychiatry) Delila Pereyra, MD  (Obstetrics and Gynecology) [name], MD as Attending Physician (Internal Medicine) Larey Seat, MD (Neurology)  Patient Instructions  Can try chantix.   As discussed .  Healthy lifestyle includes : At least 150 minutes of exercise weeks  , weight at healthy levels, which is usually   BMI 19-25. Avoid trans fats and processed foods;  Increase fresh fruits and veges to 5 servings per day. And avoid sweet beverages including tea and juice. Mediterranean diet with olive oil and nuts have been noted to be heart and brain healthy . Avoid tobacco products . Limit  alcohol to  7 per week for women and 14 servings for men.  Get adequate sleep . Wear seat belts . Don't text and drive .   Fall Prevention and Home Safety Falls cause injuries and can affect all age groups. It is possible to use preventive measures to significantly decrease the likelihood of falls. There are many simple measures which can make your home safer and prevent falls. OUTDOORS  Repair cracks and edges of walkways and driveways.  Remove high doorway thresholds.  Trim shrubbery on the main path into your home.  Have good outside lighting.  Clear walkways of tools, rocks, debris, and clutter.  Check that handrails are not broken and are securely fastened. Both sides of steps should have handrails.  Have leaves, snow, and ice cleared regularly.  Use sand or salt on walkways during winter months.  In the garage, clean up grease or oil spills. BATHROOM  Install night lights.  Install grab bars by the toilet and in the tub and shower.  Use non-skid mats or decals in the tub or shower.  Place a plastic non-slip stool in the shower to sit on, if needed.  Keep floors dry and clean up all water on the floor immediately.  Remove soap buildup in the tub or shower on a regular basis.  Secure bath mats with non-slip, double-sided rug tape.  Remove throw rugs and tripping hazards from the  floors. BEDROOMS  Install night lights.  Make sure a bedside light is easy to reach.  Do not use oversized bedding.  Keep a telephone by your bedside.  Have a firm chair with side arms to use for getting dressed.  Remove throw rugs and tripping hazards from the floor. KITCHEN  Keep handles on pots and pans turned toward the center of the stove. Use back burners when possible.  Clean up spills quickly and allow time for drying.  Avoid walking on wet floors.  Avoid hot utensils and knives.  Position shelves so they are not too high or low.  Place commonly used objects within easy reach.  If necessary, use a sturdy step stool with a grab bar when reaching.  Keep electrical cables out of the way.  Do not use floor polish or wax that makes floors slippery. If you must use wax, use non-skid floor wax.  Remove throw rugs and tripping hazards from the floor. STAIRWAYS  Never leave objects on stairs.  Place handrails on both sides of stairways and use them. Fix any loose handrails. Make sure handrails on both sides of the stairways are as long as the stairs.  Check carpeting to make sure it is firmly attached along stairs. Make repairs to worn or loose carpet promptly.  Avoid placing throw rugs at the top or bottom of stairways, or properly secure the rug with carpet tape to prevent slippage. Get rid of throw rugs, if possible.  Have an electrician put in a light switch at the top and bottom of the stairs. OTHER FALL  PREVENTION TIPS  Wear low-heel or rubber-soled shoes that are supportive and fit well. Wear closed toe shoes.  When using a stepladder, make sure it is fully opened and both spreaders are firmly locked. Do not climb a closed stepladder.  Add color or contrast paint or tape to grab bars and handrails in your home. Place contrasting color strips on first and last steps.  Learn and use mobility aids as needed. Install an electrical emergency response  system.  Turn on lights to avoid dark areas. Replace light bulbs that burn out immediately. Get light switches that glow.  Arrange furniture to create clear pathways. Keep furniture in the same place.  Firmly attach carpet with non-skid or double-sided tape.  Eliminate uneven floor surfaces.  Select a carpet pattern that does not visually hide the edge of steps.  Be aware of all pets. OTHER HOME SAFETY TIPS  Set the water temperature for 120 F (48.8 C).  Keep emergency numbers on or near the telephone.  Keep smoke detectors on every level of the home and near sleeping areas. Document Released: 04/19/2002 Document Revised: 10/29/2011 Document Reviewed: 07/19/2011 [facility] Patient Information 2015 [place], [place]. This information is not intended to replace advice given to you by your health care provider. Make sure you discuss any questions you have with your health care provider.   Call fore refill chantix     Standley Brooking. Panosh M.D.

## 2014-10-19 NOTE — Patient Instructions (Addendum)
Can try chantix.   As discussed .  Healthy lifestyle includes : At least 150 minutes of exercise weeks  , weight at healthy levels, which is usually   BMI 19-25. Avoid trans fats and processed foods;  Increase fresh fruits and veges to 5 servings per day. And avoid sweet beverages including tea and juice. Mediterranean diet with olive oil and nuts have been noted to be heart and brain healthy . Avoid tobacco products . Limit  alcohol to  7 per week for women and 14 servings for men.  Get adequate sleep . Wear seat belts . Don't text and drive .   Fall Prevention and Home Safety Falls cause injuries and can affect all age groups. It is possible to use preventive measures to significantly decrease the likelihood of falls. There are many simple measures which can make your home safer and prevent falls. OUTDOORS  Repair cracks and edges of walkways and driveways.  Remove high doorway thresholds.  Trim shrubbery on the main path into your home.  Have good outside lighting.  Clear walkways of tools, rocks, debris, and clutter.  Check that handrails are not broken and are securely fastened. Both sides of steps should have handrails.  Have leaves, snow, and ice cleared regularly.  Use sand or salt on walkways during winter months.  In the garage, clean up grease or oil spills. BATHROOM  Install night lights.  Install grab bars by the toilet and in the tub and shower.  Use non-skid mats or decals in the tub or shower.  Place a plastic non-slip stool in the shower to sit on, if needed.  Keep floors dry and clean up all water on the floor immediately.  Remove soap buildup in the tub or shower on a regular basis.  Secure bath mats with non-slip, double-sided rug tape.  Remove throw rugs and tripping hazards from the floors. BEDROOMS  Install night lights.  Make sure a bedside light is easy to reach.  Do not use oversized bedding.  Keep a telephone by your  bedside.  Have a firm chair with side arms to use for getting dressed.  Remove throw rugs and tripping hazards from the floor. KITCHEN  Keep handles on pots and pans turned toward the center of the stove. Use back burners when possible.  Clean up spills quickly and allow time for drying.  Avoid walking on wet floors.  Avoid hot utensils and knives.  Position shelves so they are not too high or low.  Place commonly used objects within easy reach.  If necessary, use a sturdy step stool with a grab bar when reaching.  Keep electrical cables out of the way.  Do not use floor polish or wax that makes floors slippery. If you must use wax, use non-skid floor wax.  Remove throw rugs and tripping hazards from the floor. STAIRWAYS  Never leave objects on stairs.  Place handrails on both sides of stairways and use them. Fix any loose handrails. Make sure handrails on both sides of the stairways are as long as the stairs.  Check carpeting to make sure it is firmly attached along stairs. Make repairs to worn or loose carpet promptly.  Avoid placing throw rugs at the top or bottom of stairways, or properly secure the rug with carpet tape to prevent slippage. Get rid of throw rugs, if possible.  Have an electrician put in a light switch at the top and bottom of the stairs. OTHER FALL PREVENTION TIPS  Wear low-heel or rubber-soled shoes that are supportive and fit well. Wear closed toe shoes.  When using a stepladder, make sure it is fully opened and both spreaders are firmly locked. Do not climb a closed stepladder.  Add color or contrast paint or tape to grab bars and handrails in your home. Place contrasting color strips on first and last steps.  Learn and use mobility aids as needed. Install an electrical emergency response system.  Turn on lights to avoid dark areas. Replace light bulbs that burn out immediately. Get light switches that glow.  Arrange furniture to create clear  pathways. Keep furniture in the same place.  Firmly attach carpet with non-skid or double-sided tape.  Eliminate uneven floor surfaces.  Select a carpet pattern that does not visually hide the edge of steps.  Be aware of all pets. OTHER HOME SAFETY TIPS  Set the water temperature for 120 F (48.8 C).  Keep emergency numbers on or near the telephone.  Keep smoke detectors on every level of the home and near sleeping areas. Document Released: 04/19/2002 Document Revised: 10/29/2011 Document Reviewed: 07/19/2011 [facility] Patient Information 2015 [place], [place]. This information is not intended to replace advice given to you by your health care provider. Make sure you discuss any questions you have with your health care provider.   Call fore refill chantix

## 2014-10-20 LAB — HIV ANTIBODY (ROUTINE TESTING W REFLEX): HIV 1&2 Ab, 4th Generation: NONREACTIVE

## 2014-10-31 ENCOUNTER — Other Ambulatory Visit: Payer: Self-pay | Admitting: Family Medicine

## 2014-10-31 ENCOUNTER — Telehealth: Payer: Self-pay | Admitting: Family Medicine

## 2014-10-31 DIAGNOSIS — E559 Vitamin D deficiency, unspecified: Secondary | ICD-10-CM

## 2014-10-31 NOTE — Telephone Encounter (Signed)
WP would like to check the patient's Vitamin D level in 4-6 months.  Please schedule with the pt.  I have placed the order for lab.  Thanks!

## 2014-10-31 NOTE — Telephone Encounter (Signed)
Pt is driving and will callback to sch

## 2014-11-07 ENCOUNTER — Ambulatory Visit (HOSPITAL_BASED_OUTPATIENT_CLINIC_OR_DEPARTMENT_OTHER): Payer: Medicare Other

## 2014-11-07 ENCOUNTER — Encounter: Payer: Self-pay | Admitting: Family

## 2014-11-07 ENCOUNTER — Ambulatory Visit (HOSPITAL_BASED_OUTPATIENT_CLINIC_OR_DEPARTMENT_OTHER): Payer: Medicare Other | Admitting: Family

## 2014-11-07 ENCOUNTER — Other Ambulatory Visit (HOSPITAL_BASED_OUTPATIENT_CLINIC_OR_DEPARTMENT_OTHER): Payer: Medicare Other

## 2014-11-07 ENCOUNTER — Other Ambulatory Visit: Payer: Self-pay | Admitting: Lab

## 2014-11-07 ENCOUNTER — Ambulatory Visit: Payer: Self-pay | Admitting: Hematology & Oncology

## 2014-11-07 VITALS — BP 127/87 | HR 79 | Temp 97.8°F | Resp 18

## 2014-11-07 VITALS — Wt 230.0 lb

## 2014-11-07 DIAGNOSIS — D500 Iron deficiency anemia secondary to blood loss (chronic): Secondary | ICD-10-CM | POA: Diagnosis not present

## 2014-11-07 DIAGNOSIS — Z1501 Genetic susceptibility to malignant neoplasm of breast: Secondary | ICD-10-CM

## 2014-11-07 DIAGNOSIS — Z1502 Genetic susceptibility to malignant neoplasm of ovary: Secondary | ICD-10-CM

## 2014-11-07 DIAGNOSIS — Z1509 Genetic susceptibility to other malignant neoplasm: Secondary | ICD-10-CM

## 2014-11-07 DIAGNOSIS — Z8543 Personal history of malignant neoplasm of ovary: Secondary | ICD-10-CM | POA: Diagnosis not present

## 2014-11-07 LAB — CBC WITH DIFFERENTIAL (CANCER CENTER ONLY)
BASO#: 0 10*3/uL (ref 0.0–0.2)
BASO%: 0.1 % (ref 0.0–2.0)
EOS%: 1.7 % (ref 0.0–7.0)
Eosinophils Absolute: 0.1 10*3/uL (ref 0.0–0.5)
HEMATOCRIT: 43.4 % (ref 34.8–46.6)
HEMOGLOBIN: 14.9 g/dL (ref 11.6–15.9)
LYMPH#: 2.1 10*3/uL (ref 0.9–3.3)
LYMPH%: 30.1 % (ref 14.0–48.0)
MCH: 30 pg (ref 26.0–34.0)
MCHC: 34.3 g/dL (ref 32.0–36.0)
MCV: 88 fL (ref 81–101)
MONO#: 0.5 10*3/uL (ref 0.1–0.9)
MONO%: 6.5 % (ref 0.0–13.0)
NEUT#: 4.3 10*3/uL (ref 1.5–6.5)
NEUT%: 61.6 % (ref 39.6–80.0)
Platelets: 228 10*3/uL (ref 145–400)
RBC: 4.96 10*6/uL (ref 3.70–5.32)
RDW: 14.8 % (ref 11.1–15.7)
WBC: 6.9 10*3/uL (ref 3.9–10.0)

## 2014-11-07 LAB — CMP (CANCER CENTER ONLY)
ALBUMIN: 3.9 g/dL (ref 3.3–5.5)
ALK PHOS: 77 U/L (ref 26–84)
ALT: 29 U/L (ref 10–47)
AST: 32 U/L (ref 11–38)
BILIRUBIN TOTAL: 0.7 mg/dL (ref 0.20–1.60)
BUN, Bld: 19 mg/dL (ref 7–22)
CO2: 29 mEq/L (ref 18–33)
Calcium: 10.1 mg/dL (ref 8.0–10.3)
Chloride: 101 mEq/L (ref 98–108)
Creat: 1.2 mg/dl (ref 0.6–1.2)
Glucose, Bld: 85 mg/dL (ref 73–118)
POTASSIUM: 3.6 meq/L (ref 3.3–4.7)
SODIUM: 140 meq/L (ref 128–145)
TOTAL PROTEIN: 7.1 g/dL (ref 6.4–8.1)

## 2014-11-07 MED ORDER — SODIUM CHLORIDE 0.9 % IJ SOLN
10.0000 mL | INTRAMUSCULAR | Status: DC | PRN
  Administered 2014-11-07: 10 mL via INTRAVENOUS
  Filled 2014-11-07: qty 10

## 2014-11-07 MED ORDER — HEPARIN SOD (PORK) LOCK FLUSH 100 UNIT/ML IV SOLN
500.0000 [IU] | Freq: Once | INTRAVENOUS | Status: AC
  Administered 2014-11-07: 500 [IU] via INTRAVENOUS
  Filled 2014-11-07: qty 5

## 2014-11-07 NOTE — Progress Notes (Signed)
Hematology and Oncology Follow Up Visit  [name] [id number] 1961/01/26 54 y.o. 11/07/2014   Principle Diagnosis:  1. 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 2. BRCA1 positive. 3. Severe chemotherapy-induced neuropathy. 4. Intermittent iron-deficiency anemia.  Current Therapy:   Observation    Interim History:  [name] is here today for a follow-up. She has had a few falls but thankfully has not gotten hurt. She has some problems with muscle spasms and tightness in her thighs which causes her to stumble. She has an appointment with Neurology later in July. She feels that the neuropathy in her hands and feet is less painful but still has numbness.   She still has the short term memory loss and is taking Aricept daily. This is still the same. Unfortunately this issue has caused her to lose her job. She has had no problem with infections. No fever, chills, n/v, cough, rash, dizziness, SOB, chest pain, papitations, abdominal pain, constipation, diarrhea, blood in urine or stool.  She is still smoking 1/2 ppd but is thinking of starting Chantix on September 1st.  She is eating healthy and staying hydrated. Her weight is stable.   Medications:    Medication List       This list is accurate as of: 11/07/14  3:31 PM.  Always use your most recent med list.               aspirin 81 MG tablet  Take 81 mg by mouth daily. 2 TABS IN PM     donepezil 10 MG tablet  Commonly known as:  ARICEPT  Take 1 tablet (10 mg total) by mouth every morning.     DULoxetine 60 MG capsule  Commonly known as:  CYMBALTA  TAKE ONE CAPSULE BY MOUTH DAILY     LYRICA 150 MG capsule  Generic drug:  pregabalin  TAKE 1 CAPSULE BY MOUTH TWICE DAILY     multivitamin with minerals Tabs tablet  Take 1 tablet by mouth daily.     omega-3 acid ethyl esters 1 G capsule  Commonly known as:  LOVAZA  Take 1 g by mouth daily.     orphenadrine 100 MG tablet  Commonly known as:   NORFLEX  TAKE 1 TABLET BY MOUTH TWICE DAILY AS NEEDED FOR MUSCLE SPASMS     potassium chloride 10 MEQ tablet  Commonly known as:  K-DUR  TAKE 2 TABLETS BY MOUTH DAILY     simvastatin 40 MG tablet  Commonly known as:  ZOCOR  TAKE 1 TABLET BY MOUTH EVERY DAY     triamterene-hydrochlorothiazide 37.5-25 MG per tablet  Commonly known as:  MAXZIDE-25  TAKE 2 TABLET BY MOUTH EVERY DAY     varenicline 0.5 MG X 11 & 1 MG X 42 tablet  Commonly known as:  CHANTIX STARTING MONTH PAK  Take one 0.5 mg tablet by mouth once daily for 3 days, then increase to one 0.5 mg tablet twice daily for 4 days, then increase to one 1 mg tablet twice daily.     Vitamin D (Ergocalciferol) 50000 UNITS Caps capsule  Commonly known as:  DRISDOL  TAKE 1 CAPSULE BY MOUTH ONCE WEEKLY     zolpidem 12.5 MG CR tablet  Commonly known as:  AMBIEN CR  TAKE 1 TABLET BY MOUTH EVERY DAY AT BEDTIME        Allergies:  Allergies  Allergen Reactions  . Codeine Nausea Only    REACTION: Nausea  . Orange Itching and Rash  Past Medical History, Surgical history, Social history, and Family History were reviewed and updated.  Review of Systems: All other 10 point review of systems is negative.   Physical Exam:  weight is 230 lb (104.327 kg).   Wt Readings from Last 3 Encounters:  11/07/14 230 lb (104.327 kg)  10/19/14 231 lb 6.4 oz (104.962 kg)  07/18/14 242 lb (109.77 kg)    Ocular: Sclerae unicteric, pupils equal, round and reactive to light Ear-nose-throat: Oropharynx clear, dentition fair Lymphatic: No cervical or supraclavicular adenopathy Lungs no rales or rhonchi, good excursion bilaterally Heart regular rate and rhythm, no murmur appreciated Abd soft, nontender, positive bowel sounds MSK no focal spinal tenderness, no joint edema Neuro: non-focal, well-oriented, appropriate affect Breasts: Deferred   Lab Results  Component Value Date   WBC 6.9 11/07/2014   HGB 14.9 11/07/2014   HCT 43.4  11/07/2014   MCV 88 11/07/2014   PLT 228 11/07/2014   Lab Results  Component Value Date   FERRITIN 57 07/18/2014   IRON 68 07/18/2014   TIBC 293 07/18/2014   UIBC 224 07/18/2014   IRONPCTSAT 23 07/18/2014   Lab Results  Component Value Date   RBC 4.96 11/07/2014   No results found for: KPAFRELGTCHN, LAMBDASER, KAPLAMBRATIO No results found for: IGGSERUM, IGA, IGMSERUM No results found for: Odetta Pink, SPEI   Chemistry      Component Value Date/Time   NA 140 11/07/2014 1411   NA 135 10/12/2014 1123   K 3.6 11/07/2014 1411   K 4.1 10/12/2014 1123   CL 101 11/07/2014 1411   CL 100 10/12/2014 1123   CO2 29 11/07/2014 1411   CO2 30 10/12/2014 1123   BUN 19 11/07/2014 1411   BUN 20 10/12/2014 1123   CREATININE 1.2 11/07/2014 1411   CREATININE 1.04 10/12/2014 1123      Component Value Date/Time   CALCIUM 10.1 11/07/2014 1411   CALCIUM 9.8 10/12/2014 1123   ALKPHOS 77 11/07/2014 1411   ALKPHOS 79 10/12/2014 1123   AST 32 11/07/2014 1411   AST 27 10/12/2014 1123   ALT 29 11/07/2014 1411   ALT 25 10/12/2014 1123   BILITOT 0.70 11/07/2014 1411   BILITOT 0.5 10/12/2014 1123     Impression and Plan: [name] is a very pleasant 54 yo [place] American female with a history of poorly differentiated carcinoma of the abdomen.She is BRCA1 positive. She is in remission. She had 6 cycles of chemo with Carboplatinum and Taxotere and has been in remission now for over 6 years. She has also had a hysterectomy. She is doing well despite some neuropathy and short term memory loss that developed with the chemo. She is followed closely by neurology.  Her CBC and CMP today looked good.  We will plan to see her back in 3 months for labs and follow-up.  She knows to call here with any questions or concerns. We can certainly see her sooner if need be.   Eliezer Bottom, NP 6/27/20163:31 PM

## 2014-11-17 ENCOUNTER — Other Ambulatory Visit: Payer: Self-pay | Admitting: Hematology & Oncology

## 2014-11-17 ENCOUNTER — Other Ambulatory Visit: Payer: Self-pay | Admitting: Internal Medicine

## 2014-11-21 NOTE — Telephone Encounter (Signed)
Ok x 1

## 2014-11-21 NOTE — Telephone Encounter (Signed)
Sent to the pharmacy by e-scribe. 

## 2014-11-23 ENCOUNTER — Ambulatory Visit (INDEPENDENT_AMBULATORY_CARE_PROVIDER_SITE_OTHER): Payer: Medicare Other | Admitting: Adult Health

## 2014-11-23 ENCOUNTER — Encounter: Payer: Self-pay | Admitting: Adult Health

## 2014-11-23 ENCOUNTER — Other Ambulatory Visit: Payer: Self-pay

## 2014-11-23 ENCOUNTER — Telehealth: Payer: Self-pay | Admitting: Adult Health

## 2014-11-23 VITALS — BP 127/91 | HR 79 | Ht 67.0 in | Wt 237.0 lb

## 2014-11-23 DIAGNOSIS — R4189 Other symptoms and signs involving cognitive functions and awareness: Secondary | ICD-10-CM | POA: Diagnosis not present

## 2014-11-23 DIAGNOSIS — I2729 Other secondary pulmonary hypertension: Secondary | ICD-10-CM

## 2014-11-23 DIAGNOSIS — G4700 Insomnia, unspecified: Secondary | ICD-10-CM | POA: Diagnosis not present

## 2014-11-23 DIAGNOSIS — G620 Drug-induced polyneuropathy: Secondary | ICD-10-CM

## 2014-11-23 MED ORDER — ZOLPIDEM TARTRATE ER 12.5 MG PO TBCR: 12.5000 mg | EXTENDED_RELEASE_TABLET | Freq: Every day | ORAL | Status: DC

## 2014-11-23 NOTE — Patient Instructions (Signed)
Continue Ambien, Lyrica and Aricept Memory score is stable.

## 2014-11-23 NOTE — Telephone Encounter (Signed)
She has not tried oxygen- only did the home sleep study. She was asking today because her dentist made mention that her O2 dropped while doing a dental procedure.

## 2014-11-23 NOTE — Progress Notes (Signed)
I agree with the assessment and plan as directed by NP .The patient is known to me .   Aryanne Gilleland, MD  

## 2014-11-23 NOTE — Telephone Encounter (Signed)
i have to review that. Did she feel better while  using oxygen ?  If yes, i will write for 02.

## 2014-11-23 NOTE — Progress Notes (Signed)
PATIENT: [name] DOB: 1961/02/10  REASON FOR VISIT: follow up- neuropathy, cognitive changes, insomnia HISTORY FROM: patient  HISTORY OF PRESENT ILLNESS: [name] is a 54 year old female with a history of neuropathy,cognitive changes and insomnia. She returns today for follow-up. The patient continues to take Ambien for insomnia. The patient states that if she does not take this medication she will not be able to go to sleep. The patient currently takes Lyrica 150 mg twice a day for neuropathy. Patient states that the burning and tingling is located in the toes extending to the mid foot bilaterally. She also has burning and tingling in the fingers that extends to the mid hand. Denies any changes with her gait or balance. That she had a fall while standing and watching fireworks. She states that the right leg went numb and cause her to fall. She states that this is the first time this has happened. She states that the numbness resolved. He states that she is not had any additional episodes of numbness. Patient feels that her memory has remained same. She states that she is able to do simple tasks but things that require more complex thinking she has difficulty with. For example she has trouble following plots in a TV show. She states that she has directions for how to make coffee posted. She is able to complete all ADLs independently. She operates a Teacher, music without difficulty. She states that occasionally she'll take a wrong turn to a familiar place. She continues to take Aricept 10 mg daily. Patient states that she did have a sleep study area and she is unsure if she should be wearing oxygen at bedtime. She states that she had a dental procedure and the technician noted that her oxygen saturation dropped while she was sleeping. She denies any new neurological issues. She returns today for an evaluation.  HISTORY 05/18/14 (CD): [name] is here today for her yearly revisit  she is doing remarkably well knowing about her history of metastatic cancer with unknown primary to more she seems to have battled her disease very well.  She has lost some weight since her last visit, her blood pressure is well controlled. The HST study performed on 10-23-13 showed obstructive sleep apnea not to be present, but some hypoxemia at night there were 109 minutes of oxygenation at 88% or less. Since this test was a home sleep test there is a higher likelihood of artifact. The patient does 1-2 times a night feel air hungry. She does wake occasionally up with a feeling that she doesn't breath enough. And her heart will be racing- palpitations have also not been seen in her home sleep test. She described sudden falls still being a concern, neuropathy contributing to this.  her cognition is improved, by her own subjective assessment as well as MOCA test today : scored 26 out of 30 points. This is a normal range for her cognitive function. Ambien dependent , unable to go to sleep without sleep aid for several years now.  Her list of medication was reviewed and there have been no recent changes. Her dog is her main social contact , besides her church family.   REVIEW OF SYSTEMS: Out of a complete 14 system review of symptoms, the patient complains only of the following symptoms, and all other reviewed systems are negative.  Heat intolerance, excessive sweating, insomnia, daytime sleepiness, joint pain, memory loss, numbness  ALLERGIES: Allergies  Allergen Reactions  . Codeine Nausea Only  REACTION: Nausea  . Orange Itching and Rash    HOME MEDICATIONS: Outpatient Prescriptions Prior to Visit  Medication Sig Dispense Refill  . aspirin 81 MG tablet Take 81 mg by mouth daily. 2 TABS IN PM    . donepezil (ARICEPT) 10 MG tablet Take 1 tablet (10 mg total) by mouth every morning. 90 tablet 2  . DULoxetine (CYMBALTA) 60 MG capsule TAKE ONE CAPSULE BY MOUTH EVERY DAY 30 capsule  0  . LYRICA 150 MG capsule TAKE 1 CAPSULE BY MOUTH TWICE DAILY 60 capsule 0  . Multiple Vitamin (MULTIVITAMIN WITH MINERALS) TABS tablet Take 1 tablet by mouth daily.    Marland Kitchen omega-3 acid ethyl esters (LOVAZA) 1 G capsule Take 1 g by mouth daily.    . orphenadrine (NORFLEX) 100 MG tablet TAKE 1 TABLET BY MOUTH TWICE DAILY AS NEEDED FOR MUSCLE SPASMS 60 tablet 0  . potassium chloride (K-DUR) 10 MEQ tablet TAKE 2 TABLETS BY MOUTH DAILY 60 tablet 1  . simvastatin (ZOCOR) 40 MG tablet TAKE 1 TABLET BY MOUTH EVERY DAY 30 tablet 1  . triamterene-hydrochlorothiazide (MAXZIDE-25) 37.5-25 MG per tablet TAKE 2 TABLET BY MOUTH EVERY DAY 60 tablet 11  . varenicline (CHANTIX STARTING MONTH PAK) 0.5 MG X 11 & 1 MG X 42 tablet Take one 0.5 mg tablet by mouth once daily for 3 days, then increase to one 0.5 mg tablet twice daily for 4 days, then increase to one 1 mg tablet twice daily. 53 tablet 0  . Vitamin D, Ergocalciferol, (DRISDOL) 50000 UNITS CAPS capsule TAKE 1 CAPSULE BY MOUTH ONCE WEEKLY 12 capsule 0  . zolpidem (AMBIEN CR) 12.5 MG CR tablet TAKE 1 TABLET BY MOUTH EVERY DAY AT BEDTIME 30 tablet 2   No facility-administered medications prior to visit.    PAST MEDICAL HISTORY: Past Medical History  Diagnosis Date  . Hx pulmonary embolism   . Obesity   . Metrorrhagia   . Allergic rhinitis   . Iron deficiency anemia   . Hypertension   . Alopecia   . Vitamin D deficiency   . HLD (hyperlipidemia)   . Adjustment disorder   . Anxiety and depression   . Raynaud's syndrome   . Right groin mass   . 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 Squamous cell carcinoma of skin   . Ovarian cancer 48  . Vision abnormalities   . Neuropathy   . 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    PAST SURGICAL HISTORY: Past Surgical History  Procedure Laterality Date  . Breast reduction surgery  2000  . Gastric bypass  1979  . Tonsillectomy    .  Cesarean section      1991  . Abdominal hysterectomy  05/2008    TAH/BSO  . Colonoscopy  2004; 12/07/10    hemorrhoids    FAMILY HISTORY: Family History  Problem Relation Age of Onset  . Pulmonary embolism Mother   . Hypertension Father   . Breast cancer Maternal Grandmother     diagnosed in early 61s  . Hyperlipidemia      SOCIAL HISTORY: History   Social History  . Marital Status: Divorced    Spouse Name: N/A  . Number of Children: 1  . Years of Education: Master's   Occupational History  .      disabled   Social History Main Topics  . Smoking status: Current Every Day Smoker -- 1.00 packs/day for 37  years    Types: Cigarettes    Start date: 06/16/1978  . Smokeless tobacco: Never Used     Comment: 2- 7-16  STILL SMOKING  . Alcohol Use: 0.0 oz/week    0 Standard drinks or equivalent per week     Comment: rare, 3 weekly  . Drug Use: No  . Sexual Activity: Not on file   Other Topics Concern  . Not on file   Social History Narrative   Divorced   1 son /1 adopted adult son  2 dogs    Sales occupation on disability for now   Regular exercise-yes money stress   Daily caffeine use 2-3 and 2 cups of coffee   On since a security disability since 2012   Patient is right-handed.   Patient has a Master's degree.      PHYSICAL EXAM  Filed Vitals:   11/23/14 1432  BP: 127/91  Pulse: 79  Height: 5' 7" (1.702 m)  Weight: 237 lb (107.502 kg)   Body mass index is 37.11 kg/(m^2).  Generalized: Well developed, in no acute distress   Neurological examination  Mentation: Alert oriented to time, place, history taking. Follows all commands speech and language fluent. MOCA 26/30 Cranial nerve II-XII: Pupils were equal round reactive to light. Extraocular movements were full, visual field were full on confrontational test. Facial sensation and strength were normal. Uvula tongue midline. Head turning and shoulder shrug  were normal and symmetric. Motor: The motor  testing reveals 5 over 5 strength of all 4 extremities. Good symmetric motor tone is noted throughout.  Sensory: Sensory testing is intact to soft touch on all 4 extremities. No evidence of extinction is noted.  Coordination: Cerebellar testing reveals good finger-nose-finger and heel-to-shin bilaterally.  Gait and station: Gait is normal. Tandem gait is normal. Romberg is negative. No drift is seen.  Reflexes: Deep tendon reflexes are symmetric and normal bilaterally.   DIAGNOSTIC DATA (LABS, IMAGING, TESTING) - I reviewed patient records, labs, notes, testing and imaging myself where available.  Lab Results  Component Value Date   WBC 6.9 11/07/2014   HGB 14.9 11/07/2014   HCT 43.4 11/07/2014   MCV 88 11/07/2014   PLT 228 11/07/2014      Component Value Date/Time   NA 140 11/07/2014 1411   NA 135 10/12/2014 1123   K 3.6 11/07/2014 1411   K 4.1 10/12/2014 1123   CL 101 11/07/2014 1411   CL 100 10/12/2014 1123   CO2 29 11/07/2014 1411   CO2 30 10/12/2014 1123   GLUCOSE 85 11/07/2014 1411   GLUCOSE 85 10/12/2014 1123   BUN 19 11/07/2014 1411   BUN 20 10/12/2014 1123   CREATININE 1.2 11/07/2014 1411   CREATININE 1.04 10/12/2014 1123   CALCIUM 10.1 11/07/2014 1411   CALCIUM 9.8 10/12/2014 1123   PROT 7.1 11/07/2014 1411   PROT 7.1 10/12/2014 1123   ALBUMIN 4.1 10/12/2014 1123   AST 32 11/07/2014 1411   AST 27 10/12/2014 1123   ALT 29 11/07/2014 1411   ALT 25 10/12/2014 1123   ALKPHOS 77 11/07/2014 1411   ALKPHOS 79 10/12/2014 1123   BILITOT 0.70 11/07/2014 1411   BILITOT 0.5 10/12/2014 1123   GFRNONAA 75.77 09/04/2009 0943   GFRAA  07/06/2009 1722    >60        The eGFR has been calculated using the MDRD equation. This calculation has not been validated in all clinical situations. eGFR's persistently <60 mL/min signify possible Chronic Kidney  Disease.   Lab Results  Component Value Date   CHOL 155 10/12/2014   HDL 46.80 10/12/2014   LDLCALC 70 10/12/2014    LDLDIRECT 98.7 09/04/2009   TRIG 192.0* 10/12/2014   CHOLHDL 3 10/12/2014    ASSESSMENT AND PLAN 54 y.o. year old female  has a past medical history of pulmonary embolism; Obesity; Metrorrhagia; Allergic rhinitis; Iron deficiency anemia; Hypertension; Alopecia; Vitamin D deficiency; HLD (hyperlipidemia); Adjustment disorder; Anxiety and depression; Raynaud's syndrome; Right groin mass; Adenocarcinoma; Squamous cell carcinoma of skin; Ovarian cancer (48); Vision abnormalities; Neuropathy; BRCA1 positive (01/27/2013); and Ovarian cancer genetic susceptibility (01/27/2013). here with:  1. Insomnia 2. Cognitive changes 3. Neuropathy   She will continue taking Ambien for insomnia. I will refill this today. Patient's memory has remained stable. Her MOCA is 26/30 was recently 26/30. We will continue to monitor her memory. She should continue Aricept 10 mg daily. Patient's neuropathy has remained stable. She continues on Lyrica 150 mg twice a day. She did have a recent fall due to numbness in the right leg. She states that this numbness has not reoccurred. We will continue to monitor this. Patient is questioning whether she should use oxygen at night after a sleep study. I will consult with Dr. Brett Fairy for her recommendations and my assistant will call the patient. She will follow-up in 6 months or sooner if needed.    Ward Givens, MSN, NP-C 11/23/2014, 2:28 PM [facility] [address] [place], [place] [address] [phone]  Note: This document was prepared with digital dictation and possible smart phrase technology. Any transcriptional errors that result from this process are unintentional.

## 2014-11-23 NOTE — Telephone Encounter (Signed)
I saw the patient in clinic today. She is questioning if she should be wearing oxygen at night. It appears that oxygen was recommended on the home sleep study? But there was some question of artifact? Does she need oxygen at night? Please advise.

## 2014-11-23 NOTE — Addendum Note (Signed)
Addended by: Larey Seat on: 11/23/2014 04:38 PM   Modules accepted: Orders

## 2014-11-24 ENCOUNTER — Other Ambulatory Visit: Payer: Self-pay

## 2014-11-24 DIAGNOSIS — R0902 Hypoxemia: Secondary | ICD-10-CM

## 2014-12-07 ENCOUNTER — Telehealth: Payer: Self-pay

## 2014-12-07 DIAGNOSIS — Z85828 Personal history of other malignant neoplasm of skin: Secondary | ICD-10-CM | POA: Diagnosis not present

## 2014-12-07 DIAGNOSIS — L821 Other seborrheic keratosis: Secondary | ICD-10-CM | POA: Diagnosis not present

## 2014-12-07 DIAGNOSIS — D225 Melanocytic nevi of trunk: Secondary | ICD-10-CM | POA: Diagnosis not present

## 2014-12-07 NOTE — Telephone Encounter (Signed)
Called pt to tell her that no supplemental o2 is needed based on her ONO results reviewed by Dr. Brett Fairy. Pt verbalized understanding.

## 2014-12-19 ENCOUNTER — Other Ambulatory Visit: Payer: Self-pay | Admitting: Internal Medicine

## 2014-12-20 NOTE — Telephone Encounter (Signed)
Sent to the pharmacy by e-scribe. 

## 2014-12-21 ENCOUNTER — Ambulatory Visit (HOSPITAL_BASED_OUTPATIENT_CLINIC_OR_DEPARTMENT_OTHER): Payer: Medicare Other

## 2014-12-21 DIAGNOSIS — Z452 Encounter for adjustment and management of vascular access device: Secondary | ICD-10-CM | POA: Diagnosis not present

## 2014-12-21 DIAGNOSIS — Z8543 Personal history of malignant neoplasm of ovary: Secondary | ICD-10-CM | POA: Diagnosis not present

## 2014-12-21 DIAGNOSIS — Z95828 Presence of other vascular implants and grafts: Secondary | ICD-10-CM

## 2014-12-21 MED ORDER — HEPARIN SOD (PORK) LOCK FLUSH 100 UNIT/ML IV SOLN
500.0000 [IU] | Freq: Once | INTRAVENOUS | Status: AC
  Administered 2014-12-21: 500 [IU] via INTRAVENOUS
  Filled 2014-12-21: qty 5

## 2014-12-21 MED ORDER — SODIUM CHLORIDE 0.9 % IJ SOLN
10.0000 mL | INTRAMUSCULAR | Status: DC | PRN
  Administered 2014-12-21: 10 mL via INTRAVENOUS
  Filled 2014-12-21: qty 10

## 2014-12-21 NOTE — Patient Instructions (Signed)

## 2014-12-28 ENCOUNTER — Other Ambulatory Visit: Payer: Self-pay | Admitting: Internal Medicine

## 2014-12-28 ENCOUNTER — Other Ambulatory Visit: Payer: Self-pay | Admitting: Hematology & Oncology

## 2014-12-28 ENCOUNTER — Other Ambulatory Visit: Payer: Self-pay | Admitting: Family Medicine

## 2014-12-28 NOTE — Telephone Encounter (Signed)
Ok to refill 1 year for  Simvastatin   Refill x 1 for norflex

## 2014-12-28 NOTE — Telephone Encounter (Signed)
This refill was in Dr. Servando Snare.

## 2014-12-28 NOTE — Telephone Encounter (Signed)
Ok to refill x 6 months

## 2014-12-29 NOTE — Telephone Encounter (Signed)
Sent to the pharmacy by e-scribe. 

## 2014-12-29 NOTE — Telephone Encounter (Signed)
Called to the pharmacy and left on machine. 

## 2015-02-07 ENCOUNTER — Telehealth: Payer: Self-pay | Admitting: Hematology & Oncology

## 2015-02-07 NOTE — Telephone Encounter (Signed)
Patient called and cx 02/08/15 apt and resch for 02/14/15

## 2015-02-08 ENCOUNTER — Other Ambulatory Visit: Payer: Managed Care, Other (non HMO)

## 2015-02-08 ENCOUNTER — Ambulatory Visit: Payer: Self-pay | Admitting: Hematology & Oncology

## 2015-02-14 ENCOUNTER — Ambulatory Visit: Payer: Medicare Other

## 2015-02-14 ENCOUNTER — Other Ambulatory Visit (HOSPITAL_BASED_OUTPATIENT_CLINIC_OR_DEPARTMENT_OTHER): Payer: Medicare Other

## 2015-02-14 ENCOUNTER — Encounter: Payer: Self-pay | Admitting: Hematology & Oncology

## 2015-02-14 ENCOUNTER — Ambulatory Visit (HOSPITAL_BASED_OUTPATIENT_CLINIC_OR_DEPARTMENT_OTHER): Payer: Medicare Other | Admitting: Hematology & Oncology

## 2015-02-14 VITALS — BP 122/90 | HR 85 | Temp 97.4°F | Resp 16 | Ht 67.0 in | Wt 226.0 lb

## 2015-02-14 DIAGNOSIS — Z1501 Genetic susceptibility to malignant neoplasm of breast: Secondary | ICD-10-CM

## 2015-02-14 DIAGNOSIS — L74519 Primary focal hyperhidrosis, unspecified: Secondary | ICD-10-CM | POA: Diagnosis not present

## 2015-02-14 DIAGNOSIS — R61 Generalized hyperhidrosis: Secondary | ICD-10-CM

## 2015-02-14 DIAGNOSIS — Z1502 Genetic susceptibility to malignant neoplasm of ovary: Secondary | ICD-10-CM

## 2015-02-14 DIAGNOSIS — G629 Polyneuropathy, unspecified: Secondary | ICD-10-CM | POA: Diagnosis not present

## 2015-02-14 DIAGNOSIS — N951 Menopausal and female climacteric states: Secondary | ICD-10-CM

## 2015-02-14 DIAGNOSIS — Z1509 Genetic susceptibility to other malignant neoplasm: Secondary | ICD-10-CM

## 2015-02-14 DIAGNOSIS — E012 Iodine-deficiency related (endemic) goiter, unspecified: Secondary | ICD-10-CM | POA: Diagnosis not present

## 2015-02-14 DIAGNOSIS — D500 Iron deficiency anemia secondary to blood loss (chronic): Secondary | ICD-10-CM

## 2015-02-14 DIAGNOSIS — E559 Vitamin D deficiency, unspecified: Secondary | ICD-10-CM

## 2015-02-14 DIAGNOSIS — Z8589 Personal history of malignant neoplasm of other organs and systems: Secondary | ICD-10-CM | POA: Diagnosis not present

## 2015-02-14 DIAGNOSIS — Z8543 Personal history of malignant neoplasm of ovary: Secondary | ICD-10-CM

## 2015-02-14 LAB — TSH: TSH: 3.043 u[IU]/mL (ref 0.350–4.500)

## 2015-02-14 LAB — COMPREHENSIVE METABOLIC PANEL (CC13)
ALT: 24 U/L (ref 0–55)
ANION GAP: 7 meq/L (ref 3–11)
AST: 24 U/L (ref 5–34)
Albumin: 3.6 g/dL (ref 3.5–5.0)
Alkaline Phosphatase: 89 U/L (ref 40–150)
BUN: 20.9 mg/dL (ref 7.0–26.0)
CHLORIDE: 107 meq/L (ref 98–109)
CO2: 27 meq/L (ref 22–29)
CREATININE: 1 mg/dL (ref 0.6–1.1)
Calcium: 9.6 mg/dL (ref 8.4–10.4)
EGFR: 72 mL/min/{1.73_m2} — ABNORMAL LOW (ref >90)
GLUCOSE: 97 mg/dL (ref 70–140)
Potassium: 3.9 mEq/L (ref 3.5–5.1)
SODIUM: 141 meq/L (ref 136–145)
Total Bilirubin: 0.39 mg/dL (ref 0.20–1.20)
Total Protein: 6.6 g/dL (ref 6.4–8.3)

## 2015-02-14 LAB — CBC WITH DIFFERENTIAL (CANCER CENTER ONLY)
BASO#: 0 10*3/uL (ref 0.0–0.2)
BASO%: 0.2 % (ref 0.0–2.0)
EOS%: 1.4 % (ref 0.0–7.0)
Eosinophils Absolute: 0.1 10*3/uL (ref 0.0–0.5)
HCT: 42.7 % (ref 34.8–46.6)
HGB: 14.3 g/dL (ref 11.6–15.9)
LYMPH#: 1.3 10*3/uL (ref 0.9–3.3)
LYMPH%: 24.1 % (ref 14.0–48.0)
MCH: 29.4 pg (ref 26.0–34.0)
MCHC: 33.5 g/dL (ref 32.0–36.0)
MCV: 88 fL (ref 81–101)
MONO#: 0.4 10*3/uL (ref 0.1–0.9)
MONO%: 7.7 % (ref 0.0–13.0)
NEUT#: 3.7 10*3/uL (ref 1.5–6.5)
NEUT%: 66.6 % (ref 39.6–80.0)
PLATELETS: 213 10*3/uL (ref 145–400)
RBC: 4.86 10*6/uL (ref 3.70–5.32)
RDW: 15.3 % (ref 11.1–15.7)
WBC: 5.6 10*3/uL (ref 3.9–10.0)

## 2015-02-14 LAB — LACTATE DEHYDROGENASE: LDH: 145 U/L (ref 94–250)

## 2015-02-14 MED ORDER — SODIUM CHLORIDE 0.9 % IJ SOLN
10.0000 mL | INTRAMUSCULAR | Status: DC | PRN
  Administered 2015-02-14: 10 mL via INTRAVENOUS
  Filled 2015-02-14: qty 10

## 2015-02-14 MED ORDER — HEPARIN SOD (PORK) LOCK FLUSH 100 UNIT/ML IV SOLN
500.0000 [IU] | Freq: Once | INTRAVENOUS | Status: AC
  Administered 2015-02-14: 500 [IU] via INTRAVENOUS
  Filled 2015-02-14: qty 5

## 2015-02-14 NOTE — Patient Instructions (Signed)

## 2015-02-15 ENCOUNTER — Telehealth: Payer: Self-pay | Admitting: *Deleted

## 2015-02-15 DIAGNOSIS — E559 Vitamin D deficiency, unspecified: Secondary | ICD-10-CM

## 2015-02-15 LAB — VITAMIN D 25 HYDROXY (VIT D DEFICIENCY, FRACTURES): Vit D, 25-Hydroxy: 27 ng/mL — ABNORMAL LOW (ref 30–100)

## 2015-02-15 MED ORDER — ERGOCALCIFEROL 1.25 MG (50000 UT) PO CAPS: 50000.0000 [IU] | ORAL_CAPSULE | ORAL | Status: DC

## 2015-02-15 NOTE — Progress Notes (Signed)
Hematology and Oncology Follow Up Visit  NAZLY DIGILIO [id number] 04-18-1961 54 y.o. 02/15/2015   Principle Diagnosis:  1. 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 2. BRCA1 positive. 3. Severe chemotherapy-induced neuropathy. 4. Intermittent iron-deficiency anemia.  Current Therapy:    Observation     Interim History:  Ms.  Fiorenza is back for followup. We last saw her back in June.  She's had a pretty good summer. She's had no specific issues. The main thing that is going on with her now are these episodes of perspiration. They happen during the daytime. She is somewhat embarrassed by them .  She is already menopausal I think. She has had her ovaries taken out. I don't think she sees a gynecologist.  She is holding steady with her weight. She is not exercising like she really should.  Of note, she did get a new kitten over the summer. She already has a dog. The 2 get along pretty well.  She is sleeping better. Again she is not having any issues with night sweats.  She's had no rashes.  She's had her last mammogram from what I can tell back in February 2015. She really needs to have an MRI. I will have to set this up.  She has been traveling. She has been going out to [place]. A son lives out there.  Overall, her performance status is ECOG 1.  Medications:  Current outpatient prescriptions:  .  aspirin 81 MG tablet, Take 81 mg by mouth daily. 2 TABS IN PM, Disp: , Rfl:  .  donepezil (ARICEPT) 10 MG tablet, Take 1 tablet (10 mg total) by mouth every morning., Disp: 90 tablet, Rfl: 2 .  DULoxetine (CYMBALTA) 60 MG capsule, TAKE ONE CAPSULE BY MOUTH EVERY DAY, Disp: 30 capsule, Rfl: 0 .  LYRICA 150 MG capsule, TAKE ONE CAPSULE BY MOUTH TWICE DAILY, Disp: 60 capsule, Rfl: 5 .  Multiple Vitamin (MULTIVITAMIN WITH MINERALS) TABS tablet, Take 1 tablet by mouth daily., Disp: , Rfl:  .  orphenadrine (NORFLEX) 100 MG tablet, TAKE 1 TABLET BY MOUTH TWICE DAILY  AS NEEDED FOR MUSCLE SPASMS, Disp: 60 tablet, Rfl: 0 .  potassium chloride (K-DUR) 10 MEQ tablet, TAKE 2 TABLETS BY MOUTH DAILY, Disp: 60 tablet, Rfl: 1 .  simvastatin (ZOCOR) 40 MG tablet, TAKE 1 TABLET BY MOUTH EVERY DAY, Disp: 30 tablet, Rfl: 11 .  triamterene-hydrochlorothiazide (MAXZIDE-25) 37.5-25 MG per tablet, TAKE 2 TABLETS BY MOUTH DAILY, Disp: 60 tablet, Rfl: 8 .  zolpidem (AMBIEN CR) 12.5 MG CR tablet, Take 1 tablet (12.5 mg total) by mouth at bedtime., Disp: 30 tablet, Rfl: 2 .  varenicline (CHANTIX STARTING MONTH PAK) 0.5 MG X 11 & 1 MG X 42 tablet, Take one 0.5 mg tablet by mouth once daily for 3 days, then increase to one 0.5 mg tablet twice daily for 4 days, then increase to one 1 mg tablet twice daily. (Patient not taking: Reported on 02/14/2015), Disp: 53 tablet, Rfl: 0  Allergies:  Allergies  Allergen Reactions  . Codeine Nausea Only    REACTION: Nausea  . Orange Itching and Rash    Past Medical History, Surgical history, Social history, and Family History were reviewed and updated.  Review of Systems: As above  Physical Exam:  height is 5' 7"  (1.702 m) and weight is 226 lb (102.513 kg). Her oral temperature is 97.4 F (36.3 C). Her blood pressure is 122/90 and her pulse is 85. Her respiration is 16.   Obese African-American female  in no obvious distress. Head and neck exam shows no ocular or oral lesions. Shows no pulsatile cervical or supraclavicular lymph nodes. Lungs are clear. Cardiac exam regular rate and rhythm with no murmurs rubs or bruits. Abdomen is soft with good bowel sounds. There is no fluid wave. There is no palpable liver or spleen tip. She has well-healed laparotomy scar. Back exam no tenderness over the spine ribs or hips. Extremities shows no clubbing cyanosis or edema. Neurological exam shows no focal neurological deficits. She does have some increased sensitivity to touch in her feet. Skin exam no rashes.  Lab Results  Component Value Date   WBC  5.6 02/14/2015   HGB 14.3 02/14/2015   HCT 42.7 02/14/2015   MCV 88 02/14/2015   PLT 213 02/14/2015     Chemistry      Component Value Date/Time   NA 141 02/14/2015 1053   NA 140 11/07/2014 1411   NA 135 10/12/2014 1123   K 3.9 02/14/2015 1053   K 3.6 11/07/2014 1411   K 4.1 10/12/2014 1123   CL 101 11/07/2014 1411   CL 100 10/12/2014 1123   CO2 27 02/14/2015 1053   CO2 29 11/07/2014 1411   CO2 30 10/12/2014 1123   BUN 20.9 02/14/2015 1053   BUN 19 11/07/2014 1411   BUN 20 10/12/2014 1123   CREATININE 1.0 02/14/2015 1053   CREATININE 1.2 11/07/2014 1411   CREATININE 1.04 10/12/2014 1123      Component Value Date/Time   CALCIUM 9.6 02/14/2015 1053   CALCIUM 10.1 11/07/2014 1411   CALCIUM 9.8 10/12/2014 1123   ALKPHOS 89 02/14/2015 1053   ALKPHOS 77 11/07/2014 1411   ALKPHOS 79 10/12/2014 1123   AST 24 02/14/2015 1053   AST 32 11/07/2014 1411   AST 27 10/12/2014 1123   ALT 24 02/14/2015 1053   ALT 29 11/07/2014 1411   ALT 25 10/12/2014 1123   BILITOT 0.39 02/14/2015 1053   BILITOT 0.70 11/07/2014 1411   BILITOT 0.5 10/12/2014 1123         Impression and Plan: Ms. Osorto is 54 year old African American female with a history of a poorly differentiated carcinoma of the abdomen. I did do molecular assay studies on this. It was felt to be ovarian cancer in origin.  She is BRCA1 positive. She is in remission. She had 6 cycles of chemotherapy with carboplatinum and Taxotere.. She's been in remission now for over 6 years.  She did develop neuropathy from the chemotherapy. She also probably has developed some memory issues.  I'm not too sure as to what to make of these sweats that she has. They're not night sweats.  I will check her thyroid. I will also check an estrogen level on her. She could certainly be menopausal. For right now, she really does not want to take anything for this.  I will make sure she gets an MRI for her breast since she is BRCA1 positive  We  will plan to see her back in another 4 months.   [name], MD 10/5/20167:30 AM

## 2015-02-15 NOTE — Telephone Encounter (Addendum)
Patient is aware of results.   ----- Message from [name], MD sent at 02/15/2015  7:09 AM EDT ----- Call - her vit D level is low.  Call in Vit D 50000 units a week.  Her thyroid is ok. She needs to have more labwork done for her estrogen level to be measured.  This can be done at the main cancer center.  pete

## 2015-02-16 ENCOUNTER — Other Ambulatory Visit (HOSPITAL_BASED_OUTPATIENT_CLINIC_OR_DEPARTMENT_OTHER): Payer: Medicare Other

## 2015-02-16 DIAGNOSIS — L74519 Primary focal hyperhidrosis, unspecified: Secondary | ICD-10-CM | POA: Diagnosis not present

## 2015-02-16 DIAGNOSIS — Z1502 Genetic susceptibility to malignant neoplasm of ovary: Secondary | ICD-10-CM | POA: Diagnosis not present

## 2015-02-16 DIAGNOSIS — Z1501 Genetic susceptibility to malignant neoplasm of breast: Secondary | ICD-10-CM | POA: Diagnosis not present

## 2015-02-16 DIAGNOSIS — Z1509 Genetic susceptibility to other malignant neoplasm: Secondary | ICD-10-CM

## 2015-02-16 DIAGNOSIS — N951 Menopausal and female climacteric states: Secondary | ICD-10-CM | POA: Diagnosis not present

## 2015-02-17 ENCOUNTER — Encounter: Payer: Self-pay | Admitting: Nurse Practitioner

## 2015-02-17 LAB — LUTEINIZING HORMONE: LH: 49.7 m[IU]/mL

## 2015-02-17 LAB — FOLLICLE STIMULATING HORMONE: FSH: 84.4 m[IU]/mL

## 2015-02-21 ENCOUNTER — Telehealth: Payer: Self-pay | Admitting: *Deleted

## 2015-02-21 LAB — ESTRADIOL, ULTRA SENS: Estradiol, Ultra Sensitive: 5 pg/mL

## 2015-02-21 NOTE — Telephone Encounter (Addendum)
Patient aware of results. She would like to know what her next step is with getting an answer for the frequent sweating she is having. Dr Marin Olp would like her to follow up with her PCP. She is aware.   ----- Message from [name], MD sent at 02/21/2015  1:21 PM EDT ----- Call -you are definitely menopausal!!!  [name]

## 2015-03-01 ENCOUNTER — Ambulatory Visit
Admission: RE | Admit: 2015-03-01 | Discharge: 2015-03-01 | Disposition: A | Discharge status: Home / Self Care | Payer: Medicare Other | Source: Ambulatory Visit | Attending: Hematology & Oncology | Admitting: Hematology & Oncology

## 2015-03-01 DIAGNOSIS — Z1509 Genetic susceptibility to other malignant neoplasm: Secondary | ICD-10-CM

## 2015-03-01 DIAGNOSIS — L74519 Primary focal hyperhidrosis, unspecified: Secondary | ICD-10-CM

## 2015-03-01 DIAGNOSIS — Z1502 Genetic susceptibility to malignant neoplasm of ovary: Secondary | ICD-10-CM

## 2015-03-01 DIAGNOSIS — N951 Menopausal and female climacteric states: Secondary | ICD-10-CM

## 2015-03-01 DIAGNOSIS — Z1239 Encounter for other screening for malignant neoplasm of breast: Secondary | ICD-10-CM | POA: Diagnosis not present

## 2015-03-01 DIAGNOSIS — Z1501 Genetic susceptibility to malignant neoplasm of breast: Secondary | ICD-10-CM

## 2015-03-01 MED ORDER — GADOBENATE DIMEGLUMINE 529 MG/ML IV SOLN
20.0000 mL | Freq: Once | INTRAVENOUS | Status: DC | PRN
Start: 2015-03-01 — End: 2015-03-02

## 2015-03-02 ENCOUNTER — Inpatient Hospital Stay: Admission: RE | Admit: 2015-03-02 | Payer: Self-pay | Source: Ambulatory Visit

## 2015-03-24 ENCOUNTER — Other Ambulatory Visit: Payer: Self-pay | Admitting: Hematology & Oncology

## 2015-03-24 ENCOUNTER — Other Ambulatory Visit: Payer: Self-pay | Admitting: Internal Medicine

## 2015-03-24 ENCOUNTER — Ambulatory Visit (INDEPENDENT_AMBULATORY_CARE_PROVIDER_SITE_OTHER): Payer: Managed Care, Other (non HMO) | Admitting: *Deleted

## 2015-03-24 DIAGNOSIS — Z23 Encounter for immunization: Secondary | ICD-10-CM | POA: Diagnosis not present

## 2015-03-24 NOTE — Telephone Encounter (Signed)
Ok to refill x 6 months

## 2015-03-24 NOTE — Telephone Encounter (Signed)
Sent to the pharmacy by e-scribe. 

## 2015-03-27 ENCOUNTER — Other Ambulatory Visit: Payer: Self-pay | Admitting: *Deleted

## 2015-03-27 MED ORDER — TRIAMTERENE-HCTZ 37.5-25 MG PO TABS: 2.0000 | ORAL_TABLET | Freq: Every day | ORAL | Status: DC

## 2015-03-28 ENCOUNTER — Ambulatory Visit (HOSPITAL_BASED_OUTPATIENT_CLINIC_OR_DEPARTMENT_OTHER): Payer: Managed Care, Other (non HMO)

## 2015-03-28 VITALS — BP 128/96 | HR 75 | Temp 98.1°F | Resp 18

## 2015-03-28 DIAGNOSIS — Z1502 Genetic susceptibility to malignant neoplasm of ovary: Secondary | ICD-10-CM

## 2015-03-28 DIAGNOSIS — Z452 Encounter for adjustment and management of vascular access device: Secondary | ICD-10-CM | POA: Diagnosis not present

## 2015-03-28 DIAGNOSIS — Z8543 Personal history of malignant neoplasm of ovary: Secondary | ICD-10-CM | POA: Diagnosis not present

## 2015-03-28 MED ORDER — SODIUM CHLORIDE 0.9 % IJ SOLN
10.0000 mL | INTRAMUSCULAR | Status: DC | PRN
  Administered 2015-03-28: 10 mL via INTRAVENOUS
  Filled 2015-03-28: qty 10

## 2015-03-28 MED ORDER — HEPARIN SOD (PORK) LOCK FLUSH 100 UNIT/ML IV SOLN
500.0000 [IU] | Freq: Once | INTRAVENOUS | Status: AC
  Administered 2015-03-28: 500 [IU] via INTRAVENOUS
  Filled 2015-03-28: qty 5

## 2015-03-28 NOTE — Progress Notes (Signed)
[name] presented for Portacath access and flush. Proper placement of portacath confirmed by CXR. Portacath located in the right chest wall accessed with  H 20 needle. Clean, Dry and Intact Good blood return present. Portacath flushed with 85m NS and 500U/571mHeparin per protocol and needle removed intact. Procedure without incident. Patient tolerated procedure well.

## 2015-03-28 NOTE — Patient Instructions (Signed)

## 2015-04-20 ENCOUNTER — Other Ambulatory Visit: Payer: Self-pay | Admitting: Hematology & Oncology

## 2015-05-31 ENCOUNTER — Encounter: Payer: Self-pay | Admitting: Neurology

## 2015-05-31 ENCOUNTER — Ambulatory Visit (INDEPENDENT_AMBULATORY_CARE_PROVIDER_SITE_OTHER): Payer: Medicare Other | Admitting: Neurology

## 2015-05-31 VITALS — BP 140/96 | HR 78 | Resp 20 | Ht 69.0 in | Wt 223.0 lb

## 2015-05-31 DIAGNOSIS — G4701 Insomnia due to medical condition: Secondary | ICD-10-CM | POA: Insufficient documentation

## 2015-05-31 DIAGNOSIS — G43109 Migraine with aura, not intractable, without status migrainosus: Secondary | ICD-10-CM

## 2015-05-31 DIAGNOSIS — H8110 Benign paroxysmal vertigo, unspecified ear: Secondary | ICD-10-CM | POA: Insufficient documentation

## 2015-05-31 DIAGNOSIS — H8113 Benign paroxysmal vertigo, bilateral: Secondary | ICD-10-CM | POA: Diagnosis not present

## 2015-05-31 DIAGNOSIS — H5319 Other subjective visual disturbances: Secondary | ICD-10-CM | POA: Diagnosis not present

## 2015-05-31 DIAGNOSIS — H53143 Visual discomfort, bilateral: Secondary | ICD-10-CM

## 2015-05-31 MED ORDER — ZOLPIDEM TARTRATE ER 12.5 MG PO TBCR: 12.5000 mg | EXTENDED_RELEASE_TABLET | Freq: Every day | ORAL | Status: DC

## 2015-05-31 MED ORDER — BUTALBITAL-APAP-CAFFEINE 50-325-40 MG PO TABS: 1.0000 | ORAL_TABLET | Freq: Four times a day (QID) | ORAL | Status: DC | PRN

## 2015-05-31 MED ORDER — DONEPEZIL HCL 10 MG PO TABS: 10.0000 mg | ORAL_TABLET | Freq: Every morning | ORAL | Status: DC

## 2015-05-31 NOTE — Patient Instructions (Signed)

## 2015-05-31 NOTE — Progress Notes (Signed)
[name]: [name] DOB: 1961/02/10  REASON FOR VISIT: follow up- neuropathy, cognitive changes, insomnia HISTORY FROM: [name]  HISTORY OF PRESENT ILLNESS: [name] is a 55 year old female with a history of neuropathy,cognitive changes and insomnia. [name] returns today for follow-up. [name] continues to take Ambien for insomnia. [name] states that if [name] does not take this medication [name] will not be able to go to sleep. [name] currently takes Lyrica 150 mg twice a day for neuropathy. [name] states that [name] burning and tingling is located in [name] toes extending to [name] mid foot bilaterally. [name] also has burning and tingling in [name] fingers that extends to [name] mid hand. Denies any changes with her gait or balance. That [name] had a fall while standing and watching fireworks. [name] states that [name] right leg went numb and cause her to fall. [name] states that this is [name] first time this has happened. [name] states that [name] numbness resolved. He states that [name] is not had any additional episodes of numbness. [name] feels that her memory has remained same. [name] states that [name] is able to do simple tasks but things that require more complex thinking [name] has difficulty with. For example [name] has trouble following plots in a TV show. [name] states that [name] has directions for how to make coffee posted. [name] is able to complete all ADLs independently. [name] operates a Teacher, music without difficulty. [name] states that occasionally [name]'ll take a wrong turn to a familiar place. [name] continues to take Aricept 10 mg daily. [name] states that [name] did have a sleep study area and [name] is unsure if [name] should be wearing oxygen at bedtime. [name] states that [name] had a dental procedure and [name] technician noted that her oxygen saturation dropped while [name] was sleeping. [name] denies any new neurological issues. [name] returns today for an evaluation.  HISTORY 05/18/14 (CD): [name] is here today for her yearly revisit  [name] is doing remarkably well knowing about her history of metastatic cancer with unknown primary to more [name] seems to have battled her disease very well.  [name] has lost some weight since her last visit, her blood pressure is well controlled. [name] HST study performed on 10-23-13 showed obstructive sleep apnea not to be present, but some hypoxemia at night there were 109 minutes of oxygenation at 88% or less. Since this test was a home sleep test there is a higher likelihood of artifact. [name] does 1-2 times a night feel air hungry. [name] does wake occasionally up with a feeling that [name] doesn't breath enough. And her heart will be racing- palpitations have also not been seen in her home sleep test. [name] described sudden falls still being a concern, neuropathy contributing to this.  her cognition is improved, by her own subjective assessment as well as MOCA test today : scored 26 out of 30 points. This is a normal range for her cognitive function. Ambien dependent , unable to go to sleep without sleep aid for several years now.  Her list of medication was reviewed and there have been no recent changes. Her dog is her main social contact , besides her church family.   REVIEW OF SYSTEMS: Out of a complete 14 system review of symptoms, [name] complains only of [name] following symptoms, and all other reviewed systems are negative.  Heat intolerance, excessive sweating, insomnia, daytime sleepiness, joint pain, memory loss, numbness  ALLERGIES: Allergies  Allergen Reactions  . Codeine Nausea Only  REACTION: Nausea  . Orange Itching and Rash    HOME MEDICATIONS: Outpatient Prescriptions Prior to Visit  Medication Sig Dispense Refill  . aspirin 81 MG tablet Take 81 mg by mouth daily. 2 TABS IN PM    . donepezil (ARICEPT) 10 MG tablet Take 1 tablet (10 mg total) by mouth every morning. 90 tablet 2  . DULoxetine (CYMBALTA) 60 MG capsule TAKE 1 CAPSULE BY MOUTH EVERY DAY 30 capsule 0    . ergocalciferol (VITAMIN D2) 50000 UNITS capsule Take 1 capsule (50,000 Units total) by mouth once a week. 8 capsule 3  . LYRICA 150 MG capsule TAKE ONE CAPSULE BY MOUTH TWICE DAILY 60 capsule 5  . Multiple Vitamin (MULTIVITAMIN WITH MINERALS) TABS tablet Take 1 tablet by mouth daily.    . orphenadrine (NORFLEX) 100 MG tablet TAKE 1 TABLET BY MOUTH TWICE DAILY AS NEEDED FOR MUSCLE SPASMS 60 tablet 5  . potassium chloride (K-DUR) 10 MEQ tablet TAKE 2 TABLETS BY MOUTH EVERY DAY 60 tablet 5  . simvastatin (ZOCOR) 40 MG tablet TAKE 1 TABLET BY MOUTH EVERY DAY 30 tablet 11  . triamterene-hydrochlorothiazide (MAXZIDE-25) 37.5-25 MG tablet Take 2 tablets by mouth daily. 60 tablet 5  . varenicline (CHANTIX STARTING MONTH PAK) 0.5 MG X 11 & 1 MG X 42 tablet Take one 0.5 mg tablet by mouth once daily for 3 days, then increase to one 0.5 mg tablet twice daily for 4 days, then increase to one 1 mg tablet twice daily. 53 tablet 0  . zolpidem (AMBIEN CR) 12.5 MG CR tablet Take 1 tablet (12.5 mg total) by mouth at bedtime. 30 tablet 2   No facility-administered medications prior to visit.    PAST MEDICAL HISTORY: Past Medical History  Diagnosis Date  . Hx pulmonary embolism   . Obesity   . Metrorrhagia   . Allergic rhinitis   . Iron deficiency anemia   . Hypertension   . Alopecia   . Vitamin D deficiency   . HLD (hyperlipidemia)   . Adjustment disorder   . Anxiety and depression   . Raynaud's syndrome   . Right groin mass   . Adenocarcinoma [facility])     unknown primary probaby ovarian 2009 chemo  . Squamous cell carcinoma of skin   . Ovarian cancer ([place]) 48  . Vision abnormalities   . Neuropathy ([place])   . 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    PAST SURGICAL HISTORY: Past Surgical History  Procedure Laterality Date  . Breast reduction surgery  2000  . Gastric bypass  1979  . Tonsillectomy    . Cesarean section      1991  . Abdominal hysterectomy   05/2008    TAH/BSO  . Colonoscopy  2004; 12/07/10    hemorrhoids    FAMILY HISTORY: Family History  Problem Relation Age of Onset  . Pulmonary embolism Mother   . Hypertension Father   . Breast cancer Maternal Grandmother     diagnosed in early 13s  . Hyperlipidemia      SOCIAL HISTORY: Social History   Social History  . Marital Status: Divorced    Spouse Name: N/A  . Number of Children: 1  . Years of Education: Master's   Occupational History  .      disabled   Social History Main Topics  . Smoking status: Current Every Day Smoker -- 1.00 packs/day for 37 years    Types: Cigarettes    Start date:  06/16/1978  . Smokeless tobacco: Never Used     Comment: 2- 7-16  STILL SMOKING  . Alcohol Use: 0.0 oz/week    0 Standard drinks or equivalent per week     Comment: rare, 3 weekly  . Drug Use: No  . Sexual Activity: Not on file   Other Topics Concern  . Not on file   Social History Narrative   Divorced   1 son /1 adopted adult son  2 dogs    Sales occupation on disability for now   Regular exercise-yes money stress   Daily caffeine use 2-3 and 2 cups of coffee   On since a security disability since 2012   [name] is right-handed.   [name] has a Master's degree.      PHYSICAL EXAM  Filed Vitals:   05/31/15 1554  BP: 140/96  Pulse: 78  Resp: 20  Height: 5' 9"  (1.753 m)  Weight: 223 lb (101.152 kg)   Body mass index is 32.92 kg/(m^2).  Generalized: Well developed, in no acute distress   Neurological examination  Mentation: Alert oriented to time, place, history taking. Follows all commands speech and language fluent. MOCA 26/30 Cranial nerve II-XII: Pupils were equal round reactive to light. Extraocular movements were full, visual field were full on confrontational test. Facial sensation and strength were normal. Uvula tongue midline. Head turning and shoulder shrug  were normal and symmetric. Motor: [name] motor testing reveals 5 over 5 strength of all 4  extremities. Good symmetric motor tone is noted throughout.  Sensory: numbness in feet and calf.  Coordination: Cerebellar testing reveals good finger-nose-finger and heel-to-shin bilaterally.  Gait and station: Gait is normal. Tandem gait is normal. Romberg is negative. No drift is seen.  Reflexes: Deep tendon reflexes are symmetric and normal bilaterally.   DIAGNOSTIC DATA (LABS, IMAGING, TESTING) - I reviewed [name] records, labs, notes, testing and imaging myself where available.  Lab Results  Component Value Date   WBC 5.6 02/14/2015   HGB 14.3 02/14/2015   HCT 42.7 02/14/2015   MCV 88 02/14/2015   PLT 213 02/14/2015      Component Value Date/Time   NA 141 02/14/2015 1053   NA 140 11/07/2014 1411   NA 135 10/12/2014 1123   K 3.9 02/14/2015 1053   K 3.6 11/07/2014 1411   K 4.1 10/12/2014 1123   CL 101 11/07/2014 1411   CL 100 10/12/2014 1123   CO2 27 02/14/2015 1053   CO2 29 11/07/2014 1411   CO2 30 10/12/2014 1123   GLUCOSE 97 02/14/2015 1053   GLUCOSE 85 11/07/2014 1411   GLUCOSE 85 10/12/2014 1123   BUN 20.9 02/14/2015 1053   BUN 19 11/07/2014 1411   BUN 20 10/12/2014 1123   CREATININE 1.0 02/14/2015 1053   CREATININE 1.2 11/07/2014 1411   CREATININE 1.04 10/12/2014 1123   CALCIUM 9.6 02/14/2015 1053   CALCIUM 10.1 11/07/2014 1411   CALCIUM 9.8 10/12/2014 1123   PROT 6.6 02/14/2015 1053   PROT 7.1 11/07/2014 1411   PROT 7.1 10/12/2014 1123   ALBUMIN 3.6 02/14/2015 1053   ALBUMIN 3.9 11/07/2014 1411   ALBUMIN 4.1 10/12/2014 1123   AST 24 02/14/2015 1053   AST 32 11/07/2014 1411   AST 27 10/12/2014 1123   ALT 24 02/14/2015 1053   ALT 29 11/07/2014 1411   ALT 25 10/12/2014 1123   ALKPHOS 89 02/14/2015 1053   ALKPHOS 77 11/07/2014 1411   ALKPHOS 79 10/12/2014 1123   BILITOT 0.39  02/14/2015 1053   BILITOT 0.70 11/07/2014 1411   BILITOT 0.5 10/12/2014 1123   GFRNONAA 75.77 09/04/2009 0943   GFRAA  07/06/2009 1722    >60        [name] eGFR has been  calculated using [name] MDRD equation. This calculation has not been validated in all clinical situations. eGFR's persistently <60 mL/min signify possible Chronic Kidney Disease.   Lab Results  Component Value Date   CHOL 155 10/12/2014   HDL 46.80 10/12/2014   LDLCALC 70 10/12/2014   LDLDIRECT 98.7 09/04/2009   TRIG 192.0* 10/12/2014   CHOLHDL 3 10/12/2014    ASSESSMENT AND PLAN 55 y.o. year old female  has a past medical history of pulmonary embolism; Obesity; Metrorrhagia; Allergic rhinitis; Iron deficiency anemia; Hypertension; Alopecia; Vitamin D deficiency; HLD (hyperlipidemia); Adjustment disorder; Anxiety and depression; Raynaud's syndrome; Right groin mass; Adenocarcinoma ([place]); Squamous cell carcinoma of skin; Ovarian cancer ([place]) (48); Vision abnormalities; Neuropathy ([place]); BRCA1 positive (01/27/2013); and Ovarian cancer genetic susceptibility (01/27/2013). here with:  1. 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  2. Cognitive changes in [name] with ovarian cancer and long term chemotherapy - MOCA 22-30, should not drive at night. Needs to have home alarm system.  3. Neuropathy, chemotherapy induced. To [name] level of syncopal events.  4. headaches , those have been rare after hysterectomy, but now returned , migrainous character - but sleep study did not show hypoxemia. Sometimes [name] will wake up with a headache, sometimes [name] headache will wake her -  episodic cluster headache. Due to her cognitive problems I would not recommend to use topiramate and with her history of ovarian cancer I am afraid of Bertin her liver metabolism. Her last comprehensive metabolic panel however was excellent and I would consider using a migraine prevention with a beta blocker.[name] is reluctant due to her history of presyncopal events.   Midodrine?    [name] will continue taking Ambien for insomnia.  I will refill this today for 90 days and 1 refill. . [name]'s memory has declined, [name] scored  22-30  Her MOCA was recently 26/30.  We will continue to monitor her memory. [name] should continue Aricept 10 mg daily.  [name]'s neuropathy has remained stable. [name] continues on Lyrica 150 mg twice a day. [name] did have a recent fall due to numbness in [name] right leg. [name] states that this numbness has not reoccurred. We will continue to monitor this.   [name] is questioning whether [name] should use oxygen at night after a sleep study.  I will consult with Dr. Brett Fairy for her recommendations and my assistant will call [name]. [name] will follow-up in 6 months or sooner if needed.    Larey Seat, MD  05/31/2015, 4:16 PM [facility] [address], [place], [place] [address] [phone]  Note: This document was prepared with digital dictation and possible smart phrase technology. Any transcriptional errors that result from this process are unintentional.

## 2015-06-01 ENCOUNTER — Other Ambulatory Visit: Payer: Self-pay | Admitting: Hematology & Oncology

## 2015-06-19 ENCOUNTER — Encounter: Payer: Self-pay | Admitting: Hematology & Oncology

## 2015-06-19 ENCOUNTER — Ambulatory Visit: Payer: Managed Care, Other (non HMO)

## 2015-06-19 ENCOUNTER — Other Ambulatory Visit (HOSPITAL_BASED_OUTPATIENT_CLINIC_OR_DEPARTMENT_OTHER): Payer: Medicare Other

## 2015-06-19 ENCOUNTER — Ambulatory Visit (HOSPITAL_BASED_OUTPATIENT_CLINIC_OR_DEPARTMENT_OTHER): Payer: Medicare Other | Admitting: Hematology & Oncology

## 2015-06-19 VITALS — BP 134/94 | HR 87 | Temp 99.0°F | Resp 16 | Ht 69.0 in | Wt 226.0 lb

## 2015-06-19 DIAGNOSIS — G629 Polyneuropathy, unspecified: Secondary | ICD-10-CM

## 2015-06-19 DIAGNOSIS — Z1502 Genetic susceptibility to malignant neoplasm of ovary: Secondary | ICD-10-CM

## 2015-06-19 DIAGNOSIS — N951 Menopausal and female climacteric states: Secondary | ICD-10-CM

## 2015-06-19 DIAGNOSIS — L74519 Primary focal hyperhidrosis, unspecified: Secondary | ICD-10-CM

## 2015-06-19 DIAGNOSIS — C561 Malignant neoplasm of right ovary: Secondary | ICD-10-CM

## 2015-06-19 DIAGNOSIS — R519 Headache, unspecified: Secondary | ICD-10-CM

## 2015-06-19 DIAGNOSIS — R748 Abnormal levels of other serum enzymes: Secondary | ICD-10-CM

## 2015-06-19 DIAGNOSIS — R51 Headache: Secondary | ICD-10-CM

## 2015-06-19 DIAGNOSIS — Z8589 Personal history of malignant neoplasm of other organs and systems: Secondary | ICD-10-CM

## 2015-06-19 DIAGNOSIS — Z1501 Genetic susceptibility to malignant neoplasm of breast: Secondary | ICD-10-CM

## 2015-06-19 DIAGNOSIS — Z1509 Genetic susceptibility to other malignant neoplasm: Secondary | ICD-10-CM

## 2015-06-19 LAB — CBC WITH DIFFERENTIAL (CANCER CENTER ONLY)
BASO#: 0 10*3/uL (ref 0.0–0.2)
BASO%: 0.1 % (ref 0.0–2.0)
EOS%: 1.5 % (ref 0.0–7.0)
Eosinophils Absolute: 0.1 10*3/uL (ref 0.0–0.5)
HCT: 41.9 % (ref 34.8–46.6)
HGB: 14.1 g/dL (ref 11.6–15.9)
LYMPH#: 1.6 10*3/uL (ref 0.9–3.3)
LYMPH%: 23.5 % (ref 14.0–48.0)
MCH: 29.3 pg (ref 26.0–34.0)
MCHC: 33.7 g/dL (ref 32.0–36.0)
MCV: 87 fL (ref 81–101)
MONO#: 0.5 10*3/uL (ref 0.1–0.9)
MONO%: 7.2 % (ref 0.0–13.0)
NEUT#: 4.5 10*3/uL (ref 1.5–6.5)
NEUT%: 67.7 % (ref 39.6–80.0)
PLATELETS: 245 10*3/uL (ref 145–400)
RBC: 4.81 10*6/uL (ref 3.70–5.32)
RDW: 14.9 % (ref 11.1–15.7)
WBC: 6.7 10*3/uL (ref 3.9–10.0)

## 2015-06-19 LAB — CMP (CANCER CENTER ONLY)
ALT(SGPT): 26 U/L (ref 10–47)
AST: 26 U/L (ref 11–38)
Albumin: 3.6 g/dL (ref 3.3–5.5)
Alkaline Phosphatase: 104 U/L — ABNORMAL HIGH (ref 26–84)
BUN: 24 mg/dL — AB (ref 7–22)
CHLORIDE: 104 meq/L (ref 98–108)
CO2: 27 meq/L (ref 18–33)
Calcium: 9.9 mg/dL (ref 8.0–10.3)
Creat: 1 mg/dl (ref 0.6–1.2)
GLUCOSE: 104 mg/dL (ref 73–118)
POTASSIUM: 3.5 meq/L (ref 3.3–4.7)
SODIUM: 140 meq/L (ref 128–145)
Total Bilirubin: 0.5 mg/dl (ref 0.20–1.60)
Total Protein: 7.4 g/dL (ref 6.4–8.1)

## 2015-06-19 LAB — LACTATE DEHYDROGENASE: LDH: 176 U/L (ref 125–245)

## 2015-06-19 MED ORDER — HEPARIN SOD (PORK) LOCK FLUSH 100 UNIT/ML IV SOLN
500.0000 [IU] | Freq: Once | INTRAVENOUS | Status: AC
  Administered 2015-06-19: 500 [IU] via INTRAVENOUS
  Filled 2015-06-19: qty 5

## 2015-06-19 MED ORDER — SODIUM CHLORIDE 0.9% FLUSH
10.0000 mL | INTRAVENOUS | Status: DC | PRN
  Administered 2015-06-19: 10 mL via INTRAVENOUS
  Filled 2015-06-19: qty 10

## 2015-06-19 NOTE — Patient Instructions (Signed)

## 2015-06-19 NOTE — Progress Notes (Signed)
Hematology and Oncology Follow Up Visit  [name] [id number] 07-17-1960 55 y.o. 06/19/2015   Principle Diagnosis:  1. 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 2. BRCA1 positive. 3. Severe chemotherapy-induced neuropathy. 4. Intermittent iron-deficiency anemia.  Current Therapy:    Observation     Interim History:  Ms.  [name] is back for followup. We last saw her back in October. Since then, she's been to [place]. She was there for 3 weeks. She also has been in [facility]. She plans to go back to [facility]. Her son lives out there area. She's been having issues with headache. These up in fairly severe. That occasionally woken her up at nighttime.  I think we have to check an MRI of the brain see what is going on.  She's had her last mammogram from what I can tell back in February 2015. She really needs to have an MRI. I will have to set this up.  She also has been having some abdominal discomfort. We will see what and ultrasound of her abdomen shows.  Her last mammogram that I have on her was back in March 2016. She will make sure she has another one set up.  She still has her Port-A-Cath in. We had to make sure that we flush this.  Her neuropathy seems to come and go. Seems to be managing this fairly well.  Overall, her performance status is ECOG 1.  Medications:  Current outpatient prescriptions:  .  aspirin 81 MG tablet, Take 81 mg by mouth daily. 2 TABS IN PM, Disp: , Rfl:  .  butalbital-acetaminophen-caffeine (FIORICET, ESGIC) 50-325-40 MG tablet, Take 1 tablet by mouth every 6 (six) hours as needed for headache., Disp: 10 tablet, Rfl: 3 .  donepezil (ARICEPT) 10 MG tablet, Take 1 tablet (10 mg total) by mouth every morning., Disp: 90 tablet, Rfl: 2 .  DULoxetine (CYMBALTA) 60 MG capsule, TAKE ONE CAPSULE BY MOUTH DAILY, Disp: 30 capsule, Rfl: 0 .  ergocalciferol (VITAMIN D2) 50000 UNITS capsule, Take 1 capsule (50,000 Units total) by mouth once a  week., Disp: 8 capsule, Rfl: 3 .  LYRICA 150 MG capsule, TAKE ONE CAPSULE BY MOUTH TWICE DAILY, Disp: 60 capsule, Rfl: 5 .  Multiple Vitamin (MULTIVITAMIN WITH MINERALS) TABS tablet, Take 1 tablet by mouth daily., Disp: , Rfl:  .  orphenadrine (NORFLEX) 100 MG tablet, TAKE 1 TABLET BY MOUTH TWICE DAILY AS NEEDED FOR MUSCLE SPASMS, Disp: 60 tablet, Rfl: 5 .  potassium chloride (K-DUR) 10 MEQ tablet, TAKE 2 TABLETS BY MOUTH EVERY DAY, Disp: 60 tablet, Rfl: 5 .  simvastatin (ZOCOR) 40 MG tablet, TAKE 1 TABLET BY MOUTH EVERY DAY, Disp: 30 tablet, Rfl: 11 .  triamterene-hydrochlorothiazide (MAXZIDE-25) 37.5-25 MG tablet, Take 2 tablets by mouth daily., Disp: 60 tablet, Rfl: 5 .  varenicline (CHANTIX STARTING MONTH PAK) 0.5 MG X 11 & 1 MG X 42 tablet, Take one 0.5 mg tablet by mouth once daily for 3 days, then increase to one 0.5 mg tablet twice daily for 4 days, then increase to one 1 mg tablet twice daily., Disp: 53 tablet, Rfl: 0 .  zolpidem (AMBIEN CR) 12.5 MG CR tablet, Take 1 tablet (12.5 mg total) by mouth at bedtime., Disp: 90 tablet, Rfl: 1  Allergies:  Allergies  Allergen Reactions  . Codeine Nausea Only    REACTION: Nausea  . Orange Itching and Rash    Past Medical History, Surgical history, Social history, and Family History were reviewed and updated.  Review of Systems: As above  Physical Exam:  height is 5' 9"  (1.753 m) and weight is 226 lb (102.513 kg). Her temperature is 99 F (37.2 C). Her blood pressure is 134/94 and her pulse is 87. Her respiration is 16.   Obese African-American female in no obvious distress. Head and neck exam shows no ocular or oral lesions. Shows no pulsatile cervical or supraclavicular lymph nodes. Lungs are clear. Cardiac exam regular rate and rhythm with no murmurs rubs or bruits. Abdomen is soft with good bowel sounds. There is no fluid wave. There is no palpable liver or spleen tip. She has well-healed laparotomy scar. Back exam no tenderness over the  spine ribs or hips. Extremities shows no clubbing cyanosis or edema. Neurological exam shows no focal neurological deficits. She does have some increased sensitivity to touch in her feet. Skin exam no rashes.  Lab Results  Component Value Date   WBC 6.7 06/19/2015   HGB 14.1 06/19/2015   HCT 41.9 06/19/2015   MCV 87 06/19/2015   PLT 245 06/19/2015     Chemistry      Component Value Date/Time   NA 140 06/19/2015 1207   NA 141 02/14/2015 1053   NA 135 10/12/2014 1123   K 3.5 06/19/2015 1207   K 3.9 02/14/2015 1053   K 4.1 10/12/2014 1123   CL 104 06/19/2015 1207   CL 100 10/12/2014 1123   CO2 27 06/19/2015 1207   CO2 27 02/14/2015 1053   CO2 30 10/12/2014 1123   BUN 24* 06/19/2015 1207   BUN 20.9 02/14/2015 1053   BUN 20 10/12/2014 1123   CREATININE 1.0 06/19/2015 1207   CREATININE 1.0 02/14/2015 1053   CREATININE 1.04 10/12/2014 1123      Component Value Date/Time   CALCIUM 9.9 06/19/2015 1207   CALCIUM 9.6 02/14/2015 1053   CALCIUM 9.8 10/12/2014 1123   ALKPHOS 104* 06/19/2015 1207   ALKPHOS 89 02/14/2015 1053   ALKPHOS 79 10/12/2014 1123   AST 26 06/19/2015 1207   AST 24 02/14/2015 1053   AST 27 10/12/2014 1123   ALT 26 06/19/2015 1207   ALT 24 02/14/2015 1053   ALT 25 10/12/2014 1123   BILITOT 0.50 06/19/2015 1207   BILITOT 0.39 02/14/2015 1053   BILITOT 0.5 10/12/2014 1123         Impression and Plan: [name] is 55 year old African American female with a history of a poorly differentiated carcinoma of the abdomen. I did do molecular assay studies on this. It was felt to be ovarian cancer in origin.  She is BRCA1 positive. She is in remission. She had 6 cycles of chemotherapy with carboplatinum and Taxotere.. She's been in remission now for over 6 years.  She did develop neuropathy from the chemotherapy. She also probably has developed some memory issues.  I will go ahead and set her up with MRI of the brain. I need to make sure that nothing is going  on up there. She is at risk for other malignancies because of her BRCA positivity.  I also want to get an ultrasound of her abdomen.  She does have a Port-A-Cath in. I'll have to make sure that we flushed the Port-A-Cath  . [name], MD 2/6/20176:02 PM

## 2015-06-20 ENCOUNTER — Telehealth: Payer: Self-pay | Admitting: Hematology & Oncology

## 2015-06-20 ENCOUNTER — Other Ambulatory Visit: Payer: Self-pay | Admitting: Adult Health

## 2015-06-20 LAB — IRON AND TIBC
%SAT: 14 % — ABNORMAL LOW (ref 21–57)
Iron: 43 ug/dL (ref 41–142)
TIBC: 319 ug/dL (ref 236–444)
UIBC: 276 ug/dL (ref 120–384)

## 2015-06-20 LAB — FERRITIN: Ferritin: 44 ng/ml (ref 9–269)

## 2015-06-20 NOTE — Telephone Encounter (Signed)
As part of our Prior Authorization Reduction program, this [employer] Medicare Advantage members plan does not currently require a prior authorization to receive these services in your state.  If the member receives this care outside of your state*, prior authorization may still be required before the services are delivered. You can request a prior authorization by calling the [employer] Clinical Request Line at [phone], option 4. The care provider delivering the services in the other state can also request a prior authorization online or over the phone.   If you have general questions about the prior authorization program, please call us at [phone], or visit:   [email or web address] > Clinician Resources > Cardiology > Cardiology Notification/Prior Authorization   [email or web address] > Clinician Resources > Radiology > Radiology Notification/Prior Authorization   [email or web address] > Clinician Resources > Oncology > Medicare Advantage Therapeutic Radiation.   Thank you.   *The Prior Authorization Reduction program currently applies to care providers in [place], [place], [place], [place], [place], [place], [place] and [place] delivering care to [employer] members. Services delivered outside of those states may require prior authorization.  Please print and retain a copy of this confirmation in the patient's medical record. Physician Name: Dr. Burney Gauze Contact: Baxter Flattery Physician Address: [address] [place], [place] [id number] Phone Number: [phone] Fax Number: [phone]   Patient Name: [name] Patient Id: [id number] Insurance Carrier: U[employer]-MEDICARE   Primary Diagnosis Code: C56.1 Description: Malignant neoplasm of right ovary Secondary Diagnosis Code: Description: CPT Code 78412 Description: MRI Brain W/ & W/O CONTRAST Case Number: [id number] Review Date: 06/20/2015 8:26:32 AM

## 2015-06-21 ENCOUNTER — Telehealth: Payer: Self-pay | Admitting: *Deleted

## 2015-06-21 ENCOUNTER — Ambulatory Visit (HOSPITAL_COMMUNITY)
Admission: RE | Admit: 2015-06-21 | Discharge: 2015-06-21 | Disposition: A | Discharge status: Home / Self Care | Payer: Medicare Other | Source: Ambulatory Visit | Attending: Hematology & Oncology | Admitting: Hematology & Oncology

## 2015-06-21 ENCOUNTER — Ambulatory Visit (HOSPITAL_COMMUNITY): Admission: RE | Admit: 2015-06-21 | Payer: Medicare Other | Source: Ambulatory Visit

## 2015-06-21 ENCOUNTER — Other Ambulatory Visit: Payer: Self-pay | Admitting: *Deleted

## 2015-06-21 ENCOUNTER — Ambulatory Visit (HOSPITAL_COMMUNITY): Payer: Medicare Other

## 2015-06-21 DIAGNOSIS — R51 Headache: Secondary | ICD-10-CM | POA: Diagnosis not present

## 2015-06-21 DIAGNOSIS — R748 Abnormal levels of other serum enzymes: Secondary | ICD-10-CM | POA: Insufficient documentation

## 2015-06-21 DIAGNOSIS — Z86718 Personal history of other venous thrombosis and embolism: Secondary | ICD-10-CM

## 2015-06-21 DIAGNOSIS — R945 Abnormal results of liver function studies: Secondary | ICD-10-CM | POA: Diagnosis not present

## 2015-06-21 DIAGNOSIS — K7689 Other specified diseases of liver: Secondary | ICD-10-CM | POA: Insufficient documentation

## 2015-06-21 DIAGNOSIS — C561 Malignant neoplasm of right ovary: Secondary | ICD-10-CM | POA: Insufficient documentation

## 2015-06-21 DIAGNOSIS — G43909 Migraine, unspecified, not intractable, without status migrainosus: Secondary | ICD-10-CM | POA: Diagnosis not present

## 2015-06-21 DIAGNOSIS — K449 Diaphragmatic hernia without obstruction or gangrene: Secondary | ICD-10-CM | POA: Insufficient documentation

## 2015-06-21 DIAGNOSIS — R519 Headache, unspecified: Secondary | ICD-10-CM

## 2015-06-21 DIAGNOSIS — R079 Chest pain, unspecified: Secondary | ICD-10-CM | POA: Insufficient documentation

## 2015-06-21 MED ORDER — IOHEXOL 350 MG/ML SOLN
100.0000 mL | Freq: Once | INTRAVENOUS | Status: AC | PRN
  Administered 2015-06-21: 100 mL via INTRAVENOUS

## 2015-06-21 MED ORDER — GADOBENATE DIMEGLUMINE 529 MG/ML IV SOLN
20.0000 mL | Freq: Once | INTRAVENOUS | Status: AC | PRN
  Administered 2015-06-21: 20 mL via INTRAVENOUS

## 2015-06-21 NOTE — Telephone Encounter (Signed)
Patient left message on office voice mail stating that she has been experiencing pain under her breastbone when she sits or stands. This started after her port was flushed on Monday, February 6th.  She states that today she feels like the pain might be extending into her arm. She has had a PE in the past and states that this feels like she did when she had the PE. She is already on her way to [facility] for an [place] and she wants something else ordered to assess her issues while there.  While speaking to Dr Marin Olp, [facility] in radiology from [facility] called stating she had the patient for [place] and was notifying office of the same patient concerns. Dr Marin Olp wants patient to have a CT angio chest while she's there at the hospital. [facility] stated CT would be able to get patient scanned today.  Dr Marin Olp aware.

## 2015-06-22 ENCOUNTER — Encounter: Payer: Self-pay | Admitting: *Deleted

## 2015-06-26 ENCOUNTER — Telehealth: Payer: Self-pay | Admitting: Hematology & Oncology

## 2015-06-26 NOTE — Telephone Encounter (Signed)
As part of our Prior Authorization Reduction program, this [employer] Medicare Advantage members plan does not currently require a prior authorization to receive these services in your state.  If the member receives this care outside of your state*, prior authorization may still be required before the services are delivered. You can request a prior authorization by calling the [employer] Clinical Request Line at [phone], option 4. The care provider delivering the services in the other state can also request a prior authorization online or over the phone.   If you have general questions about the prior authorization program, please call us at [phone], or visit:   [email or web address] > Clinician Resources > Cardiology > Cardiology Notification/Prior Authorization   [email or web address] > Clinician Resources > Radiology > Radiology Notification/Prior Authorization   [email or web address] > Clinician Resources > Oncology > Medicare Advantage Therapeutic Radiation.   Thank you.   *The Prior Authorization Reduction program currently applies to care providers in [place], [place], [place], [place], [place], [place], [place] and [place] delivering care to [employer] members. Services delivered outside of those states may require prior authorization.  Please print and retain a copy of this confirmation in the patient's medical record. Physician Name: Dr. Burney Gauze Contact: Baxter Flattery Physician Address: [address] [place], [place] [id number] Phone Number: [phone] Fax Number: [phone]   Patient Name: [name] Patient Id: [id number] Insurance Carrier: UHC-MEDICARE   Primary Diagnosis Code: R51 Description: Headache Secondary Diagnosis Code: Description: CPT Code 93716 Description: MRI Brain W/ & W/O CONTRAST Case Number: [id number] Review Date: 06/26/2015 11:37:34 AM Expiration Date: N/A     [place] [employer] Plan

## 2015-07-05 ENCOUNTER — Ambulatory Visit (HOSPITAL_COMMUNITY): Payer: Managed Care, Other (non HMO)

## 2015-07-05 ENCOUNTER — Other Ambulatory Visit (HOSPITAL_COMMUNITY): Payer: Medicare Other

## 2015-07-15 ENCOUNTER — Other Ambulatory Visit: Payer: Self-pay | Admitting: Hematology & Oncology

## 2015-07-24 ENCOUNTER — Ambulatory Visit (HOSPITAL_BASED_OUTPATIENT_CLINIC_OR_DEPARTMENT_OTHER): Payer: Medicare Other

## 2015-07-24 VITALS — BP 132/92 | HR 79 | Temp 98.2°F | Resp 18

## 2015-07-24 DIAGNOSIS — C569 Malignant neoplasm of unspecified ovary: Secondary | ICD-10-CM

## 2015-07-24 DIAGNOSIS — Z452 Encounter for adjustment and management of vascular access device: Secondary | ICD-10-CM

## 2015-07-24 DIAGNOSIS — Z8589 Personal history of malignant neoplasm of other organs and systems: Secondary | ICD-10-CM

## 2015-07-24 MED ORDER — HEPARIN SOD (PORK) LOCK FLUSH 100 UNIT/ML IV SOLN
500.0000 [IU] | Freq: Once | INTRAVENOUS | Status: AC
  Administered 2015-07-24: 500 [IU] via INTRAVENOUS
  Filled 2015-07-24: qty 5

## 2015-07-24 MED ORDER — SODIUM CHLORIDE 0.9% FLUSH
10.0000 mL | INTRAVENOUS | Status: DC | PRN
  Administered 2015-07-24: 10 mL via INTRAVENOUS
  Filled 2015-07-24: qty 10

## 2015-07-24 NOTE — Patient Instructions (Signed)
Implanted Port Insertion An implanted port is a central line that has a round shape and is placed under the skin. It is used as a long-term IV access for:   Medicines, such as chemotherapy.   Fluids.   Liquid nutrition, such as total parenteral nutrition (TPN).   Blood samples.  LET [facility] CARE PROVIDER KNOW ABOUT:  Allergies to food or medicine.   Medicines taken, including vitamins, herbs, eye drops, creams, and over-the-counter medicines.   Any allergies to heparin.  Use of steroids (by mouth or creams).   Previous problems with anesthetics or numbing medicines.   History of bleeding problems or blood clots.   Previous surgery.   Other health problems, including diabetes and kidney problems.   Possibility of pregnancy, if this applies. RISKS AND COMPLICATIONS Generally, this is a safe procedure. However, as with any procedure, problems can occur. Possible problems include:  Damage to the blood vessel, bruising, or bleeding at the puncture site.   Infection.  Blood clot in the vessel that the port is in.  Breakdown of the skin over your port.  Very rarely a person may develop a condition called a pneumothorax, a collection of air in the chest that may cause one of the lungs to collapse. The placement of these catheters with the appropriate imaging guidance significantly decreases the risk of a pneumothorax.  BEFORE THE PROCEDURE   Your health care provider may want you to have blood tests. These tests can help tell how well your kidneys and liver are working. They can also show how well your blood clots.   If you take blood thinners (anticoagulant medicines), ask your health care provider when you should stop taking them.   Make arrangements for someone to drive you home. This is necessary if you have been sedated for your procedure.  PROCEDURE  Port insertion usually takes about 30-45 minutes.   An IV needle will be inserted in your arm.  Medicine for pain and medicine to help relax you (sedative) will flow directly into your body through this needle.   You will lie on an exam table, and you will be connected to monitors to keep track of your heart rate, blood pressure, and breathing throughout the procedure.  An oxygen monitoring device may be attached to your finger. Oxygen will be given.   Everything will be kept as germ free (sterile) as possible during the procedure. The skin near the point of the incision will be cleansed with antiseptic, and the area will be draped with sterile towels. The skin and deeper tissues over the port area will be made numb with a local anesthetic.  Two small cuts (incisions) will be made in the skin to insert the port. One will be made in the neck to obtain access to the vein where the catheter will lie.   Because the port reservoir will be placed under the skin, a small skin incision will be made in the upper chest, and a small pocket for the port will be made under the skin. The catheter that will be connected to the port tunnels to a large central vein in the chest. A small, raised area will remain on your body at the site of the reservoir when the procedure is complete.  The port placement will be done under imaging guidance to ensure the proper placement.  The reservoir has a silicone covering that can be punctured with a special needle.   The port will be flushed with normal  saline, and blood will be drawn to make sure it is working properly.  There will be nothing remaining outside the skin when the procedure is finished.   Incisions will be held together by stitches, surgical glue, or a special tape. AFTER THE PROCEDURE  You will stay in a recovery area until the anesthesia has worn off. Your blood pressure and pulse will be checked.  A final chest X-ray will be taken to check the placement of the port and to ensure that there is no injury to your lung.   This information is  not intended to replace advice given to you by your health care provider. Make sure you discuss any questions you have with your health care provider.   Document Released: 02/17/2013 Document Revised: 05/20/2014 Document Reviewed: 02/17/2013 Elsevier Interactive Patient Education [employer].

## 2015-08-16 ENCOUNTER — Other Ambulatory Visit: Payer: Self-pay | Admitting: Internal Medicine

## 2015-08-17 ENCOUNTER — Other Ambulatory Visit: Payer: Self-pay | Admitting: Family Medicine

## 2015-08-17 ENCOUNTER — Telehealth: Payer: Self-pay | Admitting: Family Medicine

## 2015-08-17 DIAGNOSIS — E559 Vitamin D deficiency, unspecified: Secondary | ICD-10-CM

## 2015-08-17 DIAGNOSIS — Z0000 Encounter for general adult medical examination without abnormal findings: Secondary | ICD-10-CM

## 2015-08-17 NOTE — Telephone Encounter (Signed)
Pt has been sch

## 2015-08-17 NOTE — Telephone Encounter (Signed)
lmom for pt to call back

## 2015-08-17 NOTE — Telephone Encounter (Signed)
Pt called back and said she is currently in [place] and needed a refill.  Out of her medication.  Called in a 1 month supply

## 2015-08-17 NOTE — Telephone Encounter (Signed)
Pt is due for CPX and lab work in June 2017.  I have placed the lab orders.  Please contact the pt and help her make both appointments.  Thanks!

## 2015-08-17 NOTE — Telephone Encounter (Signed)
Left a message on home and cell for the pt to return my call.  [place]  Need to verify that she wants this refill.  Last filled at a local pharmacy.

## 2015-09-11 ENCOUNTER — Other Ambulatory Visit: Payer: Self-pay

## 2015-09-11 ENCOUNTER — Telehealth: Payer: Self-pay | Admitting: *Deleted

## 2015-09-11 DIAGNOSIS — Z1231 Encounter for screening mammogram for malignant neoplasm of breast: Secondary | ICD-10-CM

## 2015-09-11 NOTE — Telephone Encounter (Signed)
Per the [facility] office I have scheduled patient for flush appts. I have made appts and [facility] office aware, they will contact the patient.

## 2015-09-18 ENCOUNTER — Other Ambulatory Visit: Payer: Self-pay | Admitting: Internal Medicine

## 2015-09-18 NOTE — Telephone Encounter (Signed)
Called to the pharmacy and left on machine.  Pt has upcoming appt on 10/25/15

## 2015-09-19 ENCOUNTER — Other Ambulatory Visit: Payer: Self-pay | Admitting: Neurology

## 2015-09-26 ENCOUNTER — Other Ambulatory Visit: Payer: Self-pay | Admitting: *Deleted

## 2015-09-26 DIAGNOSIS — C561 Malignant neoplasm of right ovary: Secondary | ICD-10-CM

## 2015-09-27 ENCOUNTER — Ambulatory Visit (HOSPITAL_BASED_OUTPATIENT_CLINIC_OR_DEPARTMENT_OTHER): Payer: Medicare Other

## 2015-09-27 VITALS — BP 125/80 | HR 85 | Temp 98.3°F | Resp 18

## 2015-09-27 DIAGNOSIS — Z8589 Personal history of malignant neoplasm of other organs and systems: Secondary | ICD-10-CM

## 2015-09-27 DIAGNOSIS — Z452 Encounter for adjustment and management of vascular access device: Secondary | ICD-10-CM | POA: Diagnosis not present

## 2015-09-27 DIAGNOSIS — C561 Malignant neoplasm of right ovary: Secondary | ICD-10-CM

## 2015-09-27 MED ORDER — SODIUM CHLORIDE 0.9 % IJ SOLN
10.0000 mL | Freq: Once | INTRAMUSCULAR | Status: AC
  Administered 2015-09-27: 10 mL
  Filled 2015-09-27: qty 10

## 2015-09-27 MED ORDER — HEPARIN SOD (PORK) LOCK FLUSH 100 UNIT/ML IV SOLN
500.0000 [IU] | Freq: Once | INTRAVENOUS | Status: AC
  Administered 2015-09-27: 500 [IU]
  Filled 2015-09-27: qty 5

## 2015-10-12 ENCOUNTER — Ambulatory Visit (INDEPENDENT_AMBULATORY_CARE_PROVIDER_SITE_OTHER): Payer: Managed Care, Other (non HMO) | Admitting: Urgent Care

## 2015-10-12 VITALS — BP 132/84 | HR 83 | Temp 97.9°F | Resp 16 | Ht 68.0 in | Wt 233.0 lb

## 2015-10-12 DIAGNOSIS — R21 Rash and other nonspecific skin eruption: Secondary | ICD-10-CM | POA: Diagnosis not present

## 2015-10-12 DIAGNOSIS — L259 Unspecified contact dermatitis, unspecified cause: Secondary | ICD-10-CM | POA: Diagnosis not present

## 2015-10-12 MED ORDER — RANITIDINE HCL 150 MG PO TABS: 150.0000 mg | ORAL_TABLET | Freq: Two times a day (BID) | ORAL | Status: DC

## 2015-10-12 MED ORDER — CEPHALEXIN 500 MG PO CAPS: 500.0000 mg | ORAL_CAPSULE | Freq: Three times a day (TID) | ORAL | Status: DC

## 2015-10-12 MED ORDER — CETIRIZINE HCL 10 MG PO TABS: 10.0000 mg | ORAL_TABLET | Freq: Every day | ORAL | Status: DC

## 2015-10-12 NOTE — Patient Instructions (Addendum)
Rash A rash is a change in the color or texture of the skin. There are many different types of rashes. You may have other problems that accompany your rash. CAUSES   Infections.  Allergic reactions. This can include allergies to pets or foods.  Certain medicines.  Exposure to certain chemicals, soaps, or cosmetics.  Heat.  Exposure to poisonous plants.  Tumors, both cancerous and noncancerous. SYMPTOMS   Redness.  Scaly skin.  Itchy skin.  Dry or cracked skin.  Bumps.  Blisters.  Pain. DIAGNOSIS  Your caregiver may do a physical exam to determine what type of rash you have. A skin sample (biopsy) may be taken and examined under a microscope. TREATMENT  Treatment depends on the type of rash you have. Your caregiver may prescribe certain medicines. For serious conditions, you may need to see a skin doctor (dermatologist). HOME CARE INSTRUCTIONS   Avoid the substance that caused your rash.  Do not scratch your rash. This can cause infection.  You may take cool baths to help stop itching.  Only take over-the-counter or prescription medicines as directed by your caregiver.  Keep all follow-up appointments as directed by your caregiver. SEEK IMMEDIATE MEDICAL CARE IF:  You have increasing pain, swelling, or redness.  You have a fever.  You have new or severe symptoms.  You have body aches, diarrhea, or vomiting.  Your rash is not better after 3 days. MAKE SURE YOU:  Understand these instructions.  Will watch your condition.  Will get help right away if you are not doing well or get worse.   This information is not intended to replace advice given to you by your health care provider. Make sure you discuss any questions you have with your health care provider.   Document Released: 04/19/2002 Document Revised: 05/20/2014 Document Reviewed: 09/14/2014 Elsevier Interactive Patient Education 2016 [employer].     IF you received an x-ray today, you will  receive an invoice from [facility]. Please contact [facility] Radiology at [phone] with questions or concerns regarding your invoice.   IF you received labwork today, you will receive an invoice from [employer]. Please contact [facility] at [phone] with questions or concerns regarding your invoice.   Our billing staff will not be able to assist you with questions regarding bills from these companies.  You will be contacted with the lab results as soon as they are available. The fastest way to get your results is to activate your My Chart account. Instructions are located on the last page of this paperwork. If you have not heard from [place] regarding the results in 2 weeks, please contact this office.

## 2015-10-18 ENCOUNTER — Other Ambulatory Visit (INDEPENDENT_AMBULATORY_CARE_PROVIDER_SITE_OTHER): Payer: Medicare Other

## 2015-10-18 DIAGNOSIS — E559 Vitamin D deficiency, unspecified: Secondary | ICD-10-CM

## 2015-10-18 DIAGNOSIS — Z0000 Encounter for general adult medical examination without abnormal findings: Secondary | ICD-10-CM | POA: Diagnosis not present

## 2015-10-18 LAB — BASIC METABOLIC PANEL
BUN: 26 mg/dL — AB (ref 6–23)
CALCIUM: 9.6 mg/dL (ref 8.4–10.5)
CO2: 28 mEq/L (ref 19–32)
CREATININE: 0.99 mg/dL (ref 0.40–1.20)
Chloride: 104 mEq/L (ref 96–112)
GFR: 74.84 mL/min (ref >60.00)
Glucose, Bld: 87 mg/dL (ref 70–99)
Potassium: 3.8 mEq/L (ref 3.5–5.1)
Sodium: 140 mEq/L (ref 135–145)

## 2015-10-18 LAB — LIPID PANEL
CHOLESTEROL: 159 mg/dL (ref 0–200)
HDL: 38.9 mg/dL — ABNORMAL LOW (ref >39.00)
LDL CALC: 83 mg/dL (ref 0–99)
NonHDL: 120.15
TRIGLYCERIDES: 187 mg/dL — AB (ref 0.0–149.0)
Total CHOL/HDL Ratio: 4
VLDL: 37.4 mg/dL (ref 0.0–40.0)

## 2015-10-18 LAB — CBC WITH DIFFERENTIAL/PLATELET
BASOS ABS: 0 10*3/uL (ref 0.0–0.1)
BASOS PCT: 0.3 % (ref 0.0–3.0)
EOS ABS: 0.1 10*3/uL (ref 0.0–0.7)
Eosinophils Relative: 2.1 % (ref 0.0–5.0)
HCT: 41.5 % (ref 36.0–46.0)
HEMOGLOBIN: 13.8 g/dL (ref 12.0–15.0)
Lymphocytes Relative: 30.2 % (ref 12.0–46.0)
Lymphs Abs: 1.6 10*3/uL (ref 0.7–4.0)
MCHC: 33.3 g/dL (ref 30.0–36.0)
MCV: 87.1 fl (ref 78.0–100.0)
MONO ABS: 0.3 10*3/uL (ref 0.1–1.0)
Monocytes Relative: 6.4 % (ref 3.0–12.0)
Neutro Abs: 3.2 10*3/uL (ref 1.4–7.7)
Neutrophils Relative %: 61 % (ref 43.0–77.0)
Platelets: 206 10*3/uL (ref 150.0–400.0)
RBC: 4.76 Mil/uL (ref 3.87–5.11)
RDW: 14.5 % (ref 11.5–15.5)
WBC: 5.2 10*3/uL (ref 4.0–10.5)

## 2015-10-18 LAB — HEPATIC FUNCTION PANEL
ALT: 16 U/L (ref 0–35)
AST: 18 U/L (ref 0–37)
Albumin: 3.7 g/dL (ref 3.5–5.2)
Alkaline Phosphatase: 91 U/L (ref 39–117)
BILIRUBIN TOTAL: 0.5 mg/dL (ref 0.2–1.2)
Bilirubin, Direct: 0.1 mg/dL (ref 0.0–0.3)
Total Protein: 6.3 g/dL (ref 6.0–8.3)

## 2015-10-18 LAB — VITAMIN D 25 HYDROXY (VIT D DEFICIENCY, FRACTURES): VITD: 30.32 ng/mL (ref 30.00–100.00)

## 2015-10-18 LAB — TSH: TSH: 3.31 u[IU]/mL (ref 0.35–4.50)

## 2015-10-18 NOTE — Progress Notes (Signed)
    MRN: [id number] DOB: 03-Nov-1960  Subjective:   [name] is a 55 y.o. female presenting for chief complaint of Rash  Reports 4 day history of severely pruritic rash over arms, legs and face. Reports that the rash stings over her forehead and scalp. Patient spent time fishing this weekend, reports that she did not put mosquito repellent and suffered many bites. She has since been cleaning her skin with alcohol pads. Denies fever, shob, chest tightness, cough, n/v, abdominal pain. Denies rash on palms and soles of feet, no oral lesions. Denies smoking cigarettes.  Sapna has a current medication list which includes the following prescription(s): aspirin, butalbital-acetaminophen-caffeine, donepezil, duloxetine, ergocalciferol, lyrica, multivitamin with minerals, orphenadrine, potassium chloride, simvastatin, triamterene-hydrochlorothiazide, zolpidem, cephalexin, cetirizine, ranitidine, and varenicline. Also is allergic to codeine and orange.  Willie  has a past medical history of pulmonary embolism; Obesity; Metrorrhagia; Allergic rhinitis; Iron deficiency anemia; Hypertension; Alopecia; Vitamin D deficiency; HLD (hyperlipidemia); Adjustment disorder; Anxiety and depression; Raynaud's syndrome; Right groin mass; Adenocarcinoma ([place]); Squamous cell carcinoma of skin; Ovarian cancer ([place]) (48); Vision abnormalities; Neuropathy ([place]); BRCA1 positive (01/27/2013); and Ovarian cancer genetic susceptibility (01/27/2013). Also  has past surgical history that includes Breast reduction surgery (2000); Gastric bypass (1979); Tonsillectomy; Cesarean section; Abdominal hysterectomy (05/2008); and Colonoscopy (2004; 12/07/10).  Her family history includes Breast cancer in her maternal grandmother; Hypertension in her father; Pulmonary embolism in her mother.   Objective:   Vitals: BP 132/84 mmHg  Pulse 83  Temp(Src) 97.9 F (36.6 C)  Resp 16  Ht 5' 8"  (1.727 m)  Wt 233 lb (105.688 kg)  BMI 35.44  kg/m2  SpO2 97%  Physical Exam  Constitutional: She is oriented to person, place, and time. She appears well-developed and well-nourished.  HENT:  Mouth/Throat: Oropharynx is clear and moist.  Eyes: No scleral icterus.  Neck: Normal range of motion. Neck supple.  Cardiovascular: Normal rate, regular rhythm and intact distal pulses.  Exam reveals no gallop and no friction rub.   No murmur heard. Pulmonary/Chest: No respiratory distress. She has no wheezes. She has no rales.  Musculoskeletal: She exhibits no edema.  Lymphadenopathy:    She has no cervical adenopathy.  Neurological: She is alert and oriented to person, place, and time.  Skin: Skin is warm and dry. Rash (multiple <0.5cm lesions, excoriations scattered over scalp, chest, back and arms) noted.   Assessment and Plan :   1. Rash and nonspecific skin eruption 2. Contact dermatitis - Start Keflex to address infectious process. Use Zyrtec and Zantac for itching. Stop using alcohol prep pads. RTC in 1 week if no improvement or worsening symptoms.  Jaynee Eagles, PA-C [facility] [phone] 10/18/2015 7:58 AM

## 2015-10-20 ENCOUNTER — Other Ambulatory Visit: Payer: Self-pay | Admitting: Internal Medicine

## 2015-10-20 NOTE — Telephone Encounter (Signed)
Sent to the pharmacy by e-scribe.  Pt has cpx scheduled for 10/25/15

## 2015-10-24 NOTE — Progress Notes (Signed)
Document opened and reviewed for OV but appt  canceled same day .  

## 2015-10-25 ENCOUNTER — Encounter: Payer: Medicare Other | Admitting: Internal Medicine

## 2015-11-08 ENCOUNTER — Ambulatory Visit: Payer: Medicare Other

## 2015-11-08 NOTE — Progress Notes (Signed)
Pt was a NO SHOE for Flush appointment

## 2015-11-22 ENCOUNTER — Encounter: Payer: Self-pay | Admitting: Internal Medicine

## 2015-11-22 ENCOUNTER — Ambulatory Visit (INDEPENDENT_AMBULATORY_CARE_PROVIDER_SITE_OTHER): Payer: Medicare Other | Admitting: Internal Medicine

## 2015-11-22 VITALS — BP 116/78 | Temp 97.8°F | Ht 67.0 in | Wt 232.9 lb

## 2015-11-22 DIAGNOSIS — F17200 Nicotine dependence, unspecified, uncomplicated: Secondary | ICD-10-CM

## 2015-11-22 DIAGNOSIS — M25511 Pain in right shoulder: Secondary | ICD-10-CM

## 2015-11-22 DIAGNOSIS — Z1509 Genetic susceptibility to other malignant neoplasm: Secondary | ICD-10-CM

## 2015-11-22 DIAGNOSIS — Z9181 History of falling: Secondary | ICD-10-CM

## 2015-11-22 DIAGNOSIS — R4189 Other symptoms and signs involving cognitive functions and awareness: Secondary | ICD-10-CM

## 2015-11-22 DIAGNOSIS — E785 Hyperlipidemia, unspecified: Secondary | ICD-10-CM | POA: Diagnosis not present

## 2015-11-22 DIAGNOSIS — Z79899 Other long term (current) drug therapy: Secondary | ICD-10-CM

## 2015-11-22 DIAGNOSIS — Z0000 Encounter for general adult medical examination without abnormal findings: Secondary | ICD-10-CM | POA: Diagnosis not present

## 2015-11-22 DIAGNOSIS — G620 Drug-induced polyneuropathy: Secondary | ICD-10-CM | POA: Diagnosis not present

## 2015-11-22 DIAGNOSIS — T451X5A Adverse effect of antineoplastic and immunosuppressive drugs, initial encounter: Secondary | ICD-10-CM

## 2015-11-22 DIAGNOSIS — Z1501 Genetic susceptibility to malignant neoplasm of breast: Secondary | ICD-10-CM

## 2015-11-22 DIAGNOSIS — R29818 Other symptoms and signs involving the nervous system: Secondary | ICD-10-CM

## 2015-11-22 DIAGNOSIS — Z1502 Genetic susceptibility to malignant neoplasm of ovary: Secondary | ICD-10-CM

## 2015-11-22 NOTE — Patient Instructions (Addendum)
Stopping  Tobacco   Best for your health , Avoid   Simple processed carbs and  Sugars  .  Tracking is the best way to change  To healthy behavior.   Can see sports medicine  about the shoulder   Poss injury     If  persistent or progressive  Can get hep C  Screen at  Next blood draw or .   At this time  Continue  Fall prevention and caution with turning.      Shoulder Pain The shoulder is the joint that connects your arms to your body. The bones that form the shoulder joint include the upper arm bone (humerus), the shoulder blade (scapula), and the collarbone (clavicle). The top of the humerus is shaped like a ball and fits into a rather flat socket on the scapula (glenoid cavity). A combination of muscles and strong, fibrous tissues that connect muscles to bones (tendons) support your shoulder joint and hold the ball in the socket. Small, fluid-filled sacs (bursae) are located in different areas of the joint. They act as cushions between the bones and the overlying soft tissues and help reduce friction between the gliding tendons and the bone as you move your arm. Your shoulder joint allows a wide range of motion in your arm. This range of motion allows you to do things like scratch your back or throw a ball. However, this range of motion also makes your shoulder more prone to pain from overuse and injury. Causes of shoulder pain can originate from both injury and overuse and usually can be grouped in the following four categories:  Redness, swelling, and pain (inflammation) of the tendon (tendinitis) or the bursae (bursitis).  Instability, such as a dislocation of the joint.  Inflammation of the joint (arthritis).  Broken bone (fracture). HOME CARE INSTRUCTIONS   Apply ice to the sore area.  Put ice in a plastic bag.  Place a towel between your skin and the bag.  Leave the ice on for 15-20 minutes, 3-4 times per day for the first 2 days, or as directed by your health care  provider.  Stop using cold packs if they do not help with the pain.  If you have a shoulder sling or immobilizer, wear it as long as your caregiver instructs. Only remove it to shower or bathe. Move your arm as little as possible, but keep your hand moving to prevent swelling.  Squeeze a soft ball or foam pad as much as possible to help prevent swelling.  Only take over-the-counter or prescription medicines for pain, discomfort, or fever as directed by your caregiver. SEEK MEDICAL CARE IF:   Your shoulder pain increases, or new pain develops in your arm, hand, or fingers.  Your hand or fingers become cold and numb.  Your pain is not relieved with medicines. SEEK IMMEDIATE MEDICAL CARE IF:   Your arm, hand, or fingers are numb or tingling.  Your arm, hand, or fingers are significantly swollen or turn white or blue. MAKE SURE YOU:   Understand these instructions.  Will watch your condition.  Will get help right away if you are not doing well or get worse.   This information is not intended to replace advice given to you by your health care provider. Make sure you discuss any questions you have with your health care provider.   Document Released: 02/06/2005 Document Revised: 05/20/2014 Document Reviewed: 08/22/2014 Elsevier Interactive Patient Education 2016 [place] Maintenance, Female Adopting  a healthy lifestyle and getting preventive care can go a long way to promote health and wellness. Talk with your health care provider about what schedule of regular examinations is right for you. This is a good chance for you to check in with your provider about disease prevention and staying healthy. In between checkups, there are plenty of things you can do on your own. Experts have done a lot of research about which lifestyle changes and preventive measures are most likely to keep you healthy. Ask your health care provider for more information. WEIGHT AND DIET  Eat a  healthy diet  Be sure to include plenty of vegetables, fruits, low-fat dairy products, and lean protein.  Do not eat a lot of foods high in solid fats, added sugars, or salt.  Get regular exercise. This is one of the most important things you can do for your health.  Most adults should exercise for at least 150 minutes each week. The exercise should increase your heart rate and make you sweat (moderate-intensity exercise).  Most adults should also do strengthening exercises at least twice a week. This is in addition to the moderate-intensity exercise.  Maintain a healthy weight  Body mass index (BMI) is a measurement that can be used to identify possible weight problems. It estimates body fat based on height and weight. Your health care provider can help determine your BMI and help you achieve or maintain a healthy weight.  For females 62 years of age and older:   A BMI below 18.5 is considered underweight.  A BMI of 18.5 to 24.9 is normal.  A BMI of 25 to 29.9 is considered overweight.  A BMI of 30 and above is considered obese.  Watch levels of cholesterol and blood lipids  You should start having your blood tested for lipids and cholesterol at 55 years of age, then have this test every 5 years.  You may need to have your cholesterol levels checked more often if:  Your lipid or cholesterol levels are high.  You are older than 55 years of age.  You are at high risk for heart disease.  CANCER SCREENING   Lung Cancer  Lung cancer screening is recommended for adults 70-16 years old who are at high risk for lung cancer because of a history of smoking.  A yearly low-dose CT scan of the lungs is recommended for people who:  Currently smoke.  Have quit within the past 15 years.  Have at least a 30-pack-year history of smoking. A pack year is smoking an average of one pack of cigarettes a day for 1 year.  Yearly screening should continue until it has been 15 years since  you quit.  Yearly screening should stop if you develop a health problem that would prevent you from having lung cancer treatment.  Breast Cancer  Practice breast self-awareness. This means understanding how your breasts normally appear and feel.  It also means doing regular breast self-exams. Let your health care provider know about any changes, no matter how small.  If you are in your 20s or 30s, you should have a clinical breast exam (CBE) by a health care provider every 1-3 years as part of a regular health exam.  If you are 73 or older, have a CBE every year. Also consider having a breast X-ray (mammogram) every year.  If you have a family history of breast cancer, talk to your health care provider about genetic screening.  If you are at high  risk for breast cancer, talk to your health care provider about having an MRI and a mammogram every year.  Breast cancer gene (BRCA) assessment is recommended for women who have family members with BRCA-related cancers. BRCA-related cancers include:  Breast.  Ovarian.  Tubal.  Peritoneal cancers.  Results of the assessment will determine the need for genetic counseling and BRCA1 and BRCA2 testing. Cervical Cancer Your health care provider may recommend that you be screened regularly for cancer of the pelvic organs (ovaries, uterus, and vagina). This screening involves a pelvic examination, including checking for microscopic changes to the surface of your cervix (Pap test). You may be encouraged to have this screening done every 3 years, beginning at age 22.  For women ages 78-65, health care providers may recommend pelvic exams and Pap testing every 3 years, or they may recommend the Pap and pelvic exam, combined with testing for human papilloma virus (HPV), every 5 years. Some types of HPV increase your risk of cervical cancer. Testing for HPV may also be done on women of any age with unclear Pap test results.  Other health care providers  may not recommend any screening for nonpregnant women who are considered low risk for pelvic cancer and who do not have symptoms. Ask your health care provider if a screening pelvic exam is right for you.  If you have had past treatment for cervical cancer or a condition that could lead to cancer, you need Pap tests and screening for cancer for at least 20 years after your treatment. If Pap tests have been discontinued, your risk factors (such as having a new sexual partner) need to be reassessed to determine if screening should resume. Some women have medical problems that increase the chance of getting cervical cancer. In these cases, your health care provider may recommend more frequent screening and Pap tests. Colorectal Cancer  This type of cancer can be detected and often prevented.  Routine colorectal cancer screening usually begins at 55 years of age and continues through 55 years of age.  Your health care provider may recommend screening at an earlier age if you have risk factors for colon cancer.  Your health care provider may also recommend using home test kits to check for hidden blood in the stool.  A small camera at the end of a tube can be used to examine your colon directly (sigmoidoscopy or colonoscopy). This is done to check for the earliest forms of colorectal cancer.  Routine screening usually begins at age 83.  Direct examination of the colon should be repeated every 5-10 years through 55 years of age. However, you may need to be screened more often if early forms of precancerous polyps or small growths are found. Skin Cancer  Check your skin from head to toe regularly.  Tell your health care provider about any new moles or changes in moles, especially if there is a change in a mole's shape or color.  Also tell your health care provider if you have a mole that is larger than the size of a pencil eraser.  Always use sunscreen. Apply sunscreen liberally and repeatedly  throughout the day.  Protect yourself by wearing long sleeves, pants, a wide-brimmed hat, and sunglasses whenever you are outside. HEART DISEASE, DIABETES, AND HIGH BLOOD PRESSURE   High blood pressure causes heart disease and increases the risk of stroke. High blood pressure is more likely to develop in:  People who have blood pressure in the high end of the normal  range (130-139/85-89 mm Hg).  People who are overweight or obese.  People who are African American.  If you are 72-35 years of age, have your blood pressure checked every 3-5 years. If you are 59 years of age or older, have your blood pressure checked every year. You should have your blood pressure measured twice--once when you are at a hospital or clinic, and once when you are not at a hospital or clinic. Record the average of the two measurements. To check your blood pressure when you are not at a hospital or clinic, you can use:  An automated blood pressure machine at a pharmacy.  A home blood pressure monitor.  If you are between 76 years and 16 years old, ask your health care provider if you should take aspirin to prevent strokes.  Have regular diabetes screenings. This involves taking a blood sample to check your fasting blood sugar level.  If you are at a normal weight and have a low risk for diabetes, have this test once every three years after 55 years of age.  If you are overweight and have a high risk for diabetes, consider being tested at a younger age or more often. PREVENTING INFECTION  Hepatitis B  If you have a higher risk for hepatitis B, you should be screened for this virus. You are considered at high risk for hepatitis B if:  You were born in a country where hepatitis B is common. Ask your health care provider which countries are considered high risk.  Your parents were born in a high-risk country, and you have not been immunized against hepatitis B (hepatitis B vaccine).  You have HIV or  AIDS.  You use needles to inject street drugs.  You live with someone who has hepatitis B.  You have had sex with someone who has hepatitis B.  You get hemodialysis treatment.  You take certain medicines for conditions, including cancer, organ transplantation, and autoimmune conditions. Hepatitis C  Blood testing is recommended for:  Everyone born from [birth year] through 1965.  Anyone with known risk factors for hepatitis C. Sexually transmitted infections (STIs)  You should be screened for sexually transmitted infections (STIs) including gonorrhea and chlamydia if:  You are sexually active and are younger than 55 years of age.  You are older than 55 years of age and your health care provider tells you that you are at risk for this type of infection.  Your sexual activity has changed since you were last screened and you are at an increased risk for chlamydia or gonorrhea. Ask your health care provider if you are at risk.  If you do not have HIV, but are at risk, it may be recommended that you take a prescription medicine daily to prevent HIV infection. This is called pre-exposure prophylaxis (PrEP). You are considered at risk if:  You are sexually active and do not regularly use condoms or know the HIV status of your partner(s).  You take drugs by injection.  You are sexually active with a partner who has HIV. Talk with your health care provider about whether you are at high risk of being infected with HIV. If you choose to begin PrEP, you should first be tested for HIV. You should then be tested every 3 months for as long as you are taking PrEP.  PREGNANCY   If you are premenopausal and you may become pregnant, ask your health care provider about preconception counseling.  If you may become pregnant, take 400  to 800 micrograms (mcg) of folic acid every day.  If you want to prevent pregnancy, talk to your health care provider about birth control (contraception). OSTEOPOROSIS AND  MENOPAUSE   Osteoporosis is a disease in which the bones lose minerals and strength with aging. This can result in serious bone fractures. Your risk for osteoporosis can be identified using a bone density scan.  If you are 40 years of age or older, or if you are at risk for osteoporosis and fractures, ask your health care provider if you should be screened.  Ask your health care provider whether you should take a calcium or vitamin D supplement to lower your risk for osteoporosis.  Menopause may have certain physical symptoms and risks.  Hormone replacement therapy may reduce some of these symptoms and risks. Talk to your health care provider about whether hormone replacement therapy is right for you.  HOME CARE INSTRUCTIONS   Schedule regular health, dental, and eye exams.  Stay current with your immunizations.   Do not use any tobacco products including cigarettes, chewing tobacco, or electronic cigarettes.  If you are pregnant, do not drink alcohol.  If you are breastfeeding, limit how much and how often you drink alcohol.  Limit alcohol intake to no more than 1 drink per day for nonpregnant women. One drink equals 12 ounces of beer, 5 ounces of wine, or 1 ounces of hard liquor.  Do not use street drugs.  Do not share needles.  Ask your health care provider for help if you need support or information about quitting drugs.  Tell your health care provider if you often feel depressed.  Tell your health care provider if you have ever been abused or do not feel safe at home.   This information is not intended to replace advice given to you by your health care provider. Make sure you discuss any questions you have with your health care provider.   Document Released: 11/12/2010 Document Revised: 05/20/2014 Document Reviewed: 03/31/2013 Elsevier Interactive Patient Education [employer].

## 2015-11-22 NOTE — Progress Notes (Signed)
Pre visit review using our clinic review tool, if applicable. No additional management support is needed unless otherwise documented below in the visit note.  Chief Complaint  Patient presents with  . Medicare Wellness    med review  . Annual Exam    HPI: [name] 55 y.o. comes in today for Preventive Medicare wellness visit  Missed her appt in [place]  Chronic disease management    Med review  No major changes still using tobacco 1/2 ppd  Never took the chantix   Hx of repeated falling for a long time  No cp sob usua;y when turns  Neuropathy contributes and when distracted  Mostly in home less when out and paying attention not time related to meds   Remote hs of  Tinnitus and ? menieres   Better  Sx   Right shoulder  Next day after fall no pop just sore for a few days  .  Health Maintenance  Topic Date Due  . Hepatitis C Screening  [birth year]  . INFLUENZA VACCINE  12/12/2015  . MAMMOGRAM  07/11/2016  . COLONOSCOPY  12/05/2020  . TETANUS/TDAP  11/03/2023  . HIV Screening  Completed   Health Maintenance Review LIFESTYLE:  TAD tobacco no rd  No sig etoh Sugar beverages:n x coffee  Sleep:   on ambien  MEDICARE DOCUMENT QUESTIONS  TO SCAN     Hearing: hard to hear in loud room see above   Vision:  No limitations at present . Last eye check UTD  Safety:  Has smoke detector and wears seat belts.  No firearms. No excess sun exposure. Sees dentist regularly.  Falls:  Frequent usually no injury in home  turnig  Felt from neuropathy and paying attention  Advance directive :  Reviewed   Memory: = about the same? Deficits from rx .  Depression: No anhedonia unusual crying or depressive symptoms on meds   Nutrition: Eats well balanced diet; adequate calcium and vitamin D. No swallowing chewing problems.  Injury: no major injuries in the last six months.  Other healthcare providers:  Reviewed today .  Social:  Lives alone visits son [name]  Every few months   No pets.   Preventive parameters: up-to-date  Reviewed   ADLS:   There are no problems or need for assistance  driving, feeding, obtaining food, dressing, toileting and bathing, managing money using phone. She is independent. momory and balance are the most problematic    ROS:  GEN/ HEENT: No fever, significant weight changes sweats headaches vision problems hearing changes, CV/ PULM; No chest pain shortness of breath cough, syncope,edema  change in exercise tolerance. GI /GU: No adominal pain, vomiting, change in bowel habits. No blood in the stool. No significant GU symptoms. SKIN/HEME: ,no acute skin rashes suspicious lesions or bleeding. No lymphadenopathy, nodules, masses.  NEURO/ PSYCH:  No neurologic signs such as weakness numbness. No depression anxiety. IMM/ Allergy: No unusual infections.  Allergy .   REST of 12 system review negative except as per HPI   Past Medical History  Diagnosis Date  . Hx pulmonary embolism   . Obesity   . Metrorrhagia   . Allergic rhinitis   . Iron deficiency anemia   . Hypertension   . Alopecia   . Vitamin D deficiency   . HLD (hyperlipidemia)   . Adjustment disorder   . Anxiety and depression   . Raynaud's syndrome   . Right groin mass   . 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 Squamous cell carcinoma of skin   . Ovarian cancer ([place]) 48  . Vision abnormalities   . Neuropathy ([place])   . 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    Family History  Problem Relation Age of Onset  . Pulmonary embolism Mother   . Hypertension Father   . Breast cancer Maternal Grandmother     diagnosed in early 49s  . Hyperlipidemia      Social History   Social History  . Marital Status: Divorced    Spouse Name: N/A  . Number of Children: 1  . Years of Education: Master's   Occupational History  .      disabled   Social History Main Topics  . Smoking status: Current Every  Day Smoker -- 1.00 packs/day for 37 years    Types: Cigarettes    Start date: 06/16/1978  . Smokeless tobacco: Never Used     Comment: 2- 7-16  STILL SMOKING  . Alcohol Use: 0.0 oz/week    0 Standard drinks or equivalent per week     Comment: rare, 3 weekly  . Drug Use: No  . Sexual Activity: Not Asked   Other Topics Concern  . None   Social History Narrative   Divorced   1 son /1 adopted adult son  2 dogs    Sales occupation on disability for now   Regular exercise-yes money stress   Daily caffeine use 2-3 and 2 cups of coffee   On since a security disability since 2012   Patient is right-handed.   Patient has a Master's degree.    Outpatient Encounter Prescriptions as of 11/22/2015  Medication Sig  . aspirin 81 MG tablet Take 81 mg by mouth daily. 2 TABS IN PM  . butalbital-acetaminophen-caffeine (FIORICET, ESGIC) 50-325-40 MG tablet Take 1 tablet by mouth every 6 (six) hours as needed for headache.  . donepezil (ARICEPT) 10 MG tablet Take 1 tablet (10 mg total) by mouth every morning.  . DULoxetine (CYMBALTA) 60 MG capsule TAKE ONE CAPSULE BY MOUTH EVERY DAY  . ergocalciferol (VITAMIN D2) 50000 UNITS capsule Take 1 capsule (50,000 Units total) by mouth once a week.  Marland Kitchen LYRICA 150 MG capsule TAKE ONE CAPSULE BY MOUTH TWICE DAILY  . Multiple Vitamin (MULTIVITAMIN WITH MINERALS) TABS tablet Take 1 tablet by mouth daily.  . orphenadrine (NORFLEX) 100 MG tablet TAKE 1 TABLET BY MOUTH TWICE DAILY AS NEEDED FOR MUSCLE SPASMS  . potassium chloride (K-DUR) 10 MEQ tablet TAKE 2 TABLETS BY MOUTH EVERY DAY  . simvastatin (ZOCOR) 40 MG tablet TAKE 1 TABLET BY MOUTH EVERY DAY  . triamterene-hydrochlorothiazide (MAXZIDE-25) 37.5-25 MG tablet Take 2 tablets by mouth daily.  Marland Kitchen zolpidem (AMBIEN CR) 12.5 MG CR tablet TAKE 1 TABLET BY MOUTH EVERY NIGHT AT BEDTIME  . [DISCONTINUED] cephALEXin (KEFLEX) 500 MG capsule Take 1 capsule (500 mg total) by mouth 3 (three) times daily.  . [DISCONTINUED]  cetirizine (ZYRTEC) 10 MG tablet Take 1 tablet (10 mg total) by mouth daily.  . [DISCONTINUED] ranitidine (ZANTAC) 150 MG tablet Take 1 tablet (150 mg total) by mouth 2 (two) times daily.  . [DISCONTINUED] varenicline (CHANTIX STARTING MONTH PAK) 0.5 MG X 11 & 1 MG X 42 tablet Take one 0.5 mg tablet by mouth once daily for 3 days, then increase to one 0.5 mg tablet twice daily for 4 days, then increase to one 1 mg tablet twice daily. (Patient  not taking: Reported on 10/12/2015)   No facility-administered encounter medications on file as of 11/22/2015.    EXAM:  BP 116/78 mmHg  Temp(Src) 97.8 F (36.6 C) (Oral)  Ht 5' 7"  (1.702 m)  Wt 232 lb 14.4 oz (105.643 kg)  BMI 36.47 kg/m2  Body mass index is 36.47 kg/(m^2).  Physical Exam: Vital signs reviewed VFI:EPPI is a well-developed well-nourished alert cooperative   who appears stated age in no acute distress.  HEENT: normocephalic atraumatic , Eyes: PERRL EOM's full, conjunctiva clear, Nares: paten,t no deformity discharge or tenderness., Ears: no deformity EAC's clear TMs with normal landmarks. Mouth: clear OP, no lesions, edema.  Moist mucous membranes. Dentition in adequate repair. NECK: supple without masses, thyromegaly or bruits. CHEST/PULM:  Clear to auscultation and percussion breath sounds equal no wheeze , rales or rhonchi. No chest wall deformities or tenderness. CV: PMI is nondisplaced, S1 S2 no gallops, murmurs, rubs. Peripheral pulses are full without delay.No JVD . Breast: normal by inspection . No dimpling, discharge, masses, tenderness or discharge . Implants  ABDOMEN: Bowel sounds normal nontender  No guard or rebound, no hepato splenomegal no CVA tenderness.   Extremtities:  No clubbing cyanosis or edema, no acute joint swelling or redness no focal atrophy andt rc shoulder tenderness  rom adequate NEURO:  Oriented x3, cranial nerves 3-12 appear to be intact, no obvious focal weakness,gait within normal limits  Dec sensation   Bottom of feet  SKIN: No acute rashes normal turgor, color, no bruising or petechiae. PSYCH: Oriented, good eye contact, no obvious depression anxiety, cognition and judgment appear normal. LN: no cervical axillary inguinal adenopathy  Wt Readings from Last 3 Encounters:  11/22/15 232 lb 14.4 oz (105.643 kg)  10/12/15 233 lb (105.688 kg)  06/19/15 226 lb (102.513 kg)   BP Readings from Last 3 Encounters:  11/22/15 116/78  10/12/15 132/84  09/27/15 125/80     Lab Results  Component Value Date   WBC 5.2 10/18/2015   HGB 13.8 10/18/2015   HCT 41.5 10/18/2015   PLT 206.0 10/18/2015   GLUCOSE 87 10/18/2015   CHOL 159 10/18/2015   TRIG 187.0* 10/18/2015   HDL 38.90* 10/18/2015   LDLDIRECT 98.7 09/04/2009   LDLCALC 83 10/18/2015   ALT 16 10/18/2015   AST 18 10/18/2015   NA 140 10/18/2015   K 3.8 10/18/2015   CL 104 10/18/2015   CREATININE 0.99 10/18/2015   BUN 26* 10/18/2015   CO2 28 10/18/2015   TSH 3.31 10/18/2015   INR 0.86 06/02/2013    ASSESSMENT AND PLAN:  Discussed the following assessment and plan:  Visit for preventive health examination  Medication management  Hyperlipidemia - dsisc intervnetion  offered nutritionist patient declined know  dsic strategies  BRCA gene positive  Neuropathy due to chemotherapeutic drug ([place]) - feels noroflox adn lyrica help some   TOBACCO USE  Neurocognitive deficits  Hx of falling  Right shoulder pain reviewed labs and   Risk benefit of medication discussed. Most meds help her  Patient Care Team: Burnis Medin, MD as PCP - General (Internal Medicine) Gatha Mayer, MD (Gastroenterology) Tyrone Sage, PsyD (Psychiatry) Delila Pereyra, MD (Obstetrics and Gynecology) [name], MD as Attending Physician (Internal Medicine) Larey Seat, MD (Neurology)  Patient Instructions  Stopping  Tobacco   Best for your health , Avoid   Simple processed carbs and  Sugars  .  Tracking is the best way to change  To  healthy behavior.  Can see sports medicine  about the shoulder   Poss injury     If  persistent or progressive  Can get hep C  Screen at  Next blood draw or .   At this time  Continue  Fall prevention and caution with turning.      Shoulder Pain The shoulder is the joint that connects your arms to your body. The bones that form the shoulder joint include the upper arm bone (humerus), the shoulder blade (scapula), and the collarbone (clavicle). The top of the humerus is shaped like a ball and fits into a rather flat socket on the scapula (glenoid cavity). A combination of muscles and strong, fibrous tissues that connect muscles to bones (tendons) support your shoulder joint and hold the ball in the socket. Small, fluid-filled sacs (bursae) are located in different areas of the joint. They act as cushions between the bones and the overlying soft tissues and help reduce friction between the gliding tendons and the bone as you move your arm. Your shoulder joint allows a wide range of motion in your arm. This range of motion allows you to do things like scratch your back or throw a ball. However, this range of motion also makes your shoulder more prone to pain from overuse and injury. Causes of shoulder pain can originate from both injury and overuse and usually can be grouped in the following four categories:  Redness, swelling, and pain (inflammation) of the tendon (tendinitis) or the bursae (bursitis).  Instability, such as a dislocation of the joint.  Inflammation of the joint (arthritis).  Broken bone (fracture). HOME CARE INSTRUCTIONS   Apply ice to the sore area.  Put ice in a plastic bag.  Place a towel between your skin and the bag.  Leave the ice on for 15-20 minutes, 3-4 times per day for the first 2 days, or as directed by your health care provider.  Stop using cold packs if they do not help with the pain.  If you have a shoulder sling or immobilizer, wear it as long as your  caregiver instructs. Only remove it to shower or bathe. Move your arm as little as possible, but keep your hand moving to prevent swelling.  Squeeze a soft ball or foam pad as much as possible to help prevent swelling.  Only take over-the-counter or prescription medicines for pain, discomfort, or fever as directed by your caregiver. SEEK MEDICAL CARE IF:   Your shoulder pain increases, or new pain develops in your arm, hand, or fingers.  Your hand or fingers become cold and numb.  Your pain is not relieved with medicines. SEEK IMMEDIATE MEDICAL CARE IF:   Your arm, hand, or fingers are numb or tingling.  Your arm, hand, or fingers are significantly swollen or turn white or blue. MAKE SURE YOU:   Understand these instructions.  Will watch your condition.  Will get help right away if you are not doing well or get worse.   This information is not intended to replace advice given to you by your health care provider. Make sure you discuss any questions you have with your health care provider.   Document Released: 02/06/2005 Document Revised: 05/20/2014 Document Reviewed: 08/22/2014 Elsevier Interactive Patient Education 2016 [place] Maintenance, Female Adopting a healthy lifestyle and getting preventive care can go a long way to promote health and wellness. Talk with your health care provider about what schedule of regular examinations is right for you. This is  a good chance for you to check in with your provider about disease prevention and staying healthy. In between checkups, there are plenty of things you can do on your own. Experts have done a lot of research about which lifestyle changes and preventive measures are most likely to keep you healthy. Ask your health care provider for more information. WEIGHT AND DIET  Eat a healthy diet  Be sure to include plenty of vegetables, fruits, low-fat dairy products, and lean protein.  Do not eat a lot of foods high in  solid fats, added sugars, or salt.  Get regular exercise. This is one of the most important things you can do for your health.  Most adults should exercise for at least 150 minutes each week. The exercise should increase your heart rate and make you sweat (moderate-intensity exercise).  Most adults should also do strengthening exercises at least twice a week. This is in addition to the moderate-intensity exercise.  Maintain a healthy weight  Body mass index (BMI) is a measurement that can be used to identify possible weight problems. It estimates body fat based on height and weight. Your health care provider can help determine your BMI and help you achieve or maintain a healthy weight.  For females 54 years of age and older:   A BMI below 18.5 is considered underweight.  A BMI of 18.5 to 24.9 is normal.  A BMI of 25 to 29.9 is considered overweight.  A BMI of 30 and above is considered obese.  Watch levels of cholesterol and blood lipids  You should start having your blood tested for lipids and cholesterol at 55 years of age, then have this test every 5 years.  You may need to have your cholesterol levels checked more often if:  Your lipid or cholesterol levels are high.  You are older than 55 years of age.  You are at high risk for heart disease.  CANCER SCREENING   Lung Cancer  Lung cancer screening is recommended for adults 64-33 years old who are at high risk for lung cancer because of a history of smoking.  A yearly low-dose CT scan of the lungs is recommended for people who:  Currently smoke.  Have quit within the past 15 years.  Have at least a 30-pack-year history of smoking. A pack year is smoking an average of one pack of cigarettes a day for 1 year.  Yearly screening should continue until it has been 15 years since you quit.  Yearly screening should stop if you develop a health problem that would prevent you from having lung cancer treatment.  Breast  Cancer  Practice breast self-awareness. This means understanding how your breasts normally appear and feel.  It also means doing regular breast self-exams. Let your health care provider know about any changes, no matter how small.  If you are in your 20s or 30s, you should have a clinical breast exam (CBE) by a health care provider every 1-3 years as part of a regular health exam.  If you are 21 or older, have a CBE every year. Also consider having a breast X-ray (mammogram) every year.  If you have a family history of breast cancer, talk to your health care provider about genetic screening.  If you are at high risk for breast cancer, talk to your health care provider about having an MRI and a mammogram every year.  Breast cancer gene (BRCA) assessment is recommended for women who have family members with BRCA-related  cancers. BRCA-related cancers include:  Breast.  Ovarian.  Tubal.  Peritoneal cancers.  Results of the assessment will determine the need for genetic counseling and BRCA1 and BRCA2 testing. Cervical Cancer Your health care provider may recommend that you be screened regularly for cancer of the pelvic organs (ovaries, uterus, and vagina). This screening involves a pelvic examination, including checking for microscopic changes to the surface of your cervix (Pap test). You may be encouraged to have this screening done every 3 years, beginning at age 63.  For women ages 64-65, health care providers may recommend pelvic exams and Pap testing every 3 years, or they may recommend the Pap and pelvic exam, combined with testing for human papilloma virus (HPV), every 5 years. Some types of HPV increase your risk of cervical cancer. Testing for HPV may also be done on women of any age with unclear Pap test results.  Other health care providers may not recommend any screening for nonpregnant women who are considered low risk for pelvic cancer and who do not have symptoms. Ask your  health care provider if a screening pelvic exam is right for you.  If you have had past treatment for cervical cancer or a condition that could lead to cancer, you need Pap tests and screening for cancer for at least 20 years after your treatment. If Pap tests have been discontinued, your risk factors (such as having a new sexual partner) need to be reassessed to determine if screening should resume. Some women have medical problems that increase the chance of getting cervical cancer. In these cases, your health care provider may recommend more frequent screening and Pap tests. Colorectal Cancer  This type of cancer can be detected and often prevented.  Routine colorectal cancer screening usually begins at 55 years of age and continues through 55 years of age.  Your health care provider may recommend screening at an earlier age if you have risk factors for colon cancer.  Your health care provider may also recommend using home test kits to check for hidden blood in the stool.  A small camera at the end of a tube can be used to examine your colon directly (sigmoidoscopy or colonoscopy). This is done to check for the earliest forms of colorectal cancer.  Routine screening usually begins at age 73.  Direct examination of the colon should be repeated every 5-10 years through 55 years of age. However, you may need to be screened more often if early forms of precancerous polyps or small growths are found. Skin Cancer  Check your skin from head to toe regularly.  Tell your health care provider about any new moles or changes in moles, especially if there is a change in a mole's shape or color.  Also tell your health care provider if you have a mole that is larger than the size of a pencil eraser.  Always use sunscreen. Apply sunscreen liberally and repeatedly throughout the day.  Protect yourself by wearing long sleeves, pants, a wide-brimmed hat, and sunglasses whenever you are outside. HEART  DISEASE, DIABETES, AND HIGH BLOOD PRESSURE   High blood pressure causes heart disease and increases the risk of stroke. High blood pressure is more likely to develop in:  People who have blood pressure in the high end of the normal range (130-139/85-89 mm Hg).  People who are overweight or obese.  People who are African American.  If you are 22-37 years of age, have your blood pressure checked every 3-5 years. If you  are 30 years of age or older, have your blood pressure checked every year. You should have your blood pressure measured twice--once when you are at a hospital or clinic, and once when you are not at a hospital or clinic. Record the average of the two measurements. To check your blood pressure when you are not at a hospital or clinic, you can use:  An automated blood pressure machine at a pharmacy.  A home blood pressure monitor.  If you are between 43 years and 55 years old, ask your health care provider if you should take aspirin to prevent strokes.  Have regular diabetes screenings. This involves taking a blood sample to check your fasting blood sugar level.  If you are at a normal weight and have a low risk for diabetes, have this test once every three years after 55 years of age.  If you are overweight and have a high risk for diabetes, consider being tested at a younger age or more often. PREVENTING INFECTION  Hepatitis B  If you have a higher risk for hepatitis B, you should be screened for this virus. You are considered at high risk for hepatitis B if:  You were born in a country where hepatitis B is common. Ask your health care provider which countries are considered high risk.  Your parents were born in a high-risk country, and you have not been immunized against hepatitis B (hepatitis B vaccine).  You have HIV or AIDS.  You use needles to inject street drugs.  You live with someone who has hepatitis B.  You have had sex with someone who has hepatitis  B.  You get hemodialysis treatment.  You take certain medicines for conditions, including cancer, organ transplantation, and autoimmune conditions. Hepatitis C  Blood testing is recommended for:  Everyone born from [birth year] through 1965.  Anyone with known risk factors for hepatitis C. Sexually transmitted infections (STIs)  You should be screened for sexually transmitted infections (STIs) including gonorrhea and chlamydia if:  You are sexually active and are younger than 55 years of age.  You are older than 55 years of age and your health care provider tells you that you are at risk for this type of infection.  Your sexual activity has changed since you were last screened and you are at an increased risk for chlamydia or gonorrhea. Ask your health care provider if you are at risk.  If you do not have HIV, but are at risk, it may be recommended that you take a prescription medicine daily to prevent HIV infection. This is called pre-exposure prophylaxis (PrEP). You are considered at risk if:  You are sexually active and do not regularly use condoms or know the HIV status of your partner(s).  You take drugs by injection.  You are sexually active with a partner who has HIV. Talk with your health care provider about whether you are at high risk of being infected with HIV. If you choose to begin PrEP, you should first be tested for HIV. You should then be tested every 3 months for as long as you are taking PrEP.  PREGNANCY   If you are premenopausal and you may become pregnant, ask your health care provider about preconception counseling.  If you may become pregnant, take 400 to 800 micrograms (mcg) of folic acid every day.  If you want to prevent pregnancy, talk to your health care provider about birth control (contraception). OSTEOPOROSIS AND MENOPAUSE   Osteoporosis is a disease  in which the bones lose minerals and strength with aging. This can result in serious bone fractures. Your  risk for osteoporosis can be identified using a bone density scan.  If you are 5 years of age or older, or if you are at risk for osteoporosis and fractures, ask your health care provider if you should be screened.  Ask your health care provider whether you should take a calcium or vitamin D supplement to lower your risk for osteoporosis.  Menopause may have certain physical symptoms and risks.  Hormone replacement therapy may reduce some of these symptoms and risks. Talk to your health care provider about whether hormone replacement therapy is right for you.  HOME CARE INSTRUCTIONS   Schedule regular health, dental, and eye exams.  Stay current with your immunizations.   Do not use any tobacco products including cigarettes, chewing tobacco, or electronic cigarettes.  If you are pregnant, do not drink alcohol.  If you are breastfeeding, limit how much and how often you drink alcohol.  Limit alcohol intake to no more than 1 drink per day for nonpregnant women. One drink equals 12 ounces of beer, 5 ounces of wine, or 1 ounces of hard liquor.  Do not use street drugs.  Do not share needles.  Ask your health care provider for help if you need support or information about quitting drugs.  Tell your health care provider if you often feel depressed.  Tell your health care provider if you have ever been abused or do not feel safe at home.   This information is not intended to replace advice given to you by your health care provider. Make sure you discuss any questions you have with your health care provider.   Document Released: 11/12/2010 Document Revised: 05/20/2014 Document Reviewed: 03/31/2013 Elsevier Interactive Patient Education 2016 Olivet K. Panosh M.D.

## 2015-11-23 ENCOUNTER — Other Ambulatory Visit: Payer: Self-pay | Admitting: Hematology & Oncology

## 2015-11-23 ENCOUNTER — Other Ambulatory Visit: Payer: Self-pay | Admitting: Internal Medicine

## 2015-11-24 NOTE — Telephone Encounter (Signed)
Potassium maxide   For 1 year lyrica for 6 months norflox  X 2 refills

## 2015-11-24 NOTE — Telephone Encounter (Signed)
Potassium, Maxide and Norflex sent to the pharmacy by e-scribe.  Lyrica called in and left on machine.

## 2015-11-28 ENCOUNTER — Encounter: Payer: Self-pay | Admitting: Neurology

## 2015-11-28 ENCOUNTER — Ambulatory Visit (INDEPENDENT_AMBULATORY_CARE_PROVIDER_SITE_OTHER): Payer: Medicare Other | Admitting: Neurology

## 2015-11-28 VITALS — BP 120/88 | HR 86 | Resp 20 | Ht 69.0 in | Wt 226.0 lb

## 2015-11-28 DIAGNOSIS — G63 Polyneuropathy in diseases classified elsewhere: Secondary | ICD-10-CM | POA: Diagnosis not present

## 2015-11-28 DIAGNOSIS — C801 Malignant (primary) neoplasm, unspecified: Secondary | ICD-10-CM

## 2015-11-28 MED ORDER — ZOLPIDEM TARTRATE 10 MG PO TABS: 10.0000 mg | ORAL_TABLET | Freq: Every evening | ORAL | Status: DC | PRN

## 2015-11-28 NOTE — Patient Instructions (Signed)
Neuropathic Pain Neuropathic pain is pain caused by damage to the nerves that are responsible for certain sensations in your body (sensory nerves). The pain can be caused by damage to:   The sensory nerves that send signals to your spinal cord and brain (peripheral nervous system).  The sensory nerves in your brain or spinal cord (central nervous system). Neuropathic pain can make you more sensitive to pain. What would be a minor sensation for most people may feel very painful if you have neuropathic pain. This is usually a long-term condition that can be difficult to treat. The type of pain can differ from person to person. It may start suddenly (acute), or it may develop slowly and last for a long time (chronic). Neuropathic pain may come and go as damaged nerves heal or may stay at the same level for years. It often causes emotional distress, loss of sleep, and a lower quality of life. CAUSES  The most common cause of damage to a sensory nerve is diabetes. Many other diseases and conditions can also cause neuropathic pain. Causes of neuropathic pain can be classified as:  Toxic. Many drugs and chemicals can cause toxic damage. The most common cause of toxic neuropathic pain is damage from drug treatment for cancer (chemotherapy).  Metabolic. This type of pain can happen when a disease causes imbalances that damage nerves. Diabetes is the most common of these diseases. Vitamin B deficiency caused by long-term alcohol abuse is another common cause.  Traumatic. Any injury that cuts, crushes, or stretches a nerve can cause damage and pain. A common example is feeling pain after losing an arm or leg (phantom limb pain).  Compression-related. If a sensory nerve gets trapped or compressed for a long period of time, the blood supply to the nerve can be cut off.  Vascular. Many blood vessel diseases can cause neuropathic pain by decreasing blood supply and oxygen to nerves.  Autoimmune. This type of  pain results from diseases in which the body's defense system mistakenly attacks sensory nerves. Examples of autoimmune diseases that can cause neuropathic pain include lupus and multiple sclerosis.  Infectious. Many types of viral infections can damage sensory nerves and cause pain. Shingles infection is a common cause of this type of pain.  Inherited. Neuropathic pain can be a symptom of many diseases that are passed down through families (genetic). SIGNS AND SYMPTOMS  The main symptom is pain. Neuropathic pain is often described as:  Burning.  Shock-like.  Stinging.  Hot or cold.  Itching. DIAGNOSIS  No single test can diagnose neuropathic pain. Your health care provider will do a physical exam and ask you about your pain. You may use a pain scale to describe how bad your pain is. You may also have tests to see if you have a high sensitivity to pain and to help find the cause and location of any sensory nerve damage. These tests may include:  Imaging studies, such as:  X-rays.  CT scan.  MRI.  Nerve conduction studies to test how well nerve signals travel through your sensory nerves (electrodiagnostic testing).  Stimulating your sensory nerves through electrodes on your skin and measuring the response in your spinal cord and brain (somatosensory evoked potentials). TREATMENT  Treatment for neuropathic pain may change over time. You may need to try different treatment options or a combination of treatments. Some options include:  Over-the-counter pain relievers.  Prescription medicines. Some medicines used to treat other conditions may also help neuropathic pain. These  include medicines to:  Control seizures (anticonvulsants).  Relieve depression (antidepressants).  Prescription-strength pain relievers (narcotics). These are usually used when other pain relievers do not help.  Transcutaneous nerve stimulation (TENS). This uses electrical currents to block painful nerve  signals. The treatment is painless.  Topical and local anesthetics. These are medicines that numb the nerves. They can be injected as a nerve block or applied to the skin.  Alternative treatments, such as:  Acupuncture.  Meditation.  Massage.  Physical therapy.  Pain management programs.  Counseling. HOME CARE INSTRUCTIONS  Learn as much as you can about your condition.  Take medicines only as directed by your health care provider.  Work closely with all your health care providers to find what works best for you.  Have a good support system at home.  Consider joining a chronic pain support group. SEEK MEDICAL CARE IF:  Your pain treatments are not helping.  You are having side effects from your medicines.  You are struggling with fatigue, mood changes, depression, or anxiety.   This information is not intended to replace advice given to you by your health care provider. Make sure you discuss any questions you have with your health care provider.   Document Released: 01/25/2004 Document Revised: 05/20/2014 Document Reviewed: 10/07/2013 Elsevier Interactive Patient Education [employer].

## 2015-11-28 NOTE — Progress Notes (Signed)
PATIENT: [name] DOB: 1961/04/01  REASON FOR VISIT: follow up- neuropathy, cognitive changes, insomnia HISTORY FROM: patient  HISTORY OF PRESENT ILLNESS: [name] is a 55 year old female with a history of neuropathy,cognitive changes and insomnia. She returns today for follow-up. The patient continues to take Ambien for insomnia. The patient states that if she does not take this medication she will not be able to go to sleep. The patient currently takes Lyrica 150 mg twice a day for neuropathy. Patient states that the burning and tingling is located in the toes extending to the mid foot bilaterally. She also has burning and tingling in the fingers that extends to the mid hand. Denies any changes with her gait or balance. That she had a fall while standing and watching fireworks. She states that the right leg went numb and cause her to fall. She states that this is the first time this has happened. She states that the numbness resolved. He states that she is not had any additional episodes of numbness. Patient feels that her memory has remained same. She states that she is able to do simple tasks but things that require more complex thinking she has difficulty with. For example she has trouble following plots in a TV show. She states that she has directions for how to make coffee posted. She is able to complete all ADLs independently. She operates a Teacher, music without difficulty. She states that occasionally she'll take a wrong turn to a familiar place. She continues to take Aricept 10 mg daily. Patient states that she did have a sleep study area and she is unsure if she should be wearing oxygen at bedtime. She states that she had a dental procedure and the technician noted that her oxygen saturation dropped while she was sleeping. She denies any new neurological issues. She returns today for an evaluation.  HISTORY 05/18/14 (CD): [name] is here today for her yearly revisit  she is doing remarkably well knowing about her history of metastatic cancer with unknown primary to more she seems to have battled her disease very well.  She has lost some weight since her last visit, her blood pressure is well controlled. The HST study performed on 10-23-13 showed obstructive sleep apnea not to be present, but some hypoxemia at night there were 109 minutes of oxygenation at 88% or less. Since this test was a home sleep test there is a higher likelihood of artifact. The patient does 1-2 times a night feel air hungry. She does wake occasionally up with a feeling that she doesn't breath enough. And her heart will be racing- palpitations have also not been seen in her home sleep test. She described sudden falls still being a concern, neuropathy contributing to this.  her cognition is improved, by her own subjective assessment as well as MOCA test today : scored 26 out of 30 points. This is a normal range for her cognitive function. Ambien dependent , unable to go to sleep without sleep aid for several years now.  Her list of medication was reviewed and there have been no recent changes. Her dog is her main social contact , besides her church family.   Interval history from 11/28/2015. [name] is here today and did excellent on today's memory testing. There is a variability between 26 and 22 points over the last 18 months. She mastered the trail making test, she recalled 3 out of 5 recall words. The patient had been advised in my last  visit with her to avoid driving at night, she lives alone and she relies on her own transportation. She has not gotten lost. She has  an exercise regimen and she has implemented dietary changes. She is an excellent mood today and would like to reduce her Ambien from 12 point 5 at night to 10 mg which makes me very happy. I would like for her to continue with the Aricept I will follow her every 6 months.    REVIEW OF SYSTEMS: Out of a  complete 14 system review of symptoms, the patient complains only of the following symptoms, and all other reviewed systems are negative.  Heat intolerance, excessive sweating, insomnia, daytime sleepiness, joint pain, memory loss, numbness due to neuropathy. Chemotherapy induced, neoplastic induced.   ALLERGIES: Allergies  Allergen Reactions  . Codeine Nausea Only    REACTION: Nausea  . Orange Itching and Rash    HOME MEDICATIONS: Outpatient Prescriptions Prior to Visit  Medication Sig Dispense Refill  . aspirin 81 MG tablet Take 81 mg by mouth daily. 2 TABS IN PM    . butalbital-acetaminophen-caffeine (FIORICET, ESGIC) 50-325-40 MG tablet Take 1 tablet by mouth every 6 (six) hours as needed for headache. 10 tablet 3  . donepezil (ARICEPT) 10 MG tablet Take 1 tablet (10 mg total) by mouth every morning. 90 tablet 2  . DULoxetine (CYMBALTA) 60 MG capsule TAKE ONE CAPSULE BY MOUTH EVERY DAY 30 capsule 0  . ergocalciferol (VITAMIN D2) 50000 UNITS capsule Take 1 capsule (50,000 Units total) by mouth once a week. 8 capsule 3  . LYRICA 150 MG capsule TAKE ONE CAPSULE BY MOUTH TWICE DAILY 60 capsule 5  . Multiple Vitamin (MULTIVITAMIN WITH MINERALS) TABS tablet Take 1 tablet by mouth daily.    . orphenadrine (NORFLEX) 100 MG tablet TAKE 1 TABLET BY MOUTH TWICE DAILY AS NEEDED FOR MUSCLE SPASMS 60 tablet 1  . potassium chloride (K-DUR) 10 MEQ tablet TAKE 2 TABLETS BY MOUTH EVERY DAY 60 tablet 11  . simvastatin (ZOCOR) 40 MG tablet TAKE 1 TABLET BY MOUTH EVERY DAY 30 tablet 11  . triamterene-hydrochlorothiazide (MAXZIDE-25) 37.5-25 MG tablet TAKE 2 TABLETS BY MOUTH DAILY 60 tablet 11  . zolpidem (AMBIEN CR) 12.5 MG CR tablet TAKE 1 TABLET BY MOUTH EVERY NIGHT AT BEDTIME 90 tablet 1   No facility-administered medications prior to visit.    PAST MEDICAL HISTORY: Past Medical History  Diagnosis Date  . Hx pulmonary embolism   . Obesity   . Metrorrhagia   . Allergic rhinitis   . Iron  deficiency anemia   . Hypertension   . Alopecia   . Vitamin D deficiency   . HLD (hyperlipidemia)   . Adjustment disorder   . Anxiety and depression   . Raynaud's syndrome   . Right groin mass   . Adenocarcinoma [facility])     unknown primary probaby ovarian 2009 chemo  . Squamous cell carcinoma of skin   . Ovarian cancer ([place]) 48  . Vision abnormalities   . Neuropathy ([place])   . 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    PAST SURGICAL HISTORY: Past Surgical History  Procedure Laterality Date  . Breast reduction surgery  2000  . Gastric bypass  1979  . Tonsillectomy    . Cesarean section      1991  . Abdominal hysterectomy  05/2008    TAH/BSO  . Colonoscopy  2004; 12/07/10    hemorrhoids    FAMILY HISTORY: Family  History  Problem Relation Age of Onset  . Pulmonary embolism Mother   . Hypertension Father   . Breast cancer Maternal Grandmother     diagnosed in early 61s  . Hyperlipidemia      SOCIAL HISTORY: Social History   Social History  . Marital Status: Divorced    Spouse Name: N/A  . Number of Children: 1  . Years of Education: Master's   Occupational History  .      disabled   Social History Main Topics  . Smoking status: Current Every Day Smoker -- 1.00 packs/day for 37 years    Types: Cigarettes    Start date: 06/16/1978  . Smokeless tobacco: Never Used     Comment: 2- 7-16  STILL SMOKING  . Alcohol Use: 0.0 oz/week    0 Standard drinks or equivalent per week     Comment: rare, 3 weekly  . Drug Use: No  . Sexual Activity: Not on file   Other Topics Concern  . Not on file   Social History Narrative   Divorced   1 son /1 adopted adult son  2 dogs    Sales occupation on disability for now   Regular exercise-yes money stress   Daily caffeine use 2-3 and 2 cups of coffee   On since a security disability since 2012   Patient is right-handed.   Patient has a Master's degree.      PHYSICAL EXAM  Filed  Vitals:   11/28/15 1548  BP: 120/88  Pulse: 86  Resp: 20  Height: 5' 9"  (1.753 m)  Weight: 226 lb (102.513 kg)   Body mass index is 33.36 kg/(m^2).  Generalized: Well developed, in no acute distress, well groomed and happy!    Neurological examination  Mentation: Alert oriented to time, place, history taking. Follows all commands speech and language fluent. MOCA 26/30 Cranial nerve : No change in taste ,but complete loss of smell. Pupils were equal round reactive to light. Extraocular movements were full, visual field were full on confrontational test. Facial sensation and strength were normal. Uvula tongue midline. Head turning and shoulder shrug were normal and symmetric. Bilateral tongue protrusion is strong.   Motor:  5 / 5 strength of all 4 extremities, symmetric motor tone is noted throughout.  Sensory: numbness in feet and calf.  Coordination: Cerebellar testing reveals good finger-nose-finger and heel-to-shin bilaterally.  Gait and station: Gait is normal. Tandem gait is abnormal. Romberg is negative. No drift is seen.  Reflexes: Deep tendon reflexes are symmetric and normal bilaterally.   DIAGNOSTIC DATA (LABS, IMAGING, TESTING) - I reviewed patient records, labs, notes, testing and imaging myself where available.  Lab Results  Component Value Date   WBC 5.2 10/18/2015   HGB 13.8 10/18/2015   HCT 41.5 10/18/2015   MCV 87.1 10/18/2015   PLT 206.0 10/18/2015    Lab Results  Component Value Date   CHOL 159 10/18/2015   HDL 38.90* 10/18/2015   LDLCALC 83 10/18/2015   LDLDIRECT 98.7 09/04/2009   TRIG 187.0* 10/18/2015   CHOLHDL 4 10/18/2015    ASSESSMENT AND PLAN 55 y.o. year old afro american single right handed  female  has a past medical history of pulmonary embolism; Obesity; Metrorrhagia; Allergic rhinitis; Iron deficiency anemia; Hypertension; Alopecia; Vitamin D deficiency; HLD (hyperlipidemia); Adjustment disorder; Anxiety and depression; Raynaud's syndrome;  Right groin mass; Adenocarcinoma ([place]); Squamous cell carcinoma of skin; Ovarian cancer ([place]) (48); Vision abnormalities; Neuropathy ([place]); BRCA1 positive (01/27/2013); and Ovarian cancer  genetic susceptibility (01/27/2013). here with:  1. 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  2. Cognitive changes in patient with ovarian cancer and long term chemotherapy - MOCA now up 25-30 from 22-30,   Limited driving at night- should not drive at night. Needs to have home alarm system.  3. Neuropathy, chemotherapy induced. Dysautonomia, syncopal events.  4. Exercise induced rhinitis? Sneezing and running- not sure that this is not allergic induced.  5. headaches , those have been rare after hysterectomy, but now returned , migrainous character - but sleep study did not show hypoxemia.  Sometimes she will wake up with a headache, sometimes the headache will wake her -episodic cluster headache.  Due to her cognitive problems I would not recommend to use topiramate and with her history of ovarian cancer I am afraid of burdening her  liver metabolism. Her last comprehensive metabolic panel however was excellent and I would consider using a migraine prevention with a beta blocker.She is reluctant due to her history of presyncopal events.  Midodrine?   She will continue taking Ambien for insomnia.I will refill this today for 90 days and 1 refill. She is using 10 mg nightly  Patient's memory has been varied, she scored 25-30 MOCA. We will continue to monitor her memory. She should continue Aricept 10 mg daily.  Patient's neuropathy has remained stable. She continues on Lyrica 150 mg twice a day.  She did have a fall in 2016 due to numbness in the right leg.  She continues to exercise at [employer],  She states that this numbness has not reoccurred and I noted her balance has improved with exercise. . We will continue to monitor this.      Alayjah Boehringer, MD   11/28/2015, 4:08 PM [facility] [address], [place], [place] [address] [phone]

## 2015-11-29 ENCOUNTER — Ambulatory Visit (HOSPITAL_BASED_OUTPATIENT_CLINIC_OR_DEPARTMENT_OTHER): Payer: Medicare Other

## 2015-11-29 DIAGNOSIS — Z8589 Personal history of malignant neoplasm of other organs and systems: Secondary | ICD-10-CM

## 2015-11-29 DIAGNOSIS — Z95828 Presence of other vascular implants and grafts: Secondary | ICD-10-CM | POA: Insufficient documentation

## 2015-11-29 DIAGNOSIS — Z452 Encounter for adjustment and management of vascular access device: Secondary | ICD-10-CM

## 2015-11-29 MED ORDER — HEPARIN SOD (PORK) LOCK FLUSH 100 UNIT/ML IV SOLN
500.0000 [IU] | Freq: Once | INTRAVENOUS | Status: AC | PRN
  Administered 2015-11-29: 500 [IU] via INTRAVENOUS
  Filled 2015-11-29: qty 5

## 2015-11-29 MED ORDER — SODIUM CHLORIDE 0.9 % IJ SOLN
10.0000 mL | INTRAMUSCULAR | Status: DC | PRN
  Administered 2015-11-29: 10 mL via INTRAVENOUS
  Filled 2015-11-29: qty 10

## 2015-11-29 NOTE — Patient Instructions (Signed)

## 2015-12-11 ENCOUNTER — Ambulatory Visit (INDEPENDENT_AMBULATORY_CARE_PROVIDER_SITE_OTHER): Payer: Medicare Other | Admitting: Internal Medicine

## 2015-12-11 ENCOUNTER — Encounter: Payer: Self-pay | Admitting: Internal Medicine

## 2015-12-11 VITALS — BP 120/84 | Temp 98.2°F | Wt 232.8 lb

## 2015-12-11 DIAGNOSIS — R030 Elevated blood-pressure reading, without diagnosis of hypertension: Secondary | ICD-10-CM | POA: Diagnosis not present

## 2015-12-11 DIAGNOSIS — G620 Drug-induced polyneuropathy: Secondary | ICD-10-CM | POA: Diagnosis not present

## 2015-12-11 DIAGNOSIS — R42 Dizziness and giddiness: Secondary | ICD-10-CM

## 2015-12-11 DIAGNOSIS — F17200 Nicotine dependence, unspecified, uncomplicated: Secondary | ICD-10-CM

## 2015-12-11 DIAGNOSIS — T451X5A Adverse effect of antineoplastic and immunosuppressive drugs, initial encounter: Secondary | ICD-10-CM

## 2015-12-11 DIAGNOSIS — Z9181 History of falling: Secondary | ICD-10-CM

## 2015-12-11 DIAGNOSIS — Z79899 Other long term (current) drug therapy: Secondary | ICD-10-CM | POA: Diagnosis not present

## 2015-12-11 DIAGNOSIS — IMO0001 Reserved for inherently not codable concepts without codable children: Secondary | ICD-10-CM

## 2015-12-11 NOTE — Patient Instructions (Addendum)
   You are on a number of medications that can cause dizziness.    Your bp is good today .   3 pills of diuretic is more likely to cause side effects than  be helpful and would not advise this ling term  rx for HT.   I would have you limit avoid the muscle relaxant .    BP in office is good .     Take blood pressure readings twice a day for 7- 10 days and then periodically .To ensure below 140/90   .Send in readings       Can come back and  Let us check our readings with your machine  .   can add flonase  nasacort for congestion ear pressure

## 2015-12-11 NOTE — Progress Notes (Signed)
Pre visit review using our clinic review tool, if applicable. No additional management support is needed unless otherwise documented below in the visit note.  Chief Complaint  Patient presents with  . Dizziness    HPI: [name] 55 y.o.  sda  Had dizziness about 3-4 days eago thhwen awaoke feels off balance no falling or pain or vision change   bp was  151/107 and took extra diuretic pills   And asa  Ears feel full but ? Not roaring or tinnitus that is new  No new  Neuro sx  She has neuroptahy and cognitive  Decrease since her rx fro cancer .  To [place] in next few weeks  Also  ROS: See pertinent positives and negatives per HPI.  Past Medical History:  Diagnosis Date  . 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 Adjustment disorder   . Allergic rhinitis   . Alopecia   . Anxiety and depression   . 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 HLD (hyperlipidemia)   . Hx pulmonary embolism   . Hypertension   . Iron deficiency anemia   . Metrorrhagia   . Neuropathy ([place])   . Obesity   . Ovarian cancer ([place]) 48  . 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 Raynaud's syndrome   . Right groin mass   . Squamous cell carcinoma of skin   . Vision abnormalities   . Vitamin D deficiency     Family History  Problem Relation Age of Onset  . Pulmonary embolism Mother   . Hypertension Father   . Breast cancer Maternal Grandmother     diagnosed in early 91s  . Hyperlipidemia      Social History   Social History  . Marital status: Divorced    Spouse name: N/A  . Number of children: 1  . Years of education: Master's   Occupational History  .  Unemployed    disabled   Social History Main Topics  . Smoking status: Current Every Day Smoker    Packs/day: 1.00    Years: 37.00    Types: Cigarettes    Start date: 06/16/1978  . Smokeless tobacco: Never Used     Comment: 2- 7-16  STILL SMOKING  . Alcohol use 0.0 oz/week     Comment: rare, 3  weekly  . Drug use: No  . Sexual activity: Not Asked   Other Topics Concern  . None   Social History Narrative   Divorced   1 son /1 adopted adult son  2 dogs    Sales occupation on disability for now   Regular exercise-yes money stress   Daily caffeine use 2-3 and 2 cups of coffee   On since a security disability since 2012   Patient is right-handed.   Patient has a Master's degree.    Outpatient Medications Prior to Visit  Medication Sig Dispense Refill  . aspirin 81 MG tablet Take 81 mg by mouth daily. 2 TABS IN PM    . butalbital-acetaminophen-caffeine (FIORICET, ESGIC) 50-325-40 MG tablet Take 1 tablet by mouth every 6 (six) hours as needed for headache. 10 tablet 3  . donepezil (ARICEPT) 10 MG tablet Take 1 tablet (10 mg total) by mouth every morning. 90 tablet 2  . DULoxetine (CYMBALTA) 60 MG capsule TAKE ONE CAPSULE BY MOUTH EVERY DAY 30 capsule 0  . ergocalciferol (VITAMIN D2) 50000 UNITS capsule Take 1 capsule (50,000 Units total) by mouth once a week.  8 capsule 3  . LYRICA 150 MG capsule TAKE ONE CAPSULE BY MOUTH TWICE DAILY 60 capsule 5  . Multiple Vitamin (MULTIVITAMIN WITH MINERALS) TABS tablet Take 1 tablet by mouth daily.    . orphenadrine (NORFLEX) 100 MG tablet TAKE 1 TABLET BY MOUTH TWICE DAILY AS NEEDED FOR MUSCLE SPASMS 60 tablet 1  . potassium chloride (K-DUR) 10 MEQ tablet TAKE 2 TABLETS BY MOUTH EVERY DAY 60 tablet 11  . simvastatin (ZOCOR) 40 MG tablet TAKE 1 TABLET BY MOUTH EVERY DAY 30 tablet 11  . triamterene-hydrochlorothiazide (MAXZIDE-25) 37.5-25 MG tablet TAKE 2 TABLETS BY MOUTH DAILY 60 tablet 11  . zolpidem (AMBIEN) 10 MG tablet Take 1 tablet (10 mg total) by mouth at bedtime as needed for sleep. 30 tablet 2   No facility-administered medications prior to visit.      EXAM:  BP 120/84 (BP Location: Right Arm, Patient Position: Sitting, Cuff Size: Large)   Temp 98.2 F (36.8 C) (Oral)   Wt 232 lb 12.8 oz (105.6 kg)   BMI 34.38 kg/m   Body  mass index is 34.38 kg/m.  GENERAL: vitals reviewed and listed above, alert, oriented, appears well hydrated and in no acute distress looks well  Baker Janus seems steady neg rhoingerg   No focal motor changes  HEENT: atraumatic, conjunctiva  clear, no obvious abnormalities on inspection of external nose and ears tmx nad nase mild congestion  OP : no lesion edema or exudate  NECK: no obvious masses on inspection palpation  LUNGS: clear to auscultation bilaterally, no wheezes, rales or rhonchi, CV: HRRR, no clubbing cyanosis or  peripheral edema nl cap refill  MS: moves all extremities without noticeable focal  abnormality PSYCH: pleasant and cooperative, no obvious depression or anxiety Lab Results  Component Value Date   WBC 5.2 10/18/2015   HGB 13.8 10/18/2015   HCT 41.5 10/18/2015   PLT 206.0 10/18/2015   GLUCOSE 87 10/18/2015   CHOL 159 10/18/2015   TRIG 187.0 (H) 10/18/2015   HDL 38.90 (L) 10/18/2015   LDLDIRECT 98.7 09/04/2009   LDLCALC 83 10/18/2015   ALT 16 10/18/2015   AST 18 10/18/2015   NA 140 10/18/2015   K 3.8 10/18/2015   CL 104 10/18/2015   CREATININE 0.99 10/18/2015   BUN 26 (H) 10/18/2015   CO2 28 10/18/2015   TSH 3.31 10/18/2015   INR 0.86 06/02/2013    ASSESSMENT AND PLAN:  Discussed the following assessment and plan:  Dizziness and giddiness - better  ? if bestibular   vs other and meds   Medication management  Elevated BP  Neuropathy due to chemotherapeutic drug ([place])  Hx of falling  TOBACCO USE Patient feels better today   bp monitor seems discrepant runs higher than our readings  Caution to avoid  Hypotension also  Multiples   ? From med effect ?  congestion eustachian tube dysfunction doesn't seem like bpv today   To fu if deteriorating she says is a lot better than at onset   And exam reassuring at this time  -Patient advised to return or notify health care team  if symptoms worsen ,persist or new concerns arise. patient cam e back and her bp  monitor and ours with cma and my readings are different about 10 - 15  ( hers higher)  Patient Instructions    You are on a number of medications that can cause dizziness.    Your bp is good today .   3 pills of  diuretic is more likely to cause side effects than  be helpful and would not advise this ling term  rx for HT.   I would have you limit avoid the muscle relaxant .    BP in office is good .     Take blood pressure readings twice a day for 7- 10 days and then periodically .To ensure below 140/90   .Send in readings       Can come back and  Let us check our readings with your machine  .   can add flonase  nasacort for congestion ear pressure        Mariann Laster K. Narcisa Ganesh M.D.

## 2015-12-16 ENCOUNTER — Other Ambulatory Visit: Payer: Self-pay | Admitting: Hematology & Oncology

## 2015-12-16 DIAGNOSIS — E559 Vitamin D deficiency, unspecified: Secondary | ICD-10-CM

## 2015-12-18 DIAGNOSIS — H524 Presbyopia: Secondary | ICD-10-CM | POA: Diagnosis not present

## 2015-12-18 DIAGNOSIS — H5213 Myopia, bilateral: Secondary | ICD-10-CM | POA: Diagnosis not present

## 2015-12-18 DIAGNOSIS — H18413 Arcus senilis, bilateral: Secondary | ICD-10-CM | POA: Diagnosis not present

## 2015-12-18 DIAGNOSIS — H52223 Regular astigmatism, bilateral: Secondary | ICD-10-CM | POA: Diagnosis not present

## 2015-12-20 ENCOUNTER — Other Ambulatory Visit: Payer: Self-pay

## 2015-12-20 DIAGNOSIS — G4701 Insomnia due to medical condition: Secondary | ICD-10-CM

## 2015-12-20 DIAGNOSIS — G43109 Migraine with aura, not intractable, without status migrainosus: Secondary | ICD-10-CM

## 2015-12-20 DIAGNOSIS — H53143 Visual discomfort, bilateral: Secondary | ICD-10-CM

## 2015-12-20 DIAGNOSIS — H8113 Benign paroxysmal vertigo, bilateral: Secondary | ICD-10-CM

## 2015-12-20 MED ORDER — BUTALBITAL-APAP-CAFFEINE 50-325-40 MG PO TABS: 1.0000 | ORAL_TABLET | Freq: Four times a day (QID) | ORAL | 3 refills | Status: DC | PRN

## 2015-12-20 NOTE — Telephone Encounter (Signed)
RX for fioricet faxed to [facility] on [address]. Received a receipt of confirmation.

## 2015-12-27 ENCOUNTER — Ambulatory Visit: Payer: Medicare Other

## 2015-12-27 ENCOUNTER — Other Ambulatory Visit (HOSPITAL_BASED_OUTPATIENT_CLINIC_OR_DEPARTMENT_OTHER): Payer: Medicare Other

## 2015-12-27 ENCOUNTER — Ambulatory Visit (HOSPITAL_BASED_OUTPATIENT_CLINIC_OR_DEPARTMENT_OTHER): Payer: Medicare Other | Admitting: Hematology & Oncology

## 2015-12-27 VITALS — BP 122/88 | HR 80 | Temp 97.8°F | Resp 16 | Wt 236.8 lb

## 2015-12-27 DIAGNOSIS — R748 Abnormal levels of other serum enzymes: Secondary | ICD-10-CM | POA: Diagnosis not present

## 2015-12-27 DIAGNOSIS — E039 Hypothyroidism, unspecified: Secondary | ICD-10-CM | POA: Diagnosis not present

## 2015-12-27 DIAGNOSIS — C561 Malignant neoplasm of right ovary: Secondary | ICD-10-CM

## 2015-12-27 DIAGNOSIS — Z8589 Personal history of malignant neoplasm of other organs and systems: Secondary | ICD-10-CM

## 2015-12-27 DIAGNOSIS — G63 Polyneuropathy in diseases classified elsewhere: Principal | ICD-10-CM

## 2015-12-27 DIAGNOSIS — R519 Headache, unspecified: Secondary | ICD-10-CM

## 2015-12-27 DIAGNOSIS — R51 Headache: Secondary | ICD-10-CM | POA: Diagnosis not present

## 2015-12-27 DIAGNOSIS — D508 Other iron deficiency anemias: Secondary | ICD-10-CM

## 2015-12-27 DIAGNOSIS — C801 Malignant (primary) neoplasm, unspecified: Secondary | ICD-10-CM

## 2015-12-27 DIAGNOSIS — Z8543 Personal history of malignant neoplasm of ovary: Secondary | ICD-10-CM

## 2015-12-27 LAB — CBC WITH DIFFERENTIAL (CANCER CENTER ONLY)
BASO#: 0 10*3/uL (ref 0.0–0.2)
BASO%: 0.2 % (ref 0.0–2.0)
EOS ABS: 0.1 10*3/uL (ref 0.0–0.5)
EOS%: 1.2 % (ref 0.0–7.0)
HEMATOCRIT: 40.7 % (ref 34.8–46.6)
HGB: 13.9 g/dL (ref 11.6–15.9)
LYMPH#: 1.7 10*3/uL (ref 0.9–3.3)
LYMPH%: 20.5 % (ref 14.0–48.0)
MCH: 29.8 pg (ref 26.0–34.0)
MCHC: 34.2 g/dL (ref 32.0–36.0)
MCV: 87 fL (ref 81–101)
MONO#: 0.5 10*3/uL (ref 0.1–0.9)
MONO%: 5.6 % (ref 0.0–13.0)
NEUT#: 6 10*3/uL (ref 1.5–6.5)
NEUT%: 72.5 % (ref 39.6–80.0)
PLATELETS: 194 10*3/uL (ref 145–400)
RBC: 4.66 10*6/uL (ref 3.70–5.32)
RDW: 15.6 % (ref 11.1–15.7)
WBC: 8.2 10*3/uL (ref 3.9–10.0)

## 2015-12-27 LAB — COMPREHENSIVE METABOLIC PANEL
ALT: 28 U/L (ref 0–55)
ANION GAP: 10 meq/L (ref 3–11)
AST: 29 U/L (ref 5–34)
Albumin: 3.5 g/dL (ref 3.5–5.0)
Alkaline Phosphatase: 106 U/L (ref 40–150)
BUN: 14.9 mg/dL (ref 7.0–26.0)
CHLORIDE: 102 meq/L (ref 98–109)
CO2: 27 meq/L (ref 22–29)
Calcium: 9.7 mg/dL (ref 8.4–10.4)
Creatinine: 0.9 mg/dL (ref 0.6–1.1)
EGFR: 81 mL/min/{1.73_m2} — AB (ref >90)
Glucose: 74 mg/dl (ref 70–140)
Potassium: 3.9 mEq/L (ref 3.5–5.1)
Sodium: 139 mEq/L (ref 136–145)
Total Bilirubin: 0.33 mg/dL (ref 0.20–1.20)
Total Protein: 6.8 g/dL (ref 6.4–8.3)

## 2015-12-27 LAB — LACTATE DEHYDROGENASE: LDH: 217 U/L (ref 125–245)

## 2015-12-27 MED ORDER — HEPARIN SOD (PORK) LOCK FLUSH 100 UNIT/ML IV SOLN
500.0000 [IU] | Freq: Once | INTRAVENOUS | Status: AC
  Administered 2015-12-27: 500 [IU] via INTRAVENOUS
  Filled 2015-12-27: qty 5

## 2015-12-27 MED ORDER — SODIUM CHLORIDE 0.9% FLUSH
10.0000 mL | INTRAVENOUS | Status: DC | PRN
  Administered 2015-12-27: 10 mL via INTRAVENOUS
  Filled 2015-12-27: qty 10

## 2015-12-27 NOTE — Progress Notes (Signed)
[name] presented for Portacath access and flush. Proper placement of portacath confirmed by CXR. Portacath located in the right chest wall accessed with  H 20 needle. Clean, Dry and Intact Good blood return present. Portacath flushed with 74m NS and 500U/516mHeparin per protocol and needle removed intact. Procedure without incident. Patient tolerated procedure well.

## 2015-12-27 NOTE — Progress Notes (Signed)
Hematology and Oncology Follow Up Visit  [name] [id number] 1961/04/14 55 y.o. 12/27/2015   Principle Diagnosis:  1. 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 2. BRCA1 positive. 3. Severe chemotherapy-induced neuropathy. 4. Intermittent iron-deficiency anemia.  Current Therapy:    Observation     Interim History:  Ms.  [name] is back for followup. We last saw [name] back in February. Since then, she's been doing okay. She's been going out to [place] every other month. She has a son who lives out there.   She will be going to [place] in September. She will be gone for 3 weeks. She will be going to 3 different cities.   Overall, she is doing pretty well. She's had no change in the neuropathy. He's had no change in memory issues. Overall she seems be functioning pretty well.   She's gained weight. She says she is exercising. His heart assay how much she is really exercising.  She has had no bleeding or bruising. She's had no rashes. She's had no leg swelling.   Overall, [name] performance status is ECOG 1.  Medications:  Current Outpatient Prescriptions:  .  aspirin 81 MG tablet, Take 81 mg by mouth daily. 2 TABS IN PM, Disp: , Rfl:  .  butalbital-acetaminophen-caffeine (FIORICET, ESGIC) 50-325-40 MG tablet, Take 1 tablet by mouth every 6 (six) hours as needed for headache., Disp: 10 tablet, Rfl: 3 .  donepezil (ARICEPT) 10 MG tablet, Take 1 tablet (10 mg total) by mouth every morning., Disp: 90 tablet, Rfl: 2 .  DULoxetine (CYMBALTA) 60 MG capsule, TAKE ONE CAPSULE BY MOUTH EVERY DAY, Disp: 30 capsule, Rfl: 0 .  LYRICA 150 MG capsule, TAKE ONE CAPSULE BY MOUTH TWICE DAILY, Disp: 60 capsule, Rfl: 5 .  Multiple Vitamin (MULTIVITAMIN WITH MINERALS) TABS tablet, Take 1 tablet by mouth daily., Disp: , Rfl:  .  orphenadrine (NORFLEX) 100 MG tablet, TAKE 1 TABLET BY MOUTH TWICE DAILY AS NEEDED FOR MUSCLE SPASMS, Disp: 60 tablet, Rfl: 1 .  potassium chloride (K-DUR)  10 MEQ tablet, TAKE 2 TABLETS BY MOUTH EVERY DAY, Disp: 60 tablet, Rfl: 11 .  simvastatin (ZOCOR) 40 MG tablet, TAKE 1 TABLET BY MOUTH EVERY DAY, Disp: 30 tablet, Rfl: 11 .  triamterene-hydrochlorothiazide (MAXZIDE-25) 37.5-25 MG tablet, TAKE 2 TABLETS BY MOUTH DAILY, Disp: 60 tablet, Rfl: 11 .  Vitamin D, Ergocalciferol, (DRISDOL) 50000 units CAPS capsule, TAKE 1 CAPSULE BY MOUTH ONCE A WEEK, Disp: 8 capsule, Rfl: 0 .  zolpidem (AMBIEN) 10 MG tablet, Take 1 tablet (10 mg total) by mouth at bedtime as needed for sleep., Disp: 30 tablet, Rfl: 2 No current facility-administered medications for this visit.   Facility-Administered Medications Ordered in Other Visits:  .  sodium chloride flush (NS) 0.9 % injection 10 mL, 10 mL, Intravenous, PRN, [name], MD, 10 mL at 12/27/15 1244  Allergies:  Allergies  Allergen Reactions  . Codeine Nausea Only    REACTION: Nausea  . Orange Itching and Rash    Past Medical History, Surgical history, Social history, and Family History were reviewed and updated.  Review of Systems: As above  Physical Exam:  weight is 236 lb 12.8 oz (107.4 kg). [name] oral temperature is 97.8 F (36.6 C). [name] blood pressure is 122/88 and [name] pulse is 80. [name] respiration is 16.   Obese African-American female in no obvious distress. Head and neck exam shows no ocular or oral lesions. Shows no pulsatile cervical or supraclavicular lymph nodes. Lungs are clear. Cardiac  exam regular rate and rhythm with no murmurs rubs or bruits. Abdomen is soft with good bowel sounds. There is no fluid wave. There is no palpable liver or spleen tip. She has well-healed laparotomy scar. Back exam no tenderness over the spine ribs or hips. Extremities shows no clubbing cyanosis or edema. Neurological exam shows no focal neurological deficits. She does have some increased sensitivity to touch in [name] feet. Skin exam no rashes.  Lab Results  Component Value Date   WBC 8.2 12/27/2015   HGB 13.9  12/27/2015   HCT 40.7 12/27/2015   MCV 87 12/27/2015   PLT 194 12/27/2015     Chemistry      Component Value Date/Time   NA 140 10/18/2015 1112   NA 140 06/19/2015 1207   NA 141 02/14/2015 1053   K 3.8 10/18/2015 1112   K 3.5 06/19/2015 1207   K 3.9 02/14/2015 1053   CL 104 10/18/2015 1112   CL 104 06/19/2015 1207   CO2 28 10/18/2015 1112   CO2 27 06/19/2015 1207   CO2 27 02/14/2015 1053   BUN 26 (H) 10/18/2015 1112   BUN 24 (H) 06/19/2015 1207   BUN 20.9 02/14/2015 1053   CREATININE 0.99 10/18/2015 1112   CREATININE 1.0 06/19/2015 1207   CREATININE 1.0 02/14/2015 1053      Component Value Date/Time   CALCIUM 9.6 10/18/2015 1112   CALCIUM 9.9 06/19/2015 1207   CALCIUM 9.6 02/14/2015 1053   ALKPHOS 91 10/18/2015 1112   ALKPHOS 104 (H) 06/19/2015 1207   ALKPHOS 89 02/14/2015 1053   AST 18 10/18/2015 1112   AST 26 06/19/2015 1207   AST 24 02/14/2015 1053   ALT 16 10/18/2015 1112   ALT 26 06/19/2015 1207   ALT 24 02/14/2015 1053   BILITOT 0.5 10/18/2015 1112   BILITOT 0.50 06/19/2015 1207   BILITOT 0.39 02/14/2015 1053         Impression and Plan: [name] is 55 year old African American female with a history of a poorly differentiated carcinoma of the abdomen. I did do molecular assay studies on this. It was felt to be ovarian cancer in origin.  She is BRCA1 positive. She is in remission. She had 6 cycles of chemotherapy with carboplatinum and Taxotere.. She's been in remission now for over 6 years.  She did develop neuropathy from the chemotherapy. She also probably has developed some memory issues.  She does have a Port-A-Cath in. I'll have to make sure that we flush the Port-A-Cath.  I will plan to see [name] back myself in another 6 months. She'll have [name] Port-A-Cath flushed every 2 months.  [name], MD 8/16/20173:33 PM

## 2015-12-27 NOTE — Patient Instructions (Signed)

## 2015-12-28 ENCOUNTER — Encounter: Payer: Self-pay | Admitting: *Deleted

## 2015-12-28 LAB — FERRITIN: Ferritin: 35 ng/ml (ref 9–269)

## 2015-12-28 LAB — TSH: TSH: 2.925 m[IU]/L (ref 0.308–3.960)

## 2015-12-28 LAB — IRON AND TIBC
%SAT: 32 % (ref 21–57)
IRON: 98 ug/dL (ref 41–142)
TIBC: 305 ug/dL (ref 236–444)
UIBC: 207 ug/dL (ref 120–384)

## 2015-12-28 LAB — VITAMIN D 25 HYDROXY (VIT D DEFICIENCY, FRACTURES): Vitamin D, 25-Hydroxy: 28.5 ng/mL — ABNORMAL LOW (ref 30.0–100.0)

## 2016-01-01 ENCOUNTER — Encounter: Payer: Self-pay | Admitting: *Deleted

## 2016-01-01 ENCOUNTER — Telehealth: Payer: Self-pay

## 2016-01-01 NOTE — Telephone Encounter (Signed)
I received a mychart request for this pt requesting to be scheduled with Dr. Brett Fairy. Pt says that she sent this message on accident- she really wanted to be scheduled with her PCP and will call that office. Pt had no questions at this time but was encouraged to call back if questions arise.

## 2016-01-05 ENCOUNTER — Telehealth: Payer: Self-pay | Admitting: *Deleted

## 2016-01-05 NOTE — Telephone Encounter (Signed)
Per Claiborne Billings from the [facility] office, I have scheduled flush apapts for the patient. I have called and gave Claiborne Billings the appts. She will call the patient.

## 2016-01-11 ENCOUNTER — Other Ambulatory Visit: Payer: Self-pay | Admitting: Internal Medicine

## 2016-01-11 ENCOUNTER — Other Ambulatory Visit: Payer: Self-pay | Admitting: Hematology & Oncology

## 2016-01-11 ENCOUNTER — Telehealth: Payer: Self-pay | Admitting: Internal Medicine

## 2016-01-11 NOTE — Telephone Encounter (Signed)
° °  Pt ask if the following med can be filled early. She is going out of the country for 3 weeks   LYRICA 150 MG capsule.   req refill on these  orphenadrine (NORFLEX) 100 MG tablet  simvastatin (ZOCOR) 40 MG tablet       [facility] at [address]

## 2016-01-12 MED ORDER — ORPHENADRINE CITRATE ER 100 MG PO TB12: ORAL_TABLET | ORAL | 1 refills | Status: DC

## 2016-01-12 MED ORDER — SIMVASTATIN 40 MG PO TABS: 40.0000 mg | ORAL_TABLET | Freq: Every day | ORAL | 2 refills | Status: DC

## 2016-01-12 NOTE — Telephone Encounter (Signed)
Per Dr. Sarajane Jews, okay to refill Lyrica early for trip.  Simvastatin and orphenadrine sent in by e-scribe (no refills on file per pharmacy).  Pharmacy is unable to refill Lyrica until 01/18/16 per insurance.  Pt will have to pay out of pocket if she wants an early refill.  Tried to reach the pt to inform her.  Was able to leave a message for a return call.

## 2016-01-12 NOTE — Telephone Encounter (Signed)
Spoke to the pt.  Advised that I have given authorization for early refill but insurance will not pay.  Pt stated that she has called her insurance and they will pay.  She is now going to call the pharmacy.  Advised her to call back if I could be of any further assistance.

## 2016-02-10 ENCOUNTER — Other Ambulatory Visit: Payer: Self-pay | Admitting: Hematology & Oncology

## 2016-02-13 ENCOUNTER — Ambulatory Visit
Admission: RE | Admit: 2016-02-13 | Discharge: 2016-02-13 | Disposition: A | Discharge status: Home / Self Care | Payer: Medicare Other | Source: Ambulatory Visit

## 2016-02-13 ENCOUNTER — Other Ambulatory Visit: Payer: Self-pay | Admitting: Internal Medicine

## 2016-02-13 DIAGNOSIS — Z1231 Encounter for screening mammogram for malignant neoplasm of breast: Secondary | ICD-10-CM

## 2016-02-21 ENCOUNTER — Ambulatory Visit (HOSPITAL_BASED_OUTPATIENT_CLINIC_OR_DEPARTMENT_OTHER): Payer: Medicare Other

## 2016-02-21 DIAGNOSIS — Z95828 Presence of other vascular implants and grafts: Secondary | ICD-10-CM

## 2016-02-21 DIAGNOSIS — C801 Malignant (primary) neoplasm, unspecified: Secondary | ICD-10-CM

## 2016-02-21 DIAGNOSIS — Z8589 Personal history of malignant neoplasm of other organs and systems: Secondary | ICD-10-CM | POA: Diagnosis not present

## 2016-02-21 DIAGNOSIS — G63 Polyneuropathy in diseases classified elsewhere: Secondary | ICD-10-CM

## 2016-02-21 DIAGNOSIS — Z452 Encounter for adjustment and management of vascular access device: Secondary | ICD-10-CM

## 2016-02-21 MED ORDER — SODIUM CHLORIDE 0.9 % IJ SOLN
10.0000 mL | INTRAMUSCULAR | Status: DC | PRN
  Administered 2016-02-21: 10 mL via INTRAVENOUS
  Filled 2016-02-21: qty 10

## 2016-02-21 MED ORDER — HEPARIN SOD (PORK) LOCK FLUSH 100 UNIT/ML IV SOLN
500.0000 [IU] | Freq: Once | INTRAVENOUS | Status: AC | PRN
  Administered 2016-02-21: 500 [IU] via INTRAVENOUS
  Filled 2016-02-21: qty 5

## 2016-03-09 ENCOUNTER — Other Ambulatory Visit: Payer: Self-pay | Admitting: Neurology

## 2016-03-09 DIAGNOSIS — H8113 Benign paroxysmal vertigo, bilateral: Secondary | ICD-10-CM

## 2016-03-09 DIAGNOSIS — G43109 Migraine with aura, not intractable, without status migrainosus: Secondary | ICD-10-CM

## 2016-03-09 DIAGNOSIS — G4701 Insomnia due to medical condition: Secondary | ICD-10-CM

## 2016-03-09 DIAGNOSIS — H53143 Visual discomfort, bilateral: Secondary | ICD-10-CM

## 2016-03-13 ENCOUNTER — Other Ambulatory Visit: Payer: Self-pay | Admitting: Hematology & Oncology

## 2016-03-13 ENCOUNTER — Other Ambulatory Visit: Payer: Self-pay | Admitting: Internal Medicine

## 2016-03-13 ENCOUNTER — Other Ambulatory Visit: Payer: Self-pay | Admitting: Nurse Practitioner

## 2016-03-13 ENCOUNTER — Other Ambulatory Visit: Payer: Self-pay

## 2016-03-13 MED ORDER — DULOXETINE HCL 60 MG PO CPEP: 60.0000 mg | ORAL_CAPSULE | Freq: Every day | ORAL | 3 refills | Status: DC

## 2016-03-13 MED ORDER — ZOLPIDEM TARTRATE 10 MG PO TABS: 10.0000 mg | ORAL_TABLET | Freq: Every evening | ORAL | 2 refills | Status: DC | PRN

## 2016-03-13 NOTE — Telephone Encounter (Signed)
RX for [employer] faxed to [facility]. Received a receipt of confirmation.

## 2016-04-16 ENCOUNTER — Other Ambulatory Visit: Payer: Self-pay | Admitting: Internal Medicine

## 2016-04-17 ENCOUNTER — Ambulatory Visit (HOSPITAL_BASED_OUTPATIENT_CLINIC_OR_DEPARTMENT_OTHER): Payer: Medicare Other

## 2016-04-17 DIAGNOSIS — Z8589 Personal history of malignant neoplasm of other organs and systems: Secondary | ICD-10-CM

## 2016-04-17 DIAGNOSIS — Z95828 Presence of other vascular implants and grafts: Secondary | ICD-10-CM

## 2016-04-17 DIAGNOSIS — C801 Malignant (primary) neoplasm, unspecified: Secondary | ICD-10-CM

## 2016-04-17 DIAGNOSIS — Z452 Encounter for adjustment and management of vascular access device: Secondary | ICD-10-CM | POA: Diagnosis not present

## 2016-04-17 DIAGNOSIS — G63 Polyneuropathy in diseases classified elsewhere: Secondary | ICD-10-CM

## 2016-04-17 MED ORDER — SODIUM CHLORIDE 0.9 % IJ SOLN
10.0000 mL | INTRAMUSCULAR | Status: DC | PRN
  Administered 2016-04-17: 10 mL via INTRAVENOUS
  Filled 2016-04-17: qty 10

## 2016-04-17 MED ORDER — HEPARIN SOD (PORK) LOCK FLUSH 100 UNIT/ML IV SOLN
500.0000 [IU] | Freq: Once | INTRAVENOUS | Status: AC | PRN
  Administered 2016-04-17: 500 [IU] via INTRAVENOUS
  Filled 2016-04-17: qty 5

## 2016-04-19 NOTE — Telephone Encounter (Signed)
Ok  To refill for 6 months

## 2016-04-22 NOTE — Telephone Encounter (Signed)
Simvastatin sent by e-scribe.  Lyrica faxed and received a confirmation that the fax was successful.

## 2016-05-13 ENCOUNTER — Other Ambulatory Visit: Payer: Self-pay | Admitting: Hematology & Oncology

## 2016-05-15 ENCOUNTER — Other Ambulatory Visit: Payer: Self-pay | Admitting: Internal Medicine

## 2016-05-15 ENCOUNTER — Other Ambulatory Visit: Payer: Self-pay | Admitting: Hematology & Oncology

## 2016-05-17 NOTE — Telephone Encounter (Signed)
Ok x 2  

## 2016-05-17 NOTE — Telephone Encounter (Signed)
Sent to the pharmacy by e-scribe. 

## 2016-05-29 ENCOUNTER — Ambulatory Visit: Payer: Managed Care, Other (non HMO) | Admitting: Adult Health

## 2016-05-31 ENCOUNTER — Telehealth: Payer: Self-pay | Admitting: *Deleted

## 2016-05-31 NOTE — Telephone Encounter (Addendum)
I called pt to reschedule appt from --05-29-16 with MM/NP due to weather and  pt wanted to see Dr. Brett Fairy as she stated it was time for her to have her disabilty p/w redone.  I told her I would send note to [facility].   She verbalized that this was ok.

## 2016-06-03 NOTE — Telephone Encounter (Signed)
I called pt. I offered her an appt on 06/05/16 at 8:30am with Dr. Brett Fairy. Pt accepted this appt and verbalized understanding of appt date and time.

## 2016-06-05 ENCOUNTER — Ambulatory Visit (INDEPENDENT_AMBULATORY_CARE_PROVIDER_SITE_OTHER): Payer: Medicare Other | Admitting: Neurology

## 2016-06-05 ENCOUNTER — Encounter: Payer: Self-pay | Admitting: Neurology

## 2016-06-05 VITALS — BP 131/90 | HR 92 | Resp 20 | Ht 69.0 in | Wt 233.0 lb

## 2016-06-05 DIAGNOSIS — G4701 Insomnia due to medical condition: Secondary | ICD-10-CM | POA: Diagnosis not present

## 2016-06-05 DIAGNOSIS — H53143 Visual discomfort, bilateral: Secondary | ICD-10-CM

## 2016-06-05 DIAGNOSIS — H8113 Benign paroxysmal vertigo, bilateral: Secondary | ICD-10-CM | POA: Diagnosis not present

## 2016-06-05 DIAGNOSIS — R296 Repeated falls: Secondary | ICD-10-CM

## 2016-06-05 DIAGNOSIS — G43109 Migraine with aura, not intractable, without status migrainosus: Secondary | ICD-10-CM

## 2016-06-05 MED ORDER — ZOLPIDEM TARTRATE 10 MG PO TABS: 10.0000 mg | ORAL_TABLET | Freq: Every day | ORAL | 5 refills | Status: DC

## 2016-06-05 MED ORDER — BUTALBITAL-APAP-CAFFEINE 50-325-40 MG PO TABS: 1.0000 | ORAL_TABLET | Freq: Four times a day (QID) | ORAL | 3 refills | Status: DC | PRN

## 2016-06-05 MED ORDER — DONEPEZIL HCL 10 MG PO TABS: 10.0000 mg | ORAL_TABLET | Freq: Every morning | ORAL | 1 refills | Status: DC

## 2016-06-05 NOTE — Progress Notes (Signed)
PATIENT: Taj Arteaga Saxon-Mace DOB: 1961/04/01  REASON FOR VISIT: follow up- neuropathy, cognitive changes, insomnia HISTORY FROM: patient  HISTORY OF PRESENT ILLNESS: [name] is a 56 year old female with a history of neuropathy,cognitive changes and insomnia. She returns today for follow-up. The patient continues to take Ambien for insomnia. The patient states that if she does not take this medication she will not be able to go to sleep. The patient currently takes Lyrica 150 mg twice a day for neuropathy. Patient states that the burning and tingling is located in the toes extending to the mid foot bilaterally. She also has burning and tingling in the fingers that extends to the mid hand. Denies any changes with her gait or balance. That she had a fall while standing and watching fireworks. She states that the right leg went numb and cause her to fall. She states that this is the first time this has happened. She states that the numbness resolved. He states that she is not had any additional episodes of numbness. Patient feels that her memory has remained same. She states that she is able to do simple tasks but things that require more complex thinking she has difficulty with. For example she has trouble following plots in a TV show. She states that she has directions for how to make coffee posted. She is able to complete all ADLs independently. She operates a Teacher, music without difficulty. She states that occasionally she'll take a wrong turn to a familiar place. She continues to take Aricept 10 mg daily. Patient states that she did have a sleep study area and she is unsure if she should be wearing oxygen at bedtime. She states that she had a dental procedure and the technician noted that her oxygen saturation dropped while she was sleeping. She denies any new neurological issues. She returns today for an evaluation.  HISTORY 05/18/14 (CD): [name] is here today for her yearly revisit  she is doing remarkably well knowing about her history of metastatic cancer with unknown primary to more she seems to have battled her disease very well.  She has lost some weight since her last visit, her blood pressure is well controlled. The HST study performed on 10-23-13 showed obstructive sleep apnea not to be present, but some hypoxemia at night there were 109 minutes of oxygenation at 88% or less. Since this test was a home sleep test there is a higher likelihood of artifact. The patient does 1-2 times a night feel air hungry. She does wake occasionally up with a feeling that she doesn't breath enough. And her heart will be racing- palpitations have also not been seen in her home sleep test. She described sudden falls still being a concern, neuropathy contributing to this.  her cognition is improved, by her own subjective assessment as well as MOCA test today : scored 26 out of 30 points. This is a normal range for her cognitive function. Ambien dependent , unable to go to sleep without sleep aid for several years now.  Her list of medication was reviewed and there have been no recent changes. Her dog is her main social contact , besides her church family.   Interval history from 11/28/2015. [name] is here today and did excellent on today's memory testing. There is a variability between 26 and 22 points over the last 18 months. She mastered the trail making test, she recalled 3 out of 5 recall words. The patient had been advised in my last  visit with her to avoid driving at night, she lives alone and she relies on her own transportation. She has not gotten lost. She has  an exercise regimen and she has implemented dietary changes. She is an excellent mood today and would like to reduce her Ambien from 12 point 5 at night to 10 mg which makes me very happy. I would like for her to continue with the Aricept I will follow her every 6 months.  Interval history from 06/05/2016,  [name] at the meanwhile 56 year old woman with a long-standing history for ill-defined consult. She can continue to drive without difficulties and is independent in activities of daily living she reports rhinitis but I attributed to Aricept, falls that seem to be neither syncopal, nor seizure, no mechanical in origin. Her falls are always propulsive, she falls forward she has very little warning or numb and she finds herself dominant how she got to the floor. Her medical test score 25 out of 30 points today which is stable and actually improved to a year ago. She is taking Lyrica for neuropathy and Lyrica is also a seizure medicine, I have wondered if Aricept may give her bradycardia but her heart rate today was 92.    REVIEW OF SYSTEMS: Out of a complete 14 system review of symptoms, the patient complains only of the following symptoms, and all other reviewed systems are negative.  Heat intolerance, excessive sweating, insomnia, daytime sleepiness, joint pain, memory loss, numbness due to neuropathy. Chemotherapy induced, neoplastic induced.   ALLERGIES: Allergies  Allergen Reactions  . Codeine Nausea Only    REACTION: Nausea  . Orange Itching and Rash    HOME MEDICATIONS: Outpatient Medications Prior to Visit  Medication Sig Dispense Refill  . aspirin 81 MG tablet Take 81 mg by mouth daily. 2 TABS IN PM    . butalbital-acetaminophen-caffeine (FIORICET, ESGIC) 50-325-40 MG tablet Take 1 tablet by mouth every 6 (six) hours as needed for headache. 10 tablet 3  . donepezil (ARICEPT) 10 MG tablet TAKE 1 TABLET BY MOUTH EVERY MORNING 90 tablet 0  . DULoxetine (CYMBALTA) 60 MG capsule Take 1 capsule (60 mg total) by mouth daily. 30 capsule 3  . LYRICA 150 MG capsule TAKE 1 CAPSULE BY MOUTH TWICE DAILY 60 capsule 5  . Multiple Vitamin (MULTIVITAMIN WITH MINERALS) TABS tablet Take 1 tablet by mouth daily.    . orphenadrine (NORFLEX) 100 MG tablet TAKE 1 TABLET BY MOUTH TWICE DAILY AS  NEEDED FOR MUSCLE SPASMS 60 tablet 1  . potassium chloride (K-DUR) 10 MEQ tablet TAKE 2 TABLETS BY MOUTH EVERY DAY 60 tablet 11  . simvastatin (ZOCOR) 40 MG tablet TAKE 1 TABLET BY MOUTH DAILY 30 tablet 5  . triamterene-hydrochlorothiazide (MAXZIDE-25) 37.5-25 MG tablet TAKE 2 TABLETS BY MOUTH DAILY 60 tablet 11  . Vitamin D, Ergocalciferol, (DRISDOL) 50000 units CAPS capsule TAKE ONE CAPSULE BY MOUTH EVERY WEEK 12 capsule 0  . DULoxetine (CYMBALTA) 60 MG capsule TAKE ONE CAPSULE BY MOUTH EVERY DAY 30 capsule 0  . Vitamin D, Ergocalciferol, (DRISDOL) 50000 units CAPS capsule TAKE 1 CAPSULE BY MOUTH ONCE A WEEK 8 capsule 0  . zolpidem (AMBIEN) 10 MG tablet Take 1 tablet (10 mg total) by mouth at bedtime as needed for sleep. 30 tablet 2   No facility-administered medications prior to visit.     PAST MEDICAL HISTORY: Past Medical History:  Diagnosis Date  . Adenocarcinoma [facility])    unknown primary probaby ovarian 2009 chemo  .  Adjustment disorder   . Allergic rhinitis   . Alopecia   . Anxiety and depression   . 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 HLD (hyperlipidemia)   . Hx pulmonary embolism   . Hypertension   . Iron deficiency anemia   . Metrorrhagia   . Neuropathy ([place])   . Obesity   . Ovarian cancer ([place]) 48  . 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 Raynaud's syndrome   . Right groin mass   . Squamous cell carcinoma of skin   . Vision abnormalities   . Vitamin D deficiency     PAST SURGICAL HISTORY: Past Surgical History:  Procedure Laterality Date  . ABDOMINAL HYSTERECTOMY  05/2008   TAH/BSO  . BREAST REDUCTION SURGERY  2000  . CESAREAN SECTION     1991  . COLONOSCOPY  2004; 12/07/10   hemorrhoids  . GASTRIC BYPASS  1979  . TONSILLECTOMY      FAMILY HISTORY: Family History  Problem Relation Age of Onset  . Pulmonary embolism Mother   . Hypertension Father   . Breast cancer Maternal Grandmother     diagnosed in early 2s  . Hyperlipidemia      SOCIAL  HISTORY: Social History   Social History  . Marital status: Divorced    Spouse name: N/A  . Number of children: 1  . Years of education: Master's   Occupational History  .  Unemployed    disabled   Social History Main Topics  . Smoking status: Current Every Day Smoker    Packs/day: 1.00    Years: 37.00    Types: Cigarettes    Start date: 06/16/1978  . Smokeless tobacco: Never Used     Comment: 2- 7-16  STILL SMOKING  . Alcohol use 0.0 oz/week     Comment: rare, 3 weekly  . Drug use: No  . Sexual activity: Not on file   Other Topics Concern  . Not on file   Social History Narrative   Divorced   1 son /1 adopted adult son  2 dogs    Sales occupation on disability for now   Regular exercise-yes money stress   Daily caffeine use 2-3 and 2 cups of coffee   On since a security disability since 2012   Patient is right-handed.   Patient has a Master's degree.      PHYSICAL EXAM  Vitals:   06/05/16 0836  BP: 131/90  Pulse: 92  Resp: 20  Weight: 233 lb (105.7 kg)  Height: 5' 9"  (1.753 m)   Body mass index is 34.41 kg/m.  Generalized: Well developed, in no acute distress, well groomed and happy!    Neurological examination  Mentation: Alert oriented to time, place, history taking. Follows all commands speech and language fluent.  Montreal Cognitive Assessment  06/05/2016 11/28/2015 05/31/2015 11/23/2014 11/23/2014  Visuospatial/ Executive (0/5) 4 4 4 4  0  Naming (0/3) 3 3 3 3 3   Attention: Read list of digits (0/2) 1 1 0 2 2  Attention: Read list of letters (0/1) 1 1 1 1 1   Attention: Serial 7 subtraction starting at 100 (0/3) 3 3 1 3  -  Language: Repeat phrase (0/2) 2 2 2 2 2   Language : Fluency (0/1) 1 1 1 1 1   Abstraction (0/2) 2 2 2 2 2   Delayed Recall (0/5) 2 3 2 3 3   Orientation (0/6) 6 5 6 5 3   Total 25 25 22 26  -  Adjusted Score (based on education) 25  25 22 - -   Cranial nerve : No change in taste ,but complete loss of smell. Pupils were equal round  reactive to light. Extraocular movements were full, visual field were full on confrontational test. Facial sensation and strength were normal. Uvula tongue midline. Head turning and shoulder shrug were normal and symmetric. Bilateral tongue protrusion is strong.   Motor:  5 / 5 strength of all 4 extremities, symmetric motor tone is noted throughout.  Sensory: numbness in feet and calf.  Coordination: Cerebellar testing reveals good finger-nose-finger and heel-to-shin bilaterally.  Gait and station: Gait is normal. Tandem gait is abnormal. Romberg is negative !.  Reflexes: Deep tendon reflexes are symmetric bilaterally.    DIAGNOSTIC DATA (LABS, IMAGING, TESTING) - I reviewed patient records, labs, notes, testing and imaging myself where available.  Lab Results  Component Value Date   WBC 8.2 12/27/2015   HGB 13.9 12/27/2015   HCT 40.7 12/27/2015   MCV 87 12/27/2015   PLT 194 12/27/2015    Lab Results  Component Value Date   CHOL 159 10/18/2015   HDL 38.90 (L) 10/18/2015   LDLCALC 83 10/18/2015   LDLDIRECT 98.7 09/04/2009   TRIG 187.0 (H) 10/18/2015   CHOLHDL 4 10/18/2015    ASSESSMENT AND PLAN 56 y.o. year old afro american single right handed  female , here for q 6 month memory testing and refills.     has a past medical history of Adenocarcinoma ([place]); Adjustment disorder; Allergic rhinitis; Alopecia; Anxiety and depression; BRCA1 positive (01/27/2013); HLD (hyperlipidemia); pulmonary embolism; Hypertension; Iron deficiency anemia; Metrorrhagia; Neuropathy ([place]); Obesity; Ovarian cancer ([place]) (48); Ovarian cancer genetic susceptibility (01/27/2013); Raynaud's syndrome; Right groin mass; Squamous cell carcinoma of skin; Vision abnormalities; and Vitamin D deficiency. here with:   Ovarian Cancer diagnosed in 2009 !   1. 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  I have refilled ambien- I do not think it's worth weaning off, given her longstanding use of sleep aids.  2. Cognitive  changes in patient with ovarian cancer and long term chemotherapy - MOCA now up 25-30 from 22-30 in January 2017.    3. Limited driving at night- should not drive at night. Needs to have home alarm system. Fell walking on her neighborhood street 2 weeks ago- not sure if there was a mechanical barrier, stumble - not a fainting- or, at least, she can not recall. It was a dry street in daylight. Fell forward.?related to   Neuropathy, chemotherapy induced. Dysautonomia, syncopal events, presyncope? She fell on her breasts, which were painfully bruised. Has fallen in her house. She is aware of her surroundings, stunned but not confused, not postictal.    4. Sneezing and running- not sure that this is not allergic induced. Can be Aricept induced. aricept can cause bradycardia.  5. headaches , those have been rare after hysterectomy, but now returned , migrainous character - but sleep study did not show hypoxemia.  Sometimes she will wake up with a headache, sometimes the headache will wake her -episodic cluster headache.  Due to her cognitive problems I would not recommend to use topiramate and with her history of ovarian cancer I am afraid of burdening her liver metabolism.  Her last comprehensive metabolic panel however was excellent and I would consider using a migraine prevention with a beta blocker. Her heart rate is high, not slow.  She is reluctant due to her history of presyncopal events. Will keep watching.  She will continue taking Ambien for insomnia.I will refill this  today for 90 days and 1 refill. She is using 10 mg nightly . Patient's memory has been varied, she scored again a  25-30 on  [place] ( same as July 2017) . We will continue to monitor her memory. She should continue Aricept 10 mg daily.  Patient's neuropathy has remained stable. She continues on Lyrica 150 mg twice a day.  She did have a fall in 2016 due to numbness in the right leg, other falls are ill described.   She continues to  exercise at [employer],  She states that this numbness has not reoccurred and I noted her balance has improved with exercise. . We will continue to monitor this.    Will see her in 6 month.Marland Kitchen   Abagayle Klutts, MD   06/05/2016, 8:54 AM [facility] [address], [place], [place] [address] [phone]

## 2016-06-06 ENCOUNTER — Telehealth: Payer: Self-pay

## 2016-06-06 NOTE — Telephone Encounter (Signed)
Pt brought in disability paperwork at her office visit yesterday. Disability paperwork completed and signed by Dr. Brett Fairy and brought to MR to be processed/payment discussed.

## 2016-06-11 ENCOUNTER — Other Ambulatory Visit: Payer: Self-pay | Admitting: Hematology & Oncology

## 2016-06-11 DIAGNOSIS — Z0289 Encounter for other administrative examinations: Secondary | ICD-10-CM

## 2016-06-14 ENCOUNTER — Other Ambulatory Visit: Payer: Self-pay | Admitting: Neurology

## 2016-06-14 DIAGNOSIS — H8113 Benign paroxysmal vertigo, bilateral: Secondary | ICD-10-CM

## 2016-06-14 DIAGNOSIS — G4701 Insomnia due to medical condition: Secondary | ICD-10-CM

## 2016-06-14 DIAGNOSIS — G43109 Migraine with aura, not intractable, without status migrainosus: Secondary | ICD-10-CM

## 2016-06-14 DIAGNOSIS — H53143 Visual discomfort, bilateral: Secondary | ICD-10-CM

## 2016-07-08 ENCOUNTER — Ambulatory Visit (HOSPITAL_BASED_OUTPATIENT_CLINIC_OR_DEPARTMENT_OTHER)
Admission: RE | Admit: 2016-07-08 | Discharge: 2016-07-08 | Disposition: A | Discharge status: Home / Self Care | Payer: Medicare Other | Source: Ambulatory Visit | Attending: Hematology & Oncology | Admitting: Hematology & Oncology

## 2016-07-08 ENCOUNTER — Other Ambulatory Visit (HOSPITAL_BASED_OUTPATIENT_CLINIC_OR_DEPARTMENT_OTHER): Payer: Medicare Other

## 2016-07-08 ENCOUNTER — Ambulatory Visit (HOSPITAL_BASED_OUTPATIENT_CLINIC_OR_DEPARTMENT_OTHER): Payer: Medicare Other | Admitting: Hematology & Oncology

## 2016-07-08 VITALS — BP 136/80 | HR 87 | Temp 98.0°F | Resp 16 | Wt 232.1 lb

## 2016-07-08 DIAGNOSIS — G63 Polyneuropathy in diseases classified elsewhere: Secondary | ICD-10-CM | POA: Diagnosis not present

## 2016-07-08 DIAGNOSIS — Z8589 Personal history of malignant neoplasm of other organs and systems: Secondary | ICD-10-CM | POA: Diagnosis not present

## 2016-07-08 DIAGNOSIS — G8929 Other chronic pain: Secondary | ICD-10-CM | POA: Diagnosis not present

## 2016-07-08 DIAGNOSIS — Z1509 Genetic susceptibility to other malignant neoplasm: Secondary | ICD-10-CM

## 2016-07-08 DIAGNOSIS — D508 Other iron deficiency anemias: Secondary | ICD-10-CM | POA: Insufficient documentation

## 2016-07-08 DIAGNOSIS — C801 Malignant (primary) neoplasm, unspecified: Secondary | ICD-10-CM

## 2016-07-08 DIAGNOSIS — S3992XA Unspecified injury of lower back, initial encounter: Secondary | ICD-10-CM | POA: Diagnosis not present

## 2016-07-08 DIAGNOSIS — Z1502 Genetic susceptibility to malignant neoplasm of ovary: Secondary | ICD-10-CM | POA: Insufficient documentation

## 2016-07-08 DIAGNOSIS — M545 Low back pain, unspecified: Secondary | ICD-10-CM

## 2016-07-08 DIAGNOSIS — E039 Hypothyroidism, unspecified: Secondary | ICD-10-CM | POA: Diagnosis not present

## 2016-07-08 DIAGNOSIS — M5136 Other intervertebral disc degeneration, lumbar region: Secondary | ICD-10-CM | POA: Insufficient documentation

## 2016-07-08 DIAGNOSIS — Z1501 Genetic susceptibility to malignant neoplasm of breast: Secondary | ICD-10-CM

## 2016-07-08 DIAGNOSIS — G629 Polyneuropathy, unspecified: Secondary | ICD-10-CM

## 2016-07-08 DIAGNOSIS — M4186 Other forms of scoliosis, lumbar region: Secondary | ICD-10-CM | POA: Insufficient documentation

## 2016-07-08 DIAGNOSIS — D500 Iron deficiency anemia secondary to blood loss (chronic): Secondary | ICD-10-CM | POA: Diagnosis not present

## 2016-07-08 LAB — CBC WITH DIFFERENTIAL (CANCER CENTER ONLY)
BASO#: 0 10*3/uL (ref 0.0–0.2)
BASO%: 0.2 % (ref 0.0–2.0)
EOS ABS: 0.1 10*3/uL (ref 0.0–0.5)
EOS%: 2 % (ref 0.0–7.0)
HEMATOCRIT: 40.7 % (ref 34.8–46.6)
HGB: 13.9 g/dL (ref 11.6–15.9)
LYMPH#: 1.7 10*3/uL (ref 0.9–3.3)
LYMPH%: 31.2 % (ref 14.0–48.0)
MCH: 30.2 pg (ref 26.0–34.0)
MCHC: 34.2 g/dL (ref 32.0–36.0)
MCV: 89 fL (ref 81–101)
MONO#: 0.4 10*3/uL (ref 0.1–0.9)
MONO%: 6.9 % (ref 0.0–13.0)
NEUT#: 3.2 10*3/uL (ref 1.5–6.5)
NEUT%: 59.7 % (ref 39.6–80.0)
Platelets: 214 10*3/uL (ref 145–400)
RBC: 4.6 10*6/uL (ref 3.70–5.32)
RDW: 15 % (ref 11.1–15.7)
WBC: 5.4 10*3/uL (ref 3.9–10.0)

## 2016-07-08 LAB — COMPREHENSIVE METABOLIC PANEL (CC13)
ALBUMIN: 4 g/dL (ref 3.5–5.5)
ALK PHOS: 107 IU/L (ref 39–117)
ALT: 27 IU/L (ref 0–32)
AST: 25 IU/L (ref 0–40)
Albumin/Globulin Ratio: 1.6 (ref 1.2–2.2)
BILIRUBIN TOTAL: 0.2 mg/dL (ref 0.0–1.2)
BUN / CREAT RATIO: 36 — AB (ref 9–23)
BUN: 30 mg/dL — AB (ref 6–24)
CHLORIDE: 100 mmol/L (ref 96–106)
Calcium, Ser: 10.1 mg/dL (ref 8.7–10.2)
Carbon Dioxide, Total: 30 mmol/L — ABNORMAL HIGH (ref 18–29)
Creatinine, Ser: 0.83 mg/dL (ref 0.57–1.00)
GFR calc Af Amer: 92 mL/min/{1.73_m2} (ref >59)
GFR calc non Af Amer: 80 mL/min/{1.73_m2} (ref >59)
GLUCOSE: 105 mg/dL — AB (ref 65–99)
Globulin, Total: 2.5 g/dL (ref 1.5–4.5)
Potassium, Ser: 3.6 mmol/L (ref 3.5–5.2)
Sodium: 135 mmol/L (ref 134–144)
Total Protein: 6.5 g/dL (ref 6.0–8.5)

## 2016-07-08 NOTE — Progress Notes (Signed)
Hematology and Oncology Follow Up Visit  [name] [id number] September 01, 1960 55 y.o. 07/08/2016   Principle Diagnosis:  1. 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 2. BRCA1 positive. 3. Severe chemotherapy-induced neuropathy. 4. Intermittent iron-deficiency anemia.  Current Therapy:    Observation     Interim History:  Ms.  [name] is back for followup. We last saw her back in August. Her main complaint has been lower back pain. This is been getting a little bit worse for the past month or so. It is in the lower back. There may be some radiation to her left lateral hip. There is no weakness in her legs. There is no bowel or bladder incontinence. She's had no abdominal pain. She's had no cough.  She still has the neuropathy. This is been ultimately stable.  She says the back pain is worse when she sits or when she walks. It seems to be a little better when she lies down.  She's had no bleeding. She unfortunately has gained a little bit more weight. She was busy over the holidays. When her back started hurting her, she is not able to work out.  She's had no fever. Thankfully, she is not had influenza or other viral/bacterial infections.  Overall, her performance status is ECOG 1.  Medications:  Current Outpatient Prescriptions:  .  aspirin 81 MG tablet, Take 81 mg by mouth daily. 2 TABS IN PM, Disp: , Rfl:  .  butalbital-acetaminophen-caffeine (FIORICET, ESGIC) 50-325-40 MG tablet, Take 1 tablet by mouth every 6 (six) hours as needed for headache., Disp: 10 tablet, Rfl: 3 .  donepezil (ARICEPT) 10 MG tablet, Take 1 tablet (10 mg total) by mouth every morning., Disp: 90 tablet, Rfl: 1 .  DULoxetine (CYMBALTA) 60 MG capsule, Take 1 capsule (60 mg total) by mouth daily., Disp: 30 capsule, Rfl: 3 .  LYRICA 150 MG capsule, TAKE 1 CAPSULE BY MOUTH TWICE DAILY, Disp: 60 capsule, Rfl: 5 .  Multiple Vitamin (MULTIVITAMIN WITH MINERALS) TABS tablet, Take 1 tablet by mouth  daily., Disp: , Rfl:  .  orphenadrine (NORFLEX) 100 MG tablet, TAKE 1 TABLET BY MOUTH TWICE DAILY AS NEEDED FOR MUSCLE SPASMS, Disp: 60 tablet, Rfl: 1 .  potassium chloride (K-DUR) 10 MEQ tablet, TAKE 2 TABLETS BY MOUTH EVERY DAY, Disp: 60 tablet, Rfl: 11 .  simvastatin (ZOCOR) 40 MG tablet, TAKE 1 TABLET BY MOUTH DAILY, Disp: 30 tablet, Rfl: 5 .  triamterene-hydrochlorothiazide (MAXZIDE-25) 37.5-25 MG tablet, TAKE 2 TABLETS BY MOUTH DAILY, Disp: 60 tablet, Rfl: 11 .  Vitamin D, Ergocalciferol, (DRISDOL) 50000 units CAPS capsule, TAKE ONE CAPSULE BY MOUTH EVERY WEEK, Disp: 12 capsule, Rfl: 0 .  zolpidem (AMBIEN) 10 MG tablet, Take 1 tablet (10 mg total) by mouth at bedtime., Disp: 30 tablet, Rfl: 5  Allergies:  Allergies  Allergen Reactions  . Codeine Nausea Only    REACTION: Nausea  . Orange Itching and Rash    Past Medical History, Surgical history, Social history, and Family History were reviewed and updated.  Review of Systems: As above  Physical Exam:  weight is 232 lb 1.9 oz (105.3 kg). Her oral temperature is 98 F (36.7 C). Her blood pressure is 136/80 and her pulse is 87. Her respiration is 16 and oxygen saturation is 98%.   Obese African-American female in no obvious distress. Head and neck exam shows no ocular or oral lesions. Shows no pulsatile cervical or supraclavicular lymph nodes. Lungs are clear. Cardiac exam regular rate and rhythm with no  murmurs rubs or bruits. Abdomen is soft with good bowel sounds. There is no fluid wave. There is no palpable liver or spleen tip. She has well-healed laparotomy scar. Back examShows some slight tenderness over the lower spine. She has some slight muscle spasms in the lumbar paravertebral region.. Extremities shows no clubbing cyanosis or edema. Neurological exam shows no focal neurological deficits. She does have some increased sensitivity to touch in her feet. Skin exam no rashes.  Lab Results  Component Value Date   WBC 5.4  07/08/2016   HGB 13.9 07/08/2016   HCT 40.7 07/08/2016   MCV 89 07/08/2016   PLT 214 07/08/2016     Chemistry      Component Value Date/Time   NA 135 07/08/2016 1432   NA 139 12/27/2015 1156   K 3.6 07/08/2016 1432   K 3.9 12/27/2015 1156   CL 100 07/08/2016 1432   CL 104 06/19/2015 1207   CO2 30 (H) 07/08/2016 1432   CO2 27 12/27/2015 1156   BUN 30 (H) 07/08/2016 1432   BUN 14.9 12/27/2015 1156   CREATININE 0.83 07/08/2016 1432   CREATININE 0.9 12/27/2015 1156      Component Value Date/Time   CALCIUM 10.1 07/08/2016 1432   CALCIUM 9.7 12/27/2015 1156   ALKPHOS 107 07/08/2016 1432   ALKPHOS 106 12/27/2015 1156   AST 25 07/08/2016 1432   AST 29 12/27/2015 1156   ALT 27 07/08/2016 1432   ALT 28 12/27/2015 1156   BILITOT 0.2 07/08/2016 1432   BILITOT 0.33 12/27/2015 1156         Impression and Plan: [name] is 56 year old African American female with a history of a poorly differentiated carcinoma of the abdomen. I did do molecular assay studies on this. It was felt to be ovarian cancer in origin.  She is BRCA1 positive. She is in remission. She had 6 cycles of chemotherapy with carboplatinum and Taxotere.. She's been in remission now for over 6 years.  She did develop neuropathy from the chemotherapy. She also probably has developed some memory issues.  I'm not sure why she is having the lower back pain. We was have to keep in mind the fact that she has had cancer. This is been about 7 years. However, we cannot overlook the possibility of cancer recurrence.  I did get a lumbar series today. She has some slight degenerative changes. Nothing that would suggest a fracture or osteoporosis or malignancy. We ultimately may have to get an MRI.  I think she will see her orthopedist in a week or so.  She does have a Port-A-Cath in. I'll have to make sure that we flush the Port-A-Cath.  I will like to see her back in 6 weeks just to make sure that her back is doing  better. [name] Napoleon, MD 2/26/20185:37 PM

## 2016-07-09 ENCOUNTER — Telehealth: Payer: Self-pay | Admitting: *Deleted

## 2016-07-09 LAB — IRON AND TIBC
%SAT: 25 % (ref 21–57)
IRON: 78 ug/dL (ref 41–142)
TIBC: 318 ug/dL (ref 236–444)
UIBC: 240 ug/dL (ref 120–384)

## 2016-07-09 LAB — TSH: TSH: 1.706 m[IU]/L (ref 0.308–3.960)

## 2016-07-09 LAB — FERRITIN: FERRITIN: 30 ng/mL (ref 9–269)

## 2016-07-09 NOTE — Telephone Encounter (Addendum)
Patient aware of results  ----- Message from [name], MD sent at 07/09/2016 12:02 PM EST ----- Call - iron level is ok!!  [name]

## 2016-07-09 NOTE — Telephone Encounter (Signed)
Per call from [facility] office, I have scheduled appts for port flushes. Appts made and [facility] office will contact the patient.

## 2016-07-10 ENCOUNTER — Other Ambulatory Visit: Payer: Self-pay | Admitting: Hematology & Oncology

## 2016-07-10 ENCOUNTER — Other Ambulatory Visit: Payer: Self-pay | Admitting: Family

## 2016-07-10 DIAGNOSIS — M545 Low back pain, unspecified: Secondary | ICD-10-CM

## 2016-07-10 DIAGNOSIS — G8929 Other chronic pain: Secondary | ICD-10-CM

## 2016-07-11 ENCOUNTER — Other Ambulatory Visit: Payer: Self-pay | Admitting: Hematology & Oncology

## 2016-07-11 ENCOUNTER — Other Ambulatory Visit: Payer: Self-pay | Admitting: Internal Medicine

## 2016-07-12 NOTE — Telephone Encounter (Signed)
Pt would like a refill. Please advise

## 2016-07-13 ENCOUNTER — Ambulatory Visit (HOSPITAL_BASED_OUTPATIENT_CLINIC_OR_DEPARTMENT_OTHER)
Admission: RE | Admit: 2016-07-13 | Discharge: 2016-07-13 | Disposition: A | Discharge status: Home / Self Care | Payer: Medicare Other | Source: Ambulatory Visit | Attending: Hematology & Oncology | Admitting: Hematology & Oncology

## 2016-07-13 DIAGNOSIS — M1288 Other specific arthropathies, not elsewhere classified, other specified site: Secondary | ICD-10-CM | POA: Insufficient documentation

## 2016-07-13 DIAGNOSIS — M5137 Other intervertebral disc degeneration, lumbosacral region: Secondary | ICD-10-CM | POA: Insufficient documentation

## 2016-07-13 DIAGNOSIS — G8929 Other chronic pain: Secondary | ICD-10-CM | POA: Insufficient documentation

## 2016-07-13 DIAGNOSIS — M545 Low back pain, unspecified: Secondary | ICD-10-CM

## 2016-07-13 DIAGNOSIS — M47816 Spondylosis without myelopathy or radiculopathy, lumbar region: Secondary | ICD-10-CM | POA: Diagnosis not present

## 2016-07-13 MED ORDER — GADOBENATE DIMEGLUMINE 529 MG/ML IV SOLN: 20.0000 mL | Freq: Once | INTRAVENOUS | Status: DC | PRN

## 2016-07-15 DIAGNOSIS — M5441 Lumbago with sciatica, right side: Secondary | ICD-10-CM | POA: Diagnosis not present

## 2016-07-16 ENCOUNTER — Telehealth: Payer: Self-pay | Admitting: Family

## 2016-07-16 ENCOUNTER — Ambulatory Visit (HOSPITAL_COMMUNITY): Payer: 59

## 2016-07-16 NOTE — Telephone Encounter (Signed)
Call to give MRI results, No answer, left message with call back number. Also forwarded results to her PCP and Neurologist as well.

## 2016-07-19 DIAGNOSIS — M47816 Spondylosis without myelopathy or radiculopathy, lumbar region: Secondary | ICD-10-CM | POA: Diagnosis not present

## 2016-07-23 DIAGNOSIS — M47816 Spondylosis without myelopathy or radiculopathy, lumbar region: Secondary | ICD-10-CM | POA: Diagnosis not present

## 2016-08-10 ENCOUNTER — Other Ambulatory Visit: Payer: Self-pay | Admitting: Hematology & Oncology

## 2016-08-15 DIAGNOSIS — M25562 Pain in left knee: Secondary | ICD-10-CM | POA: Diagnosis not present

## 2016-08-15 DIAGNOSIS — M25552 Pain in left hip: Secondary | ICD-10-CM | POA: Diagnosis not present

## 2016-08-19 ENCOUNTER — Ambulatory Visit (HOSPITAL_BASED_OUTPATIENT_CLINIC_OR_DEPARTMENT_OTHER): Payer: Medicare Other

## 2016-08-19 DIAGNOSIS — Z452 Encounter for adjustment and management of vascular access device: Secondary | ICD-10-CM | POA: Diagnosis not present

## 2016-08-19 DIAGNOSIS — Z8589 Personal history of malignant neoplasm of other organs and systems: Secondary | ICD-10-CM | POA: Diagnosis not present

## 2016-08-19 DIAGNOSIS — M25562 Pain in left knee: Secondary | ICD-10-CM | POA: Diagnosis not present

## 2016-08-19 DIAGNOSIS — Z95828 Presence of other vascular implants and grafts: Secondary | ICD-10-CM

## 2016-08-19 MED ORDER — HEPARIN SOD (PORK) LOCK FLUSH 100 UNIT/ML IV SOLN
500.0000 [IU] | Freq: Once | INTRAVENOUS | Status: AC | PRN
  Administered 2016-08-19: 500 [IU] via INTRAVENOUS
  Filled 2016-08-19: qty 5

## 2016-08-19 MED ORDER — SODIUM CHLORIDE 0.9 % IJ SOLN
10.0000 mL | INTRAMUSCULAR | Status: DC | PRN
  Administered 2016-08-19: 10 mL via INTRAVENOUS
  Filled 2016-08-19: qty 10

## 2016-08-20 DIAGNOSIS — M7632 Iliotibial band syndrome, left leg: Secondary | ICD-10-CM | POA: Diagnosis not present

## 2016-08-20 DIAGNOSIS — M25552 Pain in left hip: Secondary | ICD-10-CM | POA: Diagnosis not present

## 2016-08-20 DIAGNOSIS — M222X2 Patellofemoral disorders, left knee: Secondary | ICD-10-CM | POA: Diagnosis not present

## 2016-08-20 DIAGNOSIS — M25562 Pain in left knee: Secondary | ICD-10-CM | POA: Diagnosis not present

## 2016-08-22 DIAGNOSIS — M222X2 Patellofemoral disorders, left knee: Secondary | ICD-10-CM | POA: Diagnosis not present

## 2016-08-22 DIAGNOSIS — M7632 Iliotibial band syndrome, left leg: Secondary | ICD-10-CM | POA: Diagnosis not present

## 2016-08-22 DIAGNOSIS — M25552 Pain in left hip: Secondary | ICD-10-CM | POA: Diagnosis not present

## 2016-08-22 DIAGNOSIS — M25562 Pain in left knee: Secondary | ICD-10-CM | POA: Diagnosis not present

## 2016-09-03 DIAGNOSIS — M7632 Iliotibial band syndrome, left leg: Secondary | ICD-10-CM | POA: Diagnosis not present

## 2016-09-03 DIAGNOSIS — M25562 Pain in left knee: Secondary | ICD-10-CM | POA: Diagnosis not present

## 2016-09-03 DIAGNOSIS — M25552 Pain in left hip: Secondary | ICD-10-CM | POA: Diagnosis not present

## 2016-09-03 DIAGNOSIS — M222X2 Patellofemoral disorders, left knee: Secondary | ICD-10-CM | POA: Diagnosis not present

## 2016-09-04 ENCOUNTER — Other Ambulatory Visit: Payer: Self-pay | Admitting: Hematology & Oncology

## 2016-09-06 DIAGNOSIS — M222X2 Patellofemoral disorders, left knee: Secondary | ICD-10-CM | POA: Diagnosis not present

## 2016-09-06 DIAGNOSIS — M25552 Pain in left hip: Secondary | ICD-10-CM | POA: Diagnosis not present

## 2016-09-06 DIAGNOSIS — M7632 Iliotibial band syndrome, left leg: Secondary | ICD-10-CM | POA: Diagnosis not present

## 2016-09-06 DIAGNOSIS — M25562 Pain in left knee: Secondary | ICD-10-CM | POA: Diagnosis not present

## 2016-09-10 DIAGNOSIS — M25552 Pain in left hip: Secondary | ICD-10-CM | POA: Diagnosis not present

## 2016-09-10 DIAGNOSIS — M7632 Iliotibial band syndrome, left leg: Secondary | ICD-10-CM | POA: Diagnosis not present

## 2016-09-10 DIAGNOSIS — M222X2 Patellofemoral disorders, left knee: Secondary | ICD-10-CM | POA: Diagnosis not present

## 2016-09-10 DIAGNOSIS — M25562 Pain in left knee: Secondary | ICD-10-CM | POA: Diagnosis not present

## 2016-09-11 DIAGNOSIS — M25552 Pain in left hip: Secondary | ICD-10-CM | POA: Diagnosis not present

## 2016-09-12 ENCOUNTER — Other Ambulatory Visit: Payer: Self-pay | Admitting: Hematology & Oncology

## 2016-09-13 DIAGNOSIS — M25552 Pain in left hip: Secondary | ICD-10-CM | POA: Diagnosis not present

## 2016-09-17 DIAGNOSIS — M25552 Pain in left hip: Secondary | ICD-10-CM | POA: Diagnosis not present

## 2016-10-08 ENCOUNTER — Other Ambulatory Visit: Payer: Self-pay | Admitting: Internal Medicine

## 2016-10-12 ENCOUNTER — Other Ambulatory Visit: Payer: Self-pay | Admitting: Internal Medicine

## 2016-10-12 ENCOUNTER — Other Ambulatory Visit: Payer: Self-pay | Admitting: Neurology

## 2016-10-12 DIAGNOSIS — H53143 Visual discomfort, bilateral: Secondary | ICD-10-CM

## 2016-10-12 DIAGNOSIS — G43109 Migraine with aura, not intractable, without status migrainosus: Secondary | ICD-10-CM

## 2016-10-12 DIAGNOSIS — G4701 Insomnia due to medical condition: Secondary | ICD-10-CM

## 2016-10-12 DIAGNOSIS — H8113 Benign paroxysmal vertigo, bilateral: Secondary | ICD-10-CM

## 2016-10-14 ENCOUNTER — Ambulatory Visit: Payer: Medicare Other

## 2016-10-14 ENCOUNTER — Ambulatory Visit (HOSPITAL_BASED_OUTPATIENT_CLINIC_OR_DEPARTMENT_OTHER): Payer: Medicare Other

## 2016-10-14 DIAGNOSIS — G8929 Other chronic pain: Secondary | ICD-10-CM | POA: Diagnosis not present

## 2016-10-14 DIAGNOSIS — D500 Iron deficiency anemia secondary to blood loss (chronic): Secondary | ICD-10-CM | POA: Diagnosis not present

## 2016-10-14 DIAGNOSIS — M545 Low back pain, unspecified: Secondary | ICD-10-CM

## 2016-10-14 DIAGNOSIS — Z8589 Personal history of malignant neoplasm of other organs and systems: Secondary | ICD-10-CM

## 2016-10-14 DIAGNOSIS — G63 Polyneuropathy in diseases classified elsewhere: Secondary | ICD-10-CM | POA: Diagnosis not present

## 2016-10-14 DIAGNOSIS — D508 Other iron deficiency anemias: Secondary | ICD-10-CM

## 2016-10-14 DIAGNOSIS — Z1501 Genetic susceptibility to malignant neoplasm of breast: Secondary | ICD-10-CM

## 2016-10-14 DIAGNOSIS — Z1509 Genetic susceptibility to other malignant neoplasm: Secondary | ICD-10-CM

## 2016-10-14 DIAGNOSIS — C801 Malignant (primary) neoplasm, unspecified: Secondary | ICD-10-CM | POA: Diagnosis not present

## 2016-10-14 LAB — CBC WITH DIFFERENTIAL (CANCER CENTER ONLY)
BASO#: 0 10*3/uL (ref 0.0–0.2)
BASO%: 0.1 % (ref 0.0–2.0)
EOS ABS: 0.1 10*3/uL (ref 0.0–0.5)
EOS%: 1.4 % (ref 0.0–7.0)
HEMATOCRIT: 41 % (ref 34.8–46.6)
HEMOGLOBIN: 13.8 g/dL (ref 11.6–15.9)
LYMPH#: 1.9 10*3/uL (ref 0.9–3.3)
LYMPH%: 25.1 % (ref 14.0–48.0)
MCH: 30.1 pg (ref 26.0–34.0)
MCHC: 33.7 g/dL (ref 32.0–36.0)
MCV: 89 fL (ref 81–101)
MONO#: 0.5 10*3/uL (ref 0.1–0.9)
MONO%: 6.4 % (ref 0.0–13.0)
NEUT%: 67 % (ref 39.6–80.0)
NEUTROS ABS: 5 10*3/uL (ref 1.5–6.5)
Platelets: 240 10*3/uL (ref 145–400)
RBC: 4.59 10*6/uL (ref 3.70–5.32)
RDW: 14.6 % (ref 11.1–15.7)
WBC: 7.4 10*3/uL (ref 3.9–10.0)

## 2016-10-14 LAB — CMP (CANCER CENTER ONLY)
ALBUMIN: 3.8 g/dL (ref 3.3–5.5)
ALT(SGPT): 35 U/L (ref 10–47)
AST: 31 U/L (ref 11–38)
Alkaline Phosphatase: 81 U/L (ref 26–84)
BUN, Bld: 26 mg/dL — ABNORMAL HIGH (ref 7–22)
CALCIUM: 9.1 mg/dL (ref 8.0–10.3)
CHLORIDE: 101 meq/L (ref 98–108)
CO2: 28 meq/L (ref 18–33)
Creat: 1.3 mg/dl — ABNORMAL HIGH (ref 0.6–1.2)
GLUCOSE: 79 mg/dL (ref 73–118)
Potassium: 3.2 mEq/L — ABNORMAL LOW (ref 3.3–4.7)
SODIUM: 137 meq/L (ref 128–145)
Total Bilirubin: 0.6 mg/dl (ref 0.20–1.60)
Total Protein: 6.9 g/dL (ref 6.4–8.1)

## 2016-10-14 LAB — RETICULOCYTES: RETICULOCYTE COUNT: 1.8 % (ref 0.6–2.6)

## 2016-10-14 NOTE — Telephone Encounter (Signed)
Due for yearly 11/2016

## 2016-10-15 LAB — IRON AND TIBC
%SAT: 20 % — ABNORMAL LOW (ref 21–57)
Iron: 73 ug/dL (ref 41–142)
TIBC: 358 ug/dL (ref 236–444)
UIBC: 285 ug/dL (ref 120–384)

## 2016-10-15 LAB — FERRITIN: FERRITIN: 39 ng/mL (ref 9–269)

## 2016-10-15 LAB — CA 125: CANCER ANTIGEN (CA) 125: 4.8 U/mL (ref 0.0–38.1)

## 2016-10-16 DIAGNOSIS — M25552 Pain in left hip: Secondary | ICD-10-CM | POA: Diagnosis not present

## 2016-10-16 NOTE — Telephone Encounter (Signed)
Can refill x 1 months  woth until gets yearly exam

## 2016-10-17 ENCOUNTER — Telehealth: Payer: Self-pay | Admitting: Emergency Medicine

## 2016-10-17 ENCOUNTER — Other Ambulatory Visit: Payer: Self-pay | Admitting: Internal Medicine

## 2016-10-17 MED ORDER — TRIAMTERENE-HCTZ 37.5-25 MG PO TABS: 2.0000 | ORAL_TABLET | Freq: Every day | ORAL | 0 refills | Status: DC

## 2016-10-17 MED ORDER — ORPHENADRINE CITRATE ER 100 MG PO TB12: ORAL_TABLET | ORAL | 0 refills | Status: DC

## 2016-10-17 NOTE — Telephone Encounter (Signed)
Pt is annual physical is coming up for scheduling. Sent in a 30 day supply for Maxzide, Norflex, and Lyrica. Please help patient schedule. Thank you

## 2016-10-21 ENCOUNTER — Telehealth: Payer: Self-pay

## 2016-10-21 NOTE — Telephone Encounter (Signed)
Received PA request for Orphenadrine 100 mg tablets. PA submitted & is pending. Key: [id number]

## 2016-10-21 NOTE — Telephone Encounter (Signed)
lmom for pt to callback to sch an appt

## 2016-10-22 NOTE — Telephone Encounter (Signed)
FYI

## 2016-10-22 NOTE — Telephone Encounter (Signed)
Received denial from insurance company. PA is not approved with a dx of muscle spasms. Can do appeal if there is another dx that can be used.

## 2016-10-23 NOTE — Telephone Encounter (Signed)
Tell patient  Not covered for muscle spasms .  Advise   Ov to discuss to see if other options

## 2016-10-23 NOTE — Telephone Encounter (Signed)
Left a VM for pt to give the office a call back

## 2016-10-25 NOTE — Telephone Encounter (Signed)
Pt has medication her other insurance has covered it.

## 2016-10-25 NOTE — Telephone Encounter (Signed)
Pt has been sch

## 2016-11-01 ENCOUNTER — Other Ambulatory Visit: Payer: Self-pay | Admitting: *Deleted

## 2016-11-04 ENCOUNTER — Other Ambulatory Visit: Payer: 59

## 2016-11-04 ENCOUNTER — Encounter: Payer: Self-pay | Admitting: Hematology & Oncology

## 2016-11-04 ENCOUNTER — Ambulatory Visit (HOSPITAL_BASED_OUTPATIENT_CLINIC_OR_DEPARTMENT_OTHER): Payer: Medicare Other | Admitting: Hematology & Oncology

## 2016-11-04 VITALS — BP 128/99 | HR 87 | Temp 97.9°F | Resp 19 | Wt 231.0 lb

## 2016-11-04 DIAGNOSIS — G622 Polyneuropathy due to other toxic agents: Secondary | ICD-10-CM

## 2016-11-04 DIAGNOSIS — G629 Polyneuropathy, unspecified: Secondary | ICD-10-CM | POA: Diagnosis not present

## 2016-11-04 DIAGNOSIS — D500 Iron deficiency anemia secondary to blood loss (chronic): Secondary | ICD-10-CM

## 2016-11-04 DIAGNOSIS — Z8589 Personal history of malignant neoplasm of other organs and systems: Secondary | ICD-10-CM

## 2016-11-04 HISTORY — DX: Iron deficiency anemia secondary to blood loss (chronic): D50.0

## 2016-11-04 NOTE — Progress Notes (Signed)
Hematology and Oncology Follow Up Visit  [name] [id number] Feb 05, 1961 56 y.o. 11/04/2016   Principle Diagnosis:  1. 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 2. BRCA1 positive. 3. Severe chemotherapy-induced neuropathy. 4. Intermittent iron-deficiency anemia.  Current Therapy:    Observation     Interim History:  Ms.  [name] is back for followup. We last saw her back in  February.    her back pain is better. She saw orthopedic surgery. Ultimately she was found to have left hip pain. She had injections into the left hip which took care of her back.  She is getting ready to go to [place]. Her son lives in [place]. She will be there for 2 weeks.  She still has the neuropathy. This is been ultimately stable.  Overall, her performance status is ECOG 1.  Medications:  Current Outpatient Prescriptions:  .  aspirin 81 MG tablet, Take 81 mg by mouth daily. 2 TABS IN PM, Disp: , Rfl:  .  butalbital-acetaminophen-caffeine (FIORICET, ESGIC) 50-325-40 MG tablet, Take 1 tablet by mouth every 6 (six) hours as needed for headache., Disp: 10 tablet, Rfl: 3 .  donepezil (ARICEPT) 10 MG tablet, TAKE 1 TABLET BY MOUTH EVERY MORNING, Disp: 90 tablet, Rfl: 1 .  DULoxetine (CYMBALTA) 60 MG capsule, Take 1 capsule (60 mg total) by mouth daily., Disp: 30 capsule, Rfl: 3 .  LYRICA 150 MG capsule, TAKE ONE CAPSULE BY MOUTH TWICE DAILY, Disp: 60 capsule, Rfl: 0 .  Multiple Vitamin (MULTIVITAMIN WITH MINERALS) TABS tablet, Take 1 tablet by mouth daily., Disp: , Rfl:  .  orphenadrine (NORFLEX) 100 MG tablet, TAKE 1 TABLET BY MOUTH TWICE DAILY AS NEEDED FOR MUSCLE SPASMS, Disp: 60 tablet, Rfl: 0 .  potassium chloride (K-DUR) 10 MEQ tablet, TAKE 2 TABLETS BY MOUTH EVERY DAY, Disp: 60 tablet, Rfl: 11 .  simvastatin (ZOCOR) 40 MG tablet, TAKE 1 TABLET BY MOUTH DAILY, Disp: 90 tablet, Rfl: 0 .  triamterene-hydrochlorothiazide (MAXZIDE-25) 37.5-25 MG tablet, Take 2 tablets by mouth  daily., Disp: 60 tablet, Rfl: 0 .  Vitamin D, Ergocalciferol, (DRISDOL) 50000 units CAPS capsule, TAKE ONE CAPSULE BY MOUTH EVERY WEEK, Disp: 12 capsule, Rfl: 0 .  zolpidem (AMBIEN) 10 MG tablet, Take 1 tablet (10 mg total) by mouth at bedtime., Disp: 30 tablet, Rfl: 5  Allergies:  Allergies  Allergen Reactions  . Codeine Nausea Only    REACTION: Nausea  . Orange Itching and Rash    Past Medical History, Surgical history, Social history, and Family History were reviewed and updated.  Review of Systems: As above  Physical Exam:  weight is 231 lb (104.8 kg). Her oral temperature is 97.9 F (36.6 C). Her blood pressure is 128/99 (abnormal) and her pulse is 87. Her respiration is 19 and oxygen saturation is 99%.   Obese African-American female in no obvious distress. Head and neck exam shows no ocular or oral lesions. Shows no pulsatile cervical or supraclavicular lymph nodes. Lungs are clear. Cardiac exam regular rate and rhythm with no murmurs rubs or bruits. Abdomen is soft with good bowel sounds. There is no fluid wave. There is no palpable liver or spleen tip. She has well-healed laparotomy scar. Back examShows some slight tenderness over the lower spine. She has some slight muscle spasms in the lumbar paravertebral region.. Extremities shows no clubbing cyanosis or edema. Neurological exam shows no focal neurological deficits. She does have some increased sensitivity to touch in her feet. Skin exam no rashes.  Lab Results  Component  Value Date   WBC 7.4 10/14/2016   HGB 13.8 10/14/2016   HCT 41.0 10/14/2016   MCV 89 10/14/2016   PLT 240 10/14/2016     Chemistry      Component Value Date/Time   NA 137 10/14/2016 1359   NA 139 12/27/2015 1156   K 3.2 (L) 10/14/2016 1359   K 3.9 12/27/2015 1156   CL 101 10/14/2016 1359   CO2 28 10/14/2016 1359   CO2 27 12/27/2015 1156   BUN 26 (H) 10/14/2016 1359   BUN 14.9 12/27/2015 1156   CREATININE 1.3 (H) 10/14/2016 1359   CREATININE  0.9 12/27/2015 1156      Component Value Date/Time   CALCIUM 9.1 10/14/2016 1359   CALCIUM 9.7 12/27/2015 1156   ALKPHOS 81 10/14/2016 1359   ALKPHOS 106 12/27/2015 1156   AST 31 10/14/2016 1359   AST 29 12/27/2015 1156   ALT 35 10/14/2016 1359   ALT 28 12/27/2015 1156   BILITOT 0.60 10/14/2016 1359   BILITOT 0.33 12/27/2015 1156         Impression and Plan: [name] is 56 -year-old African-American female. She has a remote history of a poorly differentiated carcinoma which was felt to be ovarian. Of note she is BRCA1 (+). She had 6 cycles of chemotherapy with carboplatin/Taxotere. She has now been in remission for close to 7 years.   I'm glad that the back pain is better. It sounds like this was all hip pain.   Her iron levels are low. I think she would benefit from some IV iron before she goes out to [place]. We'll see about getting her set up this week before she goes.  I will like to see her back in 6 weeks. We will flush her [place]  . [name], MD 6/25/20184:58 PM

## 2016-11-08 ENCOUNTER — Ambulatory Visit (HOSPITAL_BASED_OUTPATIENT_CLINIC_OR_DEPARTMENT_OTHER): Payer: Medicare Other

## 2016-11-08 VITALS — BP 129/80 | HR 76 | Temp 97.8°F | Resp 17

## 2016-11-08 DIAGNOSIS — C801 Malignant (primary) neoplasm, unspecified: Secondary | ICD-10-CM

## 2016-11-08 DIAGNOSIS — D500 Iron deficiency anemia secondary to blood loss (chronic): Secondary | ICD-10-CM

## 2016-11-08 DIAGNOSIS — D509 Iron deficiency anemia, unspecified: Secondary | ICD-10-CM

## 2016-11-08 DIAGNOSIS — Z95828 Presence of other vascular implants and grafts: Secondary | ICD-10-CM

## 2016-11-08 DIAGNOSIS — G63 Polyneuropathy in diseases classified elsewhere: Secondary | ICD-10-CM

## 2016-11-08 MED ORDER — SODIUM CHLORIDE 0.9 % IJ SOLN
10.0000 mL | INTRAMUSCULAR | Status: DC | PRN
  Administered 2016-11-08: 10 mL via INTRAVENOUS
  Filled 2016-11-08: qty 10

## 2016-11-08 MED ORDER — HEPARIN SOD (PORK) LOCK FLUSH 100 UNIT/ML IV SOLN
500.0000 [IU] | Freq: Once | INTRAVENOUS | Status: AC | PRN
  Administered 2016-11-08: 500 [IU] via INTRAVENOUS
  Filled 2016-11-08: qty 5

## 2016-11-08 MED ORDER — SODIUM CHLORIDE 0.9 % IV SOLN
510.0000 mg | Freq: Once | INTRAVENOUS | Status: AC
  Administered 2016-11-08: 510 mg via INTRAVENOUS
  Filled 2016-11-08: qty 17

## 2016-11-08 NOTE — Patient Instructions (Signed)

## 2016-11-11 ENCOUNTER — Other Ambulatory Visit: Payer: Self-pay | Admitting: Hematology & Oncology

## 2016-11-11 ENCOUNTER — Other Ambulatory Visit: Payer: Self-pay | Admitting: Internal Medicine

## 2016-11-11 ENCOUNTER — Other Ambulatory Visit: Payer: Self-pay | Admitting: Neurology

## 2016-11-12 ENCOUNTER — Other Ambulatory Visit: Payer: Self-pay | Admitting: Neurology

## 2016-11-12 MED ORDER — ZOLPIDEM TARTRATE 10 MG PO TABS: 10.0000 mg | ORAL_TABLET | Freq: Every day | ORAL | 0 refills | Status: DC

## 2016-11-12 NOTE — Telephone Encounter (Signed)
Pt said she is in [place] and [place], [place] is requesting a new RX for zolpidem (AMBIEN) 10 MG tablet. She said [place] law is requiring a new RX bc it can't be more than 36 days old. She is wanting to get 1 mth supply while she is there (she will return to [facility] 11/24/16), she still has 1 refill left at [place], [place].

## 2016-11-12 NOTE — Telephone Encounter (Signed)
Spoke with pharmacist at [facility] in [place]. Telephone number is [phone]. The pharmacist took the [place] refill over the phone. Pt aware the script called in

## 2016-12-04 ENCOUNTER — Ambulatory Visit: Payer: Medicare Other | Admitting: Neurology

## 2016-12-11 ENCOUNTER — Ambulatory Visit: Payer: Medicare Other

## 2016-12-11 ENCOUNTER — Other Ambulatory Visit (HOSPITAL_BASED_OUTPATIENT_CLINIC_OR_DEPARTMENT_OTHER): Payer: Medicare Other

## 2016-12-11 ENCOUNTER — Ambulatory Visit (HOSPITAL_BASED_OUTPATIENT_CLINIC_OR_DEPARTMENT_OTHER): Payer: Medicare Other | Admitting: Family

## 2016-12-11 VITALS — BP 119/84 | HR 77 | Temp 97.6°F | Resp 18 | Wt 232.0 lb

## 2016-12-11 DIAGNOSIS — D509 Iron deficiency anemia, unspecified: Secondary | ICD-10-CM

## 2016-12-11 DIAGNOSIS — G622 Polyneuropathy due to other toxic agents: Secondary | ICD-10-CM

## 2016-12-11 DIAGNOSIS — C801 Malignant (primary) neoplasm, unspecified: Secondary | ICD-10-CM

## 2016-12-11 DIAGNOSIS — Z95828 Presence of other vascular implants and grafts: Secondary | ICD-10-CM

## 2016-12-11 DIAGNOSIS — D500 Iron deficiency anemia secondary to blood loss (chronic): Secondary | ICD-10-CM

## 2016-12-11 DIAGNOSIS — Z1509 Genetic susceptibility to other malignant neoplasm: Principal | ICD-10-CM

## 2016-12-11 DIAGNOSIS — Z8589 Personal history of malignant neoplasm of other organs and systems: Secondary | ICD-10-CM

## 2016-12-11 DIAGNOSIS — G629 Polyneuropathy, unspecified: Secondary | ICD-10-CM

## 2016-12-11 DIAGNOSIS — Z1501 Genetic susceptibility to malignant neoplasm of breast: Secondary | ICD-10-CM

## 2016-12-11 LAB — CMP (CANCER CENTER ONLY)
ALBUMIN: 3.5 g/dL (ref 3.3–5.5)
ALT(SGPT): 30 U/L (ref 10–47)
AST: 26 U/L (ref 11–38)
Alkaline Phosphatase: 74 U/L (ref 26–84)
BILIRUBIN TOTAL: 0.5 mg/dL (ref 0.20–1.60)
BUN: 19 mg/dL (ref 7–22)
CHLORIDE: 103 meq/L (ref 98–108)
CO2: 29 meq/L (ref 18–33)
CREATININE: 0.7 mg/dL (ref 0.6–1.2)
Calcium: 9.3 mg/dL (ref 8.0–10.3)
GLUCOSE: 87 mg/dL (ref 73–118)
Potassium: 3.5 mEq/L (ref 3.3–4.7)
SODIUM: 138 meq/L (ref 128–145)
Total Protein: 6.5 g/dL (ref 6.4–8.1)

## 2016-12-11 LAB — CBC WITH DIFFERENTIAL (CANCER CENTER ONLY)
BASO#: 0 10*3/uL (ref 0.0–0.2)
BASO%: 0.2 % (ref 0.0–2.0)
EOS%: 2 % (ref 0.0–7.0)
Eosinophils Absolute: 0.1 10*3/uL (ref 0.0–0.5)
HCT: 40.2 % (ref 34.8–46.6)
HEMOGLOBIN: 13.6 g/dL (ref 11.6–15.9)
LYMPH#: 1.6 10*3/uL (ref 0.9–3.3)
LYMPH%: 24 % (ref 14.0–48.0)
MCH: 30.3 pg (ref 26.0–34.0)
MCHC: 33.8 g/dL (ref 32.0–36.0)
MCV: 90 fL (ref 81–101)
MONO#: 0.4 10*3/uL (ref 0.1–0.9)
MONO%: 6.3 % (ref 0.0–13.0)
NEUT%: 67.5 % (ref 39.6–80.0)
NEUTROS ABS: 4.5 10*3/uL (ref 1.5–6.5)
PLATELETS: 216 10*3/uL (ref 145–400)
RBC: 4.49 10*6/uL (ref 3.70–5.32)
RDW: 16.2 % — ABNORMAL HIGH (ref 11.1–15.7)
WBC: 6.6 10*3/uL (ref 3.9–10.0)

## 2016-12-11 MED ORDER — SODIUM CHLORIDE 0.9 % IJ SOLN
10.0000 mL | INTRAMUSCULAR | Status: DC | PRN
  Administered 2016-12-11: 10 mL via INTRAVENOUS
  Filled 2016-12-11: qty 10

## 2016-12-11 MED ORDER — HEPARIN SOD (PORK) LOCK FLUSH 100 UNIT/ML IV SOLN
500.0000 [IU] | Freq: Once | INTRAVENOUS | Status: AC | PRN
  Administered 2016-12-11: 500 [IU] via INTRAVENOUS
  Filled 2016-12-11: qty 5

## 2016-12-11 NOTE — Progress Notes (Signed)
Hematology and Oncology Follow Up Visit  [name] [id number] 24-Feb-1961 56 y.o. 12/11/2016   Principle Diagnosis:  1. 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 BRCA1 positive 3. Severe chemotherapy-induced neuropathy 4. Intermittent iron-deficiency anemia  Current Therapy:   Observation   Interim History:  [name] is here today for follow-up. She is doing well and has no complaints at this time.  She is feeling much better since getting the hip injection. She has been able to travel and just got back from [place] and the [place]. No c/o pain at this time.  No fever, chills, n/v, cough, rash, dizziness, SOB, chest pain, abdominal pain or changes in bowel or bladder habits.  No lymphadenopathy found on exam. No episodes of bleeding, bruising or petechiae.  No swelling or tenderness in her extremities. The neuropathy in her lower extremities is unchanged.  She has maintained a good appetite and is staying well hydrated. Her weight is stable.   ECOG Performance Status: 0 - Asymptomatic  Medications:  Allergies as of 12/11/2016      Reactions   Codeine Nausea Only   REACTION: Nausea   Orange Itching, Rash      Medication List       Accurate as of 12/11/16  3:29 PM. Always use your most recent med list.          aspirin 81 MG tablet Take 81 mg by mouth daily. 2 TABS IN PM   butalbital-acetaminophen-caffeine 50-325-40 MG tablet Commonly known as:  FIORICET, ESGIC Take 1 tablet by mouth every 6 (six) hours as needed for headache.   donepezil 10 MG tablet Commonly known as:  ARICEPT TAKE 1 TABLET BY MOUTH EVERY MORNING   DULoxetine 60 MG capsule Commonly known as:  CYMBALTA TAKE ONE CAPSULE BY MOUTH EVERY DAY   LYRICA 150 MG capsule Generic drug:  pregabalin TAKE ONE CAPSULE BY MOUTH TWICE DAILY   multivitamin with minerals Tabs tablet Take 1 tablet by mouth daily.   orphenadrine 100 MG tablet Commonly known as:  NORFLEX TAKE 1  TABLET BY MOUTH TWICE DAILY AS NEEDED FOR MUSCLE SPASMS   potassium chloride 10 MEQ tablet Commonly known as:  K-DUR TAKE 2 TABLETS BY MOUTH EVERY DAY   simvastatin 40 MG tablet Commonly known as:  ZOCOR TAKE 1 TABLET BY MOUTH DAILY   triamterene-hydrochlorothiazide 37.5-25 MG tablet Commonly known as:  MAXZIDE-25 Take 2 tablets by mouth daily.   Vitamin D (Ergocalciferol) 50000 units Caps capsule Commonly known as:  DRISDOL TAKE ONE CAPSULE BY MOUTH EVERY WEEK   zolpidem 10 MG tablet Commonly known as:  AMBIEN Take 1 tablet (10 mg total) by mouth at bedtime.       Allergies:  Allergies  Allergen Reactions  . Codeine Nausea Only    REACTION: Nausea  . Orange Itching and Rash    Past Medical History, Surgical history, Social history, and Family History were reviewed and updated.  Review of Systems: All other 10 point review of systems is negative.   Physical Exam:  weight is 232 lb (105.2 kg). Her oral temperature is 97.6 F (36.4 C). Her blood pressure is 119/84 and her pulse is 77. Her respiration is 18 and oxygen saturation is 100%.   Wt Readings from Last 3 Encounters:  12/11/16 232 lb (105.2 kg)  11/04/16 231 lb (104.8 kg)  07/08/16 232 lb 1.9 oz (105.3 kg)    Ocular: Sclerae unicteric, pupils equal, round and reactive to light Ear-nose-throat: Oropharynx clear, dentition fair  Lymphatic: No cervical, supraclavicular or axillary adenopathy Lungs no rales or rhonchi, good excursion bilaterally Heart regular rate and rhythm, no murmur appreciated Abd soft, nontender, positive bowel sounds, no liver or spleen tip palpated on exam, no fluid wave MSK no focal spinal tenderness, no joint edema Neuro: non-focal, well-oriented, appropriate affect Breasts: Deferred   Lab Results  Component Value Date   WBC 6.6 12/11/2016   HGB 13.6 12/11/2016   HCT 40.2 12/11/2016   MCV 90 12/11/2016   PLT 216 12/11/2016   Lab Results  Component Value Date   FERRITIN 39  10/14/2016   IRON 73 10/14/2016   TIBC 358 10/14/2016   UIBC 285 10/14/2016   IRONPCTSAT 20 (L) 10/14/2016   Lab Results  Component Value Date   RBC 4.49 12/11/2016   No results found for: KPAFRELGTCHN, LAMBDASER, KAPLAMBRATIO No results found for: IGGSERUM, IGA, IGMSERUM No results found for: Odetta Pink, SPEI   Chemistry      Component Value Date/Time   NA 137 10/14/2016 1359   NA 139 12/27/2015 1156   K 3.2 (L) 10/14/2016 1359   K 3.9 12/27/2015 1156   CL 101 10/14/2016 1359   CO2 28 10/14/2016 1359   CO2 27 12/27/2015 1156   BUN 26 (H) 10/14/2016 1359   BUN 14.9 12/27/2015 1156   CREATININE 1.3 (H) 10/14/2016 1359   CREATININE 0.9 12/27/2015 1156      Component Value Date/Time   CALCIUM 9.1 10/14/2016 1359   CALCIUM 9.7 12/27/2015 1156   ALKPHOS 81 10/14/2016 1359   ALKPHOS 106 12/27/2015 1156   AST 31 10/14/2016 1359   AST 29 12/27/2015 1156   ALT 35 10/14/2016 1359   ALT 28 12/27/2015 1156   BILITOT 0.60 10/14/2016 1359   BILITOT 0.33 12/27/2015 1156      Impression and Plan: [name] is a very pleasant 56 yo African American female with history of poorly differential carcinoma felt to be ovarian, BRCA1 positive.  She completed 6 cycles of chemo with Carboplatin/Taxotere over 7 years ago.  She is doing well and has no complaints at this time.  We will continue to follow along with her and plan to see her back in another 3 months.  She will have her port flushed every 6 weeks. She likes to go to [facility] for her 6 week flush.  She will contact our office with any other questions or concerns. We can certainly see her sooner if need be.   Eliezer Bottom, NP 8/1/20183:29 PM

## 2016-12-12 LAB — IRON AND TIBC
%SAT: 24 % (ref 21–57)
IRON: 71 ug/dL (ref 41–142)
TIBC: 291 ug/dL (ref 236–444)
UIBC: 221 ug/dL (ref 120–384)

## 2016-12-12 LAB — FERRITIN: FERRITIN: 117 ng/mL (ref 9–269)

## 2016-12-16 ENCOUNTER — Other Ambulatory Visit: Payer: Self-pay | Admitting: Neurology

## 2016-12-16 ENCOUNTER — Other Ambulatory Visit: Payer: Self-pay | Admitting: Internal Medicine

## 2016-12-17 ENCOUNTER — Other Ambulatory Visit: Payer: Self-pay | Admitting: Internal Medicine

## 2017-01-02 ENCOUNTER — Telehealth: Payer: Self-pay | Admitting: Neurology

## 2017-01-02 NOTE — Telephone Encounter (Signed)
[phone] called the clinic pt is going out of the country on 8/25 and returning 9/15, she is wanting to get zolpidem (AMBIEN) 10 MG tablet filled early to take with her. Please call

## 2017-01-03 ENCOUNTER — Telehealth: Payer: Self-pay | Admitting: Neurology

## 2017-01-03 MED ORDER — ZOLPIDEM TARTRATE 10 MG PO TABS: ORAL_TABLET | ORAL | 3 refills | Status: DC

## 2017-01-03 NOTE — Telephone Encounter (Signed)
Colletta Maryland from [facility] called pt going out of [place] and wants to get refill of zolpidem (AMBIEN) 10 MG tablet a few weeks early (leaving 8-26) Colletta Maryland wants the okay from Dr Brett Fairy to okay, please call

## 2017-01-03 NOTE — Telephone Encounter (Signed)
I am currently on the phone with the pharmacist on hold to address her concern. Will give the approval for the pharmacist to refill this early. I attempted to reach back out to [place] at [facility] and was on hold for 15 min. I recalled and left the message on their provider voicemail. I have also reached out to the patient to express this action has been taken. Pt stated that she would reach out

## 2017-01-03 NOTE — Telephone Encounter (Signed)
Pt now calling stating that she is leaving in the morning and will not be back until 9-15, pt states she needs this today.  Please process, pt has not asked for a call back

## 2017-01-16 NOTE — Telephone Encounter (Signed)
error 

## 2017-01-20 ENCOUNTER — Encounter: Payer: Self-pay | Admitting: Internal Medicine

## 2017-01-20 ENCOUNTER — Ambulatory Visit (INDEPENDENT_AMBULATORY_CARE_PROVIDER_SITE_OTHER): Payer: Medicare Other | Admitting: Internal Medicine

## 2017-01-20 VITALS — BP 140/90 | HR 87 | Temp 97.8°F | Wt 227.0 lb

## 2017-01-20 DIAGNOSIS — J019 Acute sinusitis, unspecified: Secondary | ICD-10-CM | POA: Diagnosis not present

## 2017-01-20 DIAGNOSIS — J069 Acute upper respiratory infection, unspecified: Secondary | ICD-10-CM | POA: Diagnosis not present

## 2017-01-20 MED ORDER — DOXYCYCLINE HYCLATE 100 MG PO TABS: 100.0000 mg | ORAL_TABLET | Freq: Two times a day (BID) | ORAL | 0 refills | Status: DC

## 2017-01-20 NOTE — Progress Notes (Signed)
Chief Complaint  Patient presents with  . Sore Throat    sx started a week ago   . Cough    taking mucinex DM/ Nasal Congestion   . Headache    HPI: [name] 56 y.o.  SDA   Came back from 2 weeks   in [place] and came down   Bad cold   Head and then chest feeling tight but no sob .  Sore throat  And sinus congestion came  back yesterday and feels worse.  After return flight home  Had ear sounds left in plane   Now frontal area    Head hurts  A lot  Congestion  And  Hard to breath without coughing .    ? If fever  At beginning.     mucinex dm helps the cough some ocass use of tobacco .   nasal congestion alternates  No wheezing  Fever chills at this time    ROS: See pertinent positives and negatives per HPI.  Past Medical History:  Diagnosis Date  . 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 Adjustment disorder   . Allergic rhinitis   . Alopecia   . Anxiety and depression   . 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 HLD (hyperlipidemia)   . Hx pulmonary embolism   . Hypertension   . Iron deficiency anemia   . Iron deficiency anemia due to chronic blood loss 11/04/2016  . Metrorrhagia   . Neuropathy   . Obesity   . Ovarian cancer ([place]) 48  . 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 Raynaud's syndrome   . Right groin mass   . Squamous cell carcinoma of skin   . Vision abnormalities   . Vitamin D deficiency     Family History  Problem Relation Age of Onset  . Pulmonary embolism Mother   . Hypertension Father   . Breast cancer Maternal Grandmother        diagnosed in early 84s  . Hyperlipidemia Unknown     Social History   Social History  . Marital status: Divorced    Spouse name: N/A  . Number of children: 1  . Years of education: Master's   Occupational History  .  Unemployed    disabled   Social History Main Topics  . Smoking status: Current Every Day Smoker    Packs/day: 1.00    Years: 37.00    Types: Cigarettes    Start date: 06/16/1978  . Smokeless tobacco: Never Used     Comment: 2- 7-16  STILL SMOKING  . Alcohol use 0.0 oz/week     Comment: rare, 3 weekly  . Drug use: No  . Sexual activity: Not Asked   Other Topics Concern  . None   Social History Narrative   Divorced   1 son /1 adopted adult son  2 dogs    Sales occupation on disability for now   Regular exercise-yes money stress   Daily caffeine use 2-3 and 2 cups of coffee   On since a security disability since 2012   Patient is right-handed.   Patient has a Master's degree.    Outpatient Medications Prior to Visit  Medication Sig Dispense Refill  . aspirin 81 MG tablet Take 81 mg by mouth daily. 2 TABS IN PM    . butalbital-acetaminophen-caffeine (FIORICET, ESGIC) 50-325-40 MG tablet Take 1 tablet by mouth every 6 (six) hours as needed for headache. 10 tablet 3  .  donepezil (ARICEPT) 10 MG tablet TAKE 1 TABLET BY MOUTH EVERY MORNING 90 tablet 1  . DULoxetine (CYMBALTA) 60 MG capsule TAKE ONE CAPSULE BY MOUTH EVERY DAY 90 capsule 1  . LYRICA 150 MG capsule TAKE ONE CAPSULE BY MOUTH TWICE DAILY 60 capsule 2  . Multiple Vitamin (MULTIVITAMIN WITH MINERALS) TABS tablet Take 1 tablet by mouth daily.    . orphenadrine (NORFLEX) 100 MG tablet TAKE 1 TABLET BY MOUTH TWICE DAILY AS NEEDED FOR MUSCLE SPASMS 60 tablet 1  . potassium chloride (K-DUR) 10 MEQ tablet TAKE 2 TABLETS BY MOUTH EVERY DAY 60 tablet 1  . simvastatin (ZOCOR) 40 MG tablet TAKE 1 TABLET BY MOUTH EVERY DAY 30 tablet 0  . triamterene-hydrochlorothiazide (MAXZIDE-25) 37.5-25 MG tablet Take 2 tablets by mouth daily. 60 tablet 0  . Vitamin D, Ergocalciferol, (DRISDOL) 50000 units CAPS capsule TAKE ONE CAPSULE BY MOUTH EVERY WEEK 12 capsule 0  . zolpidem (AMBIEN) 10 MG tablet TAKE 1 TABLET  AT BEDTIME AS NEEDED 30 tablet 3  . orphenadrine (NORFLEX) 100 MG tablet TAKE 1 TABLET BY MOUTH TWICE DAILY AS NEEDED FOR MUSCLE SPASMS 60 tablet 0   No facility-administered medications  prior to visit.      EXAM:  BP 140/90 (BP Location: Right Arm, Patient Position: Sitting, Cuff Size: Normal)   Pulse 87   Temp 97.8 F (36.6 C) (Oral)   Wt 227 lb (103 kg)   SpO2 96%   BMI 33.52 kg/m   Body mass index is 33.52 kg/m. WDWN in NAD  quiet respirations; congested  somewhat hoarse. Non toxic .feels badly  HEENT: Normocephalic ;atraumatic , Eyes;  PERRL, EOMs  Full, lids and conjunctiva clear,,Ears: no deformities, canals nl, TM landmarks normal, Nose: no deformity or discharge but congested;face bifrontal  minimally tender Mouth : OP clear without lesion or edema .  Uvula nl but red op  No lesion good airway  Neck: Supple without adenopathy or masses or bruits Chest:  Clear to A&P without wheezes rales or rhonchi ? Dec air movement  But not taking deep breath  CV:  S1-S2 no gallops or murmurs peripheral perfusion is normal Skin :nl perfusion and no acute rashes   ASSESSMENT AND PLAN:  Discussed the following assessment and plan:  Acute upper respiratory infection of multiple sites  Acute rhinosinusitis - worse after air flight  expectant amangement may  resolve with conservative methods   add antibiotic  if   w/o improv next 48 + hours  or call w alarm sx    Expectant management.  -Patient advised to return or notify health care team  if symptoms worsen ,persist or new concerns arise.  Patient Instructions  Continue sypmtomatic  Treatment for head  cold  Chest cold..  Begin saline nose spray and nasal cortisone  Spray  And  Decongestant .  If not   Beginning to get better with  Headache and  Face pain then  Can add antibiotic .  If getting  High  fever chills coughing blood contact us  For fu .     Standley Brooking. Charron Coultas M.D.

## 2017-01-20 NOTE — Patient Instructions (Signed)
Continue sypmtomatic  Treatment for head  cold  Chest cold..  Begin saline nose spray and nasal cortisone  Spray  And  Decongestant .  If not   Beginning to get better with  Headache and  Face pain then  Can add antibiotic .  If getting  High  fever chills coughing blood contact us  For fu .

## 2017-01-31 ENCOUNTER — Encounter: Payer: Self-pay | Admitting: Internal Medicine

## 2017-02-03 ENCOUNTER — Ambulatory Visit: Payer: 59 | Admitting: Hematology & Oncology

## 2017-02-03 ENCOUNTER — Other Ambulatory Visit: Payer: 59

## 2017-02-08 ENCOUNTER — Other Ambulatory Visit: Payer: Self-pay | Admitting: Internal Medicine

## 2017-02-10 NOTE — Telephone Encounter (Signed)
Please advise ok to refill Lyrica   Patient does have a pending CPE appt 10/22 Last filled 11/12/16 Qty:60 Rf:2

## 2017-02-10 NOTE — Telephone Encounter (Signed)
Pt is calling to see if she can get these medication today that was sent over by the pharmacy.  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  Pharm:  [facility] [address].

## 2017-02-13 NOTE — Telephone Encounter (Signed)
Called in refill on pharmacy voicemail due to long wait to speak with pharmacist.

## 2017-02-13 NOTE — Telephone Encounter (Signed)
Ok to refill for 2 months  She has upcoming appt

## 2017-03-03 ENCOUNTER — Encounter: Payer: 59 | Admitting: Internal Medicine

## 2017-03-10 ENCOUNTER — Telehealth: Payer: Self-pay | Admitting: Neurology

## 2017-03-10 ENCOUNTER — Telehealth: Payer: Self-pay | Admitting: Internal Medicine

## 2017-03-10 ENCOUNTER — Other Ambulatory Visit: Payer: Self-pay | Admitting: Hematology & Oncology

## 2017-03-10 NOTE — Telephone Encounter (Signed)
Called patient to offer her to keep her apt 11/1 as the MD has made some changes and was able to be here. pt states that she already made other commitments and would like to keep the apt cancelled. We have rescheduled her for an apt 05/22/17 at 3:30 pm. Pt verbalized understanding. Pt had no questions at this time but was encouraged to call back if questions arise.

## 2017-03-11 ENCOUNTER — Ambulatory Visit (HOSPITAL_BASED_OUTPATIENT_CLINIC_OR_DEPARTMENT_OTHER): Payer: Medicare Other

## 2017-03-11 ENCOUNTER — Other Ambulatory Visit: Payer: Self-pay | Admitting: Internal Medicine

## 2017-03-11 DIAGNOSIS — Z8589 Personal history of malignant neoplasm of other organs and systems: Secondary | ICD-10-CM | POA: Diagnosis not present

## 2017-03-11 DIAGNOSIS — Z452 Encounter for adjustment and management of vascular access device: Secondary | ICD-10-CM | POA: Diagnosis not present

## 2017-03-11 DIAGNOSIS — Z95828 Presence of other vascular implants and grafts: Secondary | ICD-10-CM

## 2017-03-11 MED ORDER — HEPARIN SOD (PORK) LOCK FLUSH 100 UNIT/ML IV SOLN
500.0000 [IU] | Freq: Once | INTRAVENOUS | Status: AC | PRN
  Administered 2017-03-11: 500 [IU] via INTRAVENOUS
  Filled 2017-03-11: qty 5

## 2017-03-11 MED ORDER — SODIUM CHLORIDE 0.9 % IJ SOLN
10.0000 mL | INTRAMUSCULAR | Status: DC | PRN
  Administered 2017-03-11: 10 mL via INTRAVENOUS
  Filled 2017-03-11: qty 10

## 2017-03-11 NOTE — Telephone Encounter (Signed)
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  [facility] [place] [address] [place]

## 2017-03-11 NOTE — Telephone Encounter (Signed)
Pt requesting 90-day supply asap Refill request for Medication: Norflex 138m - Sig: TAKE 1 TABLET BY MOUTH TWICE DAILY AS NEEDED FOR MUSCLE SPASMS Last Filled: 12/17/16, #60 x 1 refill Previous / Upcoming Appt: 05/30/17 CPX  Please advise Dr PRegis Bill thanks.

## 2017-03-11 NOTE — Telephone Encounter (Signed)
LM x 1 for patient to make aware that Rxs refilled.

## 2017-03-11 NOTE — Telephone Encounter (Signed)
Pt states she is leaving on deployment to [place] city with the [employer] and needs a 90 day of all these scripts.  [facility] [place], [place]

## 2017-03-11 NOTE — Patient Instructions (Signed)

## 2017-03-11 NOTE — Telephone Encounter (Signed)
Rx sent to pharmacy.  LM for patient making aware that all Rxs have been refilled. Nothing further needed.

## 2017-03-11 NOTE — Telephone Encounter (Signed)
Okay to do a 90-day supply for this as needed medicine is  time I advised she not use this medicine on a regular basis because it has risk.

## 2017-03-13 ENCOUNTER — Ambulatory Visit: Payer: Medicare Other | Admitting: Neurology

## 2017-04-16 ENCOUNTER — Other Ambulatory Visit (HOSPITAL_BASED_OUTPATIENT_CLINIC_OR_DEPARTMENT_OTHER): Payer: 59

## 2017-04-16 ENCOUNTER — Encounter: Payer: Self-pay | Admitting: Hematology & Oncology

## 2017-04-16 ENCOUNTER — Other Ambulatory Visit: Payer: Self-pay

## 2017-04-16 ENCOUNTER — Ambulatory Visit (HOSPITAL_BASED_OUTPATIENT_CLINIC_OR_DEPARTMENT_OTHER): Payer: 59 | Admitting: Hematology & Oncology

## 2017-04-16 ENCOUNTER — Ambulatory Visit: Payer: Medicare Other

## 2017-04-16 VITALS — BP 129/89 | HR 78 | Temp 98.5°F | Resp 20 | Wt 213.0 lb

## 2017-04-16 DIAGNOSIS — C801 Malignant (primary) neoplasm, unspecified: Secondary | ICD-10-CM

## 2017-04-16 DIAGNOSIS — D500 Iron deficiency anemia secondary to blood loss (chronic): Secondary | ICD-10-CM

## 2017-04-16 DIAGNOSIS — Z1509 Genetic susceptibility to other malignant neoplasm: Principal | ICD-10-CM

## 2017-04-16 DIAGNOSIS — Z1501 Genetic susceptibility to malignant neoplasm of breast: Secondary | ICD-10-CM

## 2017-04-16 DIAGNOSIS — D509 Iron deficiency anemia, unspecified: Secondary | ICD-10-CM

## 2017-04-16 DIAGNOSIS — Z95828 Presence of other vascular implants and grafts: Secondary | ICD-10-CM

## 2017-04-16 DIAGNOSIS — Z8589 Personal history of malignant neoplasm of other organs and systems: Secondary | ICD-10-CM

## 2017-04-16 LAB — CBC WITH DIFFERENTIAL (CANCER CENTER ONLY)
BASO#: 0 10*3/uL (ref 0.0–0.2)
BASO%: 0.2 % (ref 0.0–2.0)
EOS ABS: 0.1 10*3/uL (ref 0.0–0.5)
EOS%: 1.7 % (ref 0.0–7.0)
HEMATOCRIT: 40.4 % (ref 34.8–46.6)
HEMOGLOBIN: 13.6 g/dL (ref 11.6–15.9)
LYMPH#: 1.1 10*3/uL (ref 0.9–3.3)
LYMPH%: 17 % (ref 14.0–48.0)
MCH: 29.8 pg (ref 26.0–34.0)
MCHC: 33.7 g/dL (ref 32.0–36.0)
MCV: 89 fL (ref 81–101)
MONO#: 0.3 10*3/uL (ref 0.1–0.9)
MONO%: 5 % (ref 0.0–13.0)
NEUT%: 76.1 % (ref 39.6–80.0)
NEUTROS ABS: 4.9 10*3/uL (ref 1.5–6.5)
Platelets: 199 10*3/uL (ref 145–400)
RBC: 4.56 10*6/uL (ref 3.70–5.32)
RDW: 15 % (ref 11.1–15.7)
WBC: 6.5 10*3/uL (ref 3.9–10.0)

## 2017-04-16 LAB — CMP (CANCER CENTER ONLY)
ALBUMIN: 3.6 g/dL (ref 3.3–5.5)
ALT(SGPT): 26 U/L (ref 10–47)
AST: 27 U/L (ref 11–38)
Alkaline Phosphatase: 87 U/L — ABNORMAL HIGH (ref 26–84)
BUN: 18 mg/dL (ref 7–22)
CHLORIDE: 101 meq/L (ref 98–108)
CO2: 30 meq/L (ref 18–33)
CREATININE: 1.2 mg/dL (ref 0.6–1.2)
Calcium: 9.8 mg/dL (ref 8.0–10.3)
Glucose, Bld: 89 mg/dL (ref 73–118)
POTASSIUM: 3.4 meq/L (ref 3.3–4.7)
SODIUM: 147 meq/L — AB (ref 128–145)
TOTAL PROTEIN: 6.5 g/dL (ref 6.4–8.1)
Total Bilirubin: 0.6 mg/dl (ref 0.20–1.60)

## 2017-04-16 MED ORDER — SODIUM CHLORIDE 0.9 % IJ SOLN
10.0000 mL | INTRAMUSCULAR | Status: DC | PRN
  Administered 2017-04-16: 10 mL via INTRAVENOUS
  Filled 2017-04-16: qty 10

## 2017-04-16 MED ORDER — HEPARIN SOD (PORK) LOCK FLUSH 100 UNIT/ML IV SOLN
500.0000 [IU] | Freq: Once | INTRAVENOUS | Status: AC | PRN
  Administered 2017-04-16: 500 [IU] via INTRAVENOUS
  Filled 2017-04-16: qty 5

## 2017-04-16 MED ORDER — ZOLPIDEM TARTRATE 5 MG PO TABS: ORAL_TABLET | ORAL | 2 refills | Status: DC

## 2017-04-16 MED ORDER — PREGABALIN 200 MG PO CAPS: 200.0000 mg | ORAL_CAPSULE | Freq: Two times a day (BID) | ORAL | 3 refills | Status: DC

## 2017-04-16 NOTE — Addendum Note (Signed)
Addended by: Smiley Houseman F on: 04/16/2017 03:19 PM   Modules accepted: Orders

## 2017-04-17 ENCOUNTER — Telehealth: Payer: Self-pay | Admitting: *Deleted

## 2017-04-17 LAB — IRON AND TIBC
%SAT: 29 % (ref 21–57)
IRON: 82 ug/dL (ref 41–142)
TIBC: 285 ug/dL (ref 236–444)
UIBC: 203 ug/dL (ref 120–384)

## 2017-04-17 LAB — FERRITIN: FERRITIN: 51 ng/mL (ref 9–269)

## 2017-04-17 NOTE — Telephone Encounter (Addendum)
Patient is aware of results  ----- Message from [name], MD sent at 04/17/2017 12:43 PM EST ----- Call - iron level is ok!!  [name]
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
## 2017-05-21 ENCOUNTER — Other Ambulatory Visit: Payer: Self-pay | Admitting: Neurology

## 2017-05-21 ENCOUNTER — Other Ambulatory Visit: Payer: Self-pay | Admitting: Internal Medicine

## 2017-05-21 DIAGNOSIS — H8113 Benign paroxysmal vertigo, bilateral: Secondary | ICD-10-CM

## 2017-05-21 DIAGNOSIS — H53143 Visual discomfort, bilateral: Secondary | ICD-10-CM

## 2017-05-21 DIAGNOSIS — G43109 Migraine with aura, not intractable, without status migrainosus: Secondary | ICD-10-CM

## 2017-05-21 DIAGNOSIS — G4701 Insomnia due to medical condition: Secondary | ICD-10-CM

## 2017-05-22 ENCOUNTER — Encounter: Payer: Self-pay | Admitting: Neurology

## 2017-05-22 ENCOUNTER — Ambulatory Visit (INDEPENDENT_AMBULATORY_CARE_PROVIDER_SITE_OTHER): Payer: 59 | Admitting: Neurology

## 2017-05-22 VITALS — BP 134/85 | HR 75 | Ht 69.0 in | Wt 215.0 lb

## 2017-05-22 DIAGNOSIS — G622 Polyneuropathy due to other toxic agents: Secondary | ICD-10-CM | POA: Diagnosis not present

## 2017-05-22 DIAGNOSIS — Z1501 Genetic susceptibility to malignant neoplasm of breast: Secondary | ICD-10-CM

## 2017-05-22 DIAGNOSIS — C801 Malignant (primary) neoplasm, unspecified: Secondary | ICD-10-CM

## 2017-05-22 DIAGNOSIS — I7300 Raynaud's syndrome without gangrene: Secondary | ICD-10-CM

## 2017-05-22 DIAGNOSIS — Z9181 History of falling: Secondary | ICD-10-CM

## 2017-05-22 DIAGNOSIS — G63 Polyneuropathy in diseases classified elsewhere: Secondary | ICD-10-CM

## 2017-05-22 DIAGNOSIS — Z1509 Genetic susceptibility to other malignant neoplasm: Secondary | ICD-10-CM
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
## 2017-05-29 ENCOUNTER — Telehealth: Payer: Self-pay | Admitting: *Deleted

## 2017-05-29 NOTE — Progress Notes (Signed)
Chief Complaint  Patient presents with  . Annual Exam    No new concerns    HPI:  [name] 57 y.o. 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   Neuropathy problematic and risk of fall   Tried tai chi but  In class to stressed to remember what to do   Tobacco down to 1/2ppd dose want to quit.   LIPID no se of med  Health Maintenance  Topic Date Due  . Hepatitis C Screening  Feb 05, 1961  . MAMMOGRAM  02/12/2018  . COLONOSCOPY  12/05/2020  . TETANUS/TDAP  11/03/2023  . INFLUENZA VACCINE  Completed  . HIV Screening  Completed   Health Maintenance Review LIFESTYLE:  Exercise:    lminted activity as possible  Tobacco/ETS: better  1/2ppd  Alcohol: raraely  Sugar beverages: Sleep:  Not enough  Off    Hard to wean off Ambien  Still on  Drug use: no HH:1 3 2  dogs and cat    Hearing: ok  Vision:  No limitations at present . Last eye check UTD  Safety:  Has smoke detector and wears seat belts.  No firearms. No excess sun exposure. Sees dentist regularly.  Falls:  Yes no  injury  Memory:  About the same?   Depression:  Stable no change in depression anx  Nutrition: Eats well balanced diet; adequate calcium and vitamin D. No swallowing chewing problems.  Injury: no major injuries in the last six months.?  Other healthcare providers:  Reviewed today .  Preventive parameters: up-to-date  Reviewed   ADLS:   There are no problems or need for assistance  driving, feeding, obtaining food, dressing, toileting and bathing, managing money using phone.   ROS:  Hair  GEN/ HEENT: No fever, significant weight changes sweats headaches vision  problems hearing changes, CV/ PULM; No chest pain shortness of breath cough, syncope,edema  change in exercise tolerance. GI /GU: No adominal pain, vomiting, change in bowel habits. No blood in the stool. No significant GU symptoms. SKIN/HEME: ,no acute skin rashes suspicious lesions or bleeding. No lymphadenopathy, nodules, masses.  NEURO/ PSYCH:  See above.  IMM/ Allergy: No unusual infections.  Allergy .   REST of 12 system review negative except as per HPI   Past Medical History:  Diagnosis Date  . Adenocarcinoma [facility])    unknown primary probaby ovarian 2009 chemo  . Adjustment disorder   . Allergic rhinitis   . Alopecia   . Anxiety and depression   . 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 HLD (hyperlipidemia)   . Hx pulmonary embolism   . Hypertension   . Iron deficiency anemia   . Iron deficiency anemia due to chronic blood loss 11/04/2016  . Metrorrhagia   . Neuropathy   . Obesity   . Ovarian cancer ([place]) 48  . 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 Raynaud's syndrome   . Right groin mass   . Squamous cell carcinoma of skin   . Vision abnormalities   . Vitamin D deficiency     Family History  Problem Relation  Age of Onset  . Pulmonary embolism Mother   . Hypertension Father   . Breast cancer Maternal Grandmother        diagnosed in early 75s  . Hyperlipidemia Unknown     Social History   Socioeconomic History  . Marital status: Divorced    Spouse name: None  . Number of children: 1  . Years of education: Master's  . Highest education level: None  Social Needs  . Financial resource strain: None  . Food insecurity - worry: None  . Food insecurity - inability: None  . Transportation needs - medical: None  . Transportation needs - non-medical: None  Occupational History    Employer: UNEMPLOYED    Comment: disabled  Tobacco Use  . Smoking status: Current Every Day Smoker    Packs/day: 1.00    Years: 37.00    Pack years: 37.00    Types: Cigarettes     Start date: 06/16/1978  . Smokeless tobacco: Never Used  . Tobacco comment: 2- 7-16  STILL SMOKING  Substance and Sexual Activity  . Alcohol use: Yes    Alcohol/week: 0.0 oz    Comment: rare, 3 weekly  . Drug use: No  . Sexual activity: None  Other Topics Concern  . None  Social History Narrative   Divorced   1 son /1 adopted adult son  2 dogs    Sales occupation on disability for now   Regular exercise-yes money stress   Daily caffeine use 2-3 and 2 cups of coffee   On since a security disability since 2012   Patient is right-handed.   Patient has a Master's degree.    Outpatient Encounter Medications as of 05/30/2017  Medication Sig  . aspirin 81 MG tablet Take 81 mg by mouth daily. 2 TABS IN AM  . butalbital-acetaminophen-caffeine (FIORICET, ESGIC) 50-325-40 MG tablet Take 1 tablet by mouth every 6 (six) hours as needed for headache.  . diclofenac (VOLTAREN) 75 MG EC tablet TK 1 T PO BID AFTER MEALS FOR INFLAMMATION / PAIN / SWELLING PRN ONLY  . donepezil (ARICEPT) 10 MG tablet TAKE 1 TABLET BY MOUTH EVERY MORNING  . DULoxetine (CYMBALTA) 60 MG capsule TAKE ONE CAPSULE BY MOUTH EVERY DAY  . Multiple Vitamin (MULTIVITAMIN WITH MINERALS) TABS tablet Take 1 tablet by mouth daily.  . orphenadrine (NORFLEX) 100 MG tablet Take 1 tablet (100 mg total) by mouth 2 (two) times daily as needed for muscle spasms.  . potassium chloride (K-DUR) 10 MEQ tablet TAKE 2 TABLETS BY MOUTH EVERY DAY  . pregabalin (LYRICA) 200 MG capsule Take 1 capsule (200 mg total) by mouth 2 (two) times daily.  . simvastatin (ZOCOR) 40 MG tablet Take 1 tablet (40 mg total) by mouth daily.  Marland Kitchen triamterene-hydrochlorothiazide (MAXZIDE-25) 37.5-25 MG tablet Take 2 tablets by mouth daily.  . Vitamin D, Ergocalciferol, (DRISDOL) 50000 units CAPS capsule TAKE 1 CAPSULE BY MOUTH ONCE A WEEK  . zolpidem (AMBIEN) 5 MG tablet TAKE 1-2 TABLETS  AT BEDTIME AS NEEDED  . varenicline (CHANTIX STARTING MONTH PAK) 0.5 MG X 11 & 1 MG  X 42 tablet Take one 0.5 mg tablet by mouth once daily for 3 days, then increase to one 0.5 mg tablet twice daily for 4 days, then increase to one 1 mg tablet twice daily.  . varenicline (CHANTIX) 0.5 MG tablet Take 1 tablet (0.5 mg total) by mouth 2 (two) times daily.   No facility-administered encounter medications on file as of 05/30/2017.  EXAM:  BP 114/82 (BP Location: Right Arm, Patient Position: Sitting, Cuff Size: Normal)   Pulse (!) 102   Temp (!) 97.4 F (36.3 C) (Oral)   Ht 5' 7.91" (1.725 m)   Wt 216 lb 3.2 oz (98.1 kg)   BMI 32.96 kg/m   Body mass index is 32.96 kg/m.  Physical Exam: Vital signs reviewed WER:XVQM is a well-developed well-nourished alert cooperative   who appears stated age in no acute distress.  HEENT: normocephalic atraumatic , Eyes: PERRL EOM's full, conjunctiva clear, Nares: paten,t no deformity discharge or tenderness., Ears: no deformity EAC's clear TMs with normal landmarks. Mouth: clear OP, no lesions, edema.  Moist mucous membranes. Dentition in adequate repair. NECK: supple without masses, thyromegaly or bruits. CHEST/PULM:  Clear to auscultation and percussion breath sounds equal no wheeze , rales or rhonchi. No chest wall deformities or tenderness. CV: PMI is nondisplaced, S1 S2 no gallops, murmurs, rubs. Peripheral pulses are full without delay.No JVD .  Breast no nodule  Surgical scars  ABDOMEN: Bowel sounds normal nontender  No guard or rebound, no hepato splenomegal no CVA tenderness.    Scar  Extremtities:  No clubbing cyanosis or edema, no acute joint swelling or redness no focal atrophy NEURO:  Oriented x3, cranial nerves 3-12 appear to be intact, no obvious focal weakness,gait within normal limits dec sense  Feet no ulcers lesion  SKIN: No acute rashes normal turgor, color, no bruising or petechiae. PSYCH: Oriented, good eye contact, no obvious depression anxiety, LN: no cervical axillary inguinal adenopathy    Lab Results    Component Value Date   WBC 6.5 04/16/2017   HGB 13.6 04/16/2017   HCT 40.4 04/16/2017   PLT 199 04/16/2017   GLUCOSE 89 04/16/2017   CHOL 160 05/30/2017   TRIG 159.0 (H) 05/30/2017   HDL 49.00 05/30/2017   LDLDIRECT 98.7 09/04/2009   LDLCALC 79 05/30/2017   ALT 26 04/16/2017   AST 27 04/16/2017   NA 147 (H) 04/16/2017   K 3.4 04/16/2017   CL 101 04/16/2017   CREATININE 1.2 04/16/2017   BUN 18 04/16/2017   CO2 30 04/16/2017   TSH 5.12 (H) 05/30/2017   INR 0.86 06/02/2013    ASSESSMENT AND PLAN:  Discussed the following assessment and plan:  Visit for preventive health examination - Plan: Lipid panel, TSH, Hepatitis C antibody, HIV antibody  Neuropathy due to chemotherapeutic drug ([place]) - Plan: Lipid panel, TSH, Hepatitis C antibody, HIV antibody  Medication management - Plan: Lipid panel, TSH, Hepatitis C antibody, HIV antibody  Hyperlipidemia, unspecified hyperlipidemia type - Plan: Lipid panel, TSH, Hepatitis C antibody, HIV antibody  BRCA gene positive - Plan: Lipid panel, TSH, Hepatitis C antibody, HIV antibody  TOBACCO USE  Neurocognitive deficits - Plan: Lipid panel, TSH, Hepatitis C antibody, HIV antibody  Need for hepatitis C screening test - Plan: Lipid panel, TSH, Hepatitis C antibody, HIV antibody  Screening for HIV (human immunodeficiency virus) - Plan: Lipid panel, TSH, Hepatitis C antibody, HIV antibody  Encounter for tobacco use cessation counseling Trying to  [employer].    Disc   tobacco cessation    Will retry chant ix   Had some se when stressed and working but willing to try again  rx printed .  disc other potions balance exercises etc   Risk benefit of medication sdiscussed Patient Care Team: Panosh, Standley Brooking, MD as PCP - General (Internal Medicine) Gatha Mayer, MD (Gastroenterology) Yaakov Guthrie, PsyD (Psychiatry) Marin Olp,  Rudell Cobb, MD as Attending Physician (Internal Medicine) Dohmeier, Asencion Partridge, MD (Neurology)  Patient  Instructions  continue ttry to  Stop tobacco   Lab today  No chane in  med but can try chantix again   rov in 1 year  Of all ok  Or fu in 3 months about tobacco  Continue working on balance exercise    Standley Brooking. Panosh M.D.

## 2017-05-29 NOTE — Telephone Encounter (Signed)
Pt [employer] form ready for p/u. Fee due for form.

## 2017-05-30 ENCOUNTER — Encounter: Payer: Self-pay | Admitting: Internal Medicine

## 2017-05-30 ENCOUNTER — Ambulatory Visit (INDEPENDENT_AMBULATORY_CARE_PROVIDER_SITE_OTHER): Payer: Medicare Other | Admitting: Internal Medicine

## 2017-05-30 ENCOUNTER — Other Ambulatory Visit: Payer: Medicare Other

## 2017-05-30 VITALS — BP 114/82 | HR 102 | Temp 97.4°F | Ht 67.91 in | Wt 216.2 lb

## 2017-05-30 DIAGNOSIS — T451X5A Adverse effect of antineoplastic and immunosuppressive drugs, initial encounter: Secondary | ICD-10-CM | POA: Diagnosis not present

## 2017-05-30 DIAGNOSIS — Z1509 Genetic susceptibility to other malignant neoplasm: Secondary | ICD-10-CM

## 2017-05-30 DIAGNOSIS — Z1159 Encounter for screening for other viral diseases: Secondary | ICD-10-CM

## 2017-05-30 DIAGNOSIS — R7989 Other specified abnormal findings of blood chemistry: Secondary | ICD-10-CM

## 2017-05-30 DIAGNOSIS — Z114 Encounter for screening for human immunodeficiency virus [HIV]: Secondary | ICD-10-CM

## 2017-05-30 DIAGNOSIS — Z1501 Genetic susceptibility to malignant neoplasm of breast: Secondary | ICD-10-CM

## 2017-05-30 DIAGNOSIS — E785 Hyperlipidemia, unspecified: Secondary | ICD-10-CM

## 2017-05-30 DIAGNOSIS — R29818 Other symptoms and signs involving the nervous system: Secondary | ICD-10-CM

## 2017-05-30 DIAGNOSIS — Z0000 Encounter for general adult medical examination without abnormal findings: Secondary | ICD-10-CM

## 2017-05-30 DIAGNOSIS — Z79899 Other long term (current) drug therapy: Secondary | ICD-10-CM

## 2017-05-30 DIAGNOSIS — G620 Drug-induced polyneuropathy: Secondary | ICD-10-CM

## 2017-05-30 DIAGNOSIS — R4189 Other symptoms and signs involving cognitive functions and awareness: Secondary | ICD-10-CM

## 2017-05-30 DIAGNOSIS — Z716 Tobacco abuse counseling: Secondary | ICD-10-CM

## 2017-05-30 DIAGNOSIS — F17200 Nicotine dependence, unspecified, uncomplicated: Secondary | ICD-10-CM

## 2017-05-30 LAB — T4, FREE: Free T4: 0.94 ng/dL (ref 0.60–1.60)

## 2017-05-30 LAB — LIPID PANEL
Cholesterol: 160 mg/dL (ref 0–200)
HDL: 49 mg/dL (ref >39.00)
LDL Cholesterol: 79 mg/dL (ref 0–99)
NonHDL: 110.58
Total CHOL/HDL Ratio: 3
Triglycerides: 159 mg/dL — ABNORMAL HIGH (ref 0.0–149.0)
VLDL: 31.8 mg/dL (ref 0.0–40.0)

## 2017-05-30 LAB — TSH: TSH: 5.12 u[IU]/mL — ABNORMAL HIGH (ref 0.35–4.50)

## 2017-05-30 MED ORDER — VARENICLINE TARTRATE 0.5 MG X 11 & 1 MG X 42 PO MISC: ORAL | 0 refills | Status: DC

## 2017-05-30 MED ORDER — VARENICLINE TARTRATE 0.5 MG PO TABS: 0.5000 mg | ORAL_TABLET | Freq: Two times a day (BID) | ORAL | 3 refills | Status: DC

## 2017-05-30 NOTE — Patient Instructions (Addendum)
continue ttry to  Stop tobacco   Lab today  No chane in  med but can try chantix again   rov in 1 year  Of all ok  Or fu in 3 months about tobacco  Continue working on balance exercise

## 2017-05-30 NOTE — Addendum Note (Signed)
Addended by: Tomi Likens on: 05/30/2017 04:50 PM   Modules accepted: Orders

## 2017-05-31 LAB — HEPATITIS C ANTIBODY
HEP C AB: NONREACTIVE
SIGNAL TO CUT-OFF: 0.02 (ref <1.00)

## 2017-05-31 LAB — HIV ANTIBODY (ROUTINE TESTING W REFLEX): HIV 1&2 Ab, 4th Generation: NONREACTIVE

## 2017-06-03 DIAGNOSIS — Z0289 Encounter for other administrative examinations: Secondary | ICD-10-CM

## 2017-06-11 ENCOUNTER — Other Ambulatory Visit: Payer: Self-pay | Admitting: Internal Medicine

## 2017-06-11 ENCOUNTER — Telehealth: Payer: Self-pay | Admitting: Internal Medicine

## 2017-06-11 DIAGNOSIS — R7989 Other specified abnormal findings of blood chemistry: Secondary | ICD-10-CM

## 2017-06-11 NOTE — Telephone Encounter (Signed)
See lab results.  Will close this encounter.

## 2017-06-11 NOTE — Telephone Encounter (Signed)
Copied from [place] [id number]. Topic: Quick Communication - Lab Results >> Jun 11, 2017  1:49 PM Darl Householder, RMA wrote: Patient is requesting lab results, please return call

## 2017-06-18 ENCOUNTER — Inpatient Hospital Stay: Payer: Medicare Other | Attending: Hematology & Oncology

## 2017-06-18 VITALS — BP 134/86 | HR 80 | Temp 97.6°F | Resp 18

## 2017-06-18 DIAGNOSIS — C801 Malignant (primary) neoplasm, unspecified: Secondary | ICD-10-CM

## 2017-06-18 DIAGNOSIS — Z452 Encounter for adjustment and management of vascular access device: Secondary | ICD-10-CM | POA: Diagnosis not present

## 2017-06-18 DIAGNOSIS — C9000 Multiple myeloma not having achieved remission: Secondary | ICD-10-CM | POA: Insufficient documentation

## 2017-06-18 MED ORDER — HEPARIN SOD (PORK) LOCK FLUSH 100 UNIT/ML IV SOLN
500.0000 [IU] | Freq: Once | INTRAVENOUS | Status: AC
  Administered 2017-06-18: 500 [IU] via INTRAVENOUS
  Filled 2017-06-18: qty 5

## 2017-06-18 MED ORDER — SODIUM CHLORIDE 0.9% FLUSH
10.0000 mL | INTRAVENOUS | Status: DC | PRN
  Administered 2017-06-18: 10 mL via INTRAVENOUS
  Filled 2017-06-18: qty 10

## 2017-06-18 NOTE — Patient Instructions (Signed)
Implanted Port Insertion, Care After °This sheet gives you information about how to care for yourself after your procedure. Your health care provider may also give you more specific instructions. If you have problems or questions, contact your health care provider. °What can I expect after the procedure? °After your procedure, it is common to have: °· Discomfort at the port insertion site. °· Bruising on the skin over the port. This should improve over 3-4 days. ° °Follow these instructions at home: °Port care °· After your port is placed, you will get a manufacturer's information card. The card has information about your port. Keep this card with you at all times. °· Take care of the port as told by your health care provider. Ask your health care provider if you or a family member can get training for taking care of the port at home. A home health care nurse may also take care of the port. °· Make sure to remember what type of port you have. °Incision care °· Follow instructions from your health care provider about how to take care of your port insertion site. Make sure you: °? Wash your hands with soap and water before you change your bandage (dressing). If soap and water are not available, use hand sanitizer. °? Change your dressing as told by your health care provider. °? Leave stitches (sutures), skin glue, or adhesive strips in place. These skin closures may need to stay in place for 2 weeks or longer. If adhesive strip edges start to loosen and curl up, you may trim the loose edges. Do not remove adhesive strips completely unless your health care provider tells you to do that. °· Check your port insertion site every day for signs of infection. Check for: °? More redness, swelling, or pain. °? More fluid or blood. °? Warmth. °? Pus or a bad smell. °General instructions °· Do not take baths, swim, or use a hot tub until your health care provider approves. °· Do not lift anything that is heavier than 10 lb (4.5  kg) for a week, or as told by your health care provider. °· Ask your health care provider when it is okay to: °? Return to work or school. °? Resume usual physical activities or sports. °· Do not drive for 24 hours if you were given a medicine to help you relax (sedative). °· Take over-the-counter and prescription medicines only as told by your health care provider. °· Wear a medical alert bracelet in case of an emergency. This will tell any health care providers that you have a port. °· Keep all follow-up visits as told by your health care provider. This is important. °Contact a health care provider if: °· You cannot flush your port with saline as directed, or you cannot draw blood from the port. °· You have a fever or chills. °· You have more redness, swelling, or pain around your port insertion site. °· You have more fluid or blood coming from your port insertion site. °· Your port insertion site feels warm to the touch. °· You have pus or a bad smell coming from the port insertion site. °Get help right away if: °· You have chest pain or shortness of breath. °· You have bleeding from your port that you cannot control. °Summary °· Take care of the port as told by your health care provider. °· Change your dressing as told by your health care provider. °· Keep all follow-up visits as told by your health care provider. °  This information is not intended to replace advice given to you by your health care provider. Make sure you discuss any questions you have with your health care provider. °Document Released: 02/17/2013 Document Revised: 03/20/2016 Document Reviewed: 03/20/2016 °Elsevier Interactive Patient Education © 2017 Elsevier Inc. ° °

## 2017-07-01 ENCOUNTER — Other Ambulatory Visit: Payer: Self-pay | Admitting: Internal Medicine

## 2017-07-01 ENCOUNTER — Other Ambulatory Visit: Payer: Self-pay | Admitting: Hematology & Oncology

## 2017-07-01 ENCOUNTER — Other Ambulatory Visit: Payer: Self-pay | Admitting: Neurology

## 2017-07-01 DIAGNOSIS — H8113 Benign paroxysmal vertigo, bilateral: Secondary | ICD-10-CM

## 2017-07-01 DIAGNOSIS — G4701 Insomnia due to medical condition: Secondary | ICD-10-CM

## 2017-07-01 DIAGNOSIS — G43109 Migraine with aura, not intractable, without status migrainosus: Secondary | ICD-10-CM

## 2017-07-01 DIAGNOSIS — H53143 Visual discomfort, bilateral: Secondary | ICD-10-CM

## 2017-07-02 NOTE — Telephone Encounter (Signed)
Pt requesting 90-day supply  Refill request for Medication: Norflex 16m - Sig: TAKE 1 TABLET BY MOUTH TWICE DAILY AS NEEDED FOR MUSCLE SPASMS Last Filled: 03/11/17, #180 x 0 refill Previous / Upcoming Appt: 05/30/17 CPX  Please advise Dr PRegis Bill thanks.

## 2017-07-21 DIAGNOSIS — M47816 Spondylosis without myelopathy or radiculopathy, lumbar region: Secondary | ICD-10-CM | POA: Diagnosis not present

## 2017-07-21 DIAGNOSIS — M25552 Pain in left hip: Secondary | ICD-10-CM | POA: Diagnosis not present

## 2017-07-22 DIAGNOSIS — M47816 Spondylosis without myelopathy or radiculopathy, lumbar region: Secondary | ICD-10-CM | POA: Diagnosis not present

## 2017-08-13 ENCOUNTER — Inpatient Hospital Stay: Payer: Medicare Other | Attending: Hematology & Oncology

## 2017-08-13 DIAGNOSIS — Z8589 Personal history of malignant neoplasm of other organs and systems: Secondary | ICD-10-CM | POA: Insufficient documentation

## 2017-08-13 DIAGNOSIS — Z452 Encounter for adjustment and management of vascular access device: Secondary | ICD-10-CM | POA: Insufficient documentation

## 2017-08-21 ENCOUNTER — Other Ambulatory Visit: Payer: Self-pay | Admitting: Internal Medicine

## 2017-08-21 ENCOUNTER — Other Ambulatory Visit: Payer: Self-pay | Admitting: Hematology & Oncology

## 2017-08-21 DIAGNOSIS — M25552 Pain in left hip: Secondary | ICD-10-CM | POA: Diagnosis not present

## 2017-08-26 ENCOUNTER — Inpatient Hospital Stay: Payer: Medicare Other

## 2017-08-26 DIAGNOSIS — Z8589 Personal history of malignant neoplasm of other organs and systems: Secondary | ICD-10-CM | POA: Diagnosis not present

## 2017-08-26 DIAGNOSIS — Z452 Encounter for adjustment and management of vascular access device: Secondary | ICD-10-CM | POA: Diagnosis not present

## 2017-08-26 DIAGNOSIS — Z95828 Presence of other vascular implants and grafts: Secondary | ICD-10-CM

## 2017-08-26 MED ORDER — SODIUM CHLORIDE 0.9 % IJ SOLN
10.0000 mL | INTRAMUSCULAR | Status: DC | PRN
  Administered 2017-08-26: 10 mL via INTRAVENOUS
  Filled 2017-08-26: qty 10

## 2017-08-26 MED ORDER — HEPARIN SOD (PORK) LOCK FLUSH 100 UNIT/ML IV SOLN
500.0000 [IU] | Freq: Once | INTRAVENOUS | Status: AC | PRN
  Administered 2017-08-26: 500 [IU] via INTRAVENOUS
  Filled 2017-08-26: qty 5

## 2017-09-20 ENCOUNTER — Other Ambulatory Visit: Payer: Self-pay | Admitting: Hematology & Oncology

## 2017-09-22 ENCOUNTER — Other Ambulatory Visit: Payer: Self-pay | Admitting: *Deleted

## 2017-09-22 MED ORDER — DULOXETINE HCL 60 MG PO CPEP: 60.0000 mg | ORAL_CAPSULE | Freq: Every day | ORAL | 1 refills | Status: DC

## 2017-09-30 ENCOUNTER — Telehealth: Payer: Self-pay | Admitting: Internal Medicine

## 2017-09-30 NOTE — Telephone Encounter (Unsigned)
Copied from [place] [id number]. Topic: Quick Communication - See Telephone Encounter >> Sep 30, 2017  2:48 PM Hewitt Shorts wrote: Pt is needing a refill on chantix-the original rx that was given was never filled and has been lost   [place]  [facility] [phone]

## 2017-10-01 ENCOUNTER — Other Ambulatory Visit: Payer: Self-pay | Admitting: *Deleted

## 2017-10-01 MED ORDER — VARENICLINE TARTRATE 0.5 MG X 11 & 1 MG X 42 PO MISC: ORAL | 0 refills | Status: DC

## 2017-10-01 MED ORDER — VARENICLINE TARTRATE 0.5 MG PO TABS: 0.5000 mg | ORAL_TABLET | Freq: Two times a day (BID) | ORAL | 3 refills | Status: DC

## 2017-10-01 NOTE — Telephone Encounter (Signed)
I called in Chantix starting pack to local [facility].

## 2017-10-01 NOTE — Telephone Encounter (Signed)
Call to office- starter Rx will be faxed by office- refilled continuation Rx for patient. Patient informed.

## 2017-10-15 ENCOUNTER — Inpatient Hospital Stay: Payer: Medicare Other | Attending: Hematology & Oncology | Admitting: Hematology & Oncology

## 2017-10-15 ENCOUNTER — Inpatient Hospital Stay: Payer: Medicare Other

## 2017-10-15 ENCOUNTER — Encounter: Payer: Self-pay | Admitting: Hematology & Oncology

## 2017-10-15 ENCOUNTER — Other Ambulatory Visit: Payer: Self-pay

## 2017-10-15 VITALS — BP 134/89 | HR 75 | Temp 97.9°F | Resp 20 | Wt 224.8 lb

## 2017-10-15 DIAGNOSIS — D509 Iron deficiency anemia, unspecified: Secondary | ICD-10-CM | POA: Insufficient documentation

## 2017-10-15 DIAGNOSIS — G629 Polyneuropathy, unspecified: Secondary | ICD-10-CM | POA: Insufficient documentation

## 2017-10-15 DIAGNOSIS — Z8589 Personal history of malignant neoplasm of other organs and systems: Secondary | ICD-10-CM | POA: Diagnosis not present

## 2017-10-15 DIAGNOSIS — D500 Iron deficiency anemia secondary to blood loss (chronic): Secondary | ICD-10-CM

## 2017-10-15 DIAGNOSIS — C801 Malignant (primary) neoplasm, unspecified: Secondary | ICD-10-CM

## 2017-10-15 DIAGNOSIS — Z95828 Presence of other vascular implants and grafts: Secondary | ICD-10-CM

## 2017-10-15 LAB — CBC WITH DIFFERENTIAL (CANCER CENTER ONLY)
BASOS ABS: 0 10*3/uL (ref 0.0–0.1)
Basophils Relative: 0 %
EOS ABS: 0.1 10*3/uL (ref 0.0–0.5)
Eosinophils Relative: 2 %
HCT: 39.2 % (ref 34.8–46.6)
Hemoglobin: 13.1 g/dL (ref 11.6–15.9)
Lymphocytes Relative: 26 %
Lymphs Abs: 1.5 10*3/uL (ref 0.9–3.3)
MCH: 28.9 pg (ref 26.0–34.0)
MCHC: 33.4 g/dL (ref 32.0–36.0)
MCV: 86.5 fL (ref 81.0–101.0)
Monocytes Absolute: 0.5 10*3/uL (ref 0.1–0.9)
Monocytes Relative: 8 %
Neutro Abs: 3.7 10*3/uL (ref 1.5–6.5)
Neutrophils Relative %: 64 %
PLATELETS: 214 10*3/uL (ref 145–400)
RBC: 4.53 MIL/uL (ref 3.70–5.32)
RDW: 14.3 % (ref 11.1–15.7)
WBC: 5.8 10*3/uL (ref 3.9–10.0)

## 2017-10-15 LAB — CMP (CANCER CENTER ONLY)
ALT: 31 U/L (ref 0–55)
AST: 28 U/L (ref 5–34)
Albumin: 3.8 g/dL (ref 3.5–5.0)
Alkaline Phosphatase: 116 U/L (ref 40–150)
Anion gap: 10 (ref 3–11)
BUN: 28 mg/dL — ABNORMAL HIGH (ref 7–26)
CHLORIDE: 101 mmol/L (ref 98–109)
CO2: 26 mmol/L (ref 22–29)
Calcium: 9.4 mg/dL (ref 8.4–10.4)
Creatinine: 1.01 mg/dL (ref 0.60–1.10)
GFR, Estimated: >60 mL/min (ref >60)
Glucose, Bld: 101 mg/dL (ref 70–140)
POTASSIUM: 3.6 mmol/L (ref 3.5–5.1)
SODIUM: 137 mmol/L (ref 136–145)
Total Bilirubin: 0.3 mg/dL (ref 0.2–1.2)
Total Protein: 6.7 g/dL (ref 6.4–8.3)

## 2017-10-15 MED ORDER — SODIUM CHLORIDE 0.9% FLUSH
10.0000 mL | INTRAVENOUS | Status: DC | PRN
  Administered 2017-10-15: 10 mL via INTRAVENOUS
  Filled 2017-10-15: qty 10

## 2017-10-15 MED ORDER — HEPARIN SOD (PORK) LOCK FLUSH 100 UNIT/ML IV SOLN
500.0000 [IU] | Freq: Once | INTRAVENOUS | Status: AC
  Administered 2017-10-15: 500 [IU] via INTRAVENOUS
  Filled 2017-10-15: qty 5

## 2017-10-15 NOTE — Patient Instructions (Signed)

## 2017-10-15 NOTE — Progress Notes (Signed)
Hematology and Oncology Follow Up Visit  [name] [id number] [birth year] 57 y.o. 10/15/2017   Principle Diagnosis:  1. 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 2. BRCA1 positive. 3. Severe chemotherapy-induced neuropathy. 4. Intermittent iron-deficiency anemia.  Current Therapy:    Observation     Interim History:  [name] is back for followup.  She is about to be a new grandmother.  She is excited about this.  Unfortunately, her new grand daughter will be out in [place].  She is under a lot of stress right now.  She is having her house remodeled.  She is living out of her bedroom and garage.  Healthwise, she seems to be doing pretty well.  She really has had no new complaints.  She does have the neuropathy which appears to be a little bit better.  She has had no fever.  She got through the winter and spring without any problems with infections.  She has had no weight loss or weight gain.  She is trying to exercise a little bit more but this is quite difficult.  Unfortunately, her oldest dog has cancer.  He has a mast cell cancer.  He has been getting radiation for this.  There is been no bleeding.  She is had no issues with bowels or bladder.  Overall, her performance status is ECOG 1.  Medications:  Current Outpatient Medications:  .  aspirin 81 MG tablet, Take 81 mg by mouth daily. 2 TABS IN AM, Disp: , Rfl:  .  butalbital-acetaminophen-caffeine (FIORICET, ESGIC) 50-325-40 MG tablet, TAKE 1 TABLET BY MOUTH EVERY 6 HOURS AS NEEDED FOR HEADACHE, Disp: 10 tablet, Rfl: 3 .  diclofenac (VOLTAREN) 75 MG EC tablet, TK 1 T PO BID AFTER MEALS FOR INFLAMMATION / PAIN / SWELLING PRN ONLY, Disp: , Rfl: 1 .  donepezil (ARICEPT) 10 MG tablet, TAKE 1 TABLET BY MOUTH EVERY MORNING, Disp: 90 tablet, Rfl: 0 .  DULoxetine (CYMBALTA) 60 MG capsule, Take 1 capsule (60 mg total) by mouth daily., Disp: 90 capsule, Rfl: 1 .  LYRICA 200 MG capsule, TAKE ONE  CAPSULE BY MOUTH TWICE DAILY, Disp: 60 capsule, Rfl: 0 .  Multiple Vitamin (MULTIVITAMIN WITH MINERALS) TABS tablet, Take 1 tablet by mouth daily., Disp: , Rfl:  .  orphenadrine (NORFLEX) 100 MG tablet, TAKE 1 TABLET BY MOUTH TWICE DAILY AS NEEDED FOR MUSCLE SPASMS, Disp: 180 tablet, Rfl: 0 .  potassium chloride (K-DUR) 10 MEQ tablet, TAKE 2 TABLETS BY MOUTH DAILY, Disp: 180 tablet, Rfl: 0 .  simvastatin (ZOCOR) 40 MG tablet, TAKE 1 TABLET BY MOUTH EVERY DAY, Disp: 90 tablet, Rfl: 0 .  triamterene-hydrochlorothiazide (MAXZIDE-25) 37.5-25 MG tablet, Take 2 tablets by mouth daily., Disp: 60 tablet, Rfl: 0 .  varenicline (CHANTIX) 0.5 MG tablet, Take 1 tablet (0.5 mg total) by mouth 2 (two) times daily., Disp: 60 tablet, Rfl: 3 .  Vitamin D, Ergocalciferol, (DRISDOL) 50000 units CAPS capsule, TAKE 1 CAPSULE BY MOUTH ONCE WEEKLY, Disp: 12 capsule, Rfl: 0 .  zolpidem (AMBIEN) 5 MG tablet, TAKE 1-2 TABLETS BY MOUTH AT BEDTIME AS NEEDED, Disp: 60 tablet, Rfl: 0 .  triamterene-hydrochlorothiazide (MAXZIDE-25) 37.5-25 MG tablet, TAKE 2 TABLETS BY MOUTH DAILY, Disp: 180 tablet, Rfl: 0 .  varenicline (CHANTIX STARTING MONTH PAK) 0.5 MG X 11 & 1 MG X 42 tablet, Take one 0.5 mg tablet by mouth once daily for 3 days, then increase to one 0.5 mg tablet twice daily for 4 days,  then increase to one 1 mg tablet twice daily., Disp: 53 tablet, Rfl: 0 No current facility-administered medications for this visit.   Facility-Administered Medications Ordered in Other Visits:  .  sodium chloride flush (NS) 0.9 % injection 10 mL, 10 mL, Intravenous, PRN, [place], Sarah M, NP, 10 mL at 10/15/17 1300  Allergies:  Allergies  Allergen Reactions  . Codeine Nausea Only    REACTION: Nausea  . Orange Itching and Rash    Past Medical History, Surgical history, Social history, and Family History were reviewed and updated.  Review of Systems: Review of Systems  Constitutional: Negative.   HENT: Negative.   Eyes:  Negative.   Respiratory: Negative.   Cardiovascular: Negative.   Gastrointestinal: Negative.   Genitourinary: Negative.   Musculoskeletal: Positive for joint pain and myalgias.  Skin: Negative.   Neurological: Positive for tingling.  Endo/Heme/Allergies: Negative.   Psychiatric/Behavioral: Negative.      Physical Exam:  weight is 224 lb 12.8 oz (102 kg). Her oral temperature is 97.9 F (36.6 C). Her blood pressure is 134/89 and her pulse is 75. Her respiration is 20 and oxygen saturation is 97%.   Physical Exam  Constitutional: She is oriented to person, place, and time.  HENT:  Head: Normocephalic and atraumatic.  Mouth/Throat: Oropharynx is clear and moist.  Eyes: Pupils are equal, round, and reactive to light. EOM are normal.  Neck: Normal range of motion.  Cardiovascular: Normal rate, regular rhythm and normal heart sounds.  Pulmonary/Chest: Effort normal and breath sounds normal.  Abdominal: Soft. Bowel sounds are normal.  Musculoskeletal: Normal range of motion. She exhibits no edema, tenderness or deformity.  Lymphadenopathy:    She has no cervical adenopathy.  Neurological: She is alert and oriented to person, place, and time.  Skin: Skin is warm and dry. No rash noted. No erythema.  Psychiatric: She has a normal mood and affect. Her behavior is normal. Judgment and thought content normal.  Vitals reviewed.    Lab Results  Component Value Date   WBC 5.8 10/15/2017   HGB 13.1 10/15/2017   HCT 39.2 10/15/2017   MCV 86.5 10/15/2017   PLT 214 10/15/2017     Chemistry      Component Value Date/Time   NA 147 (H) 04/16/2017 1152   NA 139 12/27/2015 1156   K 3.4 04/16/2017 1152   K 3.9 12/27/2015 1156   CL 101 04/16/2017 1152   CO2 30 04/16/2017 1152   CO2 27 12/27/2015 1156   BUN 18 04/16/2017 1152   BUN 14.9 12/27/2015 1156   CREATININE 1.2 04/16/2017 1152   CREATININE 0.9 12/27/2015 1156      Component Value Date/Time   CALCIUM 9.8 04/16/2017 1152    CALCIUM 9.7 12/27/2015 1156   ALKPHOS 87 (H) 04/16/2017 1152   ALKPHOS 106 12/27/2015 1156   AST 27 04/16/2017 1152   AST 29 12/27/2015 1156   ALT 26 04/16/2017 1152   ALT 28 12/27/2015 1156   BILITOT 0.60 04/16/2017 1152   BILITOT 0.33 12/27/2015 1156         Impression and Plan: [name] is 57 year old African-American female. She has a remote history of a poorly differentiated carcinoma which was felt to be ovarian. Of note she is BRCA1 (+). She had 6 cycles of chemotherapy with carboplatin/Taxotere. She has now been in remission for close to 8 1/2 years.  Everything is holding pretty steady from my point of view.  I do not see any evidence of recurrent  malignancy.   We will plan to get her back in 6 months.  She has a Port-A-Cath that we will flush very 2 months.  I look forward to seeing pictures of her new granddaughter.  I will share pictures of my new granddaughter with her.  [name], MD 6/5/20193:40 PM

## 2017-10-16 ENCOUNTER — Telehealth: Payer: Self-pay | Admitting: *Deleted

## 2017-10-16 LAB — IRON AND TIBC
Iron: 62 ug/dL (ref 41–142)
Saturation Ratios: 18 % — ABNORMAL LOW (ref 21–57)
TIBC: 334 ug/dL (ref 236–444)
UIBC: 272 ug/dL

## 2017-10-16 LAB — FERRITIN: FERRITIN: 23 ng/mL (ref 9–269)

## 2017-10-16 NOTE — Telephone Encounter (Addendum)
Patient aware of results. Appointment made.   ----- Message from [name], MD sent at 10/16/2017  9:50 AM EDT ----- Call - iron is low.  plese set up a dose of Feraheme.  [name]

## 2017-10-20 ENCOUNTER — Other Ambulatory Visit: Payer: Self-pay

## 2017-10-20 ENCOUNTER — Inpatient Hospital Stay: Payer: Medicare Other

## 2017-10-20 VITALS — BP 133/88 | HR 86 | Temp 98.6°F | Resp 18

## 2017-10-20 DIAGNOSIS — G63 Polyneuropathy in diseases classified elsewhere: Secondary | ICD-10-CM

## 2017-10-20 DIAGNOSIS — D509 Iron deficiency anemia, unspecified: Secondary | ICD-10-CM | POA: Diagnosis not present

## 2017-10-20 DIAGNOSIS — Z95828 Presence of other vascular implants and grafts: Secondary | ICD-10-CM

## 2017-10-20 DIAGNOSIS — G629 Polyneuropathy, unspecified: Secondary | ICD-10-CM | POA: Diagnosis not present

## 2017-10-20 DIAGNOSIS — Z8589 Personal history of malignant neoplasm of other organs and systems: Secondary | ICD-10-CM | POA: Diagnosis not present

## 2017-10-20 DIAGNOSIS — C801 Malignant (primary) neoplasm, unspecified: Secondary | ICD-10-CM

## 2017-10-20 DIAGNOSIS — D500 Iron deficiency anemia secondary to blood loss (chronic): Secondary | ICD-10-CM

## 2017-10-20 MED ORDER — HEPARIN SOD (PORK) LOCK FLUSH 100 UNIT/ML IV SOLN
500.0000 [IU] | Freq: Once | INTRAVENOUS | Status: AC | PRN
  Administered 2017-10-20: 500 [IU] via INTRAVENOUS
  Filled 2017-10-20: qty 5

## 2017-10-20 MED ORDER — SODIUM CHLORIDE 0.9 % IV SOLN
510.0000 mg | Freq: Once | INTRAVENOUS | Status: AC
  Administered 2017-10-20: 510 mg via INTRAVENOUS
  Filled 2017-10-20: qty 17

## 2017-10-20 MED ORDER — SODIUM CHLORIDE 0.9 % IJ SOLN
10.0000 mL | INTRAMUSCULAR | Status: DC | PRN
  Administered 2017-10-20: 10 mL via INTRAVENOUS
  Filled 2017-10-20: qty 10

## 2017-10-20 NOTE — Progress Notes (Signed)
Patient refuses to stay for the 30 minute post Feraheme observation. Patient discharged ambulatory without complaints or concerns.

## 2017-10-20 NOTE — Patient Instructions (Signed)

## 2017-11-01 ENCOUNTER — Other Ambulatory Visit: Payer: Self-pay | Admitting: Family

## 2017-11-01 ENCOUNTER — Other Ambulatory Visit: Payer: Self-pay | Admitting: Hematology & Oncology

## 2017-11-03 ENCOUNTER — Other Ambulatory Visit: Payer: Self-pay | Admitting: *Deleted

## 2017-11-03 MED ORDER — ZOLPIDEM TARTRATE 5 MG PO TABS: ORAL_TABLET | ORAL | 0 refills | Status: DC

## 2017-12-08 ENCOUNTER — Other Ambulatory Visit: Payer: Self-pay | Admitting: Neurology

## 2017-12-08 ENCOUNTER — Other Ambulatory Visit: Payer: Self-pay | Admitting: Internal Medicine

## 2017-12-08 ENCOUNTER — Other Ambulatory Visit: Payer: Self-pay | Admitting: Family

## 2017-12-08 DIAGNOSIS — G4701 Insomnia due to medical condition: Secondary | ICD-10-CM

## 2017-12-08 DIAGNOSIS — H8113 Benign paroxysmal vertigo, bilateral: Secondary | ICD-10-CM

## 2017-12-08 DIAGNOSIS — H53143 Visual discomfort, bilateral: Secondary | ICD-10-CM

## 2017-12-08 DIAGNOSIS — G43109 Migraine with aura, not intractable, without status migrainosus: Secondary | ICD-10-CM

## 2017-12-15 ENCOUNTER — Telehealth: Payer: Self-pay | Admitting: Hematology & Oncology

## 2017-12-15 NOTE — Telephone Encounter (Signed)
Flush appointment r/s from 8/7 per patient request

## 2017-12-17 ENCOUNTER — Other Ambulatory Visit: Payer: Medicare Other

## 2017-12-28 ENCOUNTER — Other Ambulatory Visit: Payer: Self-pay | Admitting: Family

## 2017-12-30 ENCOUNTER — Inpatient Hospital Stay: Payer: Medicare Other | Attending: Hematology & Oncology

## 2017-12-30 ENCOUNTER — Other Ambulatory Visit: Payer: Self-pay | Admitting: Hematology & Oncology

## 2017-12-30 DIAGNOSIS — Z8589 Personal history of malignant neoplasm of other organs and systems: Secondary | ICD-10-CM | POA: Insufficient documentation

## 2017-12-30 DIAGNOSIS — Z452 Encounter for adjustment and management of vascular access device: Secondary | ICD-10-CM | POA: Insufficient documentation

## 2017-12-31 DIAGNOSIS — M47816 Spondylosis without myelopathy or radiculopathy, lumbar region: Secondary | ICD-10-CM | POA: Diagnosis not present

## 2018-01-08 ENCOUNTER — Inpatient Hospital Stay: Payer: Medicare Other

## 2018-01-08 ENCOUNTER — Telehealth: Payer: Self-pay | Admitting: Hematology & Oncology

## 2018-01-08 DIAGNOSIS — M25552 Pain in left hip: Secondary | ICD-10-CM | POA: Diagnosis not present

## 2018-01-08 DIAGNOSIS — Z452 Encounter for adjustment and management of vascular access device: Secondary | ICD-10-CM | POA: Diagnosis not present

## 2018-01-08 DIAGNOSIS — Z8589 Personal history of malignant neoplasm of other organs and systems: Secondary | ICD-10-CM | POA: Diagnosis not present

## 2018-01-08 DIAGNOSIS — Z95828 Presence of other vascular implants and grafts: Secondary | ICD-10-CM

## 2018-01-08 MED ORDER — HEPARIN SOD (PORK) LOCK FLUSH 100 UNIT/ML IV SOLN
500.0000 [IU] | Freq: Once | INTRAVENOUS | Status: AC
  Administered 2018-01-08: 500 [IU] via INTRAVENOUS
  Filled 2018-01-08: qty 5

## 2018-01-08 MED ORDER — SODIUM CHLORIDE 0.9% FLUSH
10.0000 mL | INTRAVENOUS | Status: DC | PRN
  Administered 2018-01-08: 10 mL via INTRAVENOUS
  Filled 2018-01-08: qty 10

## 2018-01-08 NOTE — Telephone Encounter (Signed)
Dr. 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 Added to schedule ok per flush nurse in [facility].

## 2018-01-19 ENCOUNTER — Encounter: Payer: Self-pay | Admitting: Hematology & Oncology

## 2018-01-21 ENCOUNTER — Other Ambulatory Visit: Payer: Self-pay | Admitting: *Deleted

## 2018-01-21 ENCOUNTER — Other Ambulatory Visit: Payer: Self-pay | Admitting: Internal Medicine

## 2018-01-21 MED ORDER — PREGABALIN 200 MG PO CAPS: 200.0000 mg | ORAL_CAPSULE | Freq: Two times a day (BID) | ORAL | 3 refills | Status: DC

## 2018-01-30 ENCOUNTER — Other Ambulatory Visit: Payer: Self-pay | Admitting: Hematology & Oncology

## 2018-01-30 ENCOUNTER — Telehealth: Payer: Self-pay | Admitting: *Deleted

## 2018-01-30 NOTE — Telephone Encounter (Signed)
Call received from patient requesting a refill for Ambien and Norflex. Pt to obtain refill for Norflex from Dr. Regis Bill per Dr. Marin Olp, ok for refill for Ambien per Dr. Marin Olp.  Call placed back to patient and patient notified of Dr. Antonieta Pert orders.  Patient appreciative of call back.

## 2018-02-18 ENCOUNTER — Inpatient Hospital Stay: Payer: Medicare Other | Attending: Hematology & Oncology

## 2018-02-18 ENCOUNTER — Other Ambulatory Visit: Payer: Self-pay

## 2018-02-18 ENCOUNTER — Other Ambulatory Visit: Payer: Self-pay | Admitting: *Deleted

## 2018-02-18 ENCOUNTER — Other Ambulatory Visit: Payer: Medicare Other

## 2018-02-18 VITALS — BP 133/88 | HR 87 | Temp 98.0°F | Resp 18

## 2018-02-18 DIAGNOSIS — Z452 Encounter for adjustment and management of vascular access device: Secondary | ICD-10-CM | POA: Diagnosis not present

## 2018-02-18 DIAGNOSIS — Z95828 Presence of other vascular implants and grafts: Secondary | ICD-10-CM

## 2018-02-18 DIAGNOSIS — Z8589 Personal history of malignant neoplasm of other organs and systems: Secondary | ICD-10-CM | POA: Diagnosis not present

## 2018-02-18 MED ORDER — HEPARIN SOD (PORK) LOCK FLUSH 100 UNIT/ML IV SOLN
500.0000 [IU] | Freq: Once | INTRAVENOUS | Status: AC
  Administered 2018-02-18: 500 [IU] via INTRAVENOUS
  Filled 2018-02-18: qty 5

## 2018-02-18 MED ORDER — ACETAMINOPHEN 325 MG PO TABS: 650.0000 mg | ORAL_TABLET | Freq: Once | ORAL | Status: DC

## 2018-02-18 MED ORDER — SODIUM CHLORIDE 0.9% FLUSH
10.0000 mL | INTRAVENOUS | Status: DC | PRN
  Administered 2018-02-18: 10 mL via INTRAVENOUS
  Filled 2018-02-18: qty 10

## 2018-02-18 NOTE — Progress Notes (Signed)
ty

## 2018-03-03 ENCOUNTER — Other Ambulatory Visit: Payer: Self-pay | Admitting: Internal Medicine

## 2018-03-03 ENCOUNTER — Other Ambulatory Visit: Payer: Self-pay | Admitting: Hematology & Oncology

## 2018-03-03 ENCOUNTER — Other Ambulatory Visit: Payer: Self-pay | Admitting: Neurology

## 2018-03-03 DIAGNOSIS — G4701 Insomnia due to medical condition: Secondary | ICD-10-CM

## 2018-03-03 DIAGNOSIS — G43109 Migraine with aura, not intractable, without status migrainosus: Secondary | ICD-10-CM

## 2018-03-03 DIAGNOSIS — H53143 Visual discomfort, bilateral: Secondary | ICD-10-CM

## 2018-03-03 DIAGNOSIS — H8113 Benign paroxysmal vertigo, bilateral: Secondary | ICD-10-CM

## 2018-03-11 ENCOUNTER — Telehealth: Payer: Self-pay | Admitting: *Deleted

## 2018-03-11 NOTE — Telephone Encounter (Signed)
Prior auth for Orphenadrine Citrate 173m ER tablets sent to [email or web address]-key [id number].

## 2018-03-16 NOTE — Telephone Encounter (Signed)
Error in previous message-Rx denial sent to Dr Velora Mediate asst.

## 2018-03-16 NOTE — Telephone Encounter (Signed)
I would not know what is on formulary for muscle relaxant   See if methocarbamol is covered

## 2018-03-16 NOTE — Telephone Encounter (Signed)
Denial letter received.  Denial section highlighted which gives reason for denial  Please advise if another similar medication can be prescribed. Thanks.  Form is in red folder.

## 2018-03-16 NOTE — Telephone Encounter (Signed)
Fax received from [facility] stating the request was denied and this was given to Dr Doug Sou asst.

## 2018-03-27 NOTE — Telephone Encounter (Signed)
Spoke with [employer], states that With the insurance the patient has these types of medications are not covered and the patient will have to most likely pay out of pocket for Muscle Relaxers. The Cheapest might be the Flexeril, Qty #30 Sig: 1 TID PRN $2.60 per Rx  Please advise Dr Regis Bill, thanks.

## 2018-04-05 ENCOUNTER — Other Ambulatory Visit: Payer: Self-pay | Admitting: Family

## 2018-04-14 DIAGNOSIS — Z0271 Encounter for disability determination: Secondary | ICD-10-CM

## 2018-04-15 ENCOUNTER — Other Ambulatory Visit: Payer: Self-pay | Admitting: *Deleted

## 2018-04-15 ENCOUNTER — Inpatient Hospital Stay: Payer: Medicare Other

## 2018-04-15 ENCOUNTER — Inpatient Hospital Stay: Payer: Medicare Other | Attending: Hematology & Oncology | Admitting: Hematology & Oncology

## 2018-04-15 VITALS — BP 157/54 | HR 82 | Temp 98.0°F | Resp 18 | Ht 67.91 in | Wt 226.1 lb

## 2018-04-15 DIAGNOSIS — G629 Polyneuropathy, unspecified: Secondary | ICD-10-CM | POA: Diagnosis not present

## 2018-04-15 DIAGNOSIS — Z95828 Presence of other vascular implants and grafts: Secondary | ICD-10-CM

## 2018-04-15 DIAGNOSIS — D500 Iron deficiency anemia secondary to blood loss (chronic): Secondary | ICD-10-CM

## 2018-04-15 DIAGNOSIS — C801 Malignant (primary) neoplasm, unspecified: Secondary | ICD-10-CM

## 2018-04-15 DIAGNOSIS — R413 Other amnesia: Secondary | ICD-10-CM | POA: Insufficient documentation

## 2018-04-15 DIAGNOSIS — Z720 Tobacco use: Secondary | ICD-10-CM | POA: Insufficient documentation

## 2018-04-15 DIAGNOSIS — Z8589 Personal history of malignant neoplasm of other organs and systems: Secondary | ICD-10-CM | POA: Diagnosis not present

## 2018-04-15 LAB — CMP (CANCER CENTER ONLY)
ALK PHOS: 104 U/L (ref 38–126)
ALT: 29 U/L (ref 0–44)
ANION GAP: 8 (ref 5–15)
AST: 26 U/L (ref 15–41)
Albumin: 4.1 g/dL (ref 3.5–5.0)
BILIRUBIN TOTAL: 0.4 mg/dL (ref 0.3–1.2)
BUN: 20 mg/dL (ref 6–20)
CALCIUM: 9.4 mg/dL (ref 8.9–10.3)
CO2: 29 mmol/L (ref 22–32)
Chloride: 101 mmol/L (ref 98–111)
Creatinine: 0.95 mg/dL (ref 0.44–1.00)
Glucose, Bld: 80 mg/dL (ref 70–99)
Potassium: 3.7 mmol/L (ref 3.5–5.1)
SODIUM: 138 mmol/L (ref 135–145)
TOTAL PROTEIN: 6.4 g/dL — AB (ref 6.5–8.1)

## 2018-04-15 LAB — CBC WITH DIFFERENTIAL (CANCER CENTER ONLY)
Abs Immature Granulocytes: 0.02 10*3/uL (ref 0.00–0.07)
Basophils Absolute: 0 10*3/uL (ref 0.0–0.1)
Basophils Relative: 0 %
EOS PCT: 2 %
Eosinophils Absolute: 0.1 10*3/uL (ref 0.0–0.5)
HCT: 43.5 % (ref 36.0–46.0)
HEMOGLOBIN: 13.7 g/dL (ref 12.0–15.0)
Immature Granulocytes: 0 %
LYMPHS PCT: 30 %
Lymphs Abs: 1.7 10*3/uL (ref 0.7–4.0)
MCH: 28.5 pg (ref 26.0–34.0)
MCHC: 31.5 g/dL (ref 30.0–36.0)
MCV: 90.4 fL (ref 80.0–100.0)
MONO ABS: 0.4 10*3/uL (ref 0.1–1.0)
Monocytes Relative: 8 %
Neutro Abs: 3.4 10*3/uL (ref 1.7–7.7)
Neutrophils Relative %: 60 %
Platelet Count: 232 10*3/uL (ref 150–400)
RBC: 4.81 MIL/uL (ref 3.87–5.11)
RDW: 14.6 % (ref 11.5–15.5)
WBC: 5.7 10*3/uL (ref 4.0–10.5)
nRBC: 0 % (ref 0.0–0.2)

## 2018-04-15 MED ORDER — SODIUM CHLORIDE 0.9% FLUSH
10.0000 mL | INTRAVENOUS | Status: DC | PRN
  Administered 2018-04-15: 10 mL via INTRAVENOUS
  Filled 2018-04-15: qty 10

## 2018-04-15 MED ORDER — VARENICLINE TARTRATE 1 MG PO TABS: 1.0000 mg | ORAL_TABLET | Freq: Two times a day (BID) | ORAL | 3 refills | Status: DC

## 2018-04-15 MED ORDER — VARENICLINE TARTRATE 0.5 MG X 11 & 1 MG X 42 PO MISC: ORAL | 0 refills | Status: DC

## 2018-04-15 MED ORDER — HEPARIN SOD (PORK) LOCK FLUSH 100 UNIT/ML IV SOLN
500.0000 [IU] | Freq: Once | INTRAVENOUS | Status: AC
  Administered 2018-04-15: 500 [IU] via INTRAVENOUS
  Filled 2018-04-15: qty 5

## 2018-04-15 NOTE — Progress Notes (Signed)
Hematology and Oncology Follow Up Visit  [name] [id number] 11/20/1960 57 y.o. 04/15/2018   Principle Diagnosis:  1. 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 2. BRCA1 positive. 3. Severe chemotherapy-induced neuropathy. 4. Intermittent iron-deficiency anemia.  Current Therapy:    Observation     Interim History:  Ms.  [name] is back for followup.  She is having more neurological issues.  She says that over the past couple days her memory has gotten worse.  I am not sure how do explain that part.  She also says that her neuropathy is getting worse.  She is on both Lyrica and Cymbalta.  I know that she sees neurology.  I think she sees her neurologist in January.  She is now a new grandmother.  She had a granddaughter born in August.  She goes out to [place] every couple months to see her.  She is worried about a couple lesions on her skin.  I looked at these.  One in the supra pubic area looks like a seborrheic keratosis.  On the right side of her face, it looks like a solar keratosis.  She has started smoking again.  Apparently, she was doing some home remodeling and started smoking again.  She wants to stop.  I will send in a prescription for Chantix.  Her last colonoscopy I think was back in 2004.  She really needs to have another one done.  She is not sure when she had her last mammogram.    Overall, her performance status is ECOG 1.  Medications:  Current Outpatient Medications:  .  aspirin 81 MG tablet, Take 81 mg by mouth daily. 2 TABS IN AM, Disp: , Rfl:  .  butalbital-acetaminophen-caffeine (FIORICET, ESGIC) 50-325-40 MG tablet, TAKE 1 TABLET BY MOUTH EVERY 6 HOURS AS NEEDED FOR HEADACHE, Disp: 10 tablet, Rfl: 3 .  diclofenac (VOLTAREN) 75 MG EC tablet, TK 1 T PO BID AFTER MEALS FOR INFLAMMATION / PAIN / SWELLING PRN ONLY, Disp: , Rfl: 1 .  donepezil (ARICEPT) 10 MG tablet, TAKE 1 TABLET BY MOUTH EVERY MORNING, Disp: 90 tablet, Rfl: 1 .   DULoxetine (CYMBALTA) 60 MG capsule, TAKE ONE CAPSULE BY MOUTH DAILY, Disp: 90 capsule, Rfl: 0 .  Multiple Vitamin (MULTIVITAMIN WITH MINERALS) TABS tablet, Take 1 tablet by mouth daily., Disp: , Rfl:  .  orphenadrine (NORFLEX) 100 MG tablet, TAKE 1 TABLET BY MOUTH TWICE DAILY AS NEEDED FOR MUSCLE SPASMS, Disp: 180 tablet, Rfl: 0 .  potassium chloride (K-DUR) 10 MEQ tablet, TAKE 2 TABLETS BY MOUTH DAILY, Disp: 180 tablet, Rfl: 0 .  pregabalin (LYRICA) 200 MG capsule, Take 1 capsule (200 mg total) by mouth 2 (two) times daily., Disp: 60 capsule, Rfl: 3 .  simvastatin (ZOCOR) 40 MG tablet, TAKE 1 TABLET BY MOUTH EVERY DAY, Disp: 90 tablet, Rfl: 0 .  triamterene-hydrochlorothiazide (MAXZIDE-25) 37.5-25 MG tablet, TAKE 2 TABLETS BY MOUTH DAILY, Disp: 180 tablet, Rfl: 0 .  varenicline (CHANTIX STARTING MONTH PAK) 0.5 MG X 11 & 1 MG X 42 tablet, Take one 0.5 mg tablet by mouth once daily for 3 days, then increase to one 0.5 mg tablet twice daily for 4 days, then increase to one 1 mg tablet twice daily., Disp: 53 tablet, Rfl: 0 .  varenicline (CHANTIX) 0.5 MG tablet, Take 1 tablet (0.5 mg total) by mouth 2 (two) times daily., Disp: 60 tablet, Rfl: 3 .  Vitamin D, Ergocalciferol, (DRISDOL) 50000 units CAPS capsule, TAKE 1  CAPSULE BY MOUTH ONCE WEEKLY, Disp: 12 capsule, Rfl: 0 .  zolpidem (AMBIEN) 5 MG tablet, TAKE 1-2 TABLETS BY MOUTH AT BEDTIME AS NEEDED, Disp: 60 tablet, Rfl: 0 .  zolpidem (AMBIEN) 5 MG tablet, TAKE 1 TO 2 TABLETS BY MOUTH AT BEDTIME AS NEEDED, Disp: 60 tablet, Rfl: 0 No current facility-administered medications for this visit.   Facility-Administered Medications Ordered in Other Visits:  .  sodium chloride flush (NS) 0.9 % injection 10 mL, 10 mL, Intravenous, PRN, [name], MD, 10 mL at 04/15/18 1234  Allergies:  Allergies  Allergen Reactions  . Codeine Nausea Only  . Orange Itching and Rash    Past Medical History, Surgical history, Social history, and Family History  were reviewed and updated.  Review of Systems: Review of Systems  Constitutional: Negative.   HENT: Negative.   Eyes: Negative.   Respiratory: Negative.   Cardiovascular: Negative.   Gastrointestinal: Negative.   Genitourinary: Negative.   Musculoskeletal: Positive for joint pain and myalgias.  Skin: Negative.   Neurological: Positive for tingling.  Endo/Heme/Allergies: Negative.   Psychiatric/Behavioral: Negative.      Physical Exam:  height is 5' 7.91" (1.725 m) and weight is 226 lb 1.6 oz (102.6 kg). Her oral temperature is 98 F (36.7 C). Her blood pressure is 157/54 (abnormal) and her pulse is 82. Her respiration is 18 and oxygen saturation is 100%.   Physical Exam  Constitutional: She is oriented to person, place, and time.  HENT:  Head: Normocephalic and atraumatic.  Mouth/Throat: Oropharynx is clear and moist.  Eyes: Pupils are equal, round, and reactive to light. EOM are normal.  Neck: Normal range of motion.  Cardiovascular: Normal rate, regular rhythm and normal heart sounds.  Pulmonary/Chest: Effort normal and breath sounds normal.  Abdominal: Soft. Bowel sounds are normal.  Musculoskeletal: Normal range of motion. She exhibits no edema, tenderness or deformity.  Lymphadenopathy:    She has no cervical adenopathy.  Neurological: She is alert and oriented to person, place, and time.  Skin: Skin is warm and dry. No rash noted. No erythema.  Psychiatric: She has a normal mood and affect. Her behavior is normal. Judgment and thought content normal.  Vitals reviewed.    Lab Results  Component Value Date   WBC 5.7 04/15/2018   HGB 13.7 04/15/2018   HCT 43.5 04/15/2018   MCV 90.4 04/15/2018   PLT 232 04/15/2018     Chemistry      Component Value Date/Time   NA 138 04/15/2018 1200   NA 147 (H) 04/16/2017 1152   NA 139 12/27/2015 1156   K 3.7 04/15/2018 1200   K 3.4 04/16/2017 1152   K 3.9 12/27/2015 1156   CL 101 04/15/2018 1200   CL 101 04/16/2017  1152   CO2 29 04/15/2018 1200   CO2 30 04/16/2017 1152   CO2 27 12/27/2015 1156   BUN 20 04/15/2018 1200   BUN 18 04/16/2017 1152   BUN 14.9 12/27/2015 1156   CREATININE 0.95 04/15/2018 1200   CREATININE 1.2 04/16/2017 1152   CREATININE 0.9 12/27/2015 1156      Component Value Date/Time   CALCIUM 9.4 04/15/2018 1200   CALCIUM 9.8 04/16/2017 1152   CALCIUM 9.7 12/27/2015 1156   ALKPHOS 104 04/15/2018 1200   ALKPHOS 87 (H) 04/16/2017 1152   ALKPHOS 106 12/27/2015 1156   AST 26 04/15/2018 1200   AST 29 12/27/2015 1156   ALT 29 04/15/2018 1200   ALT 26 04/16/2017 1152  ALT 28 12/27/2015 1156   BILITOT 0.4 04/15/2018 1200   BILITOT 0.33 12/27/2015 1156         Impression and Plan: [name] is 57 year old African-American female. She has a remote history of a poorly differentiated carcinoma which was felt to be ovarian. Of note she is BRCA1 (+). She had 6 cycles of chemotherapy with carboplatin/Taxotere. She has now been in remission for close to 9 years.  I am sure the neurologist will help with her memory problems.  Again I doubt that this is from the chemotherapy.  She had chemotherapy over 9 years ago.  Hopefully, she will stop smoking.  I will see her back in another 6 months.  We will have to make the referral for her colonoscopy.  Marland Kitchen [name], MD 12/4/20191:03 PM

## 2018-04-15 NOTE — Patient Instructions (Signed)
Implanted Port Insertion, Care After °This sheet gives you information about how to care for yourself after your procedure. Your health care provider may also give you more specific instructions. If you have problems or questions, contact your health care provider. °What can I expect after the procedure? °After your procedure, it is common to have: °· Discomfort at the port insertion site. °· Bruising on the skin over the port. This should improve over 3-4 days. ° °Follow these instructions at home: °Port care °· After your port is placed, you will get a manufacturer's information card. The card has information about your port. Keep this card with you at all times. °· Take care of the port as told by your health care provider. Ask your health care provider if you or a family member can get training for taking care of the port at home. A home health care nurse may also take care of the port. °· Make sure to remember what type of port you have. °Incision care °· Follow instructions from your health care provider about how to take care of your port insertion site. Make sure you: °? Wash your hands with soap and water before you change your bandage (dressing). If soap and water are not available, use hand sanitizer. °? Change your dressing as told by your health care provider. °? Leave stitches (sutures), skin glue, or adhesive strips in place. These skin closures may need to stay in place for 2 weeks or longer. If adhesive strip edges start to loosen and curl up, you may trim the loose edges. Do not remove adhesive strips completely unless your health care provider tells you to do that. °· Check your port insertion site every day for signs of infection. Check for: °? More redness, swelling, or pain. °? More fluid or blood. °? Warmth. °? Pus or a bad smell. °General instructions °· Do not take baths, swim, or use a hot tub until your health care provider approves. °· Do not lift anything that is heavier than 10 lb (4.5  kg) for a week, or as told by your health care provider. °· Ask your health care provider when it is okay to: °? Return to work or school. °? Resume usual physical activities or sports. °· Do not drive for 24 hours if you were given a medicine to help you relax (sedative). °· Take over-the-counter and prescription medicines only as told by your health care provider. °· Wear a medical alert bracelet in case of an emergency. This will tell any health care providers that you have a port. °· Keep all follow-up visits as told by your health care provider. This is important. °Contact a health care provider if: °· You cannot flush your port with saline as directed, or you cannot draw blood from the port. °· You have a fever or chills. °· You have more redness, swelling, or pain around your port insertion site. °· You have more fluid or blood coming from your port insertion site. °· Your port insertion site feels warm to the touch. °· You have pus or a bad smell coming from the port insertion site. °Get help right away if: °· You have chest pain or shortness of breath. °· You have bleeding from your port that you cannot control. °Summary °· Take care of the port as told by your health care provider. °· Change your dressing as told by your health care provider. °· Keep all follow-up visits as told by your health care provider. °  This information is not intended to replace advice given to you by your health care provider. Make sure you discuss any questions you have with your health care provider. °Document Released: 02/17/2013 Document Revised: 03/20/2016 Document Reviewed: 03/20/2016 °Elsevier Interactive Patient Education © 2017 Elsevier Inc. ° °

## 2018-04-16 ENCOUNTER — Encounter: Payer: Self-pay | Admitting: *Deleted

## 2018-04-16 ENCOUNTER — Telehealth: Payer: Self-pay | Admitting: Hematology & Oncology

## 2018-04-16 LAB — IRON AND TIBC
IRON: 73 ug/dL (ref 41–142)
Saturation Ratios: 24 % (ref 21–57)
TIBC: 308 ug/dL (ref 236–444)
UIBC: 234 ug/dL (ref 120–384)

## 2018-04-16 LAB — FERRITIN: FERRITIN: 76 ng/mL (ref 11–307)

## 2018-04-16 NOTE — Telephone Encounter (Signed)
Referral placed in [facility] and [facility] will contact patient to schedule appointment

## 2018-05-07 ENCOUNTER — Encounter: Payer: Self-pay | Admitting: Internal Medicine

## 2018-05-09 ENCOUNTER — Other Ambulatory Visit: Payer: Self-pay | Admitting: Internal Medicine

## 2018-05-09 ENCOUNTER — Other Ambulatory Visit: Payer: Self-pay | Admitting: Family

## 2018-05-11 ENCOUNTER — Other Ambulatory Visit: Payer: Self-pay | Admitting: Internal Medicine

## 2018-05-13 HISTORY — PX: BREAST BIOPSY: SHX20

## 2018-05-26 ENCOUNTER — Ambulatory Visit: Payer: Medicare Other | Admitting: Neurology

## 2018-05-26 ENCOUNTER — Encounter: Payer: Self-pay | Admitting: Neurology

## 2018-05-26 DIAGNOSIS — H53143 Visual discomfort, bilateral: Secondary | ICD-10-CM

## 2018-05-26 DIAGNOSIS — G43109 Migraine with aura, not intractable, without status migrainosus: Secondary | ICD-10-CM

## 2018-05-26 DIAGNOSIS — H8113 Benign paroxysmal vertigo, bilateral: Secondary | ICD-10-CM

## 2018-05-26 DIAGNOSIS — G4701 Insomnia due to medical condition: Secondary | ICD-10-CM

## 2018-05-26 MED ORDER — DONEPEZIL HCL 10 MG PO TABS: 10.0000 mg | ORAL_TABLET | Freq: Every morning | ORAL | 1 refills | Status: DC

## 2018-05-26 MED ORDER — ZOLPIDEM TARTRATE 5 MG PO TABS: 5.0000 mg | ORAL_TABLET | Freq: Every evening | ORAL | 3 refills | Status: DC | PRN

## 2018-05-26 NOTE — Progress Notes (Signed)
PATIENT: [name] DOB: 1961/04/01  REASON FOR VISIT: follow up- neuropathy, cognitive changes, insomnia HISTORY FROM: patient  HISTORY OF PRESENT ILLNESS: [name] is a 58 year old female with a history of neuropathy,cognitive changes and insomnia. She returns today for follow-up. The patient continues to take Ambien for insomnia. The patient states that if she does not take this medication she will not be able to go to sleep. The patient currently takes Lyrica 150 mg twice a day for neuropathy. Patient states that the burning and tingling is located in the toes extending to the mid foot bilaterally. She also has burning and tingling in the fingers that extends to the mid hand. Denies any changes with her gait or balance. That she had a fall while standing and watching fireworks. She states that the right leg went numb and cause her to fall. She states that this is the first time this has happened. She states that the numbness resolved. He states that she is not had any additional episodes of numbness. Patient feels that her memory has remained same. She states that she is able to do simple tasks but things that require more complex thinking she has difficulty with. For example she has trouble following plots in a TV show. She states that she has directions for how to make coffee posted. She is able to complete all ADLs independently. She operates a Teacher, music without difficulty. She states that occasionally she'll take a wrong turn to a familiar place. She continues to take Aricept 10 mg daily. Patient states that she did have a sleep study area and she is unsure if she should be wearing oxygen at bedtime. She states that she had a dental procedure and the technician noted that her oxygen saturation dropped while she was sleeping. She denies any new neurological issues. She returns today for an evaluation.  HISTORY 05/18/14 (CD): [name] is here today for her yearly revisit  she is doing remarkably well knowing about her history of metastatic cancer with unknown primary to more she seems to have battled her disease very well.  She has lost some weight since her last visit, her blood pressure is well controlled. The HST study performed on 10-23-13 showed obstructive sleep apnea not to be present, but some hypoxemia at night there were 109 minutes of oxygenation at 88% or less. Since this test was a home sleep test there is a higher likelihood of artifact. The patient does 1-2 times a night feel air hungry. She does wake occasionally up with a feeling that she doesn't breath enough. And her heart will be racing- palpitations have also not been seen in her home sleep test. She described sudden falls still being a concern, neuropathy contributing to this.  her cognition is improved, by her own subjective assessment as well as MOCA test today : scored 26 out of 30 points. This is a normal range for her cognitive function. Ambien dependent , unable to go to sleep without sleep aid for several years now.  Her list of medication was reviewed and there have been no recent changes. Her dog is her main social contact , besides her church family.   Interval history from 11/28/2015. [name] is here today and did excellent on today's memory testing. There is a variability between 26 and 22 points over the last 18 months. She mastered the trail making test, she recalled 3 out of 5 recall words. The patient had been advised in my last  visit with her to avoid driving at night, she lives alone and she relies on her own transportation. She has not gotten lost. She has  an exercise regimen and she has implemented dietary changes. She is an excellent mood today and would like to reduce her Ambien from 12 point 5 at night to 10 mg which makes me very happy. I would like for her to continue with the Aricept I will follow her every 6 months.  Interval history from 06/05/2016,  [name] at the meanwhile 58 year old woman with a long-standing history for ill-defined consult. She can continue to drive without difficulties and is independent in activities of daily living she reports rhinitis but I attributed to Aricept, falls that seem to be neither syncopal, nor seizure, no mechanical in origin. Her falls are always propulsive, she falls forward she has very little warning or numb and she finds herself dominant how she got to the floor. Her medical test score 25 out of 30 points today which is stable and actually improved to a year ago. She is taking Lyrica for neuropathy and Lyrica is also a seizure medicine, I have wondered if Aricept may give her bradycardia but her heart rate today was 92.  Interval history from the tenth of January 2019, I have the pleasure of meeting today with [name], I follow her yearly and we usually look at her degree of fatigue, gait stability and cognitive status.  She scored today on a [place] cognitive assessment 23 out of 30 points and was last year 25 out of 30 points.  She missed 2 of the 5 recall words.  And the day of the week. She had another fall in October 2018 which she considered bad.  She describes that her legs entangled for some reason she fell onto her right hip she did not suffer fractures just a hematoma.  This happened on a non-slip even floor and she is still not sure what happened. No loss of conscience. Another fall at her sons house on the day before New years. "I sometimes cannot feel my feet and they end up entangled or in the wrong place at the wrong time.  This time I fell and slipped over a coffee table.  05-26-2018.RV , yearly for disability forms.   [name] has become a grandmother 50 month ago and is very happy, son and his family live in [place]. She has survived an unknown primary cancer by now for many years. Still having neuropathy - as expected, and memory problems.     REVIEW OF  SYSTEMS: Out of a complete 14 system review of symptoms, the patient complains only of the following symptoms, and all other reviewed systems are negative.  Memory is variable, easily fatigued,   Foot numbness  and pain, progressive - On 200 Lyrica - but still high fall risk. Chemotherapy induced  Heat intolerance, excessive sweating, insomnia, daytime sleepiness, joint pain, memory loss, numbness due to neuropathy= Chemotherapy induced, para-neoplastic induced ? Marland Kitchen   ALLERGIES: Allergies  Allergen Reactions  . Codeine Nausea Only  . Orange Itching and Rash    HOME MEDICATIONS: Outpatient Medications Prior to Visit  Medication Sig Dispense Refill  . aspirin 81 MG tablet Take 81 mg by mouth daily. 2 TABS IN AM    . butalbital-acetaminophen-caffeine (FIORICET, ESGIC) 50-325-40 MG tablet TAKE 1 TABLET BY MOUTH EVERY 6 HOURS AS NEEDED FOR HEADACHE 10 tablet 3  . diclofenac (VOLTAREN) 75 MG EC tablet TK 1 T  PO BID AFTER MEALS FOR INFLAMMATION / PAIN / SWELLING PRN ONLY  1  . donepezil (ARICEPT) 10 MG tablet TAKE 1 TABLET BY MOUTH EVERY MORNING 90 tablet 1  . DULoxetine (CYMBALTA) 60 MG capsule TAKE ONE CAPSULE BY MOUTH DAILY 90 capsule 0  . Multiple Vitamin (MULTIVITAMIN WITH MINERALS) TABS tablet Take 1 tablet by mouth daily.    . orphenadrine (NORFLEX) 100 MG tablet TAKE 1 TABLET BY MOUTH TWICE DAILY AS NEEDED FOR MUSCLE SPASMS 180 tablet 0  . potassium chloride (K-DUR,KLOR-CON) 10 MEQ tablet TAKE 2 TABLETS BY MOUTH DAILY 60 tablet 0  . pregabalin (LYRICA) 200 MG capsule Take 1 capsule (200 mg total) by mouth 2 (two) times daily. 60 capsule 3  . simvastatin (ZOCOR) 40 MG tablet TAKE 1 TABLET BY MOUTH EVERY DAY 90 tablet 0  . triamterene-hydrochlorothiazide (MAXZIDE-25) 37.5-25 MG tablet TAKE 2 TABLETS BY MOUTH DAILY 180 tablet 0  . varenicline (CHANTIX STARTING MONTH PAK) 0.5 MG X 11 & 1 MG X 42 tablet Take 0.5 mg tablet once daily for 3 days, then increase to 0.5 mg tablet twice daily for 4  days, then increase to 1 mg tablet twice daily. 53 tablet 0  . varenicline (CHANTIX) 1 MG tablet Take 1 tablet (1 mg total) by mouth 2 (two) times daily. 60 tablet 3  . Vitamin D, Ergocalciferol, (DRISDOL) 50000 units CAPS capsule TAKE 1 CAPSULE BY MOUTH ONCE WEEKLY 12 capsule 0  . zolpidem (AMBIEN) 5 MG tablet TAKE 1 TO 2 TABLETS BY MOUTH AT BEDTIME AS NEEDED 60 tablet 3   No facility-administered medications prior to visit.     PAST MEDICAL HISTORY: Past Medical History:  Diagnosis Date  . Adenocarcinoma [facility])    unknown primary probaby ovarian 2009 chemo  . Adjustment disorder   . Allergic rhinitis   . Alopecia   . Anxiety and depression   . 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 HLD (hyperlipidemia)   . Hx pulmonary embolism   . Hypertension   . Iron deficiency anemia   . Iron deficiency anemia due to chronic blood loss 11/04/2016  . Metrorrhagia   . Neuropathy   . Obesity   . Ovarian cancer ([place]) 48  . 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 Raynaud's syndrome   . Right groin mass   . Squamous cell carcinoma of skin   . Vision abnormalities   . Vitamin D deficiency     PAST SURGICAL HISTORY: Past Surgical History:  Procedure Laterality Date  . ABDOMINAL HYSTERECTOMY  05/2008   TAH/BSO  . BREAST REDUCTION SURGERY  2000  . CESAREAN SECTION     1991  . COLONOSCOPY  2004; 12/07/10   hemorrhoids  . GASTRIC BYPASS  1979  . TONSILLECTOMY      FAMILY HISTORY: Family History  Problem Relation Age of Onset  . Pulmonary embolism Mother   . Hypertension Father   . Breast cancer Maternal Grandmother        diagnosed in early 63s  . Hyperlipidemia Unknown     SOCIAL HISTORY: Social History   Socioeconomic History  . Marital status: Divorced    Spouse name: Not on file  . Number of children: 1  . Years of education: Master's  . Highest education level: Not on file  Occupational History    Employer: UNEMPLOYED    Comment: disabled  Social Needs  . Financial  resource strain: Not on file  . Food insecurity:    Worry: Not on file  Inability: Not on file  . Transportation needs:    Medical: Not on file    Non-medical: Not on file  Tobacco Use  . Smoking status: Current Every Day Smoker    Packs/day: 1.00    Years: 37.00    Pack years: 37.00    Types: Cigarettes    Start date: 06/16/1978  . Smokeless tobacco: Never Used  . Tobacco comment: 2- 7-16  STILL SMOKING  Substance and Sexual Activity  . Alcohol use: Yes    Alcohol/week: 0.0 standard drinks    Comment: rare, 3 weekly  . Drug use: No  . Sexual activity: Not on file  Lifestyle  . Physical activity:    Days per week: Not on file    Minutes per session: Not on file  . Stress: Not on file  Relationships  . Social connections:    Talks on phone: Not on file    Gets together: Not on file    Attends religious service: Not on file    Active member of club or organization: Not on file    Attends meetings of clubs or organizations: Not on file    Relationship status: Not on file  . Intimate partner violence:    Fear of current or ex partner: Not on file    Emotionally abused: Not on file    Physically abused: Not on file    Forced sexual activity: Not on file  Other Topics Concern  . Not on file  Social History Narrative   Divorced   1 son /1 adopted adult son  2 dogs    Sales occupation on disability for now   Regular exercise-yes money stress   Daily caffeine use 2-3 and 2 cups of coffee   On since a security disability since 2012   Patient is right-handed.   Patient has a Master's degree.      PHYSICAL EXAM  Vitals:   05/26/18 1540  BP: 122/88  Pulse: 77  Weight: 220 lb (99.8 kg)  Height: _0  (1.727 m)   Body mass index is 33.45 kg/m.  Generalized: Well developed, in no acute distress, well groomed and happy!    Neurological examination  Mentation: Alert oriented to time, place, history taking. Follows all commands speech and language fluent.  [place]  Cognitive Assessment  05/26/2018 05/22/2017 06/05/2016 11/28/2015 05/31/2015  Visuospatial/ Executive (0/5) _1 Naming (0/3) _2 Attention: Read list of digits (0/2) 1 0 1 1 0  Attention: Read list of letters (0/1) _3 Attention: Serial 7 subtraction starting at 100 (0/3) _4 Language: Repeat phrase (0/2) _5 Language : Fluency (0/1) _6 Abstraction (0/2) _7 Delayed Recall (0/5) _8 Orientation (0/6) _9 Total _10 Adjusted Score (based on education) - - _11 Cranial nerve : No change in taste ,but complete loss of smell .ANOSMIA. for 5 years now.  Pupils were equal round reactive to light. Extraocular movements were full, visual field were full on confrontational test . Facial sensation and strength were normal. Uvula tongue midline.  Bilateral tongue protrusion is strong.   Motor: normal strength in 4 extremities, except foot dorsiflexion.  symmetric motor tone is noted throughout.  Sensory:  Progressed, profound numbness  in feet and calf. Arising from the toes through the forefoot to the heel. Pain arising from the Forefoot.  Hand pain and Raynauds to the wrist,  Coordination:  Gait and station: Gait is normal. Tandem gait is abnormal. She needs to control her steps  Optically, needs to see where she steps due to lack of sensation.   Romberg is negative !  Reflexes: Deep tendon reflexes are symmetrically attanuated  bilaterally.    DIAGNOSTIC DATA (LABS, IMAGING, TESTING) - I reviewed patient records, labs, notes, testing and imaging myself where available.  NCV and EMG see chart.   MRI back see [facility]   Lab Results  Component Value Date   WBC 5.7 04/15/2018   HGB 13.7 04/15/2018   HCT 43.5 04/15/2018   MCV 90.4 04/15/2018   PLT 232 04/15/2018    Lab Results  Component Value Date   CHOL 160 05/30/2017   HDL 49.00 05/30/2017   LDLCALC 79 05/30/2017   LDLDIRECT 98.7 09/04/2009   TRIG  159.0 (H) 05/30/2017   CHOLHDL 3 05/30/2017    ASSESSMENT AND PLAN 58 y.o. year old afro american divorced and later widowed right handed female patient , here for q 6 month memory testing and refills.   0)  Ovarian Cancer diagnosed in 2009 ! She is a survivor- remarkable recovery.    1. 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  I have refilled ambien- I do not think it's worth weaning off, given her longstanding use of sleep aids.   2. Cognitive changes in patient with ovarian cancer and long term chemotherapy - MOCA now up  23- 25 / 25-30 from 22-30 in January 2017.  Neuropathy, thought to be chemotherapy induced. . Dysautonomia, syncopal events, Has fallen in her house. She is aware of her surroundings, stunned but not confused, not postictal. Had a fall in 2016, in 2017, and 3 in 2018 - I am especially worried about these falls that seem to be related to her inability to feel anything with her feet the surface she stands on, the position of the foot this is a profound and progressed numbness now associated with pain and Lyrica has improved the pain but cannot give her normal sensation back.  NCV did not show anything 4 years ago.     5. Due to her ongoing cognitive problems I would not recommend the use of Topiramate-  and with her history of ovarian cancer I am afraid of burdening her liver metabolism. MOCA 25/ 30.  Comprehensive metabolic panel allows  migraine prevention with a beta blocker and continue on Lyrica. Her heart rate is high, not slow.  She will continue taking Ambien for insomnia.I will refill this today for 90 days and 1 refill. She is using 10 mg nightly . Patient's neuropathy is progressive and dysautonomia is part of this PNP.   She continues on Lyrica, now increased to 200 mg bid from 150 mg twice a day. She already had accu-puncture and cupping and dry needling.   Rv in 12 month for disability papers.    Larey Seat, MD   05/26/2018, 4:05 PM [facility] [address] [place], [place] [address] [phone]

## 2018-06-03 ENCOUNTER — Other Ambulatory Visit: Payer: Self-pay | Admitting: Hematology & Oncology

## 2018-06-03 ENCOUNTER — Other Ambulatory Visit: Payer: Self-pay | Admitting: Family

## 2018-06-03 ENCOUNTER — Other Ambulatory Visit: Payer: Self-pay | Admitting: Internal Medicine

## 2018-06-03 DIAGNOSIS — Z1231 Encounter for screening mammogram for malignant neoplasm of breast: Secondary | ICD-10-CM

## 2018-06-04 ENCOUNTER — Other Ambulatory Visit: Payer: Self-pay | Admitting: Hematology & Oncology

## 2018-06-15 NOTE — Progress Notes (Signed)
Chief Complaint  Patient presents with  . Annual Exam    Pt is concern about back pain and hip pain injections are not helping any more. Pt is also concern about pain in both big toes    HPI: [name] 58 y.o. comes in today for Preventive Medicare exam/ and Chronic disease management  .Since last visit. She is undergone multiple back injections which has helped initially but not as much anymore. She has neuropathy in her feet but is been getting pain around the toenails great toenails right more than left and some thickening wonders if it is ingrowing and other options for help.  No falling. Memory is about the same although she feels it is getting worse Sleep using less Ambien Insurance was not planning for Norflex twice daily muscle relaxant which seem to have helped her back wonders about other options.  The cost out-of-pocket which is quite high $70 plus.  She has been travel volunteering no steady job goes to disaster areas different areas.  Asked about immunizations. She is to get her mammogram soon Sees Dr. Brett Fairy Dr. Marin Olp. She had stopped tobacco and had relapsed slightly back on Chantix stopping tobacco.   Health Maintenance  Topic Date Due  . MAMMOGRAM  02/12/2018  . COLONOSCOPY  12/05/2020  . TETANUS/TDAP  11/03/2023  . INFLUENZA VACCINE  Completed  . Hepatitis C Screening  Completed  . HIV Screening  Completed   Health Maintenance Review LIFESTYLE:  Exercise:  Walks  Tobacco/ETS:y Alcohol: ocass Sugar beverages: Sleep:off schedule 10hours sometimes  Drug use: no HH: 1       Hearing: ok  Vision:  No limitations at present . Last eye check UTD  Safety:  Has smoke detector and wears seat belts.  . No excess sun exposure. Sees dentist regularly.  Falls: ne.  Memory: Felt to be good  , no concern from her or her family.  Depression: No anhedonia unusual crying or depressive symptoms  Nutrition: Eats well balanced diet; adequate calcium  and vitamin D. No swallowing chewing problems.  Injury: no major injuries in the last six months.  Other healthcare providers:  Reviewed today .  Preventive parameters:   Reviewed   ADLS:   There are no problems or need for assistance  driving, feeding, obtaining food, dressing, toileting and bathing, managing money using phone. She is independent.  She is  limited but neuropathy and memory at some task      ROS:  See hpi  GEN/ HEENT: No fever, significant weight changes sweats headaches vision problems hearing changes, CV/ PULM; No chest pain shortness of breath cough, syncope,edema  change in exercise tolerance. GI /GU: No adominal pain, vomiting, change in bowel habits. No blood in the stool. No significant GU symptoms. SKIN/HEME: ,no acute skin rashes suspicious lesions or bleeding. No lymphadenopathy, nodules, masses.  NEURO/ PSYCH:  No new neurologic signs. No depression anxiety. IMM/ Allergy: No unusual infections.  Allergy .   REST of 12 system review negative except as per HPI   Past Medical History:  Diagnosis Date  . 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 Adjustment disorder   . Allergic rhinitis   . Alopecia   . Anxiety and depression   . 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 HLD (hyperlipidemia)   . Hx pulmonary embolism   . Hypertension   . Iron deficiency anemia   . Iron deficiency anemia due to chronic blood loss 11/04/2016  .  Metrorrhagia   . Neuropathy   . Obesity   . Ovarian cancer ([place]) 48  . 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 Raynaud's syndrome   . Right groin mass   . Squamous cell carcinoma of skin   . Vision abnormalities   . Vitamin D deficiency     Family History  Problem Relation Age of Onset  . Pulmonary embolism Mother   . Hypertension Father   . Breast cancer Maternal Grandmother        diagnosed in early 61s  . Hyperlipidemia Unknown     Social History   Socioeconomic History  . Marital  status: Divorced    Spouse name: Not on file  . Number of children: 1  . Years of education: Master's  . Highest education level: Not on file  Occupational History    Employer: UNEMPLOYED    Comment: disabled  Social Needs  . Financial resource strain: Not on file  . Food insecurity:    Worry: Not on file    Inability: Not on file  . Transportation needs:    Medical: Not on file    Non-medical: Not on file  Tobacco Use  . Smoking status: Current Every Day Smoker    Packs/day: 1.00    Years: 37.00    Pack years: 37.00    Types: Cigarettes    Start date: 06/16/1978  . Smokeless tobacco: Never Used  . Tobacco comment: 2- 7-16  STILL SMOKING  Substance and Sexual Activity  . Alcohol use: Yes    Alcohol/week: 0.0 standard drinks    Comment: rare, 3 weekly  . Drug use: No  . Sexual activity: Not on file  Lifestyle  . Physical activity:    Days per week: Not on file    Minutes per session: Not on file  . Stress: Not on file  Relationships  . Social connections:    Talks on phone: Not on file    Gets together: Not on file    Attends religious service: Not on file    Active member of club or organization: Not on file    Attends meetings of clubs or organizations: Not on file    Relationship status: Not on file  Other Topics Concern  . Not on file  Social History Narrative   Divorced   1 son /1 adopted adult son  2 dogs    Sales occupation on disability for now   Regular exercise-yes money stress   Daily caffeine use 2-3 and 2 cups of coffee   On since a security disability since 2012   Patient is right-handed.   Patient has a Master's degree.    Outpatient Encounter Medications as of 06/16/2018  Medication Sig  . aspirin 81 MG tablet Take 81 mg by mouth daily. 2 TABS IN AM  . butalbital-acetaminophen-caffeine (FIORICET, ESGIC) 50-325-40 MG tablet TAKE 1 TABLET BY MOUTH EVERY 6 HOURS AS NEEDED FOR HEADACHE  . diclofenac (VOLTAREN) 75 MG EC tablet TK 1 T PO BID AFTER  MEALS FOR INFLAMMATION / PAIN / SWELLING PRN ONLY  . donepezil (ARICEPT) 10 MG tablet Take 1 tablet (10 mg total) by mouth every morning.  . DULoxetine (CYMBALTA) 60 MG capsule TAKE ONE CAPSULE BY MOUTH DAILY  . Multiple Vitamin (MULTIVITAMIN WITH MINERALS) TABS tablet Take 1 tablet by mouth daily.  . orphenadrine (NORFLEX) 100 MG tablet TAKE 1 TABLET BY MOUTH TWICE DAILY AS NEEDED FOR MUSCLE SPASMS  . potassium chloride (K-DUR,KLOR-CON) 10 MEQ  tablet TAKE 2 TABLETS BY MOUTH DAILY  . pregabalin (LYRICA) 200 MG capsule TAKE ONE CAPSULE BY MOUTH TWICE DAILY  . simvastatin (ZOCOR) 40 MG tablet TAKE 1 TABLET BY MOUTH EVERY DAY  . triamterene-hydrochlorothiazide (MAXZIDE-25) 37.5-25 MG tablet TAKE 2 TABLETS BY MOUTH DAILY  . varenicline (CHANTIX STARTING MONTH PAK) 0.5 MG X 11 & 1 MG X 42 tablet Take 0.5 mg tablet once daily for 3 days, then increase to 0.5 mg tablet twice daily for 4 days, then increase to 1 mg tablet twice daily.  . varenicline (CHANTIX) 1 MG tablet Take 1 tablet (1 mg total) by mouth 2 (two) times daily.  . Vitamin D, Ergocalciferol, (DRISDOL) 50000 units CAPS capsule TAKE 1 CAPSULE BY MOUTH ONCE WEEKLY  . zolpidem (AMBIEN) 5 MG tablet Take 1-2 tablets (5-10 mg total) by mouth at bedtime as needed.   No facility-administered encounter medications on file as of 06/16/2018.     EXAM:  BP 118/66 (BP Location: Right Arm, Patient Position: Sitting, Cuff Size: Large)   Pulse 84   Temp 97.7 F (36.5 C) (Oral)   Ht 5' 7"  (1.702 m)   Wt 218 lb 14.4 oz (99.3 kg)   BMI 34.28 kg/m   Body mass index is 34.28 kg/m.  Physical Exam: Vital signs reviewed AQT:MAUQ is a well-developed well-nourished alert cooperative   who appears stated age in no acute distress.  HEENT: normocephalic atraumatic , Eyes: PERRL EOM's full, conjunctiva clear, Nares: paten,t no deformity discharge or tenderness., Ears: no deformity EAC's clear TMs with normal landmarks. Mouth: clear OP, no lesions, edema.   Moist mucous membranes. Dentition in adequate repair. NECK: supple without masses, thyromegaly or bruits. CHEST/PULM:  Clear to auscultation and percussion breath sounds equal no wheeze , rales or rhonchi. No chest wall deformities or tenderness. CV: PMI is nondisplaced, S1 S2 no gallops, murmurs, rubs. Peripheral pulses are full without delay.No JVD . Breast: normal by inspection . No dimpling, discharge, masses, tenderness or discharge . Well healed  Breast scars  ABDOMEN: Bowel sounds normal nontender  No guard or rebound, no hepato splenomegal no CVA tenderness.   Extremtities:  No clubbing cyanosis or edema, no acute joint swelling or redness no focal atrophy NEURO:  Oriented x3, cranial nerves 3-12 appear to be intact, no obvious focal weakness,gait within normal limits no abnormal reflexes or asymmetrical SKIN: No acute rashes normal turgor, color, no bruising or petechiae.  Grate toe nialspainted thickened and some discolor  No redness some ingrowing. PSYCH: Oriented, good eye contact, no obvious depression anxiety, LN: no cervical axillary inguinal adenopath  Lab Results  Component Value Date   WBC 5.7 04/15/2018   HGB 13.7 04/15/2018   HCT 43.5 04/15/2018   PLT 232 04/15/2018   GLUCOSE 80 04/15/2018   CHOL 169 06/16/2018   TRIG 153.0 (H) 06/16/2018   HDL 43.90 06/16/2018   LDLDIRECT 98.7 09/04/2009   LDLCALC 94 06/16/2018   ALT 29 04/15/2018   AST 26 04/15/2018   NA 138 04/15/2018   K 3.7 04/15/2018   CL 101 04/15/2018   CREATININE 0.95 04/15/2018   BUN 20 04/15/2018   CO2 29 04/15/2018   TSH 3.99 06/16/2018   INR 0.86 06/02/2013   reveiwed labs  Thyroid and lipids   Fasting today>>> ASSESSMENT AND PLAN:  Discussed the following assessment and plan:  Visit for preventive health examination  Medication management - Plan: Lipid panel, TSH, T4, free  Elevated TSH - Plan: Lipid panel, TSH, T4,  free  Hyperlipidemia, unspecified hyperlipidemia type - Plan: Lipid  panel, TSH, T4, free  Need for hepatitis A and B vaccination - Plan: Hepatitis A hepatitis B combined vaccine IM  Neuropathy due to chemotherapeutic drug ([place]) - Plan: Ambulatory referral to Podiatry  Pain around toenail - Plan: Ambulatory referral to Podiatry  BRCA gene positive Discussed immunizations appears to be up-to-date but may have not had hep A or B and she does travel.  Discussed Twinrix vaccine to begin today. She can get Shingrix vaccine at pharmacy to have it covered by her insurance. We will continue efforts at smoking cessation again as she has been tobacco free in the past. If all is well I can see her in a year CPX she has other specialist.  She will look into formulary good Rx as far as cost and medications for her back.  She can also asked Dr. Leola Brazil the specialist what he thinks would do best.  Knowing risk benefit.  Patient Care Team: Lexia Vandevender, Standley Brooking, MD as PCP - General (Internal Medicine) Gatha Mayer, MD (Gastroenterology) Yaakov Guthrie, PsyD (Psychiatry) [name], MD as Attending Physician (Internal Medicine) Dohmeier, Asencion Partridge, MD (Neurology) Normajean Glasgow, MD as Attending Physician (Physical Medicine and Rehabilitation)  Patient Instructions  Beginning  Twin rix vaccine ( hep a and b) Look at  Good rx   And talk with dr Mina Marble about  Best muscle relaxant etc  For the norflex.  Will notify you  of labs when available.   Will do a referral to podiatry.   Keep working on tobacco free.   Preventive Care 40-64 Years, Female Preventive care refers to lifestyle choices and visits with your health care provider that can promote health and wellness. What does preventive care include?   A yearly physical exam. This is also called an annual well check.  Dental exams once or twice a year.  Routine eye exams. Ask your health care provider how often you should have your eyes checked.  Personal lifestyle choices, including: ? Daily care of your teeth  and gums. ? Regular physical activity. ? Eating a healthy diet. ? Avoiding tobacco and drug use. ? Limiting alcohol use. ? Practicing safe sex. ? Taking low-dose aspirin daily starting at age 64. ? Taking vitamin and mineral supplements as recommended by your health care provider. What happens during an annual well check? The services and screenings done by your health care provider during your annual well check will depend on your age, overall health, lifestyle risk factors, and family history of disease. Counseling Your health care provider may ask you questions about your:  Alcohol use.  Tobacco use.  Drug use.  Emotional well-being.  Home and relationship well-being.  Sexual activity.  Eating habits.  Work and work Statistician.  Method of birth control.  Menstrual cycle.  Pregnancy history. Screening You may have the following tests or measurements:  Height, weight, and BMI.  Blood pressure.  Lipid and cholesterol levels. These may be checked every 5 years, or more frequently if you are over 19 years old.  Skin check.  Lung cancer screening. You may have this screening every year starting at age 27 if you have a 30-pack-year history of smoking and currently smoke or have quit within the past 15 years.  Colorectal cancer screening. All adults should have this screening starting at age 60 and continuing until age 42. Your health care provider may recommend screening at age 57. You will have tests every  1-10 years, depending on your results and the type of screening test. People at increased risk should start screening at an earlier age. Screening tests may include: ? Guaiac-based fecal occult blood testing. ? Fecal immunochemical test (FIT). ? Stool DNA test. ? Virtual colonoscopy. ? Sigmoidoscopy. During this test, a flexible tube with a tiny camera (sigmoidoscope) is used to examine your rectum and lower colon. The sigmoidoscope is inserted through your anus  into your rectum and lower colon. ? Colonoscopy. During this test, a long, thin, flexible tube with a tiny camera (colonoscope) is used to examine your entire colon and rectum.  Hepatitis C blood test.  Hepatitis B blood test.  Sexually transmitted disease (STD) testing.  Diabetes screening. This is done by checking your blood sugar (glucose) after you have not eaten for a while (fasting). You may have this done every 1-3 years.  Mammogram. This may be done every 1-2 years. Talk to your health care provider about when you should start having regular mammograms. This may depend on whether you have a family history of breast cancer.  BRCA-related cancer screening. This may be done if you have a family history of breast, ovarian, tubal, or peritoneal cancers.  Pelvic exam and Pap test. This may be done every 3 years starting at age 37. Starting at age 65, this may be done every 5 years if you have a Pap test in combination with an HPV test.  Bone density scan. This is done to screen for osteoporosis. You may have this scan if you are at high risk for osteoporosis. Discuss your test results, treatment options, and if necessary, the need for more tests with your health care provider. Vaccines Your health care provider may recommend certain vaccines, such as:  Influenza vaccine. This is recommended every year.  Tetanus, diphtheria, and acellular pertussis (Tdap, Td) vaccine. You may need a Td booster every 10 years.  Varicella vaccine. You may need this if you have not been vaccinated.  Zoster vaccine. You may need this after age 66.  Measles, mumps, and rubella (MMR) vaccine. You may need at least one dose of MMR if you were born in 1957 or later. You may also need a second dose.  Pneumococcal 13-valent conjugate (PCV13) vaccine. You may need this if you have certain conditions and were not previously vaccinated.  Pneumococcal polysaccharide (PPSV23) vaccine. You may need one or two  doses if you smoke cigarettes or if you have certain conditions.  Meningococcal vaccine. You may need this if you have certain conditions.  Hepatitis A vaccine. You may need this if you have certain conditions or if you travel or work in places where you may be exposed to hepatitis A.  Hepatitis B vaccine. You may need this if you have certain conditions or if you travel or work in places where you may be exposed to hepatitis B.  Haemophilus influenzae type b (Hib) vaccine. You may need this if you have certain conditions. Talk to your health care provider about which screenings and vaccines you need and how often you need them. This information is not intended to replace advice given to you by your health care provider. Make sure you discuss any questions you have with your health care provider. Document Released: 05/26/2015 Document Revised: 06/19/2017 Document Reviewed: 02/28/2015 Elsevier Interactive Patient Education  2019 [place] K. Primus Gritton M.D.

## 2018-06-16 ENCOUNTER — Encounter: Payer: Self-pay | Admitting: Internal Medicine

## 2018-06-16 ENCOUNTER — Ambulatory Visit (INDEPENDENT_AMBULATORY_CARE_PROVIDER_SITE_OTHER): Payer: Medicare Other | Admitting: Internal Medicine

## 2018-06-16 VITALS — BP 118/66 | HR 84 | Temp 97.7°F | Ht 67.0 in | Wt 218.9 lb

## 2018-06-16 DIAGNOSIS — G620 Drug-induced polyneuropathy: Secondary | ICD-10-CM | POA: Diagnosis not present

## 2018-06-16 DIAGNOSIS — R7989 Other specified abnormal findings of blood chemistry: Secondary | ICD-10-CM

## 2018-06-16 DIAGNOSIS — Z0000 Encounter for general adult medical examination without abnormal findings: Secondary | ICD-10-CM

## 2018-06-16 DIAGNOSIS — Z1501 Genetic susceptibility to malignant neoplasm of breast: Secondary | ICD-10-CM

## 2018-06-16 DIAGNOSIS — E785 Hyperlipidemia, unspecified: Secondary | ICD-10-CM | POA: Diagnosis not present

## 2018-06-16 DIAGNOSIS — Z23 Encounter for immunization: Secondary | ICD-10-CM

## 2018-06-16 DIAGNOSIS — Z79899 Other long term (current) drug therapy: Secondary | ICD-10-CM | POA: Diagnosis not present

## 2018-06-16 DIAGNOSIS — M79676 Pain in unspecified toe(s): Secondary | ICD-10-CM

## 2018-06-16 DIAGNOSIS — Z1509 Genetic susceptibility to other malignant neoplasm: Secondary | ICD-10-CM

## 2018-06-16 DIAGNOSIS — T451X5A Adverse effect of antineoplastic and immunosuppressive drugs, initial encounter: Secondary | ICD-10-CM

## 2018-06-16 LAB — LIPID PANEL
Cholesterol: 169 mg/dL (ref 0–200)
HDL: 43.9 mg/dL (ref >39.00)
LDL CALC: 94 mg/dL (ref 0–99)
NonHDL: 124.73
Total CHOL/HDL Ratio: 4
Triglycerides: 153 mg/dL — ABNORMAL HIGH (ref 0.0–149.0)
VLDL: 30.6 mg/dL (ref 0.0–40.0)

## 2018-06-16 LAB — T4, FREE: Free T4: 1 ng/dL (ref 0.60–1.60)

## 2018-06-16 LAB — TSH: TSH: 3.99 u[IU]/mL (ref 0.35–4.50)

## 2018-06-16 NOTE — Patient Instructions (Addendum)
Beginning  Twin rix vaccine ( hep a and b) Look at  Good rx   And talk with dr Mina Marble about  Best muscle relaxant etc  For the norflex.  Will notify you  of labs when available.   Will do a referral to podiatry.   Keep working on tobacco free.   Preventive Care 40-64 Years, Female Preventive care refers to lifestyle choices and visits with your health care provider that can promote health and wellness. What does preventive care include?   A yearly physical exam. This is also called an annual well check.  Dental exams once or twice a year.  Routine eye exams. Ask your health care provider how often you should have your eyes checked.  Personal lifestyle choices, including: ? Daily care of your teeth and gums. ? Regular physical activity. ? Eating a healthy diet. ? Avoiding tobacco and drug use. ? Limiting alcohol use. ? Practicing safe sex. ? Taking low-dose aspirin daily starting at age 22. ? Taking vitamin and mineral supplements as recommended by your health care provider. What happens during an annual well check? The services and screenings done by your health care provider during your annual well check will depend on your age, overall health, lifestyle risk factors, and family history of disease. Counseling Your health care provider may ask you questions about your:  Alcohol use.  Tobacco use.  Drug use.  Emotional well-being.  Home and relationship well-being.  Sexual activity.  Eating habits.  Work and work Statistician.  Method of birth control.  Menstrual cycle.  Pregnancy history. Screening You may have the following tests or measurements:  Height, weight, and BMI.  Blood pressure.  Lipid and cholesterol levels. These may be checked every 5 years, or more frequently if you are over 45 years old.  Skin check.  Lung cancer screening. You may have this screening every year starting at age 73 if you have a 30-pack-year history of smoking and  currently smoke or have quit within the past 15 years.  Colorectal cancer screening. All adults should have this screening starting at age 44 and continuing until age 70. Your health care provider may recommend screening at age 40. You will have tests every 1-10 years, depending on your results and the type of screening test. People at increased risk should start screening at an earlier age. Screening tests may include: ? Guaiac-based fecal occult blood testing. ? Fecal immunochemical test (FIT). ? Stool DNA test. ? Virtual colonoscopy. ? Sigmoidoscopy. During this test, a flexible tube with a tiny camera (sigmoidoscope) is used to examine your rectum and lower colon. The sigmoidoscope is inserted through your anus into your rectum and lower colon. ? Colonoscopy. During this test, a long, thin, flexible tube with a tiny camera (colonoscope) is used to examine your entire colon and rectum.  Hepatitis C blood test.  Hepatitis B blood test.  Sexually transmitted disease (STD) testing.  Diabetes screening. This is done by checking your blood sugar (glucose) after you have not eaten for a while (fasting). You may have this done every 1-3 years.  Mammogram. This may be done every 1-2 years. Talk to your health care provider about when you should start having regular mammograms. This may depend on whether you have a family history of breast cancer.  BRCA-related cancer screening. This may be done if you have a family history of breast, ovarian, tubal, or peritoneal cancers.  Pelvic exam and Pap test. This may be done every 3  years starting at age 51. Starting at age 60, this may be done every 5 years if you have a Pap test in combination with an HPV test.  Bone density scan. This is done to screen for osteoporosis. You may have this scan if you are at high risk for osteoporosis. Discuss your test results, treatment options, and if necessary, the need for more tests with your health care  provider. Vaccines Your health care provider may recommend certain vaccines, such as:  Influenza vaccine. This is recommended every year.  Tetanus, diphtheria, and acellular pertussis (Tdap, Td) vaccine. You may need a Td booster every 10 years.  Varicella vaccine. You may need this if you have not been vaccinated.  Zoster vaccine. You may need this after age 14.  Measles, mumps, and rubella (MMR) vaccine. You may need at least one dose of MMR if you were born in 1957 or later. You may also need a second dose.  Pneumococcal 13-valent conjugate (PCV13) vaccine. You may need this if you have certain conditions and were not previously vaccinated.  Pneumococcal polysaccharide (PPSV23) vaccine. You may need one or two doses if you smoke cigarettes or if you have certain conditions.  Meningococcal vaccine. You may need this if you have certain conditions.  Hepatitis A vaccine. You may need this if you have certain conditions or if you travel or work in places where you may be exposed to hepatitis A.  Hepatitis B vaccine. You may need this if you have certain conditions or if you travel or work in places where you may be exposed to hepatitis B.  Haemophilus influenzae type b (Hib) vaccine. You may need this if you have certain conditions. Talk to your health care provider about which screenings and vaccines you need and how often you need them. This information is not intended to replace advice given to you by your health care provider. Make sure you discuss any questions you have with your health care provider. Document Released: 05/26/2015 Document Revised: 06/19/2017 Document Reviewed: 02/28/2015 Elsevier Interactive Patient Education  2019 [employer].

## 2018-06-17 ENCOUNTER — Ambulatory Visit: Payer: Medicare Other | Admitting: Internal Medicine

## 2018-06-17 ENCOUNTER — Encounter: Payer: Self-pay | Admitting: Internal Medicine

## 2018-06-17 VITALS — BP 122/80 | HR 74 | Ht 67.0 in | Wt 224.0 lb

## 2018-06-17 DIAGNOSIS — K219 Gastro-esophageal reflux disease without esophagitis: Secondary | ICD-10-CM | POA: Diagnosis not present

## 2018-06-17 DIAGNOSIS — R638 Other symptoms and signs concerning food and fluid intake: Secondary | ICD-10-CM | POA: Diagnosis not present

## 2018-06-17 DIAGNOSIS — Z9884 Bariatric surgery status: Secondary | ICD-10-CM | POA: Diagnosis not present

## 2018-06-17 DIAGNOSIS — K449 Diaphragmatic hernia without obstruction or gangrene: Secondary | ICD-10-CM | POA: Diagnosis not present

## 2018-06-17 NOTE — Progress Notes (Signed)
[name] 58 y.o. 1960/09/18 [id number]  Assessment & Plan:   Encounter Diagnoses  Name Primary?  . Gastroesophageal reflux disease, esophagitis presence not specified Yes  . Hiatal hernia   . H/O bariatric surgery   . Abnormal food appetite    Her gastric pouch is clearly intrathoracic now or at least partially.  Maybe this is related to her symptoms.  It is bothering her so we will investigate with EGD May need UGI Some of her symptoms, the sleepiness etc. sound like dumping syndrome almost.  She does not need a colonoscopy until 2022.  We have a recall in place.  Copy to Dr. Burney Gauze CC: Regis Bill, Standley Brooking, MD    Subjective:   Chief Complaint: Time for colonoscopy?,  Reflux  HPI Jerrye is here because Dr. Marin Olp thought she was due for a colonoscopy having thought that it was last done in 2004 but she had a negative colonoscopy in 2012 so she is not due.  However there are other issues with reflux that she wants to discuss.  She had chemotherapy for what they think was probably ovarian cancer though that was a carcinoma of unknown origin.  That was probably 9 years ago and since that time she developed more problems with heartburn, and her appetite schedule and sleep schedule is off as well.  Ambien is used for some more normal sleep schedule.  In the past few years she has been taking Tums at bedtime and she got a bed that adjusts and is raise the head of her bed and is able to do pretty well without symptoms.  However she feels like her appetite and her ability to eat has changed over time to where she can eat more.  She also notes that if she eats before 5 or 6 in the afternoon she just feels terrible.  Gets very very sleepy almost like she has narcolepsy.  About a year ago she was prescribed a PPI but there was some drug interaction so that is why she did lifestyle and change the head of her bed and she thinks that in the Tums does a good job at this point.  She had  bariatric type surgery many years ago and I have performed an upper endoscopy in 2004 that demonstrated postoperative changes.  Looking back at her imaging, CT scans dating back to 2010 describe a hiatal hernia, chest and abdominal pelvic scans.  However in 2009 in 2006, though I cannot see images, as in these others going back to 2010, it reports postoperative changes but does not describe a hiatal hernia.  I did not appreciate a hiatal hernia on 2004 EGD. She wonders if she needs her hiatal hernia fixed.  Allergies  Allergen Reactions  . Codeine Nausea Only  . Orange Itching and Rash   Current Meds  Medication Sig  . aspirin 81 MG tablet Take 81 mg by mouth daily. 2 TABS IN AM  . butalbital-acetaminophen-caffeine (FIORICET, ESGIC) 50-325-40 MG tablet TAKE 1 TABLET BY MOUTH EVERY 6 HOURS AS NEEDED FOR HEADACHE  . calcium carbonate (TUMS - DOSED IN MG ELEMENTAL CALCIUM) 500 MG chewable tablet Chew 1 tablet by mouth daily as needed for indigestion or heartburn.  . diclofenac (VOLTAREN) 75 MG EC tablet TK 1 T PO BID AFTER MEALS FOR INFLAMMATION / PAIN / SWELLING PRN ONLY  . donepezil (ARICEPT) 10 MG tablet Take 1 tablet (10 mg total) by mouth every morning.  . DULoxetine (CYMBALTA) 60 MG capsule  TAKE ONE CAPSULE BY MOUTH DAILY  . Multiple Vitamin (MULTIVITAMIN WITH MINERALS) TABS tablet Take 1 tablet by mouth daily.  . orphenadrine (NORFLEX) 100 MG tablet TAKE 1 TABLET BY MOUTH TWICE DAILY AS NEEDED FOR MUSCLE SPASMS  . potassium chloride (K-DUR,KLOR-CON) 10 MEQ tablet TAKE 2 TABLETS BY MOUTH DAILY  . pregabalin (LYRICA) 200 MG capsule TAKE ONE CAPSULE BY MOUTH TWICE DAILY  . simvastatin (ZOCOR) 40 MG tablet TAKE 1 TABLET BY MOUTH EVERY DAY  . triamterene-hydrochlorothiazide (MAXZIDE-25) 37.5-25 MG tablet TAKE 2 TABLETS BY MOUTH DAILY  . varenicline (CHANTIX STARTING MONTH PAK) 0.5 MG X 11 & 1 MG X 42 tablet Take 0.5 mg tablet once daily for 3 days, then increase to 0.5 mg tablet twice daily for  4 days, then increase to 1 mg tablet twice daily.  . varenicline (CHANTIX) 1 MG tablet Take 1 tablet (1 mg total) by mouth 2 (two) times daily.  . Vitamin D, Ergocalciferol, (DRISDOL) 50000 units CAPS capsule TAKE 1 CAPSULE BY MOUTH ONCE WEEKLY  . zolpidem (AMBIEN) 5 MG tablet Take 1-2 tablets (5-10 mg total) by mouth at bedtime as needed.   Past Medical History:  Diagnosis Date  . Adenocarcinoma [facility])    unknown primary probaby ovarian 2009 chemo  . Adjustment disorder   . Allergic rhinitis   . Alopecia   . Anxiety and depression   . 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 HLD (hyperlipidemia)   . Hx pulmonary embolism   . Hypertension   . Iron deficiency anemia   . Iron deficiency anemia due to chronic blood loss 11/04/2016  . Metrorrhagia   . Neuropathy   . Obesity   . Ovarian cancer ([place]) 48  . 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 Raynaud's syndrome   . Right groin mass   . Squamous cell carcinoma of skin   . Vision abnormalities   . Vitamin D deficiency    Past Surgical History:  Procedure Laterality Date  . ABDOMINAL HYSTERECTOMY  05/2008   TAH/BSO  . BREAST REDUCTION SURGERY  2000  . CESAREAN SECTION     1991  . COLONOSCOPY  2004; 12/07/10   hemorrhoids  . GASTRIC BYPASS  1979  . TONSILLECTOMY     Social History   Social History Narrative   Divorced   1 son /1 adopted adult son  2 dogs    Sales occupation on disability for now   Regular exercise-yes money stress   Daily caffeine use 2-3 and 2 cups of coffee   On since a security disability since 2012   Patient is right-handed.   Patient has a Master's degree.   family history includes Breast cancer in her maternal grandmother; Hyperlipidemia in an other family member; Hypertension in her father; Pulmonary embolism in her mother.   Review of Systems As per HPI.   Having short-term memory problems has neuropathy after chemotherapy  all other review of systems appears negative  Objective:   Physical  Exam @BP  122/80   Pulse 74   Ht 5' 7"  (1.702 m)   Wt 224 lb (101.6 kg)   BMI 35.08 kg/m @  General:  NAD Eyes:   anicteric Lungs:  clear Heart::  S1S2 no rubs, murmurs or gallops Abdomen:  Obese, soft and nontender, BS+ Ext:   no  cyanosis or clubbing Psych:  Appropriate mood and affect   Data Reviewed:  See HPI

## 2018-06-17 NOTE — Patient Instructions (Addendum)
  If you are age 58 or younger, your body mass index should be between 19-25. Your Body mass index is 35.08 kg/m. If this is out of the aformentioned range listed, please consider follow up with your Primary Care Provider.    You have been scheduled for an endoscopy. Please follow written instructions given to you at your visit today. If you use inhalers (even only as needed), please bring them with you on the day of your procedure.   You will be due for a recall colonoscopy in July 2022. We will send you a reminder in the mail when it gets closer to that time.   I appreciate the opportunity to care for you. Silvano Rusk, MD, [facility]

## 2018-06-19 DIAGNOSIS — H52203 Unspecified astigmatism, bilateral: Secondary | ICD-10-CM | POA: Diagnosis not present

## 2018-06-19 DIAGNOSIS — H2513 Age-related nuclear cataract, bilateral: Secondary | ICD-10-CM | POA: Diagnosis not present

## 2018-06-30 ENCOUNTER — Encounter: Payer: Self-pay | Admitting: Internal Medicine

## 2018-07-02 ENCOUNTER — Telehealth: Payer: Self-pay | Admitting: Internal Medicine

## 2018-07-02 NOTE — Telephone Encounter (Signed)
Per sch msg, called patient to schedule flush, spoke with patient, confirmed time and dates.

## 2018-07-05 ENCOUNTER — Other Ambulatory Visit: Payer: Self-pay | Admitting: Family

## 2018-07-05 ENCOUNTER — Other Ambulatory Visit: Payer: Self-pay | Admitting: Internal Medicine

## 2018-07-06 ENCOUNTER — Inpatient Hospital Stay: Payer: Medicare Other | Attending: Hematology & Oncology

## 2018-07-06 DIAGNOSIS — G63 Polyneuropathy in diseases classified elsewhere: Secondary | ICD-10-CM

## 2018-07-06 DIAGNOSIS — C801 Malignant (primary) neoplasm, unspecified: Secondary | ICD-10-CM

## 2018-07-06 DIAGNOSIS — Z8589 Personal history of malignant neoplasm of other organs and systems: Secondary | ICD-10-CM | POA: Diagnosis not present

## 2018-07-06 DIAGNOSIS — D500 Iron deficiency anemia secondary to blood loss (chronic): Secondary | ICD-10-CM

## 2018-07-06 DIAGNOSIS — Z452 Encounter for adjustment and management of vascular access device: Secondary | ICD-10-CM | POA: Diagnosis not present

## 2018-07-06 DIAGNOSIS — Z95828 Presence of other vascular implants and grafts: Secondary | ICD-10-CM | POA: Insufficient documentation

## 2018-07-06 MED ORDER — HEPARIN SOD (PORK) LOCK FLUSH 100 UNIT/ML IV SOLN
500.0000 [IU] | Freq: Once | INTRAVENOUS | Status: AC | PRN
  Administered 2018-07-06: 500 [IU]
  Filled 2018-07-06: qty 5

## 2018-07-06 MED ORDER — SODIUM CHLORIDE 0.9% FLUSH
10.0000 mL | INTRAVENOUS | Status: DC | PRN
  Administered 2018-07-06: 10 mL
  Filled 2018-07-06: qty 10

## 2018-07-06 NOTE — Telephone Encounter (Signed)
Last filled:03/04/18 Last OV:06/16/2018 Upcoming appt:none at this time

## 2018-07-07 ENCOUNTER — Ambulatory Visit
Admission: RE | Admit: 2018-07-07 | Discharge: 2018-07-07 | Disposition: A | Discharge status: Home / Self Care | Payer: Medicare Other | Source: Ambulatory Visit | Attending: Hematology & Oncology | Admitting: Hematology & Oncology

## 2018-07-07 DIAGNOSIS — Z1231 Encounter for screening mammogram for malignant neoplasm of breast: Secondary | ICD-10-CM

## 2018-07-08 ENCOUNTER — Other Ambulatory Visit: Payer: Self-pay | Admitting: Hematology & Oncology

## 2018-07-08 DIAGNOSIS — R928 Other abnormal and inconclusive findings on diagnostic imaging of breast: Secondary | ICD-10-CM

## 2018-07-10 ENCOUNTER — Encounter: Payer: Self-pay | Admitting: Podiatry

## 2018-07-10 ENCOUNTER — Ambulatory Visit: Payer: Medicare Other | Admitting: Podiatry

## 2018-07-10 VITALS — BP 121/87 | HR 75 | Resp 16

## 2018-07-10 DIAGNOSIS — L600 Ingrowing nail: Secondary | ICD-10-CM | POA: Diagnosis not present

## 2018-07-10 MED ORDER — NEOMYCIN-POLYMYXIN-HC 3.5-10000-1 OT SOLN: OTIC | 0 refills | Status: DC

## 2018-07-10 NOTE — Patient Instructions (Signed)

## 2018-07-10 NOTE — Progress Notes (Signed)
   Subjective:    Patient ID: [name], female    DOB: April 16, 1961, 58 y.o.   MRN: [id number]  HPI    Review of Systems  All other systems reviewed and are negative.      Objective:   Physical Exam        Assessment & Plan:

## 2018-07-11 ENCOUNTER — Other Ambulatory Visit: Payer: Self-pay | Admitting: Internal Medicine

## 2018-07-11 NOTE — Progress Notes (Signed)
Subjective:   Patient ID: [name], female   DOB: 58 y.o.   MRN: [id number]   HPI Patient presents with painful ingrown toenail deformity of both feet and states that he has tried Epson salts and soaks and is been going on for at least 6 months.  Patient smokes 1 pack/day and does like to be active   Review of Systems  All other systems reviewed and are negative.       Objective:  Physical Exam Vitals signs and nursing note reviewed.  Constitutional:      Appearance: She is well-developed.  Pulmonary:     Effort: Pulmonary effort is normal.  Musculoskeletal: Normal range of motion.  Skin:    General: Skin is warm.  Neurological:     Mental Status: She is alert.     Neurovascular status intact muscle strength is adequate range of motion within normal limits with patient found to have incurvated medial border of hallux bilateral that are painful when pressed and make it difficult to wear shoe gear and she cannot trim them anyway.  There was no redness or drainage noted associated with this and patient is found to have good digital perfusion and is well oriented x3     Assessment:  Ingrown toenail deformity hallux bilateral medial border     Plan:  H&P condition reviewed and recommended removal of the nail borders.  I explained procedure risk and patient signed consent form understanding risk and today I infiltrated the hallux each toe 60 mg like Marcaine mixture sterile prep applied to each digit and using sterile instrumentation I remove the medial border exposed matrix and applied phenol 3 applications 30 seconds followed by alcohol lavage and sterile dressing.  Gave instructions on leaving dressing on 24 hours but to take it off earlier if it should start to drain and also instructed on drop usage and encouraged to call with any questions concerns

## 2018-07-13 ENCOUNTER — Ambulatory Visit
Admission: RE | Admit: 2018-07-13 | Discharge: 2018-07-13 | Disposition: A | Discharge status: Home / Self Care | Payer: Medicare Other | Source: Ambulatory Visit | Attending: Hematology & Oncology | Admitting: Hematology & Oncology

## 2018-07-13 ENCOUNTER — Other Ambulatory Visit: Payer: Self-pay | Admitting: Hematology & Oncology

## 2018-07-13 DIAGNOSIS — R928 Other abnormal and inconclusive findings on diagnostic imaging of breast: Secondary | ICD-10-CM

## 2018-07-13 DIAGNOSIS — N6320 Unspecified lump in the left breast, unspecified quadrant: Secondary | ICD-10-CM

## 2018-07-13 DIAGNOSIS — N6489 Other specified disorders of breast: Secondary | ICD-10-CM | POA: Diagnosis not present

## 2018-07-14 ENCOUNTER — Ambulatory Visit (AMBULATORY_SURGERY_CENTER): Payer: Medicare Other | Admitting: Internal Medicine

## 2018-07-14 ENCOUNTER — Other Ambulatory Visit: Payer: Self-pay

## 2018-07-14 ENCOUNTER — Encounter: Payer: Self-pay | Admitting: Internal Medicine

## 2018-07-14 VITALS — BP 118/79 | HR 73 | Temp 96.2°F | Resp 13 | Ht 67.0 in | Wt 224.0 lb

## 2018-07-14 DIAGNOSIS — K2980 Duodenitis without bleeding: Secondary | ICD-10-CM

## 2018-07-14 DIAGNOSIS — K210 Gastro-esophageal reflux disease with esophagitis: Secondary | ICD-10-CM

## 2018-07-14 DIAGNOSIS — K219 Gastro-esophageal reflux disease without esophagitis: Secondary | ICD-10-CM | POA: Diagnosis not present

## 2018-07-14 DIAGNOSIS — K2970 Gastritis, unspecified, without bleeding: Secondary | ICD-10-CM | POA: Diagnosis not present

## 2018-07-14 DIAGNOSIS — K3189 Other diseases of stomach and duodenum: Secondary | ICD-10-CM | POA: Diagnosis not present

## 2018-07-14 MED ORDER — ESOMEPRAZOLE MAGNESIUM 40 MG PO CPDR: 40.0000 mg | DELAYED_RELEASE_CAPSULE | Freq: Every day | ORAL | 3 refills | Status: DC

## 2018-07-14 MED ORDER — SODIUM CHLORIDE 0.9 % IV SOLN: 500.0000 mL | Freq: Once | INTRAVENOUS | Status: DC

## 2018-07-14 NOTE — Progress Notes (Signed)
Spontaneous respirations throughout. VSS. Resting comfortably. To PACU on room air. Report to  RN. 

## 2018-07-14 NOTE — Patient Instructions (Addendum)
There is inflammation in the esophagus, stomach and duodenum.  I took biopsies to understand better.  I am going to order an upper GI series also - I think we talked about this. Will let us understand how much of a hiatal hernia you have.  I sent a prescription for esomeprazole to your pharmacy to treat reflux and help heal things.  I appreciate the opportunity to care for you. Gatha Mayer, MD, FACG   YOU HAD AN ENDOSCOPIC PROCEDURE TODAY AT [facility]:   Refer to the procedure report that was given to you for any specific questions about what was found during the examination.  If the procedure report does not answer your questions, please call your gastroenterologist to clarify.  If you requested that your care partner not be given the details of your procedure findings, then the procedure report has been included in a sealed envelope for you to review at your convenience later.  YOU SHOULD EXPECT: Some feelings of bloating in the abdomen. Passage of more gas than usual.  Walking can help get rid of the air that was put into your GI tract during the procedure and reduce the bloating. If you had a lower endoscopy (such as a colonoscopy or flexible sigmoidoscopy) you may notice spotting of blood in your stool or on the toilet paper. If you underwent a bowel prep for your procedure, you may not have a normal bowel movement for a few days.  Please Note:  You might notice some irritation and congestion in your nose or some drainage.  This is from the oxygen used during your procedure.  There is no need for concern and it should clear up in a day or so.  SYMPTOMS TO REPORT IMMEDIATELY:   Following upper endoscopy (EGD)  Vomiting of blood or coffee ground material  New chest pain or pain under the shoulder blades  Painful or persistently difficult swallowing  New shortness of breath  Fever of 100F or higher  Black, tarry-looking stools  For urgent or emergent  issues, a gastroenterologist can be reached at any hour by calling [phone].   DIET:  We do recommend a small meal at first, but then you may proceed to your regular diet.  Drink plenty of fluids but you should avoid alcoholic beverages for 24 hours.  ACTIVITY:  You should plan to take it easy for the rest of today and you should NOT DRIVE or use heavy machinery until tomorrow (because of the sedation medicines used during the test).    FOLLOW UP: Our staff will call the number listed on your records the next business day following your procedure to check on you and address any questions or concerns that you may have regarding the information given to you following your procedure. If we do not reach you, we will leave a message.  However, if you are feeling well and you are not experiencing any problems, there is no need to return our call.  We will assume that you have returned to your regular daily activities without incident.  If any biopsies were taken you will be contacted by phone or by letter within the next 1-3 weeks.  Please call us at [phone] if you have not heard about the biopsies in 3 weeks.    SIGNATURES/CONFIDENTIALITY: You and/or your care partner have signed paperwork which will be entered into your electronic medical record.  These signatures attest to the fact that that the information  above on your After Visit Summary has been reviewed and is understood.  Full responsibility of the confidentiality of this discharge information lies with you and/or your care-partner.

## 2018-07-14 NOTE — Progress Notes (Signed)
Called to room to assist during endoscopic procedure.  Patient ID and intended procedure confirmed with present staff. Received instructions for my participation in the procedure from the performing physician.  

## 2018-07-15 ENCOUNTER — Other Ambulatory Visit: Payer: Self-pay | Admitting: Hematology & Oncology

## 2018-07-15 ENCOUNTER — Ambulatory Visit
Admission: RE | Admit: 2018-07-15 | Discharge: 2018-07-15 | Disposition: A | Discharge status: Home / Self Care | Payer: Medicare Other | Source: Ambulatory Visit | Attending: Hematology & Oncology | Admitting: Hematology & Oncology

## 2018-07-15 ENCOUNTER — Telehealth: Payer: Self-pay

## 2018-07-15 ENCOUNTER — Other Ambulatory Visit: Payer: Self-pay

## 2018-07-15 DIAGNOSIS — K449 Diaphragmatic hernia without obstruction or gangrene: Secondary | ICD-10-CM

## 2018-07-15 DIAGNOSIS — Z9884 Bariatric surgery status: Secondary | ICD-10-CM

## 2018-07-15 DIAGNOSIS — K219 Gastro-esophageal reflux disease without esophagitis: Secondary | ICD-10-CM

## 2018-07-15 DIAGNOSIS — N6320 Unspecified lump in the left breast, unspecified quadrant: Secondary | ICD-10-CM

## 2018-07-15 DIAGNOSIS — N6082 Other benign mammary dysplasias of left breast: Secondary | ICD-10-CM | POA: Diagnosis not present

## 2018-07-15 DIAGNOSIS — N6323 Unspecified lump in the left breast, lower outer quadrant: Secondary | ICD-10-CM | POA: Diagnosis not present

## 2018-07-15 NOTE — Telephone Encounter (Signed)
  Follow up Call-  Call back number 07/14/2018  Post procedure Call Back phone  # [phone]  Permission to leave phone message Yes  Some recent data might be hidden     Patient questions:  Do you have a fever, pain , or abdominal swelling? No. Pain Score  0 *  Have you tolerated food without any problems? Yes.    Have you been able to return to your normal activities? Yes.    Do you have any questions about your discharge instructions: Diet   No. Medications  No. Follow up visit  No.  Do you have questions or concerns about your Care? No.  Actions: * If pain score is 4 or above: No action needed, pain <4.   No problems noted per pt. maw

## 2018-07-15 NOTE — Telephone Encounter (Signed)
Patient has been scheduled for UGI series per procedure note on 07/14/18. She is scheduled for 08/03/18 9:00 arrival for 9:15 appt.  She needs to be NPO and register at radiology at [facility].   Left message for patient to call back

## 2018-07-15 NOTE — Op Note (Addendum)
[place] Patient Name: [name] Procedure Date: 07/14/2018 10:33 AM MRN: [id number] Endoscopist: Gatha Mayer , MD Age: 58 Referring MD:  Date of Birth: 1960/12/07 Gender: Female Account #: [id number] Procedure:                Upper GI endoscopy Indications:              Heartburn, Suspected gastro-esophageal reflux                            disease, Suspected hiatal hernia Medicines:                Propofol per Anesthesia, Monitored Anesthesia Care Procedure:                Pre-Anesthesia Assessment:                           - Prior to the procedure, a History and Physical                            was performed, and patient medications and                            allergies were reviewed. The patient's tolerance of                            previous anesthesia was also reviewed. The risks                            and benefits of the procedure and the sedation                            options and risks were discussed with the patient.                            All questions were answered, and informed consent                            was obtained. Prior Anticoagulants: The patient has                            taken no previous anticoagulant or antiplatelet                            agents. ASA Grade Assessment: II - A patient with                            mild systemic disease. After reviewing the risks                            and benefits, the patient was deemed in                            satisfactory condition to undergo the procedure.  After obtaining informed consent, the endoscope was                            passed under direct vision. Throughout the                            procedure, the patient's blood pressure, pulse, and                            oxygen saturations were monitored continuously. The                            Endoscope was introduced through the mouth, and   advanced to the second part of duodenum. The upper                            GI endoscopy was accomplished without difficulty.                            The patient tolerated the procedure well. Scope In: Scope Out: Findings:                 LA Grade B (one or more mucosal breaks greater than                            5 mm, not extending between the tops of two mucosal                            folds) esophagitis was found in the distal                            esophagus. Biopsies were taken with a cold forceps                            for histology. Verification of patient                            identification for the specimen was done. Estimated                            blood loss was minimal.                           A few localized small erosions were found in the                            cardia. Biopsies were taken with a cold forceps for                            histology. Verification of patient identification                            for the specimen was done. Estimated blood loss was  minimal.                           Patchy mild inflammation characterized by erythema                            and friability was found in the gastric antrum.                            Biopsies were taken with a cold forceps for                            histology. Verification of patient identification                            for the specimen was done. Estimated blood loss was                            minimal.                           Patchy mild inflammation characterized by erythema                            and granularity was found in the duodenal bulb and                            in the second portion of the duodenum. Biopsies                            were taken with a cold forceps for histology.                            Verification of patient identification for the                            specimen was done. Estimated blood  loss was minimal.                           Evidence of a gastroplasty was found. A gastric                            pouch with a 2 cm length from the GE junction to                            the gastrojejunal anastomosis was found.                           The exam was otherwise without abnormality.                            Including retroflexion. Complications:            No immediate complications. Estimated Blood Loss:     Estimated blood loss was minimal. Impression:               -  LA Grade B reflux esophagitis. Biopsied.                           - Erosive gastropathy. Biopsied.                           - Gastritis. Biopsied.                           - Duodenitis. Biopsied.                           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                            She had obesity surgery many years ago                           - The examination was otherwise normal. Recommendation:           - Patient has a contact number available for                            emergencies. The signs and symptoms of potential                            delayed complications were discussed with the                            patient. Return to normal activities tomorrow.                            Written discharge instructions were provided to the                            patient.                           - Resume previous diet.                           - Continue present medications.                           - Await pathology results.                           - Use Protonix (pantoprazole) 40 mg PO daily.                           MY OFFICE WILL SCHEDULE UPPER GI SERIES - ? HIATAL                            HERNIA, GERD PROBLEMS AFTER BARIATRIC SURGERY Gatha Mayer, MD 07/14/2018 11:08:12 AM This report has been signed electronically.

## 2018-07-16 NOTE — Telephone Encounter (Signed)
Pt called back and stated that she has the information and understand.

## 2018-07-17 ENCOUNTER — Encounter: Payer: Self-pay | Admitting: Internal Medicine

## 2018-07-17 NOTE — Progress Notes (Signed)
Some inflammation on duodenal and gastric bxs My Chart letter - no recall

## 2018-07-29 DIAGNOSIS — H25013 Cortical age-related cataract, bilateral: Secondary | ICD-10-CM | POA: Diagnosis not present

## 2018-07-29 DIAGNOSIS — H2511 Age-related nuclear cataract, right eye: Secondary | ICD-10-CM | POA: Diagnosis not present

## 2018-07-29 DIAGNOSIS — H25043 Posterior subcapsular polar age-related cataract, bilateral: Secondary | ICD-10-CM | POA: Diagnosis not present

## 2018-07-29 DIAGNOSIS — H2513 Age-related nuclear cataract, bilateral: Secondary | ICD-10-CM | POA: Diagnosis not present

## 2018-07-30 ENCOUNTER — Telehealth: Payer: Self-pay | Admitting: *Deleted

## 2018-07-30 NOTE — Telephone Encounter (Signed)
Patient has been scheduled for upper GI series at [facility] on 08/03/18 for evaluation of possible hiatal hernia and gerd after bariatric surgery. Unfortunately, due to COVID-19 pandemic, [place] requests that all non-emergent procedures be cancelled or rescheduled at this time. Please advise as to whether this procedure can be rescheduled out by at least 4 weeks.Marland KitchenMarland KitchenMarland Kitchen

## 2018-07-31 NOTE — Telephone Encounter (Signed)
It can wait

## 2018-08-03 ENCOUNTER — Ambulatory Visit (HOSPITAL_COMMUNITY): Payer: Medicare Other

## 2018-08-03 NOTE — Telephone Encounter (Signed)
I have spoken to patient to advise that once we are allowed to rescheduled testing, we will contact her to do so. She verbalizes understanding.

## 2018-08-17 ENCOUNTER — Inpatient Hospital Stay: Payer: Medicare Other | Attending: Hematology & Oncology

## 2018-08-17 ENCOUNTER — Other Ambulatory Visit: Payer: Self-pay

## 2018-08-17 DIAGNOSIS — Z452 Encounter for adjustment and management of vascular access device: Secondary | ICD-10-CM | POA: Diagnosis not present

## 2018-08-17 DIAGNOSIS — Z8589 Personal history of malignant neoplasm of other organs and systems: Secondary | ICD-10-CM | POA: Diagnosis not present

## 2018-08-17 DIAGNOSIS — Z95828 Presence of other vascular implants and grafts: Secondary | ICD-10-CM

## 2018-08-17 DIAGNOSIS — C801 Malignant (primary) neoplasm, unspecified: Secondary | ICD-10-CM

## 2018-08-17 DIAGNOSIS — D500 Iron deficiency anemia secondary to blood loss (chronic): Secondary | ICD-10-CM

## 2018-08-17 DIAGNOSIS — G63 Polyneuropathy in diseases classified elsewhere: Secondary | ICD-10-CM

## 2018-08-17 MED ORDER — SODIUM CHLORIDE 0.9% FLUSH
10.0000 mL | INTRAVENOUS | Status: DC | PRN
  Administered 2018-08-17: 16:00:00 10 mL
  Filled 2018-08-17: qty 10

## 2018-08-17 MED ORDER — HEPARIN SOD (PORK) LOCK FLUSH 100 UNIT/ML IV SOLN
500.0000 [IU] | Freq: Once | INTRAVENOUS | Status: AC | PRN
  Administered 2018-08-17: 500 [IU]
  Filled 2018-08-17: qty 5

## 2018-08-27 ENCOUNTER — Other Ambulatory Visit: Payer: Self-pay

## 2018-08-27 ENCOUNTER — Encounter: Payer: Self-pay | Admitting: Adult Health

## 2018-08-27 ENCOUNTER — Ambulatory Visit (INDEPENDENT_AMBULATORY_CARE_PROVIDER_SITE_OTHER): Payer: Medicare Other | Admitting: Adult Health

## 2018-08-27 DIAGNOSIS — S0120XA Unspecified open wound of nose, initial encounter: Secondary | ICD-10-CM | POA: Diagnosis not present

## 2018-08-27 MED ORDER — DOXYCYCLINE HYCLATE 100 MG PO CAPS: 100.0000 mg | ORAL_CAPSULE | Freq: Two times a day (BID) | ORAL | 0 refills | Status: DC

## 2018-08-27 MED ORDER — VALACYCLOVIR HCL 1 G PO TABS: 1000.0000 mg | ORAL_TABLET | Freq: Three times a day (TID) | ORAL | 0 refills | Status: AC

## 2018-08-27 NOTE — Progress Notes (Signed)
Virtual Visit via Video Note  I connected with [name] on 08/27/18 at 10:30 AM EDT by a video enabled telemedicine application and verified that I am speaking with the correct person using two identifiers.  Location patient: home Location provider:work or home office Persons participating in the virtual visit: patient, provider  I discussed the limitations of evaluation and management by telemedicine and the availability of in person appointments. The patient expressed understanding and agreed to proceed.   HPI: This is a 58 year old female who is being evaluated today for the complaint of wounds on the tip of her nose.  She reports that approximately 2 weeks ago she was experiencing sinus pain and pressure this is since resolved slightly but over the last 2 days she is noticed 2 small blisters on the tip of the right nare.  These blisters are painful and have clear yellow drainage.  She also did have one "sore" inside of her right nare.  She has been applying bacitracin in the sore on the inside of her right nare has improved but the 2 on the tip continue to be painful.  She does report having shingles 3 times and has had the shingles vaccination.  Per patient the pain is as bad as when she is had shingles in the past.  Does have clear rhinorrhea.  No worsening blurred vision, she was post to have cataract surgery done but this was postponed due to [place].  Is reports being extremely stressed over the last few weeks, she has not been sleeping due to suffering from chronic insomnia and also is dealing with the death of a family member from [place].   ROS: See pertinent positives and negatives per HPI.  Past Medical History:  Diagnosis Date  . 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 Adjustment disorder   . Allergic rhinitis   . Alopecia   . Anxiety and depression   . 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 HLD (hyperlipidemia)   . Hx pulmonary embolism   .  Hypertension   . Iron deficiency anemia due to chronic blood loss 11/04/2016  . Metrorrhagia   . Neuropathy   . Obesity   . Ovarian cancer ([place]) 04/2008   ChemoTherapy  . 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 Raynaud's syndrome   . Right groin mass   . Squamous cell carcinoma of skin   . Vision abnormalities   . Vitamin D deficiency     Past Surgical History:  Procedure Laterality Date  . ABDOMINAL HYSTERECTOMY  05/2008   TAH/BSO  . AUGMENTATION MAMMAPLASTY Bilateral 10+ years  . BREAST REDUCTION SURGERY  2000  . CESAREAN SECTION     1991  . COLONOSCOPY  2004; 12/07/10   hemorrhoids  . ESOPHAGOGASTRODUODENOSCOPY    . GASTRIC BYPASS  1979  . TOENAIL EXCISION Bilateral   . TONSILLECTOMY      Family History  Problem Relation Age of Onset  . Pulmonary embolism Mother   . Hypertension Father   . Breast cancer Maternal Grandmother        diagnosed in early 68s  . Hyperlipidemia Other   . Breast cancer Sister 66       Current Outpatient Medications:  .  aspirin 81 MG tablet, Take 81 mg by mouth daily. 2 TABS IN AM, Disp: , Rfl:  .  butalbital-acetaminophen-caffeine (FIORICET, ESGIC) 50-325-40 MG tablet, TAKE 1 TABLET BY MOUTH EVERY 6 HOURS AS NEEDED FOR HEADACHE (Patient not taking: Reported  on 07/14/2018), Disp: 10 tablet, Rfl: 3 .  calcium carbonate (TUMS - DOSED IN MG ELEMENTAL CALCIUM) 500 MG chewable tablet, Chew 1 tablet by mouth daily as needed for indigestion or heartburn., Disp: , Rfl:  .  diclofenac (VOLTAREN) 75 MG EC tablet, TK 1 T PO BID AFTER MEALS FOR INFLAMMATION / PAIN / SWELLING PRN ONLY, Disp: , Rfl: 1 .  donepezil (ARICEPT) 10 MG tablet, Take 1 tablet (10 mg total) by mouth every morning., Disp: 90 tablet, Rfl: 1 .  doxycycline (VIBRAMYCIN) 100 MG capsule, Take 1 capsule (100 mg total) by mouth 2 (two) times daily., Disp: 14 capsule, Rfl: 0 .  DULoxetine (CYMBALTA) 60 MG capsule, TAKE ONE CAPSULE BY MOUTH DAILY, Disp: 90 capsule, Rfl: 0 .   esomeprazole (NEXIUM) 40 MG capsule, Take 1 capsule (40 mg total) by mouth daily before breakfast., Disp: 90 capsule, Rfl: 3 .  KLOR-CON M10 10 MEQ tablet, TAKE 2 TABLETS BY MOUTH DAILY, Disp: 60 tablet, Rfl: 0 .  Multiple Vitamin (MULTIVITAMIN WITH MINERALS) TABS tablet, Take 1 tablet by mouth daily., Disp: , Rfl:  .  neomycin-polymyxin-hydrocortisone (CORTISPORIN) OTIC solution, Apply 1-2 drops to toe after soaking twice a day (Patient not taking: Reported on 07/14/2018), Disp: 10 mL, Rfl: 0 .  orphenadrine (NORFLEX) 100 MG tablet, TAKE 1 TABLET BY MOUTH TWICE DAILY AS NEEDED FOR MUSCLE SPASMS, Disp: 180 tablet, Rfl: 0 .  pregabalin (LYRICA) 200 MG capsule, TAKE ONE CAPSULE BY MOUTH TWICE DAILY, Disp: 60 capsule, Rfl: 2 .  simvastatin (ZOCOR) 40 MG tablet, TAKE 1 TABLET BY MOUTH EVERY DAY, Disp: 90 tablet, Rfl: 0 .  triamterene-hydrochlorothiazide (MAXZIDE-25) 37.5-25 MG tablet, TAKE 2 TABLETS BY MOUTH DAILY, Disp: 180 tablet, Rfl: 0 .  valACYclovir (VALTREX) 1000 MG tablet, Take 1 tablet (1,000 mg total) by mouth 3 (three) times daily for 7 days., Disp: 21 tablet, Rfl: 0 .  varenicline (CHANTIX STARTING MONTH PAK) 0.5 MG X 11 & 1 MG X 42 tablet, Take 0.5 mg tablet once daily for 3 days, then increase to 0.5 mg tablet twice daily for 4 days, then increase to 1 mg tablet twice daily., Disp: 53 tablet, Rfl: 0 .  varenicline (CHANTIX) 1 MG tablet, Take 1 tablet (1 mg total) by mouth 2 (two) times daily., Disp: 60 tablet, Rfl: 3 .  Vitamin D, Ergocalciferol, (DRISDOL) 1.25 MG (50000 UT) CAPS capsule, TAKE 1 CAPSULE BY MOUTH ONCE WEEKLY, Disp: 12 capsule, Rfl: 0 .  zolpidem (AMBIEN) 5 MG tablet, Take 1-2 tablets (5-10 mg total) by mouth at bedtime as needed., Disp: 60 tablet, Rfl: 3  EXAM:  VITALS per patient if applicable:  GENERAL: alert, oriented, appears well and in no acute distress  HEENT: atraumatic, conjunttiva clear, no obvious abnormalities on inspection of external nose and ears  NECK:  normal movements of the head and neck  LUNGS: on inspection no signs of respiratory distress, breathing rate appears normal, no obvious gross SOB, gasping or wheezing  CV: no obvious cyanosis  MS: moves all visible extremities without noticeable abnormality  PSYCH/NEURO: pleasant and cooperative, no obvious depression or anxiety, speech and thought processing grossly intact  SKIN: Two small sores localized erythema on the tip of the right nare.  No active drainage noted.  Mild swelling noted across bridge of nose  ASSESSMENT AND PLAN:  Discussed the following assessment and plan:  Treat with doxycycline for possible lytic infection.  Will also cover for rare possibility she is having a shingles outbreak for  the fourth time.  Advise close follow-up if no improvement in the next 24 hours.  Open wound of nose, unspecified open wound type, initial encounter - Plan: doxycycline (VIBRAMYCIN) 100 MG capsule, valACYclovir (VALTREX) 1000 MG tablet     I discussed the assessment and treatment plan with the patient. The patient was provided an opportunity to ask questions and all were answered. The patient agreed with the plan and demonstrated an understanding of the instructions.   The patient was advised to call back or seek an in-person evaluation if the symptoms worsen or if the condition fails to improve as anticipated.    , NP   

## 2018-09-03 NOTE — Telephone Encounter (Signed)
UGI series has been rescheduled to 10/21/18 11:00 at [facility].  She is notified of the appt dates and times.

## 2018-09-04 ENCOUNTER — Other Ambulatory Visit: Payer: Self-pay | Admitting: Internal Medicine

## 2018-09-04 ENCOUNTER — Other Ambulatory Visit: Payer: Self-pay | Admitting: Family

## 2018-09-04 ENCOUNTER — Other Ambulatory Visit: Payer: Self-pay | Admitting: Hematology & Oncology

## 2018-09-28 DIAGNOSIS — H2512 Age-related nuclear cataract, left eye: Secondary | ICD-10-CM | POA: Diagnosis not present

## 2018-09-28 DIAGNOSIS — H2511 Age-related nuclear cataract, right eye: Secondary | ICD-10-CM | POA: Diagnosis not present

## 2018-09-28 DIAGNOSIS — H25812 Combined forms of age-related cataract, left eye: Secondary | ICD-10-CM | POA: Diagnosis not present

## 2018-09-29 ENCOUNTER — Other Ambulatory Visit: Payer: Self-pay | Admitting: Family

## 2018-10-02 ENCOUNTER — Other Ambulatory Visit: Payer: Self-pay | Admitting: Family

## 2018-10-02 ENCOUNTER — Other Ambulatory Visit: Payer: Self-pay | Admitting: Internal Medicine

## 2018-10-06 DIAGNOSIS — H2512 Age-related nuclear cataract, left eye: Secondary | ICD-10-CM | POA: Diagnosis not present

## 2018-10-06 DIAGNOSIS — H2511 Age-related nuclear cataract, right eye: Secondary | ICD-10-CM | POA: Diagnosis not present

## 2018-10-06 DIAGNOSIS — H25811 Combined forms of age-related cataract, right eye: Secondary | ICD-10-CM | POA: Diagnosis not present

## 2018-10-08 ENCOUNTER — Telehealth: Payer: Self-pay | Admitting: Hematology & Oncology

## 2018-10-08 ENCOUNTER — Other Ambulatory Visit: Payer: Self-pay | Admitting: Internal Medicine

## 2018-10-08 NOTE — Telephone Encounter (Signed)
Faxed medical records to: [facility] P: [phone] F: [phone]  for   [name] [facility] CASE: [id number] 06-05-1960      COPY SCANNED

## 2018-10-15 ENCOUNTER — Inpatient Hospital Stay: Payer: Medicare Other | Attending: Hematology & Oncology

## 2018-10-15 ENCOUNTER — Inpatient Hospital Stay: Payer: Medicare Other

## 2018-10-15 ENCOUNTER — Inpatient Hospital Stay: Payer: Medicare Other | Admitting: Hematology & Oncology

## 2018-10-15 ENCOUNTER — Other Ambulatory Visit: Payer: Self-pay

## 2018-10-15 DIAGNOSIS — Z8589 Personal history of malignant neoplasm of other organs and systems: Secondary | ICD-10-CM | POA: Diagnosis not present

## 2018-10-15 DIAGNOSIS — Z95828 Presence of other vascular implants and grafts: Secondary | ICD-10-CM

## 2018-10-15 DIAGNOSIS — C801 Malignant (primary) neoplasm, unspecified: Secondary | ICD-10-CM

## 2018-10-15 DIAGNOSIS — Z452 Encounter for adjustment and management of vascular access device: Secondary | ICD-10-CM | POA: Insufficient documentation

## 2018-10-15 DIAGNOSIS — D500 Iron deficiency anemia secondary to blood loss (chronic): Secondary | ICD-10-CM

## 2018-10-15 DIAGNOSIS — G63 Polyneuropathy in diseases classified elsewhere: Secondary | ICD-10-CM

## 2018-10-15 MED ORDER — SODIUM CHLORIDE 0.9% FLUSH
10.0000 mL | INTRAVENOUS | Status: DC | PRN
  Administered 2018-10-15: 10 mL
  Filled 2018-10-15: qty 10

## 2018-10-15 MED ORDER — HEPARIN SOD (PORK) LOCK FLUSH 100 UNIT/ML IV SOLN
500.0000 [IU] | Freq: Once | INTRAVENOUS | Status: AC | PRN
  Administered 2018-10-15: 500 [IU]
  Filled 2018-10-15: qty 5

## 2018-10-21 ENCOUNTER — Other Ambulatory Visit: Payer: Self-pay

## 2018-10-21 ENCOUNTER — Ambulatory Visit (HOSPITAL_COMMUNITY)
Admission: RE | Admit: 2018-10-21 | Discharge: 2018-10-21 | Disposition: A | Discharge status: Home / Self Care | Payer: Medicare Other | Source: Ambulatory Visit | Attending: Internal Medicine | Admitting: Internal Medicine

## 2018-10-21 DIAGNOSIS — Z9884 Bariatric surgery status: Secondary | ICD-10-CM | POA: Insufficient documentation

## 2018-10-21 DIAGNOSIS — K219 Gastro-esophageal reflux disease without esophagitis: Secondary | ICD-10-CM | POA: Diagnosis not present

## 2018-10-21 DIAGNOSIS — K449 Diaphragmatic hernia without obstruction or gangrene: Secondary | ICD-10-CM | POA: Diagnosis not present

## 2018-10-21 NOTE — Progress Notes (Signed)
Message left - I called - to give results and discuss options Will call her back

## 2018-10-27 ENCOUNTER — Telehealth: Payer: Self-pay | Admitting: Internal Medicine

## 2018-10-27 MED ORDER — SUCRALFATE 1 G PO TABS: 1.0000 g | ORAL_TABLET | Freq: Three times a day (TID) | ORAL | 11 refills | Status: DC

## 2018-10-27 NOTE — Progress Notes (Signed)
[place],  I have called a couple of times and have not been able to reach you.  So this message is to explain that yes you do have part of your stomach and your chest, hiatal hernia.  Fixing this might help you feel better though I would suspect that is complicated.  It is not something I would do, you would need to see a surgeon.  I cannot remember who did your gastric bypass surgery nor can I find it in the chart.  Please respond to this message and let me know who did your surgery, and we can also try to figure out a way to discuss the next steps.  Warm regards,  Gatha Mayer, MD, [facility]

## 2018-10-27 NOTE — Telephone Encounter (Signed)
Patient called stating she was returning your call,

## 2018-10-27 NOTE — Telephone Encounter (Signed)
Adjustable bed has helped significantly  Cannot take Nexium all the time as it altered K levels  She is improved she can use her Nexium as needed and we will add Carafate tablets which can be made into a slurry I explained that.  4 times a day though she can take less if she is all right.

## 2018-11-05 ENCOUNTER — Other Ambulatory Visit: Payer: Self-pay | Admitting: Neurology

## 2018-11-11 ENCOUNTER — Telehealth: Payer: Self-pay | Admitting: *Deleted

## 2018-11-11 DIAGNOSIS — Z0289 Encounter for other administrative examinations: Secondary | ICD-10-CM

## 2018-11-11 NOTE — Telephone Encounter (Signed)
I faxed pt form to [employer] on 11/11/18

## 2018-11-11 NOTE — Telephone Encounter (Signed)
R/C form [employer] pt form on [employer].

## 2018-11-25 ENCOUNTER — Encounter: Payer: Self-pay | Admitting: Neurology

## 2018-11-25 ENCOUNTER — Other Ambulatory Visit: Payer: Self-pay

## 2018-11-25 ENCOUNTER — Ambulatory Visit: Payer: Medicare Other | Admitting: Neurology

## 2018-11-25 VITALS — BP 132/92 | HR 90 | Temp 97.3°F | Ht 68.0 in | Wt 220.0 lb

## 2018-11-25 DIAGNOSIS — H53143 Visual discomfort, bilateral: Secondary | ICD-10-CM | POA: Diagnosis not present

## 2018-11-25 DIAGNOSIS — Z8543 Personal history of malignant neoplasm of ovary: Secondary | ICD-10-CM

## 2018-11-25 DIAGNOSIS — H8113 Benign paroxysmal vertigo, bilateral: Secondary | ICD-10-CM

## 2018-11-25 DIAGNOSIS — F988 Other specified behavioral and emotional disorders with onset usually occurring in childhood and adolescence: Secondary | ICD-10-CM

## 2018-11-25 DIAGNOSIS — F329 Major depressive disorder, single episode, unspecified: Secondary | ICD-10-CM | POA: Diagnosis not present

## 2018-11-25 DIAGNOSIS — G4701 Insomnia due to medical condition: Secondary | ICD-10-CM

## 2018-11-25 DIAGNOSIS — G43109 Migraine with aura, not intractable, without status migrainosus: Secondary | ICD-10-CM | POA: Diagnosis not present

## 2018-11-25 DIAGNOSIS — G3184 Mild cognitive impairment, so stated: Secondary | ICD-10-CM

## 2018-11-25 MED ORDER — DONEPEZIL HCL 10 MG PO TABS: 10.0000 mg | ORAL_TABLET | Freq: Every morning | ORAL | 1 refills | Status: DC

## 2018-11-25 MED ORDER — ZOLPIDEM TARTRATE 5 MG PO TABS: 5.0000 mg | ORAL_TABLET | Freq: Every evening | ORAL | 1 refills | Status: DC | PRN

## 2018-11-25 NOTE — Progress Notes (Addendum)
PATIENT: [name] DOB: 1961/05/04  REASON FOR VISIT: follow up- neuropathy, cognitive changes, insomnia HISTORY FROM: patient  HISTORY OF PRESENT ILLNESS:   Interval history 11-25-2018, I have the pleasure of seeing [name] again, a 58 year old woman of african and caucasian mixed race who reports less trouble sleeping but trouble with memory, progressive.  She has neuropathy, had migraine, is at high risk of falling. 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 She has had spells that were syncopal, and some looking like seizures.  She reports feeling isolated during the corona pandemic, has seen neither children , grandchild, nor  her parents.she is tearful , appears very depressed. She takes Cymbalta for mood.         2016 : [name] is a 58 year old female with a history of neuropathy,cognitive changes and insomnia. She returns today for follow-up. The patient continues to take Ambien for insomnia. The patient states that if she does not take this medication she will not be able to go to sleep. The patient currently takes Lyrica 150 mg twice a day for neuropathy. Patient states that the burning and tingling is located in the toes extending to the mid foot bilaterally. She also has burning and tingling in the fingers that extends to the mid hand. Denies any changes with her gait or balance. That she had a fall while standing and watching fireworks. She states that the right leg went numb and cause her to fall. She states that this is the first time this has happened. She states that the numbness resolved. He states that she is not had any additional episodes of numbness. Patient feels that her memory has remained same. She states that she is able to do simple tasks but things that require more complex thinking she has difficulty with. For example she has trouble following plots in a TV show. She states that she has  directions for how to make coffee posted. She is able to complete all ADLs independently. She operates a Teacher, music without difficulty. She states that occasionally she'll take a wrong turn to a familiar place. She continues to take Aricept 10 mg daily. Patient states that she did have a sleep study area and she is unsure if she should be wearing oxygen at bedtime. She states that she had a dental procedure and the technician noted that her oxygen saturation dropped while she was sleeping. She denies any new neurological issues. She returns today for an evaluation.      HISTORY 05/18/14 (CD): [name] is here today for her yearly revisit she is doing remarkably well knowing about her history of metastatic cancer with unknown primary to more she seems to have battled her disease very well.  She has lost some weight since her last visit, her blood pressure is well controlled. The HST study performed on 10-23-13 showed obstructive sleep apnea not to be present, but some hypoxemia at night there were 109 minutes of oxygenation at 88% or less. Since this test was a home sleep test there is a higher likelihood of artifact. The patient does 1-2 times a night feel air hungry. She does wake occasionally up with a feeling that she doesn't breath enough. And her heart will be racing- palpitations have also not been seen in her home sleep test. She described sudden falls still being a concern, neuropathy contributing to this.  her cognition is improved, by her own  subjective assessment as well as MOCA test today : scored 26 out of 30 points. This is a normal range for her cognitive function. Ambien dependent , unable to go to sleep without sleep aid for several years now.  Her list of medication was reviewed and there have been no recent changes. Her dog is her main social contact , besides her church family.   Interval history from 11/28/2015. [name] is here today and did excellent  on today's memory testing. There is a variability between 26 and 22 points over the last 18 months. She mastered the trail making test, she recalled 3 out of 5 recall words. The patient had been advised in my last visit with her to avoid driving at night, she lives alone and she relies on her own transportation. She has not gotten lost. She has  an exercise regimen and she has implemented dietary changes. She is an excellent mood today and would like to reduce her Ambien from 12 point 5 at night to 10 mg which makes me very happy. I would like for her to continue with the Aricept I will follow her every 6 months.  Interval history from 06/05/2016, [name] at the meanwhile 58 year old woman with a long-standing history for ill-defined consult. She can continue to drive without difficulties and is independent in activities of daily living she reports rhinitis but I attributed to Aricept, falls that seem to be neither syncopal, nor seizure, no mechanical in origin. Her falls are always propulsive, she falls forward she has very little warning or numb and she finds herself dominant how she got to the floor. Her mini entall test score 25 out of 30 points today which is stable and actually improved to a year ago. She is taking Lyrica for neuropathy and Lyrica is also a seizure medicine, I have wondered if Aricept may give her bradycardia but her heart rate today was 92.  Interval history from the tenth of January 2019, I have the pleasure of meeting today with [name], I follow her yearly and we usually look at her degree of fatigue, gait stability and cognitive status.  She scored today on a [place] cognitive assessment 23 out of 30 points and was last year 25 out of 30 points.  She missed 2 of the 5 recall words.  And the day of the week. She had another fall in October 2018 which she considered bad.  She describes that her legs entangled for some reason she fell onto her right hip she did not  suffer fractures just a hematoma.  This happened on a non-slip even floor and she is still not sure what happened. No loss of conscience. Another fall at her sons house on the day before New years. "I sometimes cannot feel my feet and they end up entangled or in the wrong place at the wrong time.  This time I fell and slipped over a coffee table.  05-26-2018.RV , yearly for disability forms.  [name] has become a grandmother 68 month ago and is very happy, son and his family live in [place]. She has survived an unknown primary cancer by now for many years. Still having neuropathy - as expected, and memory problems.   REVIEW OF SYSTEMS: Out of a complete 14 system review of symptoms, the patient complains only of the following symptoms, and all other reviewed systems are negative.  Memory is variable, easily fatigued, easily distracted, depressed. Limited attention.   Foot numbness  and pain, progressive - On 200 Lyrica - but still high fall risk. Chemotherapy induced  Heat intolerance, excessive sweating, insomnia, daytime sleepiness, joint pain, memory loss, numbness due to neuropathy= Chemotherapy induced, para-neoplastic induced ? Marland Kitchen   ALLERGIES: Allergies  Allergen Reactions  . Codeine Nausea Only  . Orange Itching and Rash    HOME MEDICATIONS: Outpatient Medications Prior to Visit  Medication Sig Dispense Refill  . aspirin 81 MG tablet Take 81 mg by mouth daily. 2 TABS IN AM    . butalbital-acetaminophen-caffeine (FIORICET, ESGIC) 50-325-40 MG tablet TAKE 1 TABLET BY MOUTH EVERY 6 HOURS AS NEEDED FOR HEADACHE 10 tablet 3  . calcium carbonate (TUMS - DOSED IN MG ELEMENTAL CALCIUM) 500 MG chewable tablet Chew 1 tablet by mouth daily as needed for indigestion or heartburn.    . CHANTIX 0.5 MG tablet TAKE 1 TABLET BY MOUTH TWICE DAILY 60 tablet 3  . diclofenac (VOLTAREN) 75 MG EC tablet TK 1 T PO BID AFTER MEALS FOR INFLAMMATION / PAIN / SWELLING PRN ONLY  1  . donepezil  (ARICEPT) 10 MG tablet Take 1 tablet (10 mg total) by mouth every morning. 90 tablet 1  . DULoxetine (CYMBALTA) 60 MG capsule TAKE ONE CAPSULE BY MOUTH DAILY 90 capsule 0  . Multiple Vitamin (MULTIVITAMIN WITH MINERALS) TABS tablet Take 1 tablet by mouth daily.    . orphenadrine (NORFLEX) 100 MG tablet TAKE 1 TABLET BY MOUTH TWICE DAILY AS NEEDED FOR MUSCLE SPASMS 180 tablet 0  . potassium chloride (K-DUR) 10 MEQ tablet TAKE 2 TABLETS BY MOUTH EVERY DAY 180 tablet 1  . pregabalin (LYRICA) 200 MG capsule TAKE ONE CAPSULE BY MOUTH TWICE DAILY 60 capsule 3  . simvastatin (ZOCOR) 40 MG tablet TAKE 1 TABLET BY MOUTH EVERY DAY 90 tablet 0  . sucralfate (CARAFATE) 1 g tablet Take 1 tablet (1 g total) by mouth 4 (four) times daily -  with meals and at bedtime. May dissolve/crush in 15 cc water 120 tablet 11  . triamterene-hydrochlorothiazide (MAXZIDE-25) 37.5-25 MG tablet TAKE 2 TABLETS BY MOUTH DAILY 180 tablet 0  . Vitamin D, Ergocalciferol, (DRISDOL) 1.25 MG (50000 UT) CAPS capsule TAKE ONE CAPSULE BY MOUTH ONCE WEEKLY 12 capsule 0  . zolpidem (AMBIEN) 5 MG tablet TAKE 1-2 TABLETS BY MOUTH AT BEDTIME AS NEEDED 90 tablet 1  . doxycycline (VIBRAMYCIN) 100 MG capsule Take 1 capsule (100 mg total) by mouth 2 (two) times daily. 14 capsule 0  . esomeprazole (NEXIUM) 40 MG capsule Take 1 capsule (40 mg total) by mouth daily before breakfast. 90 capsule 3  . neomycin-polymyxin-hydrocortisone (CORTISPORIN) OTIC solution Apply 1-2 drops to toe after soaking twice a day (Patient not taking: Reported on 07/14/2018) 10 mL 0  . varenicline (CHANTIX STARTING MONTH PAK) 0.5 MG X 11 & 1 MG X 42 tablet Take 0.5 mg tablet once daily for 3 days, then increase to 0.5 mg tablet twice daily for 4 days, then increase to 1 mg tablet twice daily. 53 tablet 0  . varenicline (CHANTIX) 1 MG tablet Take 1 tablet (1 mg total) by mouth 2 (two) times daily. 60 tablet 3   No facility-administered medications prior to visit.     PAST  MEDICAL HISTORY: Past Medical History:  Diagnosis Date  . Adenocarcinoma [facility])    unknown primary probaby ovarian 2009 chemo  . Adjustment disorder   . Allergic rhinitis   . Alopecia   . Anxiety and depression   . 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  HLD (hyperlipidemia)   . Hx pulmonary embolism   . Hypertension   . Iron deficiency anemia due to chronic blood loss 11/04/2016  . Metrorrhagia   . Neuropathy   . Obesity   . Ovarian cancer ([place]) 04/2008   ChemoTherapy  . 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 Raynaud's syndrome   . Right groin mass   . Squamous cell carcinoma of skin   . Vision abnormalities   . Vitamin D deficiency     PAST SURGICAL HISTORY: Past Surgical History:  Procedure Laterality Date  . ABDOMINAL HYSTERECTOMY  05/2008   TAH/BSO  . AUGMENTATION MAMMAPLASTY Bilateral 10+ years  . BREAST REDUCTION SURGERY  2000  . CESAREAN SECTION     1991  . COLONOSCOPY  2004; 12/07/10   hemorrhoids  . ESOPHAGOGASTRODUODENOSCOPY    . GASTRIC BYPASS  1979  . TOENAIL EXCISION Bilateral   . TONSILLECTOMY      FAMILY HISTORY: Family History  Problem Relation Age of Onset  . Pulmonary embolism Mother   . Hypertension Father   . Breast cancer Maternal Grandmother        diagnosed in early 89s  . Hyperlipidemia Other   . Breast cancer Sister 71    SOCIAL HISTORY: Social History   Socioeconomic History  . Marital status: Divorced    Spouse name: Not on file  . Number of children: 1  . Years of education: Master's  . Highest education level: Not on file  Occupational History    Employer: UNEMPLOYED    Comment: disabled  Social Needs  . Financial resource strain: Not on file  . Food insecurity    Worry: Not on file    Inability: Not on file  . Transportation needs    Medical: Not on file    Non-medical: Not on file  Tobacco Use  . Smoking status: Current Every Day Smoker    Packs/day: 1.00    Years: 37.00    Pack years: 37.00    Types:  Cigarettes    Start date: 06/16/1978  . Smokeless tobacco: Never Used  . Tobacco comment: 2- 7-16  STILL SMOKING  Substance and Sexual Activity  . Alcohol use: Yes    Alcohol/week: 0.0 standard drinks    Comment: rare, 3 weekly  . Drug use: No  . Sexual activity: Not on file  Lifestyle  . Physical activity    Days per week: Not on file    Minutes per session: Not on file  . Stress: Not on file  Relationships  . Social Herbalist on phone: Not on file    Gets together: Not on file    Attends religious service: Not on file    Active member of club or organization: Not on file    Attends meetings of clubs or organizations: Not on file    Relationship status: Not on file  . Intimate partner violence    Fear of current or ex partner: Not on file    Emotionally abused: Not on file    Physically abused: Not on file    Forced sexual activity: Not on file  Other Topics Concern  . Not on file  Social History Narrative   Divorced   1 son /1 adopted adult son  2 dogs    Sales occupation on disability for now   Regular exercise-yes money stress   Daily caffeine use 2-3 and 2 cups of coffee   On since a security disability  since 2012   Patient is right-handed.   Patient has a Master's degree.      PHYSICAL EXAM  Vitals:   11/25/18 1521  BP: (!) 132/92  Pulse: 90  Temp: (!) 97.3 F (36.3 C)  Weight: 220 lb (99.8 kg)  Height: 5' 8"  (1.727 m)   Body mass index is 33.45 kg/m.  Generalized: Well developed, in no acute distress, but distressed , tearful. She   Montreal Cognitive Assessment  11/25/2018 05/26/2018 05/22/2017 06/05/2016 11/28/2015  Visuospatial/ Executive (0/5) 3 4 3 4 4   Naming (0/3) 3 3 3 3 3   Attention: Read list of digits (0/2) 0 1 0 1 1  Attention: Read list of letters (0/1) 1 1 1 1 1   Attention: Serial 7 subtraction starting at 100 (0/3) 3 3 3 3 3   Language: Repeat phrase (0/2) 1 2 2 2 2   Language : Fluency (0/1) 1 1 1 1 1   Abstraction (0/2) 2 2 2  2 2   Delayed Recall (0/5) 3 3 3 2 3   Orientation (0/6) 5 5 5 6 5   Total 22 25 23 25 25   Adjusted Score (based on education) - - - 25 25    Neurological examination  Mentation: Alert oriented to time, place, history taking. Follows all commands speech and language fluent.  Cranial nerve : No change in taste ,but complete loss of smell .ANOSMIA. for 5 years now.  Pupils were equal round reactive to light. Extraocular movements were full, visual field were full on confrontational test . Facial sensation and strength were normal. Uvula tongue midline.  Bilateral tongue protrusion is strong.   Motor: normal strength in 4 extremities, except foot dorsiflexion.  symmetric motor tone is noted throughout.  Sensory:  Progressed, profound numbness in feet and calf. Arising from the toes through the forefoot to the heel. Pain arising from the Forefoot.  Hand pain and Raynauds to the wrist,  Coordination:  Gait and station: Gait is normal. Tandem gait is abnormal. She needs to control her steps  Optically, needs to see where she steps due to lack of sensation.   Romberg is negative !  Reflexes: Deep tendon reflexes are symmetrically attanuated  bilaterally.    DIAGNOSTIC DATA (LABS, IMAGING, TESTING) - I reviewed patient records, labs, notes, testing and imaging myself where available.  NCV and EMG see chart.   MRI back see [facility]   Lab Results  Component Value Date   WBC 5.7 04/15/2018   HGB 13.7 04/15/2018   HCT 43.5 04/15/2018   MCV 90.4 04/15/2018   PLT 232 04/15/2018    Lab Results  Component Value Date   CHOL 169 06/16/2018   HDL 43.90 06/16/2018   LDLCALC 94 06/16/2018   LDLDIRECT 98.7 09/04/2009   TRIG 153.0 (H) 06/16/2018   CHOLHDL 4 06/16/2018    ASSESSMENT AND PLAN 58 y.o. year old afro american divorced and later widowed right handed female patient , here for q 6 month memory testing and refills.   0)  Ovarian Cancer diagnosed in 2009 ! She is a survivor- and  made a remarkable recovery. By January 2020 she was happy and well, and  today she is tearful , lonely, tearful.     1. 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  I have refilled ambien- I do not think it's worth weaning off, given her longstanding use of sleep aids.   She is depressed, clinically, tearful- needs cognitive therapy and a listener, rather than a prescriber.  2.  Cognitive changes in patient with ovarian cancer and long term chemotherapy - MOCA now again 22- 25 / 25-30 from 22-30 in January 2017.   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   4) NP also with Dysautonomia, syncopal events, Has fallen in her house. She is aware of her surroundings, stunned but not confused, not postictal. Had a fall in 2016, in 2017, and 3 in 2018 - but none in 2020. NCV did not show anything 4 years ago.     5. Due to her ongoing cognitive problems I would not recommend the use of Topiramate-  and with her history of ovarian cancer I am afraid of burdening her liver metabolism. MOCA 22 30.  Comprehensive metabolic panel allows  migraine prevention with a beta blocker and continue on Lyrica. Her heart rate is high, not slow.   She will continue taking Ambien for insomnia.I will refill this today for 90 days and 1 refill. She is using 10 mg nightly . She is severely depressed- and started  smoking again.   Patient's neuropathy is progressive and dysautonomia is part of this PNP. She already had accu-puncture and cupping and dry needling.  She continues on Lyrica, now  200 mg bid and Duloxetine.- prescribed by Shanon Ace, MD   I will refer for neuro-psychlogical testing and for counseling     Rv in January with NP  for disability papers.      Larey Seat, MD   11/25/2018, 3:29 PM [facility] [address], [place], [place] [address] [phone]

## 2018-11-25 NOTE — Addendum Note (Signed)
Addended by: Larey Seat on: 11/25/2018 04:30 PM   Modules accepted: Orders

## 2018-11-30 ENCOUNTER — Other Ambulatory Visit: Payer: Self-pay | Admitting: Internal Medicine

## 2018-12-24 ENCOUNTER — Other Ambulatory Visit: Payer: Self-pay | Admitting: Internal Medicine

## 2018-12-24 ENCOUNTER — Other Ambulatory Visit: Payer: Self-pay | Admitting: Family

## 2018-12-29 ENCOUNTER — Other Ambulatory Visit: Payer: Self-pay | Admitting: Family

## 2018-12-31 ENCOUNTER — Other Ambulatory Visit: Payer: Self-pay | Admitting: Hematology & Oncology

## 2018-12-31 DIAGNOSIS — C801 Malignant (primary) neoplasm, unspecified: Secondary | ICD-10-CM

## 2018-12-31 DIAGNOSIS — F4323 Adjustment disorder with mixed anxiety and depressed mood: Secondary | ICD-10-CM

## 2019-01-08 ENCOUNTER — Inpatient Hospital Stay: Payer: Medicare Other | Attending: Hematology & Oncology

## 2019-01-08 ENCOUNTER — Other Ambulatory Visit: Payer: Self-pay

## 2019-01-08 DIAGNOSIS — Z8589 Personal history of malignant neoplasm of other organs and systems: Secondary | ICD-10-CM | POA: Diagnosis not present

## 2019-01-08 DIAGNOSIS — D500 Iron deficiency anemia secondary to blood loss (chronic): Secondary | ICD-10-CM

## 2019-01-08 DIAGNOSIS — Z95828 Presence of other vascular implants and grafts: Secondary | ICD-10-CM

## 2019-01-08 DIAGNOSIS — Z452 Encounter for adjustment and management of vascular access device: Secondary | ICD-10-CM | POA: Insufficient documentation

## 2019-01-08 DIAGNOSIS — C801 Malignant (primary) neoplasm, unspecified: Secondary | ICD-10-CM

## 2019-01-08 DIAGNOSIS — G63 Polyneuropathy in diseases classified elsewhere: Secondary | ICD-10-CM

## 2019-01-08 MED ORDER — HEPARIN SOD (PORK) LOCK FLUSH 100 UNIT/ML IV SOLN
500.0000 [IU] | Freq: Once | INTRAVENOUS | Status: AC | PRN
  Administered 2019-01-08: 13:00:00 500 [IU]
  Filled 2019-01-08: qty 5

## 2019-01-08 MED ORDER — SODIUM CHLORIDE 0.9% FLUSH
10.0000 mL | INTRAVENOUS | Status: DC | PRN
  Administered 2019-01-08: 13:00:00 10 mL
  Filled 2019-01-08: qty 10

## 2019-01-08 NOTE — Patient Instructions (Signed)

## 2019-01-09 ENCOUNTER — Other Ambulatory Visit: Payer: Self-pay | Admitting: Hematology & Oncology

## 2019-01-09 DIAGNOSIS — F4323 Adjustment disorder with mixed anxiety and depressed mood: Secondary | ICD-10-CM

## 2019-01-11 ENCOUNTER — Other Ambulatory Visit: Payer: Self-pay | Admitting: *Deleted

## 2019-01-11 DIAGNOSIS — F4323 Adjustment disorder with mixed anxiety and depressed mood: Secondary | ICD-10-CM

## 2019-01-11 MED ORDER — PREGABALIN 200 MG PO CAPS: 200.0000 mg | ORAL_CAPSULE | Freq: Two times a day (BID) | ORAL | 1 refills | Status: DC

## 2019-01-22 ENCOUNTER — Encounter: Payer: Self-pay | Admitting: Internal Medicine

## 2019-01-22 ENCOUNTER — Ambulatory Visit: Payer: Self-pay

## 2019-01-22 ENCOUNTER — Telehealth (INDEPENDENT_AMBULATORY_CARE_PROVIDER_SITE_OTHER): Payer: Medicare Other | Admitting: Internal Medicine

## 2019-01-22 ENCOUNTER — Other Ambulatory Visit: Payer: Self-pay

## 2019-01-22 VITALS — BP 138/90

## 2019-01-22 DIAGNOSIS — F17200 Nicotine dependence, unspecified, uncomplicated: Secondary | ICD-10-CM | POA: Diagnosis not present

## 2019-01-22 DIAGNOSIS — Z79899 Other long term (current) drug therapy: Secondary | ICD-10-CM | POA: Diagnosis not present

## 2019-01-22 DIAGNOSIS — T451X5A Adverse effect of antineoplastic and immunosuppressive drugs, initial encounter: Secondary | ICD-10-CM

## 2019-01-22 DIAGNOSIS — G620 Drug-induced polyneuropathy: Secondary | ICD-10-CM | POA: Diagnosis not present

## 2019-01-22 DIAGNOSIS — I10 Essential (primary) hypertension: Secondary | ICD-10-CM | POA: Diagnosis not present

## 2019-01-22 DIAGNOSIS — R42 Dizziness and giddiness: Secondary | ICD-10-CM | POA: Diagnosis not present

## 2019-01-22 NOTE — Telephone Encounter (Signed)
Pt. Reports she is in [place] working with the [employer] and yesterday did not feel well. Has a EMS worker check her BP .It was 154/100. This morning it is 164/132 - no symptoms- "I feel fine." No missed doses of medication. Warm transfer to [facility] in the practice for a visit.   Answer Assessment - Initial Assessment Questions 1. BLOOD PRESSURE: "What is the blood pressure?" "Did you take at least two measurements 5 minutes apart?"     164/132 2. ONSET: "When did you take your blood pressure?"     This morning 3. HOW: "How did you obtain the blood pressure?" (e.g., visiting nurse, automatic home BP monitor)     EMS 4. HISTORY: "Do you have a history of high blood pressure?"     Yes 5. MEDICATIONS: "Are you taking any medications for blood pressure?" "Have you missed any doses recently?"     No 6. OTHER SYMPTOMS: "Do you have any symptoms?" (e.g., headache, chest pain, blurred vision, difficulty breathing, weakness)     No symptoms today 7. PREGNANCY: "Is there any chance you are pregnant?" "When was your last menstrual period?"     No  Protocols used: HIGH BLOOD PRESSURE-A-AH

## 2019-01-22 NOTE — Progress Notes (Signed)
Virtual Visit via Video Note  I connected with@ on 01/22/19 at  3:00 PM EDT by a video enabled telemedicine application and verified that I am speaking with the correct person using two identifiers. Location patient: home Location provider:work office Persons participating in the virtual visit: patient, provider  [place] national recommendations  regarding COVID 19 pandemic   video visit is advised over in office visit for this patient.  Patient aware  of the limitations of evaluation and management by telemedicine and  availability of in person appointments. and agreed to proceed.   HPI: [name] presents for video visit for concerns about elevated blood pressure after feeling dizzy lightheaded today. She is down in [place] with the [employer] helping out working long days not getting enough sleep and it is also quite hot.  She had some dizzy lightheaded feeling EMS was called because they are next-door was noted to have blood pressure 164/130 at 1 reading 154/102 and then after a while 135/98.  She has been taking her 2 Maxide a day and her potassium half dose of Chantix as the higher dose causes a higher side effect. No severe headaches chest pain shortness of breath or bleeding.  Chose not to go to the emergency room and her blood pressure did come down somewhat. No new medicines Carafate was given but she never had to take it because things are better.     ROS: See pertinent positives and negatives per HPI.  No fever cough falling new neurologic changes.  Past Medical History:  Diagnosis Date  . 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 Adjustment disorder   . Allergic rhinitis   . Alopecia   . Anxiety and depression   . 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 HLD (hyperlipidemia)   . Hx pulmonary embolism   . Hypertension   . Iron deficiency anemia due to chronic blood loss 11/04/2016  . Metrorrhagia   . Neuropathy   . Obesity   . Ovarian  cancer ([place]) 04/2008   ChemoTherapy  . 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 Raynaud's syndrome   . Right groin mass   . Squamous cell carcinoma of skin   . Vision abnormalities   . Vitamin D deficiency     Past Surgical History:  Procedure Laterality Date  . ABDOMINAL HYSTERECTOMY  05/2008   TAH/BSO  . AUGMENTATION MAMMAPLASTY Bilateral 10+ years  . BREAST REDUCTION SURGERY  2000  . CESAREAN SECTION     1991  . COLONOSCOPY  2004; 12/07/10   hemorrhoids  . ESOPHAGOGASTRODUODENOSCOPY    . GASTRIC BYPASS  1979  . TOENAIL EXCISION Bilateral   . TONSILLECTOMY      Family History  Problem Relation Age of Onset  . Pulmonary embolism Mother   . Hypertension Father   . Breast cancer Maternal Grandmother        diagnosed in early 77s  . Hyperlipidemia Other   . Breast cancer Sister 11    Social History   Tobacco Use  . Smoking status: Current Every Day Smoker    Packs/day: 1.00    Years: 37.00    Pack years: 37.00    Types: Cigarettes    Start date: 06/16/1978  . Smokeless tobacco: Never Used  . Tobacco comment: 2- 7-16  STILL SMOKING  Substance Use Topics  . Alcohol use: Yes    Alcohol/week: 0.0 standard drinks    Comment: rare, 3 weekly  . Drug  use: No      Current Outpatient Medications:  .  aspirin 81 MG tablet, Take 81 mg by mouth daily. 2 TABS IN AM, Disp: , Rfl:  .  CHANTIX 0.5 MG tablet, TAKE 1 TABLET BY MOUTH TWICE DAILY, Disp: 60 tablet, Rfl: 3 .  donepezil (ARICEPT) 10 MG tablet, Take 1 tablet (10 mg total) by mouth every morning., Disp: 90 tablet, Rfl: 1 .  DULoxetine (CYMBALTA) 60 MG capsule, TAKE 1 CAPSULE BY MOUTH DAILY, Disp: 90 capsule, Rfl: 0 .  Multiple Vitamin (MULTIVITAMIN WITH MINERALS) TABS tablet, Take 1 tablet by mouth daily., Disp: , Rfl:  .  orphenadrine (NORFLEX) 100 MG tablet, TAKE 1 TABLET BY MOUTH TWICE DAILY AS NEEDED FOR MUSCLE SPASMS, Disp: 180 tablet, Rfl: 0 .  potassium chloride (K-DUR) 10 MEQ tablet, TAKE 2 TABLETS  BY MOUTH EVERY DAY, Disp: 180 tablet, Rfl: 1 .  pregabalin (LYRICA) 200 MG capsule, TAKE 1 CAPSULE BY MOUTH TWICE DAILY, Disp: 60 capsule, Rfl: 3 .  simvastatin (ZOCOR) 40 MG tablet, TAKE 1 TABLET BY MOUTH EVERY DAY, Disp: 90 tablet, Rfl: 0 .  triamterene-hydrochlorothiazide (MAXZIDE-25) 37.5-25 MG tablet, TAKE 2 TABLETS BY MOUTH DAILY, Disp: 180 tablet, Rfl: 0 .  Vitamin D, Ergocalciferol, (DRISDOL) 1.25 MG (50000 UT) CAPS capsule, TAKE ONE CAPSULE BY MOUTH ONCE A WEEK, Disp: 12 capsule, Rfl: 0 .  zolpidem (AMBIEN) 5 MG tablet, Take 1-2 tablets (5-10 mg total) by mouth at bedtime as needed., Disp: 30 tablet, Rfl: 1 .  butalbital-acetaminophen-caffeine (FIORICET, ESGIC) 50-325-40 MG tablet, TAKE 1 TABLET BY MOUTH EVERY 6 HOURS AS NEEDED FOR HEADACHE (Patient not taking: Reported on 01/22/2019), Disp: 10 tablet, Rfl: 3 .  calcium carbonate (TUMS - DOSED IN MG ELEMENTAL CALCIUM) 500 MG chewable tablet, Chew 1 tablet by mouth daily as needed for indigestion or heartburn., Disp: , Rfl:  .  diclofenac (VOLTAREN) 75 MG EC tablet, TK 1 T PO BID AFTER MEALS FOR INFLAMMATION / PAIN / SWELLING PRN ONLY, Disp: , Rfl: 1 .  sucralfate (CARAFATE) 1 g tablet, Take 1 tablet (1 g total) by mouth 4 (four) times daily -  with meals and at bedtime. May dissolve/crush in 15 cc water (Patient not taking: Reported on 01/22/2019), Disp: 120 tablet, Rfl: 11  EXAM: BP Readings from Last 3 Encounters:  01/22/19 138/90  11/25/18 (!) 132/92  07/14/18 118/79    VITALS per patient if applicable: Blood pressure 135/98.  Looks in no acute distress baseline cognitive but does look tired i.e. overworked.  GENERAL: alert, oriented, appears well and in no acute distress HEENT: atraumatic, conjunttiva clear, no obvious abnormalities on inspection of external nose and ears NECK: normal movements of the head and neck LUNGS: on inspection no signs of respiratory distress, breathing rate appears normal, no obvious gross SOB, gasping or  wheezing  CV: no obvious cyanosis MS: moves all visible extremities without noticeable abnormality  PSYCH/NEURO: pleasant and cooperative, no obvious depression or anxiety, speech and thought processing grossly intact Lab Results  Component Value Date   WBC 5.7 04/15/2018   HGB 13.7 04/15/2018   HCT 43.5 04/15/2018   PLT 232 04/15/2018   GLUCOSE 80 04/15/2018   CHOL 169 06/16/2018   TRIG 153.0 (H) 06/16/2018   HDL 43.90 06/16/2018   LDLDIRECT 98.7 09/04/2009   LDLCALC 94 06/16/2018   ALT 29 04/15/2018   AST 26 04/15/2018   NA 138 04/15/2018   K 3.7 04/15/2018   CL 101 04/15/2018   CREATININE  0.95 04/15/2018   BUN 20 04/15/2018   CO2 29 04/15/2018   TSH 3.99 06/16/2018   INR 0.86 06/02/2013    ASSESSMENT AND PLAN:  Discussed the following assessment and plan:    ICD-10-CM   1. Intermittent lightheadedness  R42    today see text had elevated Bp no other   2. Medication management  Z79.899   3. Essential hypertension  I10   4. TOBACCO USE  F17.200   5. Neuropathy due to chemotherapeutic drug ([place])  G62.0    T45.1X5A    Discussed her medication she is on double dose Maxide which could certainly cause side effects and would definitely not go up on this.  If we need better blood pressure control would add a different medication. It is possible that she is overtired and dehydrated but optimally would be able to check a chemistry panel BMP if possible.  To check for metabolic derangement.  Otherwise she plans on trying to get more sleep stay hydrated send in some blood pressure readings on my chart for next week and then when she is back in town make an in-person visit. Discussed alarm symptoms findings and need for other care.  If her blood pressures the 160s and above on a regular basis contact the medical team. Counseled.   Expectant management and discussion of plan and treatment with opportunity to ask questions and all were answered. The patient agreed with the plan and  demonstrated an understanding of the instructions.   Advised to call back or seek an in-person evaluation if worsening  or having  further concerns .  Return in about 2 weeks (around 02/05/2019) for when  back in town send in bp readings.    Shanon Ace, MD

## 2019-01-22 NOTE — Telephone Encounter (Signed)
Pt scheduled for virtual visit today.

## 2019-02-02 NOTE — Progress Notes (Deleted)
No chief complaint on file.   HPI: [name] 58 y.o. come in for Chronic disease management  ROS: See pertinent positives and negatives per HPI.  Past Medical History:  Diagnosis Date  . 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 Adjustment disorder   . Allergic rhinitis   . Alopecia   . Anxiety and depression   . 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 HLD (hyperlipidemia)   . Hx pulmonary embolism   . Hypertension   . Iron deficiency anemia due to chronic blood loss 11/04/2016  . Metrorrhagia   . Neuropathy   . Obesity   . Ovarian cancer ([place]) 04/2008   ChemoTherapy  . 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 Raynaud's syndrome   . Right groin mass   . Squamous cell carcinoma of skin   . Vision abnormalities   . Vitamin D deficiency     Family History  Problem Relation Age of Onset  . Pulmonary embolism Mother   . Hypertension Father   . Breast cancer Maternal Grandmother        diagnosed in early 63s  . Hyperlipidemia Other   . Breast cancer Sister 14    Social History   Socioeconomic History  . Marital status: Divorced    Spouse name: Not on file  . Number of children: 1  . Years of education: Master's  . Highest education level: Not on file  Occupational History    Employer: UNEMPLOYED    Comment: disabled  Social Needs  . Financial resource strain: Not on file  . Food insecurity    Worry: Not on file    Inability: Not on file  . Transportation needs    Medical: Not on file    Non-medical: Not on file  Tobacco Use  . Smoking status: Current Every Day Smoker    Packs/day: 1.00    Years: 37.00    Pack years: 37.00    Types: Cigarettes    Start date: 06/16/1978  . Smokeless tobacco: Never Used  . Tobacco comment: 2- 7-16  STILL SMOKING  Substance and Sexual Activity  . Alcohol use: Yes    Alcohol/week: 0.0 standard drinks    Comment: rare, 3 weekly  . Drug use: No  . Sexual activity: Not on file   Lifestyle  . Physical activity    Days per week: Not on file    Minutes per session: Not on file  . Stress: Not on file  Relationships  . Social Herbalist on phone: Not on file    Gets together: Not on file    Attends religious service: Not on file    Active member of club or organization: Not on file    Attends meetings of clubs or organizations: Not on file    Relationship status: Not on file  Other Topics Concern  . Not on file  Social History Narrative   Divorced   1 son /1 adopted adult son  2 dogs    Sales occupation on disability for now   Regular exercise-yes money stress   Daily caffeine use 2-3 and 2 cups of coffee   On since a security disability since 2012   Patient is right-handed.   Patient has a Master's degree.    Outpatient Medications Prior to Visit  Medication Sig Dispense Refill  . aspirin 81 MG tablet Take 81 mg by mouth daily. 2 TABS IN AM    .  butalbital-acetaminophen-caffeine (FIORICET, ESGIC) 50-325-40 MG tablet TAKE 1 TABLET BY MOUTH EVERY 6 HOURS AS NEEDED FOR HEADACHE (Patient not taking: Reported on 01/22/2019) 10 tablet 3  . calcium carbonate (TUMS - DOSED IN MG ELEMENTAL CALCIUM) 500 MG chewable tablet Chew 1 tablet by mouth daily as needed for indigestion or heartburn.    . CHANTIX 0.5 MG tablet TAKE 1 TABLET BY MOUTH TWICE DAILY 60 tablet 3  . diclofenac (VOLTAREN) 75 MG EC tablet TK 1 T PO BID AFTER MEALS FOR INFLAMMATION / PAIN / SWELLING PRN ONLY  1  . donepezil (ARICEPT) 10 MG tablet Take 1 tablet (10 mg total) by mouth every morning. 90 tablet 1  . DULoxetine (CYMBALTA) 60 MG capsule TAKE 1 CAPSULE BY MOUTH DAILY 90 capsule 0  . Multiple Vitamin (MULTIVITAMIN WITH MINERALS) TABS tablet Take 1 tablet by mouth daily.    . orphenadrine (NORFLEX) 100 MG tablet TAKE 1 TABLET BY MOUTH TWICE DAILY AS NEEDED FOR MUSCLE SPASMS 180 tablet 0  . potassium chloride (K-DUR) 10 MEQ tablet TAKE 2 TABLETS BY MOUTH EVERY DAY 180 tablet 1  .  pregabalin (LYRICA) 200 MG capsule TAKE 1 CAPSULE BY MOUTH TWICE DAILY 60 capsule 3  . simvastatin (ZOCOR) 40 MG tablet TAKE 1 TABLET BY MOUTH EVERY DAY 90 tablet 0  . sucralfate (CARAFATE) 1 g tablet Take 1 tablet (1 g total) by mouth 4 (four) times daily -  with meals and at bedtime. May dissolve/crush in 15 cc water (Patient not taking: Reported on 01/22/2019) 120 tablet 11  . triamterene-hydrochlorothiazide (MAXZIDE-25) 37.5-25 MG tablet TAKE 2 TABLETS BY MOUTH DAILY 180 tablet 0  . Vitamin D, Ergocalciferol, (DRISDOL) 1.25 MG (50000 UT) CAPS capsule TAKE ONE CAPSULE BY MOUTH ONCE A WEEK 12 capsule 0  . zolpidem (AMBIEN) 5 MG tablet Take 1-2 tablets (5-10 mg total) by mouth at bedtime as needed. 30 tablet 1   No facility-administered medications prior to visit.      EXAM:  There were no vitals taken for this visit.  There is no height or weight on file to calculate BMI.  GENERAL: vitals reviewed and listed above, alert, oriented, appears well hydrated and in no acute distress HEENT: atraumatic, conjunctiva  clear, no obvious abnormalities on inspection of external nose and ears OP : no lesion edema or exudate  NECK: no obvious masses on inspection palpation  LUNGS: clear to auscultation bilaterally, no wheezes, rales or rhonchi, good air movement CV: HRRR, no clubbing cyanosis or  peripheral edema nl cap refill  MS: moves all extremities without noticeable focal  abnormality PSYCH: pleasant and cooperative, no obvious depression or anxiety Lab Results  Component Value Date   WBC 5.7 04/15/2018   HGB 13.7 04/15/2018   HCT 43.5 04/15/2018   PLT 232 04/15/2018   GLUCOSE 80 04/15/2018   CHOL 169 06/16/2018   TRIG 153.0 (H) 06/16/2018   HDL 43.90 06/16/2018   LDLDIRECT 98.7 09/04/2009   LDLCALC 94 06/16/2018   ALT 29 04/15/2018   AST 26 04/15/2018   NA 138 04/15/2018   K 3.7 04/15/2018   CL 101 04/15/2018   CREATININE 0.95 04/15/2018   BUN 20 04/15/2018   CO2 29 04/15/2018    TSH 3.99 06/16/2018   INR 0.86 06/02/2013   BP Readings from Last 3 Encounters:  01/22/19 138/90  11/25/18 (!) 132/92  07/14/18 118/79   Wt Readings from Last 3 Encounters:  11/25/18 220 lb (99.8 kg)  07/14/18 224 lb (101.6 kg)    06/17/18 224 lb (101.6 kg)    ASSESSMENT AND PLAN:  Discussed the following assessment and plan:  Medication management  Intermittent lightheadedness  Essential hypertension  Neuropathy due to chemotherapeutic drug ([facility])  -Patient advised to return or notify health care team  if  new concerns arise.  There are no Patient Instructions on file for this visit.   Wanda K. Panosh M.D. 

## 2019-02-03 ENCOUNTER — Ambulatory Visit: Payer: Medicare Other | Admitting: Internal Medicine

## 2019-02-05 NOTE — Progress Notes (Signed)
Chief Complaint  Patient presents with  . Follow-up    pt states that she has been experiencing lightheadedness and that her blood pressure has been 140/100 ranges    HPI: [name] 58 y.o. come in for fu of  Sx transeint orthostatic light headedness and  bp elevation but now  Back down and gets to 130 range   See last notes   And has worked on weight loss since getting home   Had very "bad good" eating in [place]    10 #  Weight loss from gain in [place].  On Keto diet.   Now and feeling better stil has  Transient ortho dizziness lasting  More seconds     bp   Since home  140/ 100 range   To 130 range   Hit r little toe avout 2 weeks ago  Throbbing today  Thinks may have broken it .  Wearing sandals that more comfortable than shoes .   ROS: See pertinent positives and negatives per HPI. No current cp sob cough  neuropathy feet may be slightly worse   Past Medical History:  Diagnosis Date  . 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 Adjustment disorder   . Allergic rhinitis   . Alopecia   . Anxiety and depression   . 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 HLD (hyperlipidemia)   . Hx pulmonary embolism   . Hypertension   . Iron deficiency anemia due to chronic blood loss 11/04/2016  . Metrorrhagia   . Neuropathy   . Obesity   . Ovarian cancer ([place]) 04/2008   ChemoTherapy  . 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 Raynaud's syndrome   . Right groin mass   . Squamous cell carcinoma of skin   . Vision abnormalities   . Vitamin D deficiency     Family History  Problem Relation Age of Onset  . Pulmonary embolism Mother   . Hypertension Father   . Breast cancer Maternal Grandmother        diagnosed in early 38s  . Hyperlipidemia Other   . Breast cancer Sister 50    Social History   Socioeconomic History  . Marital status: Divorced    Spouse name: Not on file  . Number of children: 1  . Years of education: Master's  . Highest education  level: Not on file  Occupational History    Employer: UNEMPLOYED    Comment: disabled  Social Needs  . Financial resource strain: Not on file  . Food insecurity    Worry: Not on file    Inability: Not on file  . Transportation needs    Medical: Not on file    Non-medical: Not on file  Tobacco Use  . Smoking status: Current Every Day Smoker    Packs/day: 1.00    Years: 37.00    Pack years: 37.00    Types: Cigarettes    Start date: 06/16/1978  . Smokeless tobacco: Never Used  . Tobacco comment: 2- 7-16  STILL SMOKING  Substance and Sexual Activity  . Alcohol use: Yes    Alcohol/week: 0.0 standard drinks    Comment: rare, 3 weekly  . Drug use: No  . Sexual activity: Not on file  Lifestyle  . Physical activity    Days per week: Not on file    Minutes per session: Not on file  . Stress: Not on file  Relationships  . Social connections  Talks on phone: Not on file    Gets together: Not on file    Attends religious service: Not on file    Active member of club or organization: Not on file    Attends meetings of clubs or organizations: Not on file    Relationship status: Not on file  Other Topics Concern  . Not on file  Social History Narrative   Divorced   1 son /1 adopted adult son  2 dogs    Sales occupation on disability for now   Regular exercise-yes money stress   Daily caffeine use 2-3 and 2 cups of coffee   On since a security disability since 2012   Patient is right-handed.   Patient has a Master's degree.    Outpatient Medications Prior to Visit  Medication Sig Dispense Refill  . aspirin 81 MG tablet Take 81 mg by mouth daily. 2 TABS IN AM    . butalbital-acetaminophen-caffeine (FIORICET, ESGIC) 50-325-40 MG tablet TAKE 1 TABLET BY MOUTH EVERY 6 HOURS AS NEEDED FOR HEADACHE 10 tablet 3  . calcium carbonate (TUMS - DOSED IN MG ELEMENTAL CALCIUM) 500 MG chewable tablet Chew 1 tablet by mouth daily as needed for indigestion or heartburn.    . CHANTIX 0.5 MG  tablet TAKE 1 TABLET BY MOUTH TWICE DAILY 60 tablet 3  . diclofenac (VOLTAREN) 75 MG EC tablet TK 1 T PO BID AFTER MEALS FOR INFLAMMATION / PAIN / SWELLING PRN ONLY  1  . donepezil (ARICEPT) 10 MG tablet Take 1 tablet (10 mg total) by mouth every morning. 90 tablet 1  . DULoxetine (CYMBALTA) 60 MG capsule TAKE 1 CAPSULE BY MOUTH DAILY 90 capsule 0  . Multiple Vitamin (MULTIVITAMIN WITH MINERALS) TABS tablet Take 1 tablet by mouth daily.    . orphenadrine (NORFLEX) 100 MG tablet TAKE 1 TABLET BY MOUTH TWICE DAILY AS NEEDED FOR MUSCLE SPASMS 180 tablet 0  . potassium chloride (K-DUR) 10 MEQ tablet TAKE 2 TABLETS BY MOUTH EVERY DAY 180 tablet 1  . pregabalin (LYRICA) 200 MG capsule TAKE 1 CAPSULE BY MOUTH TWICE DAILY 60 capsule 3  . simvastatin (ZOCOR) 40 MG tablet TAKE 1 TABLET BY MOUTH EVERY DAY 90 tablet 0  . sucralfate (CARAFATE) 1 g tablet Take 1 tablet (1 g total) by mouth 4 (four) times daily -  with meals and at bedtime. May dissolve/crush in 15 cc water 120 tablet 11  . triamterene-hydrochlorothiazide (MAXZIDE-25) 37.5-25 MG tablet TAKE 2 TABLETS BY MOUTH DAILY 180 tablet 0  . Vitamin D, Ergocalciferol, (DRISDOL) 1.25 MG (50000 UT) CAPS capsule TAKE ONE CAPSULE BY MOUTH ONCE A WEEK 12 capsule 0  . zolpidem (AMBIEN) 5 MG tablet Take 1-2 tablets (5-10 mg total) by mouth at bedtime as needed. 30 tablet 1   No facility-administered medications prior to visit.      EXAM:  BP 134/82 (BP Location: Right Arm, Patient Position: Sitting, Cuff Size: Large)   Pulse 96   Temp (!) 97.5 F (36.4 C) (Temporal)   Wt 214 lb 3.2 oz (97.2 kg)   SpO2 97%   BMI 32.57 kg/m   Body mass index is 32.57 kg/m.  GENERAL: vitals reviewed and listed above, alert, oriented, appears well hydrated and in no acute distress HEENT: atraumatic, conjunctiva  clear, no obvious abnormalities on inspection of external nose and ears OP : masked  NECK: no obvious masses on inspection palpation  No bruit heard  LUNGS:  clear to auscultation bilaterally, no wheezes, rales  or rhonchi, good air movement CV: HRRR, no clubbing cyanosis or  peripheral edema nl cap refill  Abdomen:  Sof,t normal bowel sounds without hepatosplenomegaly, no guarding rebound or masses no CVA tenderness  MS: moves all extremities without noticeable focal  Abnormality right little toe swollen mildly no warmth or abrasion  tinu dark speck in middle of toe  All in line  PSYCH: pleasant and cooperative, no obvious depression or anxiety Lab Results  Component Value Date   WBC 5.7 04/15/2018   HGB 13.7 04/15/2018   HCT 43.5 04/15/2018   PLT 232 04/15/2018   GLUCOSE 80 04/15/2018   CHOL 169 06/16/2018   TRIG 153.0 (H) 06/16/2018   HDL 43.90 06/16/2018   LDLDIRECT 98.7 09/04/2009   LDLCALC 94 06/16/2018   ALT 29 04/15/2018   AST 26 04/15/2018   NA 138 04/15/2018   K 3.7 04/15/2018   CL 101 04/15/2018   CREATININE 0.95 04/15/2018   BUN 20 04/15/2018   CO2 29 04/15/2018   TSH 3.99 06/16/2018   INR 0.86 06/02/2013   BP Readings from Last 3 Encounters:  02/08/19 134/82  01/22/19 138/90  11/25/18 (!) 132/92   Wt Readings from Last 3 Encounters:  02/08/19 214 lb 3.2 oz (97.2 kg)  11/25/18 220 lb (99.8 kg)  07/14/18 224 lb (101.6 kg)     ASSESSMENT AND PLAN:  Discussed the following assessment and plan:  Intermittent lightheadedness - Plan: Basic metabolic panel, CBC with Differential/Platelet, Hemoglobin A1c, Hepatic function panel, Lipid panel, Magnesium, T4, free, TSH, CANCELED: Basic metabolic panel, CANCELED: CBC with Differential/Platelet, CANCELED: Hemoglobin A1c, CANCELED: Hepatic function panel, CANCELED: Lipid panel, CANCELED: TSH, CANCELED: T4, free, CANCELED: Magnesium  Medication management - Plan: Basic metabolic panel, CBC with Differential/Platelet, Hemoglobin A1c, Hepatic function panel, Lipid panel, Magnesium, T4, free, TSH, CANCELED: Basic metabolic panel, CANCELED: CBC with Differential/Platelet, CANCELED:  Hemoglobin A1c, CANCELED: Hepatic function panel, CANCELED: Lipid panel, CANCELED: TSH, CANCELED: T4, free, CANCELED: Magnesium  Essential hypertension - Plan: Basic metabolic panel, CBC with Differential/Platelet, Hemoglobin A1c, Hepatic function panel, Lipid panel, Magnesium, T4, free, TSH, CANCELED: Basic metabolic panel, CANCELED: CBC with Differential/Platelet, CANCELED: Hemoglobin A1c, CANCELED: Hepatic function panel, CANCELED: Lipid panel, CANCELED: TSH, CANCELED: T4, free, CANCELED: Magnesium  Need for immunization against influenza - Plan: Flu Vaccine QUAD 36+ mos IM  Injury of toe on right foot, initial encounter - Plan: Basic metabolic panel, CBC with Differential/Platelet, Hemoglobin A1c, Hepatic function panel, Lipid panel, Magnesium, T4, free, TSH, CANCELED: Basic metabolic panel, CANCELED: CBC with Differential/Platelet, CANCELED: Hemoglobin A1c, CANCELED: Hepatic function panel, CANCELED: Lipid panel, CANCELED: TSH, CANCELED: T4, free, CANCELED: Magnesium  -Patient advised to return or notify health care team  if  new concerns arise.  Patient Instructions  Make fasting lab appt  .   Below  140/90 and  Best 120/80  Will notify you  of labs when available. And then decide to add med if needed   If the toe not improving we can do an x ray but  Usually  No other rx except protection and splinting .  But pain can last 4-6 weeks .       Standley Brooking. Victory Dresden M.D.

## 2019-02-08 ENCOUNTER — Other Ambulatory Visit: Payer: Self-pay

## 2019-02-08 ENCOUNTER — Encounter: Payer: Self-pay | Admitting: Internal Medicine

## 2019-02-08 ENCOUNTER — Ambulatory Visit (INDEPENDENT_AMBULATORY_CARE_PROVIDER_SITE_OTHER): Payer: Medicare Other | Admitting: Internal Medicine

## 2019-02-08 VITALS — BP 134/82 | HR 96 | Temp 97.5°F | Wt 214.2 lb

## 2019-02-08 DIAGNOSIS — S99921A Unspecified injury of right foot, initial encounter: Secondary | ICD-10-CM | POA: Diagnosis not present

## 2019-02-08 DIAGNOSIS — I10 Essential (primary) hypertension: Secondary | ICD-10-CM | POA: Diagnosis not present

## 2019-02-08 DIAGNOSIS — R42 Dizziness and giddiness: Secondary | ICD-10-CM | POA: Diagnosis not present

## 2019-02-08 DIAGNOSIS — Z23 Encounter for immunization: Secondary | ICD-10-CM | POA: Diagnosis not present

## 2019-02-08 DIAGNOSIS — Z79899 Other long term (current) drug therapy: Secondary | ICD-10-CM | POA: Diagnosis not present

## 2019-02-08 NOTE — Patient Instructions (Signed)
Make fasting lab appt  .   Below  140/90 and  Best 120/80  Will notify you  of labs when available. And then decide to add med if needed   If the toe not improving we can do an x ray but  Usually  No other rx except protection and splinting .  But pain can last 4-6 weeks .

## 2019-02-09 ENCOUNTER — Other Ambulatory Visit (INDEPENDENT_AMBULATORY_CARE_PROVIDER_SITE_OTHER): Payer: Medicare Other

## 2019-02-09 ENCOUNTER — Other Ambulatory Visit: Payer: Self-pay

## 2019-02-09 DIAGNOSIS — Z20822 Contact with and (suspected) exposure to covid-19: Secondary | ICD-10-CM

## 2019-02-09 DIAGNOSIS — R42 Dizziness and giddiness: Secondary | ICD-10-CM | POA: Diagnosis not present

## 2019-02-09 DIAGNOSIS — I10 Essential (primary) hypertension: Secondary | ICD-10-CM | POA: Diagnosis not present

## 2019-02-09 DIAGNOSIS — Z79899 Other long term (current) drug therapy: Secondary | ICD-10-CM | POA: Diagnosis not present

## 2019-02-09 DIAGNOSIS — R6889 Other general symptoms and signs: Secondary | ICD-10-CM | POA: Diagnosis not present

## 2019-02-09 DIAGNOSIS — S99921A Unspecified injury of right foot, initial encounter: Secondary | ICD-10-CM

## 2019-02-09 LAB — BASIC METABOLIC PANEL
BUN: 33 mg/dL — ABNORMAL HIGH (ref 6–23)
CO2: 26 mEq/L (ref 19–32)
Calcium: 10.2 mg/dL (ref 8.4–10.5)
Chloride: 99 mEq/L (ref 96–112)
Creatinine, Ser: 1 mg/dL (ref 0.40–1.20)
GFR: 68.79 mL/min (ref >60.00)
Glucose, Bld: 87 mg/dL (ref 70–99)
Potassium: 3.2 mEq/L — ABNORMAL LOW (ref 3.5–5.1)
Sodium: 138 mEq/L (ref 135–145)

## 2019-02-09 LAB — HEPATIC FUNCTION PANEL
ALT: 21 U/L (ref 0–35)
AST: 23 U/L (ref 0–37)
Albumin: 4.3 g/dL (ref 3.5–5.2)
Alkaline Phosphatase: 80 U/L (ref 39–117)
Bilirubin, Direct: 0.1 mg/dL (ref 0.0–0.3)
Total Bilirubin: 0.7 mg/dL (ref 0.2–1.2)
Total Protein: 6.8 g/dL (ref 6.0–8.3)

## 2019-02-09 LAB — CBC WITH DIFFERENTIAL/PLATELET
Basophils Absolute: 0 10*3/uL (ref 0.0–0.1)
Basophils Relative: 0.5 % (ref 0.0–3.0)
Eosinophils Absolute: 0.1 10*3/uL (ref 0.0–0.7)
Eosinophils Relative: 1.5 % (ref 0.0–5.0)
HCT: 42.7 % (ref 36.0–46.0)
Hemoglobin: 14.1 g/dL (ref 12.0–15.0)
Lymphocytes Relative: 16.3 % (ref 12.0–46.0)
Lymphs Abs: 0.9 10*3/uL (ref 0.7–4.0)
MCHC: 33.1 g/dL (ref 30.0–36.0)
MCV: 88 fl (ref 78.0–100.0)
Monocytes Absolute: 0.5 10*3/uL (ref 0.1–1.0)
Monocytes Relative: 8.4 % (ref 3.0–12.0)
Neutro Abs: 4 10*3/uL (ref 1.4–7.7)
Neutrophils Relative %: 73.3 % (ref 43.0–77.0)
Platelets: 201 10*3/uL (ref 150.0–400.0)
RBC: 4.86 Mil/uL (ref 3.87–5.11)
RDW: 14.7 % (ref 11.5–15.5)
WBC: 5.5 10*3/uL (ref 4.0–10.5)

## 2019-02-09 LAB — LIPID PANEL
Cholesterol: 185 mg/dL (ref 0–200)
HDL: 45.5 mg/dL (ref >39.00)
LDL Cholesterol: 101 mg/dL — ABNORMAL HIGH (ref 0–99)
NonHDL: 139.15
Total CHOL/HDL Ratio: 4
Triglycerides: 189 mg/dL — ABNORMAL HIGH (ref 0.0–149.0)
VLDL: 37.8 mg/dL (ref 0.0–40.0)

## 2019-02-09 LAB — TSH: TSH: 6.42 u[IU]/mL — ABNORMAL HIGH (ref 0.35–4.50)

## 2019-02-09 LAB — T4, FREE: Free T4: 0.87 ng/dL (ref 0.60–1.60)

## 2019-02-09 LAB — HEMOGLOBIN A1C: Hgb A1c MFr Bld: 6.2 % (ref 4.6–6.5)

## 2019-02-09 LAB — MAGNESIUM: Magnesium: 1.9 mg/dL (ref 1.5–2.5)

## 2019-02-10 LAB — NOVEL CORONAVIRUS, NAA: SARS-CoV-2, NAA: NOT DETECTED

## 2019-02-15 ENCOUNTER — Other Ambulatory Visit: Payer: Self-pay

## 2019-02-15 ENCOUNTER — Other Ambulatory Visit: Payer: Self-pay | Admitting: Internal Medicine

## 2019-02-15 DIAGNOSIS — R7989 Other specified abnormal findings of blood chemistry: Secondary | ICD-10-CM

## 2019-02-15 DIAGNOSIS — E876 Hypokalemia: Secondary | ICD-10-CM

## 2019-02-15 MED ORDER — POTASSIUM CHLORIDE CRYS ER 10 MEQ PO TBCR: 30.0000 meq | EXTENDED_RELEASE_TABLET | Freq: Three times a day (TID) | ORAL | 1 refills | Status: DC

## 2019-02-20 ENCOUNTER — Other Ambulatory Visit: Payer: Self-pay | Admitting: Internal Medicine

## 2019-02-23 NOTE — Telephone Encounter (Signed)
Get the other thyroid  labs appt  first and then we can add medication  It is not too late  for the last twinrix   Can get any time

## 2019-02-25 ENCOUNTER — Inpatient Hospital Stay: Payer: Medicare Other | Attending: Hematology & Oncology | Admitting: Family

## 2019-02-25 ENCOUNTER — Inpatient Hospital Stay: Payer: Medicare Other

## 2019-02-25 ENCOUNTER — Encounter: Payer: Self-pay | Admitting: Family

## 2019-02-25 ENCOUNTER — Other Ambulatory Visit: Payer: Self-pay

## 2019-02-25 VITALS — BP 106/68 | HR 79 | Temp 97.8°F | Resp 18

## 2019-02-25 DIAGNOSIS — Z8589 Personal history of malignant neoplasm of other organs and systems: Secondary | ICD-10-CM | POA: Diagnosis not present

## 2019-02-25 DIAGNOSIS — E611 Iron deficiency: Secondary | ICD-10-CM | POA: Insufficient documentation

## 2019-02-25 DIAGNOSIS — M25559 Pain in unspecified hip: Secondary | ICD-10-CM

## 2019-02-25 DIAGNOSIS — C801 Malignant (primary) neoplasm, unspecified: Secondary | ICD-10-CM

## 2019-02-25 DIAGNOSIS — D500 Iron deficiency anemia secondary to blood loss (chronic): Secondary | ICD-10-CM

## 2019-02-25 DIAGNOSIS — K5900 Constipation, unspecified: Secondary | ICD-10-CM | POA: Insufficient documentation

## 2019-02-25 DIAGNOSIS — Z95828 Presence of other vascular implants and grafts: Secondary | ICD-10-CM

## 2019-02-25 DIAGNOSIS — M25551 Pain in right hip: Secondary | ICD-10-CM | POA: Diagnosis not present

## 2019-02-25 DIAGNOSIS — G629 Polyneuropathy, unspecified: Secondary | ICD-10-CM | POA: Diagnosis not present

## 2019-02-25 DIAGNOSIS — Z8543 Personal history of malignant neoplasm of ovary: Secondary | ICD-10-CM

## 2019-02-25 DIAGNOSIS — M25552 Pain in left hip: Secondary | ICD-10-CM | POA: Diagnosis not present

## 2019-02-25 LAB — CMP (CANCER CENTER ONLY)
ALT: 50 U/L — ABNORMAL HIGH (ref 0–44)
AST: 34 U/L (ref 15–41)
Albumin: 4.1 g/dL (ref 3.5–5.0)
Alkaline Phosphatase: 73 U/L (ref 38–126)
Anion gap: 10 (ref 5–15)
BUN: 38 mg/dL — ABNORMAL HIGH (ref 6–20)
CO2: 27 mmol/L (ref 22–32)
Calcium: 10 mg/dL (ref 8.9–10.3)
Chloride: 101 mmol/L (ref 98–111)
Creatinine: 1.09 mg/dL — ABNORMAL HIGH (ref 0.44–1.00)
GFR, Est AFR Am: >60 mL/min (ref >60)
GFR, Estimated: 56 mL/min — ABNORMAL LOW (ref >60)
Glucose, Bld: 90 mg/dL (ref 70–99)
Potassium: 3.6 mmol/L (ref 3.5–5.1)
Sodium: 138 mmol/L (ref 135–145)
Total Bilirubin: 0.4 mg/dL (ref 0.3–1.2)
Total Protein: 6.5 g/dL (ref 6.5–8.1)

## 2019-02-25 LAB — CBC WITH DIFFERENTIAL (CANCER CENTER ONLY)
Abs Immature Granulocytes: 0.02 10*3/uL (ref 0.00–0.07)
Basophils Absolute: 0 10*3/uL (ref 0.0–0.1)
Basophils Relative: 0 %
Eosinophils Absolute: 0.1 10*3/uL (ref 0.0–0.5)
Eosinophils Relative: 1 %
HCT: 41 % (ref 36.0–46.0)
Hemoglobin: 13.6 g/dL (ref 12.0–15.0)
Immature Granulocytes: 0 %
Lymphocytes Relative: 22 %
Lymphs Abs: 1.4 10*3/uL (ref 0.7–4.0)
MCH: 28.9 pg (ref 26.0–34.0)
MCHC: 33.2 g/dL (ref 30.0–36.0)
MCV: 87 fL (ref 80.0–100.0)
Monocytes Absolute: 0.4 10*3/uL (ref 0.1–1.0)
Monocytes Relative: 6 %
Neutro Abs: 4.7 10*3/uL (ref 1.7–7.7)
Neutrophils Relative %: 71 %
Platelet Count: 298 10*3/uL (ref 150–400)
RBC: 4.71 MIL/uL (ref 3.87–5.11)
RDW: 15.7 % — ABNORMAL HIGH (ref 11.5–15.5)
WBC Count: 6.7 10*3/uL (ref 4.0–10.5)
nRBC: 0 % (ref 0.0–0.2)

## 2019-02-25 MED ORDER — HEPARIN SOD (PORK) LOCK FLUSH 100 UNIT/ML IV SOLN
500.0000 [IU] | Freq: Once | INTRAVENOUS | Status: AC
  Administered 2019-02-25: 500 [IU] via INTRAVENOUS
  Filled 2019-02-25: qty 5

## 2019-02-25 MED ORDER — SODIUM CHLORIDE 0.9% FLUSH
10.0000 mL | INTRAVENOUS | Status: DC | PRN
  Administered 2019-02-25: 10 mL via INTRAVENOUS
  Filled 2019-02-25: qty 10

## 2019-02-25 NOTE — Progress Notes (Signed)
Hematology and Oncology Follow Up Visit  [name] [id number] August 29, 1960 58 y.o. 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 She states that she is having bilateral hip pain that worsens when she lays down. She has also noted new constipation.  She has had injections in the back and hips in the past which seems to help and will be going again soon.  No swelling in her extremities.  The neuropathy in her feet is described as severe but not changed from her "normal". She will sometimes stumble and fall due to miss stepping. She is followed by her neurologist every 6 months and will see them again in January.  She states that her PCP is monitoring both her potassium level and TSH.  She has maintained a good appetite and is staying well hydrated.  She had one episode 2 weeks ago of what sounds like possible GERD that resolved after she vomited once. She has had no other issues. Her weight is stable.   ECOG Performance Status: 1 - Symptomatic but completely ambulatory  Medications:  Allergies as of 02/25/2019      Reactions   Codeine Nausea Only   Orange Itching, Rash      Medication List       Accurate as of February 25, 2019  3:36 PM. If you have any questions, ask your nurse or doctor.        aspirin 81 MG tablet Take 81 mg by mouth daily. 2 TABS IN AM   butalbital-acetaminophen-caffeine 50-325-40 MG tablet Commonly known as: FIORICET TAKE 1 TABLET BY MOUTH EVERY 6 HOURS AS NEEDED FOR HEADACHE   calcium carbonate 500 MG chewable tablet Commonly known as: TUMS - dosed in mg elemental calcium Chew 1 tablet by mouth daily as needed for indigestion or heartburn.   Chantix 0.5 MG tablet Generic drug: varenicline TAKE 1 TABLET BY MOUTH  TWICE DAILY   diclofenac 75 MG EC tablet Commonly known as: VOLTAREN TK 1 T PO BID AFTER MEALS FOR INFLAMMATION / PAIN / SWELLING PRN ONLY   donepezil 10 MG tablet Commonly known as: ARICEPT Take 1 tablet (10 mg total) by mouth every morning.   DULoxetine 60 MG capsule Commonly known as: CYMBALTA TAKE 1 CAPSULE BY MOUTH DAILY   multivitamin with minerals Tabs tablet Take 1 tablet by mouth daily.   orphenadrine 100 MG tablet Commonly known as: NORFLEX TAKE ONE TABLET BY MOUTH TWICE A DAY AS NEEDED FOR MUSCLE SPASM   potassium chloride 10 MEQ tablet Commonly known as: KLOR-CON Take 3 tablets (30 mEq total) by mouth 3 (three) times daily.   pregabalin 200 MG capsule Commonly known as: LYRICA TAKE 1 CAPSULE BY MOUTH TWICE DAILY   simvastatin 40 MG tablet Commonly known as: ZOCOR TAKE 1 TABLET BY MOUTH DAILY   sucralfate 1 g tablet Commonly known as: CARAFATE Take 1 tablet (1 g total) by mouth 4 (four) times daily -  with meals and at bedtime. May dissolve/crush in 15 cc water   triamterene-hydrochlorothiazide 37.5-25 MG tablet Commonly known as: MAXZIDE-25 TAKE 2 TABLETS BY MOUTH DAILY   Vitamin D (Ergocalciferol) 1.25 MG (50000 UT) Caps capsule Commonly known as: DRISDOL TAKE ONE CAPSULE BY MOUTH ONCE A WEEK   zolpidem 5 MG tablet Commonly known as: AMBIEN Take 1-2 tablets (5-10 mg total) by  mouth at bedtime as needed.       Allergies:  Allergies  Allergen Reactions  . Codeine Nausea Only  . Orange Itching and Rash    Past Medical History, Surgical history, Social history, and Family History were reviewed and updated.  Review of Systems: All other 10 point review of systems is negative.   Physical Exam:  vitals were not taken for this visit.   Wt Readings from Last 3 Encounters:  02/08/19 214 lb 3.2 oz (97.2 kg)  11/25/18 220 lb (99.8 kg)  07/14/18 224 lb (101.6 kg)    Ocular: Sclerae unicteric, pupils equal, round and reactive to  light Ear-nose-throat: Oropharynx clear, dentition fair Lymphatic: No cervical or supraclavicular adenopathy Lungs no rales or rhonchi, good excursion bilaterally Heart regular rate and rhythm, no murmur appreciated Abd soft, nontender, positive bowel sounds, no liver or spleen tip palpated on exam, no fluid wave  MSK bilateral hip and lower back discomfort, no joint edema Neuro: non-focal, well-oriented, appropriate affect Breasts: Deferred   Lab Results  Component Value Date   WBC 6.7 02/25/2019   HGB 13.6 02/25/2019   HCT 41.0 02/25/2019   MCV 87.0 02/25/2019   PLT 298 02/25/2019   Lab Results  Component Value Date   FERRITIN 76 04/15/2018   IRON 73 04/15/2018   TIBC 308 04/15/2018   UIBC 234 04/15/2018   IRONPCTSAT 24 04/15/2018   Lab Results  Component Value Date   RBC 4.71 02/25/2019   No results found for: KPAFRELGTCHN, LAMBDASER, KAPLAMBRATIO No results found for: Kandis Cocking, IGMSERUM No results found for: Odetta Pink, SPEI   Chemistry      Component Value Date/Time   NA 138 02/09/2019 1013   NA 147 (H) 04/16/2017 1152   NA 139 12/27/2015 1156   K 3.2 (L) 02/09/2019 1013   K 3.4 04/16/2017 1152   K 3.9 12/27/2015 1156   CL 99 02/09/2019 1013   CL 101 04/16/2017 1152   CO2 26 02/09/2019 1013   CO2 30 04/16/2017 1152   CO2 27 12/27/2015 1156   BUN 33 (H) 02/09/2019 1013   BUN 18 04/16/2017 1152   BUN 14.9 12/27/2015 1156   CREATININE 1.00 02/09/2019 1013   CREATININE 0.95 04/15/2018 1200   CREATININE 1.2 04/16/2017 1152   CREATININE 0.9 12/27/2015 1156      Component Value Date/Time   CALCIUM 10.2 02/09/2019 1013   CALCIUM 9.8 04/16/2017 1152   CALCIUM 9.7 12/27/2015 1156   ALKPHOS 80 02/09/2019 1013   ALKPHOS 87 (H) 04/16/2017 1152   ALKPHOS 106 12/27/2015 1156   AST 23 02/09/2019 1013   AST 26 04/15/2018 1200   AST 29 12/27/2015 1156   ALT 21 02/09/2019 1013   ALT 29 04/15/2018 1200    ALT 26 04/16/2017 1152   ALT 28 12/27/2015 1156   BILITOT 0.7 02/09/2019 1013   BILITOT 0.4 04/15/2018 1200   BILITOT 0.33 12/27/2015 1156       Impression and Plan: [name] is very pleasant 58 yo African American female with a remote history of a poorly differentiated carcinoma which was felt to be ovarian, BRCA +. She completed 6 cycles of Carboplatin/Taxotere almost 10 years ago.  We will get a CT of the abdomen and pelvis to evaluate for cause of hip pain. If this is negative she will follow-up with neurology.  We will go ahead and plan to see her back in another year per Dr. Marin Olp.  She will contact our office with any questions or concerns. We can certainly see her sooner if needed.   Laverna Peace, NP 10/15/20203:36 PM

## 2019-02-25 NOTE — Patient Instructions (Signed)
Implanted Port Insertion, Care After This sheet gives you information about how to care for yourself after your procedure. Your health care provider may also give you more specific instructions. If you have problems or questions, contact your health care provider. What can I expect after the procedure? After the procedure, it is common to have:  Discomfort at the port insertion site.  Bruising on the skin over the port. This should improve over 3-4 days. Follow these instructions at home: [facility] care  After your port is placed, you will get a manufacturer's information card. The card has information about your port. Keep this card with you at all times.  Take care of the port as told by your health care provider. Ask your health care provider if you or a family member can get training for taking care of the port at home. A home health care nurse may also take care of the port.  Make sure to remember what type of port you have. Incision care      Follow instructions from your health care provider about how to take care of your port insertion site. Make sure you: ? Wash your hands with soap and water before and after you change your bandage (dressing). If soap and water are not available, use hand sanitizer. ? Change your dressing as told by your health care provider. ? Leave stitches (sutures), skin glue, or adhesive strips in place. These skin closures may need to stay in place for 2 weeks or longer. If adhesive strip edges start to loosen and curl up, you may trim the loose edges. Do not remove adhesive strips completely unless your health care provider tells you to do that.  Check your port insertion site every day for signs of infection. Check for: ? Redness, swelling, or pain. ? Fluid or blood. ? Warmth. ? Pus or a bad smell. Activity  Return to your normal activities as told by your health care provider. Ask your health care provider what activities are safe for you.  Do not  lift anything that is heavier than 10 lb (4.5 kg), or the limit that you are told, until your health care provider says that it is safe. General instructions  Take over-the-counter and prescription medicines only as told by your health care provider.  Do not take baths, swim, or use a hot tub until your health care provider approves. Ask your health care provider if you may take showers. You may only be allowed to take sponge baths.  Do not drive for 24 hours if you were given a sedative during your procedure.  Wear a medical alert bracelet in case of an emergency. This will tell any health care providers that you have a port.  Keep all follow-up visits as told by your health care provider. This is important. Contact a health care provider if:  You cannot flush your port with saline as directed, or you cannot draw blood from the port.  You have a fever or chills.  You have redness, swelling, or pain around your port insertion site.  You have fluid or blood coming from your port insertion site.  Your port insertion site feels warm to the touch.  You have pus or a bad smell coming from the port insertion site. Get help right away if:  You have chest pain or shortness of breath.  You have bleeding from your port that you cannot control. Summary  Take care of the port as told by your health  care provider. Keep the manufacturer's information card with you at all times.  Change your dressing as told by your health care provider.  Contact a health care provider if you have a fever or chills or if you have redness, swelling, or pain around your port insertion site.  Keep all follow-up visits as told by your health care provider. This information is not intended to replace advice given to you by your health care provider. Make sure you discuss any questions you have with your health care provider. Document Released: 02/17/2013 Document Revised: 11/25/2017 Document Reviewed: 11/25/2017  Elsevier Patient Education  [place].

## 2019-02-26 LAB — IRON AND TIBC
Iron: 64 ug/dL (ref 41–142)
Saturation Ratios: 19 % — ABNORMAL LOW (ref 21–57)
TIBC: 339 ug/dL (ref 236–444)
UIBC: 275 ug/dL (ref 120–384)

## 2019-02-26 LAB — FERRITIN: Ferritin: 27 ng/mL (ref 11–307)

## 2019-03-01 ENCOUNTER — Other Ambulatory Visit: Payer: Self-pay | Admitting: Family

## 2019-03-01 ENCOUNTER — Telehealth: Payer: Self-pay | Admitting: Family

## 2019-03-01 NOTE — Telephone Encounter (Signed)
I called [name] per her request and went over her lab work. She had some questions about her iron studies as well as her BUN and Creatinine.  She is on a daily diuretic and will make sure to stay well hydrated.  She is minimally iron deficient and will be getting a dose of IV iron.  I will forward her lab work to her PCP Dr. Mamie Nick with [facility].

## 2019-03-01 NOTE — Telephone Encounter (Signed)
Called and spoke with patient regarding appointment added per 10/15 sch msg

## 2019-03-02 ENCOUNTER — Ambulatory Visit: Payer: Medicare Other

## 2019-03-04 ENCOUNTER — Inpatient Hospital Stay: Payer: Medicare Other

## 2019-03-04 ENCOUNTER — Other Ambulatory Visit: Payer: Self-pay

## 2019-03-04 VITALS — BP 118/82 | HR 68 | Temp 97.3°F | Resp 18

## 2019-03-04 DIAGNOSIS — C801 Malignant (primary) neoplasm, unspecified: Secondary | ICD-10-CM

## 2019-03-04 DIAGNOSIS — G629 Polyneuropathy, unspecified: Secondary | ICD-10-CM | POA: Diagnosis not present

## 2019-03-04 DIAGNOSIS — M25551 Pain in right hip: Secondary | ICD-10-CM | POA: Diagnosis not present

## 2019-03-04 DIAGNOSIS — K5900 Constipation, unspecified: Secondary | ICD-10-CM | POA: Diagnosis not present

## 2019-03-04 DIAGNOSIS — Z95828 Presence of other vascular implants and grafts: Secondary | ICD-10-CM

## 2019-03-04 DIAGNOSIS — Z8589 Personal history of malignant neoplasm of other organs and systems: Secondary | ICD-10-CM | POA: Diagnosis not present

## 2019-03-04 DIAGNOSIS — D500 Iron deficiency anemia secondary to blood loss (chronic): Secondary | ICD-10-CM

## 2019-03-04 DIAGNOSIS — M25552 Pain in left hip: Secondary | ICD-10-CM | POA: Diagnosis not present

## 2019-03-04 DIAGNOSIS — E611 Iron deficiency: Secondary | ICD-10-CM | POA: Diagnosis not present

## 2019-03-04 MED ORDER — SODIUM CHLORIDE 0.9% FLUSH
10.0000 mL | Freq: Once | INTRAVENOUS | Status: AC | PRN
  Administered 2019-03-04: 12:00:00 10 mL
  Filled 2019-03-04: qty 10

## 2019-03-04 MED ORDER — SODIUM CHLORIDE 0.9 % IV SOLN
200.0000 mg | Freq: Once | INTRAVENOUS | Status: AC
  Administered 2019-03-04: 200 mg via INTRAVENOUS
  Filled 2019-03-04: qty 10

## 2019-03-04 MED ORDER — HEPARIN SOD (PORK) LOCK FLUSH 100 UNIT/ML IV SOLN
500.0000 [IU] | Freq: Once | INTRAVENOUS | Status: AC | PRN
  Administered 2019-03-04: 500 [IU]
  Filled 2019-03-04: qty 5

## 2019-03-04 MED ORDER — SODIUM CHLORIDE 0.9 % IV SOLN: 510.0000 mg | Freq: Once | INTRAVENOUS | Status: DC

## 2019-03-04 MED ORDER — SODIUM CHLORIDE 0.9 % IV SOLN
Freq: Once | INTRAVENOUS | Status: AC
  Administered 2019-03-04: 11:00:00 via INTRAVENOUS
  Filled 2019-03-04: qty 250

## 2019-03-04 NOTE — Patient Instructions (Addendum)

## 2019-03-07 ENCOUNTER — Encounter: Payer: Self-pay | Admitting: Family

## 2019-03-09 ENCOUNTER — Ambulatory Visit (HOSPITAL_BASED_OUTPATIENT_CLINIC_OR_DEPARTMENT_OTHER)
Admission: RE | Admit: 2019-03-09 | Discharge: 2019-03-09 | Disposition: A | Discharge status: Home / Self Care | Payer: Medicare Other | Source: Ambulatory Visit | Attending: Family | Admitting: Family

## 2019-03-09 ENCOUNTER — Other Ambulatory Visit: Payer: Self-pay

## 2019-03-09 ENCOUNTER — Inpatient Hospital Stay: Payer: Medicare Other

## 2019-03-09 VITALS — BP 108/75 | HR 77 | Temp 97.1°F | Resp 18

## 2019-03-09 DIAGNOSIS — M25559 Pain in unspecified hip: Secondary | ICD-10-CM | POA: Diagnosis not present

## 2019-03-09 DIAGNOSIS — E611 Iron deficiency: Secondary | ICD-10-CM | POA: Diagnosis not present

## 2019-03-09 DIAGNOSIS — Z8543 Personal history of malignant neoplasm of ovary: Secondary | ICD-10-CM | POA: Insufficient documentation

## 2019-03-09 DIAGNOSIS — M25551 Pain in right hip: Secondary | ICD-10-CM | POA: Diagnosis not present

## 2019-03-09 DIAGNOSIS — K5900 Constipation, unspecified: Secondary | ICD-10-CM | POA: Insufficient documentation

## 2019-03-09 DIAGNOSIS — Z8589 Personal history of malignant neoplasm of other organs and systems: Secondary | ICD-10-CM | POA: Diagnosis not present

## 2019-03-09 DIAGNOSIS — G629 Polyneuropathy, unspecified: Secondary | ICD-10-CM | POA: Diagnosis not present

## 2019-03-09 DIAGNOSIS — K449 Diaphragmatic hernia without obstruction or gangrene: Secondary | ICD-10-CM | POA: Diagnosis not present

## 2019-03-09 DIAGNOSIS — Z95828 Presence of other vascular implants and grafts: Secondary | ICD-10-CM

## 2019-03-09 DIAGNOSIS — M25552 Pain in left hip: Secondary | ICD-10-CM | POA: Diagnosis not present

## 2019-03-09 MED ORDER — HEPARIN SOD (PORK) LOCK FLUSH 100 UNIT/ML IV SOLN
500.0000 [IU] | Freq: Once | INTRAVENOUS | Status: AC
  Administered 2019-03-09: 500 [IU] via INTRAVENOUS
  Filled 2019-03-09: qty 5

## 2019-03-09 MED ORDER — SODIUM CHLORIDE 0.9% FLUSH
10.0000 mL | INTRAVENOUS | Status: DC | PRN
  Administered 2019-03-09: 10 mL via INTRAVENOUS
  Filled 2019-03-09: qty 10

## 2019-03-09 MED ORDER — IOHEXOL 300 MG/ML  SOLN
100.0000 mL | Freq: Once | INTRAMUSCULAR | Status: AC | PRN
  Administered 2019-03-09: 100 mL via INTRAVENOUS

## 2019-03-10 ENCOUNTER — Telehealth: Payer: Self-pay | Admitting: *Deleted

## 2019-03-10 NOTE — Telephone Encounter (Addendum)
-----   Message from Eliezer Bottom, NP sent at 03/10/2019  2:45 PM EDT ----- Called patient to let her know there is No evidence of recurrent or metastatic disease! WOO HOO!!! Scan did show degenerative disc disease and a bulging disc at L4-5. Patient has an orthopedist and requested we send results to Dr. Jacelyn Grip   ----- Message ----- From: Interface, Rad Results In Sent: 03/09/2019  12:24 PM EDT To: Eliezer Bottom, NP

## 2019-03-17 ENCOUNTER — Other Ambulatory Visit: Payer: Self-pay | Admitting: Internal Medicine

## 2019-03-17 ENCOUNTER — Other Ambulatory Visit: Payer: Self-pay | Admitting: Family

## 2019-03-17 ENCOUNTER — Other Ambulatory Visit: Payer: Self-pay | Admitting: Neurology

## 2019-03-17 DIAGNOSIS — H8113 Benign paroxysmal vertigo, bilateral: Secondary | ICD-10-CM

## 2019-03-17 DIAGNOSIS — G4701 Insomnia due to medical condition: Secondary | ICD-10-CM

## 2019-03-17 DIAGNOSIS — H53143 Visual discomfort, bilateral: Secondary | ICD-10-CM

## 2019-03-17 DIAGNOSIS — G43109 Migraine with aura, not intractable, without status migrainosus: Secondary | ICD-10-CM

## 2019-03-23 ENCOUNTER — Other Ambulatory Visit: Payer: Self-pay | Admitting: Family

## 2019-03-29 ENCOUNTER — Other Ambulatory Visit: Payer: Self-pay

## 2019-03-29 ENCOUNTER — Encounter: Payer: Medicare Other | Attending: Psychology | Admitting: Psychology

## 2019-03-29 DIAGNOSIS — G63 Polyneuropathy in diseases classified elsewhere: Secondary | ICD-10-CM | POA: Diagnosis not present

## 2019-03-29 DIAGNOSIS — F331 Major depressive disorder, recurrent, moderate: Secondary | ICD-10-CM | POA: Diagnosis present

## 2019-03-29 DIAGNOSIS — C801 Malignant (primary) neoplasm, unspecified: Secondary | ICD-10-CM | POA: Insufficient documentation

## 2019-03-29 DIAGNOSIS — R29818 Other symptoms and signs involving the nervous system: Secondary | ICD-10-CM | POA: Diagnosis not present

## 2019-03-29 DIAGNOSIS — G620 Drug-induced polyneuropathy: Secondary | ICD-10-CM | POA: Diagnosis not present

## 2019-03-29 DIAGNOSIS — R4189 Other symptoms and signs involving cognitive functions and awareness: Secondary | ICD-10-CM | POA: Insufficient documentation

## 2019-03-29 DIAGNOSIS — T451X5A Adverse effect of antineoplastic and immunosuppressive drugs, initial encounter: Secondary | ICD-10-CM | POA: Diagnosis not present

## 2019-03-30 ENCOUNTER — Other Ambulatory Visit: Payer: Self-pay

## 2019-03-30 DIAGNOSIS — Z20822 Contact with and (suspected) exposure to covid-19: Secondary | ICD-10-CM

## 2019-04-01 LAB — NOVEL CORONAVIRUS, NAA: SARS-CoV-2, NAA: NOT DETECTED

## 2019-04-01 NOTE — Progress Notes (Signed)
Neuropsychological Consultation   Patient:   [name]   DOB:   02/27/1961  MR Number:  [id number]  Location:  [facility] [id number] [place] [address] Dept: [phone]           Date of Service:   03/29/2019  Start Time:   2 PM End Time:   4 PM  Today's visit was an in person visit that was conducted in my outpatient clinic office.  The patient and myself were present for this clinical interview.  1 hour was spent in person face-to-face.  A second hour was utilized with review of available medical records and report writing.  Provider/Observer:   , Psy.D.       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  The patient has had residual cognitive changes similar to possible impacts of chemotherapy as well as significant residual pain symptoms.  The patient was diagnosed and treated for cancer 10 years ago and continues to have ongoing follow-up regarding possible recurrence.  The patient has had neuropathy, insomnia and memory loss post chemotherapy.  The patient has depressive symptoms that have developed and has a hard time motivating and a hard time keeping focused on life and staying engaged.  Reason for Service:  [name] is a 58-year-old female referred by Carmen Dohmeier, MD for neuropsychological consultation due to ongoing coping and adjustment issues and cognitive changes following diagnosis and treatment for cancer which was felt to be a primary ovarian cancer that had spread to  lymph nodes.  The patient has had residual cognitive changes similar to possible impacts of chemotherapy as well as significant residual pain symptoms.  The patient was diagnosed and treated for cancer 10 years ago and continues to have ongoing follow-up regarding possible recurrence.  The patient has had neuropathy, insomnia and memory loss post chemotherapy.  The patient has depressive symptoms that have developed and has a hard time motivating and a hard time keeping focused on life and staying engaged.  The patient has a history of neuropathy, cognitive changes and insomnia and is continued to be followed up.  The patient describes her cancer history is initially finding cancer in her lymph systems and the cancer was removed surgically and then she was given chemotherapy.  Her oncologist feels that the primary cancer was ovarian in nature but they have not been able to find the original cancer in her ovaries.  After being diagnosed with cancer the patient went through essentially anything that she could find that may help her with her cancer care and treatment including surgery, chemotherapy as well as identifying a psychic healer in [place] and working on a wide range of dietary interventions.  The patient has been cancer free for many years now but does continue to have a great anxiety and worry about its reoccurrence.  The patient is taking Lexapro for her depressive symptoms but continues to have difficulty with motivation and frustration.  The patient describes ongoing cognitive difficulties primarily around memory issues that may be related to her 8 months of chemotherapy   that was needed to treat her for her very significant cancer.  Current Status:  The patient describes depressive symptoms, including motivation, feelings of helplessness and hopelessness and anxiety about recurrence of her cancer.  The patient also describes ongoing chronic pain symptoms and neuropathy as well as insomnia and memory  loss.  Reliability of Information: The information is derived from 1 hour face-to-face clinical interview as well as review of available medical records.  Behavioral Observation: [name]  presents as a 58 y.o.-year-old Right  Female who appeared her stated age. her dress was Appropriate and she was Well Groomed and her manners were Appropriate to the situation.  her participation was indicative of Appropriate and Attentive behaviors.  There were any physical disabilities noted.  she displayed an appropriate level of cooperation and motivation.     Interactions:    Active Appropriate and Attentive  Attention:   within normal limits and attention span and concentration were age appropriate  Memory:   abnormal; remote memory intact, recent memory impaired  Visuo-spatial:  not examined  Speech (Volume):  normal  Speech:   normal; normal  Thought Process:  Coherent and Relevant  Though Content:  WNL; not suicidal and not homicidal  Orientation:   person, place, time/date and situation  Judgment:   Good  Planning:   Good  Affect:    Appropriate  Mood:    Dysphoric  Insight:   Good  Intelligence:   high  Marital Status/Living: The patient was born in [place] and raised along with 8 siblings.  There were no significant childhood illnesses or childhood issues.  The patient currently lives by herself and is lives alone for the past 11 years.  The patient is divorced.  She has been married 2 times previous with the first marriage lasting for 5 years and the second marriage lasted for 3 years.  The patient has one 29-year-old child.  Current Employment: The patient is retired.  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  Substance Use:  The patient reports only occasional use of alcohol but does acknowledge tobacco use daily.  Education:   The patient achieved her masters  in business administration with a 3.75 GPA.  The patient attended both [employer] as well as [employer].  Medical History:   Past Medical History:  Diagnosis Date  . 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 Adjustment disorder   . Allergic rhinitis   . Alopecia   . Anxiety and depression   . 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 HLD (hyperlipidemia)   . Hx pulmonary embolism   . Hypertension   . Iron deficiency anemia due to chronic blood loss 11/04/2016  . Metrorrhagia   . Neuropathy   . Obesity   . Ovarian cancer (HCC) 04/2008   ChemoTherapy  . 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 Raynaud's syndrome   . Right groin mass   . Squamous cell carcinoma of skin   . Vision abnormalities   . Vitamin D deficiency     Psychiatric History:  The patient does have a history of developing anxiety and depressive symptoms post cancer diagnosis and chemotherapy.  The patient has underlying anxiety about a reoccurrence of her cancer itself.  The patient has a deep spiritual view about everything that is transpired and some of the variables that have helped her get   through her cancer treatment and care.  Family Med/Psych History:  Family History  Problem Relation Age of Onset  . Pulmonary embolism Mother   . Hypertension Father   . Breast cancer Maternal Grandmother        diagnosed in early 30s  . Hyperlipidemia Other   . Breast cancer Sister 42    Risk of Suicide/Violence: virtually non-existent the patient denies any suicidal homicidal ideation.  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  The patient has had residual cognitive changes similar to possible impacts  of chemotherapy as well as significant residual pain symptoms.  The patient was diagnosed and treated for cancer 10 years ago and continues to have ongoing follow-up regarding possible recurrence.  The patient has had neuropathy, insomnia and memory loss post chemotherapy.  The patient has depressive symptoms that have developed and has a hard time motivating and a hard time keeping focused on life and staying engaged.  The patient describes depressive symptoms, including motivation, feelings of helplessness and hopelessness and anxiety about recurrence of her cancer.  The patient also describes ongoing chronic pain symptoms and neuropathy as well as insomnia and memory loss.  Disposition/Plan:  We have scheduled the patient for psychotherapeutic interventions.  She has been scheduled for 4 visits initially 2 weeks apart.  There is some delay between our visit today and how soon I will be able to see her for therapeutic interventions.  We have already begun establishing some goals that she should work on between now and her first visit.  We will also laced the patient on the waiting list to try to move up her next appointment as soon as possible but we cannot determine when that will be at this point but will make every effort to see her as soon as possible.  Diagnosis:    Neuropathy associated with cancer (HCC)  Neuropathy due to chemotherapeutic drug (HCC)  Neurocognitive deficits  Major depressive disorder, recurrent episode, moderate (HCC)         Electronically Signed   _______________________  , Psy.D.        

## 2019-04-20 ENCOUNTER — Other Ambulatory Visit: Payer: Self-pay | Admitting: Internal Medicine

## 2019-04-22 ENCOUNTER — Inpatient Hospital Stay: Payer: Medicare Other | Attending: Hematology & Oncology

## 2019-04-22 ENCOUNTER — Other Ambulatory Visit: Payer: Self-pay

## 2019-04-22 DIAGNOSIS — Z95828 Presence of other vascular implants and grafts: Secondary | ICD-10-CM

## 2019-04-22 DIAGNOSIS — Z452 Encounter for adjustment and management of vascular access device: Secondary | ICD-10-CM | POA: Insufficient documentation

## 2019-04-22 DIAGNOSIS — Z8589 Personal history of malignant neoplasm of other organs and systems: Secondary | ICD-10-CM | POA: Insufficient documentation

## 2019-04-22 DIAGNOSIS — D500 Iron deficiency anemia secondary to blood loss (chronic): Secondary | ICD-10-CM

## 2019-04-22 DIAGNOSIS — C801 Malignant (primary) neoplasm, unspecified: Secondary | ICD-10-CM

## 2019-04-22 MED ORDER — SODIUM CHLORIDE 0.9% FLUSH
10.0000 mL | INTRAVENOUS | Status: DC | PRN
  Administered 2019-04-22: 10 mL
  Filled 2019-04-22: qty 10

## 2019-04-22 MED ORDER — HEPARIN SOD (PORK) LOCK FLUSH 100 UNIT/ML IV SOLN
500.0000 [IU] | Freq: Once | INTRAVENOUS | Status: AC | PRN
  Administered 2019-04-22: 11:00:00 500 [IU]
  Filled 2019-04-22: qty 5

## 2019-04-22 NOTE — Patient Instructions (Signed)

## 2019-05-12 ENCOUNTER — Encounter: Payer: Self-pay | Admitting: Family

## 2019-05-28 ENCOUNTER — Other Ambulatory Visit: Payer: Self-pay | Admitting: Hematology & Oncology

## 2019-05-28 DIAGNOSIS — F4323 Adjustment disorder with mixed anxiety and depressed mood: Secondary | ICD-10-CM

## 2019-05-31 ENCOUNTER — Other Ambulatory Visit: Payer: Self-pay | Admitting: Neurology

## 2019-05-31 MED ORDER — ZOLPIDEM TARTRATE 5 MG PO TABS: 5.0000 mg | ORAL_TABLET | Freq: Every evening | ORAL | 1 refills | Status: DC | PRN

## 2019-06-12 ENCOUNTER — Other Ambulatory Visit: Payer: Self-pay | Admitting: Family

## 2019-06-14 ENCOUNTER — Other Ambulatory Visit: Payer: Self-pay | Admitting: Family

## 2019-06-14 ENCOUNTER — Telehealth: Payer: Medicare Other | Admitting: Family

## 2019-06-14 DIAGNOSIS — J029 Acute pharyngitis, unspecified: Secondary | ICD-10-CM | POA: Diagnosis not present

## 2019-06-14 DIAGNOSIS — R067 Sneezing: Secondary | ICD-10-CM | POA: Diagnosis not present

## 2019-06-14 NOTE — Progress Notes (Signed)
E visit for Allergic Rhinitis We are sorry that you are not feeling well.  Here is how we plan to help!  Based on what you have shared with me it looks like you have Allergic Rhinitis.  Rhinitis is when a reaction occurs that causes nasal congestion, runny nose, sneezing, and itching.  Most types of rhinitis are caused by an inflammation and are associated with symptoms in the eyes ears or throat. There are several types of rhinitis.  The most common are acute rhinitis, which is usually caused by a viral illness, allergic or seasonal rhinitis, and nonallergic or year-round rhinitis.  Nasal allergies occur certain times of the year.  Allergic rhinitis is caused when allergens in the air trigger the release of histamine in the body.  Histamine causes itching, swelling, and fluid to build up in the fragile linings of the nasal passages, sinuses and eyelids.  An itchy nose and clear discharge are common.  I recommend the following over the counter treatments: You should take a daily dose of antihistamine and Xyzal 5 mg take 1 tablet daily.   I also would recommend a nasal spray: Flonase 2 sprays into each nostril once daily.   You will have to see your PCP to get a referral to see ENT. I recommend making a follow up appt in the next few days.     HOME CARE:   You can use an over-the-counter saline nasal spray as needed  Avoid areas where there is heavy dust, mites, or molds  Stay indoors on windy days during the pollen season  Keep windows closed in home, at least in bedroom; use air conditioner.  Use high-efficiency house air filter  Keep windows closed in car, turn AC on re-circulate  Avoid playing out with dog during pollen season  GET HELP RIGHT AWAY IF:   If your symptoms do not improve within 10 days  You become short of breath  You develop yellow or green discharge from your nose for over 3 days  You have coughing fits  MAKE SURE YOU:   Understand these  instructions  Will watch your condition  Will get help right away if you are not doing well or get worse  Thank you for choosing an e-visit. Your e-visit answers were reviewed by a board certified advanced clinical practitioner to complete your personal care plan. Depending upon the condition, your plan could have included both over the counter or prescription medications. Please review your pharmacy choice. Be sure that the pharmacy you have chosen is open so that you can pick up your prescription now.  If there is a problem you may message your provider in [place] to have the prescription routed to another pharmacy. Your safety is important to [place]. If you have drug allergies check your prescription carefully.  For the next 24 hours, you can use MyChart to ask questions about today's visit, request a non-urgent call back, or ask for a work or school excuse from your e-visit provider. You will get an email in the next two days asking about your experience. I hope that your e-visit has been valuable and will speed your recovery.    Approximately 5 minutes was spent documenting and reviewing patient's chart.

## 2019-06-15 ENCOUNTER — Other Ambulatory Visit: Payer: Self-pay | Admitting: Internal Medicine

## 2019-06-15 ENCOUNTER — Other Ambulatory Visit: Payer: Self-pay | Admitting: Family

## 2019-06-16 ENCOUNTER — Other Ambulatory Visit: Payer: Self-pay | Admitting: Family

## 2019-06-16 DIAGNOSIS — F4323 Adjustment disorder with mixed anxiety and depressed mood: Secondary | ICD-10-CM

## 2019-06-17 ENCOUNTER — Inpatient Hospital Stay: Payer: Medicare Other | Attending: Hematology & Oncology

## 2019-06-17 ENCOUNTER — Other Ambulatory Visit: Payer: Self-pay

## 2019-06-17 DIAGNOSIS — Z8589 Personal history of malignant neoplasm of other organs and systems: Secondary | ICD-10-CM | POA: Diagnosis not present

## 2019-06-17 DIAGNOSIS — Z452 Encounter for adjustment and management of vascular access device: Secondary | ICD-10-CM | POA: Insufficient documentation

## 2019-06-17 DIAGNOSIS — Z95828 Presence of other vascular implants and grafts: Secondary | ICD-10-CM

## 2019-06-17 DIAGNOSIS — D500 Iron deficiency anemia secondary to blood loss (chronic): Secondary | ICD-10-CM

## 2019-06-17 DIAGNOSIS — C801 Malignant (primary) neoplasm, unspecified: Secondary | ICD-10-CM

## 2019-06-17 DIAGNOSIS — G63 Polyneuropathy in diseases classified elsewhere: Secondary | ICD-10-CM

## 2019-06-17 MED ORDER — HEPARIN SOD (PORK) LOCK FLUSH 100 UNIT/ML IV SOLN
500.0000 [IU] | Freq: Once | INTRAVENOUS | Status: AC | PRN
  Administered 2019-06-17: 500 [IU]
  Filled 2019-06-17: qty 5

## 2019-06-17 MED ORDER — SODIUM CHLORIDE 0.9% FLUSH
10.0000 mL | INTRAVENOUS | Status: DC | PRN
  Administered 2019-06-17: 10 mL
  Filled 2019-06-17: qty 10

## 2019-06-17 NOTE — Patient Instructions (Signed)

## 2019-06-20 ENCOUNTER — Other Ambulatory Visit: Payer: Self-pay | Admitting: Internal Medicine

## 2019-06-30 ENCOUNTER — Other Ambulatory Visit: Payer: Self-pay | Admitting: Family

## 2019-06-30 DIAGNOSIS — F4323 Adjustment disorder with mixed anxiety and depressed mood: Secondary | ICD-10-CM

## 2019-07-21 ENCOUNTER — Encounter: Payer: Medicare Other | Attending: Psychology | Admitting: Psychology

## 2019-07-21 ENCOUNTER — Other Ambulatory Visit: Payer: Self-pay

## 2019-07-21 DIAGNOSIS — C801 Malignant (primary) neoplasm, unspecified: Secondary | ICD-10-CM | POA: Insufficient documentation

## 2019-07-21 DIAGNOSIS — G63 Polyneuropathy in diseases classified elsewhere: Secondary | ICD-10-CM | POA: Insufficient documentation

## 2019-07-21 DIAGNOSIS — F331 Major depressive disorder, recurrent, moderate: Secondary | ICD-10-CM | POA: Diagnosis present

## 2019-07-21 DIAGNOSIS — T451X5A Adverse effect of antineoplastic and immunosuppressive drugs, initial encounter: Secondary | ICD-10-CM

## 2019-07-21 DIAGNOSIS — G620 Drug-induced polyneuropathy: Secondary | ICD-10-CM | POA: Insufficient documentation

## 2019-07-21 DIAGNOSIS — R29818 Other symptoms and signs involving the nervous system: Secondary | ICD-10-CM | POA: Diagnosis not present

## 2019-07-21 DIAGNOSIS — R4189 Other symptoms and signs involving cognitive functions and awareness: Secondary | ICD-10-CM | POA: Diagnosis present

## 2019-07-22 NOTE — Progress Notes (Signed)
Neuropsychology Visit  Patient:  LILYANNAH ZUELKE   DOB: May 03, 1961  MR Number: [id number]  Location: [facility] [place], [place] [id number] [address] [place] [address] Dept: [phone]  Date of Service: 07/21/2019  Start: 11 AM End: 12 PM  Today's visit was a 1 hour in person visit that was conducted in my outpatient clinic office with myself and the patient present for this visit.  Duration of Service: 1 Hour  Provider/Observer:     Edgardo Roys PsyD  Chief Complaint:      Chief Complaint  Patient presents with  . Pain  . 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  The patient has had residual cognitive changes similar to possible impacts of chemotherapy as well as significant residual pain symptoms.  The patient was diagnosed and treated for cancer 10 years ago and continues to have ongoing follow-up regarding possible recurrence.  The patient has had neuropathy, insomnia and memory loss post chemotherapy.  The patient has depressive symptoms that have developed and has a hard time motivating and a hard time keeping focused on life and staying engaged.  Treatment Interventions:  Therapeutic interventions for issues associated with chronic pain, adjustment issues, depression and cognitive difficulties following a diagnosis and treatment for cancer which was primarily ovarian in nature.  Participation Level:   Active  Participation Quality:  Appropriate and Attentive      Behavioral Observation:  Well Groomed, Alert, and Appropriate.   Current Psychosocial Factors: The patient  reports that she has struggled with some of the residual effects of her ongoing cognitive difficulties and depression along with memory loss and insomnia.  Content of Session:   Reviewed current symptoms and worked on therapeutic interventions around issues of depression, cognitive difficulties and residual post chemotherapy.  Effectiveness of Interventions: The patient was very active and open during the visit we had an effective appointment.  Target Goals:   Target goals include working on building coping skills and strategies around issues of depression and residual effects of her chemotherapy including residual cognitive deficits.  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  The patient has had residual cognitive changes similar to possible impacts of chemotherapy as well as significant residual pain symptoms.  The patient was diagnosed and treated for cancer 10 years ago and continues to have ongoing follow-up regarding possible recurrence.  The patient has had neuropathy, insomnia and memory loss post chemotherapy.  The patient has depressive symptoms that have developed and has a hard time motivating and a hard time keeping focused on life and staying engaged.  The patient describes depressive symptoms, including motivation, feelings of helplessness and hopelessness and anxiety about recurrence of her cancer.  The patient also describes ongoing chronic pain symptoms and neuropathy as well as insomnia and memory loss.  Diagnosis:   Neuropathy associated with cancer (  [place])  Neuropathy due to chemotherapeutic drug [facility])  Neurocognitive deficits  Major depressive disorder, recurrent  episode, moderate (H[facility])  Cognitive impairment    Ilean Skill, Psy.D. Clinical Psychologist Neuropsychologist

## 2019-07-28 ENCOUNTER — Other Ambulatory Visit: Payer: Self-pay | Admitting: Family

## 2019-07-28 DIAGNOSIS — F4323 Adjustment disorder with mixed anxiety and depressed mood: Secondary | ICD-10-CM

## 2019-07-29 ENCOUNTER — Other Ambulatory Visit: Payer: Self-pay | Admitting: Internal Medicine

## 2019-07-29 DIAGNOSIS — Z1231 Encounter for screening mammogram for malignant neoplasm of breast: Secondary | ICD-10-CM

## 2019-08-02 ENCOUNTER — Other Ambulatory Visit: Payer: Self-pay | Admitting: Neurology

## 2019-08-02 MED ORDER — ZOLPIDEM TARTRATE 5 MG PO TABS: 5.0000 mg | ORAL_TABLET | Freq: Every evening | ORAL | 1 refills | Status: DC | PRN

## 2019-08-03 ENCOUNTER — Encounter: Payer: Self-pay | Admitting: Neurology

## 2019-08-05 ENCOUNTER — Encounter: Payer: Self-pay | Admitting: Family Medicine

## 2019-08-05 ENCOUNTER — Other Ambulatory Visit: Payer: Self-pay

## 2019-08-05 ENCOUNTER — Ambulatory Visit: Payer: Medicare Other | Admitting: Family Medicine

## 2019-08-05 VITALS — BP 112/80 | HR 86 | Temp 97.8°F | Ht 68.0 in | Wt 230.4 lb

## 2019-08-05 DIAGNOSIS — F329 Major depressive disorder, single episode, unspecified: Secondary | ICD-10-CM | POA: Diagnosis not present

## 2019-08-05 DIAGNOSIS — G3184 Mild cognitive impairment, so stated: Secondary | ICD-10-CM

## 2019-08-05 DIAGNOSIS — G4701 Insomnia due to medical condition: Secondary | ICD-10-CM

## 2019-08-05 NOTE — Progress Notes (Signed)
PATIENT: [name] DOB: 1960/10/28  REASON FOR VISIT: follow up HISTORY FROM: patient  Chief Complaint  Patient presents with  . Follow-up    Rm2. alone. Sleeping more. states that she is forgetful. States that therapy is helping with sleep issues.     HISTORY OF PRESENT ILLNESS: Today 08/05/19 [name] is a 59 y.o. female here today for follow up. She is seeing psychology. She feels that it helps. Sleep is stable. She is trying to only take 1 tablet of Ambien versus two. She continues Aricept 54m. She feels that memory waxes and wanes. She lives alone. She is able to perform all ADL's independently. She is able to drive. She makes "wrong turns" frequently but is able to get to her destination without difficulty. She declines MOCA today stating "that is not my problem." She feels that chemo has affected her ability to stay focused. She uses written instructions for making coffee each morning. She uses sticky notes. She gets overwhelmed with paying bills so she has hired someone to help her with this. She continues Lyrica 2062mdaily and feels this is very helpful. Lyrica is prescribed by oncology. She continues to see oncology every 3-4 months.   HISTORY: (copied from Dr Dohmeier's note on 11/25/2018)  I have the pleasure of seeing [name], a [age]ear old woman of african and caucasian mixed race who reports less trouble sleeping but trouble with memory, progressive.  She has neuropathy, had migraine, is at high risk of falling. 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 She has had spells that were syncopal, and some looking like seizures.  She reports feeling isolated during the corona pandemic, has seen neither children , grandchild, nor  her parents.she is tearful , appears very depressed. She takes Cymbalta for mood.     2016 : [name] is a [age]ear old female with a history of neuropathy,cognitive  changes and insomnia. She returns today for follow-up. The patient continues to take Ambien for insomnia. The patient states that if she does not take this medication she will not be able to go to sleep. The patient currently takes Lyrica 150 mg twice a day for neuropathy. Patient states that the burning and tingling is located in the toes extending to the mid foot bilaterally. She also has burning and tingling in the fingers that extends to the mid hand. Denies any changes with her gait or balance. That she had a fall while standing and watching fireworks. She states that the right leg went numb and cause her to fall. She states that this is the first time this has happened. She states that the numbness resolved. He states that she is not had any additional episodes of numbness. Patient feels that her memory has remained same. She states that she is able to do simple tasks but things that require more complex thinking she has difficulty with. For example she has trouble following plots in a TV show. She states that she has directions for how to make coffee posted. She is able to complete all ADLs independently. She operates a moTeacher, musicithout difficulty. She states that occasionally she'll take a wrong turn to a familiar place. She continues to take Aricept 10 mg daily. Patient states that she did have a sleep study area and she is unsure if she should be wearing oxygen at bedtime. She states that she had a dental procedure and the  technician noted that her oxygen saturation dropped while she was sleeping. She denies any new neurological issues. She returns today for an evaluation.  HISTORY 05/18/14 (CD): [name] is here today for her yearly revisit she is doing remarkably well knowing about her history of metastatic cancer with unknown primary to more she seems to have battled her disease very well.  She has lost some weight since her last visit, her blood pressure is well controlled. The HST  study performed on 10-23-13 showed obstructive sleep apnea not to be present, but some hypoxemia at night there were 109 minutes of oxygenation at 88% or less. Since this test was a home sleep test there is a higher likelihood of artifact. The patient does 1-2 times a night feel air hungry. She does wake occasionally up with a feeling that she doesn't breath enough. And her heart will be racing- palpitations have also not been seen in her home sleep test. She described sudden falls still being a concern, neuropathy contributing to this.  her cognition is improved, by her own subjective assessment as well as MOCA test today : scored 26 out of 30 points. This is a normal range for her cognitive function. Ambien dependent , unable to go to sleep without sleep aid for several years now.  Her list of medication was reviewed and there have been no recent changes. Her dog is her main social contact , besides her church family.   Interval history from 11/28/2015. [name] is here today and did excellent on today's memory testing. There is a variability between 26 and 22 points over the last 18 months. She mastered the trail making test, she recalled 3 out of 5 recall words. The patient had been advised in my last visit with her to avoid driving at night, she lives alone and she relies on her own transportation. She has not gotten lost. She has  an exercise regimen and she has implemented dietary changes. She is an excellent mood today and would like to reduce her Ambien from 12 point 5 at night to 10 mg which makes me very happy. I would like for her to continue with the Aricept I will follow her every 6 months.  Interval history from 06/05/2016, [name] at the meanwhile 59 year old woman with a long-standing history for ill-defined consult. She can continue to drive without difficulties and is independent in activities of daily living she reports rhinitis but I attributed to Aricept,  falls that seem to be neither syncopal, nor seizure, no mechanical in origin. Her falls are always propulsive, she falls forward she has very little warning or numb and she finds herself dominant how she got to the floor. Her mini entall test score 25 out of 30 points today which is stable and actually improved to a year ago. She is taking Lyrica for neuropathy and Lyrica is also a seizure medicine, I have wondered if Aricept may give her bradycardia but her heart rate today was 92.  Interval history from the tenth of January 2019, I have the pleasure of meeting today with [name], I follow her yearly and we usually look at her degree of fatigue, gait stability and cognitive status.  She scored today on a [place] cognitive assessment 23 out of 30 points and was last year 25 out of 30 points.  She missed 2 of the 5 recall words.  And the day of the week. She had another fall in October 2018 which she considered bad.  She describes that her legs entangled for some reason she fell onto her right hip she did not suffer fractures just a hematoma.  This happened on a non-slip even floor and she is still not sure what happened. No loss of conscience. Another fall at her sons house on the day before New years. "I sometimes cannot feel my feet and they end up entangled or in the wrong place at the wrong time.  This time I fell and slipped over a coffee table.  05-26-2018.RV , yearly for disability forms.  [name] has become a grandmother 98 month ago and is very happy, son and his family live in [place]. She has survived an unknown primary cancer by now for many years. Still having neuropathy - as expected, and memory problems.   REVIEW OF SYSTEMS: Out of a complete 14 system review of symptoms, the patient complains only of the following symptoms, insomnia, depression, memory loss and all other reviewed systems are negative.  ESS:10 FSS:53  ALLERGIES: Allergies  Allergen Reactions   . Codeine Nausea Only  . Orange Itching and Rash    HOME MEDICATIONS: Outpatient Medications Prior to Visit  Medication Sig Dispense Refill  . aspirin 81 MG tablet Take 81 mg by mouth daily. 2 TABS IN AM    . butalbital-acetaminophen-caffeine (FIORICET, ESGIC) 50-325-40 MG tablet TAKE 1 TABLET BY MOUTH EVERY 6 HOURS AS NEEDED FOR HEADACHE 10 tablet 3  . calcium carbonate (TUMS - DOSED IN MG ELEMENTAL CALCIUM) 500 MG chewable tablet Chew 1 tablet by mouth daily as needed for indigestion or heartburn.    . diclofenac (VOLTAREN) 75 MG EC tablet TK 1 T PO BID AFTER MEALS FOR INFLAMMATION / PAIN / SWELLING PRN ONLY  1  . donepezil (ARICEPT) 10 MG tablet TAKE 1 TABLET BY MOUTH EVERY MORNING 90 tablet 1  . DULoxetine (CYMBALTA) 60 MG capsule TAKE 1 CAPSULE BY MOUTH DAILY 90 capsule 0  . Multiple Vitamin (MULTIVITAMIN WITH MINERALS) TABS tablet Take 1 tablet by mouth daily.    . orphenadrine (NORFLEX) 100 MG tablet TAKE ONE TABLET BY MOUTH TWICE A DAY AS NEEDED FOR MUSCLE SPASMS 180 tablet 0  . potassium chloride (KLOR-CON) 10 MEQ tablet TAKE 3 TABLETS(30 MEQ) BY MOUTH THREE TIMES DAILY 270 tablet 1  . pregabalin (LYRICA) 200 MG capsule TAKE 1 CAPSULE BY MOUTH TWICE DAILY 60 capsule 0  . simvastatin (ZOCOR) 40 MG tablet TAKE 1 TABLET BY MOUTH DAILY 90 tablet 0  . triamterene-hydrochlorothiazide (MAXZIDE-25) 37.5-25 MG tablet TAKE 2 TABLETS BY MOUTH DAILY 180 tablet 0  . Vitamin D, Ergocalciferol, (DRISDOL) 1.25 MG (50000 UNIT) CAPS capsule TAKE 1 CAPSULE BY MOUTH 1 TIME A WEEK 13 capsule 2  . zolpidem (AMBIEN) 5 MG tablet Take 1-2 tablets (5-10 mg total) by mouth at bedtime as needed. 60 tablet 1  . sucralfate (CARAFATE) 1 g tablet Take 1 tablet (1 g total) by mouth 4 (four) times daily -  with meals and at bedtime. May dissolve/crush in 15 cc water (Patient not taking: Reported on 08/05/2019) 120 tablet 11  . CHANTIX 0.5 MG tablet TAKE 1 TABLET BY MOUTH TWICE DAILY 60 tablet 3   No  facility-administered medications prior to visit.    PAST MEDICAL HISTORY: Past Medical History:  Diagnosis Date  . Adenocarcinoma [facility])    unknown primary probaby ovarian 2009 chemo  . Adjustment disorder   . Allergic rhinitis   . Alopecia   . Anxiety and depression   .  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 HLD (hyperlipidemia)   . Hx pulmonary embolism   . Hypertension   . Iron deficiency anemia due to chronic blood loss 11/04/2016  . Metrorrhagia   . Neuropathy   . Obesity   . Ovarian cancer ([place]) 04/2008   ChemoTherapy  . 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 Raynaud's syndrome   . Right groin mass   . Squamous cell carcinoma of skin   . Vision abnormalities   . Vitamin D deficiency     PAST SURGICAL HISTORY: Past Surgical History:  Procedure Laterality Date  . ABDOMINAL HYSTERECTOMY  05/2008   TAH/BSO  . AUGMENTATION MAMMAPLASTY Bilateral 10+ years  . BREAST REDUCTION SURGERY  2000  . CESAREAN SECTION     1991  . COLONOSCOPY  2004; 12/07/10   hemorrhoids  . ESOPHAGOGASTRODUODENOSCOPY    . GASTRIC BYPASS  1979  . TOENAIL EXCISION Bilateral   . TONSILLECTOMY      FAMILY HISTORY: Family History  Problem Relation Age of Onset  . Pulmonary embolism Mother   . Hypertension Father   . Breast cancer Maternal Grandmother        diagnosed in early 40s  . Hyperlipidemia Other   . Breast cancer Sister 19    SOCIAL HISTORY: Social History   Socioeconomic History  . Marital status: Divorced    Spouse name: Not on file  . Number of children: 1  . Years of education: Master's  . Highest education level: Not on file  Occupational History    Employer: UNEMPLOYED    Comment: disabled  Tobacco Use  . Smoking status: Current Every Day Smoker    Packs/day: 1.00    Years: 37.00    Pack years: 37.00    Types: Cigarettes    Start date: 06/16/1978  . Smokeless tobacco: Never Used  . Tobacco comment: 2- 7-16  STILL SMOKING  Substance and Sexual Activity  .  Alcohol use: Yes    Alcohol/week: 0.0 standard drinks    Comment: rare, 3 weekly  . Drug use: No  . Sexual activity: Not on file  Other Topics Concern  . Not on file  Social History Narrative   Divorced   1 son /1 adopted adult son  2 dogs    Sales occupation on disability for now   Regular exercise-yes money stress   Daily caffeine use 2-3 and 2 cups of coffee   On since a security disability since 2012   Patient is right-handed.   Patient has a Master's degree.   Social Determinants of Health   Financial Resource Strain:   . Difficulty of Paying Living Expenses:   Food Insecurity:   . Worried About Charity fundraiser in the Last Year:   . Arboriculturist in the Last Year:   Transportation Needs:   . Film/video editor (Medical):   Marland Kitchen Lack of Transportation (Non-Medical):   Physical Activity:   . Days of Exercise per Week:   . Minutes of Exercise per Session:   Stress:   . Feeling of Stress :   Social Connections:   . Frequency of Communication with Friends and Family:   . Frequency of Social Gatherings with Friends and Family:   . Attends Religious Services:   . Active Member of Clubs or Organizations:   . Attends Archivist Meetings:   Marland Kitchen Marital Status:   Intimate Partner Violence:   . Fear of Current or Ex-Partner:   .  Emotionally Abused:   Marland Kitchen Physically Abused:   . Sexually Abused:       PHYSICAL EXAM  Vitals:   08/05/19 1123  BP: 112/80  Pulse: 86  Temp: 97.8 F (36.6 C)  Weight: 230 lb 6.4 oz (104.5 kg)  Height: 5' 8"  (1.727 m)   Body mass index is 35.03 kg/m.  Generalized: Well developed, in no acute distress  Cardiology: normal rate and rhythm, no murmur noted Neurological examination  Mentation: Alert oriented to time, place, history taking. Follows all commands speech and language fluent Cranial nerve II-XII: Pupils were equal round reactive to light. Extraocular movements were full, visual field were full on confrontational  test. Facial sensation and strength were normal. Uvula tongue midline. Head turning and shoulder shrug  were normal and symmetric. Motor: The motor testing reveals 5 over 5 strength of all 4 extremities. Good symmetric motor tone is noted throughout.  Sensory: Sensory testing is intact to soft touch on all 4 extremities. No evidence of extinction is noted.  Coordination: Cerebellar testing reveals good finger-nose-finger and heel-to-shin bilaterally.  Gait and station: Gait is normal.     [place]real Cognitive Assessment  11/25/2018 05/26/2018 05/22/2017 06/05/2016 11/28/2015  Visuospatial/ Executive (0/5) 3 4 3 4 4   Naming (0/3) 3 3 3 3 3   Attention: Read list of digits (0/2) 0 1 0 1 1  Attention: Read list of letters (0/1) 1 1 1 1 1   Attention: Serial 7 subtraction starting at 100 (0/3) 3 3 3 3 3   Language: Repeat phrase (0/2) 1 2 2 2 2   Language : Fluency (0/1) 1 1 1 1 1   Abstraction (0/2) 2 2 2 2 2   Delayed Recall (0/5) 3 3 3 2 3   Orientation (0/6) 5 5 5 6 5   Total 22 25 23 25 25   Adjusted Score (based on education) - - - 25 25    DIAGNOSTIC DATA (LABS, IMAGING, TESTING) - I reviewed patient records, labs, notes, testing and imaging myself where available.  No flowsheet data found.   Lab Results  Component Value Date   WBC 6.7 02/25/2019   HGB 13.6 02/25/2019   HCT 41.0 02/25/2019   MCV 87.0 02/25/2019   PLT 298 02/25/2019      Component Value Date/Time   NA 138 02/25/2019 1513   NA 147 (H) 04/16/2017 1152   NA 139 12/27/2015 1156   K 3.6 02/25/2019 1513   K 3.4 04/16/2017 1152   K 3.9 12/27/2015 1156   CL 101 02/25/2019 1513   CL 101 04/16/2017 1152   CO2 27 02/25/2019 1513   CO2 30 04/16/2017 1152   CO2 27 12/27/2015 1156   GLUCOSE 90 02/25/2019 1513   GLUCOSE 89 04/16/2017 1152   BUN 38 (H) 02/25/2019 1513   BUN 18 04/16/2017 1152   BUN 14.9 12/27/2015 1156   CREATININE 1.09 (H) 02/25/2019 1513   CREATININE 1.2 04/16/2017 1152   CREATININE 0.9 12/27/2015 1156    CALCIUM 10.0 02/25/2019 1513   CALCIUM 9.8 04/16/2017 1152   CALCIUM 9.7 12/27/2015 1156   PROT 6.5 02/25/2019 1513   PROT 6.5 04/16/2017 1152   PROT 6.8 12/27/2015 1156   ALBUMIN 4.1 02/25/2019 1513   ALBUMIN 3.6 04/16/2017 1152   ALBUMIN 4.0 07/08/2016 1432   ALBUMIN 3.5 12/27/2015 1156   AST 34 02/25/2019 1513   AST 29 12/27/2015 1156   ALT 50 (H) 02/25/2019 1513   ALT 26 04/16/2017 1152   ALT 28 12/27/2015 1156  ALKPHOS 73 02/25/2019 1513   ALKPHOS 87 (H) 04/16/2017 1152   ALKPHOS 106 12/27/2015 1156   BILITOT 0.4 02/25/2019 1513   BILITOT 0.33 12/27/2015 1156   GFRNONAA 56 (L) 02/25/2019 1513   GFRAA >60 02/25/2019 1513   Lab Results  Component Value Date   CHOL 185 02/09/2019   HDL 45.50 02/09/2019   LDLCALC 101 (H) 02/09/2019   LDLDIRECT 98.7 09/04/2009   TRIG 189.0 (H) 02/09/2019   CHOLHDL 4 02/09/2019   Lab Results  Component Value Date   HGBA1C 6.2 02/09/2019   Lab Results  Component Value Date   HFWYOVZC58 850 01/27/2012   Lab Results  Component Value Date   TSH 6.42 (H) 02/09/2019     ASSESSMENT AND PLAN 59 y.o. 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[place]), Adjustment disorder, Allergic rhinitis, Alopecia, Anxiety and depression, BRCA1 positive (01/27/2013), HLD (hyperlipidemia), pulmonary embolism, Hypertension, Iron deficiency anemia due to chronic blood loss (11/04/2016), Metrorrhagia, Neuropathy, Obesity, Ovarian cancer ([place]) (04/2008), Ovarian cancer genetic susceptibility (01/27/2013), Raynaud's syndrome, Right groin mass, Squamous cell carcinoma of skin, Vision abnormalities, and Vitamin D deficiency. here with     ICD-10-CM   1. 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 Insomnia due to medical condition  G47.01   3. MCI (mild cognitive impairment) with memory loss  G31.84     [name] is doing fairly well. She continues to have difficulty with sleep but is doing better with regular psychological counseling. She feels that mood is  stable. Neuropathy pain is managed with Lyrica prescribed by oncology. She will continue Aricept as she does feel this helps. Memory complaints most likely related to previous chemo therapy as well as anxiety/depression. She is able to maintain her home and independent living. She was encouraged to work on healthy lifestyle habits and follow up closely with PCP, psychology and oncology. She will return to see [place] in 6 months. She verbalizes understanding and agreement with this plan.    No orders of the defined types were placed in this encounter.    No orders of the defined types were placed in this encounter.     I spent 30 minutes with the patient. 50% of this time was spent counseling and educating patient on plan of care and medications.    Debbora Presto, FNP-C 08/05/2019, 12:29 PM [facility] [address], [place], [place] [address] [phone]

## 2019-08-05 NOTE — Patient Instructions (Signed)
Continue close follow up with psychology, PCP and oncology as directed. Continue Aricept and Ambien. Sleep hygiene recommended.   Follow up in 6 months   Major Depressive Disorder, Adult Major depressive disorder (MDD) is a mental health condition. MDD often makes you feel sad, hopeless, or helpless. MDD can also cause symptoms in your body. MDD can affect your:  Work.  School.  Relationships.  Other normal activities. MDD can range from mild to very bad. It may occur once (single episode MDD). It can also occur many times (recurrent MDD). The main symptoms of MDD often include:  Feeling sad, depressed, or irritable most of the time.  Loss of interest. MDD symptoms also include:  Sleeping too much or too little.  Eating too much or too little.  A change in your weight.  Feeling tired (fatigue) or having low energy.  Feeling worthless.  Feeling guilty.  Trouble making decisions.  Trouble thinking clearly.  Thoughts of suicide or harming others.  Feeling weak.  Feeling agitated.  Keeping yourself from being around other people (isolation). Follow these instructions at home: Activity  Do these things as told by your doctor: ? Go back to your normal activities. ? Exercise regularly. ? Spend time outdoors. Alcohol  Talk with your doctor about how alcohol can affect your antidepressant medicines.  Do not drink alcohol. Or, limit how much alcohol you drink. ? This means no more than 1 drink a day for nonpregnant women and 2 drinks a day for men. One drink equals one of these:  12 oz of beer.  5 oz of wine.  1 oz of hard liquor. General instructions  Take over-the-counter and prescription medicines only as told by your doctor.  Eat a healthy diet.  Get plenty of sleep.  Find activities that you enjoy. Make time to do them.  Think about joining a support group. Your doctor may be able to suggest a group for you.  Keep all follow-up visits as told  by your doctor. This is important. Where to find more information:  [employer] on Mental Illness: ? www.nami.[place]: ? https://carter.com/  National Suicide Prevention Lifeline: ? 740-662-5192. This is free, 24-hour help. Contact a doctor if:  Your symptoms get worse.  You have new symptoms. Get help right away if:  You self-harm.  You see, hear, taste, smell, or feel things that are not present (hallucinate). If you ever feel like you may hurt yourself or others, or have thoughts about taking your own life, get help right away. You can go to your nearest emergency department or call:  Your local emergency services (911 in the [place]  A suicide crisis helpline, such as the National Suicide Prevention Lifeline: ? 848 458 6563. This is open 24 hours a day. This information is not intended to replace advice given to you by your health care provider. Make sure you discuss any questions you have with your health care provider. Document Revised: 04/11/2017 Document Reviewed: 01/14/2016 Elsevier Patient Education  2020 [employer].

## 2019-08-11 ENCOUNTER — Other Ambulatory Visit: Payer: Self-pay

## 2019-08-11 ENCOUNTER — Encounter: Payer: Medicare Other | Admitting: Psychology

## 2019-08-11 DIAGNOSIS — F331 Major depressive disorder, recurrent, moderate: Secondary | ICD-10-CM

## 2019-08-11 DIAGNOSIS — G620 Drug-induced polyneuropathy: Secondary | ICD-10-CM | POA: Diagnosis not present

## 2019-08-11 DIAGNOSIS — R29818 Other symptoms and signs involving the nervous system: Secondary | ICD-10-CM

## 2019-08-11 DIAGNOSIS — T451X5A Adverse effect of antineoplastic and immunosuppressive drugs, initial encounter: Secondary | ICD-10-CM | POA: Diagnosis not present

## 2019-08-11 DIAGNOSIS — R4189 Other symptoms and signs involving cognitive functions and awareness: Secondary | ICD-10-CM

## 2019-08-11 DIAGNOSIS — G63 Polyneuropathy in diseases classified elsewhere: Secondary | ICD-10-CM

## 2019-08-11 DIAGNOSIS — C801 Malignant (primary) neoplasm, unspecified: Secondary | ICD-10-CM | POA: Diagnosis not present

## 2019-08-12 ENCOUNTER — Other Ambulatory Visit: Payer: Self-pay

## 2019-08-12 ENCOUNTER — Inpatient Hospital Stay: Payer: Medicare Other | Attending: Hematology & Oncology

## 2019-08-12 ENCOUNTER — Encounter: Payer: Self-pay | Admitting: Psychology

## 2019-08-12 DIAGNOSIS — Z8589 Personal history of malignant neoplasm of other organs and systems: Secondary | ICD-10-CM | POA: Insufficient documentation

## 2019-08-12 DIAGNOSIS — D500 Iron deficiency anemia secondary to blood loss (chronic): Secondary | ICD-10-CM

## 2019-08-12 DIAGNOSIS — C801 Malignant (primary) neoplasm, unspecified: Secondary | ICD-10-CM

## 2019-08-12 DIAGNOSIS — Z452 Encounter for adjustment and management of vascular access device: Secondary | ICD-10-CM | POA: Diagnosis not present

## 2019-08-12 DIAGNOSIS — Z95828 Presence of other vascular implants and grafts: Secondary | ICD-10-CM

## 2019-08-12 MED ORDER — HEPARIN SOD (PORK) LOCK FLUSH 100 UNIT/ML IV SOLN
500.0000 [IU] | Freq: Once | INTRAVENOUS | Status: AC | PRN
  Administered 2019-08-12: 500 [IU]
  Filled 2019-08-12: qty 5

## 2019-08-12 MED ORDER — SODIUM CHLORIDE 0.9% FLUSH
10.0000 mL | INTRAVENOUS | Status: DC | PRN
  Administered 2019-08-12: 10 mL
  Filled 2019-08-12: qty 10

## 2019-08-12 NOTE — Progress Notes (Signed)
Neuropsychology Visit  Patient:  [name]   DOB: 21-Jun-1960  MR Number: [id number]  Location: [facility] [place], [place] [id number] [place] [address] Dept: [phone]  Date of Service: 08/11/2019  Start: 11 AM End: 12 PM  Today's visit was a 1 hour in person visit that was conducted in my outpatient clinic office with myself and the patient present for this visit.  Duration of Service: 1 Hour  Provider/Observer:     Edgardo Roys PsyD  Chief Complaint:      Chief Complaint  Patient presents with  . Pain  . Depression  . 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  The patient has had residual cognitive changes similar to possible impacts of chemotherapy as well as significant residual pain symptoms.  The patient was diagnosed and treated for cancer 10 years ago and continues to have ongoing follow-up regarding possible recurrence.  The patient has had neuropathy, insomnia and memory loss post chemotherapy.  The patient has depressive symptoms that have developed and has a hard time motivating and a hard time keeping focused on life and staying engaged.  Treatment Interventions:  Therapeutic interventions for issues associated with chronic pain, adjustment issues, depression and cognitive difficulties following a diagnosis and treatment for cancer which was primarily ovarian in nature.  Participation Level:   Active  Participation Quality:  Appropriate and Attentive      Behavioral Observation:  Well Groomed, Alert, and Appropriate.   Current Psychosocial  Factors: The patient reports that she continues to struggle with residual effects of her cognitive difficulties and depressive symptomatology and the effects they have had on her motivation and ability to keep up with basic life requirements resulting in feeling overwhelmed with not keeping up with daily requirements including keeping up with mail and bills.  Content of Session:   Reviewed current symptoms and worked on therapeutic interventions around issues of depression, cognitive difficulties and residual post chemotherapy.  Effectiveness of Interventions: The patient was very active and open during the visit we had an effective appointment.  Target Goals:   Target goals include working on building coping skills and strategies around issues of depression and residual effects of her chemotherapy including residual cognitive deficits.  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  The patient has had residual cognitive changes similar to possible impacts of chemotherapy as well as significant residual pain symptoms.  The patient was diagnosed and treated for cancer 10 years ago and continues to have ongoing follow-up regarding possible recurrence.  The patient has had neuropathy, insomnia and memory loss post chemotherapy.  The patient has depressive symptoms that have developed and has a hard time motivating and a hard time keeping focused on life and staying engaged.  The patient describes depressive symptoms, including motivation, feelings of helplessness and hopelessness and anxiety  about recurrence of her cancer.  The patient also describes ongoing chronic pain symptoms and neuropathy as  well as insomnia and memory loss.  Today we worked specifically on behavioral adaptations and building coping skills around issues of her difficulty completing necessary daily activities and daily requirements as her cognitive change in cognitive impairments and impact they have had on emotional status and self-esteem issues are having a very significant deleterious effect on her.  Diagnosis:   Neuropathy associated with cancer (HCC)  Neuropathy due to chemotherapeutic drug [facility])  Neurocognitive deficits  Major depressive disorder, recurrent episode, moderate (HCC)    Ilean Skill, Psy.D. Clinical Psychologist Neuropsychologist

## 2019-08-18 ENCOUNTER — Ambulatory Visit
Admission: RE | Admit: 2019-08-18 | Discharge: 2019-08-18 | Disposition: A | Discharge status: Home / Self Care | Payer: Medicare Other | Source: Ambulatory Visit | Attending: Internal Medicine | Admitting: Internal Medicine

## 2019-08-18 ENCOUNTER — Other Ambulatory Visit: Payer: Self-pay

## 2019-08-18 DIAGNOSIS — Z1231 Encounter for screening mammogram for malignant neoplasm of breast: Secondary | ICD-10-CM

## 2019-08-30 ENCOUNTER — Other Ambulatory Visit: Payer: Self-pay | Admitting: Family

## 2019-08-30 DIAGNOSIS — F4323 Adjustment disorder with mixed anxiety and depressed mood: Secondary | ICD-10-CM

## 2019-09-01 ENCOUNTER — Other Ambulatory Visit: Payer: Self-pay | Admitting: Family

## 2019-09-01 ENCOUNTER — Other Ambulatory Visit: Payer: Self-pay | Admitting: Neurology

## 2019-09-01 ENCOUNTER — Other Ambulatory Visit: Payer: Self-pay | Admitting: Internal Medicine

## 2019-09-01 ENCOUNTER — Encounter: Payer: Medicare Other | Attending: Psychology | Admitting: Psychology

## 2019-09-01 DIAGNOSIS — G63 Polyneuropathy in diseases classified elsewhere: Secondary | ICD-10-CM | POA: Insufficient documentation

## 2019-09-01 DIAGNOSIS — G4701 Insomnia due to medical condition: Secondary | ICD-10-CM

## 2019-09-01 DIAGNOSIS — F331 Major depressive disorder, recurrent, moderate: Secondary | ICD-10-CM | POA: Insufficient documentation

## 2019-09-01 DIAGNOSIS — G620 Drug-induced polyneuropathy: Secondary | ICD-10-CM | POA: Insufficient documentation

## 2019-09-01 DIAGNOSIS — C801 Malignant (primary) neoplasm, unspecified: Secondary | ICD-10-CM | POA: Insufficient documentation

## 2019-09-01 DIAGNOSIS — T451X5A Adverse effect of antineoplastic and immunosuppressive drugs, initial encounter: Secondary | ICD-10-CM | POA: Insufficient documentation

## 2019-09-01 DIAGNOSIS — H8113 Benign paroxysmal vertigo, bilateral: Secondary | ICD-10-CM

## 2019-09-01 DIAGNOSIS — G43109 Migraine with aura, not intractable, without status migrainosus: Secondary | ICD-10-CM

## 2019-09-01 DIAGNOSIS — R29818 Other symptoms and signs involving the nervous system: Secondary | ICD-10-CM | POA: Insufficient documentation

## 2019-09-01 DIAGNOSIS — H53143 Visual discomfort, bilateral: Secondary | ICD-10-CM

## 2019-09-01 DIAGNOSIS — R4189 Other symptoms and signs involving cognitive functions and awareness: Secondary | ICD-10-CM | POA: Insufficient documentation

## 2019-09-01 MED ORDER — DONEPEZIL HCL 10 MG PO TABS: 10.0000 mg | ORAL_TABLET | Freq: Every morning | ORAL | 1 refills | Status: DC

## 2019-09-22 ENCOUNTER — Other Ambulatory Visit: Payer: Self-pay

## 2019-09-22 ENCOUNTER — Encounter: Payer: Medicare Other | Attending: Psychology | Admitting: Psychology

## 2019-09-22 DIAGNOSIS — G620 Drug-induced polyneuropathy: Secondary | ICD-10-CM | POA: Diagnosis not present

## 2019-09-22 DIAGNOSIS — G63 Polyneuropathy in diseases classified elsewhere: Secondary | ICD-10-CM | POA: Diagnosis not present

## 2019-09-22 DIAGNOSIS — C801 Malignant (primary) neoplasm, unspecified: Secondary | ICD-10-CM | POA: Diagnosis not present

## 2019-09-22 DIAGNOSIS — F331 Major depressive disorder, recurrent, moderate: Secondary | ICD-10-CM | POA: Insufficient documentation

## 2019-09-22 DIAGNOSIS — F4323 Adjustment disorder with mixed anxiety and depressed mood: Secondary | ICD-10-CM | POA: Diagnosis not present

## 2019-09-22 DIAGNOSIS — T451X5A Adverse effect of antineoplastic and immunosuppressive drugs, initial encounter: Secondary | ICD-10-CM | POA: Diagnosis not present

## 2019-09-22 DIAGNOSIS — R29818 Other symptoms and signs involving the nervous system: Secondary | ICD-10-CM | POA: Insufficient documentation

## 2019-09-22 DIAGNOSIS — R4189 Other symptoms and signs involving cognitive functions and awareness: Secondary | ICD-10-CM | POA: Diagnosis present

## 2019-09-28 DIAGNOSIS — Z0289 Encounter for other administrative examinations: Secondary | ICD-10-CM

## 2019-09-29 NOTE — Progress Notes (Signed)
Neuropsychology Visit  Patient:  [name]   DOB: 06-Nov-1960  MR Number: [id number]  Location: [facility] [place], [place] [id number] [place] [address] Dept: [phone]  Date of Service: 09/22/2019  Start: 11 AM End: 12 PM  Today's visit was a 1 hour in person visit that was conducted in my outpatient clinic office with myself and the patient present for this visit.  Duration of Service: 1 Hour  Provider/Observer:     Edgardo Roys PsyD  Chief Complaint:      No chief complaint on file.   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  The patient has had residual cognitive changes similar to possible impacts of chemotherapy as well as significant residual pain symptoms.  The patient was diagnosed and treated for cancer 10 years ago and continues to have ongoing follow-up regarding possible recurrence.  The patient has had neuropathy, insomnia and memory loss post chemotherapy.  The patient has depressive symptoms that have developed and has a hard time motivating and a hard time keeping focused on life and staying engaged.  Treatment Interventions:  Therapeutic interventions for issues associated with chronic pain, adjustment issues, depression and cognitive difficulties following a diagnosis and treatment for cancer which was primarily ovarian in nature.  Participation Level:   Active  Participation Quality:  Appropriate and Attentive      Behavioral Observation:  Well Groomed, Alert, and Appropriate.   Current Psychosocial Factors: The patient has been able to make some  significant gains in keeping up with day-to-day requirements such as bills and other mail items that she is struggled with managing and completing..  Content of Session:   Reviewed current symptoms and worked on therapeutic interventions around issues of depression, cognitive difficulties and residual post chemotherapy.  Effectiveness of Interventions: The patient was very active and open during the visit we had an effective appointment.  Target Goals:   Target goals include working on building coping skills and strategies around issues of depression and residual effects of her chemotherapy including residual cognitive deficits.  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  The patient has had residual cognitive changes similar to possible impacts of chemotherapy as well as significant residual pain symptoms.  The patient was diagnosed and treated for cancer 10 years ago and continues to have ongoing follow-up regarding possible recurrence.  The patient has had neuropathy, insomnia and memory loss post chemotherapy.  The patient has depressive symptoms that have developed and has a hard time motivating and a hard time keeping focused on life and staying engaged.  The patient describes depressive symptoms, including motivation, feelings of helplessness and hopelessness and anxiety about recurrence of her cancer.  The patient also describes ongoing chronic pain symptoms and neuropathy as well as insomnia and memory loss.  Today we worked specifically on behavioral adaptations and building coping  skills around issues of her difficulty completing necessary daily activities and daily requirements  as her cognitive change in cognitive impairments and impact they have had on emotional status and self-esteem issues are having a very significant deleterious effect on her.  Diagnosis:   Neuropathy associated with cancer (HCC)  Neurocognitive deficits  Major depressive disorder, recurrent episode, moderate (HCC)  Adjustment disorder with mixed anxiety and depressed mood    Ilean Skill, Psy.D. Clinical Psychologist Neuropsychologist

## 2019-09-30 ENCOUNTER — Telehealth: Payer: Self-pay | Admitting: Internal Medicine

## 2019-09-30 NOTE — Chronic Care Management (AMB) (Signed)
  Chronic Care Management   Note  09/30/2019 Name: JAQUELYNN WANAMAKER MRN: [id number] DOB: February 23, 1961  Elon Alas Saxon-Mace is a 59 y.o. year old female who is a primary care patient of Panosh, Standley Brooking, MD. I reached out to [place] by phone today in response to a referral sent by Ms. Elon Alas Saxon-Mace's PCP, Panosh, Standley Brooking, MD.   Ms. Acoff was given information about Chronic Care Management services today including:  1. CCM service includes personalized support from designated clinical staff supervised by her physician, including individualized plan of care and coordination with other care providers 2. 24/7 contact phone numbers for assistance for urgent and routine care needs. 3. Service will only be billed when office clinical staff spend 20 minutes or more in a month to coordinate care. 4. Only one practitioner may furnish and bill the service in a calendar month. 5. The patient may stop CCM services at any time (effective at the end of the month) by phone call to the office staff.   Patient agreed to services and verbal consent obtained.   Follow up plan:   [place]

## 2019-10-07 ENCOUNTER — Other Ambulatory Visit: Payer: Self-pay | Admitting: Family

## 2019-10-07 DIAGNOSIS — F4323 Adjustment disorder with mixed anxiety and depressed mood: Secondary | ICD-10-CM

## 2019-10-12 ENCOUNTER — Other Ambulatory Visit: Payer: Self-pay

## 2019-10-12 ENCOUNTER — Inpatient Hospital Stay: Payer: Medicare Other | Attending: Hematology & Oncology

## 2019-10-12 DIAGNOSIS — Z8589 Personal history of malignant neoplasm of other organs and systems: Secondary | ICD-10-CM | POA: Insufficient documentation

## 2019-10-12 DIAGNOSIS — D500 Iron deficiency anemia secondary to blood loss (chronic): Secondary | ICD-10-CM

## 2019-10-12 DIAGNOSIS — G63 Polyneuropathy in diseases classified elsewhere: Secondary | ICD-10-CM

## 2019-10-12 DIAGNOSIS — Z452 Encounter for adjustment and management of vascular access device: Secondary | ICD-10-CM | POA: Insufficient documentation

## 2019-10-12 DIAGNOSIS — Z95828 Presence of other vascular implants and grafts: Secondary | ICD-10-CM

## 2019-10-12 MED ORDER — HEPARIN SOD (PORK) LOCK FLUSH 100 UNIT/ML IV SOLN
500.0000 [IU] | Freq: Once | INTRAVENOUS | Status: AC | PRN
  Administered 2019-10-12: 500 [IU]
  Filled 2019-10-12: qty 5

## 2019-10-12 MED ORDER — SODIUM CHLORIDE 0.9% FLUSH
10.0000 mL | INTRAVENOUS | Status: DC | PRN
  Administered 2019-10-12: 10 mL
  Filled 2019-10-12: qty 10

## 2019-10-12 NOTE — Patient Instructions (Signed)

## 2019-10-13 ENCOUNTER — Other Ambulatory Visit: Payer: Self-pay | Admitting: *Deleted

## 2019-10-14 ENCOUNTER — Other Ambulatory Visit: Payer: Self-pay | Admitting: *Deleted

## 2019-10-14 MED ORDER — ZOLPIDEM TARTRATE 5 MG PO TABS: 5.0000 mg | ORAL_TABLET | Freq: Every evening | ORAL | 1 refills | Status: DC | PRN

## 2019-10-27 ENCOUNTER — Other Ambulatory Visit: Payer: Self-pay

## 2019-10-27 DIAGNOSIS — E785 Hyperlipidemia, unspecified: Secondary | ICD-10-CM

## 2019-10-27 DIAGNOSIS — I10 Essential (primary) hypertension: Secondary | ICD-10-CM

## 2019-10-27 NOTE — Chronic Care Management (AMB) (Deleted)
Chronic Care Management Pharmacy  Name: [name]  MRN: [id number] DOB: August 01, 1960   Chief Complaint/ HPI  [name],  59 y.o. , female presents for their {Initial/Follow-up:3041532} CCM visit with the clinical pharmacist {CHL HP Upstream Pharm visit GGYI:[id number]}.  PCP : Burnis Medin, MD  Their chronic conditions include: HTN, HLD, GERD, migraine with aura, polyneuropathy, low back pain with sciatica, insomnia, cognitive impairment with chemotherapy, vitamin D deficiency  *** 5/20  Office Visits: 02/08/19 OV - f/u for sx of transient orthostatic light headedness and bp elevation. Labs ordered. No changes with meds.  Consult Visit: None  Medications: Outpatient Encounter Medications as of 11/04/2019  Medication Sig  . aspirin 81 MG tablet Take 81 mg by mouth daily. 2 TABS IN AM  . butalbital-acetaminophen-caffeine (FIORICET, ESGIC) 50-325-40 MG tablet TAKE 1 TABLET BY MOUTH EVERY 6 HOURS AS NEEDED FOR HEADACHE  . calcium carbonate (TUMS - DOSED IN MG ELEMENTAL CALCIUM) 500 MG chewable tablet Chew 1 tablet by mouth daily as needed for indigestion or heartburn.  . diclofenac (VOLTAREN) 75 MG EC tablet TK 1 T PO BID AFTER MEALS FOR INFLAMMATION / PAIN / SWELLING PRN ONLY  . donepezil (ARICEPT) 10 MG tablet Take 1 tablet (10 mg total) by mouth every morning.  . DULoxetine (CYMBALTA) 60 MG capsule TAKE 1 CAPSULE BY MOUTH DAILY  . Multiple Vitamin (MULTIVITAMIN WITH MINERALS) TABS tablet Take 1 tablet by mouth daily.  . orphenadrine (NORFLEX) 100 MG tablet TAKE ONE TABLET BY MOUTH TWICE A DAY AS NEEDED FOR MUSCLE SPASMS  . potassium chloride (KLOR-CON) 10 MEQ tablet TAKE 3 TABLETS(30 MEQ) BY MOUTH THREE TIMES DAILY  . pregabalin (LYRICA) 200 MG capsule TAKE 1 CAPSULE BY MOUTH TWICE DAILY  . simvastatin (ZOCOR) 40 MG tablet TAKE 1 TABLET BY MOUTH DAILY  . sucralfate (CARAFATE) 1 g tablet Take 1 tablet (1 g total) by mouth 4 (four) times daily -  with meals and at  bedtime. May dissolve/crush in 15 cc water (Patient not taking: Reported on 08/05/2019)  . triamterene-hydrochlorothiazide (MAXZIDE-25) 37.5-25 MG tablet TAKE 2 TABLETS BY MOUTH DAILY  . Vitamin D, Ergocalciferol, (DRISDOL) 1.25 MG (50000 UNIT) CAPS capsule TAKE 1 CAPSULE BY MOUTH 1 TIME A WEEK  . zolpidem (AMBIEN) 5 MG tablet Take 1-2 tablets (5-10 mg total) by mouth at bedtime as needed.   No facility-administered encounter medications on file as of 11/04/2019.     Current Diagnosis/Assessment:  Goals Addressed   None     {CHL HP Upstream Pharmacy Diagnosis/[phone]}   Hypertension   BP today is:  {CHL HP UPSTREAM Pharmacist BP ranges:[phone]}  Office blood pressures are  BP Readings from Last 3 Encounters:  08/05/19 112/80  03/09/19 108/75  03/04/19 118/82    Patient has failed these meds in the past: None Patient is currently {CHL Controlled/[phone]} on the following medications:  . Triamterene/HCTZ 37.5/25 mg 2 tablets daily  Patient checks BP at home {CHL HP BP Monitoring Frequency:[id number]}  Patient home BP readings are ranging: ***  We discussed {CHL HP Upstream Pharmacy discussion:[phone]}  Plan  Continue {CHL HP Upstream Pharmacy Plans:[phone]}     Hyperlipidemia   Lipid Panel     Component Value Date/Time   CHOL 185 02/09/2019 1013   TRIG 189.0 (H) 02/09/2019 1013   HDL 45.50 02/09/2019 1013   CHOLHDL 4 02/09/2019 1013   VLDL 37.8 02/09/2019 1013   LDLCALC 101 (H) 02/09/2019 1013   LDLDIRECT 98.7 09/04/2009 0943  The 10-year ASCVD risk score Mikey Bussing DC Brooke Bonito., et al., 2013) is: 7.1%   Values used to calculate the score:     Age: 49 years     Sex: Female     Is Non-Hispanic African American: No     Diabetic: No     Tobacco smoker: Yes     Systolic Blood Pressure: 071 mmHg     Is BP treated: Yes     HDL Cholesterol: 45.5 mg/dL     Total Cholesterol: 185 mg/dL   Patient has failed these meds in past:  None Patient is currently {CHL Controlled/[phone]} on the following medications:  . Simvastatin 40 mg 1 tablet daily  We discussed:  {CHL HP Upstream Pharmacy discussion:[phone]}  Plan  Continue {CHL HP Upstream Pharmacy Plans:[phone]}   Neuropathy   Patient has failed these meds in past: None Patient is currently {CHL Controlled/[phone]} on the following medications:  . Lyrica 200 mg 1 capsule twice daily . Duloxetine 60 mg 1 capsule daily  We discussed:  ***  Plan  Continue {CHL HP Upstream Pharmacy Plans:[phone]}   Lower Back Pain   Patient has failed these meds in past: tramadol Patient is currently {CHL Controlled/Uncontrolled:([phone]} on the following medications:   Orphenadrine 100 mg 1 tablet twice daily as needed for muscle spasms  Diclofenac EC 75 mg 1 tablet twice daily with meals as needed  We discussed:  ***  Plan  Continue {CHL HP Upstream Pharmacy Plans:[phone]}   Vitamin D deficiency   Patient has failed these meds in past: None Patient is currently {CHL Controlled/[phone]} on the following medications:  Marland Kitchen Vitamin D 50,000 units 1 capsule once a week  We discussed:  ***  Plan  Continue {CHL HP Upstream Pharmacy Plans:[phone]}    Insomnia with anxiety   Patient has failed these meds in past: diazepam, desvenlafaxine, trazodone Patient is currently {CHL Controlled/[phone]} on the following medications:  Marland Kitchen Zolpidem 5 mg 1 tablet at bedtime as needed  We discussed:  ***  Plan  Continue {CHL HP Upstream Pharmacy QRFXJ:[id number]}    GERD   Patient has failed these meds in past: esomeprazole, famotidine, probiotic Patient is currently {CHL Controlled/[phone]} on the following medications:  . Sucralfate 1 g 4 times daily with meals and at bedtime. May dissolve/crush on 15 cc water . Tums 500 mg chewable 1 tablet daily as needed  We  discussed:  ***  Plan  Continue {CHL HP Upstream Pharmacy Plans:[phone]}    Migraine with aura   Patient has failed these meds in past: None Patient is currently {CHL Controlled/[phone]} on the following medications:  . 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 Donepezil 10 mg 1 tablet every morning  We discussed:  ***  Plan  Continue {CHL HP Upstream Pharmacy EBRAX:[id number]}    OTCs/Health Maintenance   Patient is currently {CHL Controlled/Uncontrolled:([phone]} on the following medications: . Aspirin 81 mg 2 tablets in the morning *** . Multivitamin 1 tablet daily . Klor-con 10 meq 3 tablets 3 times daily  We discussed:  ***  Plan  Continue {CHL HP Upstream Pharmacy PJSRP:[id number]}   Vaccines   Reviewed and discussed patient's vaccination history.    Immunization History  Administered Date(s) Administered  .  Hep A / Hep B 06/16/2018, 06/16/2018, 08/09/2018  . Influenza Split 03/02/2014  . Influenza Whole 03/05/2007, 02/11/2012  . Influenza,inj,Quad PF,6+ Mos 03/24/2015, 01/08/2018, 02/08/2019  . Influenza-Unspecified 03/10/2017  . Pneumococcal Conjugate-13 11/02/2013  . Td 12/12/2002  . Tdap 11/02/2013  . Zoster 08/11/2012    Plan  Recommended patient receive *** vaccine in *** office/pharmacy.    Medication Management   Pharmacy/Benefits: [facility] / [facility] Adherence:  Pt endorses ***% compliance  We discussed: ***  Plan  {US Pharmacy TXQH:23799}   Follow up: *** month phone visit

## 2019-11-04 ENCOUNTER — Telehealth: Payer: Medicare Other

## 2019-11-04 ENCOUNTER — Other Ambulatory Visit: Payer: Self-pay

## 2019-11-04 ENCOUNTER — Encounter: Payer: Medicare Other | Attending: Psychology | Admitting: Psychology

## 2019-11-04 DIAGNOSIS — T451X5A Adverse effect of antineoplastic and immunosuppressive drugs, initial encounter: Secondary | ICD-10-CM | POA: Diagnosis not present

## 2019-11-04 DIAGNOSIS — R29818 Other symptoms and signs involving the nervous system: Secondary | ICD-10-CM | POA: Diagnosis not present

## 2019-11-04 DIAGNOSIS — R4189 Other symptoms and signs involving cognitive functions and awareness: Secondary | ICD-10-CM | POA: Insufficient documentation

## 2019-11-04 DIAGNOSIS — F331 Major depressive disorder, recurrent, moderate: Secondary | ICD-10-CM | POA: Insufficient documentation

## 2019-11-04 DIAGNOSIS — G63 Polyneuropathy in diseases classified elsewhere: Secondary | ICD-10-CM | POA: Insufficient documentation

## 2019-11-04 DIAGNOSIS — G620 Drug-induced polyneuropathy: Secondary | ICD-10-CM | POA: Insufficient documentation

## 2019-11-04 DIAGNOSIS — C801 Malignant (primary) neoplasm, unspecified: Secondary | ICD-10-CM | POA: Insufficient documentation

## 2019-11-05 ENCOUNTER — Other Ambulatory Visit: Payer: Self-pay | Admitting: *Deleted

## 2019-11-05 DIAGNOSIS — F4323 Adjustment disorder with mixed anxiety and depressed mood: Secondary | ICD-10-CM

## 2019-11-05 MED ORDER — PREGABALIN 200 MG PO CAPS: 200.0000 mg | ORAL_CAPSULE | Freq: Two times a day (BID) | ORAL | 0 refills | Status: DC

## 2019-11-08 ENCOUNTER — Encounter: Payer: Self-pay | Admitting: Psychology

## 2019-11-08 NOTE — Progress Notes (Signed)
Neuropsychology Visit  Patient:  [name]   DOB: 1960/06/17  MR Number: [id number]  Location: [facility] [place], [place] [id number] [place] [address] Dept: [phone]  Date of Service: 11/04/2019  Start: 8 AM End: 9 AM  Today's visit was a 1 hour in person visit that was conducted in my outpatient clinic office with myself and the patient present for this visit.  Duration of Service: 1 Hour  Provider/Observer:     Edgardo Roys PsyD  Chief Complaint:      Chief Complaint  Patient presents with  . Pain  . 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  The patient has had residual cognitive changes similar to possible impacts of chemotherapy as well as significant residual pain symptoms.  The patient was diagnosed and treated for cancer 10 years ago and continues to have ongoing follow-up regarding possible recurrence.  The patient has had neuropathy, insomnia and memory loss post chemotherapy.  The patient has depressive symptoms that have developed and has a hard time motivating and a hard time keeping focused on life and staying engaged.  Treatment Interventions:  Therapeutic interventions for issues associated with chronic pain, adjustment issues, depression and cognitive difficulties following a diagnosis and treatment for cancer which was primarily ovarian in nature.  Participation Level:   Active  Participation Quality:  Appropriate and Attentive      Behavioral Observation:  Well Groomed, Alert, and Appropriate.   Current Psychosocial Factors: The patient  continues to make gains as far as keeping up with day-to-day expectations and requirements.  She has been working on keeping up with her bills and working on active involvement instead of avoidance of stressful situations.  She continues to deal with depressive symptomatology.  Content of Session:   Reviewed current symptoms and worked on therapeutic interventions around issues of depression, cognitive difficulties and residual post chemotherapy.  Effectiveness of Interventions: The patient was very active and open during the visit we had an effective appointment.  Target Goals:   Target goals include working on building coping skills and strategies around issues of depression and residual effects of her chemotherapy including residual cognitive deficits.  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  The patient has had residual cognitive changes similar to possible impacts of chemotherapy as well as significant residual pain symptoms.  The patient was diagnosed and treated for cancer 10 years ago and continues to have ongoing follow-up regarding possible recurrence.  The patient has had neuropathy, insomnia and memory loss post chemotherapy.  The patient has depressive symptoms that have developed and has a hard time motivating and a hard time keeping focused on life and staying engaged.  The patient describes depressive symptoms, including motivation, feelings of helplessness and hopelessness and anxiety about recurrence of her cancer.  The patient also describes ongoing  chronic pain symptoms and neuropathy as well as insomnia and memory loss.  Today we worked specifically on behavioral  adaptations and building coping skills around issues of her difficulty completing necessary daily activities and daily requirements as her cognitive change in cognitive impairments and impact they have had on emotional status and self-esteem issues are having a very significant deleterious effect on her.  We have continue to work on therapeutic interventions around coping skills and strategies with regard to her depression and residual effects of her residual symptoms from chemotherapy.  Diagnosis:   Neuropathy associated with cancer [facility])  Neurocognitive deficits  Major depressive disorder, recurrent episode, moderate (HCC)    Ilean Skill, Psy.D. Clinical Psychologist Neuropsychologist

## 2019-11-10 ENCOUNTER — Ambulatory Visit: Payer: Medicare Other | Admitting: Psychology

## 2019-11-22 ENCOUNTER — Other Ambulatory Visit: Payer: Self-pay | Admitting: Neurology

## 2019-11-22 DIAGNOSIS — G4701 Insomnia due to medical condition: Secondary | ICD-10-CM

## 2019-11-22 DIAGNOSIS — H53143 Visual discomfort, bilateral: Secondary | ICD-10-CM

## 2019-11-22 DIAGNOSIS — G43109 Migraine with aura, not intractable, without status migrainosus: Secondary | ICD-10-CM

## 2019-11-22 DIAGNOSIS — H8113 Benign paroxysmal vertigo, bilateral: Secondary | ICD-10-CM

## 2019-11-22 MED ORDER — ZOLPIDEM TARTRATE 5 MG PO TABS: 5.0000 mg | ORAL_TABLET | Freq: Every evening | ORAL | 0 refills | Status: DC | PRN

## 2019-11-22 MED ORDER — DONEPEZIL HCL 10 MG PO TABS: 10.0000 mg | ORAL_TABLET | Freq: Every morning | ORAL | 2 refills | Status: DC

## 2019-11-24 ENCOUNTER — Other Ambulatory Visit: Payer: Self-pay | Admitting: Hematology & Oncology

## 2019-11-24 ENCOUNTER — Other Ambulatory Visit: Payer: Self-pay

## 2019-11-24 ENCOUNTER — Ambulatory Visit: Payer: Medicare Other | Admitting: Psychology

## 2019-11-24 DIAGNOSIS — F4323 Adjustment disorder with mixed anxiety and depressed mood: Secondary | ICD-10-CM

## 2019-11-24 DIAGNOSIS — G63 Polyneuropathy in diseases classified elsewhere: Secondary | ICD-10-CM

## 2019-11-24 MED ORDER — VITAMIN D (ERGOCALCIFEROL) 1.25 MG (50000 UNIT) PO CAPS: ORAL_CAPSULE | ORAL | 2 refills | Status: DC

## 2019-11-24 MED ORDER — DULOXETINE HCL 60 MG PO CPEP: 60.0000 mg | ORAL_CAPSULE | Freq: Every day | ORAL | 0 refills | Status: DC

## 2019-11-24 MED ORDER — PREGABALIN 200 MG PO CAPS: ORAL_CAPSULE | ORAL | 3 refills | Status: DC

## 2019-12-01 ENCOUNTER — Other Ambulatory Visit: Payer: Self-pay | Admitting: Internal Medicine

## 2019-12-02 ENCOUNTER — Inpatient Hospital Stay: Payer: Medicare Other | Attending: Hematology & Oncology

## 2019-12-07 ENCOUNTER — Ambulatory Visit: Payer: Medicare Other | Admitting: Psychology

## 2019-12-08 ENCOUNTER — Other Ambulatory Visit: Payer: Self-pay | Admitting: Internal Medicine

## 2019-12-09 NOTE — Telephone Encounter (Signed)
Ok to refill but needs OV before further refills

## 2019-12-16 ENCOUNTER — Other Ambulatory Visit: Payer: Self-pay

## 2019-12-16 ENCOUNTER — Encounter: Payer: Medicare Other | Attending: Psychology | Admitting: Psychology

## 2019-12-16 DIAGNOSIS — F331 Major depressive disorder, recurrent, moderate: Secondary | ICD-10-CM | POA: Insufficient documentation

## 2019-12-16 DIAGNOSIS — R29818 Other symptoms and signs involving the nervous system: Secondary | ICD-10-CM | POA: Insufficient documentation

## 2019-12-16 DIAGNOSIS — G63 Polyneuropathy in diseases classified elsewhere: Secondary | ICD-10-CM | POA: Insufficient documentation

## 2019-12-16 DIAGNOSIS — T451X5A Adverse effect of antineoplastic and immunosuppressive drugs, initial encounter: Secondary | ICD-10-CM | POA: Insufficient documentation

## 2019-12-16 DIAGNOSIS — C801 Malignant (primary) neoplasm, unspecified: Secondary | ICD-10-CM | POA: Insufficient documentation

## 2019-12-16 DIAGNOSIS — G620 Drug-induced polyneuropathy: Secondary | ICD-10-CM | POA: Diagnosis not present

## 2019-12-16 DIAGNOSIS — R4189 Other symptoms and signs involving cognitive functions and awareness: Secondary | ICD-10-CM | POA: Diagnosis present

## 2019-12-28 ENCOUNTER — Telehealth: Payer: Self-pay | Admitting: Hematology & Oncology

## 2019-12-28 NOTE — Telephone Encounter (Signed)
Faxed medical records to: T. [facility] @ [facility] P: [phone] [id number] F: [phone]

## 2019-12-29 ENCOUNTER — Ambulatory Visit: Payer: Medicare Other | Admitting: Psychology

## 2020-01-03 NOTE — Progress Notes (Signed)
Neuropsychology Visit  Patient:  [name]   DOB: 1960/08/25  MR Number: [id number]  Location: [facility] [place], [place] [id number] [place] [address] Dept: [phone]  Date of Service: 12/16/2019  Start: 11 AM End: 12 PM  Today's visit was a 1 hour in person visit that was conducted in my outpatient clinic office with myself and the patient present for this visit.  Duration of Service: 1 Hour  Provider/Observer:     Edgardo Roys PsyD  Chief Complaint:      Chief Complaint  Patient presents with  . Pain  . 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  The patient has had residual cognitive changes similar to possible impacts of chemotherapy as well as significant residual pain symptoms.  The patient was diagnosed and treated for cancer 10 years ago and continues to have ongoing follow-up regarding possible recurrence.  The patient has had neuropathy, insomnia and memory loss post chemotherapy.  The patient has depressive symptoms that have developed and has a hard time motivating and a hard time keeping focused on life and staying engaged.  Treatment Interventions:  Therapeutic interventions for issues associated with chronic pain, adjustment issues, depression and cognitive difficulties following a diagnosis and treatment for cancer which was primarily ovarian in nature.  Participation Level:   Active  Participation Quality:  Appropriate and Attentive      Behavioral Observation:  Well Groomed, Alert, and Appropriate.   Current Psychosocial Factors: The patient  reports that she has been doing slightly better with keeping up with day-to-day activities but continues to procrastinate until they become quite problematic for her.  This adds to the overall stress she is having.  Content of Session:   Reviewed current symptoms and worked on therapeutic interventions around issues of depression, cognitive difficulties and residual post chemotherapy.  Effectiveness of Interventions: The patient was very active and open during the visit we had an effective appointment.  Target Goals:   Target goals include working on building coping skills and strategies around issues of depression and residual effects of her chemotherapy including residual cognitive deficits.  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  The patient has had residual cognitive changes similar to possible impacts of chemotherapy as well as significant residual pain symptoms.  The patient was diagnosed and treated for cancer 10 years ago and continues to have ongoing follow-up regarding possible recurrence.  The patient has had neuropathy, insomnia and memory loss post chemotherapy.  The patient has depressive symptoms that have developed and has a hard time motivating and a hard time keeping focused on life and staying engaged.  The patient describes depressive symptoms, including motivation, feelings of helplessness and hopelessness and anxiety about recurrence of her cancer.  The patient also describes ongoing chronic pain symptoms and neuropathy as well as insomnia  and memory loss.  Today we worked specifically on behavioral adaptations and building coping skills around issues of her  difficulty completing necessary daily activities and daily requirements as her cognitive change in cognitive impairments and impact they have had on emotional status and self-esteem issues are having a very significant deleterious effect on her.  We have continue to work on therapeutic interventions around coping skills and strategies with regard to her depression and residual effects of her residual symptoms from chemotherapy.  Diagnosis:   Neuropathy associated with cancer [facility])  Neurocognitive deficits  Major depressive disorder, recurrent episode, moderate (HCC)    Ilean Skill, Psy.D. Clinical Psychologist Neuropsychologist

## 2020-01-04 ENCOUNTER — Ambulatory Visit: Payer: Medicare Other | Admitting: Psychology

## 2020-01-16 ENCOUNTER — Inpatient Hospital Stay
Admit: 2020-01-16 | Discharge: 2020-01-16 | Disposition: A | Discharge status: Home / Self Care | Payer: MEDICARE | Attending: Emergency Medicine

## 2020-01-16 DIAGNOSIS — S91311A Laceration without foreign body, right foot, initial encounter: Secondary | ICD-10-CM

## 2020-01-16 DIAGNOSIS — Z23 Encounter for immunization: Secondary | ICD-10-CM | POA: Diagnosis not present

## 2020-01-16 DIAGNOSIS — W228XXA Striking against or struck by other objects, initial encounter: Secondary | ICD-10-CM | POA: Diagnosis not present

## 2020-01-16 MED ORDER — DIPHTH,PERTUS(AC)TETANUS VAC(PF) 2.5 LF UNIT-8 MCG-5 LF/0.5 ML INJ
INTRAMUSCULAR | Status: AC
Start: 2020-01-16 — End: 2020-01-16
  Administered 2020-01-16: 18:00:00 via INTRAMUSCULAR

## 2020-01-16 MED ORDER — DOXYCYCLINE 100 MG CAP
100 mg | Freq: Two times a day (BID) | ORAL | Status: DC
Start: 2020-01-16 — End: 2020-01-16
  Administered 2020-01-16: 18:00:00 via ORAL

## 2020-01-16 MED ORDER — DOXYCYCLINE 100 MG CAP
100 mg | ORAL_CAPSULE | Freq: Two times a day (BID) | ORAL | 0 refills | Status: AC
Start: 2020-01-16 — End: 2020-01-21

## 2020-01-16 MED FILL — DOXYCYCLINE 100 MG CAP: 100 mg | ORAL | Qty: 1

## 2020-01-16 MED FILL — BOOSTRIX TDAP 2.5 LF UNIT-8 MCG-5 LF/0.5 ML INTRAMUSCULAR SUSPENSION: INTRAMUSCULAR | Qty: 0.5

## 2020-01-16 NOTE — ED Notes (Signed)
Foot bandaged with telfa and Kling following MD flush and suture. Tolerated well, Written and verbal discharge instructions reviewed with patient. All discharge medications reviewed and explained. Understanding verbalized, all questions answered. Ambulated out, gait steady.

## 2020-01-16 NOTE — ED Provider Notes (Signed)
ED Provider Notes by Julianne Handler., MD at 01/16/20 1330                Author: Julianne Handler., MD  Service: Emergency Medicine  Author Type: Physician       Filed: 01/16/20 1352  Date of Service: 01/16/20 1330  Status: Signed          Editor: Julianne Handler., MD (Physician)            Procedure Orders        1. Wound Repair [[id number]] ordered by Julianne Handler., MD                              [facility]   EMERGENCY DEPARTMENT HISTORY AND PHYSICAL EXAM               Date of Service: 01/16/2020    Patient Name: Tammy Duncan    Date of Birth: 08/16/1960   Medical Record Number: [id number]      History of Presenting Illness         Chief Complaint       Patient presents with        ?  Laceration            History Provided By:  patient      Additional History:    Tammy Duncan is a 60 y.o.  female who presents ambulatory to the ED with cc of laceration to the right foot, just behind the great toe, after stepping on a shell in [place]  water. Bleeding moderately on arrival. Thre are 2 lacerations, parallel to each other, totaling about 1.5" length.      There are no other complaints, changes or physical findings at this time.      Primary Care Provider: Madelin Headings, MD    Specialist:      Past History       Past Medical History:    History reviewed. No pertinent past medical history.       Past Surgical History:    No past surgical history on file.       Family History:    History reviewed. No pertinent family history.       Social History:      Social History          Tobacco Use         ?  Smoking status:  Not on file       Substance Use Topics         ?  Alcohol use:  Not on file         ?  Drug use:  Not on file            Allergies:    No Known Allergies       Review of Systems    Review of Systems    Constitutional: Negative for appetite change, chills and fever.    HENT: Negative for congestion.     Eyes: Negative for visual disturbance.    Respiratory:  Negative for cough, shortness of breath and wheezing.     Cardiovascular: Negative for chest pain, palpitations and leg swelling.    Gastrointestinal: Negative for abdominal pain.    Genitourinary: Negative for dysuria, frequency and urgency.    Musculoskeletal: Negative for back pain, joint swelling, myalgias and neck stiffness.  Skin: Positive for wound. Negative for rash.    Neurological: Negative for dizziness, syncope, weakness and headaches.    Hematological: Negative for adenopathy.    Psychiatric/Behavioral: Negative for behavioral problems and dysphoric mood.          Physical Exam   Physical Exam   Vitals and nursing note reviewed.   Constitutional:        General: She is not in acute distress.     Appearance: She is well-developed.    HENT:       Head: Normocephalic and atraumatic.   Eyes :       General: No scleral icterus.     Conjunctiva/sclera: Conjunctivae normal.      Pupils: Pupils are equal, round, and reactive to light.   Cardiovascular :       Rate and Rhythm: Normal rate and regular rhythm.      Heart sounds: No murmur heard.   No gallop.     Pulmonary:       Effort: Pulmonary effort is normal. No respiratory distress.      Breath sounds: No stridor. No wheezing or rales.   Abdominal:      General:  Bowel sounds are normal. There is no distension.      Palpations: Abdomen is soft. There is no mass.      Tenderness: There is no abdominal tenderness. There is no guarding or rebound.     Musculoskeletal:          General: Normal range of motion.      Cervical back: Normal range of motion and neck supple.     Lymphadenopathy:       Cervical: No cervical adenopathy.   Skin :      General: Skin is warm and dry.      Findings: No erythema or rash.      Comments: Right plantar foot: Parallel 3 cm lacerations ball of foot, no active bleeding    Neurological :       Mental Status: She is alert and oriented to person, place, and time.      Cranial Nerves: No cranial nerve deficit.      Coordination:  Coordination normal.            Medical Decision Making    I am the first provider for this patient.       I reviewed the vital signs, available nursing notes, past medical history, past surgical history, family history and social history.       Old Medical Records: none        ED Course:   1:30 PM    Initial assessment performed. The patients presenting problems have been discussed, and they are in agreement with the care plan formulated and outlined with them.  I have encouraged them to ask questions as they arise throughout their visit.      Progress Notes:   1:49 PM   Sutured. Will D/C on Doxy, sutures out 10 d. Td updated.   Julianne Handler., MD         Procedures:    Wound Repair      Date/Time: 01/16/2020 1:46 PM   Performed by: attendingPreparation: skin prepped with Betadine and sterile field established   Location details: right foot  Wound length:2.6 - 7.5 cm  Anesthesia:  local infiltration     Anesthesia:   Local Anesthetic: lidocaine 1% with epinephrine  Foreign bodies: no foreign bodies  Irrigation solution: saline  Irrigation  method: syringe  Debridement: none  Skin closure: 4-0 nylon  Number of sutures: 6  Technique: simple and interrupted  Approximation: close  Dressing: antibiotic ointment and gauze roll  My total time at bedside, performing  this procedure was 16-30 minutes.           Diagnostic Study Results    Labs -     No results found for this or any previous visit (from the past 12 hour(s)).      Radiologic Studies -   The following have been ordered and reviewed:     No orders to display          CT Results   (Last 48 hours)          None                 CXR Results   (Last 48 hours)          None                     Vital Signs-Reviewed the patient's vital signs.    Patient Vitals for the past 12 hrs:            Temp  Pulse  Resp  BP  SpO2            01/16/20 1329  98.5 ??F (36.9 ??C)  79  16  (!) 140/88  97 %           Medications Given in the ED:     Medications       doxycycline  (MONODOX) capsule 100 mg (has no administration in time range)       diph,Pertuss(AC),Tet Vac-PF (BOOSTRIX) suspension 0.5 mL (has no administration in time range)           Diagnosis:   Clinical Impression:       1.  Laceration of right foot, initial encounter             Plan:   1:      Follow-up Information               Follow up With  Specialties  Details  Why  Contact Info              Panosh, Neta Mends, MD  Internal Medicine, Pediatric Medicine  In 10 days  For suture removal  [name] [place] [address]   [phone]                   2:      Current Discharge Medication List              START taking these medications          Details        doxycycline (Monodox) 100 mg capsule  Take 1 Capsule by mouth two (2) times a day for 5 days.   Qty: 10 Capsule, Refills:  0   Start date: 01/16/2020, End date:  01/21/2020                      Return to ED if worse.       Disposition:   Home   _______________________________    Attestations:    This note was performed by Julianne Handler., MD  in its entirety.   _______________________________

## 2020-01-18 ENCOUNTER — Inpatient Hospital Stay
Admit: 2020-01-18 | Discharge: 2020-01-18 | Disposition: A | Discharge status: Home / Self Care | Payer: MEDICARE | Attending: Emergency Medicine

## 2020-01-18 ENCOUNTER — Emergency Department: Admit: 2020-01-18 | Payer: MEDICARE | Primary: Internal Medicine

## 2020-01-18 DIAGNOSIS — L03115 Cellulitis of right lower limb: Secondary | ICD-10-CM

## 2020-01-18 DIAGNOSIS — F17210 Nicotine dependence, cigarettes, uncomplicated: Secondary | ICD-10-CM | POA: Diagnosis not present

## 2020-01-18 DIAGNOSIS — R937 Abnormal findings on diagnostic imaging of other parts of musculoskeletal system: Secondary | ICD-10-CM | POA: Diagnosis not present

## 2020-01-18 LAB — METABOLIC PANEL, COMPREHENSIVE
A-G Ratio: 1 — ABNORMAL LOW (ref 1.1–2.2)
ALT (SGPT): 39 U/L (ref 12–78)
AST (SGOT): 25 U/L (ref 15–37)
Albumin: 3.7 g/dL (ref 3.5–5.0)
Alk. phosphatase: 105 U/L (ref 45–117)
Anion gap: 9 mmol/L (ref 5–15)
BUN/Creatinine ratio: 14 (ref 12–20)
BUN: 14 MG/DL (ref 6–20)
Bilirubin, total: 0.6 MG/DL (ref 0.2–1.0)
CO2: 27 mmol/L (ref 21–32)
Calcium: 9.9 MG/DL (ref 8.5–10.1)
Chloride: 103 mmol/L (ref 97–108)
Creatinine: 1 MG/DL (ref 0.55–1.02)
GFR est AA: >60 mL/min/{1.73_m2} (ref >60)
GFR est non-AA: 57 mL/min/{1.73_m2} — ABNORMAL LOW (ref >60)
Globulin: 3.6 g/dL (ref 2.0–4.0)
Glucose: 91 mg/dL (ref 65–100)
Potassium: 4.1 mmol/L (ref 3.5–5.1)
Protein, total: 7.3 g/dL (ref 6.4–8.2)
Sodium: 139 mmol/L (ref 136–145)

## 2020-01-18 LAB — CBC WITH AUTOMATED DIFF
ABS. BASOPHILS: 0 10*3/uL (ref 0.0–0.1)
ABS. EOSINOPHILS: 0.1 10*3/uL (ref 0.0–0.4)
ABS. IMM. GRANS.: 0 10*3/uL (ref 0.00–0.04)
ABS. LYMPHOCYTES: 1.6 10*3/uL (ref 0.8–3.5)
ABS. MONOCYTES: 0.6 10*3/uL (ref 0.0–1.0)
ABS. NEUTROPHILS: 5.7 10*3/uL (ref 1.8–8.0)
ABSOLUTE NRBC: 0 10*3/uL (ref 0.00–0.01)
BASOPHILS: 0 % (ref 0–1)
EOSINOPHILS: 1 % (ref 0–7)
HCT: 43.7 % (ref 35.0–47.0)
HGB: 14.9 g/dL (ref 11.5–16.0)
IMMATURE GRANULOCYTES: 0 % (ref 0.0–0.5)
LYMPHOCYTES: 20 % (ref 12–49)
MCH: 29.4 PG (ref 26.0–34.0)
MCHC: 34.1 g/dL (ref 30.0–36.5)
MCV: 86.2 FL (ref 80.0–99.0)
MONOCYTES: 7 % (ref 5–13)
MPV: 11.4 FL (ref 8.9–12.9)
NEUTROPHILS: 72 % (ref 32–75)
NRBC: 0 PER 100 WBC
PLATELET: 239 10*3/uL (ref 150–400)
RBC: 5.07 M/uL (ref 3.80–5.20)
RDW: 14.6 % — ABNORMAL HIGH (ref 11.5–14.5)
WBC: 8 10*3/uL (ref 3.6–11.0)

## 2020-01-18 LAB — CBC WITH AUTO DIFFERENTIAL
Basophils %: 0 % (ref 0–1)
Basophils Absolute: 0 10*3/uL (ref 0.0–0.1)
Eosinophils %: 1 % (ref 0–7)
Eosinophils Absolute: 0.1 10*3/uL (ref 0.0–0.4)
Granulocyte Absolute Count: 0 10*3/uL (ref 0.00–0.04)
Hematocrit: 43.7 % (ref 35.0–47.0)
Hemoglobin: 14.9 g/dL (ref 11.5–16.0)
Immature Granulocytes: 0 % (ref 0.0–0.5)
Lymphocytes %: 20 % (ref 12–49)
Lymphocytes Absolute: 1.6 10*3/uL (ref 0.8–3.5)
MCH: 29.4 PG (ref 26.0–34.0)
MCHC: 34.1 g/dL (ref 30.0–36.5)
MCV: 86.2 FL (ref 80.0–99.0)
MPV: 11.4 FL (ref 8.9–12.9)
Monocytes %: 7 % (ref 5–13)
Monocytes Absolute: 0.6 10*3/uL (ref 0.0–1.0)
NRBC Absolute: 0 10*3/uL (ref 0.00–0.01)
Neutrophils %: 72 % (ref 32–75)
Neutrophils Absolute: 5.7 10*3/uL (ref 1.8–8.0)
Nucleated RBCs: 0 PER 100 WBC
Platelets: 239 10*3/uL (ref 150–400)
RBC: 5.07 M/uL (ref 3.80–5.20)
RDW: 14.6 % — ABNORMAL HIGH (ref 11.5–14.5)
WBC: 8 10*3/uL (ref 3.6–11.0)

## 2020-01-18 LAB — COMPREHENSIVE METABOLIC PANEL
ALT: 39 U/L (ref 12–78)
AST: 25 U/L (ref 15–37)
Albumin/Globulin Ratio: 1 — ABNORMAL LOW (ref 1.1–2.2)
Albumin: 3.7 g/dL (ref 3.5–5.0)
Alkaline Phosphatase: 105 U/L (ref 45–117)
Anion Gap: 9 mmol/L (ref 5–15)
BUN: 14 MG/DL (ref 6–20)
Bun/Cre Ratio: 14 (ref 12–20)
CO2: 27 mmol/L (ref 21–32)
Calcium: 9.9 MG/DL (ref 8.5–10.1)
Chloride: 103 mmol/L (ref 97–108)
Creatinine: 1 MG/DL (ref 0.55–1.02)
EGFR IF NonAfrican American: 57 mL/min/{1.73_m2} — ABNORMAL LOW (ref >60)
GFR African American: >60 mL/min/{1.73_m2} (ref >60)
Globulin: 3.6 g/dL (ref 2.0–4.0)
Glucose: 91 mg/dL (ref 65–100)
Potassium: 4.1 mmol/L (ref 3.5–5.1)
Sodium: 139 mmol/L (ref 136–145)
Total Bilirubin: 0.6 MG/DL (ref 0.2–1.0)
Total Protein: 7.3 g/dL (ref 6.4–8.2)

## 2020-01-18 MED ORDER — CLINDAMYCIN 600 MG/50 ML IN 0.9% SODIUM CHLORIDE IV PIGGYBACK
600 mg/50 mL | INTRAVENOUS | Status: AC
Start: 2020-01-18 — End: 2020-01-18
  Administered 2020-01-18: 21:00:00 via INTRAVENOUS

## 2020-01-18 MED ORDER — CLINDAMYCIN 300 MG CAP
300 mg | ORAL_CAPSULE | Freq: Four times a day (QID) | ORAL | 0 refills | Status: AC
Start: 2020-01-18 — End: 2020-01-25

## 2020-01-18 MED FILL — CLINDAMYCIN 600 MG/50 ML IN 0.9% SODIUM CHLORIDE IV PIGGYBACK: 600 mg/50 mL | INTRAVENOUS | Qty: 50

## 2020-01-18 NOTE — ED Provider Notes (Signed)
ED Provider Notes by Morrie Sheldon, MD at 01/18/20 1853                Author: Morrie Sheldon, MD  Service: --  Author Type: Physician       Filed: 01/19/20 0809  Date of Service: 01/18/20 1853  Status: Signed          Editor: Morrie Sheldon, MD (Physician)               EMERGENCY DEPARTMENT HISTORY AND PHYSICAL EXAM               Date: 01/18/2020   Patient Name: Tammy Duncan        History of Presenting Illness          Chief Complaint       Patient presents with        ?  Skin Infection           History Provided By: Patient      HPI: Amiah Frohlich is a  59 y.o. female, pmhx listed below, who presents to the ED c/o foot redness.  Patient reports  she stepped on an oyster shell and had sutures placed, now notices foot redness.  Reports associated swelling.  No fever.  Patient was prescribed doxycycline which she is taken since discharge.            PCP: Madelin Headings, MD      There are no other complaints, changes, or physical findings at this time.               Past History           Past Medical History:   History reviewed. No pertinent past medical history.      Past Surgical History:   History reviewed. No pertinent surgical history.      Family History:   History reviewed. No pertinent family history.      Social History:     Social History          Tobacco Use         ?  Smoking status:  Current Every Day Smoker              Packs/day:  1.00         ?  Smokeless tobacco:  Never Used       Substance Use Topics         ?  Alcohol use:  Yes             Comment: occ         ?  Drug use:  Not Currently             Current Outpatient Medications          Medication  Sig  Dispense  Refill           ?  donepeziL (ARICEPT) 10 mg tablet  Take 10 mg by mouth.         ?  DULoxetine (CYMBALTA) 60 mg capsule  Take 60 mg by mouth daily.         ?  ergocalciferol (ERGOCALCIFEROL) 1,250 mcg (50,000 unit) capsule  TAKE 1 CAPSULE BY MOUTH 1 TIME A WEEK         ?  orphenadrine citrate (NORFLEX) 100  mg sr tablet  Take 100 mg by mouth two (2) times daily as needed.         ?  potassium  chloride (KLOR-CON) 10 mEq tablet  TAKE 3 TABLETS(30 MEQ) BY MOUTH THREE TIMES DAILY         ?  triamterene-hydroCHLOROthiazide (MAXZIDE) 37.5-25 mg per tablet  TAKE 2 TABLETS BY MOUTH DAILY         ?  simvastatin (ZOCOR) 40 mg tablet  TAKE 1 TABLET BY MOUTH DAILY         ?  clindamycin (CLEOCIN) 300 mg capsule  Take 1 Capsule by mouth four (4) times daily for 7 days.  28 Capsule  0     ?  pregabalin (LYRICA) 200 mg capsule           ?  zolpidem (AMBIEN) 5 mg tablet                 ?  doxycycline (Monodox) 100 mg capsule  Take 1 Capsule by mouth two (2) times a day for 5 days.  10 Capsule  0           Allergies:   No Known Allergies           Review of Systems     Review of Systems    Constitutional: Negative for chills and fever.    HENT: Negative for congestion.     Eyes: Negative for pain.    Respiratory: Negative for shortness of breath.     Cardiovascular: Negative for chest pain.    Gastrointestinal: Negative for abdominal pain.    Genitourinary: Negative for flank pain.    Musculoskeletal: Negative for back pain.    Neurological: Negative for headaches.    Psychiatric/Behavioral: Negative for agitation.            Physical Exam        Vital Signs-Reviewed the patient's vital signs.   No data found.      Physical Exam   Constitutional :        Appearance: Normal appearance.   HENT :       Head: Normocephalic and atraumatic.      Mouth/Throat:      Mouth: Mucous membranes are moist.    Eyes:       Pupils: Pupils are equal, round, and reactive to light.   Cardiovascular:       Rate and Rhythm: Normal rate and regular rhythm.   Pulmonary :       Effort: Pulmonary effort is normal.      Breath sounds: Normal breath sounds.   Abdominal :      Tenderness: There is no abdominal tenderness.     Musculoskeletal:      Comments: See photo below.  Erythema noted on dorsum of foot with associated warmth and swelling.  No purulence  from  wound.  Sutures clean and in place.  Normal DP pulses bilaterally    Skin:      General: Skin is warm and dry.   Neurological :       Mental Status: She is alert and oriented to person, place, and time.    Psychiatric:         Mood and Affect: Mood normal.                                  Diagnostic Study Results        Labs -    No results found for this or any previous visit (from the past 12 hour(s)).  Radiologic Studies -      XR FOOT RT MIN 3 V       Final Result     1. Few punctate nonspecific radiodensities in the medial soft tissues of the     base of the first proximal phalanx, which may represent foreign bodies. No acute     osseous abnormality.                      CT Results   (Last 48 hours)          None                 CXR Results   (Last 48 hours)          None                       Medical Decision Making     I am the first provider for this patient.      I reviewed the vital signs, available nursing notes, past medical history, past surgical history, family history and social history.      Records Reviewed: Nursing Notes and Old Medical Records      Provider Notes (Medical Decision Making):    MDM: 59 year old female with foot cellulitis following laceration. Patient appears nontoxic, cellulitis confined to dorsum of foot. Patient is currently taking doxycycline which would cover for vibrio  but would not cover for most strep that would potentially cause cellulitis. Will give dose of clindamycin now and check labs.      Initial assessment performed. The patients presenting problems have been discussed, and they are in agreement with the care plan formulated and outlined with them.  I have encouraged them to ask questions  as they arise throughout their visit.      PROGRESS NOTE:     WBC count okay. Patient tolerated clindamycin well. Will recommend adding oral antibiotic to cover for strep now and 24 to 48-hour observation  at home. Patient counseled to return for worsening redness, streaking,  fever.      Discharge note:   Pt re-evaluated, ready for discharge. Updated pt on all final results.  Will follow up as instructed. All questions have been answered, pt voiced understanding and agreement with plan. Specific return  precautions provided as well as instructions to return to the ED should sx worsen at any time. Vital signs stable for discharge.            Diagnosis        Clinical Impression:       1.  Cellulitis of right lower extremity                  Disposition:   Discharged        Discharge Medication List as of 01/18/2020  6:45 PM              START taking these medications          Details        clindamycin (CLEOCIN) 300 mg capsule  Take 1 Capsule by mouth four (4) times daily for 7 days., Normal, Disp-28 Capsule, R-0                     CONTINUE these medications which have NOT CHANGED          Details        donepeziL (ARICEPT) 10 mg tablet  Take 10  mg by mouth., Historical Med               DULoxetine (CYMBALTA) 60 mg capsule  Take 60 mg by mouth daily., Historical Med               ergocalciferol (ERGOCALCIFEROL) 1,250 mcg (50,000 unit) capsule  TAKE 1 CAPSULE BY MOUTH 1 TIME A WEEK, Historical Med               orphenadrine citrate (NORFLEX) 100 mg sr tablet  Take 100 mg by mouth two (2) times daily as needed., Historical Med               potassium chloride (KLOR-CON) 10 mEq tablet  TAKE 3 TABLETS(30 MEQ) BY MOUTH THREE TIMES DAILY, Historical Med               triamterene-hydroCHLOROthiazide (MAXZIDE) 37.5-25 mg per tablet  TAKE 2 TABLETS BY MOUTH DAILY, Historical Med               simvastatin (ZOCOR) 40 mg tablet  TAKE 1 TABLET BY MOUTH DAILY, Historical Med               pregabalin (LYRICA) 200 mg capsule  Historical Med               zolpidem (AMBIEN) 5 mg tablet  Historical Med               doxycycline (Monodox) 100 mg capsule  Take 1 Capsule by mouth two (2) times a day for 5 days., Print, Disp-10 Capsule, R-0                              Please note, this dictation was completed  with Dragon, the computer voice recognition software. Quite often unanticipated grammatical, syntax, homophones,  and other interpretive errors are inadvertently transcribed by the computer software. Please disregard these errors. Please excuse any errors that have escaped final proof reading.

## 2020-01-18 NOTE — ED Notes (Signed)
Right foot redness, swelling, and pain at site of sutures placed on Sunday; concerned about infection

## 2020-01-18 NOTE — ED Notes (Signed)
I have reviewed discharge instructions with the patient. The patient verbalized understanding. Discharge medications discussed with patient. No questions at this time. Ambulated without difficulty.

## 2020-01-24 ENCOUNTER — Other Ambulatory Visit: Payer: Self-pay | Admitting: Internal Medicine

## 2020-01-24 LAB — CULTURE, BLOOD
Culture result:: NO GROWTH
Culture result:: NO GROWTH

## 2020-01-24 LAB — CULTURE, BLOOD 1
Culture: NO GROWTH
Culture: NO GROWTH

## 2020-01-25 ENCOUNTER — Other Ambulatory Visit: Payer: Self-pay | Admitting: Internal Medicine

## 2020-01-27 ENCOUNTER — Inpatient Hospital Stay: Payer: Medicare Other | Attending: Hematology & Oncology

## 2020-01-27 DIAGNOSIS — Z859 Personal history of malignant neoplasm, unspecified: Secondary | ICD-10-CM | POA: Insufficient documentation

## 2020-01-27 DIAGNOSIS — Z452 Encounter for adjustment and management of vascular access device: Secondary | ICD-10-CM | POA: Insufficient documentation

## 2020-01-28 ENCOUNTER — Inpatient Hospital Stay: Payer: Medicare Other

## 2020-01-28 ENCOUNTER — Other Ambulatory Visit: Payer: Self-pay

## 2020-01-28 DIAGNOSIS — G63 Polyneuropathy in diseases classified elsewhere: Secondary | ICD-10-CM

## 2020-01-28 DIAGNOSIS — D500 Iron deficiency anemia secondary to blood loss (chronic): Secondary | ICD-10-CM

## 2020-01-28 DIAGNOSIS — C801 Malignant (primary) neoplasm, unspecified: Secondary | ICD-10-CM

## 2020-01-28 DIAGNOSIS — Z95828 Presence of other vascular implants and grafts: Secondary | ICD-10-CM

## 2020-01-28 DIAGNOSIS — Z859 Personal history of malignant neoplasm, unspecified: Secondary | ICD-10-CM | POA: Diagnosis not present

## 2020-01-28 DIAGNOSIS — Z452 Encounter for adjustment and management of vascular access device: Secondary | ICD-10-CM | POA: Diagnosis not present

## 2020-01-28 MED ORDER — HEPARIN SOD (PORK) LOCK FLUSH 100 UNIT/ML IV SOLN
500.0000 [IU] | Freq: Once | INTRAVENOUS | Status: AC | PRN
  Administered 2020-01-28: 500 [IU]
  Filled 2020-01-28: qty 5

## 2020-01-28 MED ORDER — SODIUM CHLORIDE 0.9% FLUSH
10.0000 mL | INTRAVENOUS | Status: DC | PRN
  Administered 2020-01-28: 10 mL
  Filled 2020-01-28: qty 10

## 2020-01-28 NOTE — Patient Instructions (Signed)

## 2020-01-31 ENCOUNTER — Other Ambulatory Visit: Payer: Self-pay

## 2020-01-31 ENCOUNTER — Ambulatory Visit (INDEPENDENT_AMBULATORY_CARE_PROVIDER_SITE_OTHER): Payer: Medicare Other | Admitting: Internal Medicine

## 2020-01-31 ENCOUNTER — Encounter: Payer: Self-pay | Admitting: Internal Medicine

## 2020-01-31 VITALS — BP 124/84 | HR 103 | Temp 98.2°F | Ht 68.0 in | Wt 222.2 lb

## 2020-01-31 DIAGNOSIS — R238 Other skin changes: Secondary | ICD-10-CM | POA: Diagnosis not present

## 2020-01-31 DIAGNOSIS — H53412 Scotoma involving central area, left eye: Secondary | ICD-10-CM | POA: Diagnosis not present

## 2020-01-31 DIAGNOSIS — T451X5A Adverse effect of antineoplastic and immunosuppressive drugs, initial encounter: Secondary | ICD-10-CM

## 2020-01-31 DIAGNOSIS — Z4802 Encounter for removal of sutures: Secondary | ICD-10-CM | POA: Diagnosis not present

## 2020-01-31 DIAGNOSIS — L989 Disorder of the skin and subcutaneous tissue, unspecified: Secondary | ICD-10-CM

## 2020-01-31 DIAGNOSIS — G620 Drug-induced polyneuropathy: Secondary | ICD-10-CM

## 2020-01-31 NOTE — Patient Instructions (Signed)
Will do referral to derm  Skin surgery  Also ophthalmology .   Use water shoes.

## 2020-01-31 NOTE — Progress Notes (Signed)
Chief Complaint  Patient presents with  . Suture / Staple Removal    right foot, stitch removal, stepped on shell in [place] and cut foot on shells    HPI: [name] 59 y.o. come in for couple of issues    !. 6 sutures  On Sept 5 at [facility]  Then got infected ? Given cleocin for 7 days   Now better  But sore and needs sutures removed .  2. Bumps appearing  Over time  Feet right ankle and are buttocks area .  Some scale and larger  Remote  Hx of scca removed a number of  Years ago  surgery center  #. Has had 4 days of new  Dark whole in left vision  callled  Eye doc and said appt in  2 weeks and can see optician?  No assoc sx     Hx of  [facility] surgery  Asks for referral  ROS: See pertinent positives and negatives per HPI. No fever new neuro sx   Past Medical History:  Diagnosis Date  . 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 Adjustment disorder   . Allergic rhinitis   . Alopecia   . Anxiety and depression   . 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 HLD (hyperlipidemia)   . Hx pulmonary embolism   . Hypertension   . Iron deficiency anemia due to chronic blood loss 11/04/2016  . Metrorrhagia   . Neuropathy   . Obesity   . Ovarian cancer ([place]) 04/2008   ChemoTherapy  . 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 Raynaud's syndrome   . Right groin mass   . Squamous cell carcinoma of skin   . Vision abnormalities   . Vitamin D deficiency     Family History  Problem Relation Age of Onset  . Pulmonary embolism Mother   . Hypertension Father   . Breast cancer Maternal Grandmother        diagnosed in early 38s  . Hyperlipidemia Other   . Breast cancer Sister 38    Social History   Socioeconomic History  . Marital status: Divorced    Spouse name: Not on file  . Number of children: 1  . Years of education: Master's  . Highest education level: Not on file  Occupational History    Employer: UNEMPLOYED    Comment: disabled    Tobacco Use  . Smoking status: Current Every Day Smoker    Packs/day: 1.00    Years: 37.00    Pack years: 37.00    Types: Cigarettes    Start date: 06/16/1978  . Smokeless tobacco: Never Used  . Tobacco comment: 2- 7-16  STILL SMOKING  Vaping Use  . Vaping Use: Never used  Substance and Sexual Activity  . Alcohol use: Yes    Alcohol/week: 0.0 standard drinks    Comment: rare, 3 weekly  . Drug use: No  . Sexual activity: Not on file  Other Topics Concern  . Not on file  Social History Narrative   Divorced   1 son /1 adopted adult son  2 dogs    Sales occupation on disability for now   Regular exercise-yes money stress   Daily caffeine use 2-3 and 2 cups of coffee   On since a security disability since 2012   Patient is right-handed.   Patient has a Master's degree.   Social Determinants of Health   Financial Resource Strain:   .  Difficulty of Paying Living Expenses: Not on file  Food Insecurity:   . Worried About Charity fundraiser in the Last Year: Not on file  . Ran Out of Food in the Last Year: Not on file  Transportation Needs:   . Lack of Transportation (Medical): Not on file  . Lack of Transportation (Non-Medical): Not on file  Physical Activity:   . Days of Exercise per Week: Not on file  . Minutes of Exercise per Session: Not on file  Stress:   . Feeling of Stress : Not on file  Social Connections:   . Frequency of Communication with Friends and Family: Not on file  . Frequency of Social Gatherings with Friends and Family: Not on file  . Attends Religious Services: Not on file  . Active Member of Clubs or Organizations: Not on file  . Attends Archivist Meetings: Not on file  . Marital Status: Not on file    Outpatient Medications Prior to Visit  Medication Sig Dispense Refill  . aspirin 81 MG tablet Take 81 mg by mouth daily. 2 TABS IN AM    . butalbital-acetaminophen-caffeine (FIORICET, ESGIC) 50-325-40 MG tablet TAKE 1 TABLET BY MOUTH EVERY  6 HOURS AS NEEDED FOR HEADACHE 10 tablet 3  . calcium carbonate (TUMS - DOSED IN MG ELEMENTAL CALCIUM) 500 MG chewable tablet Chew 1 tablet by mouth daily as needed for indigestion or heartburn.    . diclofenac (VOLTAREN) 75 MG EC tablet TK 1 T PO BID AFTER MEALS FOR INFLAMMATION / PAIN / SWELLING PRN ONLY  1  . donepezil (ARICEPT) 10 MG tablet Take 1 tablet (10 mg total) by mouth every morning. 90 tablet 2  . DULoxetine (CYMBALTA) 60 MG capsule Take 1 capsule (60 mg total) by mouth daily. 90 capsule 0  . Multiple Vitamin (MULTIVITAMIN WITH MINERALS) TABS tablet Take 1 tablet by mouth daily.    . orphenadrine (NORFLEX) 100 MG tablet Take 1 tablet (100 mg total) by mouth 2 (two) times daily as needed for muscle spasms. Schedule follow up visit for more refills. [phone] 180 tablet 0  . potassium chloride (KLOR-CON) 10 MEQ tablet Take 3 tablets (30 mEq total) by mouth 3 (three) times daily. Please schedule yearly visit for refills. [phone] 270 tablet 0  . pregabalin (LYRICA) 200 MG capsule TAKE 1 CAPSULE(200 MG) BY MOUTH TWICE DAILY 60 capsule 3  . simvastatin (ZOCOR) 40 MG tablet TAKE 1 TABLET BY MOUTH DAILY 90 tablet 0  . triamterene-hydrochlorothiazide (MAXZIDE-25) 37.5-25 MG tablet TAKE 2 TABLETS BY MOUTH DAILY 180 tablet 0  . Vitamin D, Ergocalciferol, (DRISDOL) 1.25 MG (50000 UNIT) CAPS capsule TAKE 1 CAPSULE BY MOUTH 1 TIME A WEEK 13 capsule 2  . zolpidem (AMBIEN) 5 MG tablet Take 1-2 tablets (5-10 mg total) by mouth at bedtime as needed. 180 tablet 0  . sucralfate (CARAFATE) 1 g tablet Take 1 tablet (1 g total) by mouth 4 (four) times daily -  with meals and at bedtime. May dissolve/crush in 15 cc water (Patient not taking: Reported on 08/05/2019) 120 tablet 11   No facility-administered medications prior to visit.     EXAM:  BP 124/84   Pulse (!) 103   Temp 98.2 F (36.8 C) (Oral)   Ht _0  (1.727 m)   Wt 222 lb 3.2 oz (100.8 kg)   SpO2 98%   BMI 33.79 kg/m   Body mass  index is 33.79 kg/m.  GENERAL: vitals reviewed and listed above,  alert, oriented, appears well hydrated and in no acute distress HEENT: atraumatic, conjunctiva  clear, no obvious abnormalities on inspection of external nose and ears OP : masked  NECK: no obvious masses on inspection palpation  LUNGS: clear to auscultation bilaterally, no wheezes, rales or rhonchi, good air movement CV: HRRR, no clubbing cyanosis or  peripheral edema nl cap refill  MS: moves all extremities  Right foot plantar surface mtp and great toe    6 sutures removed without difficulty  Sore but no redness or dc   No infection obvious Skin:  6 mm dark scaly papule left ankle   Other smaller   Papules  Right foot and now left foot   Buttocks 2 papules  No redness or scaling  PSYCH: pleasant and cooperative, no obvious depression or anxiety  BP Readings from Last 3 Encounters:  01/31/20 124/84  08/05/19 112/80  03/09/19 108/75    ASSESSMENT AND PLAN:  Discussed the following assessment and plan:  Visit for suture removal - placed elsewhere  15 days  sp secondary infection marine  fresh water   Skin lesions - Plan: Ambulatory referral to Dermatology  Papules - numbers and spreading  le  one area is  darker and scalier  by require bcx or other  refer for eval - Plan: Ambulatory referral to Dermatology  Vision loss, central, left ? dark spot in field  of vision - will refer  - Plan: Ambulatory referral to Ophthalmology  Neuropathy due to chemotherapeutic drug [facility])  -Patient advised to return or notify health care team  if  new concerns arise.  Patient Instructions  Will do referral to derm  Skin surgery  Also ophthalmology .   Use water shoes.    Standley Brooking. Shade Rivenbark M.D.

## 2020-02-01 ENCOUNTER — Other Ambulatory Visit: Payer: Self-pay

## 2020-02-01 ENCOUNTER — Encounter: Payer: Self-pay | Admitting: Psychology

## 2020-02-01 ENCOUNTER — Encounter: Payer: Medicare Other | Attending: Psychology | Admitting: Psychology

## 2020-02-01 DIAGNOSIS — G63 Polyneuropathy in diseases classified elsewhere: Secondary | ICD-10-CM | POA: Diagnosis not present

## 2020-02-01 DIAGNOSIS — R29818 Other symptoms and signs involving the nervous system: Secondary | ICD-10-CM | POA: Diagnosis not present

## 2020-02-01 DIAGNOSIS — T451X5A Adverse effect of antineoplastic and immunosuppressive drugs, initial encounter: Secondary | ICD-10-CM | POA: Diagnosis not present

## 2020-02-01 DIAGNOSIS — F331 Major depressive disorder, recurrent, moderate: Secondary | ICD-10-CM | POA: Insufficient documentation

## 2020-02-01 DIAGNOSIS — H5213 Myopia, bilateral: Secondary | ICD-10-CM | POA: Diagnosis not present

## 2020-02-01 DIAGNOSIS — R4189 Other symptoms and signs involving cognitive functions and awareness: Secondary | ICD-10-CM | POA: Insufficient documentation

## 2020-02-01 DIAGNOSIS — H31002 Unspecified chorioretinal scars, left eye: Secondary | ICD-10-CM | POA: Diagnosis not present

## 2020-02-01 DIAGNOSIS — G620 Drug-induced polyneuropathy: Secondary | ICD-10-CM | POA: Diagnosis not present

## 2020-02-01 DIAGNOSIS — C801 Malignant (primary) neoplasm, unspecified: Secondary | ICD-10-CM | POA: Diagnosis not present

## 2020-02-01 NOTE — Progress Notes (Signed)
Neuropsychology Visit  Patient:  [name]   DOB: 01-18-1961  MR Number: [id number]  Location: [facility] [place], [place] [id number] [place] [address] Dept: [phone]  Date of Service: 02/01/2020  Start: 10 AM End: 11 AM  Today's visit was a 1 hour in person visit that was conducted in my outpatient clinic office with myself and the patient present for this visit.  Duration of Service: 1 Hour  Provider/Observer:     Edgardo Roys PsyD  Chief Complaint:      Chief Complaint  Patient presents with  . Depression  . 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  The patient has had residual cognitive changes similar to possible impacts of chemotherapy as well as significant residual pain symptoms.  The patient was diagnosed and treated for cancer 10 years ago and continues to have ongoing follow-up regarding possible recurrence.  The patient has had neuropathy, insomnia and memory loss post chemotherapy.  The patient has depressive symptoms that have developed and has a hard time motivating and a hard time keeping focused on life and staying engaged.  The patient is continued to struggle with issues related to coping and adjustment issues/depression following her treatment of cancer and residual cognitive deficits that while improving continue to be problematic.  Neuropathy, insomnia and memory loss continue to be issues for the patient to cope with  Treatment Interventions:  Therapeutic interventions for issues associated with chronic pain, adjustment issues, depression and  cognitive difficulties following a diagnosis and treatment for cancer which was primarily ovarian in nature.  Participation Level:   Active  Participation Quality:  Appropriate and Attentive      Behavioral Observation:  Well Groomed, Alert, and Appropriate.   Current Psychosocial Factors: The patient reports that she has been doing slightly better with keeping up with day-to-day activities but continues to procrastinate until they become quite problematic for her.  This adds to the overall stress she is having.  The patient reports that she has been able to plan more activities including planning on volunteering to help with some of the [place] refugee population.  Content of Session:   Reviewed current symptoms and worked on therapeutic interventions around issues of depression, cognitive difficulties and residual post chemotherapy.  Effectiveness of Interventions: The patient was very active and open during the visit we had an effective appointment.  Target Goals:   Target goals include working on building coping skills and strategies around issues of depression and residual effects of her chemotherapy including residual cognitive deficits.  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  The patient has had residual cognitive changes similar to possible impacts of chemotherapy as well as significant residual pain symptoms.  The patient was diagnosed and treated for cancer 10 years ago and continues to have ongoing follow-up regarding possible recurrence.  The patient has had neuropathy, insomnia and memory loss post chemotherapy.  The patient has depressive symptoms that have  developed and has a hard time motivating and a hard time keeping focused on life and staying engaged.  The patient describes depressive symptoms, including motivation, feelings of helplessness and hopelessness and anxiety about recurrence of her cancer.  The patient also describes ongoing chronic pain symptoms and neuropathy as well as insomnia and memory loss.  Today we worked specifically on behavioral adaptations and building coping skills around issues of her difficulty completing necessary daily activities and daily requirements as her cognitive change in cognitive impairments and impact they have had on emotional status and self-esteem issues are having a very significant deleterious effect on her.  We have continue to work on therapeutic interventions around coping skills and strategies with regard to her depression and residual effects of her residual symptoms from chemotherapy.  Diagnosis:   Neuropathy associated with cancer [facility])  Neurocognitive deficits  Major depressive disorder, recurrent episode, moderate (HCC)  Cognitive impairment    Ilean Skill, Psy.D. Clinical Psychologist Neuropsychologist

## 2020-02-09 ENCOUNTER — Other Ambulatory Visit: Payer: Self-pay

## 2020-02-09 ENCOUNTER — Encounter: Payer: Self-pay | Admitting: Neurology

## 2020-02-09 ENCOUNTER — Ambulatory Visit: Payer: Medicare Other | Admitting: Neurology

## 2020-02-09 DIAGNOSIS — H53143 Visual discomfort, bilateral: Secondary | ICD-10-CM | POA: Diagnosis not present

## 2020-02-09 DIAGNOSIS — G43109 Migraine with aura, not intractable, without status migrainosus: Secondary | ICD-10-CM | POA: Diagnosis not present

## 2020-02-09 DIAGNOSIS — H8113 Benign paroxysmal vertigo, bilateral: Secondary | ICD-10-CM | POA: Diagnosis not present

## 2020-02-09 DIAGNOSIS — G4701 Insomnia due to medical condition: Secondary | ICD-10-CM | POA: Diagnosis not present

## 2020-02-09 MED ORDER — ZOLPIDEM TARTRATE 5 MG PO TABS: 5.0000 mg | ORAL_TABLET | Freq: Every evening | ORAL | 0 refills | Status: DC | PRN

## 2020-02-09 MED ORDER — DONEPEZIL HCL 10 MG PO TABS: 10.0000 mg | ORAL_TABLET | Freq: Every morning | ORAL | 2 refills | Status: DC

## 2020-02-09 MED ORDER — AMPHETAMINE-DEXTROAMPHET ER 10 MG PO CP24: ORAL_CAPSULE | ORAL | 0 refills | Status: DC

## 2020-02-09 NOTE — Progress Notes (Signed)
PATIENT: [name] DOB: January 18, 1961  REASON FOR VISIT: follow up- neuropathy, cognitive changes, insomnia HISTORY FROM: patient  HISTORY OF PRESENT ILLNESS: 02-09-2020: RN notes-rm 10. presents for follow up visit. pt states that he has had a pinching sensation in back area which started 2 weeks ago and its intermittent. she has been seeing Dr Sima Matas and feels that has been helping. pt said she had some skin concerns for  which she is seeing specialist. She developed a dark spot in her left eye saw her ophthalmologist, a laser procedure is planned for 10/4.  CD 02-09-2020. I have the pleasure meeting again today again Ms. Chia Saxon- Mays , a multiracial femele patient, who was last seen by our nurse practitioner Amy Lomax.  The patient underwent neuropsychological testing and the last date of service had been 9-21 2021.  This was a follow-up with Dr. Ilean Skill whom she had seen a year earlier for baseline neuropsychological evaluation.  She has ongoing adjustment issues, cognitive changes following her treatment for cancer.  She had some residual cognitive changes that were attributed to chemotherapy effects.  She also has a neuropathy that is likely related to either the primary malignancy or the treatment thereof.  Dr. 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  The diagnosis of neurocognitive deficits had been made a year earlier. She has been reporting ongoing hypersomnia- Epworth sleepiness score 15/ 14 points, she has better sleep quality at night.  Bouts of insomnia but much less long and frequent.  She attributed this to Dr Gaspar Cola behavior therapy, improving also anxiety and  depression. Depression manifesting I feeling overwhelmed, feeling paralyzed by the overwhelmed feeling. " get no- thing done ". Continues to take antidepressant medication. Lack of energy: " tired every day and then not sleeping at night "      [facility] Cognitive Assessment  02/09/2020 11/25/2018 05/26/2018 05/22/2017 06/05/2016  Visuospatial/ Executive (0/5) 5 3 4 3 4   Naming (0/3) 3 3 3 3 3   Attention: Read list of digits (0/2) 1 0 1 0 1  Attention: Read list of letters (0/1) 1 1 1 1 1   Attention: Serial 7 subtraction starting at 100 (0/3) 2 3 3 3 3   Language: Repeat phrase (0/2) 2 1 2 2 2   Language : Fluency (0/1) 1 1 1 1 1   Abstraction (0/2) 2 2 2 2 2   Delayed Recall (0/5) 3 3 3 3 2   Orientation (0/6) 5 5 5 5 6   Total 25 22 25 23 25   Adjusted Score (based on education) - - - - 25        Interval history 11-25-2018, I have the pleasure of seeing [name] again, a 59 year old woman of african and caucasian mixed race who reports less trouble sleeping but trouble with memory, progressive.  She has neuropathy, had migraine, is at high risk of falling. 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 She has had spells that were syncopal, and some looking like seizures.  She reports feeling isolated during the corona pandemic, has seen neither children , grandchild, nor  her parents.she is tearful , appears very depressed. She takes Cymbalta for  mood.         2016 : [name] is a 59 year old female with a history of neuropathy,cognitive changes and insomnia. She returns today for follow-up. The patient continues to take Ambien for insomnia. The patient states that if she does not take this medication she will not be able to go to sleep. The patient currently takes Lyrica 150 mg twice a day for neuropathy. Patient states that the burning and tingling is located in the toes extending to the mid foot bilaterally. She also has burning and  tingling in the fingers that extends to the mid hand. Denies any changes with her gait or balance. That she had a fall while standing and watching fireworks. She states that the right leg went numb and cause her to fall. She states that this is the first time this has happened. She states that the numbness resolved. He states that she is not had any additional episodes of numbness. Patient feels that her memory has remained same. She states that she is able to do simple tasks but things that require more complex thinking she has difficulty with. For example she has trouble following plots in a TV show. She states that she has directions for how to make coffee posted. She is able to complete all ADLs independently. She operates a Teacher, music without difficulty. She states that occasionally she'll take a wrong turn to a familiar place. She continues to take Aricept 10 mg daily. Patient states that she did have a sleep study area and she is unsure if she should be wearing oxygen at bedtime. She states that she had a dental procedure and the technician noted that her oxygen saturation dropped while she was sleeping. She denies any new neurological issues. She returns today for an evaluation.      HISTORY 05/18/14 (CD): [name] is here today for her yearly revisit she is doing remarkably well knowing about her history of metastatic cancer with unknown primary to more she seems to have battled her disease very well.  She has lost some weight since her last visit, her blood pressure is well controlled. The HST study performed on 10-23-13 showed obstructive sleep apnea not to be present, but some hypoxemia at night there were 109 minutes of oxygenation at 88% or less. Since this test was a home sleep test there is a higher likelihood of artifact. The patient does 1-2 times a night feel air hungry. She does wake occasionally up with a feeling that she doesn't breath enough. And her heart will be  racing- palpitations have also not been seen in her home sleep test. She described sudden falls still being a concern, neuropathy contributing to this.  her cognition is improved, by her own subjective assessment as well as MOCA test today : scored 26 out of 30 points. This is a normal range for her cognitive function. Ambien dependent , unable to go to sleep without sleep aid for several years now.  Her list of medication was reviewed and there have been no recent changes. Her dog is her main social contact , besides her church family.   Interval history from 11/28/2015. [name] is here today and did excellent on today's memory testing. There is a variability between 26 and 22 points over the last 18 months. She mastered the trail making test, she recalled 3 out of 5 recall words. The patient had been advised in my last visit with her to avoid driving at night, she lives  alone and she relies on her own transportation. She has not gotten lost. She has  an exercise regimen and she has implemented dietary changes. She is an excellent mood today and would like to reduce her Ambien from 12 point 5 at night to 10 mg which makes me very happy. I would like for her to continue with the Aricept I will follow her every 6 months.  Interval history from 06/05/2016, [name] at the meanwhile 59 year old woman with a long-standing history for ill-defined consult. She can continue to drive without difficulties and is independent in activities of daily living she reports rhinitis but I attributed to Aricept, falls that seem to be neither syncopal, nor seizure, no mechanical in origin. Her falls are always propulsive, she falls forward she has very little warning or numb and she finds herself dominant how she got to the floor. Her mini entall test score 25 out of 30 points today which is stable and actually improved to a year ago. She is taking Lyrica for neuropathy and Lyrica is also a seizure  medicine, I have wondered if Aricept may give her bradycardia but her heart rate today was 92.  Interval history from the tenth of January 2019, I have the pleasure of meeting today with [name], I follow her yearly and we usually look at her degree of fatigue, gait stability and cognitive status.  She scored today on a [place] cognitive assessment 23 out of 30 points and was last year 25 out of 30 points.  She missed 2 of the 5 recall words.  And the day of the week. She had another fall in October 2018 which she considered bad.  She describes that her legs entangled for some reason she fell onto her right hip she did not suffer fractures just a hematoma.  This happened on a non-slip even floor and she is still not sure what happened. No loss of conscience. Another fall at her sons house on the day before New years. "I sometimes cannot feel my feet and they end up entangled or in the wrong place at the wrong time.  This time I fell and slipped over a coffee table.  05-26-2018.RV , yearly for disability forms.  Mrs. Zebedee Iba- Freddi Che has become a grandmother 34 month ago and is very happy, son and his family live in [place]. She has survived an unknown primary cancer by now for many years. Still having neuropathy - as expected, and memory problems.   REVIEW OF SYSTEMS: Out of a complete 14 system review of symptoms, the patient complains only of the following symptoms, and all other reviewed systems are negative.  Fatigue and sleepiness- declined stimulant offer.   Memory is variable, easily fatigued, easily distracted, depressed. Limited attention. She has stabile points on MOCA for about 3 years now !   Foot numbness  and pain, progressive - On 200 Lyrica - but still high fall risk. Chemotherapy induced  Heat intolerance, excessive sweating, insomnia, daytime sleepiness, joint pain, memory loss, numbness due to neuropathy= Chemotherapy induced, para-neoplastic induced ? Marland Kitchen   How likely are  you to doze in the following situations: 0 = not likely, 1 = slight chance, 2 = moderate chance, 3 = high chance  Sitting and Reading? Watching Television? Sitting inactive in a public place (theater or meeting)? Lying down in the afternoon when circumstances permit? Sitting and talking to someone? Sitting quietly after lunch without alcohol? In a car, while stopped for a few minutes in  traffic? As a passenger in a car for an hour without a break?  Total = 15   FSS at 50/ 63 points- very high !!    ALLERGIES: Allergies  Allergen Reactions  . Codeine Nausea Only  . Orange Itching and Rash    HOME MEDICATIONS: Outpatient Medications Prior to Visit  Medication Sig Dispense Refill  . aspirin 81 MG tablet Take 81 mg by mouth daily. 2 TABS IN AM    . butalbital-acetaminophen-caffeine (FIORICET, ESGIC) 50-325-40 MG tablet TAKE 1 TABLET BY MOUTH EVERY 6 HOURS AS NEEDED FOR HEADACHE 10 tablet 3  . calcium carbonate (TUMS - DOSED IN MG ELEMENTAL CALCIUM) 500 MG chewable tablet Chew 1 tablet by mouth daily as needed for indigestion or heartburn.    . diclofenac (VOLTAREN) 75 MG EC tablet TK 1 T PO BID AFTER MEALS FOR INFLAMMATION / PAIN / SWELLING PRN ONLY  1  . donepezil (ARICEPT) 10 MG tablet Take 1 tablet (10 mg total) by mouth every morning. 90 tablet 2  . DULoxetine (CYMBALTA) 60 MG capsule Take 1 capsule (60 mg total) by mouth daily. 90 capsule 0  . Multiple Vitamin (MULTIVITAMIN WITH MINERALS) TABS tablet Take 1 tablet by mouth daily.    . orphenadrine (NORFLEX) 100 MG tablet Take 1 tablet (100 mg total) by mouth 2 (two) times daily as needed for muscle spasms. Schedule follow up visit for more refills. [phone] 180 tablet 0  . potassium chloride (KLOR-CON) 10 MEQ tablet Take 3 tablets (30 mEq total) by mouth 3 (three) times daily. Please schedule yearly visit for refills. [phone] 270 tablet 0  . pregabalin (LYRICA) 200 MG capsule TAKE 1 CAPSULE(200 MG) BY MOUTH TWICE DAILY  60 capsule 3  . simvastatin (ZOCOR) 40 MG tablet TAKE 1 TABLET BY MOUTH DAILY 90 tablet 0  . triamterene-hydrochlorothiazide (MAXZIDE-25) 37.5-25 MG tablet TAKE 2 TABLETS BY MOUTH DAILY 180 tablet 0  . Vitamin D, Ergocalciferol, (DRISDOL) 1.25 MG (50000 UNIT) CAPS capsule TAKE 1 CAPSULE BY MOUTH 1 TIME A WEEK 13 capsule 2  . zolpidem (AMBIEN) 5 MG tablet Take 1-2 tablets (5-10 mg total) by mouth at bedtime as needed. 180 tablet 0  . sucralfate (CARAFATE) 1 g tablet Take 1 tablet (1 g total) by mouth 4 (four) times daily -  with meals and at bedtime. May dissolve/crush in 15 cc water 120 tablet 11   No facility-administered medications prior to visit.    PAST MEDICAL HISTORY: Past Medical History:  Diagnosis Date  . 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 Adjustment disorder   . Allergic rhinitis   . Alopecia   . Anxiety and depression   . 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 HLD (hyperlipidemia)   . Hx pulmonary embolism   . Hypertension   . Iron deficiency anemia due to chronic blood loss 11/04/2016  . Metrorrhagia   . Neuropathy   . Obesity   . Ovarian cancer ([place]) 04/2008   ChemoTherapy  . 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 Raynaud's syndrome   . Right groin mass   . Squamous cell carcinoma of skin   . Vision abnormalities   . Vitamin D deficiency     PAST SURGICAL HISTORY: Past Surgical History:  Procedure Laterality Date  . ABDOMINAL HYSTERECTOMY  05/2008   TAH/BSO  . AUGMENTATION MAMMAPLASTY Bilateral 10+ years  . BREAST BIOPSY Left 2020  . BREAST BIOPSY Right   . BREAST REDUCTION SURGERY  2000  .  CESAREAN SECTION     1991  . COLONOSCOPY  2004; 12/07/10   hemorrhoids  . ESOPHAGOGASTRODUODENOSCOPY    . GASTRIC BYPASS  1979  . REDUCTION MAMMAPLASTY Bilateral   . TOENAIL EXCISION Bilateral   . TONSILLECTOMY      FAMILY HISTORY: Family History  Problem Relation Age of Onset  . Pulmonary embolism Mother   . Hypertension  Father   . Breast cancer Maternal Grandmother        diagnosed in early 59s  . Hyperlipidemia Other   . Breast cancer Sister 63    SOCIAL HISTORY: Social History   Socioeconomic History  . Marital status: Divorced    Spouse name: Not on file  . Number of children: 1  . Years of education: Master's  . Highest education level: Not on file  Occupational History    Employer: UNEMPLOYED    Comment: disabled  Tobacco Use  . Smoking status: Current Every Day Smoker    Packs/day: 1.00    Years: 37.00    Pack years: 37.00    Types: Cigarettes    Start date: 06/16/1978  . Smokeless tobacco: Never Used  . Tobacco comment: 2- 7-16  STILL SMOKING  Vaping Use  . Vaping Use: Never used  Substance and Sexual Activity  . Alcohol use: Yes    Alcohol/week: 0.0 standard drinks    Comment: rare, 3 weekly  . Drug use: No  . Sexual activity: Not on file  Other Topics Concern  . Not on file  Social History Narrative   Divorced   1 son /1 adopted adult son  2 dogs    Sales occupation on disability for now   Regular exercise-yes money stress   Daily caffeine use 2-3 and 2 cups of coffee   On since a security disability since 2012   Patient is right-handed.   Patient has a Master's degree.   Social Determinants of Health   Financial Resource Strain:   . Difficulty of Paying Living Expenses: Not on file  Food Insecurity:   . Worried About Charity fundraiser in the Last Year: Not on file  . Ran Out of Food in the Last Year: Not on file  Transportation Needs:   . Lack of Transportation (Medical): Not on file  . Lack of Transportation (Non-Medical): Not on file  Physical Activity:   . Days of Exercise per Week: Not on file  . Minutes of Exercise per Session: Not on file  Stress:   . Feeling of Stress : Not on file  Social Connections:   . Frequency of Communication with Friends and Family: Not on file  . Frequency of Social Gatherings with Friends and Family: Not on file  . Attends  Religious Services: Not on file  . Active Member of Clubs or Organizations: Not on file  . Attends Archivist Meetings: Not on file  . Marital Status: Not on file  Intimate Partner Violence:   . Fear of Current or Ex-Partner: Not on file  . Emotionally Abused: Not on file  . Physically Abused: Not on file  . Sexually Abused: Not on file      PHYSICAL EXAM  Vitals:   02/09/20 1018  BP: 121/90  Pulse: 84  Weight: 223 lb (101.2 kg)  Height: 5' 8"  (1.727 m)   Body mass index is 33.91 kg/m.  Generalized: Well developed, in no acute distress, She is resigned - not tearful. accepting of stabile deficits.   [place]  Cognitive Assessment  02/09/2020 11/25/2018 05/26/2018 05/22/2017 06/05/2016  Visuospatial/ Executive (0/5) 5 3 4 3 4   Naming (0/3) 3 3 3 3 3   Attention: Read list of digits (0/2) 1 0 1 0 1  Attention: Read list of letters (0/1) 1 1 1 1 1   Attention: Serial 7 subtraction starting at 100 (0/3) 2 3 3 3 3   Language: Repeat phrase (0/2) 2 1 2 2 2   Language : Fluency (0/1) 1 1 1 1 1   Abstraction (0/2) 2 2 2 2 2   Delayed Recall (0/5) 3 3 3 3 2   Orientation (0/6) 5 5 5 5 6   Total 25 22 25 23 25   Adjusted Score (based on education) - - - - 25    Neurological examination  Mentation: Alert oriented to time, place, history taking. Follows all commands speech and language fluent.  Cranial nerve : No change in taste ,but complete loss of smell .ANOSMIA. for 5 years now.  Pupils were equal round reactive to light. Extraocular movements were full, visual field were full on confrontational test . Facial sensation and strength were normal. Uvula tongue midline.  Bilateral tongue protrusion is strong.   Motor: normal strength in all extremities, except for either foot's dorsiflexion.   symmetric, normal  motor tone is noted throughout.  Sensory:  Progressed, profound numbness in feet and calf. Arising from the toes through the forefoot to the heel. Pain arising from the  Forefoot.  Hand pain and Raynaud's up to each wrist, She injured( cut ) her foot and didn't feel it,also it was bleeding.   Coordination: normal handwriting.  Gait and station: Gait is intact on an even surface- reportedly tends to fall on uneven surfaces. - Tandem gait is abnormal.  She needs to control her steps - needs to watch her steps,needs to see where she steps due to lack of sensation.    Romberg is negative !  Reflexes: Deep tendon reflexes are symmetrically attenuated,  Bilaterally still present, but trace.  Righting reflexes are delayed, she is falling easily when she steps backwards or turning.   DIAGNOSTIC DATA (LABS, IMAGING, TESTING) - I reviewed patient records, labs, notes, testing and imaging myself where available.  Dr Gaspar Cola note and testing. MOCA.     Lab Results  Component Value Date   WBC 6.7 02/25/2019   HGB 13.6 02/25/2019   HCT 41.0 02/25/2019   MCV 87.0 02/25/2019   PLT 298 02/25/2019    Lab Results  Component Value Date   CHOL 185 02/09/2019   HDL 45.50 02/09/2019   LDLCALC 101 (H) 02/09/2019   LDLDIRECT 98.7 09/04/2009   TRIG 189.0 (H) 02/09/2019   CHOLHDL 4 02/09/2019    ASSESSMENT AND PLAN  30 Minute Revisit.  59 y.o. year old biracial [place] female, widowed,  right handed, patient , here for q 6 month memory testing and refills.   0)  Ovarian Cancer diagnosed in 2009 ! She is a survivor- and made a remarkable recovery. By January 2020 she was happy and well, and during the pandemic she became  tearful , lonely, tearful. She was  depressed, clinically, tearful- needs cognitive therapy and a listener, rather than a prescriber.  She has regained her composure and she is managing her life well, has benefited from cognitive behavior therapy.    1. 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  I have refilled ambien- I do not think it's worth weaning off, given her longstanding use of sleep aids. Insomnia has improved. She  needs more  energy, more focus. We discussed adderall. She accepted my offer. 10 mg XR.   2. Cognitive changes are stabile in patient with ovarian cancer and long term chemotherapy - MOCA now again 25-30 from 22-30 since  January 2017.   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  NP also with Dysautonomia, syncopal events, Has fallen in her house. She is aware of her surroundings, stunned but not confused, not postictal. Had a fall in 2016, in 2017, and 3 in 2018 - but none in 2020. NCV did not show anything 4 years ago.    Patient's neuropathy is progressive and dysautonomia is part of this PNP. She already had accu-puncture and cupping and dry needling.   She continues on Lyrica, now  200 mg bid and Duloxetine.- prescribed by Shanon Ace, MD    Larey Seat, MD         Larey Seat, MD   02/09/2020, 11:03 AM [facility] [address] [place], [place] [address] [phone]

## 2020-02-09 NOTE — Patient Instructions (Signed)
Amphetamine; Dextroamphetamine extended-release capsules What is this medicine? AMPHETAMINE; DEXTROAMPHETAMINE (am FET a meen; dex troe am FET a meen) is used to treat attention-deficit hyperactivity disorder (ADHD). Federal law prohibits giving this medicine to any person other than the person for whom it was prescribed. Do not share this medicine with anyone else. This medicine may be used for other purposes; ask your health care provider or pharmacist if you have questions. COMMON BRAND NAME(S): Adderall XR, Mydayis What should I tell my health care provider before I take this medicine? They need to know if you have any of these conditions:  anxiety or panic attacks  circulation problems in fingers and toes  glaucoma  hardening or blockages of the arteries or heart blood vessels  heart disease or a heart defect  high blood pressure  history of a drug or alcohol abuse problem  history of stroke  kidney disease  liver disease  mental illness  seizures  suicidal thoughts, plans, or attempt; a previous suicide attempt by you or a family member  thyroid disease  Tourette's syndrome  an unusual or allergic reaction to dextroamphetamine, other amphetamines, other medicines, foods, dyes, or preservatives  pregnant or trying to get pregnant  breast-feeding How should I use this medicine? Take this medicine by mouth with a glass of water. Follow the directions on the prescription label. This medicine is taken just one time per day, usually in the morning after waking up. Take with or without food. Do not chew or crush this medicine. You may open the capsules and sprinkle the medicine on a spoonful of applesauce. If sprinkled on applesauce, take the dose immediately and do not crush or chew. Do not take your medicine more often than directed. A special MedGuide will be given to you by the pharmacist with each prescription and refill. Be sure to read this information carefully  each time. Talk to your pediatrician regarding the use of this medicine in children. While this drug may be prescribed for children as young as 6 years for selected conditions, precautions do apply. Some extended-release capsules are recommended for use only in children 36 years of age and older. Overdosage: If you think you have taken too much of this medicine contact a poison control center or emergency room at once. NOTE: This medicine is only for you. Do not share this medicine with others. What if I miss a dose? If you miss a dose, take it as soon as you can in the morning, but do not take it later in the day because it can cause trouble sleeping. If it is almost time for your next dose, take only that dose. Do not take double or extra doses. What may interact with this medicine? Do not take this medicine with any of the following medications:  MAOIs like Carbex, Eldepryl, Marplan, Nardil, and Parnate  other stimulant medicines for attention disorders This medicine may also interact with the following medications:  acetazolamide  alcohol  ammonium chloride  antacids  ascorbic acid  atomoxetine  caffeine  certain medicines for blood pressure  certain medicines for depression, anxiety, or psychotic disturbances  certain medicines for seizures like carbamazepine, phenobarbital, phenytoin  certain medicines for stomach problems like cimetidine, ranitidine, famotidine, esomeprazole, omeprazole, lansoprazole, pantoprazole  lithium  medicines for colds and breathing difficulties  medicines for diabetes  medicines or dietary supplements for weight loss or to stay awake  methenamine  narcotic medicines for pain  quinidine  ritonavir  sodium bicarbonate  St.  John's wort This list may not describe all possible interactions. Give your health care provider a list of all the medicines, herbs, non-prescription drugs, or dietary supplements you use. Also tell them if you  smoke, drink alcohol, or use illegal drugs. Some items may interact with your medicine. What should I watch for while using this medicine? Visit your doctor or health care professional for regular checks on your progress. This prescription requires that you follow special procedures with your doctor and pharmacy. You will need to have a new written prescription from your doctor every time you need a refill. This medicine may affect your concentration, or hide signs of tiredness. Until you know how this medicine affects you, do not drive, ride a bicycle, use machinery, or do anything that needs mental alertness. Alcohol should be avoided with some brands of this medicine. Talk to your doctor or health care professional if you have questions. Tell your doctor or health care professional if this medicine loses its effects, or if you feel you need to take more than the prescribed amount. Do not change the dosage without talking to your doctor or health care professional. Decreased appetite is a common side effect when starting this medicine. Eating small, frequent meals or snacks can help. Talk to your doctor if you continue to have poor eating habits. Height and weight growth of a child taking this medicine will be monitored closely. Do not take this medicine close to bedtime. It may prevent you from sleeping. If you are going to need surgery, an MRI, a CT scan, or other procedure, tell your doctor that you are taking this medicine. You may need to stop taking this medicine before the procedure. Tell your doctor or healthcare professional right away if you notice unexplained wounds on your fingers and toes while taking this medicine. You should also tell your healthcare provider if you experience numbness or pain, changes in the skin color, or sensitivity to temperature in your fingers or toes. What side effects may I notice from receiving this medicine? Side effects that you should report to your doctor or  health care professional as soon as possible:  allergic reactions like skin rash, itching or hives, swelling of the face, lips, or tongue  anxious  breathing problems  changes in emotions or moods  changes in vision  chest pain or chest tightness  fast, irregular heartbeat  fingers or toes feel numb, cool, painful  hallucination, loss of contact with reality  high blood pressure  males: prolonged or painful erection  seizures  signs and symptoms of serotonin syndrome like confusion, increased sweating, fever, tremor, stiff muscles, diarrhea  signs and symptoms of a stroke like changes in vision; confusion; trouble speaking or understanding; severe headaches; sudden numbness or weakness of the face, arm or leg; trouble walking; dizziness; loss of balance or coordination  suicidal thoughts or other mood changes  uncontrollable head, mouth, neck, arm, or leg movements Side effects that usually do not require medical attention (report to your doctor or health care professional if they continue or are bothersome):  dry mouth  headache  irritability  loss of appetite  nausea  trouble sleeping  weight loss This list may not describe all possible side effects. Call your doctor for medical advice about side effects. You may report side effects to FDA at 1-800-FDA-1088. Where should I keep my medicine? Keep out of the reach of children. This medicine can be abused. Keep your medicine in a safe place to  protect it from theft. Do not share this medicine with anyone. Selling or giving away this medicine is dangerous and against the law. Store at room temperature between 15 and 30 degrees C (59 and 86 degrees F). Keep container tightly closed. Protect from light. Throw away any unused medicine after the expiration date. NOTE: This sheet is a summary. It may not cover all possible information. If you have questions about this medicine, talk to your doctor, pharmacist, or health  care provider.  2020 Elsevier/Gold Standard (2016-06-23 13:37:27)

## 2020-02-10 ENCOUNTER — Other Ambulatory Visit: Payer: Self-pay | Admitting: Neurology

## 2020-02-10 MED ORDER — AMPHETAMINE-DEXTROAMPHET ER 20 MG PO CP24
20.0000 mg | ORAL_CAPSULE | Freq: Every day | ORAL | 0 refills | Status: DC
Start: 2020-02-10 — End: 2020-03-27

## 2020-02-10 MED ORDER — AMPHETAMINE-DEXTROAMPHET ER 20 MG PO CP24: 20.0000 mg | ORAL_CAPSULE | Freq: Every day | ORAL | 0 refills | Status: DC

## 2020-02-10 NOTE — Telephone Encounter (Signed)
Called the patient back and advised that she has sent generic 20 mg XR for her. Patient is asking it go to [facility] of [facility] states that she will have to use [facility] for the medication and was cheaper at [facility] by a lot. Informed her that I would tell Dr Brett Fairy

## 2020-02-10 NOTE — Telephone Encounter (Signed)
Pt called, can you change amphetamine-dextroamphetamine (ADDERALL XR) 10 MG 24 hr capsule to 20 mg or a generic brand, because of the cost? Would like a phone from the nurse.

## 2020-02-10 NOTE — Addendum Note (Signed)
Addended by: Larey Seat on: 02/10/2020 04:59 PM   Modules accepted: Orders

## 2020-02-10 NOTE — Addendum Note (Signed)
Addended by: Darleen Crocker on: 02/10/2020 04:52 PM   Modules accepted: Orders

## 2020-02-14 DIAGNOSIS — I10 Essential (primary) hypertension: Secondary | ICD-10-CM | POA: Diagnosis not present

## 2020-02-14 DIAGNOSIS — H26492 Other secondary cataract, left eye: Secondary | ICD-10-CM | POA: Diagnosis not present

## 2020-02-15 DIAGNOSIS — Z0289 Encounter for other administrative examinations: Secondary | ICD-10-CM

## 2020-02-16 ENCOUNTER — Encounter: Payer: Medicare Other | Attending: Psychology | Admitting: Psychology

## 2020-02-16 DIAGNOSIS — F331 Major depressive disorder, recurrent, moderate: Secondary | ICD-10-CM | POA: Insufficient documentation

## 2020-02-16 DIAGNOSIS — R4189 Other symptoms and signs involving cognitive functions and awareness: Secondary | ICD-10-CM | POA: Insufficient documentation

## 2020-02-16 DIAGNOSIS — G63 Polyneuropathy in diseases classified elsewhere: Secondary | ICD-10-CM | POA: Insufficient documentation

## 2020-02-16 DIAGNOSIS — C801 Malignant (primary) neoplasm, unspecified: Secondary | ICD-10-CM | POA: Insufficient documentation

## 2020-02-16 DIAGNOSIS — R29818 Other symptoms and signs involving the nervous system: Secondary | ICD-10-CM | POA: Insufficient documentation

## 2020-02-17 ENCOUNTER — Other Ambulatory Visit: Payer: Self-pay | Admitting: Family

## 2020-02-17 DIAGNOSIS — C801 Malignant (primary) neoplasm, unspecified: Secondary | ICD-10-CM

## 2020-02-18 DIAGNOSIS — H53412 Scotoma involving central area, left eye: Secondary | ICD-10-CM | POA: Diagnosis not present

## 2020-02-18 DIAGNOSIS — H5310 Unspecified subjective visual disturbances: Secondary | ICD-10-CM | POA: Diagnosis not present

## 2020-02-18 DIAGNOSIS — H43812 Vitreous degeneration, left eye: Secondary | ICD-10-CM | POA: Diagnosis not present

## 2020-02-22 ENCOUNTER — Other Ambulatory Visit: Payer: Self-pay | Admitting: Internal Medicine

## 2020-02-24 ENCOUNTER — Other Ambulatory Visit: Payer: Self-pay | Admitting: *Deleted

## 2020-02-24 DIAGNOSIS — C801 Malignant (primary) neoplasm, unspecified: Secondary | ICD-10-CM

## 2020-02-24 DIAGNOSIS — D500 Iron deficiency anemia secondary to blood loss (chronic): Secondary | ICD-10-CM

## 2020-02-24 DIAGNOSIS — Z1502 Genetic susceptibility to malignant neoplasm of ovary: Secondary | ICD-10-CM

## 2020-02-24 DIAGNOSIS — G63 Polyneuropathy in diseases classified elsewhere: Secondary | ICD-10-CM

## 2020-02-25 ENCOUNTER — Telehealth: Payer: Self-pay | Admitting: *Deleted

## 2020-02-25 ENCOUNTER — Inpatient Hospital Stay: Payer: Medicare Other

## 2020-02-25 ENCOUNTER — Inpatient Hospital Stay: Payer: Medicare Other | Admitting: Hematology & Oncology

## 2020-02-25 NOTE — Telephone Encounter (Signed)
Call received from patient stating that she is having "stomach issues" and can not make her scheduled appt today.  Patient transferred to scheduling.

## 2020-02-29 ENCOUNTER — Other Ambulatory Visit: Payer: Self-pay | Admitting: Internal Medicine

## 2020-03-01 ENCOUNTER — Encounter: Payer: Medicare Other | Admitting: Psychology

## 2020-03-01 ENCOUNTER — Ambulatory Visit (INDEPENDENT_AMBULATORY_CARE_PROVIDER_SITE_OTHER): Payer: Medicare Other | Admitting: Internal Medicine

## 2020-03-01 ENCOUNTER — Encounter: Payer: Self-pay | Admitting: Internal Medicine

## 2020-03-01 ENCOUNTER — Encounter (HOSPITAL_BASED_OUTPATIENT_CLINIC_OR_DEPARTMENT_OTHER): Payer: Self-pay

## 2020-03-01 ENCOUNTER — Other Ambulatory Visit: Payer: Self-pay

## 2020-03-01 ENCOUNTER — Encounter: Payer: Self-pay | Admitting: Psychology

## 2020-03-01 ENCOUNTER — Ambulatory Visit (HOSPITAL_BASED_OUTPATIENT_CLINIC_OR_DEPARTMENT_OTHER)
Admission: RE | Admit: 2020-03-01 | Discharge: 2020-03-01 | Disposition: A | Discharge status: Home / Self Care | Payer: Medicare Other | Source: Ambulatory Visit | Attending: Internal Medicine | Admitting: Internal Medicine

## 2020-03-01 VITALS — BP 138/84 | HR 97 | Temp 98.0°F | Resp 16 | Ht 68.0 in | Wt 216.6 lb

## 2020-03-01 DIAGNOSIS — F331 Major depressive disorder, recurrent, moderate: Secondary | ICD-10-CM | POA: Diagnosis not present

## 2020-03-01 DIAGNOSIS — R1030 Lower abdominal pain, unspecified: Secondary | ICD-10-CM

## 2020-03-01 DIAGNOSIS — R29818 Other symptoms and signs involving the nervous system: Secondary | ICD-10-CM | POA: Diagnosis not present

## 2020-03-01 DIAGNOSIS — E611 Iron deficiency: Secondary | ICD-10-CM | POA: Diagnosis not present

## 2020-03-01 DIAGNOSIS — C801 Malignant (primary) neoplasm, unspecified: Secondary | ICD-10-CM

## 2020-03-01 DIAGNOSIS — G63 Polyneuropathy in diseases classified elsewhere: Secondary | ICD-10-CM | POA: Diagnosis not present

## 2020-03-01 DIAGNOSIS — R109 Unspecified abdominal pain: Secondary | ICD-10-CM | POA: Diagnosis not present

## 2020-03-01 DIAGNOSIS — R4189 Other symptoms and signs involving cognitive functions and awareness: Secondary | ICD-10-CM | POA: Diagnosis present

## 2020-03-01 DIAGNOSIS — R195 Other fecal abnormalities: Secondary | ICD-10-CM

## 2020-03-01 LAB — POCT URINALYSIS DIPSTICK
Bilirubin, UA: NEGATIVE
Glucose, UA: NEGATIVE
Ketones, UA: NEGATIVE
Leukocytes, UA: NEGATIVE
Nitrite, UA: NEGATIVE
Protein, UA: NEGATIVE
Spec Grav, UA: 1.02 (ref 1.010–1.025)
Urobilinogen, UA: 0.2 E.U./dL
pH, UA: 6 (ref 5.0–8.0)

## 2020-03-01 NOTE — Progress Notes (Signed)
Chief Complaint  Patient presents with  . Acute Visit    abdominal pain, cramping, mucus in stool     HPI: [name] 59 y.o. come in for 5 days  Acute onset   Friday oct 15 and  Better yesterday  worse today to see dr Marin Olp   Felt like  Stomach virus  ROS: See pertinent positives and negatives per HPI.no fever  Had been on antibiotic for 3 weeks for foot infection  In September   Tends to have constipation at times but going regular   Until 5 days ago  No other systemic sx noted  Past Medical History:  Diagnosis Date  . 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 Adjustment disorder   . Allergic rhinitis   . Alopecia   . Anxiety and depression   . 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 HLD (hyperlipidemia)   . Hx pulmonary embolism   . Hypertension   . Iron deficiency anemia due to chronic blood loss 11/04/2016  . Metrorrhagia   . Neuropathy   . Obesity   . Ovarian cancer ([employer]) 04/2008   ChemoTherapy  . 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 Raynaud's syndrome   . Right groin mass   . Squamous cell carcinoma of skin   . Vision abnormalities   . Vitamin D deficiency     Family History  Problem Relation Age of Onset  . Pulmonary embolism Mother   . Hypertension Father   . Breast cancer Maternal Grandmother        diagnosed in early 31s  . Hyperlipidemia Other   . Breast cancer Sister 35    Social History   Socioeconomic History  . Marital status: Divorced    Spouse name: Not on file  . Number of children: 1  . Years of education: Master's  . Highest education level: Not on file  Occupational History    Employer: UNEMPLOYED    Comment: disabled  Tobacco Use  . Smoking status: Current Every Day Smoker    Packs/day: 1.00    Years: 37.00    Pack years: 37.00    Types: Cigarettes    Start date: 06/16/1978  . Smokeless tobacco: Never Used  . Tobacco comment: 2- 7-16  STILL SMOKING  Vaping Use  . Vaping Use: Never used    Substance and Sexual Activity  . Alcohol use: Yes    Alcohol/week: 0.0 standard drinks    Comment: rare, 3 weekly  . Drug use: No  . Sexual activity: Not on file  Other Topics Concern  . Not on file  Social History Narrative   Divorced   1 son /1 adopted adult son  2 dogs    Sales occupation on disability for now   Regular exercise-yes money stress   Daily caffeine use 2-3 and 2 cups of coffee   On since a security disability since 2012   Patient is right-handed.   Patient has a Master's degree.   Social Determinants of Health   Financial Resource Strain:   . Difficulty of Paying Living Expenses: Not on file  Food Insecurity:   . Worried About Charity fundraiser in the Last Year: Not on file  . Ran Out of Food in the Last Year: Not on file  Transportation Needs:   . Lack of Transportation (Medical): Not on file  . Lack of Transportation (Non-Medical): Not on file  Physical Activity:   . Days of  Exercise per Week: Not on file  . Minutes of Exercise per Session: Not on file  Stress:   . Feeling of Stress : Not on file  Social Connections:   . Frequency of Communication with Friends and Family: Not on file  . Frequency of Social Gatherings with Friends and Family: Not on file  . Attends Religious Services: Not on file  . Active Member of Clubs or Organizations: Not on file  . Attends Archivist Meetings: Not on file  . Marital Status: Not on file    Outpatient Medications Prior to Visit  Medication Sig Dispense Refill  . amphetamine-dextroamphetamine (ADDERALL XR) 20 MG 24 hr capsule Take 1 capsule (20 mg total) by mouth daily. 30 capsule 0  . aspirin 81 MG tablet Take 81 mg by mouth daily. 2 TABS IN AM    . butalbital-acetaminophen-caffeine (FIORICET, ESGIC) 50-325-40 MG tablet TAKE 1 TABLET BY MOUTH EVERY 6 HOURS AS NEEDED FOR HEADACHE 10 tablet 3  . calcium carbonate (TUMS - DOSED IN MG ELEMENTAL CALCIUM) 500 MG chewable tablet Chew 1 tablet by mouth daily  as needed for indigestion or heartburn.    . diclofenac (VOLTAREN) 75 MG EC tablet TK 1 T PO BID AFTER MEALS FOR INFLAMMATION / PAIN / SWELLING PRN ONLY  1  . donepezil (ARICEPT) 10 MG tablet Take 1 tablet (10 mg total) by mouth every morning. 90 tablet 2  . DULoxetine (CYMBALTA) 60 MG capsule TAKE 1 CAPSULE BY MOUTH DAILY 90 capsule 0  . Multiple Vitamin (MULTIVITAMIN WITH MINERALS) TABS tablet Take 1 tablet by mouth daily.    . orphenadrine (NORFLEX) 100 MG tablet Take 1 tablet (100 mg total) by mouth 2 (two) times daily as needed for muscle spasms. Schedule follow up visit for more refills. [phone] 180 tablet 0  . potassium chloride (KLOR-CON) 10 MEQ tablet TAKE 3 TABLETS BY MOUTH THREE TIMES DAILY. PLEASE SCHEDULE YEARLY VISIT FOR REFILLS 270 tablet 0  . pregabalin (LYRICA) 200 MG capsule TAKE 1 CAPSULE(200 MG) BY MOUTH TWICE DAILY 60 capsule 3  . simvastatin (ZOCOR) 40 MG tablet TAKE 1 TABLET BY MOUTH DAILY 90 tablet 0  . triamterene-hydrochlorothiazide (MAXZIDE-25) 37.5-25 MG tablet TAKE 2 TABLETS BY MOUTH DAILY 180 tablet 1  . Vitamin D, Ergocalciferol, (DRISDOL) 1.25 MG (50000 UNIT) CAPS capsule TAKE 1 CAPSULE BY MOUTH 1 TIME A WEEK 13 capsule 2  . zolpidem (AMBIEN) 5 MG tablet Take 1-2 tablets (5-10 mg total) by mouth at bedtime as needed. 180 tablet 0   No facility-administered medications prior to visit.     EXAM:  BP 138/84 (BP Location: Right Arm, Cuff Size: Normal)   Pulse 97   Temp 98 F (36.7 C) (Oral)   Resp 16   Ht _0  (1.727 m)   Wt 216 lb 9.6 oz (98.2 kg)   SpO2 98%   BMI 32.93 kg/m   Body mass index is 32.93 kg/m.  GENERAL: vitals reviewed and listed above, alert, oriented, appears well hydrated and in no acute distress HEENT: atraumatic, conjunctiva  clear, no obvious abnormalities on inspection of external nose and ears OP : masked  NECK: no obvious masses on inspection palpation  LUNGS: clear to auscultation bilaterally, no wheezes, rales or rhonchi,  good air movement CV: HRRR, no clubbing cyanosis or  peripheral edema nl cap refill  Abdomen:  Sof,t normal bowel sounds without hepatosplenomegaly, no guarding rebound or masses no CVA tenderness midl left side tenderness  MS: moves all  extremities without noticeable focal  abnormality PSYCH: pleasant and cooperative, no obvious depression or anxiety Lab Results  Component Value Date   WBC 9.3 03/01/2020   HGB 15.4 03/01/2020   HCT 45.6 (H) 03/01/2020   PLT 235 03/01/2020   GLUCOSE 81 03/01/2020   CHOL 185 02/09/2019   TRIG 189.0 (H) 02/09/2019   HDL 45.50 02/09/2019   LDLDIRECT 98.7 09/04/2009   LDLCALC 101 (H) 02/09/2019   ALT 25 03/01/2020   AST 24 03/01/2020   NA 137 03/01/2020   K 3.8 03/01/2020   CL 100 03/01/2020   CREATININE 1.07 (H) 03/01/2020   BUN 27 (H) 03/01/2020   CO2 29 03/01/2020   TSH 3.90 03/01/2020   INR 0.86 06/02/2013   HGBA1C 6.2 02/09/2019   BP Readings from Last 3 Encounters:  03/01/20 138/84  02/09/20 121/90  01/31/20 124/84   Stool is formed with large ampount of pink mucous   Only faintly trace pos? Hemoccult  ASSESSMENT AND PLAN:  Discussed the following assessment and plan:  Abdominal pain, unspecified abdominal location - Plan: POCT urinalysis dipstick, CBC with Differential/Platelet, C-reactive protein, TSH, T4, free, Iron, TIBC and Ferritin Panel, CA 125, CMP with eGFR([facility]), CT Abdomen Pelvis W Contrast, CMP with eGFR([facility]), CA 125, Iron, TIBC and Ferritin Panel, T4, free, TSH, C-reactive protein, CBC with Differential/Platelet  Mucous in stools - Plan: CBC with Differential/Platelet, C-reactive protein, TSH, T4, free, Iron, TIBC and Ferritin Panel, CA 125, CMP with eGFR([facility]), CT Abdomen Pelvis W Contrast, CMP with eGFR([facility]), CA 125, Iron, TIBC and Ferritin Panel, T4, free, TSH, C-reactive protein, CBC with Differential/Platelet  Abdominal cramps - Plan: CBC with Differential/Platelet, C-reactive protein, TSH, T4, free, Iron, TIBC and  Ferritin Panel, CA 125, CMP with eGFR([facility]), CT Abdomen Pelvis W Contrast, CMP with eGFR([facility]), CA 125, Iron, TIBC and Ferritin Panel, T4, free, TSH, C-reactive protein, CBC with Differential/Platelet  Carcinoma of unknown primary ([place]) - Plan: CBC with Differential/Platelet, C-reactive protein, TSH, T4, free, Iron, TIBC and Ferritin Panel, CA 125, CMP with eGFR([facility]), CT Abdomen Pelvis W Contrast, CMP with eGFR([facility]), CA 125, Iron, TIBC and Ferritin Panel, T4, free, TSH, C-reactive protein, CBC with Differential/Platelet  Iron deficiency - Plan: CBC with Differential/Platelet, C-reactive protein, TSH, T4, free, Iron, TIBC and Ferritin Panel, CA 125, CMP with eGFR([facility]), CT Abdomen Pelvis W Contrast, CMP with eGFR([facility]), CA 125, Iron, TIBC and Ferritin Panel, T4, free, TSH, C-reactive protein, CBC with Differential/Platelet  Lower abdominal pain - Plan: CT Abdomen Pelvis W Contrast   Uncertain  Cause   Mucous looks pink and lots ? Proctitis?  But no gross blood and stool is formed . But infrequent ? Infectious vs  IBD?  Labs  To include  Oncology labs   abd ct  Needs labs  Updated  Some in from oncology   tsh free t4 cbc cmp iron studies and  Ca 125 per oncology  crp Last ct abd  Done  10 2020  For hip back pain -Patient advised to return or notify health care team  if  new concerns arise. revewied counsel  And plan  45 minues Patient Instructions  Lab today  Ct ordered   Stat .  Liquid diet  .  Will end up with getting Gi involved also      Mariann Laster K. Lyle Niblett M.D.  IMPRESSION: 1. Status post hysterectomy, without evidence of recurrent or metastatic disease. 2.  Possible constipation. 3. No specific explanation for hip pain. 4. Hiatal hernia with surgical changes  about the herniated stomach. Apparent wall thickening within both the herniated stomach and distal gastric body could be due to underdistention. If esophagitis is a concern, consider endoscopy. 5.  Aortic Atherosclerosis  (ICD10-I70.0).   Electronically Signed   By: Abigail Miyamoto M.D.   On: 03/09/2019 12:21

## 2020-03-01 NOTE — Patient Instructions (Addendum)
Lab today  Ct ordered   Stat .  Liquid diet  .  Will end up with getting Gi involved also

## 2020-03-01 NOTE — Progress Notes (Signed)
Neuropsychology Visit  Patient:  [name]   DOB: 04-27-1961  MR Number: [id number]  Location: [facility] [place], [place] [id number] [place] [address] Dept: [phone]  Date of Service: 03/01/2020  Start: 11 AM End: 12 PM  Today's visit was a 1 hour in person visit that was conducted in my outpatient clinic office with myself and the patient present for this visit.  Duration of Service: 1 Hour  Provider/Observer:     Edgardo Roys PsyD  Chief Complaint:      Chief Complaint  Patient presents with  . Pain  . 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  The patient has had residual cognitive changes similar to possible impacts of chemotherapy as well as significant residual pain symptoms.  The patient was diagnosed and treated for cancer 10 years ago and continues to have ongoing follow-up regarding possible recurrence.  The patient has had neuropathy, insomnia and memory loss post chemotherapy.  The patient has depressive symptoms that have developed and has a hard time motivating and a hard time keeping focused on life and staying engaged.  The patient has continued struggle with issues related to coping and adjustment with pain subsequent to cognitive deficits and residual effects from her chemotherapy treatment.  The patient has had a lot of recent stressors and recent GI distress that is raised increased fears about recurrence of her cancer.  Treatment Interventions:  Therapeutic interventions for issues associated with chronic pain, adjustment issues, depression  and cognitive difficulties following a diagnosis and treatment for cancer which was primarily ovarian in nature.  Participation Level:   Active  Participation Quality:  Appropriate and Attentive      Behavioral Observation:  Well Groomed, Alert, and Appropriate.   Current Psychosocial Factors: The patient has been able to successfully get some family members to become vaccinated which has been a major stressor for her with her anxiety and fear about further illness.  The patient has had a lot of recent GI distress that is new for her and has also increased her anxiety around possible recurrence of her cancer.  The patient reports that her pain in general has improved but her acute pain and new pain symptoms have been problematic for her and impacting her underlying anxiety and depressive symptomatology.  Content of Session:   Reviewed current symptoms and worked on therapeutic interventions around issues of depression, cognitive difficulties and residual post chemotherapy.  Effectiveness of Interventions: The patient was very active and open during the visit we had an effective appointment.  Target Goals:   Target goals include working on building coping skills and strategies around issues of depression and residual effects of her chemotherapy including residual cognitive deficits.  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  The patient has had residual cognitive changes similar to possible impacts  of chemotherapy as well as significant residual pain symptoms.  The patient was diagnosed and treated for cancer 10 years ago and continues to have ongoing follow-up regarding possible  recurrence.  The patient has had neuropathy, insomnia and memory loss post chemotherapy.  The patient has depressive symptoms that have developed and has a hard time motivating and a hard time keeping focused on life and staying engaged.  The patient describes depressive symptoms, including motivation, feelings of helplessness and hopelessness and anxiety about recurrence of her cancer.  The patient also describes ongoing chronic pain symptoms and neuropathy as well as insomnia and memory loss.  Today we worked specifically on behavioral adaptations and building coping skills around issues of her difficulty completing necessary daily activities and daily requirements as her cognitive change in cognitive impairments and impact they have had on emotional status and self-esteem issues are having a very significant deleterious effect on her.  We will continue to work on therapeutic interventions around coping skills and strategies with regard to her depression and residual effects of her cognitive symptoms and pain symptoms post chemotherapy.  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 Clinical Psychologist Neuropsychologist

## 2020-03-02 ENCOUNTER — Ambulatory Visit (HOSPITAL_BASED_OUTPATIENT_CLINIC_OR_DEPARTMENT_OTHER): Payer: Medicare Other

## 2020-03-02 ENCOUNTER — Ambulatory Visit (HOSPITAL_BASED_OUTPATIENT_CLINIC_OR_DEPARTMENT_OTHER)
Admission: RE | Admit: 2020-03-02 | Discharge: 2020-03-02 | Disposition: A | Discharge status: Home / Self Care | Payer: Medicare Other | Source: Ambulatory Visit | Attending: Internal Medicine | Admitting: Internal Medicine

## 2020-03-02 ENCOUNTER — Telehealth: Payer: Self-pay | Admitting: Internal Medicine

## 2020-03-02 ENCOUNTER — Encounter (HOSPITAL_BASED_OUTPATIENT_CLINIC_OR_DEPARTMENT_OTHER): Payer: Self-pay

## 2020-03-02 ENCOUNTER — Telehealth: Payer: Self-pay | Admitting: *Deleted

## 2020-03-02 DIAGNOSIS — M47816 Spondylosis without myelopathy or radiculopathy, lumbar region: Secondary | ICD-10-CM | POA: Diagnosis not present

## 2020-03-02 DIAGNOSIS — R1030 Lower abdominal pain, unspecified: Secondary | ICD-10-CM | POA: Insufficient documentation

## 2020-03-02 DIAGNOSIS — R109 Unspecified abdominal pain: Secondary | ICD-10-CM | POA: Insufficient documentation

## 2020-03-02 DIAGNOSIS — I700 Atherosclerosis of aorta: Secondary | ICD-10-CM | POA: Diagnosis not present

## 2020-03-02 DIAGNOSIS — R195 Other fecal abnormalities: Secondary | ICD-10-CM | POA: Diagnosis not present

## 2020-03-02 DIAGNOSIS — E611 Iron deficiency: Secondary | ICD-10-CM | POA: Insufficient documentation

## 2020-03-02 DIAGNOSIS — C801 Malignant (primary) neoplasm, unspecified: Secondary | ICD-10-CM | POA: Diagnosis not present

## 2020-03-02 DIAGNOSIS — K449 Diaphragmatic hernia without obstruction or gangrene: Secondary | ICD-10-CM | POA: Diagnosis not present

## 2020-03-02 DIAGNOSIS — R1032 Left lower quadrant pain: Secondary | ICD-10-CM | POA: Diagnosis not present

## 2020-03-02 LAB — CBC WITH DIFFERENTIAL/PLATELET
Absolute Monocytes: 781 cells/uL (ref 200–950)
Basophils Absolute: 19 cells/uL (ref 0–200)
Basophils Relative: 0.2 %
Eosinophils Absolute: 260 cells/uL (ref 15–500)
Eosinophils Relative: 2.8 %
HCT: 45.6 % — ABNORMAL HIGH (ref 35.0–45.0)
Hemoglobin: 15.4 g/dL (ref 11.7–15.5)
Lymphs Abs: 1488 cells/uL (ref 850–3900)
MCH: 29.8 pg (ref 27.0–33.0)
MCHC: 33.8 g/dL (ref 32.0–36.0)
MCV: 88.4 fL (ref 80.0–100.0)
MPV: 12 fL (ref 7.5–12.5)
Monocytes Relative: 8.4 %
Neutro Abs: 6752 cells/uL (ref 1500–7800)
Neutrophils Relative %: 72.6 %
Platelets: 235 10*3/uL (ref 140–400)
RBC: 5.16 10*6/uL — ABNORMAL HIGH (ref 3.80–5.10)
RDW: 13.1 % (ref 11.0–15.0)
Total Lymphocyte: 16 %
WBC: 9.3 10*3/uL (ref 3.8–10.8)

## 2020-03-02 LAB — COMPLETE METABOLIC PANEL WITH GFR
AG Ratio: 1.6 (calc) (ref 1.0–2.5)
ALT: 25 U/L (ref 6–29)
AST: 24 U/L (ref 10–35)
Albumin: 4.1 g/dL (ref 3.6–5.1)
Alkaline phosphatase (APISO): 91 U/L (ref 37–153)
BUN/Creatinine Ratio: 25 (calc) — ABNORMAL HIGH (ref 6–22)
BUN: 27 mg/dL — ABNORMAL HIGH (ref 7–25)
CO2: 29 mmol/L (ref 20–32)
Calcium: 9.9 mg/dL (ref 8.6–10.4)
Chloride: 100 mmol/L (ref 98–110)
Creat: 1.07 mg/dL — ABNORMAL HIGH (ref 0.50–1.05)
GFR, Est African American: 66 mL/min/{1.73_m2} (ref >=60)
GFR, Est Non African American: 57 mL/min/{1.73_m2} — ABNORMAL LOW (ref >=60)
Globulin: 2.6 g/dL (calc) (ref 1.9–3.7)
Glucose, Bld: 81 mg/dL (ref 65–99)
Potassium: 3.8 mmol/L (ref 3.5–5.3)
Sodium: 137 mmol/L (ref 135–146)
Total Bilirubin: 0.5 mg/dL (ref 0.2–1.2)
Total Protein: 6.7 g/dL (ref 6.1–8.1)

## 2020-03-02 LAB — CA 125: CA 125: 5 U/mL (ref <35)

## 2020-03-02 LAB — IRON,TIBC AND FERRITIN PANEL
%SAT: 17 % (calc) (ref 16–45)
Ferritin: 69 ng/mL (ref 16–232)
Iron: 54 ug/dL (ref 45–160)
TIBC: 318 mcg/dL (calc) (ref 250–450)

## 2020-03-02 LAB — T4, FREE: Free T4: 1.1 ng/dL (ref 0.8–1.8)

## 2020-03-02 LAB — C-REACTIVE PROTEIN: CRP: 50.1 mg/L — ABNORMAL HIGH (ref <8.0)

## 2020-03-02 LAB — TSH: TSH: 3.9 mIU/L (ref 0.40–4.50)

## 2020-03-02 MED ORDER — DICYCLOMINE HCL 20 MG PO TABS: 20.0000 mg | ORAL_TABLET | Freq: Four times a day (QID) | ORAL | 0 refills | Status: DC | PRN

## 2020-03-02 MED ORDER — IOHEXOL 300 MG/ML  SOLN
100.0000 mL | Freq: Once | INTRAMUSCULAR | Status: AC | PRN
  Administered 2020-03-02: 100 mL via INTRAVENOUS

## 2020-03-02 NOTE — Telephone Encounter (Signed)
Please tell patient she has colonic imflammation but no obstruction or masses  . Blood count is normal but her inflammation markers are very  High . Not sure what is causing this   Could be infection or  Inflammatory colitis   Rest of lab are goiod.   Please get her into Gi tomorrow  Her reg gi doc is dr Carlean Purl

## 2020-03-02 NOTE — Telephone Encounter (Signed)
Please see other phone note-I spoke to the patient and worked out a plan

## 2020-03-02 NOTE — Progress Notes (Signed)
Only unusual finding is very elevated crp  forwarding  results to your team   Thyroid is ok

## 2020-03-02 NOTE — Telephone Encounter (Signed)
Clinic RN received a call from [place] at [facility] Rad[facility]. CT of Abd/Pelvis w/contrast shows infectious/inflammatory colonic wall thickening involving splenic flexure, descending colon, and rectosigmoid colon. Indeterminate top-normal appendix caliber without adjacent inflammatory changes

## 2020-03-02 NOTE — Progress Notes (Signed)
So Ct shows colitis  dx : uncertain which type  infectious or inflammatory...    asking dr Carlean Purl team to get involved  if we need to add antibiotic  before they  can see you. Good news is that  no obstruction or masses .   Your wbc  and thyroid are normal but  inflammation marker is high . Stay on  liquids   Please get her appt with Gi team tomorrow or  depending on  advice

## 2020-03-02 NOTE — Telephone Encounter (Signed)
seeupdated OV

## 2020-03-02 NOTE — Telephone Encounter (Signed)
Received communication from Dr. Regis Bill about change in bowel habits and left-sided colitis on CT scan.  Asking about working the patient in tomorrow.  I called [name].  About 6 days ago she had rather intense lower abdominal cramping and frequent mucoid stools she felt a little better and then had some recurrent symptoms and saw Dr. Regis Bill.  Lab Results  Component Value Date   WBC 9.3 03/01/2020   HGB 15.4 03/01/2020   HCT 45.6 (H) 03/01/2020   MCV 88.4 03/01/2020   PLT 235 03/01/2020   Lab Results  Component Value Date   CREATININE 1.07 (H) 03/01/2020   BUN 27 (H) 03/01/2020   NA 137 03/01/2020   K 3.8 03/01/2020   CL 100 03/01/2020   CO2 29 03/01/2020    CT scan was ordered and showed a left-sided colitis.  Splenic flexure descending and rectosigmoid.  The patient reports that she actually is feeling better she had some diarrhea with the CT contrast.  She did not get constipated before all this started but she tends towards constipation but moves her bowels most days.  She might of had some flecks of blood in the mucoid stool initially but when she was checked in the office she reports that she was heme-negative there was a question about whether the reagent was expired.  At any rate she has not had major hematochezia or rectal bleeding other than a little bit of blood on the tissue paper after the diarrhea associated with the CT contrast.  My sense is she probably has had an episode of ischemic colitis.  Agree with Dr. Regis Bill that she is not having watery diarrhea consistent with C. Difficile.  We do not have an opening tomorrow.  I am off later today and tomorrow not available.  Given that the patient is reporting clinical improvement and with my clinical impression the plan is as follows:  Dicyclomine 20 mg every 6 hours as needed  If she gets severe pain bleeding etc. call our number for guidance versus going to the ED  She will give me an update via MyChart or phone call on  Monday or call back sooner as needed.  She is due for a routine colonoscopy this next summer, but I suspect we may go ahead and schedule that earlier given these changes.  We will determine that when we regroup next week.

## 2020-03-06 DIAGNOSIS — H26491 Other secondary cataract, right eye: Secondary | ICD-10-CM | POA: Diagnosis not present

## 2020-03-10 ENCOUNTER — Inpatient Hospital Stay: Payer: Medicare Other | Attending: Hematology & Oncology

## 2020-03-10 ENCOUNTER — Other Ambulatory Visit: Payer: Self-pay

## 2020-03-10 DIAGNOSIS — Z859 Personal history of malignant neoplasm, unspecified: Secondary | ICD-10-CM | POA: Insufficient documentation

## 2020-03-10 DIAGNOSIS — G63 Polyneuropathy in diseases classified elsewhere: Secondary | ICD-10-CM

## 2020-03-10 DIAGNOSIS — C801 Malignant (primary) neoplasm, unspecified: Secondary | ICD-10-CM

## 2020-03-10 DIAGNOSIS — Z95828 Presence of other vascular implants and grafts: Secondary | ICD-10-CM

## 2020-03-10 DIAGNOSIS — Z452 Encounter for adjustment and management of vascular access device: Secondary | ICD-10-CM | POA: Insufficient documentation

## 2020-03-10 DIAGNOSIS — D500 Iron deficiency anemia secondary to blood loss (chronic): Secondary | ICD-10-CM

## 2020-03-10 MED ORDER — SODIUM CHLORIDE 0.9% FLUSH
10.0000 mL | INTRAVENOUS | Status: DC | PRN
  Administered 2020-03-10: 10 mL
  Filled 2020-03-10: qty 10

## 2020-03-10 MED ORDER — HEPARIN SOD (PORK) LOCK FLUSH 100 UNIT/ML IV SOLN
500.0000 [IU] | Freq: Once | INTRAVENOUS | Status: AC | PRN
  Administered 2020-03-10: 500 [IU]
  Filled 2020-03-10: qty 5

## 2020-03-10 NOTE — Patient Instructions (Signed)

## 2020-03-13 ENCOUNTER — Telehealth: Payer: Self-pay | Admitting: Internal Medicine

## 2020-03-13 DIAGNOSIS — H35362 Drusen (degenerative) of macula, left eye: Secondary | ICD-10-CM | POA: Diagnosis not present

## 2020-03-13 DIAGNOSIS — H43393 Other vitreous opacities, bilateral: Secondary | ICD-10-CM | POA: Diagnosis not present

## 2020-03-13 DIAGNOSIS — H15833 Staphyloma posticum, bilateral: Secondary | ICD-10-CM | POA: Diagnosis not present

## 2020-03-13 DIAGNOSIS — H35341 Macular cyst, hole, or pseudohole, right eye: Secondary | ICD-10-CM | POA: Diagnosis not present

## 2020-03-13 NOTE — Telephone Encounter (Signed)
Patient calling states she has Colitis and is having mucus come out when she has a BM and some blood over the weekend and today its just mucus please advise

## 2020-03-13 NOTE — Telephone Encounter (Signed)
Patient was asked to call back with an update.  Dr. Carlean Purl does she need any additional testing?

## 2020-03-14 NOTE — Telephone Encounter (Signed)
Patient notified of the recommendations She is scheduled for colon and pre-visit

## 2020-03-14 NOTE — Telephone Encounter (Signed)
Please arrange for a colonoscopy  Diagnosis colitis and hematochezia  Also tell her I am not terribly surprised she is experiencing a little bit of this based upon what we know  I think if she is able we could do her Friday at 230 otherwise perhaps the week after

## 2020-03-15 ENCOUNTER — Encounter: Payer: Medicare Other | Attending: Psychology | Admitting: Psychology

## 2020-03-15 DIAGNOSIS — R4189 Other symptoms and signs involving cognitive functions and awareness: Secondary | ICD-10-CM | POA: Insufficient documentation

## 2020-03-15 DIAGNOSIS — C801 Malignant (primary) neoplasm, unspecified: Secondary | ICD-10-CM | POA: Insufficient documentation

## 2020-03-15 DIAGNOSIS — G63 Polyneuropathy in diseases classified elsewhere: Secondary | ICD-10-CM | POA: Insufficient documentation

## 2020-03-15 DIAGNOSIS — F331 Major depressive disorder, recurrent, moderate: Secondary | ICD-10-CM | POA: Insufficient documentation

## 2020-03-15 DIAGNOSIS — R29818 Other symptoms and signs involving the nervous system: Secondary | ICD-10-CM | POA: Insufficient documentation

## 2020-03-16 ENCOUNTER — Other Ambulatory Visit: Payer: Self-pay

## 2020-03-16 ENCOUNTER — Ambulatory Visit: Payer: Medicare Other

## 2020-03-16 ENCOUNTER — Encounter: Payer: Self-pay | Admitting: Internal Medicine

## 2020-03-16 VITALS — Ht 68.0 in | Wt 221.6 lb

## 2020-03-16 DIAGNOSIS — K5190 Ulcerative colitis, unspecified, without complications: Secondary | ICD-10-CM

## 2020-03-16 NOTE — Progress Notes (Signed)
Denies allergies to eggs or soy products. Denies complication of anesthesia or sedation. Denies use of weight loss medication. Denies use of O2.   Emmi instructions given for colonoscopy.  Patient has completed Covid vaccinations. She also had a booster in 9/21.

## 2020-03-20 ENCOUNTER — Encounter: Payer: Self-pay | Admitting: Internal Medicine

## 2020-03-20 ENCOUNTER — Other Ambulatory Visit: Payer: Self-pay

## 2020-03-20 ENCOUNTER — Telehealth: Payer: Self-pay | Admitting: Internal Medicine

## 2020-03-20 ENCOUNTER — Ambulatory Visit (AMBULATORY_SURGERY_CENTER): Payer: Medicare Other | Admitting: Internal Medicine

## 2020-03-20 VITALS — BP 137/84 | HR 73 | Temp 97.1°F | Resp 14 | Ht 68.0 in | Wt 221.0 lb

## 2020-03-20 DIAGNOSIS — K635 Polyp of colon: Secondary | ICD-10-CM | POA: Diagnosis not present

## 2020-03-20 DIAGNOSIS — K5190 Ulcerative colitis, unspecified, without complications: Secondary | ICD-10-CM | POA: Diagnosis not present

## 2020-03-20 DIAGNOSIS — K6389 Other specified diseases of intestine: Secondary | ICD-10-CM | POA: Diagnosis not present

## 2020-03-20 DIAGNOSIS — K921 Melena: Secondary | ICD-10-CM | POA: Diagnosis not present

## 2020-03-20 DIAGNOSIS — D123 Benign neoplasm of transverse colon: Secondary | ICD-10-CM

## 2020-03-20 DIAGNOSIS — K529 Noninfective gastroenteritis and colitis, unspecified: Secondary | ICD-10-CM

## 2020-03-20 DIAGNOSIS — K51919 Ulcerative colitis, unspecified with unspecified complications: Secondary | ICD-10-CM

## 2020-03-20 MED ORDER — SODIUM CHLORIDE 0.9 % IV SOLN: 500.0000 mL | Freq: Once | INTRAVENOUS | Status: DC

## 2020-03-20 NOTE — Progress Notes (Signed)
Called to room to assist during endoscopic procedure.  Patient ID and intended procedure confirmed with present staff. Received instructions for my participation in the procedure from the performing physician.  

## 2020-03-20 NOTE — Telephone Encounter (Signed)
Pt is scheduled to see Dr. Carlean Purl today at 1330. Pt stated that she was concerned because she was still going to the rest room after completing second prep. Advised patient that it is normal for patients to still be going to restroom after completing prep. Advised patient to drink as much fluids as she can tolerate until 1030 when she is supposed to stop drinking liquids. Also advised patient to call [facility] back if stool does not clear up, and provided numbers to call. Pt verbalized understanding.

## 2020-03-20 NOTE — Progress Notes (Signed)
Pt's states no medical or surgical changes since previsit or office visit. 

## 2020-03-20 NOTE — Patient Instructions (Addendum)
The colitis is all healed up.  My sense is it was probably will recall ischemic colitis where a spasm clamps down on the blood flow to the colon temporarily.  Infection is also a possibility.  The bottom line is it all healed.  I did find and remove one very tiny polyp.  I will get that analyzed and let you know the results.  I am not concerned about it at all.  I hope you continue to feel better.  I appreciate the opportunity to care for you. Gatha Mayer, MD, FACG YOU HAD AN ENDOSCOPIC PROCEDURE TODAY AT [facility]:   Refer to the procedure report that was given to you for any specific questions about what was found during the examination.  If the procedure report does not answer your questions, please call your gastroenterologist to clarify.  If you requested that your care partner not be given the details of your procedure findings, then the procedure report has been included in a sealed envelope for you to review at your convenience later.  YOU SHOULD EXPECT: Some feelings of bloating in the abdomen. Passage of more gas than usual.  Walking can help get rid of the air that was put into your GI tract during the procedure and reduce the bloating. If you had a lower endoscopy (such as a colonoscopy or flexible sigmoidoscopy) you may notice spotting of blood in your stool or on the toilet paper. If you underwent a bowel prep for your procedure, you may not have a normal bowel movement for a few days.  Please Note:  You might notice some irritation and congestion in your nose or some drainage.  This is from the oxygen used during your procedure.  There is no need for concern and it should clear up in a day or so.  SYMPTOMS TO REPORT IMMEDIATELY:   Following lower endoscopy (colonoscopy or flexible sigmoidoscopy):  Excessive amounts of blood in the stool  Significant tenderness or worsening of abdominal pains  Swelling of the abdomen that is new, acute  Fever of 100F or  higher   For urgent or emergent issues, a gastroenterologist can be reached at any hour by calling [phone]. Do not use MyChart messaging for urgent concerns.     MEDICATIONS: Continue present medications.  Please see handouts given to you by your recovery nurse.  ACTIVITY:  You should plan to take it easy for the rest of today and you should NOT DRIVE or use heavy machinery until tomorrow (because of the sedation medicines used during the test).    FOLLOW UP: Our staff will call the number listed on your records 48-72 hours following your procedure to check on you and address any questions or concerns that you may have regarding the information given to you following your procedure. If we do not reach you, we will leave a message.  We will attempt to reach you two times.  During this call, we will ask if you have developed any symptoms of COVID 19. If you develop any symptoms (ie: fever, flu-like symptoms, shortness of breath, cough etc.) before then, please call [phone].  If you test positive for Covid 19 in the 2 weeks post procedure, please call and report this information to [place].    If any biopsies were taken you will be contacted by phone or by letter within the next 1-3 weeks.  Please call us at [phone] if you have not heard about the biopsies in  3 weeks.   Thank you for allowing [place] to provide for your healthcare needs today.   SIGNATURES/CONFIDENTIALITY: You and/or your care partner have signed paperwork which will be entered into your electronic medical record.  These signatures attest to the fact that that the information above on your After Visit Summary has been reviewed and is understood.  Full responsibility of the confidentiality of this discharge information lies with you and/or your care-partner.

## 2020-03-20 NOTE — Op Note (Signed)
[facility] Patient Name: [name] Procedure Date: 03/20/2020 1:37 PM MRN: [id number] Endoscopist: Gatha Mayer , MD Age: 58 Referring MD:  Date of Birth: [birth year] Gender: Female Account #: [id number] Procedure:                Colonoscopy Indications:              Hematochezia, Abnormal CT of the GI tract Medicines:                Propofol per Anesthesia, Monitored Anesthesia Care Procedure:                Pre-Anesthesia Assessment:                           - Prior to the procedure, a History and Physical                            was performed, and patient medications and                            allergies were reviewed. The patient's tolerance of                            previous anesthesia was also reviewed. The risks                            and benefits of the procedure and the sedation                            options and risks were discussed with the patient.                            All questions were answered, and informed consent                            was obtained. Prior Anticoagulants: The patient has                            taken no previous anticoagulant or antiplatelet                            agents. ASA Grade Assessment: II - A patient with                            mild systemic disease. After reviewing the risks                            and benefits, the patient was deemed in                            satisfactory condition to undergo the procedure.                           After obtaining informed consent, the colonoscope  was passed under direct vision. Throughout the                            procedure, the patient's blood pressure, pulse, and                            oxygen saturations were monitored continuously. The                            Colonoscope was introduced through the anus and                            advanced to the the cecum, identified by                             appendiceal orifice and ileocecal valve. The                            colonoscopy was performed without difficulty. The                            patient tolerated the procedure well. The quality                            of the bowel preparation was good. The bowel                            preparation used was Miralax via split dose                            instruction. The ileocecal valve, appendiceal                            orifice, and rectum were photographed. Scope In: 1:45:09 PM Scope Out: 2:05:12 PM Scope Withdrawal Time: 0 hours 13 minutes 10 seconds  Total Procedure Duration: 0 hours 20 minutes 3 seconds  Findings:                 The perianal and digital rectal examinations were                            normal.                           A diminutive polyp was found in the distal                            transverse colon. The polyp was sessile. The polyp                            was removed with a cold snare. Resection and                            retrieval were complete. Verification of patient  identification for the specimen was done. Estimated                            blood loss was minimal.                           The exam was otherwise without abnormality on                            direct and retroflexion views. Complications:            No immediate complications. Estimated Blood Loss:     Estimated blood loss was minimal. Impression:               - One diminutive polyp in the distal transverse                            colon, removed with a cold snare. Resected and                            retrieved.                           - The examination was otherwise normal on direct                            and retroflexion views. I THINK RECENT SYNDROME                            MOST LIKELY WAS NON-OCCLUSIVE ISCHEMIC COLITIS THAT                            HAS HEALED Recommendation:           - Patient has a contact  number available for                            emergencies. The signs and symptoms of potential                            delayed complications were discussed with the                            patient. Return to normal activities tomorrow.                            Written discharge instructions were provided to the                            patient.                           - Resume previous diet.                           - Continue present medications.                           -  Repeat colonoscopy is recommended. The                            colonoscopy date will be determined after pathology                            results from today's exam become available for                            review. Gatha Mayer, MD 03/20/2020 2:17:20 PM This report has been signed electronically.

## 2020-03-20 NOTE — Progress Notes (Signed)
A/ox3, pleased with MAC, report to RN 

## 2020-03-22 ENCOUNTER — Telehealth: Payer: Self-pay | Admitting: *Deleted

## 2020-03-22 ENCOUNTER — Telehealth: Payer: Self-pay

## 2020-03-22 NOTE — Telephone Encounter (Signed)
  Follow up Call-  Call back number 03/20/2020 07/14/2018  Post procedure Call Back phone  # [phone]  Permission to leave phone message Yes Yes  Some recent data might be hidden     Patient questions:  Do you have a fever, pain , or abdominal swelling? No. Pain Score  0 *  Have you tolerated food without any problems? Yes.  Have you been able to return to your normal activities? Yes.    Do you have any questions about your discharge instructions: Diet   No. Medications  No. Follow up visit  No.  Do you have questions or concerns about your Care? No.  Actions: * If pain score is 4 or above: No action needed, pain <4.  1. Have you developed a fever since your procedure? no  2.   Have you had an respiratory symptoms (SOB or cough) since your procedure? no  3.   Have you tested positive for COVID 19 since your procedure no  4.   Have you had any family members/close contacts diagnosed with the COVID 19 since your procedure?  no   If yes to any of these questions please route to Joylene John, RN and Joella Prince, RN

## 2020-03-22 NOTE — Telephone Encounter (Signed)
First post procedure follow up call, no answer 

## 2020-03-27 ENCOUNTER — Telehealth: Payer: Self-pay | Admitting: Neurology

## 2020-03-27 ENCOUNTER — Other Ambulatory Visit: Payer: Self-pay | Admitting: Neurology

## 2020-03-27 MED ORDER — AMPHETAMINE-DEXTROAMPHET ER 20 MG PO CP24: 20.0000 mg | ORAL_CAPSULE | Freq: Every day | ORAL | 0 refills | Status: DC

## 2020-03-27 NOTE — Telephone Encounter (Signed)
I have routed this request to Dr Brett Fairy for review. The pt is due for the medication and [place] registry was verified.

## 2020-03-27 NOTE — Telephone Encounter (Signed)
Pt is requesting a refill for amphetamine-dextroamphetamine (ADDERALL XR) 20 MG 24 hr capsule .  Pharmacy: [facility] [phone]

## 2020-03-29 ENCOUNTER — Encounter (HOSPITAL_BASED_OUTPATIENT_CLINIC_OR_DEPARTMENT_OTHER): Payer: Medicare Other | Admitting: Psychology

## 2020-03-29 ENCOUNTER — Other Ambulatory Visit: Payer: Self-pay

## 2020-03-29 DIAGNOSIS — R29818 Other symptoms and signs involving the nervous system: Secondary | ICD-10-CM

## 2020-03-29 DIAGNOSIS — G63 Polyneuropathy in diseases classified elsewhere: Secondary | ICD-10-CM

## 2020-03-29 DIAGNOSIS — F331 Major depressive disorder, recurrent, moderate: Secondary | ICD-10-CM | POA: Diagnosis not present

## 2020-03-29 DIAGNOSIS — R4189 Other symptoms and signs involving cognitive functions and awareness: Secondary | ICD-10-CM

## 2020-03-29 DIAGNOSIS — C801 Malignant (primary) neoplasm, unspecified: Secondary | ICD-10-CM | POA: Diagnosis not present

## 2020-03-30 ENCOUNTER — Telehealth (INDEPENDENT_AMBULATORY_CARE_PROVIDER_SITE_OTHER): Payer: Medicare Other | Admitting: Internal Medicine

## 2020-03-30 ENCOUNTER — Encounter: Payer: Self-pay | Admitting: Psychology

## 2020-03-30 ENCOUNTER — Encounter: Payer: Self-pay | Admitting: Internal Medicine

## 2020-03-30 DIAGNOSIS — Z79899 Other long term (current) drug therapy: Secondary | ICD-10-CM | POA: Diagnosis not present

## 2020-03-30 DIAGNOSIS — R21 Rash and other nonspecific skin eruption: Secondary | ICD-10-CM | POA: Diagnosis not present

## 2020-03-30 DIAGNOSIS — L282 Other prurigo: Secondary | ICD-10-CM | POA: Diagnosis not present

## 2020-03-30 MED ORDER — PREDNISONE 20 MG PO TABS: ORAL_TABLET | ORAL | 0 refills | Status: DC

## 2020-03-30 NOTE — Progress Notes (Signed)
Neuropsychology Visit  Patient:  [name]   DOB: 1960/05/19  MR Number: [id number]  Location: [facility] [place], [place] [id number] [place] [address] Dept: [phone]  Date of Service: 03/29/2020  Start: 10 AM End: 11 AM  Today's visit was a 1 hour in person visit that was conducted in my outpatient clinic office with the patient myself present.  Duration of Service: 1 Hour  Provider/Observer:     Edgardo Roys PsyD  Chief Complaint:      Chief Complaint  Patient presents with  . Pain  . 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  The patient has had residual cognitive changes similar to possible impacts of chemotherapy as well as significant residual pain symptoms.  The patient was diagnosed and treated for cancer 10 years ago and continues to have ongoing follow-up regarding possible recurrence.  The patient has had neuropathy, insomnia and memory loss post chemotherapy.  The patient has depressive symptoms that have developed and has a hard time motivating and a hard time keeping focused on life and staying engaged.  The patient has continued struggle with issues related to coping and adjustment with pain subsequent to cognitive deficits and residual effects from her chemotherapy treatment.  The patient has had a lot of recent stressors and recent GI distress that is raised increased fears about recurrence of her cancer.  Treatment Interventions:  Therapeutic interventions for issues associated with chronic pain, adjustment issues, depression and cognitive  difficulties following a diagnosis and treatment for cancer which was primarily ovarian in nature.  Participation Level:   Active  Participation Quality:  Appropriate and Attentive      Behavioral Observation:  Well Groomed, Alert, and Appropriate.   Current Psychosocial Factors: The patient is still coping with being able to get herself organized enough to follow through on basic day-to-day tasks at home.  The patient is struggling with medical issues that are causing her a lot of stress including some symptoms with skin changes that she is seeing a dermatologist for coming up.   Content of Session:   Reviewed current symptoms and worked on therapeutic interventions around issues of depression, cognitive difficulties and residual post chemotherapy.  Effectiveness of Interventions: The patient was very active and open during the visit we had an effective appointment.  Target Goals:   Target goals include working on building coping skills and strategies around issues of depression and residual effects of her chemotherapy including residual cognitive deficits.  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  The patient has had residual cognitive changes similar to possible impacts of chemotherapy as well as significant residual pain symptoms.  The patient was diagnosed and treated for cancer 10 years ago and continues to have ongoing follow-up regarding possible recurrence.  The patient has had neuropathy, insomnia and memory loss  post chemotherapy.  The patient has depressive symptoms that have developed and has a hard time motivating and a hard time keeping focused on life and staying  engaged.  The patient describes depressive symptoms, including motivation, feelings of helplessness and hopelessness and anxiety about recurrence of her cancer.  The patient also describes ongoing chronic pain symptoms and neuropathy as well as insomnia and memory loss.  Today we worked specifically on behavioral adaptations and building coping skills around issues of her difficulty completing necessary daily activities and daily requirements as her cognitive change in cognitive impairments and impact they have had on emotional status and self-esteem issues are having a very significant deleterious effect on her.  We will continue to work on therapeutic interventions around coping skills and strategies with regard to her depression and residual effects of her cognitive symptoms and pain symptoms post chemotherapy.  Diagnosis:   Neuropathy associated with cancer [facility])  Neurocognitive deficits  Major depressive disorder, recurrent episode, moderate (HCC)    Ilean Skill, Psy.D. Clinical Psychologist Neuropsychologist

## 2020-03-30 NOTE — Progress Notes (Signed)
Virtual Visit via Video Note  I connected with@ on 03/30/20 at 11:00 AM EST by a video enabled telemedicine application and verified that I am speaking with the correct person using two identifiers. Location patient: home Location provider:home office Persons participating in the virtual visit: patient, provider  [place] national recommendations  regarding COVID 19 pandemic   video visit is advised over in office visit for this patient.  Patient aware  of the limitations of evaluation and management by telemedicine and  availability of in person appointments. and agreed to proceed.   HPI: [name] presents for video visit sda She had been having a bumpy rash on the top of her feet but in the last 1 to 2 days she dates that she has had itchy uncomfortable rash that exploded.  States there are 3 types of rash one scaly itchy 2 bumps that hurt and itch but under the skin and 3 [place].  Today she also has what feels like it is in her eye lid no redness but wants to scratch the area No known exposures no new medicines except for Adderall from the neurologist. Tried some Valtrex leftover in her medicine cabinet coconut oil and may be a dose of prednisone which may have helped her comfort. There is no respiratory symptoms fever or other systemic symptoms. No exposures that she is aware Does not seem like scabies to her as she had it in the past remotely fairly localized.  Has a remote history of dermatologic evaluation but none recently.   May have had ischemic colitis recently  ROS: See pertinent positives and negatives per HPI. Relates that when she was in school and had to wear knee-high's she had an itchy reaction that turned into blisters and ulcers when she went had to wear knee-high's.  No known specific allergy contact diagnosis. Past Medical History:  Diagnosis Date  . 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 Adjustment disorder   . Allergic  rhinitis   . Allergy   . Alopecia   . Anxiety and depression   . Arthritis   . Blood transfusion without reported diagnosis   . 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 Clotting disorder ([place])   . GERD (gastroesophageal reflux disease)   . HLD (hyperlipidemia)   . Hx pulmonary embolism   . Hypertension   . Iron deficiency anemia due to chronic blood loss 11/04/2016  . Metrorrhagia   . Neuromuscular disorder ([place])   . Neuropathy   . Obesity   . Ovarian cancer ([place]) 04/2008   ChemoTherapy  . 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 Raynaud's syndrome   . Right groin mass   . Squamous cell carcinoma of skin   . Ulcerative colitis ([place])   . Vision abnormalities   . Vitamin D deficiency     Past Surgical History:  Procedure Laterality Date  . ABDOMINAL HYSTERECTOMY  05/2008   TAH/BSO  . AUGMENTATION MAMMAPLASTY Bilateral 10+ years  . BREAST BIOPSY Left 2020  . BREAST BIOPSY Right   . BREAST REDUCTION SURGERY  2000  . CESAREAN SECTION     1991  . COLONOSCOPY  2004; 12/07/10   hemorrhoids  . ESOPHAGOGASTRODUODENOSCOPY    . GASTRIC BYPASS  1979  . REDUCTION MAMMAPLASTY Bilateral   . TOENAIL EXCISION Bilateral   . TONSILLECTOMY      Family History  Problem Relation Age of Onset  . Pulmonary embolism Mother   . Hypertension Father   .  Breast cancer Maternal Grandmother        diagnosed in early 60s  . Hyperlipidemia Other   . Breast cancer Sister 7  . Colon cancer Neg Hx   . Esophageal cancer Neg Hx   . Rectal cancer Neg Hx   . Stomach cancer Neg Hx     Social History   Tobacco Use  . Smoking status: Current Every Day Smoker    Packs/day: 1.00    Years: 37.00    Pack years: 37.00    Types: Cigarettes    Start date: 06/16/1978  . Smokeless tobacco: Never Used  . Tobacco comment: 2- 7-16  STILL SMOKING  Vaping Use  . Vaping Use: Never used  Substance Use Topics  . Alcohol use: Yes    Alcohol/week: 0.0 standard drinks    Comment: rare, 3 weekly  . Drug use:  No      Current Outpatient Medications:  .  amphetamine-dextroamphetamine (ADDERALL XR) 20 MG 24 hr capsule, Take 1 capsule (20 mg total) by mouth daily., Disp: 30 capsule, Rfl: 0 .  aspirin 81 MG tablet, Take 81 mg by mouth daily. 2 TABS IN AM, Disp: , Rfl:  .  calcium carbonate (TUMS - DOSED IN MG ELEMENTAL CALCIUM) 500 MG chewable tablet, Chew 1 tablet by mouth daily as needed for indigestion or heartburn., Disp: , Rfl:  .  diclofenac (VOLTAREN) 75 MG EC tablet, TK 1 T PO BID AFTER MEALS FOR INFLAMMATION / PAIN / SWELLING PRN ONLY, Disp: , Rfl: 1 .  donepezil (ARICEPT) 10 MG tablet, Take 1 tablet (10 mg total) by mouth every morning., Disp: 90 tablet, Rfl: 2 .  DULoxetine (CYMBALTA) 60 MG capsule, TAKE 1 CAPSULE BY MOUTH DAILY, Disp: 90 capsule, Rfl: 0 .  Multiple Vitamin (MULTIVITAMIN WITH MINERALS) TABS tablet, Take 1 tablet by mouth daily., Disp: , Rfl:  .  potassium chloride (KLOR-CON) 10 MEQ tablet, TAKE 3 TABLETS BY MOUTH THREE TIMES DAILY. PLEASE SCHEDULE YEARLY VISIT FOR REFILLS, Disp: 270 tablet, Rfl: 0 .  pregabalin (LYRICA) 200 MG capsule, TAKE 1 CAPSULE(200 MG) BY MOUTH TWICE DAILY, Disp: 60 capsule, Rfl: 3 .  simvastatin (ZOCOR) 40 MG tablet, TAKE 1 TABLET BY MOUTH DAILY, Disp: 90 tablet, Rfl: 0 .  triamterene-hydrochlorothiazide (MAXZIDE-25) 37.5-25 MG tablet, TAKE 2 TABLETS BY MOUTH DAILY, Disp: 180 tablet, Rfl: 1 .  Vitamin D, Ergocalciferol, (DRISDOL) 1.25 MG (50000 UNIT) CAPS capsule, TAKE 1 CAPSULE BY MOUTH 1 TIME A WEEK, Disp: 13 capsule, Rfl: 2 .  zolpidem (AMBIEN) 5 MG tablet, Take 1-2 tablets (5-10 mg total) by mouth at bedtime as needed., Disp: 180 tablet, Rfl: 0 .  butalbital-acetaminophen-caffeine (FIORICET, ESGIC) 50-325-40 MG tablet, TAKE 1 TABLET BY MOUTH EVERY 6 HOURS AS NEEDED FOR HEADACHE (Patient not taking: Reported on 03/20/2020), Disp: 10 tablet, Rfl: 3 .  dicyclomine (BENTYL) 20 MG tablet, Take 1 tablet (20 mg total) by mouth every 6 (six) hours as needed  for spasms (Abdominal pain/cramps). (Patient not taking: Reported on 03/30/2020), Disp: 60 tablet, Rfl: 0 .  orphenadrine (NORFLEX) 100 MG tablet, Take 1 tablet (100 mg total) by mouth 2 (two) times daily as needed for muscle spasms. Schedule follow up visit for more refills. [phone], Disp: 180 tablet, Rfl: 0 .  polyethylene glycol powder (GLYCOLAX/MIRALAX) 17 GM/SCOOP powder, Take 1 Container by mouth once. (Patient not taking: Reported on 03/30/2020), Disp: , Rfl:  .  predniSONE (DELTASONE) 20 MG tablet, Take 3,3,3,2,2,2,1,1,1, 1/.2 1./2 1/.2 pills  Per day,  Disp: 24 tablet, Rfl: 0  Current Facility-Administered Medications:  .  0.9 %  sodium chloride infusion, 500 mL, Intravenous, Once, Gatha Mayer, MD  EXAM: BP Readings from Last 3 Encounters:  03/20/20 137/84  03/01/20 138/84  02/09/20 121/90    VITALS per patient if applicable:  GENERAL: alert, oriented, appears well and in no acute distress  HEENT: atraumatic, conjunttiva clear, no obvious abnormalities on inspection of external nose and ears  NECK: normal movements of the head and neck  LUNGS: on inspection no signs of respiratory distress, breathing rate appears normal, no obvious gross SOB, gasping or wheezing  CV: no obvious cyanosis Skin areas in sunlight and outside limited but can be seen around the ankles and feet almost a nodular rash is going up to the mid shin.  Then some tiny welt-like lines on her wrist and arms and otherwise small papular bumps that are discrete.  None on face no edema no obvious vesicle.  She describes no burrows.  MS: moves all visible extremities without noticeable abnormality  PSYCH/NEURO: pleasant and cooperative, no obvious depression or anxiety, speech and thought processing grossly intact Lab Results  Component Value Date   WBC 9.3 03/01/2020   HGB 15.4 03/01/2020   HCT 45.6 (H) 03/01/2020   PLT 235 03/01/2020   GLUCOSE 81 03/01/2020   CHOL 185 02/09/2019   TRIG 189.0 (H)  02/09/2019   HDL 45.50 02/09/2019   LDLDIRECT 98.7 09/04/2009   LDLCALC 101 (H) 02/09/2019   ALT 25 03/01/2020   AST 24 03/01/2020   NA 137 03/01/2020   K 3.8 03/01/2020   CL 100 03/01/2020   CREATININE 1.07 (H) 03/01/2020   BUN 27 (H) 03/01/2020   CO2 29 03/01/2020   TSH 3.90 03/01/2020   INR 0.86 06/02/2013   HGBA1C 6.2 02/09/2019    ASSESSMENT AND PLAN:  Discussed the following assessment and plan:    ICD-10-CM   1. Diffuse papular rash  R21 Ambulatory referral to Dermatology  2. Pruritic rash  L28.2 Ambulatory referral to Dermatology    take pix of the 3 forms of rash  pred and antihistamine   Derm appt   Atypical rashes   Disc diff dx  Scabies  ( not typical as she has had  Before)  No atheletes feet and no sig new meds .  Past hx of what sounds like sensitive skin ? ec  As a child.  No hx  Of psoriasis?  Uncertain cause of her rashes but has a somewhat exploded and I do not think it is infection at this time we will proceed with prednisone taper and have her use antihistamines local cool gentle skin cleaning Dermatology referral as soon as possible Counseled.   Expectant management and discussion of plan and treatment with opportunity to ask questions and all were answered. The patient agreed with the plan and demonstrated an understanding of the instructions.   Advised to call back or seek an in-person evaluation if worsening  or having  further concerns . Return if symptoms worsen or fail to improve as expected, for derm referral for help.    Shanon Ace, MD

## 2020-04-03 ENCOUNTER — Other Ambulatory Visit: Payer: Self-pay | Admitting: Internal Medicine

## 2020-04-12 ENCOUNTER — Encounter: Payer: Medicare Other | Admitting: Psychology

## 2020-04-19 ENCOUNTER — Inpatient Hospital Stay: Payer: Medicare Other | Attending: Hematology & Oncology

## 2020-04-19 ENCOUNTER — Inpatient Hospital Stay (HOSPITAL_BASED_OUTPATIENT_CLINIC_OR_DEPARTMENT_OTHER): Payer: Medicare Other | Admitting: Hematology & Oncology

## 2020-04-19 ENCOUNTER — Inpatient Hospital Stay: Payer: Medicare Other

## 2020-04-19 ENCOUNTER — Encounter: Payer: Self-pay | Admitting: Hematology & Oncology

## 2020-04-19 ENCOUNTER — Other Ambulatory Visit: Payer: Self-pay

## 2020-04-19 VITALS — Wt 204.0 lb

## 2020-04-19 VITALS — BP 124/92 | HR 75 | Temp 98.4°F | Resp 18

## 2020-04-19 DIAGNOSIS — G63 Polyneuropathy in diseases classified elsewhere: Secondary | ICD-10-CM

## 2020-04-19 DIAGNOSIS — Z8589 Personal history of malignant neoplasm of other organs and systems: Secondary | ICD-10-CM | POA: Diagnosis not present

## 2020-04-19 DIAGNOSIS — Z9221 Personal history of antineoplastic chemotherapy: Secondary | ICD-10-CM | POA: Insufficient documentation

## 2020-04-19 DIAGNOSIS — R109 Unspecified abdominal pain: Secondary | ICD-10-CM | POA: Diagnosis not present

## 2020-04-19 DIAGNOSIS — Z1502 Genetic susceptibility to malignant neoplasm of ovary: Secondary | ICD-10-CM

## 2020-04-19 DIAGNOSIS — D509 Iron deficiency anemia, unspecified: Secondary | ICD-10-CM | POA: Diagnosis not present

## 2020-04-19 DIAGNOSIS — Z95828 Presence of other vascular implants and grafts: Secondary | ICD-10-CM

## 2020-04-19 DIAGNOSIS — C801 Malignant (primary) neoplasm, unspecified: Secondary | ICD-10-CM

## 2020-04-19 DIAGNOSIS — D500 Iron deficiency anemia secondary to blood loss (chronic): Secondary | ICD-10-CM

## 2020-04-19 LAB — CBC WITH DIFFERENTIAL (CANCER CENTER ONLY)
Abs Immature Granulocytes: 0.02 10*3/uL (ref 0.00–0.07)
Basophils Absolute: 0 10*3/uL (ref 0.0–0.1)
Basophils Relative: 0 %
Eosinophils Absolute: 0.1 10*3/uL (ref 0.0–0.5)
Eosinophils Relative: 2 %
HCT: 43.1 % (ref 36.0–46.0)
Hemoglobin: 14 g/dL (ref 12.0–15.0)
Immature Granulocytes: 0 %
Lymphocytes Relative: 21 %
Lymphs Abs: 1.3 10*3/uL (ref 0.7–4.0)
MCH: 28.7 pg (ref 26.0–34.0)
MCHC: 32.5 g/dL (ref 30.0–36.0)
MCV: 88.3 fL (ref 80.0–100.0)
Monocytes Absolute: 0.4 10*3/uL (ref 0.1–1.0)
Monocytes Relative: 6 %
Neutro Abs: 4.5 10*3/uL (ref 1.7–7.7)
Neutrophils Relative %: 71 %
Platelet Count: 197 10*3/uL (ref 150–400)
RBC: 4.88 MIL/uL (ref 3.87–5.11)
RDW: 15.3 % (ref 11.5–15.5)
WBC Count: 6.3 10*3/uL (ref 4.0–10.5)
nRBC: 0 % (ref 0.0–0.2)

## 2020-04-19 LAB — CMP (CANCER CENTER ONLY)
ALT: 37 U/L (ref 0–44)
AST: 26 U/L (ref 15–41)
Albumin: 3.9 g/dL (ref 3.5–5.0)
Alkaline Phosphatase: 80 U/L (ref 38–126)
Anion gap: 7 (ref 5–15)
BUN: 27 mg/dL — ABNORMAL HIGH (ref 6–20)
CO2: 29 mmol/L (ref 22–32)
Calcium: 9.9 mg/dL (ref 8.9–10.3)
Chloride: 104 mmol/L (ref 98–111)
Creatinine: 0.91 mg/dL (ref 0.44–1.00)
GFR, Estimated: >60 mL/min (ref >60)
Glucose, Bld: 111 mg/dL — ABNORMAL HIGH (ref 70–99)
Potassium: 3.3 mmol/L — ABNORMAL LOW (ref 3.5–5.1)
Sodium: 140 mmol/L (ref 135–145)
Total Bilirubin: 0.4 mg/dL (ref 0.3–1.2)
Total Protein: 6.2 g/dL — ABNORMAL LOW (ref 6.5–8.1)

## 2020-04-19 MED ORDER — SODIUM CHLORIDE 0.9% FLUSH
10.0000 mL | Freq: Once | INTRAVENOUS | Status: AC
  Administered 2020-04-19: 10 mL via INTRAVENOUS
  Filled 2020-04-19: qty 10

## 2020-04-19 MED ORDER — HEPARIN SOD (PORK) LOCK FLUSH 100 UNIT/ML IV SOLN
500.0000 [IU] | Freq: Once | INTRAVENOUS | Status: AC
  Administered 2020-04-19: 500 [IU] via INTRAVENOUS
  Filled 2020-04-19: qty 5

## 2020-04-19 NOTE — Patient Instructions (Signed)

## 2020-04-19 NOTE — Progress Notes (Signed)
Hematology and Oncology Follow Up Visit  [name] [id number] [birth year] 59 y.o. 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  Overall, she is doing pretty well.  She did have some problems couple months ago with her abdomen.  She is having abdominal pain.  She ultimately underwent a CT scan of her abdomen and pelvis.  Looks like this showed some colitis.  She did not receive any type of chemotherapy.  Seemed as if the colitis improved on its own..    She also has had a colonoscopy.  This was done on 03/20/2020.  There was a tiny polyp that was found and removed.  She overall is doing okay.  She has this rash now.  It seems to be eczema.  She has not yet seen a dermatologist.  I think she saw her family doctor and got some steroids for this.  It seemed as if the steroids helped.  She still has her Port-A-Cath in.  We flushes every 2 months.  She has had no problems with cough.  She has been fully vaccinated.  She is not been able to see her son or granddaughter because they have not been vaccinated.  She has had no bleeding.  There is been no obvious headache..  She still sees a neurologist.  I think she is also seeing a neuropsychologist who is helping her out quite a bit.  She is try to exercise.  I know this is been quite difficult for her.  Overall, I would say her performance status is ECOG 0.   Medications:  Allergies as of 04/19/2020      Reactions   Codeine Nausea Only   Orange Itching, Rash      Medication List       Accurate as of April 19, 2020  5:19 PM. If you have any questions, ask your nurse or doctor.        STOP taking these medications   dicyclomine 20 MG tablet Commonly known as: BENTYL Stopped by: [name],  MD   polyethylene glycol powder 17 GM/SCOOP powder Commonly known as: GLYCOLAX/MIRALAX Stopped by: [name], MD   predniSONE 20 MG tablet Commonly known as: DELTASONE Stopped by: [name], MD     TAKE these medications   amphetamine-dextroamphetamine 20 MG 24 hr capsule Commonly known as: Adderall XR Take 1 capsule (20 mg total) by mouth daily.   aspirin 81 MG tablet Take 81 mg by mouth daily. 2 TABS IN AM   butalbital-acetaminophen-caffeine 50-325-40 MG tablet Commonly known as: FIORICET TAKE 1 TABLET BY MOUTH EVERY 6 HOURS AS NEEDED FOR HEADACHE   calcium carbonate 500 MG chewable tablet Commonly known as: TUMS - dosed in mg elemental calcium Chew 1 tablet by mouth daily as needed for indigestion or heartburn.   diclofenac 75 MG EC tablet Commonly known as: VOLTAREN TK 1 T PO BID AFTER MEALS FOR INFLAMMATION / PAIN / SWELLING PRN ONLY   donepezil 10 MG tablet Commonly known as: ARICEPT Take 1 tablet (10 mg total) by mouth every morning.   DULoxetine 60 MG capsule Commonly known as: CYMBALTA TAKE 1 CAPSULE BY MOUTH DAILY   multivitamin with minerals Tabs tablet Take 1 tablet by mouth daily.   orphenadrine 100 MG tablet Commonly known as: NORFLEX Take 1 tablet (100  mg total) by mouth 2 (two) times daily as needed for muscle spasms. Schedule follow up visit for more refills. [phone]   potassium chloride 10 MEQ tablet Commonly known as: KLOR-CON TAKE 3 TABLETS BY MOUTH THREE TIMES DAILY. PLEASE SCHEDULE YEARLY VISIT FOR REFILLS   pregabalin 200 MG capsule Commonly known as: LYRICA TAKE 1 CAPSULE(200 MG) BY MOUTH TWICE DAILY   simvastatin 40 MG tablet Commonly known as: ZOCOR TAKE 1 TABLET BY MOUTH DAILY   triamterene-hydrochlorothiazide 37.5-25 MG tablet Commonly known as: MAXZIDE-25 TAKE 2 TABLETS BY MOUTH DAILY   Vitamin D (Ergocalciferol) 1.25 MG (50000 UNIT) Caps capsule Commonly known as: DRISDOL TAKE 1 CAPSULE BY MOUTH 1 TIME A  WEEK   zolpidem 5 MG tablet Commonly known as: AMBIEN Take 1-2 tablets (5-10 mg total) by mouth at bedtime as needed.       Allergies:  Allergies  Allergen Reactions  . Codeine Nausea Only  . Orange Itching and Rash    Past Medical History, Surgical history, Social history, and Family History were reviewed and updated.  Review of Systems: Review of Systems  Constitutional: Negative.   HENT: Negative.   Eyes: Negative.   Respiratory: Negative.   Cardiovascular: Negative.   Gastrointestinal: Positive for abdominal pain.  Genitourinary: Negative.   Musculoskeletal: Negative.   Skin: Negative.   Neurological: Negative.   Endo/Heme/Allergies: Negative.   Psychiatric/Behavioral: Negative.       Physical Exam:  weight is 204 lb (92.5 kg).   Wt Readings from Last 3 Encounters:  04/19/20 204 lb (92.5 kg)  03/20/20 221 lb (100.2 kg)  03/16/20 221 lb 9.6 oz (100.5 kg)    Physical Exam Vitals reviewed.  HENT:     Head: Normocephalic and atraumatic.  Eyes:     Pupils: Pupils are equal, round, and reactive to light.  Cardiovascular:     Rate and Rhythm: Normal rate and regular rhythm.     Heart sounds: Normal heart sounds.  Pulmonary:     Effort: Pulmonary effort is normal.     Breath sounds: Normal breath sounds.  Abdominal:     General: Bowel sounds are normal.     Palpations: Abdomen is soft.  Musculoskeletal:        General: No tenderness or deformity. Normal range of motion.     Cervical back: Normal range of motion.  Lymphadenopathy:     Cervical: No cervical adenopathy.  Skin:    General: Skin is warm and dry.     Findings: No erythema or rash.  Neurological:     Mental Status: She is alert and oriented to person, place, and time.  Psychiatric:        Behavior: Behavior normal.        Thought Content: Thought content normal.        Judgment: Judgment normal.      Lab Results  Component Value Date   WBC 6.3 04/19/2020   HGB 14.0 04/19/2020    HCT 43.1 04/19/2020   MCV 88.3 04/19/2020   PLT 197 04/19/2020   Lab Results  Component Value Date   FERRITIN 69 03/01/2020   IRON 54 03/01/2020   TIBC 318 03/01/2020   UIBC 275 02/25/2019   IRONPCTSAT 17 03/01/2020   Lab Results  Component Value Date   RBC 4.88 04/19/2020   No results found for: KPAFRELGTCHN, LAMBDASER, KAPLAMBRATIO No results found for: IGGSERUM, IGA, IGMSERUM No results found for: TOTALPROTELP, ALBUMINELP, A1GS, A2GS, BETS, BETA2SER, [place], MSPIKE, SPEI  Chemistry      Component Value Date/Time   NA 140 04/19/2020 1505   NA 147 (H) 04/16/2017 1152   NA 139 12/27/2015 1156   K 3.3 (L) 04/19/2020 1505   K 3.4 04/16/2017 1152   K 3.9 12/27/2015 1156   CL 104 04/19/2020 1505   CL 101 04/16/2017 1152   CO2 29 04/19/2020 1505   CO2 30 04/16/2017 1152   CO2 27 12/27/2015 1156   BUN 27 (H) 04/19/2020 1505   BUN 18 04/16/2017 1152   BUN 14.9 12/27/2015 1156   CREATININE 0.91 04/19/2020 1505   CREATININE 1.07 (H) 03/01/2020 1626   CREATININE 0.9 12/27/2015 1156      Component Value Date/Time   CALCIUM 9.9 04/19/2020 1505   CALCIUM 9.8 04/16/2017 1152   CALCIUM 9.7 12/27/2015 1156   ALKPHOS 80 04/19/2020 1505   ALKPHOS 87 (H) 04/16/2017 1152   ALKPHOS 106 12/27/2015 1156   AST 26 04/19/2020 1505   AST 29 12/27/2015 1156   ALT 37 04/19/2020 1505   ALT 26 04/16/2017 1152   ALT 28 12/27/2015 1156   BILITOT 0.4 04/19/2020 1505   BILITOT 0.33 12/27/2015 1156       Impression and Plan: Ms. Tsutsui is very pleasant 59 yo [place] American female with a remote history of a poorly differentiated carcinoma which was felt to be ovarian, BRCA +. She completed 6 cycles of Carboplatin/Taxotere almost 11 years ago.   Forgot to mention that she had a mammogram in April of this year.  We will get her back in 6 months.  She needs her Port-A-Cath flush every 2 months.    [name], MD 12/8/20215:19 PM

## 2020-04-20 ENCOUNTER — Telehealth: Payer: Self-pay | Admitting: Hematology & Oncology

## 2020-04-20 LAB — IRON AND TIBC
Iron: 84 ug/dL (ref 41–142)
Saturation Ratios: 28 % (ref 21–57)
TIBC: 301 ug/dL (ref 236–444)
UIBC: 217 ug/dL (ref 120–384)

## 2020-04-20 LAB — CA 125: Cancer Antigen (CA) 125: 4.3 U/mL (ref 0.0–38.1)

## 2020-04-20 LAB — FERRITIN: Ferritin: 68 ng/mL (ref 11–307)

## 2020-04-20 NOTE — Telephone Encounter (Signed)
Appointments scheduled calendar printed & mailed per 12/8 los

## 2020-04-26 ENCOUNTER — Encounter: Payer: Medicare Other | Attending: Psychology | Admitting: Psychology

## 2020-04-26 DIAGNOSIS — R29818 Other symptoms and signs involving the nervous system: Secondary | ICD-10-CM | POA: Insufficient documentation

## 2020-04-26 DIAGNOSIS — G63 Polyneuropathy in diseases classified elsewhere: Secondary | ICD-10-CM | POA: Insufficient documentation

## 2020-04-26 DIAGNOSIS — R4189 Other symptoms and signs involving cognitive functions and awareness: Secondary | ICD-10-CM | POA: Insufficient documentation

## 2020-04-26 DIAGNOSIS — C801 Malignant (primary) neoplasm, unspecified: Secondary | ICD-10-CM | POA: Insufficient documentation

## 2020-04-26 DIAGNOSIS — F331 Major depressive disorder, recurrent, moderate: Secondary | ICD-10-CM | POA: Insufficient documentation

## 2020-05-02 ENCOUNTER — Other Ambulatory Visit: Payer: Self-pay | Admitting: Family

## 2020-05-02 ENCOUNTER — Other Ambulatory Visit: Payer: Self-pay | Admitting: Neurology

## 2020-05-02 DIAGNOSIS — F4323 Adjustment disorder with mixed anxiety and depressed mood: Secondary | ICD-10-CM

## 2020-05-02 DIAGNOSIS — G63 Polyneuropathy in diseases classified elsewhere: Secondary | ICD-10-CM

## 2020-05-02 MED ORDER — AMPHETAMINE-DEXTROAMPHET ER 20 MG PO CP24: 20.0000 mg | ORAL_CAPSULE | Freq: Every day | ORAL | 0 refills | Status: DC

## 2020-05-02 NOTE — Telephone Encounter (Signed)
Pt is up to date on her appts. Pt is due for a refill on adderall. [place] Controlled Substance Registry checked and is appropriate.

## 2020-05-02 NOTE — Telephone Encounter (Signed)
Pt is needing a refill on her amphetamine-dextroamphetamine (ADDERALL XR) 20 MG 24 hr capsule sent to the [facility] on [address]

## 2020-05-10 ENCOUNTER — Encounter: Payer: Self-pay | Admitting: Psychology

## 2020-05-10 ENCOUNTER — Encounter: Payer: Medicare Other | Admitting: Psychology

## 2020-05-10 ENCOUNTER — Other Ambulatory Visit: Payer: Self-pay

## 2020-05-10 DIAGNOSIS — G63 Polyneuropathy in diseases classified elsewhere: Secondary | ICD-10-CM

## 2020-05-10 DIAGNOSIS — R4189 Other symptoms and signs involving cognitive functions and awareness: Secondary | ICD-10-CM | POA: Diagnosis present

## 2020-05-10 DIAGNOSIS — F331 Major depressive disorder, recurrent, moderate: Secondary | ICD-10-CM

## 2020-05-10 DIAGNOSIS — R29818 Other symptoms and signs involving the nervous system: Secondary | ICD-10-CM

## 2020-05-10 DIAGNOSIS — C801 Malignant (primary) neoplasm, unspecified: Secondary | ICD-10-CM

## 2020-05-10 NOTE — Progress Notes (Signed)
Neuropsychology Visit  Patient:  [name]   DOB: 01/15/1961  MR Number: [id number]  Location: [facility] [place], [place] [id number] [place] [address] Dept: [phone]  Date of Service: 05/10/2020  Start: 10 AM End: 11 AM  Today's visit was a 1 hour visit that was conducted by outpatient clinic office and was conducted in person with the patient myself present.  Duration of Service: 1 Hour  Provider/Observer:     Edgardo Roys PsyD  Chief Complaint:      Chief Complaint  Patient presents with  . Depression  . 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  The patient has had residual cognitive changes similar to possible impacts of chemotherapy as well as significant residual pain symptoms.  The patient was diagnosed and treated for cancer 10 years ago and continues to have ongoing follow-up regarding possible recurrence.  The patient has had neuropathy, insomnia and memory loss post chemotherapy.  The patient has depressive symptoms that have developed and has a hard time motivating and a hard time keeping focused on life and staying engaged.  The patient does continue to struggle with issues related to coping and adjustment subsequent to cognitive deficits that are residual from her chemotherapy.  However, she is also struggled with recent skin lesions/rash of unknown etiology.  Treatment Interventions:  Therapeutic interventions for issues associated with chronic pain, adjustment issues, depression and cognitive difficulties following a diagnosis and treatment for cancer  which was primarily ovarian in nature.  Participation Level:   Active  Participation Quality:  Appropriate and Attentive      Behavioral Observation:  Well Groomed, Alert, and Appropriate.   Current Psychosocial Factors: The patient has started up a new relationship and so far it is going fairly well although there are some fundamental differences between the patient and the new significant other.  They are working on some of these issues and there have been some stressors but overall the relationship is going well but it is positive.  Content of Session:   Reviewed current symptoms and worked on therapeutic interventions around issues of depression, cognitive difficulties and residual post chemotherapy.  The patient reports that cognitively she is doing better but she continues to struggle with motivation and completing tasks.  The patient reports that her mood has been better and has become motivated enough to start a relationship that at this point is going fairly well.  Effectiveness of Interventions: The patient was very active and open during the visit we had an effective appointment.  Target Goals:   Target goals include working on building coping skills and strategies around issues of depression and residual effects of her chemotherapy including residual cognitive deficits.  Goals Last Reviewed:   05/10/2020   Impression/Diagnosis:   [name] is a 59 year old female referred by Larey Seat, MD for neuropsychological consultation due to ongoing coping and adjustment issues and cognitive changes following diagnosis and treatment for cancer which was 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  The patient has had residual cognitive changes similar to possible impacts of chemotherapy  as well as significant residual pain symptoms.  The patient was diagnosed and treated for cancer 10 years ago and continues to have ongoing follow-up regarding possible  recurrence.  The patient has had neuropathy, insomnia and memory loss post chemotherapy.  The patient has depressive symptoms that have developed and has a hard time motivating and a hard time keeping focused on life and staying engaged.  The patient describes depressive symptoms, including motivation, feelings of helplessness and hopelessness and anxiety about recurrence of her cancer.  The patient also describes ongoing chronic pain symptoms and neuropathy as well as insomnia and memory loss.  Today we worked specifically on behavioral adaptations and building coping skills around issues of her difficulty completing necessary daily activities and daily requirements as her cognitive change in cognitive impairments and impact they have had on emotional status and self-esteem issues are having a very significant deleterious effect on her.  We will continue to work on therapeutic interventions around coping skills and strategies with regard to her depression and residual effects of her cognitive symptoms and pain symptoms post chemotherapy.  Diagnosis:   Neuropathy associated with cancer [facility])  Neurocognitive deficits  Major depressive disorder, recurrent episode, moderate (HCC)  Cognitive impairment    Ilean Skill, Psy.D. Clinical Psychologist Neuropsychologist

## 2020-05-12 ENCOUNTER — Other Ambulatory Visit: Payer: Self-pay | Admitting: Internal Medicine

## 2020-05-12 ENCOUNTER — Other Ambulatory Visit: Payer: Self-pay | Admitting: Family

## 2020-05-12 DIAGNOSIS — G63 Polyneuropathy in diseases classified elsewhere: Secondary | ICD-10-CM

## 2020-05-12 DIAGNOSIS — C801 Malignant (primary) neoplasm, unspecified: Secondary | ICD-10-CM

## 2020-05-15 NOTE — Telephone Encounter (Signed)
Last refill-12-01-19 No lipid labs since 2020 Last video visit- 03-30-2020

## 2020-05-16 DIAGNOSIS — L821 Other seborrheic keratosis: Secondary | ICD-10-CM | POA: Diagnosis not present

## 2020-05-16 DIAGNOSIS — L814 Other melanin hyperpigmentation: Secondary | ICD-10-CM | POA: Diagnosis not present

## 2020-05-16 DIAGNOSIS — D229 Melanocytic nevi, unspecified: Secondary | ICD-10-CM | POA: Diagnosis not present

## 2020-05-16 DIAGNOSIS — L439 Lichen planus, unspecified: Secondary | ICD-10-CM | POA: Diagnosis not present

## 2020-05-16 DIAGNOSIS — D485 Neoplasm of uncertain behavior of skin: Secondary | ICD-10-CM | POA: Diagnosis not present

## 2020-05-20 ENCOUNTER — Other Ambulatory Visit: Payer: Self-pay | Admitting: Hematology & Oncology

## 2020-05-20 DIAGNOSIS — Z9884 Bariatric surgery status: Secondary | ICD-10-CM

## 2020-05-24 ENCOUNTER — Ambulatory Visit: Payer: Medicare Other | Admitting: Psychology

## 2020-06-07 ENCOUNTER — Encounter: Payer: Medicare Other | Attending: Psychology | Admitting: Psychology

## 2020-06-07 ENCOUNTER — Other Ambulatory Visit: Payer: Self-pay

## 2020-06-07 DIAGNOSIS — R29818 Other symptoms and signs involving the nervous system: Secondary | ICD-10-CM | POA: Insufficient documentation

## 2020-06-07 DIAGNOSIS — R4189 Other symptoms and signs involving cognitive functions and awareness: Secondary | ICD-10-CM | POA: Insufficient documentation

## 2020-06-07 DIAGNOSIS — G63 Polyneuropathy in diseases classified elsewhere: Secondary | ICD-10-CM | POA: Diagnosis not present

## 2020-06-07 DIAGNOSIS — C801 Malignant (primary) neoplasm, unspecified: Secondary | ICD-10-CM | POA: Insufficient documentation

## 2020-06-07 DIAGNOSIS — F4323 Adjustment disorder with mixed anxiety and depressed mood: Secondary | ICD-10-CM | POA: Diagnosis not present

## 2020-06-07 DIAGNOSIS — F331 Major depressive disorder, recurrent, moderate: Secondary | ICD-10-CM | POA: Diagnosis not present

## 2020-06-18 NOTE — Progress Notes (Signed)
Neuropsychology Visit  Patient:  [name]   DOB: 03-05-1961  MR Number: [id number]  Location: [facility] [place], [place] [id number] [address] [place] [id number] Dept: [phone]  Date of Service: 06/07/2020  Start: 10 AM End: 11 AM  Today's visit was a 1 hour visit that was conducted by outpatient clinic office and was conducted in person with the patient myself present.  Duration of Service: 1 Hour  Provider/Observer:     Edgardo Roys PsyD  Chief Complaint:      Chief Complaint  Patient presents with  . Depression  . 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  The patient has had residual cognitive changes similar to possible impacts of chemotherapy as well as significant residual pain symptoms.  The patient was diagnosed and treated for cancer 10 years ago and continues to have ongoing follow-up regarding possible recurrence.  The patient has had neuropathy, insomnia and memory loss post chemotherapy.  The patient has depressive symptoms that have developed and has a hard time motivating and a hard time keeping focused on life and staying engaged.  The patient does continue to struggle with issues related to coping and adjustment subsequent to cognitive deficits that are residual from her chemotherapy.  However, she is also struggled with recent skin lesions/rash of unknown etiology.  Treatment Interventions:  Therapeutic interventions for issues associated with chronic pain, adjustment issues, depression and cognitive difficulties following a diagnosis and treatment for cancer  which was primarily ovarian in nature.  Participation Level:   Active  Participation Quality:  Appropriate and Attentive      Behavioral Observation:  Well Groomed, Alert, and Appropriate.   Current Psychosocial Factors: The patient has started up a new relationship and so far it is going fairly well although there are some fundamental differences between the patient and the new significant other.  They are working on some of these issues and there have been some stressors but overall the relationship is going well but it is positive.  Content of Session:   Reviewed current symptoms and worked on therapeutic interventions around issues of depression, cognitive difficulties and residual post chemotherapy.  The patient reports that cognitively she is doing better but she continues to struggle with motivation and completing tasks.  The patient reports that her mood has been better and has become motivated enough to start a relationship that at this point is going fairly well.  Effectiveness of Interventions: The patient was very active and open during the visit we had an effective appointment.  Target Goals:   Target goals include working on building coping skills and strategies around issues of depression and residual effects of her chemotherapy including residual cognitive deficits.  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  The patient has had residual cognitive changes similar to possible impacts of chemotherapy  as well as significant residual pain symptoms.  The patient was diagnosed and treated for cancer 10 years ago and continues to have ongoing follow-up regarding possible  recurrence.  The patient has had neuropathy, insomnia and memory loss post chemotherapy.  The patient has depressive symptoms that have developed and has a hard time motivating and a hard time keeping focused on life and staying engaged.  The patient describes depressive symptoms, including motivation, feelings of helplessness and hopelessness and anxiety about recurrence of her cancer.  The patient also describes ongoing chronic pain symptoms and neuropathy as well as insomnia and memory loss.  Today we worked specifically on behavioral adaptations and building coping skills around issues of her difficulty completing necessary daily activities and daily requirements as her cognitive change in cognitive impairments and impact they have had on emotional status and self-esteem issues are having a very significant deleterious effect on her.  We will continue to work on therapeutic interventions around coping skills and strategies with regard to her depression and residual effects of her cognitive symptoms and pain symptoms post chemotherapy.  Diagnosis:   Neuropathy associated with cancer (HCC)  Neurocognitive deficits  Major depressive disorder, recurrent episode, moderate (HCC)  Adjustment disorder with mixed anxiety and depressed mood    Ilean Skill, Psy.D. Clinical Psychologist Neuropsychologist

## 2020-06-20 ENCOUNTER — Telehealth: Payer: Self-pay

## 2020-06-20 ENCOUNTER — Telehealth: Payer: Self-pay | Admitting: Internal Medicine

## 2020-06-20 ENCOUNTER — Inpatient Hospital Stay: Payer: Medicare Other | Attending: Hematology & Oncology

## 2020-06-20 ENCOUNTER — Other Ambulatory Visit: Payer: Self-pay

## 2020-06-20 ENCOUNTER — Inpatient Hospital Stay: Payer: Medicare Other

## 2020-06-20 VITALS — BP 150/105 | HR 92 | Temp 98.4°F | Resp 18

## 2020-06-20 DIAGNOSIS — Z8589 Personal history of malignant neoplasm of other organs and systems: Secondary | ICD-10-CM | POA: Insufficient documentation

## 2020-06-20 DIAGNOSIS — Z452 Encounter for adjustment and management of vascular access device: Secondary | ICD-10-CM | POA: Diagnosis not present

## 2020-06-20 DIAGNOSIS — Z95828 Presence of other vascular implants and grafts: Secondary | ICD-10-CM

## 2020-06-20 MED ORDER — SODIUM CHLORIDE 0.9% FLUSH
10.0000 mL | Freq: Once | INTRAVENOUS | Status: AC
  Administered 2020-06-20: 10 mL via INTRAVENOUS
  Filled 2020-06-20: qty 10

## 2020-06-20 MED ORDER — HEPARIN SOD (PORK) LOCK FLUSH 100 UNIT/ML IV SOLN
500.0000 [IU] | Freq: Once | INTRAVENOUS | Status: AC
  Administered 2020-06-20: 500 [IU] via INTRAVENOUS
  Filled 2020-06-20: qty 5

## 2020-06-20 NOTE — Patient Instructions (Signed)
Implanted [facility] Guide An implanted port is a device that is placed under the skin. It is usually placed in the chest. The device can be used to give IV medicine, to take blood, or for dialysis. You may have an implanted port if:  You need IV medicine that would be irritating to the small veins in your hands or arms.  You need IV medicines, such as antibiotics, for a long period of time.  You need IV nutrition for a long period of time.  You need dialysis. When you have a port, your health care provider can choose to use the port instead of veins in your arms for these procedures. You may have fewer limitations when using a port than you would if you used other types of long-term IVs, and you will likely be able to return to normal activities after your incision heals. An implanted port has two main parts:  Reservoir. The reservoir is the part where a needle is inserted to give medicines or draw blood. The reservoir is round. After it is placed, it appears as a small, raised area under your skin.  Catheter. The catheter is a thin, flexible tube that connects the reservoir to a vein. Medicine that is inserted into the reservoir goes into the catheter and then into the vein. How is my port accessed? To access your port:  A numbing cream may be placed on the skin over the port site.  Your health care provider will put on a mask and sterile gloves.  The skin over your port will be cleaned carefully with a germ-killing soap and allowed to dry.  Your health care provider will gently pinch the port and insert a needle into it.  Your health care provider will check for a blood return to make sure the port is in the vein and is not clogged.  If your port needs to remain accessed to get medicine continuously (constant infusion), your health care provider will place a clear bandage (dressing) over the needle site. The dressing and needle will need to be changed every week, or as told by your  health care provider. What is flushing? Flushing helps keep the port from getting clogged. Follow instructions from your health care provider about how and when to flush the port. Ports are usually flushed with saline solution or a medicine called heparin. The need for flushing will depend on how the port is used:  If the port is only used from time to time to give medicines or draw blood, the port may need to be flushed: ? Before and after medicines have been given. ? Before and after blood has been drawn. ? As part of routine maintenance. Flushing may be recommended every 4-6 weeks.  If a constant infusion is running, the port may not need to be flushed.  Throw away any syringes in a disposal container that is meant for sharp items (sharps container). You can buy a sharps container from a pharmacy, or you can make one by using an empty hard plastic bottle with a cover. How long will my port stay implanted? The port can stay in for as long as your health care provider thinks it is needed. When it is time for the port to come out, a surgery will be done to remove it. The surgery will be similar to the procedure that was done to put the port in. Follow these instructions at home:  Flush your port as told by your health care  provider.  If you need an infusion over several days, follow instructions from your health care provider about how to take care of your port site. Make sure you: ? Wash your hands with soap and water before you change your dressing. If soap and water are not available, use alcohol-based hand sanitizer. ? Change your dressing as told by your health care provider. ? Place any used dressings or infusion bags into a plastic bag. Throw that bag in the trash. ? Keep the dressing that covers the needle clean and dry. Do not get it wet. ? Do not use scissors or sharp objects near the tube. ? Keep the tube clamped, unless it is being used.  Check your port site every day for signs  of infection. Check for: ? Redness, swelling, or pain. ? Fluid or blood. ? Pus or a bad smell.  Protect the skin around the port site. ? Avoid wearing bra straps that rub or irritate the site. ? Protect the skin around your port from seat belts. Place a soft pad over your chest if needed.  Bathe or shower as told by your health care provider. The site may get wet as long as you are not actively receiving an infusion.  Return to your normal activities as told by your health care provider. Ask your health care provider what activities are safe for you.  Carry a medical alert card or wear a medical alert bracelet at all times. This will let health care providers know that you have an implanted port in case of an emergency.   Get help right away if:  You have redness, swelling, or pain at the port site.  You have fluid or blood coming from your port site.  You have pus or a bad smell coming from the port site.  You have a fever. Summary  Implanted ports are usually placed in the chest for long-term IV access.  Follow instructions from your health care provider about flushing the port and changing bandages (dressings).  Take care of the area around your port by avoiding clothing that puts pressure on the area, and by watching for signs of infection.  Protect the skin around your port from seat belts. Place a soft pad over your chest if needed.  Get help right away if you have a fever or you have redness, swelling, pain, drainage, or a bad smell at the port site. This information is not intended to replace advice given to you by your health care provider. Make sure you discuss any questions you have with your health care provider. Document Revised: 09/13/2019 Document Reviewed: 09/13/2019 Elsevier Patient Education  [place].

## 2020-06-20 NOTE — Telephone Encounter (Signed)
Pt call and stated she went to [facility] because her bp is high and I offer her a appt for tomorrow and she decline because she want dr.Panosh to tell her what she need to do.bp is 158 over 108.

## 2020-06-20 NOTE — Telephone Encounter (Signed)
Pt called in to move her flush appt today to 3:00, pt also asked that I send a message to [facility] to move her 08/18/20 flush to them-done    Ravenne Wayment

## 2020-06-20 NOTE — Progress Notes (Signed)
Patients BP 158/118, rechecked manually 150/105. Patient declines any symptoms at this time, states she did wake up with a headache yesterday morning but no headache today. States she has been running around doing errands for the last 6 hours and only slept for 3 hours last night. Informed Judson Roch [place],NP who states patient needs to call her PCP to inform them about her high blood pressure and go to ED if it persists at home or develops any symptoms again. Patient declines wanting to go to ED currently. Declines any other questions or concerns today

## 2020-06-20 NOTE — Telephone Encounter (Signed)
I do not have enough information    please call patient ask her how she is feeling   Get more clinical info   Need visit virtual or other to help decide what to do  Please update her medicine  List

## 2020-06-21 ENCOUNTER — Other Ambulatory Visit (INDEPENDENT_AMBULATORY_CARE_PROVIDER_SITE_OTHER): Payer: Medicare Other

## 2020-06-21 ENCOUNTER — Telehealth (INDEPENDENT_AMBULATORY_CARE_PROVIDER_SITE_OTHER): Payer: Medicare Other | Admitting: Internal Medicine

## 2020-06-21 ENCOUNTER — Other Ambulatory Visit: Payer: Self-pay | Admitting: Internal Medicine

## 2020-06-21 ENCOUNTER — Encounter: Payer: Self-pay | Admitting: Internal Medicine

## 2020-06-21 ENCOUNTER — Other Ambulatory Visit: Payer: Self-pay | Admitting: Neurology

## 2020-06-21 VITALS — BP 135/91 | Temp 97.5°F | Ht 68.0 in | Wt 204.0 lb

## 2020-06-21 DIAGNOSIS — E876 Hypokalemia: Secondary | ICD-10-CM | POA: Diagnosis not present

## 2020-06-21 DIAGNOSIS — Z79899 Other long term (current) drug therapy: Secondary | ICD-10-CM | POA: Diagnosis not present

## 2020-06-21 DIAGNOSIS — G43109 Migraine with aura, not intractable, without status migrainosus: Secondary | ICD-10-CM

## 2020-06-21 DIAGNOSIS — I10 Essential (primary) hypertension: Secondary | ICD-10-CM

## 2020-06-21 LAB — BASIC METABOLIC PANEL
BUN: 20 mg/dL (ref 6–23)
CO2: 27 mEq/L (ref 19–32)
Calcium: 9.6 mg/dL (ref 8.4–10.5)
Chloride: 102 mEq/L (ref 96–112)
Creatinine, Ser: 1.06 mg/dL (ref 0.40–1.20)
GFR: 57.36 mL/min — ABNORMAL LOW (ref >60.00)
Glucose, Bld: 94 mg/dL (ref 70–99)
Potassium: 4.1 mEq/L (ref 3.5–5.1)
Sodium: 137 mEq/L (ref 135–145)

## 2020-06-21 LAB — MAGNESIUM: Magnesium: 1.8 mg/dL (ref 1.5–2.5)

## 2020-06-21 MED ORDER — SPIRONOLACTONE-HCTZ 25-25 MG PO TABS: 1.0000 | ORAL_TABLET | Freq: Every day | ORAL | 3 refills | Status: DC

## 2020-06-21 MED ORDER — NURTEC 75 MG PO TBDP: ORAL_TABLET | ORAL | 1 refills | Status: DC

## 2020-06-21 NOTE — Progress Notes (Signed)
Virtual Visit via Video Note  I connected with@ on 06/21/20 at  9:30 AM EST by a video enabled telemedicine application and verified that I am speaking with the correct person using two identifiers. Location patient: home Location provider:work  office Persons participating in the virtual visit: patient, provider  [place]th national recommendations  regarding COVID 19 pandemic   video visit is advised over in office visit for this patient.  Patient aware  of the limitations of evaluation and management by telemedicine and  availability of in person appointments. and agreed to proceed.   HPI: [name] presents for video visit (see yesterdays message) for HA and BP elevations   awoke with very bad migraine   2 days  Ago  And  Running around  And when went to visit    Hard time going down     BP  last night   Same       The Ha again.  BP Up to 178    115  And then am 120/80 flat  And now 145 up and around 100/  Frontal  HA  A bit stuffy.    No fever .  Through forehead steady and  A lot of pressure . Light sensitivity  No NV fever  Injury  Hx of same  Pre menopause and rarely   After .   1-2  Per year   Migraine   Last one in months     Neck pain didn't sleep well night before   Took last butabital with some help    Used to have mha  Pre menopausal  usuallu not this severe and stopped after  Takes Maxzide and 3 x 10 potassium     For BP .    newer meds  adderall    Not reg voltaren  ( for back) ROS: See pertinent positives and negatives per HPI. Rare etoh tob ,1/2ppd,caffiene 2 per day  Has to leave town to be with mom in [place] for 2  weeks tomorrow Past Medical History:  Diagnosis Date  . Adenocarcinoma [facility])    unknown primary probaby ovarian 2009 chemo  . Adjustment disorder   . Allergic rhinitis   . Allergy   . Alopecia   . Anxiety and depression   . Arthritis   . Blood transfusion without reported diagnosis   . 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 Clotting disorder ([place])    . GERD (gastroesophageal reflux disease)   . HLD (hyperlipidemia)   . Hx pulmonary embolism   . Hypertension   . Iron deficiency anemia due to chronic blood loss 11/04/2016  . Metrorrhagia   . Neuromuscular disorder ([place])   . Neuropathy   . Obesity   . Ovarian cancer ([place]) 04/2008   ChemoTherapy  . 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 Raynaud's syndrome   . Right groin mass   . Squamous cell carcinoma of skin   . Ulcerative colitis ([place])   . Vision abnormalities   . Vitamin D deficiency     Past Surgical History:  Procedure Laterality Date  . ABDOMINAL HYSTERECTOMY  05/2008   TAH/BSO  . AUGMENTATION MAMMAPLASTY Bilateral 10+ years  . BREAST BIOPSY Left 2020  . BREAST BIOPSY Right   . BREAST REDUCTION SURGERY  2000  . CESAREAN SECTION     1991  . COLONOSCOPY  2004; 12/07/10   hemorrhoids  . ESOPHAGOGASTRODUODENOSCOPY    . GASTRIC BYPASS  1979  . REDUCTION MAMMAPLASTY Bilateral   .  TOENAIL EXCISION Bilateral   . TONSILLECTOMY      Family History  Problem Relation Age of Onset  . Pulmonary embolism Mother   . Hypertension Father   . Breast cancer Maternal Grandmother        diagnosed in early 10s  . Hyperlipidemia Other   . Breast cancer Sister 23  . Colon cancer Neg Hx   . Esophageal cancer Neg Hx   . Rectal cancer Neg Hx   . Stomach cancer Neg Hx     Social History   Tobacco Use  . Smoking status: Current Every Day Smoker    Packs/day: 1.00    Years: 37.00    Pack years: 37.00    Types: Cigarettes    Start date: 06/16/1978  . Smokeless tobacco: Never Used  . Tobacco comment: 2- 7-16  STILL SMOKING  Vaping Use  . Vaping Use: Never used  Substance Use Topics  . Alcohol use: Yes    Alcohol/week: 0.0 standard drinks    Comment: rare, 3 weekly  . Drug use: No      Current Outpatient Medications:  .  amphetamine-dextroamphetamine (ADDERALL XR) 20 MG 24 hr capsule, Take 1 capsule (20 mg total) by mouth daily., Disp: 30 capsule, Rfl: 0 .   aspirin 81 MG tablet, Take 81 mg by mouth daily. 2 TABS IN AM, Disp: , Rfl:  .  calcium carbonate (TUMS - DOSED IN MG ELEMENTAL CALCIUM) 500 MG chewable tablet, Chew 1 tablet by mouth daily as needed for indigestion or heartburn., Disp: , Rfl:  .  diclofenac (VOLTAREN) 75 MG EC tablet, TK 1 T PO BID AFTER MEALS FOR INFLAMMATION / PAIN / SWELLING PRN ONLY, Disp: , Rfl: 1 .  donepezil (ARICEPT) 10 MG tablet, Take 1 tablet (10 mg total) by mouth every morning., Disp: 90 tablet, Rfl: 2 .  DULoxetine (CYMBALTA) 60 MG capsule, TAKE 1 CAPSULE BY MOUTH DAILY, Disp: 90 capsule, Rfl: 0 .  Multiple Vitamin (MULTIVITAMIN WITH MINERALS) TABS tablet, Take 1 tablet by mouth daily., Disp: , Rfl:  .  orphenadrine (NORFLEX) 100 MG tablet, Take 1 tablet (100 mg total) by mouth 2 (two) times daily as needed for muscle spasms. Schedule follow up visit for more refills. [phone], Disp: 180 tablet, Rfl: 0 .  pregabalin (LYRICA) 200 MG capsule, TAKE 1 CAPSULE(200 MG) BY MOUTH TWICE DAILY, Disp: 60 capsule, Rfl: 2 .  Rimegepant Sulfate (NURTEC) 75 MG TBDP, Take one orally prn migraine headache .limit one a day, Disp: 15 tablet, Rfl: 1 .  simvastatin (ZOCOR) 40 MG tablet, TAKE 1 TABLET BY MOUTH DAILY, Disp: 90 tablet, Rfl: 0 .  spironolactone-hydrochlorothiazide (ALDACTAZIDE) 25-25 MG tablet, Take 1 tablet by mouth daily. For hypertension. Stop the potassium supplementand maxide,.check blood tests in 2-3 weeks, Disp: 30 tablet, Rfl: 3 .  Vitamin D, Ergocalciferol, (DRISDOL) 1.25 MG (50000 UNIT) CAPS capsule, TAKE 1 CAPSULE BY MOUTH 1 TIME A WEEK, Disp: 13 capsule, Rfl: 2 .  zolpidem (AMBIEN) 5 MG tablet, Take 1-2 tablets (5-10 mg total) by mouth at bedtime as needed., Disp: 180 tablet, Rfl: 0 .  butalbital-acetaminophen-caffeine (FIORICET, ESGIC) 50-325-40 MG tablet, TAKE 1 TABLET BY MOUTH EVERY 6 HOURS AS NEEDED FOR HEADACHE (Patient not taking: Reported on 06/21/2020), Disp: 10 tablet, Rfl: 3  Current  Facility-Administered Medications:  .  0.9 %  sodium chloride infusion, 500 mL, Intravenous, Once, Gatha Mayer, MD  EXAM: BP Readings from Last 3 Encounters:  06/21/20 (!) 135/91  06/20/20 [facility])  150/105  04/19/20 (!) 124/92    VITALS per patient if applicable:  GENERAL: alert, oriented, appears well and in no acute distress midly uncomfortable nl speech non focal  grossly  HEENT: atraumatic, conjunttiva clear, no obvious abnormalities on inspection of external nose and ears  NECK: normal movements of the head and neck  LUNGS: on inspection no signs of respiratory distress, breathing rate appears normal, no obvious gross SOB, gasping or wheezing  CV: no obvious cyanosis  MS: moves all visible extremities without noticeable abnormality  PSYCH/NEURO: pleasant and cooperative, no obvious depression or anxiety, speech and thought processing grossly intact Lab Results  Component Value Date   WBC 6.3 04/19/2020   HGB 14.0 04/19/2020   HCT 43.1 04/19/2020   PLT 197 04/19/2020   GLUCOSE 94 06/21/2020   CHOL 185 02/09/2019   TRIG 189.0 (H) 02/09/2019   HDL 45.50 02/09/2019   LDLDIRECT 98.7 09/04/2009   LDLCALC 101 (H) 02/09/2019   ALT 37 04/19/2020   AST 26 04/19/2020   NA 137 06/21/2020   K 4.1 06/21/2020   CL 102 06/21/2020   CREATININE 1.06 06/21/2020   BUN 20 06/21/2020   CO2 27 06/21/2020   TSH 3.90 03/01/2020   INR 0.86 06/02/2013   HGBA1C 6.2 02/09/2019    ASSESSMENT AND PLAN:  Discussed the following assessment and plan:    ICD-10-CM   1. Hypokalemia  L37.3 Basic metabolic panel    Magnesium    Basic metabolic panel  2. Essential hypertension  S28 Basic metabolic panel    Magnesium    Basic metabolic panel  3. Medication management  J68.115 Basic metabolic panel    Magnesium    Basic metabolic panel  4. Migraine with aura and without status migrainosus, not intractable  B26.203 Basic metabolic panel    Magnesium   Described headaches as her migraines  of old but new onset. Hypertension baseline is reported is okay but may be high normal or early phase and noted hypokalemia despite supplementation.  Consider hyper Aldo forces.  Her previous abdominal CT showed no adrenal masses. Plan BMP magnesium today she is leaving town tomorrow morning to be with her mom in [place] for 2 weeks We will send in [place] as needed for headache at this time Considering waiting results may switch her blood pressure medicine to Aldactazide or Aldactone.  Similar. Will reach out to neurology for help with problem with renewed recurrence of migraine after quiescence. She is on a couple of meds that can raise blood pressure also.  Would prefer not to use butalbital as a rescue.  At this time.  Previously prescribed in small amount by neurology. Counseled.   Expectant management and discussion of plan and treatment with opportunity to ask questions and all were answered. The patient agreed with the plan and demonstrated an understanding of the instructions.   Advised to call back or seek an in-person evaluation if worsening  or having  further concerns . Return for depending on results bp readings  in 2-3 weeks  labs if change meds. Shanon Ace, MD

## 2020-06-21 NOTE — Progress Notes (Signed)
Potassium was good.  STOP the Maxzide AND the potassium and begin the aldactazide  ( combo diuretic to raise potassium)  plan check bmp ( potassium level when you get back  in 2-3 weeks . Send in bp readings  also. I sent in headache medication as needed one a day if needed.  If you  have problems wtih new medication contact us  right away for advice.  There are many other options for treatment.  I will also reach out to neurology about the headache recurrent

## 2020-06-22 ENCOUNTER — Ambulatory Visit: Payer: Medicare Other | Admitting: Internal Medicine

## 2020-06-22 ENCOUNTER — Telehealth: Payer: Self-pay | Admitting: Internal Medicine

## 2020-06-22 DIAGNOSIS — H53143 Visual discomfort, bilateral: Secondary | ICD-10-CM

## 2020-06-22 DIAGNOSIS — G43109 Migraine with aura, not intractable, without status migrainosus: Secondary | ICD-10-CM

## 2020-06-22 DIAGNOSIS — G4701 Insomnia due to medical condition: Secondary | ICD-10-CM

## 2020-06-22 DIAGNOSIS — H8113 Benign paroxysmal vertigo, bilateral: Secondary | ICD-10-CM

## 2020-06-22 MED ORDER — BUTALBITAL-APAP-CAFFEINE 50-325-40 MG PO TABS
ORAL_TABLET | ORAL | 0 refills | Status: DC
Start: 2020-06-22 — End: 2020-10-05

## 2020-06-22 NOTE — Telephone Encounter (Signed)
Pt call and stated her Insurance will not cover the first prescription and she want dr.panosh to sent a new  prescription fro Butalbital to  [facility], [address] AT [address] [place] Phone:  [phone]  Fax:  [phone]

## 2020-06-22 NOTE — Telephone Encounter (Signed)
It is  unfortunate  As this is  A safer better options perhaps needs a prior authorization?   But will  Send in  Controlled medication since she needs  Fu  Further refills should come from neurology team

## 2020-06-27 ENCOUNTER — Telehealth: Payer: Self-pay

## 2020-06-27 NOTE — Telephone Encounter (Signed)
Prior authorization was started for [facility] is not willing to pay for this medication... Alternatives are Naratriptan, Rizatriptan or Sumatriptan Succinate.  Can that medication be changed?

## 2020-06-28 ENCOUNTER — Encounter: Payer: Medicare Other | Admitting: Psychology

## 2020-06-28 NOTE — Telephone Encounter (Signed)
Please see telephone message in chart from 06/27/2020

## 2020-07-01 NOTE — Telephone Encounter (Signed)
No needs to see neurology for her headaches   Management See notes.

## 2020-07-03 ENCOUNTER — Telehealth: Payer: Self-pay | Admitting: Neurology

## 2020-07-03 ENCOUNTER — Other Ambulatory Visit: Payer: Self-pay | Admitting: Neurology

## 2020-07-03 MED ORDER — AMPHETAMINE-DEXTROAMPHET ER 20 MG PO CP24: 20.0000 mg | ORAL_CAPSULE | Freq: Every day | ORAL | 0 refills | Status: DC

## 2020-07-03 NOTE — Telephone Encounter (Signed)
See other note. Dr. Regis Bill refilled pt's headache medicine & pt has an appt with neuro next month.

## 2020-07-03 NOTE — Telephone Encounter (Signed)
Pt is needing a refill on her amphetamine-dextroamphetamine (ADDERALL XR) 20 MG 24 hr capsule. Pt is in [place] taking care of her mother after surgery and is wanting to know if the medication can be sent  in to the [facility] on [address]. in [place], [place]

## 2020-07-03 NOTE — Telephone Encounter (Signed)
I have routed this request to Dr Brett Fairy for review. The pt is due for the medication and [place] registry was verified. I have also corrected the pharmacy on file for the patient

## 2020-07-19 ENCOUNTER — Encounter: Payer: Medicare Other | Attending: Psychology | Admitting: Psychology

## 2020-07-19 ENCOUNTER — Encounter: Payer: Self-pay | Admitting: Psychology

## 2020-07-19 ENCOUNTER — Other Ambulatory Visit: Payer: Self-pay

## 2020-07-19 DIAGNOSIS — G479 Sleep disorder, unspecified: Secondary | ICD-10-CM | POA: Insufficient documentation

## 2020-07-19 DIAGNOSIS — R29818 Other symptoms and signs involving the nervous system: Secondary | ICD-10-CM | POA: Insufficient documentation

## 2020-07-19 DIAGNOSIS — F331 Major depressive disorder, recurrent, moderate: Secondary | ICD-10-CM | POA: Insufficient documentation

## 2020-07-19 DIAGNOSIS — R4189 Other symptoms and signs involving cognitive functions and awareness: Secondary | ICD-10-CM | POA: Insufficient documentation

## 2020-07-19 DIAGNOSIS — F4310 Post-traumatic stress disorder, unspecified: Secondary | ICD-10-CM | POA: Diagnosis not present

## 2020-07-19 NOTE — Progress Notes (Addendum)
Neuropsychology Visit  Patient:  [name]   DOB: 1961/02/19  MR Number: [id number]  Location: [facility] [place], [place] [id number] [place] [id number] Dept: [phone]  Date of Service: 07/19/2020  Start:  11 AM End: 12 PM  Today's visit was a 1 hour visit was conducted in outpatient clinic office with the patient myself present.  Duration of Service: 1 Hour  Provider/Observer:     Edgardo Roys PsyD  Chief Complaint:      Chief Complaint  Patient presents with  . Pain  . Depression  . 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  The patient has had residual cognitive changes similar to possible impacts of chemotherapy as well as significant residual pain symptoms.  The patient was diagnosed and treated for cancer 10 years ago and continues to have ongoing follow-up regarding possible recurrence.  The patient has had neuropathy, insomnia and memory loss post chemotherapy.  The patient has depressive symptoms that have developed and has a hard time motivating and a hard time keeping focused on life and staying engaged.  The patient is continued struggle with issues related to coping and adjustment as well as ongoing cognitive difficulties and difficulties with completing task.  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   Treatment Interventions:  Therapeutic interventions associated with chronic pain, residual  effects of chemotherapy interventions symptoms associated with PTSD, depression and severe avoidance behaviors.  Participation Level:   Active  Participation Quality:  Appropriate and Attentive      Behavioral Observation:  Well Groomed, Alert, and Appropriate.   Current Psychosocial Factors: The patient has had a lot of stressors recently related to psychosocial stressors around her mother surgery, difficulty completing significant and important paperwork as well as medical issues including an infection in her foot as well as skin rash/lesioning.  Content of Session:   Reviewed current symptoms and worked on therapeutic interventions around issues of depression, cognitive difficulties and residual post chemotherapy.  The patient reports that cognitively she is doing better but she continues to struggle with motivation and completing tasks.  The patient reports that her mood has been better and has become motivated enough to start a relationship that at this point is going fairly well.  Effectiveness of Interventions: The patient was very active and open during the visit we had an effective appointment.  Target Goals:   Target goals include working on building coping skills and strategies around issues of depression and residual effects of her chemotherapy including residual cognitive deficits.  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  The patient has had residual cognitive changes similar to possible impacts of chemotherapy as well as  significant residual pain symptoms.  The patient was diagnosed and treated for cancer 10 years ago and continues to have ongoing follow-up regarding  possible recurrence.  The patient has had neuropathy, insomnia and memory loss post chemotherapy.  The patient has depressive symptoms that have developed and has a hard time motivating and a hard time keeping focused on life and staying engaged.  The patient describes depressive symptoms, including motivation, feelings of helplessness and hopelessness and anxiety about recurrence of her cancer.  The patient also describes ongoing chronic pain symptoms and neuropathy as well as insomnia and memory loss.  Today we worked specifically on behavioral adaptations and building coping skills around issues of her difficulty completing necessary daily activities and daily requirements as her cognitive change in cognitive impairments and impact they have had on emotional status and self-esteem issues are having a very significant deleterious effect on her.  The patient is continued with symptoms and continues to be unable to work as her cognitive, stress, pain symptoms all leave her unable to perform full-time gainful employment.  The patient has a long history of significant avoidance behaviors particular on completing complex paperwork.  This is part of her overall condition and is not simply the patient not completing aspects that she should.  The patient's avoidance is associated with anxiety and is part of her debilitating symptoms.  The patient has a number of symptoms that have an overlap in their symptom spectrum.  These include residual PTSD-like symptoms, depression, significant pain and possible complex regional pain syndrome symptoms etc.  Reviewing her overall symptoms and status I do think that she may be a very good candidate for ketamine infusion in the future.  I have asked the patient to speak with her oncologist as these types of treatments are being done through oncology in the [facility] network.  If it is not something that she could get to the [facility] network they are also providing the services through  [facility] at the [employer] as well.  This may take some time as getting insurance coverage may require active efforts but we will continue to look at this possibility going forward.  Diagnosis:   Neurocognitive deficits  Major depressive disorder, recurrent episode, moderate (HCC)  Post traumatic stress disorder (PTSD)    Ilean Skill, Psy.D. Clinical Psychologist Neuropsychologist

## 2020-07-31 ENCOUNTER — Other Ambulatory Visit: Payer: Self-pay | Admitting: Hematology & Oncology

## 2020-07-31 DIAGNOSIS — F4323 Adjustment disorder with mixed anxiety and depressed mood: Secondary | ICD-10-CM

## 2020-07-31 DIAGNOSIS — G63 Polyneuropathy in diseases classified elsewhere: Secondary | ICD-10-CM

## 2020-07-31 DIAGNOSIS — C801 Malignant (primary) neoplasm, unspecified: Secondary | ICD-10-CM

## 2020-08-07 ENCOUNTER — Other Ambulatory Visit: Payer: Self-pay | Admitting: Neurology

## 2020-08-07 ENCOUNTER — Telehealth: Payer: Self-pay | Admitting: Neurology

## 2020-08-07 MED ORDER — AMPHETAMINE-DEXTROAMPHET ER 20 MG PO CP24: 20.0000 mg | ORAL_CAPSULE | Freq: Every day | ORAL | 0 refills | Status: DC

## 2020-08-07 NOTE — Telephone Encounter (Signed)
I have routed this request to Dr Brett Fairy for review. The pt is due for the medication and [place] registry was verified.

## 2020-08-07 NOTE — Telephone Encounter (Signed)
Pt has called for a refill on her amphetamine-dextroamphetamine (ADDERALL XR) 20 MG 24 hr capsule to [facility] [phone]

## 2020-08-08 NOTE — Progress Notes (Deleted)
PATIENT: [name] DOB: 1960-09-08  REASON FOR VISIT: follow up HISTORY FROM: patient  No chief complaint on file.    HISTORY OF PRESENT ILLNESS: 08/08/20 ALL:  She returns for follow up for migraines, neurocognitive deficits and insomnia. Dr Dohmeier saw her in 01/2020. She continues Ambien as insomnia was stable. Adderall was added for concerns of inattention and memory deficits. She also continues Aricept 11m. Lyrica helps with peripheral neuropathy prescribed by PCP.    02-09-2020 CD: RN notes-rm 10. presents for follow up visit. pt states that he has had a pinching sensation in back area which started 2 weeks ago and its intermittent. she has been seeing Dr RSima Matasand feels that has been helping. pt said she had some skin concerns for  which she is seeing specialist. She developed a dark spot in her left eye saw her ophthalmologist, a laser procedure is planned for 10/4.  CD 02-09-2020. I have the pleasure meeting again today again Ms. Shekita Saxon- Mays , a multiracial femele patient, who was last seen by our nurse practitioner Isidor Bromell.  The patient underwent neuropsychological testing and the last date of service had been 9-21 2021.  This was a follow-up with Dr. JIlean Skillwhom she had seen a year earlier for baseline neuropsychological evaluation.  She has ongoing adjustment issues, cognitive changes following her treatment for cancer.  She had some residual cognitive changes that were attributed to chemotherapy effects.  She also has a neuropathy that is likely related to either the primary malignancy or the treatment thereof.  Dr. 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   The diagnosis of neurocognitive deficits had been made a year earlier. She has been reporting ongoing hypersomnia- Epworth sleepiness score 15/ 14 points, she has better sleep quality at night.  Bouts of insomnia but much less long and frequent.  She attributed this to Dr RGaspar Colabehavior therapy, improving also anxiety and depression. Depression manifesting I feeling overwhelmed, feeling paralyzed by the overwhelmed feeling. " get no- thing done ". Continues to take antidepressant medication. Lack of energy: " tired every day and then not sleeping at night "    08/05/2019 ALL:  RDANISA KOPECis a 60y.o. female here today for follow up. She is seeing psychology. She feels that it helps. Sleep is stable. She is trying to only take 1 tablet of Ambien versus two. She continues Aricept 189m She feels that memory waxes and wanes. She lives alone. She is able to perform all ADL's independently. She is able to drive. She makes "wrong turns" frequently but is able to get to her destination without difficulty. She declines MOCA today stating "that is not my problem." She feels that chemo has affected her ability to stay focused. She uses written instructions for making coffee each morning. She uses sticky notes. She gets overwhelmed with paying bills so she has hired someone to help her with this. She continues Lyrica 20051maily and feels this is very helpful. Lyrica is prescribed by oncology. She continues to see oncology every 3-4 months.   HISTORY: (copied from Dr Dohmeier's note on 11/25/2018)  I have the pleasure of seeing [name] D. [name], a 60  year old woman of african and caucasian mixed race who reports less trouble sleeping but trouble with memory, progressive.  She has neuropathy, had migraine, is at high risk of falling. 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 She has had spells that were syncopal, and some looking like seizures.  She  reports feeling isolated during the corona pandemic, has seen neither children , grandchild, nor  her parents.she is tearful , appears very depressed. She takes Cymbalta for mood.     2016 : [name] is a 60 year old female with a history of neuropathy,cognitive changes and insomnia. She returns today for follow-up. The patient continues to take Ambien for insomnia. The patient states that if she does not take this medication she will not be able to go to sleep. The patient currently takes Lyrica 150 mg twice a day for neuropathy. Patient states that the burning and tingling is located in the toes extending to the mid foot bilaterally. She also has burning and tingling in the fingers that extends to the mid hand. Denies any changes with her gait or balance. That she had a fall while standing and watching fireworks. She states that the right leg went numb and cause her to fall. She states that this is the first time this has happened. She states that the numbness resolved. He states that she is not had any additional episodes of numbness. Patient feels that her memory has remained same. She states that she is able to do simple tasks but things that require more complex thinking she has difficulty with. For example she has trouble following plots in a TV show. She states that she has directions for how to make coffee posted. She is able to complete all ADLs independently. She operates a Teacher, music without difficulty. She states that occasionally she'll take a wrong turn to a familiar place. She continues to take Aricept 10 mg daily. Patient states that she did have a sleep study area and she is unsure if she should be wearing oxygen at bedtime. She states that she had a dental procedure and the technician noted that her oxygen saturation dropped while she was sleeping. She denies any new neurological issues. She returns today for an evaluation.  HISTORY 05/18/14 (CD): [name] is here today for her  yearly revisit she is doing remarkably well knowing about her history of metastatic cancer with unknown primary to more she seems to have battled her disease very well.  She has lost some weight since her last visit, her blood pressure is well controlled. The HST study performed on 10-23-13 showed obstructive sleep apnea not to be present, but some hypoxemia at night there were 109 minutes of oxygenation at 88% or less. Since this test was a home sleep test there is a higher likelihood of artifact. The patient does 1-2 times a night feel air hungry. She does wake occasionally up with a feeling that she doesn't breath enough. And her heart will be racing- palpitations have also not been seen in her home sleep test. She described sudden falls still being a concern, neuropathy contributing to this.  her cognition is improved, by her own subjective assessment as well as MOCA test today : scored 26 out of 30 points. This is a normal range for her cognitive function. Ambien dependent , unable to go to sleep without sleep aid for several years now.  Her list of medication was reviewed and there have been no  recent changes. Her dog is her main social contact , besides her church family.   Interval history from 11/28/2015. [name] is here today and did excellent on today's memory testing. There is a variability between 26 and 22 points over the last 18 months. She mastered the trail making test, she recalled 3 out of 5 recall words. The patient had been advised in my last visit with her to avoid driving at night, she lives alone and she relies on her own transportation. She has not gotten lost. She has  an exercise regimen and she has implemented dietary changes. She is an excellent mood today and would like to reduce her Ambien from 12 point 5 at night to 10 mg which makes me very happy. I would like for her to continue with the Aricept I will follow her every 6 months.  Interval history  from 06/05/2016, [name] at the meanwhile 60 year old woman with a long-standing history for ill-defined consult. She can continue to drive without difficulties and is independent in activities of daily living she reports rhinitis but I attributed to Aricept, falls that seem to be neither syncopal, nor seizure, no mechanical in origin. Her falls are always propulsive, she falls forward she has very little warning or numb and she finds herself dominant how she got to the floor. Her mini entall test score 25 out of 30 points today which is stable and actually improved to a year ago. She is taking Lyrica for neuropathy and Lyrica is also a seizure medicine, I have wondered if Aricept may give her bradycardia but her heart rate today was 92.  Interval history from the tenth of January 2019, I have the pleasure of meeting today with [name], I follow her yearly and we usually look at her degree of fatigue, gait stability and cognitive status.  She scored today on a [place] cognitive assessment 23 out of 30 points and was last year 25 out of 30 points.  She missed 2 of the 5 recall words.  And the day of the week. She had another fall in October 2018 which she considered bad.  She describes that her legs entangled for some reason she fell onto her right hip she did not suffer fractures just a hematoma.  This happened on a non-slip even floor and she is still not sure what happened. No loss of conscience. Another fall at her sons house on the day before New years. "I sometimes cannot feel my feet and they end up entangled or in the wrong place at the wrong time.  This time I fell and slipped over a coffee table.  05-26-2018.RV , yearly for disability forms.  Mrs. Zebedee Iba- Freddi Che has become a grandmother 10 month ago and is very happy, son and his family live in [place]. She has survived an unknown primary cancer by now for many years. Still having neuropathy - as expected, and memory  problems.   REVIEW OF SYSTEMS: Out of a complete 14 system review of symptoms, the patient complains only of the following symptoms, insomnia, depression, memory loss and all other reviewed systems are negative.  ESS:10 FSS:53  ALLERGIES: Allergies  Allergen Reactions  . Codeine Nausea Only  . Orange Itching and Rash    HOME MEDICATIONS: Outpatient Medications Prior to Visit  Medication Sig Dispense Refill  . amphetamine-dextroamphetamine (ADDERALL XR) 20 MG 24 hr capsule Take 1 capsule (20 mg total) by mouth daily. 30 capsule 0  . aspirin 81 MG tablet  Take 81 mg by mouth daily. 2 TABS IN AM    . butalbital-acetaminophen-caffeine (FIORICET) 50-325-40 MG tablet TAKE 1 TABLET BY MOUTH EVERY 6 HOURS AS NEEDED FOR HEADACHE further refills should come from Neurology team . 10 tablet 0  . calcium carbonate (TUMS - DOSED IN MG ELEMENTAL CALCIUM) 500 MG chewable tablet Chew 1 tablet by mouth daily as needed for indigestion or heartburn.    . diclofenac (VOLTAREN) 75 MG EC tablet TK 1 T PO BID AFTER MEALS FOR INFLAMMATION / PAIN / SWELLING PRN ONLY  1  . donepezil (ARICEPT) 10 MG tablet Take 1 tablet (10 mg total) by mouth every morning. 90 tablet 2  . DULoxetine (CYMBALTA) 60 MG capsule TAKE 1 CAPSULE BY MOUTH DAILY 90 capsule 0  . Multiple Vitamin (MULTIVITAMIN WITH MINERALS) TABS tablet Take 1 tablet by mouth daily.    . orphenadrine (NORFLEX) 100 MG tablet Take 1 tablet (100 mg total) by mouth 2 (two) times daily as needed for muscle spasms. Schedule follow up visit for more refills. [phone] 180 tablet 0  . pregabalin (LYRICA) 200 MG capsule TAKE 1 CAPSULE(200 MG) BY MOUTH TWICE DAILY 60 capsule 2  . Rimegepant Sulfate (NURTEC) 75 MG TBDP Take one orally prn migraine headache .limit one a day 15 tablet 1  . simvastatin (ZOCOR) 40 MG tablet TAKE 1 TABLET BY MOUTH DAILY 90 tablet 0  . spironolactone-hydrochlorothiazide (ALDACTAZIDE) 25-25 MG tablet Take 1 tablet by mouth daily. For  hypertension. Stop the potassium supplementand maxide,.check blood tests in 2-3 weeks 30 tablet 3  . Vitamin D, Ergocalciferol, (DRISDOL) 1.25 MG (50000 UNIT) CAPS capsule TAKE 1 CAPSULE BY MOUTH 1 TIME A WEEK 13 capsule 2  . zolpidem (AMBIEN) 5 MG tablet TAKE 1 TO 2 TABLETS(5 TO 10 MG) BY MOUTH AT BEDTIME AS NEEDED 180 tablet 1   Facility-Administered Medications Prior to Visit  Medication Dose Route Frequency Provider Last Rate Last Admin  . 0.9 %  sodium chloride infusion  500 mL Intravenous Once Gatha Mayer, MD        PAST MEDICAL HISTORY: Past Medical History:  Diagnosis Date  . Adenocarcinoma 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 Adjustment disorder   . Allergic rhinitis   . Allergy   . Alopecia   . Anxiety and depression   . Arthritis   . Blood transfusion without reported diagnosis   . 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 Clotting disorder ([place])   . GERD (gastroesophageal reflux disease)   . HLD (hyperlipidemia)   . Hx pulmonary embolism   . Hypertension   . Iron deficiency anemia due to chronic blood loss 11/04/2016  . Metrorrhagia   . Neuromuscular disorder ([place])   . Neuropathy   . Obesity   . Ovarian cancer ([place]) 04/2008   ChemoTherapy  . 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 Raynaud's syndrome   . Right groin mass   . Squamous cell carcinoma of skin   . Ulcerative colitis ([place])   . Vision abnormalities   . Vitamin D deficiency     PAST SURGICAL HISTORY: Past Surgical History:  Procedure Laterality Date  . ABDOMINAL HYSTERECTOMY  05/2008   TAH/BSO  . AUGMENTATION MAMMAPLASTY Bilateral 10+ years  . BREAST BIOPSY Left 2020  . BREAST BIOPSY Right   . BREAST REDUCTION SURGERY  2000  . CESAREAN SECTION     1991  . COLONOSCOPY  2004; 12/07/10   hemorrhoids  . ESOPHAGOGASTRODUODENOSCOPY    .  GASTRIC BYPASS  1979  . REDUCTION MAMMAPLASTY Bilateral   . TOENAIL EXCISION Bilateral   . TONSILLECTOMY      FAMILY HISTORY: Family  History  Problem Relation Age of Onset  . Pulmonary embolism Mother   . Hypertension Father   . Breast cancer Maternal Grandmother        diagnosed in early 65s  . Hyperlipidemia Other   . Breast cancer Sister 29  . Colon cancer Neg Hx   . Esophageal cancer Neg Hx   . Rectal cancer Neg Hx   . Stomach cancer Neg Hx     SOCIAL HISTORY: Social History   Socioeconomic History  . Marital status: Divorced    Spouse name: Not on file  . Number of children: 1  . Years of education: Master's  . Highest education level: Not on file  Occupational History    Employer: UNEMPLOYED    Comment: disabled  Tobacco Use  . Smoking status: Current Every Day Smoker    Packs/day: 1.00    Years: 37.00    Pack years: 37.00    Types: Cigarettes    Start date: 06/16/1978  . Smokeless tobacco: Never Used  . Tobacco comment: 2- 7-16  STILL SMOKING  Vaping Use  . Vaping Use: Never used  Substance and Sexual Activity  . Alcohol use: Yes    Alcohol/week: 0.0 standard drinks    Comment: rare, 3 weekly  . Drug use: No  . Sexual activity: Not on file  Other Topics Concern  . Not on file  Social History Narrative   Divorced   1 son /1 adopted adult son  2 dogs    Sales occupation on disability for now   Regular exercise-yes money stress   Daily caffeine use 2-3 and 2 cups of coffee   On since a security disability since 2012   Patient is right-handed.   Patient has a Master's degree.   Social Determinants of Health   Financial Resource Strain: Not on file  Food Insecurity: Not on file  Transportation Needs: Not on file  Physical Activity: Not on file  Stress: Not on file  Social Connections: Not on file  Intimate Partner Violence: Not on file      PHYSICAL EXAM  There were no vitals filed for this visit. There is no height or weight on file to calculate BMI.  Generalized: Well developed, in no acute distress  Cardiology: normal rate and rhythm, no murmur noted Neurological  examination  Mentation: Alert oriented to time, place, history taking. Follows all commands speech and language fluent Cranial nerve II-XII: Pupils were equal round reactive to light. Extraocular movements were full, visual field were full on confrontational test. Facial sensation and strength were normal. Uvula tongue midline. Head turning and shoulder shrug  were normal and symmetric. Motor: The motor testing reveals 5 over 5 strength of all 4 extremities. Good symmetric motor tone is noted throughout.  Sensory: Sensory testing is intact to soft touch on all 4 extremities. No evidence of extinction is noted.  Coordination: Cerebellar testing reveals good finger-nose-finger and heel-to-shin bilaterally.  Gait and station: Gait is normal.     Montreal Cognitive Assessment  02/09/2020 11/25/2018 05/26/2018 05/22/2017 06/05/2016  Visuospatial/ Executive (0/5) 5 3 4 3 4   Naming (0/3) 3 3 3 3 3   Attention: Read list of digits (0/2) 1 0 1 0 1  Attention: Read list of letters (0/1) 1 1 1 1 1   Attention: Serial 7 subtraction starting  at 100 (0/3) 2 3 3 3 3   Language: Repeat phrase (0/2) 2 1 2 2 2   Language : Fluency (0/1) 1 1 1 1 1   Abstraction (0/2) 2 2 2 2 2   Delayed Recall (0/5) 3 3 3 3 2   Orientation (0/6) 5 5 5 5 6   Total 25 22 25 23 25   Adjusted Score (based on education) - - - - 25    DIAGNOSTIC DATA (LABS, IMAGING, TESTING) - I reviewed patient records, labs, notes, testing and imaging myself where available.  No flowsheet data found.   Lab Results  Component Value Date   WBC 6.3 04/19/2020   HGB 14.0 04/19/2020   HCT 43.1 04/19/2020   MCV 88.3 04/19/2020   PLT 197 04/19/2020      Component Value Date/Time   NA 137 06/21/2020 1603   NA 147 (H) 04/16/2017 1152   NA 139 12/27/2015 1156   K 4.1 06/21/2020 1603   K 3.4 04/16/2017 1152   K 3.9 12/27/2015 1156   CL 102 06/21/2020 1603   CL 101 04/16/2017 1152   CO2 27 06/21/2020 1603   CO2 30 04/16/2017 1152   CO2 27 12/27/2015  1156   GLUCOSE 94 06/21/2020 1603   GLUCOSE 89 04/16/2017 1152   BUN 20 06/21/2020 1603   BUN 18 04/16/2017 1152   BUN 14.9 12/27/2015 1156   CREATININE 1.06 06/21/2020 1603   CREATININE 0.91 04/19/2020 1505   CREATININE 1.07 (H) 03/01/2020 1626   CREATININE 0.9 12/27/2015 1156   CALCIUM 9.6 06/21/2020 1603   CALCIUM 9.8 04/16/2017 1152   CALCIUM 9.7 12/27/2015 1156   PROT 6.2 (L) 04/19/2020 1505   PROT 6.5 04/16/2017 1152   PROT 6.8 12/27/2015 1156   ALBUMIN 3.9 04/19/2020 1505   ALBUMIN 3.6 04/16/2017 1152   ALBUMIN 4.0 07/08/2016 1432   ALBUMIN 3.5 12/27/2015 1156   AST 26 04/19/2020 1505   AST 29 12/27/2015 1156   ALT 37 04/19/2020 1505   ALT 26 04/16/2017 1152   ALT 28 12/27/2015 1156   ALKPHOS 80 04/19/2020 1505   ALKPHOS 87 (H) 04/16/2017 1152   ALKPHOS 106 12/27/2015 1156   BILITOT 0.4 04/19/2020 1505   BILITOT 0.33 12/27/2015 1156   GFRNONAA >60 04/19/2020 1505   GFRNONAA 57 (L) 03/01/2020 1626   GFRAA 66 03/01/2020 1626   Lab Results  Component Value Date   CHOL 185 02/09/2019   HDL 45.50 02/09/2019   LDLCALC 101 (H) 02/09/2019   LDLDIRECT 98.7 09/04/2009   TRIG 189.0 (H) 02/09/2019   CHOLHDL 4 02/09/2019   Lab Results  Component Value Date   HGBA1C 6.2 02/09/2019   Lab Results  Component Value Date   VITAMINB12 594 01/27/2012   Lab Results  Component Value Date   TSH 3.90 03/01/2020     ASSESSMENT AND PLAN 60 y.o. 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[place]), Adjustment disorder, Allergic rhinitis, Allergy, Alopecia, Anxiety and depression, Arthritis, Blood transfusion without reported diagnosis, BRCA1 positive (01/27/2013), Clotting disorder ([place]), GERD (gastroesophageal reflux disease), HLD (hyperlipidemia), pulmonary embolism, Hypertension, Iron deficiency anemia due to chronic blood loss (11/04/2016), Metrorrhagia, Neuromuscular disorder ([place]), Neuropathy, Obesity, Ovarian cancer ([place]) (04/2008), Ovarian cancer genetic  susceptibility (01/27/2013), Raynaud's syndrome, Right groin mass, Squamous cell carcinoma of skin, Ulcerative colitis ([place]), Vision abnormalities, and Vitamin D deficiency. here with     ICD-10-CM   1. 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 Insomnia due to medical condition  G47.01  3. 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 Migraine with aura and without status migrainosus, not intractable  G43.109   6. Benign paroxysmal positional vertigo, bilateral  H81.13      Sasha is doing fairly well. She continues to have difficulty with sleep but is doing better with regular psychological counseling. She feels that mood is stable. Neuropathy pain is managed with Lyrica prescribed by oncology. She will continue Aricept as she does feel this helps. Memory complaints most likely related to previous chemo therapy as well as anxiety/depression. She is able to maintain her home and independent living. She was encouraged to work on healthy lifestyle habits and follow up closely with PCP, psychology and oncology. She will return to see [place] in 6 months. She verbalizes understanding and agreement with this plan.    No orders of the defined types were placed in this encounter.    No orders of the defined types were placed in this encounter.     I spent 30 minutes with the patient. 50% of this time was spent counseling and educating patient on plan of care and medications.    Debbora Presto, FNP-C 08/08/2020, 4:26 PM [facility] [address] [place], [place] [address] [phone]

## 2020-08-08 NOTE — Patient Instructions (Incomplete)
Below is our plan:  We will ***  Please make sure you are staying well hydrated. I recommend 50-60 ounces daily. Well balanced diet and regular exercise encouraged. Consistent sleep schedule with 6-8 hours recommended.   Please continue follow up with care team as directed.   Follow up with *** in ***  You may receive a survey regarding today's visit. I encourage you to leave honest feed back as I do use this information to improve patient care. Thank you for seeing me today!      Peripheral Neuropathy Peripheral neuropathy is a type of nerve damage. It affects nerves that carry signals between the spinal cord and the arms, legs, and the rest of the body (peripheral nerves). It does not affect nerves in the spinal cord or brain. In peripheral neuropathy, one nerve or a group of nerves may be damaged. Peripheral neuropathy is a broad category that includes many specific nerve disorders, like diabetic neuropathy, hereditary neuropathy, and carpal tunnel syndrome. What are the causes? This condition may be caused by:  Diabetes. This is the most common cause of peripheral neuropathy.  Nerve injury.  Pressure or stress on a nerve that lasts a long time.  Lack (deficiency) of B vitamins. This can result from alcoholism, poor diet, or a restricted diet.  Infections.  Autoimmune diseases, such as rheumatoid arthritis and systemic lupus erythematosus.  Nerve diseases that are passed from parent to child (inherited).  Some medicines, such as cancer medicines (chemotherapy).  Poisonous (toxic) substances, such as lead and mercury.  Too little blood flowing to the legs.  Kidney disease.  Thyroid disease. In some cases, the cause of this condition is not known. What are the signs or symptoms? Symptoms of this condition depend on which of your nerves is damaged. Common symptoms include:  Loss of feeling (numbness) in the feet, hands, or both.  Tingling in the feet, hands, or  both.  Burning pain.  Very sensitive skin.  Weakness.  Not being able to move a part of the body (paralysis).  Muscle twitching.  Clumsiness or poor coordination.  Loss of balance.  Not being able to control your bladder.  Feeling dizzy.  Sexual problems. How is this diagnosed? Diagnosing and finding the cause of peripheral neuropathy can be difficult. Your health care provider will take your medical history and do a physical exam. A neurological exam will also be done. This involves checking things that are affected by your brain, spinal cord, and nerves (nervous system). For example, your health care provider will check your reflexes, how you move, and what you can feel. You may have other tests, such as:  Blood tests.  Electromyogram (EMG) and nerve conduction tests. These tests check nerve function and how well the nerves are controlling the muscles.  Imaging tests, such as CT scans or MRI to rule out other causes of your symptoms.  Removing a small piece of nerve to be examined in a lab (nerve biopsy).  Removing and examining a small amount of the fluid that surrounds the brain and spinal cord (lumbar puncture). How is this treated? Treatment for this condition may involve:  Treating the underlying cause of the neuropathy, such as diabetes, kidney disease, or vitamin deficiencies.  Stopping medicines that can cause neuropathy, such as chemotherapy.  Medicine to help relieve pain. Medicines may include: ? Prescription or over-the-counter pain medicine. ? Antiseizure medicine. ? Antidepressants. ? Pain-relieving patches that are applied to painful areas of skin.  Surgery to relieve  pressure on a nerve or to destroy a nerve that is causing pain.  Physical therapy to help improve movement and balance.  Devices to help you move around (assistive devices). Follow these instructions at home: Medicines  Take over-the-counter and prescription medicines only as told  by your health care provider. Do not take any other medicines without first asking your health care provider.  Do not drive or use heavy machinery while taking prescription pain medicine. Lifestyle  Do not use any products that contain nicotine or tobacco, such as cigarettes and e-cigarettes. Smoking keeps blood from reaching damaged nerves. If you need help quitting, ask your health care provider.  Avoid or limit alcohol. Too much alcohol can cause a vitamin B deficiency, and vitamin B is needed for healthy nerves.  Eat a healthy diet. This includes: ? Eating foods that are high in fiber, such as fresh fruits and vegetables, whole grains, and beans. ? Limiting foods that are high in fat and processed sugars, such as fried or sweet foods.   General instructions  If you have diabetes, work closely with your health care provider to keep your blood sugar under control.  If you have numbness in your feet: ? Check every day for signs of injury or infection. Watch for redness, warmth, and swelling. ? Wear padded socks and comfortable shoes. These help protect your feet.  Develop a good support system. Living with peripheral neuropathy can be stressful. Consider talking with a mental health specialist or joining a support group.  Use assistive devices and attend physical therapy as told by your health care provider. This may include using a walker or a cane.  Keep all follow-up visits as told by your health care provider. This is important.   Contact a health care provider if:  You have new signs or symptoms of peripheral neuropathy.  You are struggling emotionally from dealing with peripheral neuropathy.  Your pain is not well-controlled. Get help right away if:  You have an injury or infection that is not healing normally.  You develop new weakness in an arm or leg.  You have fallen or do so frequently. Summary  Peripheral neuropathy is when the nerves in the arms, or legs are  damaged, resulting in numbness, weakness, or pain.  There are many causes of peripheral neuropathy, including diabetes, pinched nerves, vitamin deficiencies, autoimmune disease, and hereditary conditions.  Diagnosing and finding the cause of peripheral neuropathy can be difficult. Your health care provider will take your medical history, do a physical exam, and do tests, including blood tests and nerve function tests.  Treatment involves treating the underlying cause of the neuropathy and taking medicines to help control pain. Physical therapy and assistive devices may also help. This information is not intended to replace advice given to you by your health care provider. Make sure you discuss any questions you have with your health care provider. Document Revised: 02/08/2020 Document Reviewed: 02/08/2020 Elsevier Patient Education  [employer] Strategies  1. Use "WARM" strategy.  W= write it down  A= associate it  R= repeat it  M= make a mental note  2.   You can keep a Social worker.  Use a 3-ring notebook with sections for the following: calendar, important names and phone numbers,  medications, doctors' names/phone numbers, lists/reminders, and a section to journal what you did  each day.   3.    Use a calendar to write appointments down.  4.  Write yourself a schedule for the day.  This can be placed on the calendar or in a separate section of the Memory Notebook.  Keeping a  regular schedule can help memory.  5.    Use medication organizer with sections for each day or morning/evening pills.  You may need help loading it  6.    Keep a basket, or pegboard by the door.  Place items that you need to take out with you in the basket or on the pegboard.  You may also want to  include a message board for reminders.  7.    Use sticky notes.  Place sticky notes with reminders in a place where the task is performed.  For example: " turn off the  stove"  placed by the stove, "lock the door" placed on the door at eye level, " take your medications" on  the bathroom mirror or by the place where you normally take your medications.  8.    Use alarms/timers.  Use while cooking to remind yourself to check on food or as a reminder to take your medicine, or as a  reminder to make a call, or as a reminder to perform another task, etc.

## 2020-08-09 ENCOUNTER — Ambulatory Visit: Payer: Medicare Other | Admitting: Family Medicine

## 2020-08-09 ENCOUNTER — Other Ambulatory Visit: Payer: Self-pay

## 2020-08-09 ENCOUNTER — Encounter: Payer: Medicare Other | Admitting: Psychology

## 2020-08-09 ENCOUNTER — Encounter: Payer: Self-pay | Admitting: Family Medicine

## 2020-08-09 DIAGNOSIS — R4189 Other symptoms and signs involving cognitive functions and awareness: Secondary | ICD-10-CM

## 2020-08-09 DIAGNOSIS — R29818 Other symptoms and signs involving the nervous system: Secondary | ICD-10-CM

## 2020-08-09 DIAGNOSIS — G4701 Insomnia due to medical condition: Secondary | ICD-10-CM

## 2020-08-09 DIAGNOSIS — G479 Sleep disorder, unspecified: Secondary | ICD-10-CM | POA: Diagnosis not present

## 2020-08-09 DIAGNOSIS — F331 Major depressive disorder, recurrent, moderate: Secondary | ICD-10-CM

## 2020-08-09 DIAGNOSIS — H8113 Benign paroxysmal vertigo, bilateral: Secondary | ICD-10-CM

## 2020-08-09 DIAGNOSIS — F988 Other specified behavioral and emotional disorders with onset usually occurring in childhood and adolescence: Secondary | ICD-10-CM

## 2020-08-09 DIAGNOSIS — F329 Major depressive disorder, single episode, unspecified: Secondary | ICD-10-CM

## 2020-08-09 DIAGNOSIS — F4310 Post-traumatic stress disorder, unspecified: Secondary | ICD-10-CM | POA: Diagnosis not present

## 2020-08-09 DIAGNOSIS — G43109 Migraine with aura, not intractable, without status migrainosus: Secondary | ICD-10-CM

## 2020-08-09 DIAGNOSIS — Z8543 Personal history of malignant neoplasm of ovary: Secondary | ICD-10-CM

## 2020-08-10 ENCOUNTER — Encounter: Payer: Self-pay | Admitting: Psychology

## 2020-08-10 ENCOUNTER — Other Ambulatory Visit: Payer: Self-pay | Admitting: Internal Medicine

## 2020-08-10 NOTE — Progress Notes (Signed)
Neuropsychology Visit  Patient:  [name]   DOB: 24-Mar-1961  MR Number: [id number]  Location: [facility] [place], [place] [id number] [place] [address] Dept: [phone]  Date of Service: 08/09/2020  Start: 11 AM End: 12 PM  Today's visit was an in person visit that was conducted in my outpatient clinic office and is 1 hour in duration.  The patient myself were present.  Duration of Service: 1 Hour  Provider/Observer:     Edgardo Roys PsyD  Chief Complaint:      Chief Complaint  Patient presents with  . Pain  . Depression  . 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  The patient has had residual cognitive changes similar to possible impacts of chemotherapy as well as significant residual pain symptoms.  The patient was diagnosed and treated for cancer 10 years ago and continues to have ongoing follow-up regarding possible recurrence.  The patient has had neuropathy, insomnia and memory loss post chemotherapy.  The patient has depressive symptoms that have developed and has a hard time motivating and a hard time keeping focused on life and staying engaged.  The patient is continued struggle with issues related to coping and adjustment as well as ongoing cognitive difficulties and difficulties with completing task.  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   Treatment Interventions:  Therapeutic interventions  associated with chronic pain, residual effects of chemotherapy interventions symptoms associated with PTSD, depression and severe avoidance behaviors.  Participation Level:   Active  Participation Quality:  Appropriate and Attentive      Behavioral Observation:  Well Groomed, Alert, and Appropriate.   Current Psychosocial Factors: The patient has had a lot of stressors recently related to psychosocial stressors around her mother surgery, difficulty completing significant and important paperwork as well as medical issues including an infection in her foot as well as skin rash/lesioning.  Content of Session:   Today, we reviewed the current symptoms and continue to work on therapeutic interventions.  The patient has been able to effectively complete some of the insurance paperwork that she had been having difficulty getting completed with significant avoidance behaviors and motivating factors.  The patient reports that recently she has had a severe worsening of her sleep difficulties and her symptoms have been worse.  We focused very heavily today on her sleep patterns and need for getting back into some of the strategies we have developed earlier in therapy.  The patient reports that she continues to struggle with longstanding issues and residual effects of her cancer and cancer treatment.  Effectiveness of Interventions: The patient was very active and open during the visit we had an effective appointment.  Target Goals:   Target goals include working on building coping skills and strategies around issues of depression and residual effects of her chemotherapy including residual cognitive deficits.  Goals Last Reviewed:   08/09/2020   Impression/Diagnosis:   [name] is a 60 year old female referred by  Larey Seat, MD for neuropsychological consultation due to ongoing coping and adjustment issues and cognitive changes following diagnosis and treatment for cancer which was felt to be a  primary ovarian cancer that had spread to lymph nodes.  The patient has had residual cognitive changes similar to possible impacts of chemotherapy as well as significant residual pain symptoms.  The patient was diagnosed and treated for cancer 10 years ago and continues to have ongoing follow-up regarding possible recurrence.  The patient has had neuropathy, insomnia and memory loss post chemotherapy.  The patient has depressive symptoms that have developed and has a hard time motivating and a hard time keeping focused on life and staying engaged.  The patient describes depressive symptoms, including motivation, feelings of helplessness and hopelessness and anxiety about recurrence of her cancer.  The patient also describes ongoing chronic pain symptoms and neuropathy as well as insomnia and memory loss.  Today we worked specifically on behavioral adaptations and building coping skills around issues of her difficulty completing necessary daily activities and daily requirements as her cognitive change in cognitive impairments and impact they have had on emotional status and self-esteem issues are having a very significant deleterious effect on her.  The patient is continued with symptoms and continues to be unable to work as her cognitive, stress, pain symptoms all leave her unable to perform full-time gainful employment.  The patient has a long history of significant avoidance behaviors particular on completing complex paperwork.  This is part of her overall condition and is not simply the patient not completing aspects that she should.  The patient's avoidance is associated with anxiety and is part of her debilitating symptoms.  The patient has a number of symptoms that have an overlap in their symptom spectrum.  These include residual PTSD-like symptoms, depression, significant pain and possible complex regional pain syndrome symptoms etc.  Reviewing her overall symptoms and status I do think that she may  be a very good candidate for ketamine infusion in the future.  I have asked the patient to speak with her oncologist as these types of treatments are being done through oncology in the [facility] network.  If it is not something that she could get to the [facility] network they are also providing the services through [facility] at the [employer] as well.  This may take some time as getting insurance coverage may require active efforts but we will continue to look at this possibility going forward.  Today we continue to talk about the wide spectrum of symptoms including PTSD symptoms, depression, significant pain, and possible complex regional pain syndrome symptoms.  I continue to think that she is a good candidate for infusion therapy if that is possible.  The patient has an appointment coming up with her oncologist and will address that possibility there as some infusion therapies are being conducted in the oncology department at this point.  If not, she is going to contact her insurance to see if it is something that they may pay for and also look at care through [facility].  Diagnosis:   Neurocognitive deficits  Major depressive disorder, recurrent episode, moderate ([place])  Post traumatic stress disorder (PTSD)  Sleep disturbance    Ilean Skill, Psy.D. Clinical Psychologist Neuropsychologist

## 2020-08-10 NOTE — Telephone Encounter (Signed)
Last lipid labs- 02/09/2019 Last office visit- 06/21/2020  Next office visit not scheduled

## 2020-08-10 NOTE — Telephone Encounter (Signed)
Arrange  A fasting lipid panel for patient  Ok to refill x 90 days in interim  Until get lab back

## 2020-08-10 NOTE — Telephone Encounter (Signed)
LVM for patient to call and schedule lab appointment then will send in med refill.

## 2020-08-18 ENCOUNTER — Inpatient Hospital Stay: Payer: Medicare Other

## 2020-08-18 ENCOUNTER — Inpatient Hospital Stay: Payer: Medicare Other | Attending: Internal Medicine

## 2020-08-18 NOTE — Telephone Encounter (Signed)
LVM for patient to call schedule lab appointment.   Med sent to pharmacy

## 2020-08-24 ENCOUNTER — Encounter: Payer: Medicare Other | Attending: Psychology | Admitting: Psychology

## 2020-08-24 ENCOUNTER — Encounter: Payer: Self-pay | Admitting: Psychology

## 2020-08-24 ENCOUNTER — Encounter: Payer: Medicare Other | Admitting: Psychology

## 2020-08-24 DIAGNOSIS — R29818 Other symptoms and signs involving the nervous system: Secondary | ICD-10-CM | POA: Diagnosis not present

## 2020-08-24 DIAGNOSIS — F331 Major depressive disorder, recurrent, moderate: Secondary | ICD-10-CM | POA: Insufficient documentation

## 2020-08-24 DIAGNOSIS — F4310 Post-traumatic stress disorder, unspecified: Secondary | ICD-10-CM | POA: Diagnosis not present

## 2020-08-24 DIAGNOSIS — R4189 Other symptoms and signs involving cognitive functions and awareness: Secondary | ICD-10-CM | POA: Insufficient documentation

## 2020-08-24 DIAGNOSIS — G479 Sleep disorder, unspecified: Secondary | ICD-10-CM | POA: Diagnosis not present

## 2020-08-24 NOTE — Progress Notes (Signed)
Neuropsychology Visit  Patient:  [name]   DOB: 1960/09/16  MR Number: [id number]  Location: [facility] [place], [place] [id number] [place] [address] Dept: [phone]  Date of Service: 08/24/2020  Start: 2 PM End: 3 PM  TELEHEALTH NOTE  Due to national recommendations of social distancing due to COVID 19, an audio/video telehealth visit is felt to be most appropriate for this patient at this time.  See Chart message from today for the patient's consent to telehealth from [place].     I verified that I am speaking with the correct person using two identifiers.  Location of patient: Home  Location of provider: Office Method of communication: Epic Telemed Video/Audio Names of participants : April scheduling, myself obtaining consent and vitals if available Established patient Time spent on call: 1 hour  Duration of Service: 1 Hour  Provider/Observer:     Edgardo Roys PsyD  Chief Complaint:      Chief Complaint  Patient presents with  . Pain  . Depression  . Stress  . 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  The patient has had residual cognitive changes similar to possible impacts of chemotherapy as well as significant residual pain symptoms.  The patient was diagnosed and treated for cancer 10 years ago and continues to have ongoing follow-up regarding possible recurrence.  The patient has had neuropathy, insomnia and memory loss post chemotherapy.  The patient has depressive symptoms that have developed and has  a hard time motivating and a hard time keeping focused on life and staying engaged.  The patient is continued struggle with issues related to coping and adjustment as well as ongoing cognitive difficulties and difficulties with completing task.  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   Treatment Interventions:  Therapeutic interventions associated with chronic pain, residual effects of chemotherapy interventions symptoms associated with PTSD, depression and severe avoidance behaviors.  Participation Level:   Active  Participation Quality:  Appropriate and Attentive      Behavioral Observation:  Well Groomed, Alert, and Appropriate.   Current Psychosocial Factors: Patient doing better with psychosocial stressors but with severe sleep deficits hard to do much at day which gets her behind on life demands.  Content of Session:   Today, we reviewed the current symptoms and continue to work on therapeutic interventions.  Patient with significant sleep depravation and has tried what she can as far as medications and behavioral changes/sleep hygine.    Effectiveness of Interventions: The patient was very active and open during the visit we had an effective appointment.  Target Goals:   Target goals include working on building coping skills and strategies around issues of depression and residual effects of her chemotherapy including residual cognitive deficits.  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  The patient has had  residual cognitive changes similar to possible impacts of chemotherapy as well as significant residual pain symptoms.  The patient was diagnosed and treated for cancer 10 years ago and continues to have ongoing follow-up regarding possible recurrence.  The patient has had neuropathy, insomnia and memory loss post chemotherapy.  The patient has depressive symptoms that have developed and has a hard time motivating and a hard time keeping focused on life and staying engaged.  The patient describes depressive symptoms, including motivation, feelings of helplessness and hopelessness and anxiety about recurrence of her cancer.  The patient also describes ongoing chronic pain symptoms and neuropathy as well as insomnia and memory loss.  Today we worked specifically on behavioral adaptations and building coping skills around issues of her difficulty completing necessary daily activities and daily requirements as her cognitive change in cognitive impairments and impact they have had on emotional status and self-esteem issues are having a very significant deleterious effect on her.  The patient is continued with symptoms and continues to be unable to work as her cognitive, stress, pain symptoms all leave her unable to perform full-time gainful employment.  The patient has a long history of significant avoidance behaviors particular on completing complex paperwork.  This is part of her overall condition and is not simply the patient not completing aspects that she should.  The patient's avoidance is associated with anxiety and is part of her debilitating symptoms.  The patient has a number of symptoms that have an overlap in their symptom spectrum.  These include residual PTSD-like symptoms, depression, significant pain and possible complex regional pain syndrome symptoms etc.  Reviewing her overall symptoms and status I do think that she may be a very good candidate for ketamine infusion in the future.  I have asked  the patient to speak with her oncologist as these types of treatments are being done through oncology in the [facility] network.  If it is not something that she could get to the [facility] network they are also providing the services through [facility] at the [employer] as well.  This may take some time as getting insurance coverage may require active efforts but we will continue to look at this possibility going forward.  Today we continue to talk about the wide spectrum of symptoms including PTSD symptoms, depression, significant pain, and possible complex regional pain syndrome symptoms.  I continue to think that she is a good candidate for infusion therapy if that is possible.  The patient has an appointment coming up with her oncologist and will address that possibility there as some infusion therapies are being conducted in the oncology department at this point.  If not, she is going to contact her insurance to see if it is something that they may pay for and also look at care through [facility] pain [facility] at [place] message to neurology about having them consider low dose of Seroquel as she has taken as much as 15 mg of Ambien and still not slept and not taking naps during day.  No sudden onset of sleep during day.    Diagnosis:   Neurocognitive deficits  Major depressive disorder, recurrent episode, moderate ([facility])  Post traumatic stress disorder (PTSD)  Sleep disturbance  Cognitive impairment    Ilean Skill, Psy.D. Clinical Psychologist Neuropsychologist

## 2020-09-12 ENCOUNTER — Other Ambulatory Visit: Payer: Self-pay | Admitting: Internal Medicine

## 2020-09-12 DIAGNOSIS — H53143 Visual discomfort, bilateral: Secondary | ICD-10-CM

## 2020-09-12 DIAGNOSIS — G43109 Migraine with aura, not intractable, without status migrainosus: Secondary | ICD-10-CM

## 2020-09-12 DIAGNOSIS — H8113 Benign paroxysmal vertigo, bilateral: Secondary | ICD-10-CM

## 2020-09-12 DIAGNOSIS — G4701 Insomnia due to medical condition: Secondary | ICD-10-CM

## 2020-09-12 NOTE — Telephone Encounter (Signed)
Last OV: 06/21/2020 Last Refill: 06/22/2020 Future OV: None scheduled

## 2020-09-19 ENCOUNTER — Other Ambulatory Visit: Payer: Self-pay | Admitting: Neurology

## 2020-09-19 ENCOUNTER — Telehealth: Payer: Self-pay | Admitting: Neurology

## 2020-09-19 DIAGNOSIS — L438 Other lichen planus: Secondary | ICD-10-CM | POA: Diagnosis not present

## 2020-09-19 DIAGNOSIS — L810 Postinflammatory hyperpigmentation: Secondary | ICD-10-CM | POA: Diagnosis not present

## 2020-09-19 DIAGNOSIS — L298 Other pruritus: Secondary | ICD-10-CM | POA: Diagnosis not present

## 2020-09-19 MED ORDER — AMPHETAMINE-DEXTROAMPHET ER 20 MG PO CP24: 20.0000 mg | ORAL_CAPSULE | Freq: Every day | ORAL | 0 refills | Status: DC

## 2020-09-19 NOTE — Telephone Encounter (Signed)
Pt request refill amphetamine-dextroamphetamine (ADDERALL XR) 20 MG 24 hr capsule at [facility] #[id number]

## 2020-09-19 NOTE — Telephone Encounter (Signed)
I have routed this request to Dr Krista Blue, in Dr Dohmeier's absence for review. The pt is due for the medication and [place] registry was verified.

## 2020-09-20 NOTE — Telephone Encounter (Signed)
See my last note I do not want to refill this medicine I would like her to contact neurology who sees her for her migraine headaches and other neurocognitive problems to prescribe this medicine if they feel fit. These report this to patient.

## 2020-09-21 NOTE — Telephone Encounter (Signed)
I spoke with the patient and informed her of the message below. Patient stated that she would send the request to neurology.

## 2020-10-05 ENCOUNTER — Other Ambulatory Visit: Payer: Self-pay | Admitting: Neurology

## 2020-10-05 DIAGNOSIS — G4701 Insomnia due to medical condition: Secondary | ICD-10-CM

## 2020-10-05 DIAGNOSIS — H53143 Visual discomfort, bilateral: Secondary | ICD-10-CM

## 2020-10-05 DIAGNOSIS — H8113 Benign paroxysmal vertigo, bilateral: Secondary | ICD-10-CM

## 2020-10-05 DIAGNOSIS — G43109 Migraine with aura, not intractable, without status migrainosus: Secondary | ICD-10-CM

## 2020-10-05 MED ORDER — BUTALBITAL-APAP-CAFFEINE 50-325-40 MG PO TABS: ORAL_TABLET | ORAL | 0 refills | Status: DC

## 2020-10-05 NOTE — Telephone Encounter (Signed)
Pt request refill butalbital-acetaminophen-caffeine (FIORICET) 50-325-40 MG tablet at [facility] [phone]

## 2020-10-05 NOTE — Telephone Encounter (Signed)
Pt is due. Last filled 06/22/2020. PCP ordered in feb 2022 but prior to that Dr Dohmeier was ordering prescriber and pt was asked by PCP to get further refills through [place].  Due for refill

## 2020-10-10 ENCOUNTER — Telehealth: Payer: Self-pay | Admitting: Internal Medicine

## 2020-10-10 ENCOUNTER — Other Ambulatory Visit: Payer: Self-pay | Admitting: Internal Medicine

## 2020-10-10 NOTE — Telephone Encounter (Signed)
Pt call and stated she founded a lump in her breast a stated she need a order for a diagnostic mammogram and want a call back.

## 2020-10-10 NOTE — Telephone Encounter (Signed)
Needs visit to evaluate and refer as appropriate

## 2020-10-11 NOTE — Telephone Encounter (Signed)
Could not leave voice message due to mailbox being full.

## 2020-10-12 ENCOUNTER — Other Ambulatory Visit: Payer: Self-pay | Admitting: Internal Medicine

## 2020-10-12 NOTE — Telephone Encounter (Signed)
Patient stated that she has an annual mammography scheduled and questioned whether she could just have her annual exam done and if they notice anything abnormal during her visit then could she have a diagnostic mammography performed? Please advise.

## 2020-10-18 ENCOUNTER — Inpatient Hospital Stay: Payer: Medicare Other | Admitting: Hematology & Oncology

## 2020-10-18 ENCOUNTER — Inpatient Hospital Stay: Payer: Medicare Other

## 2020-10-18 ENCOUNTER — Inpatient Hospital Stay: Payer: Medicare Other | Attending: Internal Medicine

## 2020-10-19 ENCOUNTER — Other Ambulatory Visit: Payer: Self-pay | Admitting: Internal Medicine

## 2020-10-22 NOTE — Telephone Encounter (Signed)
If she has abnormality or concern about  breast symptoms  she needs a visit with a clinician to describe the problem . And order appropriate testing .  If she is asymptomatic then she can get a screening mammo by herself without our orders .

## 2020-10-23 ENCOUNTER — Telehealth: Payer: Self-pay

## 2020-10-23 NOTE — Telephone Encounter (Signed)
Left a message for the patient to return my call.  

## 2020-10-25 NOTE — Telephone Encounter (Signed)
Left a message for the pt to return my call.  

## 2020-10-30 NOTE — Progress Notes (Signed)
Chief Complaint  Patient presents with   Breast Mass    Patient complains of lump on her breast, x2 weeks    HPI: [name] 60 y.o. come in for problem with breast  concerns girlfriend noted some left sided upper breast thickening or nodule that patient herself cannot feel and she is due for a routine mammogram and wants to get it checked out.  See messages. She gets her mammograms at the breast center.  Edition her blood pressure has been better but had taken medicine today will be due for blood work soon with Dr. Marin Olp.  She is off her potassium and will need this checked.  She is also seeing the dermatologist Dr. 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  She forgets things when not on medicine.  ROS: See pertinent positives and negatives per HPI.  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   Raynaud's syndrome    Right groin mass    Squamous cell carcinoma of skin    Ulcerative colitis ([facility])    Vision abnormalities    Vitamin D deficiency     Family History  Problem Relation Age of Onset   Pulmonary embolism Mother    Hypertension Father    Breast cancer Maternal Grandmother         diagnosed in early 9s   Hyperlipidemia Other    Breast cancer Sister 35   Colon cancer Neg Hx    Esophageal cancer Neg Hx    Rectal cancer Neg Hx    Stomach cancer Neg Hx     Social History   Socioeconomic History   Marital status: Divorced    Spouse name: Not on file   Number of children: 1   Years of education: Master's   Highest education level: Not on file  Occupational History    Employer: UNEMPLOYED    Comment: disabled  Tobacco Use   Smoking status: Every Day    Packs/day: 1.00    Years: 37.00    Pack years: 37.00    Types: Cigarettes    Start date: 06/16/1978   Smokeless tobacco: Never   Tobacco comments:    2- 7-16  STILL SMOKING  Vaping Use   Vaping Use: Never used  Substance and Sexual Activity   Alcohol use: Yes    Alcohol/week: 0.0 standard drinks    Comment: rare, 3 weekly   Drug use: No   Sexual activity: Not on file  Other Topics Concern   Not on file  Social History Narrative   Divorced   1 son /1  adopted adult son  2 dogs    Sales occupation on disability for now   Regular exercise-yes money stress   Daily caffeine use 2-3 and 2 cups of coffee   On since a security disability since 2012   Patient is right-handed.   Patient has a Master's degree.   Social Determinants of Health   Financial Resource Strain: Not on file  Food Insecurity: Not on file  Transportation Needs: Not on file  Physical Activity: Not on file  Stress: Not on file  Social Connections: Not on file    Outpatient Medications Prior to Visit  Medication Sig Dispense Refill   amphetamine-dextroamphetamine (ADDERALL XR) 20 MG 24 hr capsule Take 1 capsule (20 mg total) by mouth daily. 30 capsule 0   aspirin 81 MG tablet Take 81 mg by mouth daily. 2 TABS IN AM     butalbital-acetaminophen-caffeine (FIORICET) 50-325-40 MG tablet TAKE 1 TABLET BY MOUTH EVERY 6 HOURS AS NEEDED FOR HEADACHE further refills should come from Neurology team . 10 tablet 0   calcium carbonate (TUMS -  DOSED IN MG ELEMENTAL CALCIUM) 500 MG chewable tablet Chew 1 tablet by mouth daily as needed for indigestion or heartburn.     diclofenac (VOLTAREN) 75 MG EC tablet TK 1 T PO BID AFTER MEALS FOR INFLAMMATION / PAIN / SWELLING PRN ONLY  1   donepezil (ARICEPT) 10 MG tablet Take 1 tablet (10 mg total) by mouth every morning. 90 tablet 2   DULoxetine (CYMBALTA) 60 MG capsule TAKE 1 CAPSULE BY MOUTH DAILY 90 capsule 0   Multiple Vitamin (MULTIVITAMIN WITH MINERALS) TABS tablet Take 1 tablet by mouth daily.     orphenadrine (NORFLEX) 100 MG tablet Take 1 tablet (100 mg total) by mouth 2 (two) times daily as needed for muscle spasms. Schedule follow up visit for more refills. [phone] 180 tablet 0   pregabalin (LYRICA) 200 MG capsule TAKE 1 CAPSULE(200 MG) BY MOUTH TWICE DAILY 60 capsule 2   Rimegepant Sulfate (NURTEC) 75 MG TBDP Take one orally prn migraine headache .limit one a day 15 tablet 1   simvastatin (ZOCOR) 40 MG tablet TAKE 1 TABLET BY MOUTH DAILY 90 tablet 0   spironolactone-hydrochlorothiazide (ALDACTAZIDE) 25-25 MG tablet TAKE 1 TABLET BY MOUTH DAILY FOR HIGH BLOOD PRESSURE. STOP THE POTASSIUM SUPPLEMENTAND MAXIDE,.CHECK BLOOD TESTS IN 2-3 WEEKS 90 tablet 0   Vitamin D, Ergocalciferol, (DRISDOL) 1.25 MG (50000 UNIT) CAPS capsule TAKE 1 CAPSULE BY MOUTH 1 TIME A WEEK 13 capsule 2   zolpidem (AMBIEN) 5 MG tablet TAKE 1 TO 2 TABLETS(5 TO 10 MG) BY MOUTH AT BEDTIME AS NEEDED 180 tablet 1   Facility-Administered Medications Prior to Visit  Medication Dose Route Frequency Provider Last Rate Last Admin   0.9 %  sodium chloride infusion  500 mL Intravenous Once Gatha Mayer, MD         EXAM:  BP (!) 144/88 (BP Location: Left Arm, Patient Position: Sitting, Cuff Size: Normal)   Pulse 92   Temp 98 F (36.7 C) (Oral)   Ht _0  (1.727 m)   Wt 214 lb 12.8 oz (97.4 kg)   SpO2 94%   BMI 32.66 kg/m   Body mass index is 32.66 kg/m.  GENERAL: vitals reviewed and listed above, alert,  oriented, appears well hydrated and in no acute distress HEENT: atraumatic, conjunctiva  clear, no obvious abnormalities on inspection of external nose and ears OP : n masked Breasts surgical scars no obvious dimpling.  Pendulous breasts left upper outer quadrant has some thickening uncertain if symmetrical but no specific nodule.  Axilla appears clear without adenopathy. CV: HRRR, no clubbing cyanosis or  peripheral edema nl cap refill  MS: moves all extremities without noticeable focal  abnormality PSYCH: pleasant and cooperative, no obvious depression or anxiety Lab Results  Component Value Date   WBC 6.3 04/19/2020   HGB 14.0 04/19/2020   HCT 43.1 04/19/2020   PLT 197 04/19/2020   GLUCOSE 94 06/21/2020   CHOL 185 02/09/2019   TRIG 189.0 (H) 02/09/2019   HDL 45.50 02/09/2019   LDLDIRECT 98.7 09/04/2009   LDLCALC 101 (H) 02/09/2019   ALT 37 04/19/2020   AST 26 04/19/2020   NA 137 06/21/2020   K 4.1 06/21/2020   CL 102 06/21/2020   CREATININE 1.06 06/21/2020   BUN 20 06/21/2020   CO2 27 06/21/2020   TSH 3.90 03/01/2020   INR 0.86 06/02/2013   HGBA1C 6.2 02/09/2019   BP Readings from Last 3 Encounters:  10/31/20 (!) 144/88  06/21/20 (!) 135/91  06/20/20 (!) 150/105    ASSESSMENT AND PLAN:  Discussed the following assessment and plan:  Abnormal breast finding - Plan: MM Digital Diagnostic Unilat L, MM Digital Diagnostic Bilat, US BREAST LTD UNI LEFT INC AXILLA  History of breast surgery - Plan: MM Digital Diagnostic Unilat L, MM Digital Diagnostic Bilat, US BREAST LTD UNI LEFT INC AXILLA  BRCA1 positive - Plan: MM Digital Diagnostic Unilat L, MM Digital Diagnostic Bilat, US BREAST LTD UNI LEFT INC AXILLA  Essential hypertension  Medication management Difficult to tell if abnormality or thickening send for diagnostic mammogram ultrasound if appropriate order will be sent to the breast center. Need follow-up potassium level check at next blood draw. -Patient advised to  return or notify health care team  if  new concerns arise. 30 minutes revewie exam and plan counsel Patient Instructions   Dont feel  a discreet area  but  thickening?  Will order diagnostic  mammogram and  poss [place].  To the Breast Center.   Standley Brooking. Florine Sprenkle M.D.

## 2020-10-30 NOTE — Telephone Encounter (Signed)
Left a message for the pt to return my call.  

## 2020-10-31 ENCOUNTER — Other Ambulatory Visit: Payer: Self-pay

## 2020-10-31 ENCOUNTER — Ambulatory Visit (INDEPENDENT_AMBULATORY_CARE_PROVIDER_SITE_OTHER): Payer: Medicare Other | Admitting: Internal Medicine

## 2020-10-31 ENCOUNTER — Encounter: Payer: Self-pay | Admitting: Internal Medicine

## 2020-10-31 VITALS — BP 144/88 | HR 92 | Temp 98.0°F | Ht 68.0 in | Wt 214.8 lb

## 2020-10-31 DIAGNOSIS — Z1501 Genetic susceptibility to malignant neoplasm of breast: Secondary | ICD-10-CM | POA: Diagnosis not present

## 2020-10-31 DIAGNOSIS — I10 Essential (primary) hypertension: Secondary | ICD-10-CM | POA: Diagnosis not present

## 2020-10-31 DIAGNOSIS — N6459 Other signs and symptoms in breast: Secondary | ICD-10-CM | POA: Diagnosis not present

## 2020-10-31 DIAGNOSIS — Z9889 Other specified postprocedural states: Secondary | ICD-10-CM | POA: Diagnosis not present

## 2020-10-31 DIAGNOSIS — Z1509 Genetic susceptibility to other malignant neoplasm: Secondary | ICD-10-CM

## 2020-10-31 DIAGNOSIS — Z79899 Other long term (current) drug therapy: Secondary | ICD-10-CM

## 2020-10-31 NOTE — Patient Instructions (Addendum)
  Dont feel  a discreet area  but  thickening?  Will order diagnostic  mammogram and  poss [place].  To the [facility] Center.

## 2020-10-31 NOTE — Telephone Encounter (Signed)
Pt was seen in office today regarding issue.

## 2020-11-02 ENCOUNTER — Ambulatory Visit: Payer: Medicare Other | Admitting: Psychology

## 2020-11-07 ENCOUNTER — Encounter: Payer: Self-pay | Admitting: Family Medicine

## 2020-11-07 ENCOUNTER — Other Ambulatory Visit: Payer: Self-pay

## 2020-11-07 ENCOUNTER — Ambulatory Visit: Payer: Medicare Other | Admitting: Family Medicine

## 2020-11-07 VITALS — BP 147/98 | HR 84 | Ht 68.0 in | Wt 216.0 lb

## 2020-11-07 DIAGNOSIS — G4701 Insomnia due to medical condition: Secondary | ICD-10-CM

## 2020-11-07 DIAGNOSIS — F988 Other specified behavioral and emotional disorders with onset usually occurring in childhood and adolescence: Secondary | ICD-10-CM | POA: Diagnosis not present

## 2020-11-07 DIAGNOSIS — F329 Major depressive disorder, single episode, unspecified: Secondary | ICD-10-CM | POA: Diagnosis not present

## 2020-11-07 DIAGNOSIS — G43109 Migraine with aura, not intractable, without status migrainosus: Secondary | ICD-10-CM | POA: Diagnosis not present

## 2020-11-07 DIAGNOSIS — G3184 Mild cognitive impairment, so stated: Secondary | ICD-10-CM

## 2020-11-07 MED ORDER — AMPHETAMINE-DEXTROAMPHET ER 20 MG PO CP24: 20.0000 mg | ORAL_CAPSULE | Freq: Every day | ORAL | 0 refills | Status: DC

## 2020-11-07 NOTE — Patient Instructions (Addendum)
Below is our plan:  We will continue Adderall 67m daily, Aricept 110mdaily and Ambien 5-1079mt bedtime. Fioricet as needed for migraine abortion. I would recommend you reach out to psychology for cognitive behavioral therapy for depression.   Please make sure you are staying well hydrated. I recommend 50-60 ounces daily. Well balanced diet and regular exercise encouraged. Consistent sleep schedule with 6-8 hours recommended.   Please continue follow up with care team as directed.   Follow up with us [place] 6 months   You may receive a survey regarding today's visit. I encourage you to leave honest feed back as I do use this information to improve patient care. Thank you for seeing me today!

## 2020-11-07 NOTE — Progress Notes (Signed)
PATIENT: [name] DOB: 1961/02/10  REASON FOR VISIT: follow up HISTORY FROM: patient  Chief Complaint  Patient presents with   Follow-up    Rm 2, alone. Here for migraine f/u, 4-5x a month last 2-3 days. This month shes only had once, beging of the month. Reports really bad insomnia, forgetfulness and anxiety. Pt has not taken Adderall for the past month, due to possible misplacement of medication.       HISTORY OF PRESENT ILLNESS: 11/07/2020 ALL:  She returns for follow up for migraines, inattention, cognitive impairments and insomnia. At last visit with Dr Brett Fairy, 01/2021, she added Adderall for concerns of inattention and fatigue. She has continued Adderall XR 109m daily. She does feel it has helped significantly with daytime fatigue. She has not taken in the past month due to misplacing her medication. Last refill was 09/19/2020. She feels that she is sleeping better when taking Adderall as she is not sleeping during the day.   She has continued Ambien 5-127mat bedtime for insomnia and feels it works well. She is sleeping fairly well. Migraines are well managed. She usually has 4-5 migraines days per month. This month she has only had one migraine day. Fioricet works well for abortive therapy. Nurtec not covered with her insurance.   She continues Aricept 1071mnd tolerating well. Follow up neurocognitive eval with Dr RodBrita Romp 2021 was unremarkable with exception of depression. Dr Dohmeier recommended CBT at last visit. She has not seen anyone. She feels that she is doing okay. She does often cancel plans and prefers to be alone. She continues duloxetine 3m44mily written by oncology following worsening depression after starting Lyrica for neuropathy (managed by PCP). Neuropathy is worse at night, not bothersome during the day when she is occupied.    02/09/2020 CD: RN notes-rm 10. presents for follow up visit. pt states that he has had a pinching sensation in back area  which started 2 weeks ago and its intermittent. she has been seeing Dr RodeSima Matas feels that has been helping. pt said she had some skin concerns for  which she is seeing specialist. She developed a dark spot in her left eye saw her ophthalmologist, a laser procedure is planned for 10/4.   CD 02-09-2020. I have the pleasure meeting again today again [name] , a multiracial femele patient, who was last seen by our nurse practitioner Rosabelle Jupin.  The patient underwent neuropsychological testing and the last date of service had been 9-21 2021.  This was a follow-up with Dr. JohnIlean Skillm she had seen a year earlier for baseline neuropsychological evaluation.  She has ongoing adjustment issues, cognitive changes following her treatment for cancer.  She had some residual cognitive changes that were attributed to chemotherapy effects.  She also has a neuropathy that is likely related to either the primary malignancy or the treatment thereof.  Dr. RodeJefm MilesD,  discussed with her to work on therapeutic interventions along coping skills/strategies with regards to depression and to better cope with the residual effects of chemotherapy- however , he felt that this was a moderate recurrent episode of a major depression and did not there was no retesting of her cognitive function.  The diagnosis of neurocognitive deficits had been made a year earlier. She has been reporting ongoing hypersomnia- Epworth sleepiness score 15/ 14 points, she has better sleep quality at night. Bouts of insomnia but much less long and frequent.  She attributed this to  Dr Gaspar Cola behavior therapy, improving also anxiety and depression. Depression manifesting I feeling overwhelmed, feeling paralyzed by the overwhelmed feeling. " get no- thing done ". Continues to take antidepressant medication. Lack of energy: " tired every day and then not sleeping at night "   08/05/2019 ALL: [name] is a 60 y.o.  female here today for follow up. She is seeing psychology. She feels that it helps. Sleep is stable. She is trying to only take 1 tablet of Ambien versus two. She continues Aricept 25m. She feels that memory waxes and wanes. She lives alone. She is able to perform all ADL's independently. She is able to drive. She makes "wrong turns" frequently but is able to get to her destination without difficulty. She declines MOCA today stating "that is not my problem." She feels that chemo has affected her ability to stay focused. She uses written instructions for making coffee each morning. She uses sticky notes. She gets overwhelmed with paying bills so she has hired someone to help her with this. She continues Lyrica 20103mdaily and feels this is very helpful. Lyrica is prescribed by oncology. She continues to see oncology every 3-4 months.   HISTORY: (copied from Dr Dohmeier's note on 11/25/2018)  I have the pleasure of seeing [name] D. [place], a [age]ear old woman of african and caucasian mixed race who reports less trouble sleeping but trouble with memory, progressive.  She has neuropathy, had migraine, is at high risk of falling. 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 She has had spells that were syncopal, and some looking like seizures.  She reports feeling isolated during the corona pandemic, has seen neither children , grandchild, nor  her parents.she is tearful , appears very depressed. She takes Cymbalta for mood.      2016 : [name] is a [age]ear old female with a history of neuropathy,cognitive changes and insomnia. She returns today for follow-up. The patient continues to take Ambien for insomnia. The patient states that if she does not take this medication she will not be able to go to sleep. The patient currently takes Lyrica 150 mg twice a day for neuropathy. Patient states that the burning and tingling is located in the toes extending to  the mid foot bilaterally. She also has burning and tingling in the fingers that extends to the mid hand. Denies any changes with her gait or balance. That she had a fall while standing and watching fireworks. She states that the right leg went numb and cause her to fall. She states that this is the first time this has happened. She states that the numbness resolved. He states that she is not had any additional episodes of numbness. Patient feels that her memory has remained same. She states that she is able to do simple tasks but things that require more complex thinking she has difficulty with. For example she has trouble following plots in a TV show. She states that she has directions for how to make coffee posted. She is able to complete all ADLs independently. She operates a moTeacher, musicithout difficulty. She states that occasionally she'll take a wrong turn to a familiar place. She continues to take Aricept 10 mg daily. Patient states that she did have a sleep study area and she is unsure if she should be wearing oxygen at bedtime. She states that she had a dental procedure and the technician noted that her oxygen saturation  dropped while she was sleeping. She denies any new neurological issues. She returns today for an evaluation.  HISTORY 05/18/14 (CD): [name] is here today for her yearly revisit she is doing remarkably well knowing about her history of metastatic cancer with unknown primary to more she seems to have battled her disease very well.   She has lost some weight since her last visit,  her blood pressure is well controlled.  The HST study performed on 10-23-13 showed obstructive sleep apnea not to be present,  but some hypoxemia at night there were 109 minutes of oxygenation at 88% or less.  Since this test was a home sleep test there is a higher likelihood of artifact. The patient does  1-2 times a night  feel air hungry. She does wake occasionally up with a feeling that she doesn't   breath enough. And her heart will be racing-  palpitations have also not been seen in her home sleep test. She described sudden falls still being a concern, neuropathy contributing to this.    her cognition is improved, by her own subjective assessment as well as  MOCA test today : scored 26 out of 30 points. This is a normal range for her cognitive function.  Ambien dependent , unable to go to sleep without sleep aid for several years now.   Her list of medication was reviewed and there have been no recent changes. Her dog is her main social contact , besides her church family.    Interval history from 11/28/2015. [name] is here today and did excellent on today's memory testing. There is a variability between 26 and 22 points over the last 18 months. She mastered the trail making test, she recalled 3 out of 5 recall words. The patient had been advised in my last visit with her to avoid driving at night, she lives alone and she relies on her own transportation. She has not gotten lost. She has  an exercise regimen and she has implemented dietary changes. She is an excellent mood today and would like to reduce her Ambien from 12 point 5 at night to 10 mg which makes me very happy. I would like for her to continue with the Aricept I will follow her every 6 months.   Interval history from 06/05/2016, [name] at the meanwhile 60 year old woman with a long-standing history for ill-defined consult. She can continue to drive without difficulties and is independent in activities of daily living she reports rhinitis but I attributed to Aricept, falls that seem to be neither syncopal, nor seizure, no mechanical in origin. Her falls are always propulsive, she falls forward she has very little warning or numb and she finds herself dominant how she got to the floor. Her mini entall test score 25 out of 30 points today which is stable and actually improved to a year ago. She is taking Lyrica for  neuropathy and Lyrica is also a seizure medicine, I have wondered if Aricept may give her bradycardia but her heart rate today was 92.   Interval history from the tenth of January 2019, I have the pleasure of meeting today with [name], I follow her yearly and we usually look at her degree of fatigue, gait stability and cognitive status.  She scored today on a Montreal cognitive assessment 23 out of 30 points and was last year 25 out of 30 points.  She missed 2 of the 5 recall words.  And the day of the week.  She had another fall in October 2018 which she considered bad.  She describes that her legs entangled for some reason she fell onto her right hip she did not suffer fractures just a hematoma.  This happened on a non-slip even floor and she is still not sure what happened. No loss of conscience. Another fall at her sons house on the day before New years. "I sometimes cannot feel my feet and they end up entangled or in the wrong place at the wrong time.  This time I fell and slipped over a coffee table.   05-26-2018.RV , yearly for disability forms.  [name] has become a grandmother 15 month ago and is very happy, son and his family live in [place]. She has survived an unknown primary cancer by now for many years. Still having neuropathy - as expected, and memory problems.   REVIEW OF SYSTEMS: Out of a complete 14 system review of symptoms, the patient complains only of the following symptoms, insomnia, depression, memory loss, neuropathy, and all other reviewed systems are negative.   ALLERGIES: Allergies  Allergen Reactions   Codeine Nausea Only   Orange Itching and Rash    HOME MEDICATIONS: Outpatient Medications Prior to Visit  Medication Sig Dispense Refill   aspirin 81 MG tablet Take 81 mg by mouth daily. 2 TABS IN AM     butalbital-acetaminophen-caffeine (FIORICET) 50-325-40 MG tablet TAKE 1 TABLET BY MOUTH EVERY 6 HOURS AS NEEDED FOR HEADACHE further refills  should come from Neurology team . 10 tablet 0   calcium carbonate (TUMS - DOSED IN MG ELEMENTAL CALCIUM) 500 MG chewable tablet Chew 1 tablet by mouth daily as needed for indigestion or heartburn.     diclofenac (VOLTAREN) 75 MG EC tablet TK 1 T PO BID AFTER MEALS FOR INFLAMMATION / PAIN / SWELLING PRN ONLY  1   donepezil (ARICEPT) 10 MG tablet Take 1 tablet (10 mg total) by mouth every morning. 90 tablet 2   DULoxetine (CYMBALTA) 60 MG capsule TAKE 1 CAPSULE BY MOUTH DAILY 90 capsule 0   Multiple Vitamin (MULTIVITAMIN WITH MINERALS) TABS tablet Take 1 tablet by mouth daily.     orphenadrine (NORFLEX) 100 MG tablet Take 1 tablet (100 mg total) by mouth 2 (two) times daily as needed for muscle spasms. Schedule follow up visit for more refills. [phone] 180 tablet 0   pregabalin (LYRICA) 200 MG capsule TAKE 1 CAPSULE(200 MG) BY MOUTH TWICE DAILY 60 capsule 2   Rimegepant Sulfate (NURTEC) 75 MG TBDP Take one orally prn migraine headache .limit one a day 15 tablet 1   simvastatin (ZOCOR) 40 MG tablet TAKE 1 TABLET BY MOUTH DAILY 90 tablet 0   spironolactone-hydrochlorothiazide (ALDACTAZIDE) 25-25 MG tablet TAKE 1 TABLET BY MOUTH DAILY FOR HIGH BLOOD PRESSURE. STOP THE POTASSIUM SUPPLEMENTAND MAXIDE,.CHECK BLOOD TESTS IN 2-3 WEEKS 90 tablet 0   Vitamin D, Ergocalciferol, (DRISDOL) 1.25 MG (50000 UNIT) CAPS capsule TAKE 1 CAPSULE BY MOUTH 1 TIME A WEEK 13 capsule 2   zolpidem (AMBIEN) 5 MG tablet TAKE 1 TO 2 TABLETS(5 TO 10 MG) BY MOUTH AT BEDTIME AS NEEDED 180 tablet 1   amphetamine-dextroamphetamine (ADDERALL XR) 20 MG 24 hr capsule Take 1 capsule (20 mg total) by mouth daily. 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   Clotting disorder (H[facility])    GERD (gastroesophageal reflux disease)    HLD (hyperlipidemia)    Hx pulmonary embolism    Hypertension    Iron deficiency anemia due to chronic blood loss 11/04/2016   Metrorrhagia    Neuromuscular disorder (HCC)    Neuropathy    Obesity    Ovarian cancer ([facility]) 04/2008   ChemoTherapy   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   Raynaud's syndrome    Right groin mass    Squamous cell carcinoma of skin    Ulcerative colitis ([facility])    Vision abnormalities    Vitamin D deficiency     PAST SURGICAL HISTORY: Past Surgical History:  Procedure Laterality Date   ABDOMINAL HYSTERECTOMY  05/2008   TAH/BSO   AUGMENTATION MAMMAPLASTY Bilateral 10+ years   BREAST BIOPSY Left 2020   BREAST BIOPSY Right    BREAST REDUCTION SURGERY  2000   CESAREAN SECTION     1991   COLONOSCOPY  2004; 12/07/10   hemorrhoids   ESOPHAGOGASTRODUODENOSCOPY     GASTRIC BYPASS  1979   REDUCTION MAMMAPLASTY Bilateral    TOENAIL EXCISION Bilateral    TONSILLECTOMY      FAMILY HISTORY: Family History  Problem Relation Age of Onset   Pulmonary embolism Mother    Hypertension Father    Breast cancer Maternal Grandmother        diagnosed in early 12s   Hyperlipidemia Other    Breast cancer Sister 23   Colon cancer Neg Hx    Esophageal cancer Neg Hx    Rectal cancer Neg Hx    Stomach cancer Neg Hx     SOCIAL HISTORY: Social History   Socioeconomic History   Marital status: Divorced    Spouse name: Not on file   Number of children: 1   Years of education: Master's   Highest education level: Not on file  Occupational History    Employer: UNEMPLOYED    Comment: disabled  Tobacco Use   Smoking status: Every Day    Packs/day: 1.00    Years: 37.00     Pack years: 37.00    Types: Cigarettes    Start date: 06/16/1978   Smokeless tobacco: Never   Tobacco comments:    2- 7-16  STILL SMOKING  Vaping Use   Vaping Use: Never used  Substance and Sexual Activity   Alcohol use: Yes    Alcohol/week: 0.0 standard drinks    Comment: rare, 3 weekly   Drug use: No   Sexual activity: Not on file  Other Topics Concern   Not on file  Social History Narrative   Divorced   1 son /1 adopted adult son  2 dogs    Sales occupation on disability for now   Regular exercise-yes money stress   Daily caffeine use 2-3 and 2 cups of coffee   On since a security disability since 2012   Patient is right-handed.   Patient has a Master's degree.   Social Determinants of Health   Financial Resource Strain: Not on file  Food Insecurity: Not on file  Transportation Needs:  Not on file  Physical Activity: Not on file  Stress: Not on file  Social Connections: Not on file  Intimate Partner Violence: Not on file      PHYSICAL EXAM  Vitals:   11/07/20 1328  BP: (!) 147/98  Pulse: 84  Weight: 216 lb (98 kg)  Height: 5' 8"  (1.727 m)    Body mass index is 32.84 kg/m.  Generalized: Well developed, in no acute distress  Cardiology: normal rate and rhythm, no murmur noted Neurological examination  Mentation: Alert oriented to time, place, history taking. Follows all commands speech and language fluent Cranial nerve II-XII: Pupils were equal round reactive to light. Extraocular movements were full, visual field were full on confrontational test. Facial sensation and strength were normal. Head turning and shoulder shrug  were normal and symmetric. Motor: The motor testing reveals 5 over 5 strength of all 4 extremities. Good symmetric motor tone is noted throughout.  Gait and station: Gait is normal.     Montreal Cognitive Assessment  02/09/2020 11/25/2018 05/26/2018 05/22/2017 06/05/2016  Visuospatial/ Executive (0/5) 5 3 4 3 4   Naming (0/3) 3 3 3 3 3    Attention: Read list of digits (0/2) 1 0 1 0 1  Attention: Read list of letters (0/1) 1 1 1 1 1   Attention: Serial 7 subtraction starting at 100 (0/3) 2 3 3 3 3   Language: Repeat phrase (0/2) 2 1 2 2 2   Language : Fluency (0/1) 1 1 1 1 1   Abstraction (0/2) 2 2 2 2 2   Delayed Recall (0/5) 3 3 3 3 2   Orientation (0/6) 5 5 5 5 6   Total 25 22 25 23 25   Adjusted Score (based on education) - - - - 25    DIAGNOSTIC DATA (LABS, IMAGING, TESTING) - I reviewed patient records, labs, notes, testing and imaging myself where available.  No flowsheet data found.   Lab Results  Component Value Date   WBC 6.3 04/19/2020   HGB 14.0 04/19/2020   HCT 43.1 04/19/2020   MCV 88.3 04/19/2020   PLT 197 04/19/2020      Component Value Date/Time   NA 137 06/21/2020 1603   NA 147 (H) 04/16/2017 1152   NA 139 12/27/2015 1156   K 4.1 06/21/2020 1603   K 3.4 04/16/2017 1152   K 3.9 12/27/2015 1156   CL 102 06/21/2020 1603   CL 101 04/16/2017 1152   CO2 27 06/21/2020 1603   CO2 30 04/16/2017 1152   CO2 27 12/27/2015 1156   GLUCOSE 94 06/21/2020 1603   GLUCOSE 89 04/16/2017 1152   BUN 20 06/21/2020 1603   BUN 18 04/16/2017 1152   BUN 14.9 12/27/2015 1156   CREATININE 1.06 06/21/2020 1603   CREATININE 0.91 04/19/2020 1505   CREATININE 1.07 (H) 03/01/2020 1626   CREATININE 0.9 12/27/2015 1156   CALCIUM 9.6 06/21/2020 1603   CALCIUM 9.8 04/16/2017 1152   CALCIUM 9.7 12/27/2015 1156   PROT 6.2 (L) 04/19/2020 1505   PROT 6.5 04/16/2017 1152   PROT 6.8 12/27/2015 1156   ALBUMIN 3.9 04/19/2020 1505   ALBUMIN 3.6 04/16/2017 1152   ALBUMIN 4.0 07/08/2016 1432   ALBUMIN 3.5 12/27/2015 1156   AST 26 04/19/2020 1505   AST 29 12/27/2015 1156   ALT 37 04/19/2020 1505   ALT 26 04/16/2017 1152   ALT 28 12/27/2015 1156   ALKPHOS 80 04/19/2020 1505   ALKPHOS 87 (H) 04/16/2017 1152   ALKPHOS 106 12/27/2015 1156  BILITOT 0.4 04/19/2020 1505   BILITOT 0.33 12/27/2015 1156   GFRNONAA >60 04/19/2020  1505   GFRNONAA 57 (L) 03/01/2020 1626   GFRAA 66 03/01/2020 1626   Lab Results  Component Value Date   CHOL 185 02/09/2019   HDL 45.50 02/09/2019   LDLCALC 101 (H) 02/09/2019   LDLDIRECT 98.7 09/04/2009   TRIG 189.0 (H) 02/09/2019   CHOLHDL 4 02/09/2019   Lab Results  Component Value Date   HGBA1C 6.2 02/09/2019   Lab Results  Component Value Date   CXKGYJEH63 149 01/27/2012   Lab Results  Component Value Date   TSH 3.90 03/01/2020     ASSESSMENT AND PLAN 60 y.o. 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[facility]), Adjustment disorder, Allergic rhinitis, Allergy, Alopecia, Anxiety and depression, Arthritis, Blood transfusion without reported diagnosis, BRCA1 positive (01/27/2013), Clotting disorder ([facility]), GERD (gastroesophageal reflux disease), HLD (hyperlipidemia), pulmonary embolism, Hypertension, Iron deficiency anemia due to chronic blood loss (11/04/2016), Metrorrhagia, Neuromuscular disorder ([facility]), Neuropathy, Obesity, Ovarian cancer ([facility]) (04/2008), Ovarian cancer genetic susceptibility (01/27/2013), Raynaud's syndrome, Right groin mass, Squamous cell carcinoma of skin, Ulcerative colitis ([facility]), Vision abnormalities, and Vitamin D deficiency. here with     ICD-10-CM   1. MCI (mild cognitive impairment) with memory loss  G31.84     2. 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 Insomnia due to medical condition  G47.01     5. Migraine with aura and without status migrainosus, not intractable  G43.109        Sharisa is doing fairly well. She feels that memory, sleep and mood are stable. We will continue Adderall XR 20m daily as this has been very helpful in managing daytime sleepiness. PDMP shows last refill 09/19/2020. Unfortunately, she has not take in over a month due to misplacing her prescription. She will keep medications stored in a safe area of the home. We will continue Fioricet as needed  for abortive therapy. No using regularly. She will continue Ambien 5-131mat bedtime.  She will continue Aricept as she does feel this helps. Memory complaints most likely related to previous chemo therapy as well as anxiety/depression. She is able to maintain her home and independent living. She will consider reaching out for CBT. She was previously seeing Dr RoSima MatasNeuropathy pain is managed with Lyrica prescribed by PCP/oncology. She was encouraged to work on healthy lifestyle habits and follow up closely with PCP and oncology. She will return to see usKorean 6 months. Will repeat ESS on Adderall and MOCA if needed. She verbalizes understanding and agreement with this plan.    No orders of the defined types were placed in this encounter.    Meds ordered this encounter  Medications   amphetamine-dextroamphetamine (ADDERALL XR) 20 MG 24 hr capsule    Sig: Take 1 capsule (20 mg total) by mouth daily.    Dispense:  30 capsule    Refill:  0    Order Specific Question:   Supervising Provider    Answer:   AHMelvenia Beam1[[id number]]       HYI [id number]-C 11/07/2020, 2:07 PM [facility] [address]SuCookrHaroldNC [phone]

## 2020-11-08 ENCOUNTER — Other Ambulatory Visit: Payer: Self-pay | Admitting: Family

## 2020-11-08 ENCOUNTER — Other Ambulatory Visit: Payer: Self-pay | Admitting: Neurology

## 2020-11-08 ENCOUNTER — Other Ambulatory Visit: Payer: Self-pay | Admitting: Internal Medicine

## 2020-11-08 DIAGNOSIS — H53143 Visual discomfort, bilateral: Secondary | ICD-10-CM

## 2020-11-08 DIAGNOSIS — H8113 Benign paroxysmal vertigo, bilateral: Secondary | ICD-10-CM

## 2020-11-08 DIAGNOSIS — G63 Polyneuropathy in diseases classified elsewhere: Secondary | ICD-10-CM

## 2020-11-08 DIAGNOSIS — G43109 Migraine with aura, not intractable, without status migrainosus: Secondary | ICD-10-CM

## 2020-11-08 DIAGNOSIS — G4701 Insomnia due to medical condition: Secondary | ICD-10-CM

## 2020-11-21 ENCOUNTER — Other Ambulatory Visit: Payer: Self-pay | Admitting: Hematology & Oncology

## 2020-11-21 ENCOUNTER — Other Ambulatory Visit: Payer: Self-pay | Admitting: Neurology

## 2020-11-21 DIAGNOSIS — H53143 Visual discomfort, bilateral: Secondary | ICD-10-CM

## 2020-11-21 DIAGNOSIS — C801 Malignant (primary) neoplasm, unspecified: Secondary | ICD-10-CM

## 2020-11-21 DIAGNOSIS — H8113 Benign paroxysmal vertigo, bilateral: Secondary | ICD-10-CM

## 2020-11-21 DIAGNOSIS — F4323 Adjustment disorder with mixed anxiety and depressed mood: Secondary | ICD-10-CM

## 2020-11-21 DIAGNOSIS — G43109 Migraine with aura, not intractable, without status migrainosus: Secondary | ICD-10-CM

## 2020-11-21 DIAGNOSIS — G4701 Insomnia due to medical condition: Secondary | ICD-10-CM

## 2020-12-04 ENCOUNTER — Encounter: Payer: Medicare Other | Admitting: Psychology

## 2020-12-11 ENCOUNTER — Inpatient Hospital Stay: Payer: Medicare Other | Attending: Internal Medicine

## 2020-12-11 ENCOUNTER — Other Ambulatory Visit: Payer: Self-pay

## 2020-12-11 DIAGNOSIS — Z8589 Personal history of malignant neoplasm of other organs and systems: Secondary | ICD-10-CM | POA: Insufficient documentation

## 2020-12-11 DIAGNOSIS — Z95828 Presence of other vascular implants and grafts: Secondary | ICD-10-CM

## 2020-12-11 DIAGNOSIS — Z452 Encounter for adjustment and management of vascular access device: Secondary | ICD-10-CM | POA: Diagnosis not present

## 2020-12-11 DIAGNOSIS — C801 Malignant (primary) neoplasm, unspecified: Secondary | ICD-10-CM

## 2020-12-11 DIAGNOSIS — D500 Iron deficiency anemia secondary to blood loss (chronic): Secondary | ICD-10-CM

## 2020-12-11 DIAGNOSIS — G63 Polyneuropathy in diseases classified elsewhere: Secondary | ICD-10-CM

## 2020-12-11 MED ORDER — SODIUM CHLORIDE 0.9% FLUSH
10.0000 mL | INTRAVENOUS | Status: DC | PRN
  Administered 2020-12-11: 10 mL
  Filled 2020-12-11: qty 10

## 2020-12-11 MED ORDER — HEPARIN SOD (PORK) LOCK FLUSH 100 UNIT/ML IV SOLN
500.0000 [IU] | Freq: Once | INTRAVENOUS | Status: AC | PRN
  Administered 2020-12-11: 500 [IU]
  Filled 2020-12-11: qty 5

## 2020-12-15 ENCOUNTER — Other Ambulatory Visit: Payer: Medicare Other

## 2021-01-02 ENCOUNTER — Other Ambulatory Visit: Payer: Medicare Other

## 2021-01-02 ENCOUNTER — Other Ambulatory Visit: Payer: Self-pay | Admitting: Neurology

## 2021-01-02 ENCOUNTER — Other Ambulatory Visit: Payer: Self-pay | Admitting: Family Medicine

## 2021-01-02 ENCOUNTER — Other Ambulatory Visit: Payer: Self-pay

## 2021-01-02 NOTE — Telephone Encounter (Signed)
Patient is due for adderall per [place] drug registry last filled 11/07/2020. The fiorcet was already sent to the pharmacy in July with 5 refills. Patient should get refill from the pharmacy. Will send the adderall to Dr Brett Fairy for review

## 2021-01-02 NOTE — Telephone Encounter (Signed)
Received refill request for Ambien 77m. Last OV was on 10/18/20.  Next OV is scheduled for 05/29/21.  Last RX was written on 07/08/20 for 180 tabs.   [place] [facility] Database has been reviewed.

## 2021-01-02 NOTE — Telephone Encounter (Signed)
Pt called requesting refill for amphetamine-dextroamphetamine (ADDERALL XR) 20 MG 24 hr capsule and butalbital-acetaminophen-caffeine (FIORICET) 50-325-40 MG tablet. [place] [phone].

## 2021-01-03 ENCOUNTER — Encounter: Payer: Medicare Other | Admitting: Psychology

## 2021-01-03 ENCOUNTER — Encounter: Payer: Medicare Other | Attending: Psychology | Admitting: Psychology

## 2021-01-03 ENCOUNTER — Encounter: Payer: Self-pay | Admitting: Psychology

## 2021-01-03 DIAGNOSIS — F4310 Post-traumatic stress disorder, unspecified: Secondary | ICD-10-CM

## 2021-01-03 DIAGNOSIS — R4189 Other symptoms and signs involving cognitive functions and awareness: Secondary | ICD-10-CM

## 2021-01-03 DIAGNOSIS — F331 Major depressive disorder, recurrent, moderate: Secondary | ICD-10-CM

## 2021-01-03 DIAGNOSIS — R29818 Other symptoms and signs involving the nervous system: Secondary | ICD-10-CM | POA: Diagnosis not present

## 2021-01-03 DIAGNOSIS — G479 Sleep disorder, unspecified: Secondary | ICD-10-CM | POA: Diagnosis not present

## 2021-01-03 MED ORDER — ZOLPIDEM TARTRATE 5 MG PO TABS: ORAL_TABLET | ORAL | 1 refills | Status: DC

## 2021-01-03 NOTE — Progress Notes (Signed)
Neuropsychology Visit  Patient:  [name]   DOB: [birth year]  MR Number: [id number]  Location: [facility] [place], [place] [id number] [place] [id number] Dept: [phone]  Date of Service: 01/03/2021  Start: 3 PM End: 4 PM  TELEHEALTH NOTE  Due to national recommendations of social distancing due to COVID 19, an audio/video telehealth visit is felt to be most appropriate for this patient at this time.  See Chart message from today for the patient's consent to telehealth from [place].     I verified that I am speaking with the correct person using two identifiers.  Location of patient: Home  Location of provider: Office Method of communication: Epic Telemed Video/Audio Names of participants : April scheduling, myself obtaining consent and vitals if available Established patient Time spent on call: 1 hour  Duration of Service: 1 Hour  Provider/Observer:     Edgardo Roys PsyD  Chief Complaint:      Chief Complaint  Patient presents with   Pain   Depression   Sleeping Problem   Memory Loss    Reason For Service:     [name] is a 60 year old female referred by Larey Seat, MD for neuropsychological consultation due to ongoing coping and adjustment issues and cognitive changes following diagnosis and treatment for cancer which was 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  The patient has had residual cognitive changes similar to possible impacts of chemotherapy as well as significant residual pain symptoms.  The patient was diagnosed and treated for cancer 10 years ago and continues to have ongoing follow-up regarding possible recurrence.  The patient has had neuropathy, insomnia and memory loss post chemotherapy.  The patient has depressive symptoms that have developed and  has a hard time motivating and a hard time keeping focused on life and staying engaged.  The patient is continued struggle with issues related to coping and adjustment as well as ongoing cognitive difficulties and difficulties with completing task.  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   Treatment Interventions:  Therapeutic interventions associated with chronic pain, residual effects of chemotherapy interventions symptoms associated with PTSD, depression and severe avoidance behaviors.  Participation Level:   Active  Participation Quality:  Appropriate and Attentive      Behavioral Observation:  Well Groomed, Alert, and Appropriate.   Current Psychosocial Factors: Patient reports that she has been having more fatigue and pain, practically in hips.  Hard to get going and do things and it was worse after being back from [place].  Trip was "great" but during that time decided she was going to end the relationship she had been in for some time.  Felt a weight off her shoulders.  Had mature talk with her and understood.  Content of Session:   Today, we reviewed the current symptoms and continue to work on therapeutic interventions.  Patient with significant sleep depravation and has tried what she can as far as medications and behavioral changes/sleep hygine.    Effectiveness of Interventions: The patient was very active and open during the visit we had an effective appointment.  Target Goals:   Target goals include working on building coping skills and strategies  around issues of depression and residual effects of her chemotherapy including residual cognitive deficits.  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  The patient has had residual cognitive changes similar to possible impacts of chemotherapy as well as significant residual pain symptoms.  The patient was diagnosed and treated for cancer 10 years ago and continues to have ongoing follow-up regarding possible recurrence.  The patient has had neuropathy, insomnia and memory loss post chemotherapy.  The patient has depressive symptoms that have developed and has a hard time motivating and a hard time keeping focused on life and staying engaged.   The patient describes depressive symptoms, including motivation, feelings of helplessness and hopelessness and anxiety about recurrence of her cancer.  The patient also describes ongoing chronic pain symptoms and neuropathy as well as insomnia and memory loss.  Today we worked specifically on behavioral adaptations and building coping skills around issues of her difficulty completing necessary daily activities and daily requirements as her cognitive change in cognitive impairments and impact they have had on emotional status and self-esteem issues are having a very significant deleterious effect on her.  The patient is continued with symptoms and continues to be unable to work as her cognitive, stress, pain symptoms all leave her unable to perform full-time gainful employment.  The patient has a long history of significant avoidance behaviors particular on completing complex paperwork.  This is part of her overall condition and is not simply the patient not completing aspects that she should.  The patient's avoidance is associated with anxiety and is part of her debilitating symptoms.  The patient has a number of symptoms that have an overlap in their symptom spectrum.  These include residual PTSD-like symptoms,  depression, significant pain and possible complex regional pain syndrome symptoms etc.  Reviewing her overall symptoms and status I do think that she may be a very good candidate for ketamine infusion in the future.  I have asked the patient to speak with her oncologist as these types of treatments are being done through oncology in the [facility] network.  If it is not something that she could get to the [facility] network they are also providing the services through [facility] at the [employer] as well.  This may take some time as getting insurance coverage may require active efforts but we will continue to look at this possibility going forward.  Today we continue to talk about the wide spectrum of symptoms including PTSD symptoms, depression, significant pain, and possible complex regional pain syndrome symptoms.  I continue to think that she is a good candidate for infusion therapy if that is possible.  The patient has an appointment coming up with her oncologist and will address that possibility there as some infusion therapies are being conducted in the oncology department at this point.  If not, she is going to contact her insurance to see if it is something that they may pay for and also look at care through [facility] [place] message to neurology about having them consider low dose of Seroquel as she has taken as much as 15 mg of Ambien and still not slept and not taking naps  during day.  No sudden onset of sleep during day.    Diagnosis:   Neurocognitive deficits  Major depressive disorder, recurrent episode, moderate (HCC)  Post traumatic stress disorder (PTSD)  Sleep disturbance    Ilean Skill, Psy.D. Clinical Psychologist Neuropsychologist

## 2021-01-03 NOTE — Addendum Note (Signed)
Addended by: Larey Seat on: 01/03/2021 12:41 PM   Modules accepted: Orders

## 2021-01-06 ENCOUNTER — Other Ambulatory Visit: Payer: Self-pay | Admitting: Internal Medicine

## 2021-01-06 ENCOUNTER — Other Ambulatory Visit: Payer: Self-pay | Admitting: Family

## 2021-01-06 DIAGNOSIS — C801 Malignant (primary) neoplasm, unspecified: Secondary | ICD-10-CM

## 2021-01-08 ENCOUNTER — Ambulatory Visit: Payer: Medicare Other | Admitting: Psychology

## 2021-01-09 ENCOUNTER — Telehealth: Payer: Self-pay

## 2021-01-09 ENCOUNTER — Other Ambulatory Visit: Payer: Self-pay | Admitting: Family Medicine

## 2021-01-09 ENCOUNTER — Telehealth: Payer: Self-pay | Admitting: Internal Medicine

## 2021-01-09 MED ORDER — AMPHETAMINE-DEXTROAMPHET ER 20 MG PO CP24: 20.0000 mg | ORAL_CAPSULE | Freq: Every day | ORAL | 0 refills | Status: DC

## 2021-01-09 NOTE — Telephone Encounter (Signed)
Patient called asking if she could start taking a medication that was prescribed years ago I could not locate the medication on list. Pt would like a call back

## 2021-01-09 NOTE — Telephone Encounter (Signed)
Pt called requesting refill for amphetamine-dextroamphetamine (ADDERALL XR) 20 MG 24 hr capsule. [place] [phone].

## 2021-01-09 NOTE — Telephone Encounter (Signed)
Received refill request for Adderall 36m. Last OV was on 11/07/20.  Next OV is scheduled for 05/29/21 .  Last RX was written on 6/28 for 30 tabs.   [place] [facility] has been reviewed.

## 2021-01-09 NOTE — Telephone Encounter (Signed)
I spoke with the pt and she requested a refill on Orphenadrine (Norflex) 100 mg. Pt reported that she has not been able to sleep due to aches and pains and she has been having discomfort. Pt stated that she would like to restart medication as it has previously helped her. Pt has been previously prescribed this by PCP. Last Refill: 12/10/2019 Disp: 180 R:0

## 2021-01-09 NOTE — Telephone Encounter (Signed)
Patient called earlier to ask about refill on prescription. Spoke with [place], but got disconnected. Would like a call back on [phone] about prescription

## 2021-01-09 NOTE — Telephone Encounter (Signed)
Please refer to previous telephone note.

## 2021-01-10 ENCOUNTER — Other Ambulatory Visit: Payer: Self-pay

## 2021-01-10 MED ORDER — ORPHENADRINE CITRATE ER 100 MG PO TB12: 100.0000 mg | ORAL_TABLET | Freq: Two times a day (BID) | ORAL | 0 refills | Status: DC | PRN

## 2021-01-10 NOTE — Telephone Encounter (Signed)
Rx has been sent and the pt is aware.

## 2021-01-10 NOTE — Telephone Encounter (Signed)
Okay to refill x1

## 2021-01-17 ENCOUNTER — Other Ambulatory Visit: Payer: Medicare Other

## 2021-01-22 DIAGNOSIS — L438 Other lichen planus: Secondary | ICD-10-CM | POA: Diagnosis not present

## 2021-01-29 DIAGNOSIS — M25511 Pain in right shoulder: Secondary | ICD-10-CM | POA: Diagnosis not present

## 2021-01-29 DIAGNOSIS — Z471 Aftercare following joint replacement surgery: Secondary | ICD-10-CM | POA: Diagnosis not present

## 2021-01-29 DIAGNOSIS — F17210 Nicotine dependence, cigarettes, uncomplicated: Secondary | ICD-10-CM | POA: Diagnosis not present

## 2021-01-29 DIAGNOSIS — M533 Sacrococcygeal disorders, not elsewhere classified: Secondary | ICD-10-CM | POA: Diagnosis not present

## 2021-02-05 ENCOUNTER — Encounter: Payer: Medicare Other | Admitting: Psychology

## 2021-02-05 ENCOUNTER — Encounter: Payer: Self-pay | Admitting: Psychology

## 2021-02-05 ENCOUNTER — Encounter: Payer: Medicare Other | Attending: Psychology | Admitting: Psychology

## 2021-02-05 DIAGNOSIS — R4189 Other symptoms and signs involving cognitive functions and awareness: Secondary | ICD-10-CM | POA: Insufficient documentation

## 2021-02-05 DIAGNOSIS — F4310 Post-traumatic stress disorder, unspecified: Secondary | ICD-10-CM | POA: Diagnosis not present

## 2021-02-05 DIAGNOSIS — G479 Sleep disorder, unspecified: Secondary | ICD-10-CM

## 2021-02-05 DIAGNOSIS — R29818 Other symptoms and signs involving the nervous system: Secondary | ICD-10-CM

## 2021-02-05 DIAGNOSIS — F331 Major depressive disorder, recurrent, moderate: Secondary | ICD-10-CM

## 2021-02-05 NOTE — Progress Notes (Signed)
Neuropsychology Visit  Patient:  [name]   DOB: 02-21-1961  MR Number: [id number]  Location: [facility] [place], [place] [id number] [place] [address] Dept: [phone]  Date of Service: 02/05/2021  Start: 1 PM End: 2 PM  TELEHEALTH NOTE  Due to national recommendations of social distancing due to COVID 19, an audio/video telehealth visit is felt to be most appropriate for this patient at this time.  See Chart message from today for the patient's consent to telehealth from [place].     I verified that I am speaking with the correct person using two identifiers.  Location of patient: Home  Location of provider: Office Method of communication: Epic Telemed Video/Audio Names of participants : April scheduling, myself obtaining consent and vitals if available Established patient Time spent on call: 1 hour  Duration of Service: 1 Hour  Provider/Observer:     Edgardo Roys PsyD  Chief Complaint:      Chief Complaint  Patient presents with   Pain   Depression   Memory Loss   Sleeping Problem    Reason For Service:     [name] is a 60 year old female referred by Larey Seat, MD for neuropsychological consultation due to ongoing coping and adjustment issues and cognitive changes following diagnosis and treatment for cancer which was 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  The patient has had residual cognitive changes similar to possible impacts of chemotherapy as well as significant residual pain symptoms.  The patient was diagnosed and treated for cancer 10 years ago and continues to have ongoing follow-up regarding possible recurrence.  The patient has had neuropathy, insomnia and memory loss post chemotherapy.  The patient has depressive symptoms that have developed and  has a hard time motivating and a hard time keeping focused on life and staying engaged.  The patient is continued struggle with issues related to coping and adjustment as well as ongoing cognitive difficulties and difficulties with completing task.  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   Treatment Interventions:  Therapeutic interventions associated with chronic pain, residual effects of chemotherapy interventions symptoms associated with PTSD, depression and severe avoidance behaviors.  Participation Level:   Active  Participation Quality:  Appropriate and Attentive      Behavioral Observation:  Well Groomed, Alert, and Appropriate.   Current Psychosocial Factors: Patient is trying to do more social interactions and ending one relationship, which has been difficult at some levels.  Patient reports that she has been sick without cold (COVID neg) and not been able to much.  Also had back pain that thought might have been PE as she has had before.  Trouble taking breath.  EMS called but it was not PE.    Content of Session:   Today, we reviewed the current symptoms and continue to work on therapeutic interventions.  Patient with significant sleep depravation and has tried what she can as far as medications and behavioral changes/sleep hygine.    Effectiveness of Interventions: The patient was very active and open during the visit we had an effective appointment.  Target Goals:   Target goals include working on building coping skills and strategies around issues  of depression and residual effects of her chemotherapy including residual cognitive deficits.  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  The patient has had residual cognitive changes similar to possible impacts of chemotherapy as well as significant residual pain symptoms.  The patient was diagnosed and treated for cancer 10 years ago and continues to have ongoing follow-up regarding possible recurrence.  The patient has had neuropathy, insomnia and memory loss post chemotherapy.  The patient has depressive symptoms that have developed and has a hard time motivating and a hard time keeping focused on life and staying engaged.   The patient describes depressive symptoms, including motivation, feelings of helplessness and hopelessness and anxiety about recurrence of her cancer.  The patient also describes ongoing chronic pain symptoms and neuropathy as well as insomnia and memory loss.  Today we worked specifically on behavioral adaptations and building coping skills around issues of her difficulty completing necessary daily activities and daily requirements as her cognitive change in cognitive impairments and impact they have had on emotional status and self-esteem issues are having a very significant deleterious effect on her.  The patient is continued with symptoms and continues to be unable to work as her cognitive, stress, pain symptoms all leave her unable to perform full-time gainful employment.  The patient has a long history of significant avoidance behaviors particular on completing complex paperwork.  This is part of her overall condition and is not simply the patient not completing aspects that she should.  The patient's avoidance is associated with anxiety and is part of her debilitating symptoms.  The patient has a number of symptoms that have an overlap in their symptom spectrum.  These include residual PTSD-like symptoms, depression, significant pain  and possible complex regional pain syndrome symptoms etc.  Reviewing her overall symptoms and status I do think that she may be a very good candidate for ketamine infusion in the future.  I have asked the patient to speak with her oncologist as these types of treatments are being done through oncology in the [facility] network.  If it is not something that she could get to the [facility] network they are also providing the services through [facility] at the [employer] as well.  This may take some time as getting insurance coverage may require active efforts but we will continue to look at this possibility going forward.  Continued working on on coping with memory issues, depression and PTSD.       Diagnosis:   Neurocognitive deficits  Major depressive disorder, recurrent episode, moderate (HCC)  Post traumatic stress disorder (PTSD)  Sleep disturbance    Ilean Skill, Psy.D. Clinical Psychologist Neuropsychologist

## 2021-02-06 ENCOUNTER — Other Ambulatory Visit: Payer: Self-pay | Admitting: Internal Medicine

## 2021-02-06 ENCOUNTER — Inpatient Hospital Stay: Admission: RE | Admit: 2021-02-06 | Payer: Medicare Other | Source: Ambulatory Visit

## 2021-02-07 ENCOUNTER — Inpatient Hospital Stay: Payer: Medicare Other

## 2021-02-07 ENCOUNTER — Inpatient Hospital Stay: Payer: Medicare Other | Attending: Internal Medicine

## 2021-02-07 ENCOUNTER — Other Ambulatory Visit: Payer: Self-pay

## 2021-02-07 ENCOUNTER — Encounter: Payer: Self-pay | Admitting: Hematology & Oncology

## 2021-02-07 ENCOUNTER — Inpatient Hospital Stay: Payer: Medicare Other | Admitting: Hematology & Oncology

## 2021-02-07 VITALS — BP 132/91 | HR 92 | Temp 97.9°F | Resp 18 | Wt 214.0 lb

## 2021-02-07 DIAGNOSIS — Z1502 Genetic susceptibility to malignant neoplasm of ovary: Secondary | ICD-10-CM | POA: Diagnosis not present

## 2021-02-07 DIAGNOSIS — Z8589 Personal history of malignant neoplasm of other organs and systems: Secondary | ICD-10-CM | POA: Insufficient documentation

## 2021-02-07 DIAGNOSIS — C801 Malignant (primary) neoplasm, unspecified: Secondary | ICD-10-CM

## 2021-02-07 DIAGNOSIS — N644 Mastodynia: Secondary | ICD-10-CM | POA: Diagnosis not present

## 2021-02-07 DIAGNOSIS — G629 Polyneuropathy, unspecified: Secondary | ICD-10-CM | POA: Insufficient documentation

## 2021-02-07 DIAGNOSIS — D500 Iron deficiency anemia secondary to blood loss (chronic): Secondary | ICD-10-CM

## 2021-02-07 DIAGNOSIS — T451X5A Adverse effect of antineoplastic and immunosuppressive drugs, initial encounter: Secondary | ICD-10-CM | POA: Insufficient documentation

## 2021-02-07 DIAGNOSIS — G620 Drug-induced polyneuropathy: Secondary | ICD-10-CM | POA: Diagnosis not present

## 2021-02-07 DIAGNOSIS — Z1501 Genetic susceptibility to malignant neoplasm of breast: Secondary | ICD-10-CM | POA: Insufficient documentation

## 2021-02-07 DIAGNOSIS — M2550 Pain in unspecified joint: Secondary | ICD-10-CM | POA: Insufficient documentation

## 2021-02-07 DIAGNOSIS — Z86718 Personal history of other venous thrombosis and embolism: Secondary | ICD-10-CM | POA: Diagnosis not present

## 2021-02-07 DIAGNOSIS — Z923 Personal history of irradiation: Secondary | ICD-10-CM | POA: Insufficient documentation

## 2021-02-07 DIAGNOSIS — Z9884 Bariatric surgery status: Secondary | ICD-10-CM

## 2021-02-07 DIAGNOSIS — Z95828 Presence of other vascular implants and grafts: Secondary | ICD-10-CM

## 2021-02-07 DIAGNOSIS — Z7982 Long term (current) use of aspirin: Secondary | ICD-10-CM | POA: Diagnosis not present

## 2021-02-07 DIAGNOSIS — R109 Unspecified abdominal pain: Secondary | ICD-10-CM | POA: Insufficient documentation

## 2021-02-07 DIAGNOSIS — D509 Iron deficiency anemia, unspecified: Secondary | ICD-10-CM | POA: Insufficient documentation

## 2021-02-07 LAB — CBC WITH DIFFERENTIAL (CANCER CENTER ONLY)
Abs Immature Granulocytes: 0.11 10*3/uL — ABNORMAL HIGH (ref 0.00–0.07)
Basophils Absolute: 0 10*3/uL (ref 0.0–0.1)
Basophils Relative: 0 %
Eosinophils Absolute: 0.1 10*3/uL (ref 0.0–0.5)
Eosinophils Relative: 2 %
HCT: 44 % (ref 36.0–46.0)
Hemoglobin: 14.7 g/dL (ref 12.0–15.0)
Immature Granulocytes: 1 %
Lymphocytes Relative: 22 %
Lymphs Abs: 1.8 10*3/uL (ref 0.7–4.0)
MCH: 28.9 pg (ref 26.0–34.0)
MCHC: 33.4 g/dL (ref 30.0–36.0)
MCV: 86.6 fL (ref 80.0–100.0)
Monocytes Absolute: 0.6 10*3/uL (ref 0.1–1.0)
Monocytes Relative: 7 %
Neutro Abs: 5.5 10*3/uL (ref 1.7–7.7)
Neutrophils Relative %: 68 %
Platelet Count: 256 10*3/uL (ref 150–400)
RBC: 5.08 MIL/uL (ref 3.87–5.11)
RDW: 14.6 % (ref 11.5–15.5)
WBC Count: 8.2 10*3/uL (ref 4.0–10.5)
nRBC: 0 % (ref 0.0–0.2)

## 2021-02-07 LAB — CMP (CANCER CENTER ONLY)
ALT: 23 U/L (ref 0–44)
AST: 24 U/L (ref 15–41)
Albumin: 4.1 g/dL (ref 3.5–5.0)
Alkaline Phosphatase: 99 U/L (ref 38–126)
Anion gap: 9 (ref 5–15)
BUN: 21 mg/dL — ABNORMAL HIGH (ref 6–20)
CO2: 29 mmol/L (ref 22–32)
Calcium: 9.6 mg/dL (ref 8.9–10.3)
Chloride: 103 mmol/L (ref 98–111)
Creatinine: 0.97 mg/dL (ref 0.44–1.00)
GFR, Estimated: >60 mL/min (ref >60)
Glucose, Bld: 76 mg/dL (ref 70–99)
Potassium: 3.3 mmol/L — ABNORMAL LOW (ref 3.5–5.1)
Sodium: 141 mmol/L (ref 135–145)
Total Bilirubin: 0.3 mg/dL (ref 0.3–1.2)
Total Protein: 6.8 g/dL (ref 6.5–8.1)

## 2021-02-07 LAB — RETICULOCYTES
Immature Retic Fract: 15.5 % (ref 2.3–15.9)
RBC.: 5.1 MIL/uL (ref 3.87–5.11)
Retic Count, Absolute: 96.9 10*3/uL (ref 19.0–186.0)
Retic Ct Pct: 1.9 % (ref 0.4–3.1)

## 2021-02-07 LAB — LIPID PANEL
Cholesterol: 139 mg/dL (ref 0–200)
HDL: 42 mg/dL (ref >40)
LDL Cholesterol: 63 mg/dL (ref 0–99)
Total CHOL/HDL Ratio: 3.3 RATIO
Triglycerides: 172 mg/dL — ABNORMAL HIGH (ref <150)
VLDL: 34 mg/dL (ref 0–40)

## 2021-02-07 MED ORDER — SODIUM CHLORIDE 0.9% FLUSH
10.0000 mL | Freq: Once | INTRAVENOUS | Status: AC
  Administered 2021-02-07: 10 mL via INTRAVENOUS

## 2021-02-07 MED ORDER — HEPARIN SOD (PORK) LOCK FLUSH 100 UNIT/ML IV SOLN
500.0000 [IU] | Freq: Once | INTRAVENOUS | Status: AC
  Administered 2021-02-07: 500 [IU] via INTRAVENOUS

## 2021-02-07 NOTE — Patient Instructions (Signed)
Implanted [facility] Guide An implanted port is a device that is placed under the skin. It is usually placed in the chest. The device can be used to give IV medicine, to take blood, or for dialysis. You may have an implanted port if: You need IV medicine that would be irritating to the small veins in your hands or arms. You need IV medicines, such as antibiotics, for a long period of time. You need IV nutrition for a long period of time. You need dialysis. When you have a port, your health care provider can choose to use the port instead of veins in your arms for these procedures. You may have fewer limitations when using a port than you would if you used other types of long-term IVs, and you will likely be able to return to normal activities after your incision heals. An implanted port has two main parts: Reservoir. The reservoir is the part where a needle is inserted to give medicines or draw blood. The reservoir is round. After it is placed, it appears as a small, raised area under your skin. Catheter. The catheter is a thin, flexible tube that connects the reservoir to a vein. Medicine that is inserted into the reservoir goes into the catheter and then into the vein. How is my port accessed? To access your port: A numbing cream may be placed on the skin over the port site. Your health care provider will put on a mask and sterile gloves. The skin over your port will be cleaned carefully with a germ-killing soap and allowed to dry. Your health care provider will gently pinch the port and insert a needle into it. Your health care provider will check for a blood return to make sure the port is in the vein and is not clogged. If your port needs to remain accessed to get medicine continuously (constant infusion), your health care provider will place a clear bandage (dressing) over the needle site. The dressing and needle will need to be changed every week, or as told by your health care provider. What  is flushing? Flushing helps keep the port from getting clogged. Follow instructions from your health care provider about how and when to flush the port. Ports are usually flushed with saline solution or a medicine called heparin. The need for flushing will depend on how the port is used: If the port is only used from time to time to give medicines or draw blood, the port may need to be flushed: Before and after medicines have been given. Before and after blood has been drawn. As part of routine maintenance. Flushing may be recommended every 4-6 weeks. If a constant infusion is running, the port may not need to be flushed. Throw away any syringes in a disposal container that is meant for sharp items (sharps container). You can buy a sharps container from a pharmacy, or you can make one by using an empty hard plastic bottle with a cover. How long will my port stay implanted? The port can stay in for as long as your health care provider thinks it is needed. When it is time for the port to come out, a surgery will be done to remove it. The surgery will be similar to the procedure that was done to put the port in. Follow these instructions at home:  Flush your port as told by your health care provider. If you need an infusion over several days, follow instructions from your health care provider about how  to take care of your port site. Make sure you: Wash your hands with soap and water before you change your dressing. If soap and water are not available, use alcohol-based hand sanitizer. Change your dressing as told by your health care provider. Place any used dressings or infusion bags into a plastic bag. Throw that bag in the trash. Keep the dressing that covers the needle clean and dry. Do not get it wet. Do not use scissors or sharp objects near the tube. Keep the tube clamped, unless it is being used. Check your port site every day for signs of infection. Check for: Redness, swelling, or  pain. Fluid or blood. Pus or a bad smell. Protect the skin around the port site. Avoid wearing bra straps that rub or irritate the site. Protect the skin around your port from seat belts. Place a soft pad over your chest if needed. Bathe or shower as told by your health care provider. The site may get wet as long as you are not actively receiving an infusion. Return to your normal activities as told by your health care provider. Ask your health care provider what activities are safe for you. Carry a medical alert card or wear a medical alert bracelet at all times. This will let health care providers know that you have an implanted port in case of an emergency. Get help right away if: You have redness, swelling, or pain at the port site. You have fluid or blood coming from your port site. You have pus or a bad smell coming from the port site. You have a fever. Summary Implanted ports are usually placed in the chest for long-term IV access. Follow instructions from your health care provider about flushing the port and changing bandages (dressings). Take care of the area around your port by avoiding clothing that puts pressure on the area, and by watching for signs of infection. Protect the skin around your port from seat belts. Place a soft pad over your chest if needed. Get help right away if you have a fever or you have redness, swelling, pain, drainage, or a bad smell at the port site. This information is not intended to replace advice given to you by your health care provider. Make sure you discuss any questions you have with your health care provider. Document Revised: 07/19/2020 Document Reviewed: 09/13/2019 Elsevier Patient Education  [place].

## 2021-02-07 NOTE — Progress Notes (Signed)
Hematology and Oncology Follow Up Visit  [name] [id number] May 19, 1960 60 y.o. 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 is here today for follow-up.  She seems be doing pretty well.  The big news is that she is going be going to [place] and [place] in January.  She will be gone for about 3 weeks.  She has been been complaining of some discomfort with the right breast.  She is post to be having a diagnostic mammogram for this.  Unfortunately, she is going to have rotator cuff surgery.  This sounds like the right shoulder is damaged.  She says she is going to have an MRI for this next week.  She still has a neuropathy.  She still has back discomfort.  This is all pretty stable.  There is no change in bowel or bladder habits.  She has avoided the coronavirus.  Is been no bleeding.  She has had no issues with respect to her weight.  She is losing some weight which she is happy about.  Overall, I would have to say that her performance status is probably ECOG 1.     Medications:  Allergies as of 02/07/2021       Reactions   Codeine Nausea Only   Orange Itching, Rash        Medication List        Accurate as of February 07, 2021  3:42 PM. If you have any questions, ask your nurse or doctor.          amphetamine-dextroamphetamine 20 MG 24 hr capsule Commonly known as: Adderall XR Take 1 capsule (20 mg total) by mouth daily.   aspirin 81 MG tablet Take 81 mg by mouth daily. 2 TABS IN AM   butalbital-acetaminophen-caffeine 50-325-40 MG tablet Commonly known as: FIORICET Take 1 tablet by mouth every 6 (six) hours as needed for headache (migraine abortion). TAKE 1 TABLET BY MOUTH EVERY 6 HOURS AS NEEDED FOR HEADACHE further refills should come from  Neurology team .   calcium carbonate 500 MG chewable tablet Commonly known as: TUMS - dosed in mg elemental calcium Chew 1 tablet by mouth daily as needed for indigestion or heartburn.   diclofenac 75 MG EC tablet Commonly known as: VOLTAREN TK 1 T PO BID AFTER MEALS FOR INFLAMMATION / PAIN / SWELLING PRN ONLY   donepezil 10 MG tablet Commonly known as: ARICEPT TAKE 1 TABLET(10 MG) BY MOUTH EVERY MORNING   DULoxetine 60 MG capsule Commonly known as: CYMBALTA TAKE 1 CAPSULE(60 MG) BY MOUTH DAILY   multivitamin with minerals Tabs tablet Take 1 tablet by mouth daily.   Nurtec 75 MG Tbdp Generic drug: Rimegepant Sulfate Take one orally prn migraine headache .limit one a day   orphenadrine 100 MG tablet Commonly known as: NORFLEX Take 1 tablet (100 mg total) by mouth 2 (two) times daily as needed for muscle spasms. Schedule follow up visit for more refills. [phone]   pregabalin 200 MG capsule Commonly known as: LYRICA TAKE 1 CAPSULE(200 MG) BY MOUTH TWICE DAILY   simvastatin 40 MG tablet Commonly known as: ZOCOR TAKE 1 TABLET BY MOUTH DAILY   spironolactone-hydrochlorothiazide 25-25 MG tablet Commonly known as: ALDACTAZIDE TAKE 1 TABLET BY MOUTH DAILY FOR HIGH BLOOD PRESSURE. STOP THE POTASSIUM SUPPLEMENTAND MAXIDE,.CHECK BLOOD TESTS IN 2-3 WEEKS   Vitamin D (Ergocalciferol) 1.25  MG (50000 UNIT) Caps capsule Commonly known as: DRISDOL TAKE 1 CAPSULE BY MOUTH 1 TIME A WEEK   zolpidem 5 MG tablet Commonly known as: AMBIEN TAKE 1 TO 2 TABLETS(5 TO 10 MG) BY MOUTH EVERY NIGHT AT BEDTIME AS NEEDED        Allergies:  Allergies  Allergen Reactions   Codeine Nausea Only   Orange Itching and Rash    Past Medical History, Surgical history, Social history, and Family History were reviewed and updated.  Review of Systems: Review of Systems  Constitutional: Negative.   HENT: Negative.    Eyes: Negative.   Respiratory: Negative.    Cardiovascular: Negative.    Gastrointestinal:  Positive for abdominal pain.  Genitourinary: Negative.   Musculoskeletal:  Positive for joint pain.  Skin: Negative.   Neurological:  Positive for tingling.  Endo/Heme/Allergies: Negative.   Psychiatric/Behavioral: Negative.       Physical Exam:  vitals were not taken for this visit.   Wt Readings from Last 3 Encounters:  11/07/20 216 lb (98 kg)  10/31/20 214 lb 12.8 oz (97.4 kg)  06/21/20 204 lb (92.5 kg)  Her vital signs show temperature of 97.9.  Pulse 92.  Blood pressure 132/91.  Weight is 214 pounds.  Physical Exam Vitals reviewed.  Constitutional:      Comments: Her breast exam shows bilateral breast reductions.  I really cannot palpate any thing unusual with respect to the right breast.  There is no distinct mass.  There is no axillary adenopathy.  HENT:     Head: Normocephalic and atraumatic.  Eyes:     Pupils: Pupils are equal, round, and reactive to light.  Cardiovascular:     Rate and Rhythm: Normal rate and regular rhythm.     Heart sounds: Normal heart sounds.  Pulmonary:     Effort: Pulmonary effort is normal.     Breath sounds: Normal breath sounds.  Abdominal:     General: Bowel sounds are normal.     Palpations: Abdomen is soft.  Musculoskeletal:        General: No tenderness or deformity. Normal range of motion.     Cervical back: Normal range of motion.     Comments: Extremities shows decreased range of motion of the right shoulder.  She has decreased abduction and external rotation.  Lymphadenopathy:     Cervical: No cervical adenopathy.  Skin:    General: Skin is warm and dry.     Findings: No erythema or rash.  Neurological:     Mental Status: She is alert and oriented to person, place, and time.  Psychiatric:        Behavior: Behavior normal.        Thought Content: Thought content normal.        Judgment: Judgment normal.     Lab Results  Component Value Date   WBC 8.2 02/07/2021   HGB 14.7 02/07/2021   HCT 44.0  02/07/2021   MCV 86.6 02/07/2021   PLT 256 02/07/2021   Lab Results  Component Value Date   FERRITIN 68 04/19/2020   IRON 84 04/19/2020   TIBC 301 04/19/2020   UIBC 217 04/19/2020   IRONPCTSAT 28 04/19/2020   Lab Results  Component Value Date   RETICCTPCT 1.9 02/07/2021   RBC 5.10 02/07/2021   No results found for: KPAFRELGTCHN, LAMBDASER, KAPLAMBRATIO No results found for: IGGSERUM, IGA, IGMSERUM No results found for: TOTALPROTELP, ALBUMINELP, A1GS, A2GS, BETS, BETA2SER, [place], [place], SPEI   Chemistry  Component Value Date/Time   NA 141 02/07/2021 1427   NA 147 (H) 04/16/2017 1152   NA 139 12/27/2015 1156   K 3.3 (L) 02/07/2021 1427   K 3.4 04/16/2017 1152   K 3.9 12/27/2015 1156   CL 103 02/07/2021 1427   CL 101 04/16/2017 1152   CO2 29 02/07/2021 1427   CO2 30 04/16/2017 1152   CO2 27 12/27/2015 1156   BUN 21 (H) 02/07/2021 1427   BUN 18 04/16/2017 1152   BUN 14.9 12/27/2015 1156   CREATININE 0.97 02/07/2021 1427   CREATININE 1.07 (H) 03/01/2020 1626   CREATININE 0.9 12/27/2015 1156      Component Value Date/Time   CALCIUM 9.6 02/07/2021 1427   CALCIUM 9.8 04/16/2017 1152   CALCIUM 9.7 12/27/2015 1156   ALKPHOS 99 02/07/2021 1427   ALKPHOS 87 (H) 04/16/2017 1152   ALKPHOS 106 12/27/2015 1156   AST 24 02/07/2021 1427   AST 29 12/27/2015 1156   ALT 23 02/07/2021 1427   ALT 26 04/16/2017 1152   ALT 28 12/27/2015 1156   BILITOT 0.3 02/07/2021 1427   BILITOT 0.33 12/27/2015 1156       Impression and Plan: Ms. Luckenbaugh is very pleasant 60 yo [place] American female with a remote history of a poorly differentiated carcinoma which was felt to be ovarian, BRCA +. She completed 6 cycles of Carboplatin/Taxotere 12 years ago.   Will be interesting to see with the mammogram has to show.  I am a little bit worried about her traveling halfway around the [place].  She does have a past history of thromboembolic disease.  I really think that she would benefit from  a low-dose Xarelto on her trip.  I told her to call us in January to let us know how long she will be gone.  I will call in Xarelto at 10 mg to be taken daily to start the day before her trip and to finish of the day after she gets back from her trip to [place].  She is also taking 162 mg of aspirin a day.  I think she can continue this in addition to the Xarelto.  We will plan to get her back to see [place] in another 6 months or so.  She will have a Port-A-Cath flushed every 2 months.  Forgot to mention that she had a mammogram in April of this year.  We will get her back in 6 months.  She needs her Port-A-Cath flush every 2 months.    [name], MD 9/28/20223:42 PM

## 2021-02-08 ENCOUNTER — Telehealth: Payer: Self-pay | Admitting: *Deleted

## 2021-02-08 LAB — IRON AND TIBC
Iron: 51 ug/dL (ref 41–142)
Saturation Ratios: 15 % — ABNORMAL LOW (ref 21–57)
TIBC: 339 ug/dL (ref 236–444)
UIBC: 288 ug/dL (ref 120–384)

## 2021-02-08 LAB — FERRITIN: Ferritin: 38 ng/mL (ref 11–307)

## 2021-02-08 LAB — CA 125: Cancer Antigen (CA) 125: 5 U/mL (ref 0.0–38.1)

## 2021-02-08 NOTE — Telephone Encounter (Signed)
Called and gave upcoming appointments - confirmed - patient requested port flush to be done @ [facility]

## 2021-02-09 ENCOUNTER — Encounter: Payer: Self-pay | Admitting: Hematology & Oncology

## 2021-02-12 ENCOUNTER — Telehealth: Payer: Self-pay | Admitting: *Deleted

## 2021-02-12 ENCOUNTER — Other Ambulatory Visit: Payer: Self-pay | Admitting: Hematology & Oncology

## 2021-02-12 DIAGNOSIS — C801 Malignant (primary) neoplasm, unspecified: Secondary | ICD-10-CM

## 2021-02-12 DIAGNOSIS — F4323 Adjustment disorder with mixed anxiety and depressed mood: Secondary | ICD-10-CM

## 2021-02-12 DIAGNOSIS — G63 Polyneuropathy in diseases classified elsewhere: Secondary | ICD-10-CM

## 2021-02-12 NOTE — Telephone Encounter (Signed)
Per 03/05/21 staff message Sheria Lang - called and gave upcoming appointment - (1) dose of IV Iron

## 2021-02-13 ENCOUNTER — Other Ambulatory Visit: Payer: Self-pay

## 2021-02-13 DIAGNOSIS — M533 Sacrococcygeal disorders, not elsewhere classified: Secondary | ICD-10-CM | POA: Diagnosis not present

## 2021-02-13 DIAGNOSIS — G63 Polyneuropathy in diseases classified elsewhere: Secondary | ICD-10-CM

## 2021-02-13 DIAGNOSIS — C801 Malignant (primary) neoplasm, unspecified: Secondary | ICD-10-CM

## 2021-02-13 DIAGNOSIS — F4323 Adjustment disorder with mixed anxiety and depressed mood: Secondary | ICD-10-CM

## 2021-02-13 MED ORDER — PREGABALIN 200 MG PO CAPS: ORAL_CAPSULE | ORAL | 5 refills | Status: DC

## 2021-02-14 ENCOUNTER — Inpatient Hospital Stay: Payer: Medicare Other | Attending: Internal Medicine

## 2021-02-14 ENCOUNTER — Other Ambulatory Visit: Payer: Self-pay | Admitting: Family Medicine

## 2021-02-14 ENCOUNTER — Other Ambulatory Visit: Payer: Self-pay

## 2021-02-14 VITALS — BP 131/81 | HR 81 | Temp 97.2°F | Resp 17

## 2021-02-14 DIAGNOSIS — Z95828 Presence of other vascular implants and grafts: Secondary | ICD-10-CM

## 2021-02-14 DIAGNOSIS — D509 Iron deficiency anemia, unspecified: Secondary | ICD-10-CM | POA: Diagnosis not present

## 2021-02-14 DIAGNOSIS — D500 Iron deficiency anemia secondary to blood loss (chronic): Secondary | ICD-10-CM

## 2021-02-14 DIAGNOSIS — G63 Polyneuropathy in diseases classified elsewhere: Secondary | ICD-10-CM

## 2021-02-14 DIAGNOSIS — M25511 Pain in right shoulder: Secondary | ICD-10-CM | POA: Diagnosis not present

## 2021-02-14 DIAGNOSIS — C801 Malignant (primary) neoplasm, unspecified: Secondary | ICD-10-CM

## 2021-02-14 MED ORDER — SODIUM CHLORIDE 0.9 % IV SOLN: Freq: Once | INTRAVENOUS | Status: AC

## 2021-02-14 MED ORDER — SODIUM CHLORIDE 0.9% FLUSH
10.0000 mL | INTRAVENOUS | Status: DC | PRN
  Administered 2021-02-14: 10 mL

## 2021-02-14 MED ORDER — HEPARIN SOD (PORK) LOCK FLUSH 100 UNIT/ML IV SOLN
500.0000 [IU] | Freq: Once | INTRAVENOUS | Status: AC | PRN
  Administered 2021-02-14: 500 [IU]

## 2021-02-14 MED ORDER — SODIUM CHLORIDE 0.9 % IV SOLN
200.0000 mg | Freq: Once | INTRAVENOUS | Status: AC
  Administered 2021-02-14: 200 mg via INTRAVENOUS
  Filled 2021-02-14: qty 200

## 2021-02-14 MED ORDER — AMPHETAMINE-DEXTROAMPHET ER 20 MG PO CP24: 20.0000 mg | ORAL_CAPSULE | Freq: Every day | ORAL | 0 refills | Status: DC

## 2021-02-14 NOTE — Progress Notes (Signed)
Pt declined to stay for post infusion observation period. Pt stated she has tolerated medication multiple times prior without difficulty. Pt aware to call clinic with any questions or concerns. Pt verbalized understanding and had no further questions.  ? ?

## 2021-02-14 NOTE — Telephone Encounter (Signed)
Pt request refill amphetamine-dextroamphetamine (ADDERALL XR) 20 MG 24 hr capsule at [id number] -

## 2021-02-14 NOTE — Telephone Encounter (Signed)
I have routed this request to Dr Brett Fairy for review. The pt is due for the medication and [place] registry was verified.

## 2021-02-18 ENCOUNTER — Encounter: Payer: Self-pay | Admitting: Internal Medicine

## 2021-02-19 ENCOUNTER — Telehealth: Payer: Medicare Other | Admitting: Psychology

## 2021-02-19 DIAGNOSIS — M25511 Pain in right shoulder: Secondary | ICD-10-CM | POA: Diagnosis not present

## 2021-03-05 ENCOUNTER — Encounter: Payer: Medicare Other | Admitting: Psychology

## 2021-03-12 DIAGNOSIS — M533 Sacrococcygeal disorders, not elsewhere classified: Secondary | ICD-10-CM | POA: Diagnosis not present

## 2021-03-21 ENCOUNTER — Other Ambulatory Visit: Payer: Self-pay | Admitting: Neurology

## 2021-03-21 MED ORDER — AMPHETAMINE-DEXTROAMPHET ER 20 MG PO CP24: 20.0000 mg | ORAL_CAPSULE | Freq: Every day | ORAL | 0 refills | Status: DC

## 2021-03-21 NOTE — Telephone Encounter (Signed)
Pt requesting refill for amphetamine-dextroamphetamine (ADDERALL XR) 20 MG 24 hr capsule. Pharmacy [facility], [place] AT [address] [place]

## 2021-03-21 NOTE — Telephone Encounter (Signed)
Received refill request for Adderall XR 40m.  Last OV was on 11/07/20.  Next OV is scheduled for 05/29/21.  Last RX was written on 02/14/21 for 30 tabs.   [facility] Database has been reviewed.

## 2021-04-06 ENCOUNTER — Other Ambulatory Visit: Payer: Self-pay | Admitting: Internal Medicine

## 2021-04-09 ENCOUNTER — Other Ambulatory Visit (HOSPITAL_BASED_OUTPATIENT_CLINIC_OR_DEPARTMENT_OTHER): Payer: Self-pay

## 2021-04-09 ENCOUNTER — Ambulatory Visit: Payer: Medicare Other | Attending: Internal Medicine

## 2021-04-09 ENCOUNTER — Inpatient Hospital Stay: Payer: Medicare Other | Attending: Internal Medicine

## 2021-04-09 ENCOUNTER — Encounter: Payer: Self-pay | Admitting: Hematology & Oncology

## 2021-04-09 ENCOUNTER — Other Ambulatory Visit: Payer: Self-pay

## 2021-04-09 DIAGNOSIS — Z95828 Presence of other vascular implants and grafts: Secondary | ICD-10-CM

## 2021-04-09 DIAGNOSIS — C801 Malignant (primary) neoplasm, unspecified: Secondary | ICD-10-CM | POA: Diagnosis not present

## 2021-04-09 DIAGNOSIS — Z23 Encounter for immunization: Secondary | ICD-10-CM

## 2021-04-09 DIAGNOSIS — D500 Iron deficiency anemia secondary to blood loss (chronic): Secondary | ICD-10-CM

## 2021-04-09 DIAGNOSIS — Z452 Encounter for adjustment and management of vascular access device: Secondary | ICD-10-CM | POA: Diagnosis not present

## 2021-04-09 DIAGNOSIS — G63 Polyneuropathy in diseases classified elsewhere: Secondary | ICD-10-CM

## 2021-04-09 MED ORDER — MODERNA COVID-19 BIVAL BOOSTER 50 MCG/0.5ML IM SUSP
INTRAMUSCULAR | 0 refills | Status: DC
  Filled 2021-04-09: qty 0.5, 1d supply, fill #0

## 2021-04-09 MED ORDER — HEPARIN SOD (PORK) LOCK FLUSH 100 UNIT/ML IV SOLN
500.0000 [IU] | Freq: Once | INTRAVENOUS | Status: AC
  Administered 2021-04-09: 15:00:00 500 [IU] via INTRAVENOUS

## 2021-04-09 MED ORDER — SODIUM CHLORIDE 0.9% FLUSH
10.0000 mL | Freq: Once | INTRAVENOUS | Status: AC
  Administered 2021-04-09: 15:00:00 10 mL via INTRAVENOUS

## 2021-04-09 MED ORDER — FLUARIX QUADRIVALENT 0.5 ML IM SUSY
PREFILLED_SYRINGE | INTRAMUSCULAR | 0 refills | Status: DC
  Filled 2021-04-09: qty 0.5, 1d supply, fill #0

## 2021-04-09 NOTE — Progress Notes (Signed)
   Covid-19 Vaccination Clinic  Name:  [name]    MRN: [id number] DOB: 03/01/1961  04/09/2021  [name] was observed post Covid-19 immunization for 15 minutes without incident. She was provided with Vaccine Information Sheet and instruction to access the V-Safe system.   [name] was instructed to call 911 with any severe reactions post vaccine: Difficulty breathing  Swelling of face and throat  A fast heartbeat  A bad rash all over body  Dizziness and weakness   Immunizations Administered     Name Date Dose VIS Date Route   Moderna Covid-19 vaccine Bivalent Booster 04/09/2021  3:03 PM 0.5 mL 12/23/2020 Intramuscular   Manufacturer: Moderna   Lot: [id number]   [place]: [id number]

## 2021-04-20 ENCOUNTER — Other Ambulatory Visit: Payer: Self-pay | Admitting: Neurology

## 2021-04-20 ENCOUNTER — Other Ambulatory Visit: Payer: Self-pay | Admitting: Hematology & Oncology

## 2021-04-20 DIAGNOSIS — G63 Polyneuropathy in diseases classified elsewhere: Secondary | ICD-10-CM

## 2021-04-20 DIAGNOSIS — C801 Malignant (primary) neoplasm, unspecified: Secondary | ICD-10-CM

## 2021-04-20 DIAGNOSIS — F4323 Adjustment disorder with mixed anxiety and depressed mood: Secondary | ICD-10-CM

## 2021-04-20 NOTE — Telephone Encounter (Signed)
Refill request for Lyrica 200 mg #60 (1 BID). Last refilled 02/13/21 and next OV scheduled 08/08/20. Please advise if ok to refill, thanks!

## 2021-04-24 ENCOUNTER — Other Ambulatory Visit: Payer: Self-pay | Admitting: Neurology

## 2021-04-24 ENCOUNTER — Telehealth: Payer: Self-pay | Admitting: Internal Medicine

## 2021-04-24 MED ORDER — ORPHENADRINE CITRATE ER 100 MG PO TB12: 100.0000 mg | ORAL_TABLET | Freq: Two times a day (BID) | ORAL | 0 refills | Status: DC | PRN

## 2021-04-24 MED ORDER — AMPHETAMINE-DEXTROAMPHET ER 20 MG PO CP24: 20.0000 mg | ORAL_CAPSULE | Freq: Every day | ORAL | 0 refills | Status: DC

## 2021-04-24 NOTE — Telephone Encounter (Signed)
Patient called because she wants refill on orphenadrine (NORFLEX) 100 MG tablet sent to [facility] on [address] in [place]. Patient states this is due to pricing.     Please advise

## 2021-04-24 NOTE — Telephone Encounter (Signed)
Done

## 2021-04-24 NOTE — Telephone Encounter (Signed)
Pt requesting refill for amphetamine-dextroamphetamine (ADDERALL XR) 20 MG 24 hr capsule. Pharmacy [facility], [place] AT [address]

## 2021-05-02 ENCOUNTER — Encounter: Payer: Self-pay | Admitting: Internal Medicine

## 2021-05-02 ENCOUNTER — Other Ambulatory Visit: Payer: Self-pay | Admitting: Neurology

## 2021-05-02 ENCOUNTER — Ambulatory Visit (INDEPENDENT_AMBULATORY_CARE_PROVIDER_SITE_OTHER): Payer: Medicare Other | Admitting: Internal Medicine

## 2021-05-02 VITALS — BP 138/90 | HR 96 | Temp 98.5°F | Ht 68.0 in | Wt 216.8 lb

## 2021-05-02 DIAGNOSIS — I10 Essential (primary) hypertension: Secondary | ICD-10-CM

## 2021-05-02 DIAGNOSIS — Z79899 Other long term (current) drug therapy: Secondary | ICD-10-CM | POA: Diagnosis not present

## 2021-05-02 DIAGNOSIS — R7989 Other specified abnormal findings of blood chemistry: Secondary | ICD-10-CM

## 2021-05-02 DIAGNOSIS — E876 Hypokalemia: Secondary | ICD-10-CM | POA: Diagnosis not present

## 2021-05-02 DIAGNOSIS — G4701 Insomnia due to medical condition: Secondary | ICD-10-CM

## 2021-05-02 DIAGNOSIS — F17200 Nicotine dependence, unspecified, uncomplicated: Secondary | ICD-10-CM | POA: Diagnosis not present

## 2021-05-02 DIAGNOSIS — H53143 Visual discomfort, bilateral: Secondary | ICD-10-CM

## 2021-05-02 DIAGNOSIS — G43109 Migraine with aura, not intractable, without status migrainosus: Secondary | ICD-10-CM

## 2021-05-02 DIAGNOSIS — H8113 Benign paroxysmal vertigo, bilateral: Secondary | ICD-10-CM

## 2021-05-02 MED ORDER — SPIRONOLACTONE 25 MG PO TABS: 25.0000 mg | ORAL_TABLET | Freq: Every day | ORAL | 1 refills | Status: DC

## 2021-05-02 MED ORDER — AMLODIPINE BESYLATE 5 MG PO TABS: 5.0000 mg | ORAL_TABLET | Freq: Every day | ORAL | 1 refills | Status: DC

## 2021-05-02 NOTE — Progress Notes (Signed)
Chief Complaint  Patient presents with   Hypertension    HPI: [name] 60 y.o. come in for problem with elevated BP  Here with companion  Bp recenetly has been "Too high ".  [facility]  was 160 port flush and nurse.  No sx with this  has been on current regimen for months  aldactazide   States musc relaxant helps  her ms pain and bad whn goes off .  ROS: See pertinent positives and negatives per HPI. 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   Adjustment disorder    Allergic rhinitis    Allergy    Alopecia    Anxiety and depression    Arthritis    Blood transfusion without reported diagnosis    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   Clotting disorder ([facility])    GERD (gastroesophageal reflux disease)    HLD (hyperlipidemia)    Hx pulmonary embolism    Hypertension    Iron deficiency anemia due to chronic blood loss 11/04/2016   Metrorrhagia    Neuromuscular disorder (HCC)    Neuropathy    Obesity    Ovarian cancer ([place]) 04/2008   ChemoTherapy   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   Raynaud's syndrome    Right groin mass    Squamous cell carcinoma of skin    Ulcerative colitis (HCC)    Vision abnormalities    Vitamin D deficiency     Family History  Problem Relation Age of Onset   Pulmonary embolism Mother    Hypertension Father    Breast cancer Maternal Grandmother        diagnosed in early 3s   Hyperlipidemia Other    Breast cancer Sister 58   Colon cancer Neg Hx    Esophageal cancer Neg Hx    Rectal cancer Neg Hx    Stomach cancer Neg Hx     Social History   Socioeconomic History   Marital status: Divorced    Spouse name: Not on file   Number of children: 1   Years of education: Master's   Highest education level: Not on file  Occupational History    Employer: UNEMPLOYED     Comment: disabled  Tobacco Use   Smoking status: Every Day    Packs/day: 1.00    Years: 37.00    Pack years: 37.00    Types: Cigarettes    Start date: 06/16/1978   Smokeless tobacco: Never   Tobacco comments:    2- 7-16  STILL SMOKING  Vaping Use   Vaping Use: Never used  Substance and Sexual Activity   Alcohol use: Yes    Alcohol/week: 0.0 standard drinks    Comment: rare, 3 weekly   Drug use: No   Sexual activity: Not on file  Other Topics Concern   Not on file  Social History Narrative   Divorced   1 son /1 adopted adult son  2 dogs    Sales occupation on disability for now   Regular exercise-yes money stress   Daily caffeine use 2-3 and 2 cups of coffee   On since a security disability since 2012   Patient is right-handed.   Patient has a Master's degree.   Social Determinants of Health   Financial Resource Strain: Not  on file  Food Insecurity: Not on file  Transportation Needs: Not on file  Physical Activity: Not on file  Stress: Not on file  Social Connections: Not on file    Outpatient Medications Prior to Visit  Medication Sig Dispense Refill   amphetamine-dextroamphetamine (ADDERALL XR) 20 MG 24 hr capsule Take 1 capsule (20 mg total) by mouth daily. 30 capsule 0   aspirin 81 MG tablet Take 81 mg by mouth daily. 2 TABS IN AM     butalbital-acetaminophen-caffeine (FIORICET) 50-325-40 MG tablet Take 1 tablet by mouth every 6 (six) hours as needed for headache (migraine abortion). TAKE 1 TABLET BY MOUTH EVERY 6 HOURS AS NEEDED FOR HEADACHE further refills should come from Neurology team . 10 tablet 5   calcium carbonate (TUMS - DOSED IN MG ELEMENTAL CALCIUM) 500 MG chewable tablet Chew 1 tablet by mouth daily as needed for indigestion or heartburn.     COVID-19 mRNA bivalent vaccine, Moderna, (MODERNA COVID-19 BIVAL BOOSTER) 50 MCG/0.5ML injection Inject into the muscle. 0.5 mL 0   diclofenac (VOLTAREN) 75 MG EC tablet TK 1 T PO BID AFTER MEALS FOR INFLAMMATION /  PAIN / SWELLING PRN ONLY  1   donepezil (ARICEPT) 10 MG tablet TAKE 1 TABLET(10 MG) BY MOUTH EVERY MORNING 90 tablet 2   DULoxetine (CYMBALTA) 60 MG capsule TAKE 1 CAPSULE(60 MG) BY MOUTH DAILY 90 capsule 0   influenza vac split quadrivalent PF (FLUARIX QUADRIVALENT) 0.5 ML injection Inject into the muscle. 0.5 mL 0   Multiple Vitamin (MULTIVITAMIN WITH MINERALS) TABS tablet Take 1 tablet by mouth daily.     orphenadrine (NORFLEX) 100 MG tablet Take 1 tablet (100 mg total) by mouth 2 (two) times daily as needed for muscle spasms. Schedule follow up visit for more refills. [phone] 60 tablet 0   pregabalin (LYRICA) 200 MG capsule TAKE 1 CAPSULE(200 MG) BY MOUTH TWICE DAILY 60 capsule 0   Rimegepant Sulfate (NURTEC) 75 MG TBDP Take one orally prn migraine headache .limit one a day 15 tablet 1   simvastatin (ZOCOR) 40 MG tablet TAKE 1 TABLET BY MOUTH DAILY 90 tablet 0   Vitamin D, Ergocalciferol, (DRISDOL) 1.25 MG (50000 UNIT) CAPS capsule TAKE 1 CAPSULE BY MOUTH 1 TIME A WEEK 13 capsule 2   zolpidem (AMBIEN) 5 MG tablet TAKE 1 TO 2 TABLETS(5 TO 10 MG) BY MOUTH EVERY NIGHT AT BEDTIME AS NEEDED 180 tablet 1   spironolactone-hydrochlorothiazide (ALDACTAZIDE) 25-25 MG tablet TAKE 1 TABLET BY MOUTH DAILY FOR HIGH BLOOD PRESSURE. STOP THE POTASSIUM SUPPLEMENTAND MAXIDE,.CHECK BLOOD TESTS IN 2-3 WEEKS 90 tablet 0   Facility-Administered Medications Prior to Visit  Medication Dose Route Frequency Provider Last Rate Last Admin   0.9 %  sodium chloride infusion  500 mL Intravenous Once Gatha Mayer, MD         EXAM:  BP 138/90 (BP Location: Right Arm, Patient Position: Sitting, Cuff Size: Normal)    Pulse 96    Temp 98.5 F (36.9 C) (Oral)    Ht _0  (1.727 m)    Wt 216 lb 12.8 oz (98.3 kg)    SpO2 99%    BMI 32.96 kg/m   Body mass index is 32.96 kg/m.  GENERAL: vitals reviewed and listed above, alert, oriented, appears well hydrated and in no acute distress HEENT: atraumatic, conjunctiva   clear, no obvious abnormalities on inspection of external nose and ears OP : no masked  NECK: no obvious masses on inspection palpation  LUNGS:  clear to auscultation bilaterally, no wheezes, rales or rhonchi, good air movement Abdomen:  Sof,t normal bowel sounds without hepatosplenomegaly, no guarding rebound or masses no CVA tenderness CV: HRRR, no clubbing cyanosis or  peripheral edema  MS: moves all extremities without noticeable focal  abnormality PSYCH: pleasant and cooperative, no obvious depression or anxiety Lab Results  Component Value Date   WBC 8.2 02/07/2021   HGB 14.7 02/07/2021   HCT 44.0 02/07/2021   PLT 256 02/07/2021   GLUCOSE 76 02/07/2021   CHOL 139 02/07/2021   TRIG 172 (H) 02/07/2021   HDL 42 02/07/2021   LDLDIRECT 98.7 09/04/2009   LDLCALC 63 02/07/2021   ALT 23 02/07/2021   AST 24 02/07/2021   NA 141 02/07/2021   K 3.3 (L) 02/07/2021   CL 103 02/07/2021   CREATININE 0.97 02/07/2021   BUN 21 (H) 02/07/2021   CO2 29 02/07/2021   TSH 3.90 03/01/2020   INR 0.86 06/02/2013   HGBA1C 6.2 02/09/2019   BP Readings from Last 3 Encounters:  05/02/21 138/90  04/09/21 (!) 136/108  02/14/21 131/81    ASSESSMENT AND PLAN:  Discussed the following assessment and plan:  Essential hypertension - Plan: Basic metabolic panel, Magnesium  Hypokalemia - aldactazide   in sept  will change to spironolactone and check lab in 3-4 weeks  - Plan: Basic metabolic panel, Magnesium  Medication management  Abnormal thyroid stimulating hormone (TSH) level - Plan: TSH, T4, free  TOBACCO USE Bp no optimally control and  potassium still low side    Will change to aldactone and amlodipine  She states that musc relaxant works the best for her ms pain  and would like to remain on despite not being paid for.  -Patient advised to return or notify health care team  if  new concerns arise.  Patient Instructions  Stop the  aldactazide  and change to spironolactone and  amlodipine   for BP   Decrease sodium  in diet .   Plan lab in about a month  and bp   visit virtual or  in person   Advise BP monitor .  Standley Brooking. Shaka Zech M.D.

## 2021-05-02 NOTE — Patient Instructions (Addendum)
Stop the  aldactazide  and change to spironolactone and  amlodipine  for BP   Decrease sodium  in diet .   Plan lab in about a month  and bp   visit virtual or  in person   Advise BP monitor .

## 2021-05-04 ENCOUNTER — Encounter: Payer: Self-pay | Admitting: Internal Medicine

## 2021-05-07 ENCOUNTER — Other Ambulatory Visit: Payer: Self-pay | Admitting: Internal Medicine

## 2021-05-07 ENCOUNTER — Other Ambulatory Visit: Payer: Self-pay | Admitting: Hematology & Oncology

## 2021-05-07 DIAGNOSIS — G63 Polyneuropathy in diseases classified elsewhere: Secondary | ICD-10-CM

## 2021-05-08 ENCOUNTER — Encounter: Payer: Self-pay | Admitting: Hematology & Oncology

## 2021-05-10 ENCOUNTER — Ambulatory Visit (INDEPENDENT_AMBULATORY_CARE_PROVIDER_SITE_OTHER): Payer: Medicare Other

## 2021-05-10 VITALS — Ht 68.0 in | Wt 216.0 lb

## 2021-05-10 DIAGNOSIS — Z0000 Encounter for general adult medical examination without abnormal findings: Secondary | ICD-10-CM | POA: Diagnosis not present

## 2021-05-10 NOTE — Patient Instructions (Addendum)
[name] , Thank you for taking time to come for your Medicare Wellness Visit. I appreciate your ongoing commitment to your health goals. Please review the following plan we discussed and let me know if I can assist you in the future.   These are the goals we discussed:  Goals      Chronic Care Management     Weight (lb) < 200 lb (90.7 kg)     Patient plans to change diet and increase exercise activity.        This is a list of the screening recommended for you and due dates:  Health Maintenance  Topic Date Due   Zoster (Shingles) Vaccine (1 of 2) 10/31/2021*   Pneumococcal Vaccination (2 - PPSV23 if available, else PCV20) 05/10/2022*   Flu Shot  03/06/2024*   Mammogram  08/17/2021   Tetanus Vaccine  01/15/2030   Colon Cancer Screening  03/20/2030   COVID-19 Vaccine  Completed   Hepatitis C Screening: USPSTF Recommendation to screen - Ages 18-79 yo.  Completed   HIV Screening  Completed   HPV Vaccine  Aged Out  *Topic was postponed. The date shown is not the original due date.    Advanced directives: Yes  Conditions/risks identified: None  Next appointment: Follow up in one year for your annual wellness visit    Preventive Care 65 Years and Older, Female Preventive care refers to lifestyle choices and visits with your health care provider that can promote health and wellness. What does preventive care include? A yearly physical exam. This is also called an annual well check. Dental exams once or twice a year. Routine eye exams. Ask your health care provider how often you should have your eyes checked. Personal lifestyle choices, including: Daily care of your teeth and gums. Regular physical activity. Eating a healthy diet. Avoiding tobacco and drug use. Limiting alcohol use. Practicing safe sex. Taking low-dose aspirin every day. Taking vitamin and mineral supplements as recommended by your health care provider. What happens during an annual well check? The  services and screenings done by your health care provider during your annual well check will depend on your age, overall health, lifestyle risk factors, and family history of disease. Counseling  Your health care provider may ask you questions about your: Alcohol use. Tobacco use. Drug use. Emotional well-being. Home and relationship well-being. Sexual activity. Eating habits. History of falls. Memory and ability to understand (cognition). Work and work Statistician. Reproductive health. Screening  You may have the following tests or measurements: Height, weight, and BMI. Blood pressure. Lipid and cholesterol levels. These may be checked every 5 years, or more frequently if you are over 33 years old. Skin check. Lung cancer screening. You may have this screening every year starting at age 84 if you have a 30-pack-year history of smoking and currently smoke or have quit within the past 15 years. Fecal occult blood test (FOBT) of the stool. You may have this test every year starting at age 80. Flexible sigmoidoscopy or colonoscopy. You may have a sigmoidoscopy every 5 years or a colonoscopy every 10 years starting at age 20. Hepatitis C blood test. Hepatitis B blood test. Sexually transmitted disease (STD) testing. Diabetes screening. This is done by checking your blood sugar (glucose) after you have not eaten for a while (fasting). You may have this done every 1-3 years. Bone density scan. This is done to screen for osteoporosis. You may have this done starting at age 75. Mammogram. This may be  done every 1-2 years. Talk to your health care provider about how often you should have regular mammograms. Talk with your health care provider about your test results, treatment options, and if necessary, the need for more tests. Vaccines  Your health care provider may recommend certain vaccines, such as: Influenza vaccine. This is recommended every year. Tetanus, diphtheria, and acellular  pertussis (Tdap, Td) vaccine. You may need a Td booster every 10 years. Zoster vaccine. You may need this after age 97. Pneumococcal 13-valent conjugate (PCV13) vaccine. One dose is recommended after age 72. Pneumococcal polysaccharide (PPSV23) vaccine. One dose is recommended after age 42. Talk to your health care provider about which screenings and vaccines you need and how often you need them. This information is not intended to replace advice given to you by your health care provider. Make sure you discuss any questions you have with your health care provider. Document Released: 05/26/2015 Document Revised: 01/17/2016 Document Reviewed: 02/28/2015 Elsevier Interactive Patient Education  2017 [place] Prevention in the Home Falls can cause injuries. They can happen to people of all ages. There are many things you can do to make your home safe and to help prevent falls. What can I do on the outside of my home? Regularly fix the edges of walkways and driveways and fix any cracks. Remove anything that might make you trip as you walk through a door, such as a raised step or threshold. Trim any bushes or trees on the path to your home. Use bright outdoor lighting. Clear any walking paths of anything that might make someone trip, such as rocks or tools. Regularly check to see if handrails are loose or broken. Make sure that both sides of any steps have handrails. Any raised decks and porches should have guardrails on the edges. Have any leaves, snow, or ice cleared regularly. Use sand or salt on walking paths during winter. Clean up any spills in your garage right away. This includes oil or grease spills. What can I do in the bathroom? Use night lights. Install grab bars by the toilet and in the tub and shower. Do not use towel bars as grab bars. Use non-skid mats or decals in the tub or shower. If you need to sit down in the shower, use a plastic, non-slip stool. Keep the floor  dry. Clean up any water that spills on the floor as soon as it happens. Remove soap buildup in the tub or shower regularly. Attach bath mats securely with double-sided non-slip rug tape. Do not have throw rugs and other things on the floor that can make you trip. What can I do in the bedroom? Use night lights. Make sure that you have a light by your bed that is easy to reach. Do not use any sheets or blankets that are too big for your bed. They should not hang down onto the floor. Have a firm chair that has side arms. You can use this for support while you get dressed. Do not have throw rugs and other things on the floor that can make you trip. What can I do in the kitchen? Clean up any spills right away. Avoid walking on wet floors. Keep items that you use a lot in easy-to-reach places. If you need to reach something above you, use a strong step stool that has a grab bar. Keep electrical cords out of the way. Do not use floor polish or wax that makes floors slippery. If you must use wax,  use non-skid floor wax. Do not have throw rugs and other things on the floor that can make you trip. What can I do with my stairs? Do not leave any items on the stairs. Make sure that there are handrails on both sides of the stairs and use them. Fix handrails that are broken or loose. Make sure that handrails are as long as the stairways. Check any carpeting to make sure that it is firmly attached to the stairs. Fix any carpet that is loose or worn. Avoid having throw rugs at the top or bottom of the stairs. If you do have throw rugs, attach them to the floor with carpet tape. Make sure that you have a light switch at the top of the stairs and the bottom of the stairs. If you do not have them, ask someone to add them for you. What else can I do to help prevent falls? Wear shoes that: Do not have high heels. Have rubber bottoms. Are comfortable and fit you well. Are closed at the toe. Do not wear  sandals. If you use a stepladder: Make sure that it is fully opened. Do not climb a closed stepladder. Make sure that both sides of the stepladder are locked into place. Ask someone to hold it for you, if possible. Clearly mark and make sure that you can see: Any grab bars or handrails. First and last steps. Where the edge of each step is. Use tools that help you move around (mobility aids) if they are needed. These include: Canes. Walkers. Scooters. Crutches. Turn on the lights when you go into a dark area. Replace any light bulbs as soon as they burn out. Set up your furniture so you have a clear path. Avoid moving your furniture around. If any of your floors are uneven, fix them. If there are any pets around you, be aware of where they are. Review your medicines with your doctor. Some medicines can make you feel dizzy. This can increase your chance of falling. Ask your doctor what other things that you can do to help prevent falls. This information is not intended to replace advice given to you by your health care provider. Make sure you discuss any questions you have with your health care provider. Document Released: 02/23/2009 Document Revised: 10/05/2015 Document Reviewed: 06/03/2014 Elsevier Interactive Patient Education  2017 [employer].

## 2021-05-10 NOTE — Progress Notes (Signed)
Subjective:   [name] is a 60 y.o. female who presents for Medicare Annual (Subsequent) preventive examination.  Review of Systems    No ROS     Objective:    There were no vitals filed for this visit. There is no height or weight on file to calculate BMI.  Advanced Directives 04/09/2021 02/07/2021 04/19/2020 02/25/2019 04/15/2018 02/18/2018 10/20/2017  Does Patient Have a Medical Advance Directive? No No Yes Yes Yes Yes Yes  Type of Advance Directive - Public librarian;Living will [place];Living will [place];Living will [place];Living will [place];Living will  Does patient want to make changes to medical advance directive? No - Patient declined - No - Patient declined - - - No - Patient declined  Copy of [place] in Chart? - - - No - copy requested No - copy requested - No - copy requested  Would patient like information on creating a medical advance directive? - No - Patient declined - - - - -    Current Medications (verified) Outpatient Encounter Medications as of 05/10/2021  Medication Sig   amLODipine (NORVASC) 5 MG tablet Take 1 tablet (5 mg total) by mouth daily.   amphetamine-dextroamphetamine (ADDERALL XR) 20 MG 24 hr capsule Take 1 capsule (20 mg total) by mouth daily.   aspirin 81 MG tablet Take 81 mg by mouth daily. 2 TABS IN AM   butalbital-acetaminophen-caffeine (FIORICET) 50-325-40 MG tablet Take 1 tablet by mouth every 6 (six) hours as needed for headache (migraine abortion). TAKE 1 TABLET BY MOUTH EVERY 6 HOURS AS NEEDED FOR HEADACHE further refills should come from Neurology team .   calcium carbonate (TUMS - DOSED IN MG ELEMENTAL CALCIUM) 500 MG chewable tablet Chew 1 tablet by mouth daily as needed for indigestion or heartburn.   COVID-19 mRNA bivalent vaccine, Moderna, (MODERNA COVID-19 BIVAL BOOSTER) 50 MCG/0.5ML injection Inject into the  muscle.   diclofenac (VOLTAREN) 75 MG EC tablet TK 1 T PO BID AFTER MEALS FOR INFLAMMATION / PAIN / SWELLING PRN ONLY   donepezil (ARICEPT) 10 MG tablet TAKE 1 TABLET(10 MG) BY MOUTH EVERY MORNING   DULoxetine (CYMBALTA) 60 MG capsule TAKE 1 CAPSULE(60 MG) BY MOUTH DAILY   influenza vac split quadrivalent PF (FLUARIX QUADRIVALENT) 0.5 ML injection Inject into the muscle.   Multiple Vitamin (MULTIVITAMIN WITH MINERALS) TABS tablet Take 1 tablet by mouth daily.   orphenadrine (NORFLEX) 100 MG tablet Take 1 tablet (100 mg total) by mouth 2 (two) times daily as needed for muscle spasms. Schedule follow up visit for more refills. [phone]   pregabalin (LYRICA) 200 MG capsule TAKE 1 CAPSULE(200 MG) BY MOUTH TWICE DAILY   Rimegepant Sulfate (NURTEC) 75 MG TBDP Take one orally prn migraine headache .limit one a day   simvastatin (ZOCOR) 40 MG tablet TAKE 1 TABLET BY MOUTH DAILY   spironolactone (ALDACTONE) 25 MG tablet Take 1 tablet (25 mg total) by mouth daily.   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   Adjustment disorder    Allergic rhinitis    Allergy    Alopecia    Anxiety and depression    Arthritis    Blood transfusion without reported diagnosis    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   Clotting disorder (HCC)    GERD (gastroesophageal reflux disease)    HLD (hyperlipidemia)    Hx pulmonary embolism    Hypertension    Iron deficiency anemia due to chronic blood loss 11/04/2016   Metrorrhagia    Neuromuscular disorder (HCC)    Neuropathy     Obesity    Ovarian cancer ([place]) 04/2008   ChemoTherapy   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   Raynaud's syndrome    Right groin mass    Squamous cell carcinoma of skin    Ulcerative colitis ([place])    Vision abnormalities    Vitamin D deficiency    Past Surgical History:  Procedure Laterality Date   ABDOMINAL HYSTERECTOMY  05/2008   TAH/BSO   AUGMENTATION MAMMAPLASTY Bilateral 10+ years   BREAST BIOPSY Left 2020   BREAST BIOPSY Right    BREAST REDUCTION SURGERY  2000   CESAREAN SECTION     1991   COLONOSCOPY  2004; 12/07/10   hemorrhoids   ESOPHAGOGASTRODUODENOSCOPY     GASTRIC BYPASS  1979   REDUCTION MAMMAPLASTY Bilateral    TOENAIL EXCISION Bilateral    TONSILLECTOMY     Family History  Problem Relation Age of Onset   Pulmonary embolism Mother    Hypertension Father    Breast cancer Maternal Grandmother        diagnosed in early 88s   Hyperlipidemia Other    Breast cancer Sister 52   Colon cancer Neg Hx    Esophageal cancer Neg Hx    Rectal cancer Neg Hx    Stomach cancer Neg Hx    Social History   Socioeconomic History   Marital status: Divorced    Spouse name: Not on file   Number of children: 1   Years of education: Master's   Highest education level: Not on file  Occupational History    Employer: UNEMPLOYED    Comment: disabled  Tobacco Use   Smoking status: Every Day    Packs/day: 1.00    Years: 37.00    Pack years: 37.00    Types: Cigarettes    Start date: 06/16/1978   Smokeless tobacco: Never   Tobacco comments:    2- 7-16  STILL SMOKING  Vaping Use   Vaping Use: Never used  Substance and Sexual Activity   Alcohol use: Yes    Alcohol/week: 0.0 standard drinks    Comment: rare, 3 weekly   Drug use: No   Sexual activity: Not on file  Other Topics Concern   Not on file  Social History Narrative   Divorced   1 son /1 adopted adult son  2 dogs    Sales occupation on disability for now   Regular exercise-yes money stress    Daily caffeine use 2-3 and 2 cups of coffee   On since a security disability since 2012   Patient is right-handed.   Patient has a Master's degree.   Social Determinants of Health   Financial Resource Strain: Not on file  Food Insecurity: Not on file  Transportation Needs: Not on file  Physical Activity: Not on file  Stress: Not on file  Social Connections: Not on file    Clinical Intake:  How often do you need to have someone help you when you read instructions, pamphlets, or other written materials from your doctor or pharmacy?: (P) 2 - Rarely  Diabetic? No    Activities of Daily Living In your present state of health, do you have any difficulty performing the following activities: 05/10/2021  Hearing? N  Vision? N  Difficulty concentrating or making decisions? Y  Walking or climbing stairs? N  Dressing or bathing? N  Doing errands, shopping? N  Preparing Food and eating ? N  Using the Toilet? N  In the past six months, have you accidently leaked urine? Y  Do you have problems with loss of bowel control? N  Managing your Medications? N  Managing your Finances? Y  Housekeeping or managing your Housekeeping? Y  Some recent data might be hidden    Patient Care Team: Panosh, Standley Brooking, MD as PCP - General (Internal Medicine) Gatha Mayer, MD (Gastroenterology) Yaakov Guthrie, PsyD (Inactive) (Psychiatry) [name], MD as Attending Physician (Internal Medicine) Dohmeier, Asencion Partridge, MD (Neurology) Normajean Glasgow, MD as Attending Physician (Physical Medicine and Rehabilitation) Eli Hose, [facility] (Inactive) as Pharmacist (Pharmacist)  Indicate any recent Medical Services you may have received from other than [facility] providers in the past year (date may be approximate).     Assessment:   This is a routine wellness examination for [name].  Virtual Visit via Telephone Note  I connected with  [name] on 05/10/21 at  1:45 PM EST by telephone and  verified that I am speaking with the correct person using two identifiers.  Location: Patient: Home Provider: Office Persons participating in the virtual visit: patient/Nurse Health Advisor   I discussed the limitations, risks, security and privacy concerns of performing an evaluation and management service by telephone and the availability of in person appointments. The patient expressed understanding and agreed to proceed.  Interactive audio and video telecommunications were attempted between this nurse and patient, however failed, due to patient having technical difficulties OR patient did not have access to video capability.  We continued and completed visit with audio only.  Some vital signs may be absent or patient reported.   Criselda Peaches, LPN   Hearing/Vision screen No results found.  Dietary issues and exercise activities discussed:     Goals Addressed   None    Depression Screen PHQ 2/9 Scores 05/02/2021 06/16/2018 11/22/2015 10/12/2015 11/07/2014 10/19/2014 05/18/2014  PHQ - 2 Score 2 0 0 0 0 0 0  PHQ- 9 Score 12 - - - - - 2    Fall Risk Fall Risk  05/10/2021 05/02/2021 12/27/2015 11/22/2015 10/12/2015  Falls in the past year? 1 1 Yes Yes Yes  Number falls in past yr: 1 1 2  or more - 2 or more  Injury with Fall? 1 1 No - -  Risk Factor Category  - - High Fall Risk - -  Comment - - - - -  Risk for fall due to : - - - - -  Risk for fall due to: Comment - - - - -  Follow up - - Falls prevention discussed - -    FALL RISK PREVENTION PERTAINING TO THE HOME:  Any stairs in or around the home? No  If so, are there any without handrails? No  Home free of loose throw rugs in walkways, pet beds, electrical cords, etc? Yes  Adequate lighting in your home to reduce risk of falls? Yes   ASSISTIVE  DEVICES UTILIZED TO PREVENT FALLS:  Life alert? No  Use of a cane, walker or w/c? No  Grab bars in the bathroom? No  Shower chair or bench in shower? Yes  Elevated toilet seat or a  handicapped toilet? No   TIMED UP AND GO:  Was the test performed? No Audio Visit.   Cognitive Function:   Montreal Cognitive Assessment  02/09/2020 11/25/2018 05/26/2018 05/22/2017 06/05/2016  Visuospatial/ Executive (0/5) 5 3 4 3 4   Naming (0/3) 3 3 3 3 3   Attention: Read list of digits (0/2) 1 0 1 0 1  Attention: Read list of letters (0/1) 1 1 1 1 1   Attention: Serial 7 subtraction starting at 100 (0/3) 2 3 3 3 3   Language: Repeat phrase (0/2) 2 1 2 2 2   Language : Fluency (0/1) 1 1 1 1 1   Abstraction (0/2) 2 2 2 2 2   Delayed Recall (0/5) 3 3 3 3 2   Orientation (0/6) 5 5 5 5 6   Total 25 22 25 23 25   Adjusted Score (based on education) - - - - 25      Immunizations Immunization History  Administered Date(s) Administered   Hep A / Hep B 06/16/2018, 06/16/2018, 08/09/2018, 02/27/2019   Influenza Split 03/02/2014   Influenza Whole 03/05/2007, 02/11/2012   Influenza,inj,Quad PF,6+ Mos 03/24/2015, 01/08/2018, 02/08/2019   Influenza-Unspecified 03/10/2017   Moderna Covid-19 Vaccine Bivalent Booster 19yr & up 04/09/2021   Moderna Sars-Covid-2 Vaccination 06/25/2019, 07/23/2019, 02/04/2020   Pneumococcal Conjugate-13 11/02/2013   Td 12/12/2002   Tdap 11/02/2013, 01/16/2020   Zoster, Live 08/11/2012        Pneumococcal vaccine status: Due, Education has been provided regarding the importance of this vaccine. Advised may receive this vaccine at local pharmacy or Health Dept. Aware to provide a copy of the vaccination record if obtained from local pharmacy or Health Dept. Verbalized acceptance and understanding.    Screening Tests Health Maintenance  Topic Date Due   Pneumococcal Vaccine 127613Years old (2 - PPSV23 if available, else PCV20) 11/03/2014   Zoster Vaccines- Shingrix (1 of 2) 10/31/2021 (Originally 08/14/1979)   INFLUENZA VACCINE  03/06/2024 (Originally 12/11/2020)   MAMMOGRAM  08/17/2021   TETANUS/TDAP  01/15/2030   COLONOSCOPY (Pts 45-460yrInsurance coverage will  need to be confirmed)  03/20/2030   COVID-19 Vaccine  Completed   Hepatitis C Screening  Completed   HIV Screening  Completed   HPV VACCINES  Aged Out    Health Maintenance  Health Maintenance Due  Topic Date Due   Pneumococcal Vaccine 1931414ears old (2 - PPSV23 if available, else PCV20) 11/03/2014     Additional Screening:  Vision Screening: Recommended annual ophthalmology exams for early detection of glaucoma and other disorders of the eye. Is the patient up to date with their annual eye exam?  Yes  Who is the provider or what is the name of the office in which the patient attends annual eye exams? Followed by [facility] Dental Screening: Recommended annual dental exams for proper oral hygiene  Community Resource Referral / Chronic Care Management:  CRR required this visit?  No   CCM required this visit?  No      Plan:     I have personally reviewed and noted the following in the patients chart:   Medical and social history Use of alcohol, tobacco or illicit drugs  Current medications and supplements including opioid prescriptions. Patient currently not taking opioids Functional ability and status Nutritional status  Physical activity Advanced directives List of other physicians Hospitalizations, surgeries, and ER visits in previous 12 months Vitals Screenings to include cognitive, depression, and falls Referrals and appointments  In addition, I have reviewed and discussed with patient certain preventive protocols, quality metrics, and best practice recommendations. A written personalized care plan for preventive services as well as general preventive health recommendations were provided to patient.     Criselda Peaches, LPN   27/00/4849

## 2021-05-19 ENCOUNTER — Other Ambulatory Visit: Payer: Self-pay | Admitting: Neurology

## 2021-05-19 DIAGNOSIS — G4701 Insomnia due to medical condition: Secondary | ICD-10-CM

## 2021-05-19 DIAGNOSIS — H53143 Visual discomfort, bilateral: Secondary | ICD-10-CM

## 2021-05-19 DIAGNOSIS — H8113 Benign paroxysmal vertigo, bilateral: Secondary | ICD-10-CM

## 2021-05-19 DIAGNOSIS — G43109 Migraine with aura, not intractable, without status migrainosus: Secondary | ICD-10-CM

## 2021-05-29 ENCOUNTER — Encounter: Payer: Self-pay | Admitting: Family Medicine

## 2021-05-29 ENCOUNTER — Ambulatory Visit: Payer: Medicare Other | Admitting: Family Medicine

## 2021-05-29 VITALS — BP 131/95 | HR 96 | Ht 68.0 in | Wt 212.0 lb

## 2021-05-29 DIAGNOSIS — F988 Other specified behavioral and emotional disorders with onset usually occurring in childhood and adolescence: Secondary | ICD-10-CM | POA: Diagnosis not present

## 2021-05-29 DIAGNOSIS — G4701 Insomnia due to medical condition: Secondary | ICD-10-CM

## 2021-05-29 DIAGNOSIS — F329 Major depressive disorder, single episode, unspecified: Secondary | ICD-10-CM

## 2021-05-29 DIAGNOSIS — G43109 Migraine with aura, not intractable, without status migrainosus: Secondary | ICD-10-CM | POA: Diagnosis not present

## 2021-05-29 DIAGNOSIS — G3184 Mild cognitive impairment, so stated: Secondary | ICD-10-CM

## 2021-05-29 MED ORDER — AMPHETAMINE-DEXTROAMPHET ER 20 MG PO CP24: 20.0000 mg | ORAL_CAPSULE | Freq: Every day | ORAL | 0 refills | Status: DC

## 2021-05-29 NOTE — Progress Notes (Signed)
PATIENT: [name] DOB: 1960/08/05  REASON FOR VISIT: follow up HISTORY FROM: patient  Chief Complaint  Patient presents with   Follow-up    Pt alone, rm 16. She has been having difficulty with getting right words out. She has to stop and describe the word she is thinking of. There are times that it can be worse than others. She does note there are a lot of stressors      HISTORY OF PRESENT ILLNESS: 05/29/2021 ALL: [name] returns for follow up for migraines, ADD, memory loss and insomnia. She continues Adderall XR 26m QD, donepezil 129mQHS, Ambien 5-1057mHS and Fioricet PRN. She feels that attention deficit is much better on Adderall. She does not feel as scattered. She feels it helps with getting started with tasks that need to be completed.   Headaches seem well manage. She has 1-2 migraines on average per month. Fioricet aborts migraine.   She seems to be sleeping okay. She feels that she gets plenty of sleep, usually about 8 hours of sleep every night. She usually takes 5mg75m Ambien. Sometimes uses Delta 9 if Ambien does not work.   Neuropathy is well managed on pregabalin managed by PCP. She has good days and bad. Occasionally unsteady.   Memory is about the same. She continues to have word finding difficulty and short term memory loss. She is playing brain games on her phone and uses notes to help her remember important things. She is not exercising as much as she was. She is seeing Dr RodeSima Matas recommended consideration of ketamine infusions. She is not sure oncology will perform these treatments.   11/07/2020 ALL:  She returns for follow up for migraines, inattention, cognitive impairments and insomnia. At last visit with Dr DohmBrett Fairy2022, she added Adderall for concerns of inattention and fatigue. She has continued Adderall XR 20mg66mly. She does feel it has helped significantly with daytime fatigue. She has not taken in the past month due to misplacing her  medication. Last refill was 09/19/2020. She feels that she is sleeping better when taking Adderall as she is not sleeping during the day.   She has continued Ambien 5-10mg 8medtime for insomnia and feels it works well. She is sleeping fairly well. Migraines are well managed. She usually has 4-5 migraines days per month. This month she has only had one migraine day. Fioricet works well for abortive therapy. Nurtec not covered with her insurance.   She continues Aricept 10mg a36molerating well. Follow up neurocognitive eval with Dr RodenboBrita Romp1 was unremarkable with exception of depression. Dr Dohmeier recommended CBT at last visit. She has not seen anyone. She feels that she is doing okay. She does often cancel plans and prefers to be alone. She continues duloxetine 60mg da3mwritten by oncology following worsening depression after starting Lyrica for neuropathy (managed by PCP). Neuropathy is worse at night, not bothersome during the day when she is occupied.    02/09/2020 CD: RN notes-rm 10. presents for follow up visit. pt states that he has had a pinching sensation in back area which started 2 weeks ago and its intermittent. she has been seeing Dr RodenbouSima Matasls that has been helping. pt said she had some skin concerns for  which she is seeing specialist. She developed a dark spot in her left eye saw her ophthalmologist, a laser procedure is planned for 10/4.   CD 02-09-2020. I have the pleasure meeting again today again Ms.  [name] , a multiracial femele patient, who was last seen by our nurse practitioner Stuart Guillen.  The patient underwent neuropsychological testing and the last date of service had been 9-21 2021.  This was a follow-up with Dr. Ilean Skill whom she had seen a year earlier for baseline neuropsychological evaluation.  She has ongoing adjustment issues, cognitive changes following her treatment for cancer.  She had some residual cognitive changes that were  attributed to chemotherapy effects.  She also has a neuropathy that is likely related to either the primary malignancy or the treatment thereof.  Dr. 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  The diagnosis of neurocognitive deficits had been made a year earlier. She has been reporting ongoing hypersomnia- Epworth sleepiness score 15/ 14 points, she has better sleep quality at night. Bouts of insomnia but much less long and frequent.  She attributed this to Dr Gaspar Cola behavior therapy, improving also anxiety and depression. Depression manifesting I feeling overwhelmed, feeling paralyzed by the overwhelmed feeling. " get no- thing done ". Continues to take antidepressant medication. Lack of energy: " tired every day and then not sleeping at night "   08/05/2019 ALL: [name] is a 61 y.o. female here today for follow up. She is seeing psychology. She feels that it helps. Sleep is stable. She is trying to only take 1 tablet of Ambien versus two. She continues Aricept 43m. She feels that memory waxes and wanes. She lives alone. She is able to perform all ADL's independently. She is able to drive. She makes "wrong turns" frequently but is able to get to her destination without difficulty. She declines MOCA today stating "that is not my problem." She feels that chemo has affected her ability to stay focused. She uses written instructions for making coffee each morning. She uses sticky notes. She gets overwhelmed with paying bills so she has hired someone to help her with this. She continues Lyrica 2061mdaily and feels this is very helpful. Lyrica is prescribed by oncology. She continues to see oncology every  3-4 months.   HISTORY: (copied from Dr Dohmeier's note on 11/25/2018)  I have the pleasure of seeing [name], a [age]ear old woman of african and caucasian mixed race who reports less trouble sleeping but trouble with memory, progressive.  She has neuropathy, had migraine, is at high risk of falling. 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 She has had spells that were syncopal, and some looking like seizures.  She reports feeling isolated during the corona pandemic, has seen neither children , grandchild, nor  her parents.she is tearful , appears very depressed. She takes Cymbalta for mood.      2016 : [name] is a [age] old female with a history of neuropathy,cognitive changes and insomnia. She returns today for follow-up. The patient continues to take Ambien for insomnia. The patient states that if she does not take this medication she will not be able to go to sleep. The patient currently takes Lyrica 150 mg twice a day for neuropathy. Patient states that the burning and tingling is located in the toes extending to the mid foot bilaterally. She  also has burning and tingling in the fingers that extends to the mid hand. Denies any changes with her gait or balance. That she had a fall while standing and watching fireworks. She states that the right leg went numb and cause her to fall. She states that this is the first time this has happened. She states that the numbness resolved. He states that she is not had any additional episodes of numbness. Patient feels that her memory has remained same. She states that she is able to do simple tasks but things that require more complex thinking she has difficulty with. For example she has trouble following plots in a TV show. She states that she has directions for how to make coffee posted. She is able to complete all ADLs independently. She operates a Teacher, music without difficulty. She  states that occasionally she'll take a wrong turn to a familiar place. She continues to take Aricept 10 mg daily. Patient states that she did have a sleep study area and she is unsure if she should be wearing oxygen at bedtime. She states that she had a dental procedure and the technician noted that her oxygen saturation dropped while she was sleeping. She denies any new neurological issues. She returns today for an evaluation.  HISTORY 05/18/14 (CD): [name] is here today for her yearly revisit she is doing remarkably well knowing about her history of metastatic cancer with unknown primary to more she seems to have battled her disease very well.   She has lost some weight since her last visit,  her blood pressure is well controlled.  The HST study performed on 10-23-13 showed obstructive sleep apnea not to be present,  but some hypoxemia at night there were 109 minutes of oxygenation at 88% or less.  Since this test was a home sleep test there is a higher likelihood of artifact. The patient does  1-2 times a night  feel air hungry. She does wake occasionally up with a feeling that she doesn't  breath enough. And her heart will be racing-  palpitations have also not been seen in her home sleep test. She described sudden falls still being a concern, neuropathy contributing to this.    her cognition is improved, by her own subjective assessment as well as  MOCA test today : scored 26 out of 30 points. This is a normal range for her cognitive function.  Ambien dependent , unable to go to sleep without sleep aid for several years now.   Her list of medication was reviewed and there have been no recent changes. Her dog is her main social contact , besides her church family.    Interval history from 11/28/2015. [name] is here today and did excellent on today's memory testing. There is a variability between 26 and 22 points over the last 18 months. She mastered the trail making test, she recalled 3  out of 5 recall words. The patient had been advised in my last visit with her to avoid driving at night, she lives alone and she relies on her own transportation. She has not gotten lost. She has  an exercise regimen and she has implemented dietary changes. She is an excellent mood today and would like to reduce her Ambien from 12 point 5 at night to 10 mg which makes me very happy. I would like for her to continue with the Aricept I will follow her every 6 months.   Interval history from 06/05/2016, [name] at  the meanwhile 61 year old woman with a long-standing history for ill-defined consult. She can continue to drive without difficulties and is independent in activities of daily living she reports rhinitis but I attributed to Aricept, falls that seem to be neither syncopal, nor seizure, no mechanical in origin. Her falls are always propulsive, she falls forward she has very little warning or numb and she finds herself dominant how she got to the floor. Her mini entall test score 25 out of 30 points today which is stable and actually improved to a year ago. She is taking Lyrica for neuropathy and Lyrica is also a seizure medicine, I have wondered if Aricept may give her bradycardia but her heart rate today was 92.   Interval history from the tenth of January 2019, I have the pleasure of meeting today with [name], I follow her yearly and we usually look at her degree of fatigue, gait stability and cognitive status.  She scored today on a [place] cognitive assessment 23 out of 30 points and was last year 25 out of 30 points.  She missed 2 of the 5 recall words.  And the day of the week. She had another fall in October 2018 which she considered bad.  She describes that her legs entangled for some reason she fell onto her right hip she did not suffer fractures just a hematoma.  This happened on a non-slip even floor and she is still not sure what happened. No loss of conscience. Another  fall at her sons house on the day before New years. "I sometimes cannot feel my feet and they end up entangled or in the wrong place at the wrong time.  This time I fell and slipped over a coffee table.   05-26-2018.RV , yearly for disability forms.  [name] has become a grandmother 85 month ago and is very happy, son and his family live in [place]. She has survived an unknown primary cancer by now for many years. Still having neuropathy - as expected, and memory problems.   REVIEW OF SYSTEMS: Out of a complete 14 system review of symptoms, the patient complains only of the following symptoms, insomnia, depression, memory loss, neuropathy, and all other reviewed systems are negative.   ALLERGIES: Allergies  Allergen Reactions   Codeine Nausea Only   Orange Itching and Rash    HOME MEDICATIONS: Outpatient Medications Prior to Visit  Medication Sig Dispense Refill   amLODipine (NORVASC) 5 MG tablet Take 1 tablet (5 mg total) by mouth daily. 90 tablet 1   aspirin 81 MG tablet Take 81 mg by mouth daily. 2 TABS IN AM     butalbital-acetaminophen-caffeine (FIORICET) 50-325-40 MG tablet Take 1 tablet by mouth every 6 (six) hours as needed for headache (migraine abortion). TAKE 1 TABLET BY MOUTH EVERY 6 HOURS AS NEEDED FOR HEADACHE further refills should come from Neurology team . 10 tablet 5   calcium carbonate (TUMS - DOSED IN MG ELEMENTAL CALCIUM) 500 MG chewable tablet Chew 1 tablet by mouth daily as needed for indigestion or heartburn.     diclofenac (VOLTAREN) 75 MG EC tablet TK 1 T PO BID AFTER MEALS FOR INFLAMMATION / PAIN / SWELLING PRN ONLY  1   donepezil (ARICEPT) 10 MG tablet TAKE 1 TABLET(10 MG) BY MOUTH EVERY MORNING 90 tablet 1   DULoxetine (CYMBALTA) 60 MG capsule TAKE 1 CAPSULE(60 MG) BY MOUTH DAILY 90 capsule 0   Multiple Vitamin (MULTIVITAMIN WITH MINERALS) TABS tablet Take 1 tablet  by mouth daily.     orphenadrine (NORFLEX) 100 MG tablet Take 1 tablet (100 mg total)  by mouth 2 (two) times daily as needed for muscle spasms. Schedule follow up visit for more refills. [id number] 60 tablet 0   pregabalin (LYRICA) 200 MG capsule TAKE 1 CAPSULE(200 MG) BY MOUTH TWICE DAILY 60 capsule 0   simvastatin (ZOCOR) 40 MG tablet TAKE 1 TABLET BY MOUTH DAILY 90 tablet 0   spironolactone (ALDACTONE) 25 MG tablet Take 1 tablet (25 mg total) by mouth daily. 90 tablet 1   Vitamin D, Ergocalciferol, (DRISDOL) 1.25 MG (50000 UNIT) CAPS capsule TAKE 1 CAPSULE BY MOUTH 1 TIME A WEEK 13 capsule 2   zolpidem (AMBIEN) 5 MG tablet TAKE 1 TO 2 TABLETS(5 TO 10 MG) BY MOUTH EVERY NIGHT AT BEDTIME AS NEEDED 180 tablet 1   amphetamine-dextroamphetamine (ADDERALL XR) 20 MG 24 hr capsule Take 1 capsule (20 mg total) by mouth daily. 30 capsule 0   COVID-19 mRNA bivalent vaccine, Moderna, (MODERNA COVID-19 BIVAL BOOSTER) 50 MCG/0.5ML injection Inject into the muscle. 0.5 mL 0   influenza vac split quadrivalent PF (FLUARIX QUADRIVALENT) 0.5 ML injection Inject into the muscle. 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   Adjustment disorder    Allergic rhinitis    Allergy    Alopecia    Anxiety and depression    Arthritis    Blood transfusion without reported diagnosis    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   Clotting disorder ([place])    GERD (gastroesophageal reflux disease)    HLD (hyperlipidemia)    Hx pulmonary embolism    Hypertension    Iron deficiency anemia due to chronic blood loss 11/04/2016   Metrorrhagia    Neuromuscular disorder ([facility])    Neuropathy    Obesity    Ovarian cancer  ([place]) 04/2008   ChemoTherapy   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   Raynaud's syndrome    Right groin mass    Squamous cell carcinoma of skin    Ulcerative colitis ([place])    Vision abnormalities    Vitamin D deficiency     PAST SURGICAL HISTORY: Past Surgical History:  Procedure Laterality Date   ABDOMINAL HYSTERECTOMY  05/2008   TAH/BSO   AUGMENTATION MAMMAPLASTY Bilateral 10+ years   BREAST BIOPSY Left 2020   BREAST BIOPSY Right    BREAST REDUCTION SURGERY  2000   CESAREAN SECTION     1991   COLONOSCOPY  2004; 12/07/10   hemorrhoids   ESOPHAGOGASTRODUODENOSCOPY     GASTRIC BYPASS  1979   REDUCTION MAMMAPLASTY Bilateral    TOENAIL EXCISION Bilateral    TONSILLECTOMY      FAMILY HISTORY: Family History  Problem Relation Age of Onset   Pulmonary embolism Mother    Hypertension Father    Breast cancer Maternal Grandmother        diagnosed in early 38s   Hyperlipidemia Other    Breast cancer Sister 30   Colon cancer Neg Hx    Esophageal cancer Neg Hx  Rectal cancer Neg Hx    Stomach cancer Neg Hx     SOCIAL HISTORY: Social History   Socioeconomic History   Marital status: Divorced    Spouse name: Not on file   Number of children: 1   Years of education: Master's   Highest education level: Not on file  Occupational History    Employer: UNEMPLOYED    Comment: disabled  Tobacco Use   Smoking status: Every Day    Packs/day: 1.00    Years: 37.00    Pack years: 37.00    Types: Cigarettes    Start date: 06/16/1978   Smokeless tobacco: Never   Tobacco comments:    2- 7-16  STILL SMOKING  Vaping Use   Vaping Use: Never used  Substance and Sexual Activity   Alcohol use: Yes    Alcohol/week: 0.0 standard drinks    Comment: rare, 3 weekly   Drug use: No   Sexual activity: Not on file  Other Topics Concern   Not on file  Social History Narrative   Divorced   1 son /1 adopted adult son  2 dogs    Sales occupation on disability for now    Regular exercise-yes money stress   Daily caffeine use 2-3 and 2 cups of coffee   On since a security disability since 2012   Patient is right-handed.   Patient has a Master's degree.   Social Determinants of Health   Financial Resource Strain: Not on file  Food Insecurity: Not on file  Transportation Needs: Not on file  Physical Activity: Not on file  Stress: Stress Concern Present   Feeling of Stress : Very much  Social Connections: Not on file  Intimate Partner Violence: Not At Risk   Fear of Current or Ex-Partner: No   Emotionally Abused: No   Physically Abused: No   Sexually Abused: No      PHYSICAL EXAM  Vitals:   05/29/21 1315  BP: (!) 131/95  Pulse: 96  Weight: 212 lb (96.2 kg)  Height: _0  (1.727 m)     Body mass index is 32.23 kg/m.  Generalized: Well developed, in no acute distress  Cardiology: normal rate and rhythm, no murmur noted Neurological examination  Mentation: Alert oriented to time, place, history taking. Follows all commands speech and language fluent Cranial nerve II-XII: Pupils were equal round reactive to light. Extraocular movements were full, visual field were full on confrontational test. Facial sensation and strength were normal. Head turning and shoulder shrug  were normal and symmetric. Motor: The motor testing reveals 5 over 5 strength of all 4 extremities. Good symmetric motor tone is noted throughout.  Gait and station: Gait is normal.     Montreal Cognitive Assessment  05/29/2021 02/09/2020 11/25/2018 05/26/2018 05/22/2017  Visuospatial/ Executive (0/5) _1 Naming (0/3) _2 Attention: Read list of digits (0/2) 1 1 0 1 0  Attention: Read list of letters (0/1) _3 Attention: Serial 7 subtraction starting at 100 (0/3) _4 Language: Repeat phrase (0/2) _5 Language : Fluency (0/1) _6 Abstraction (0/2) _7 Delayed Recall (0/5) _8 Orientation (0/6) _9 Total _10 Adjusted Score (based on education) - - - - -    DIAGNOSTIC  DATA (LABS, IMAGING, TESTING) - I reviewed patient records, labs, notes, testing and imaging myself where available.  No flowsheet data found.   Lab Results  Component Value Date   WBC 8.2 02/07/2021   HGB 14.7 02/07/2021   HCT 44.0 02/07/2021   MCV 86.6 02/07/2021   PLT 256 02/07/2021      Component Value Date/Time   NA 141 02/07/2021 1427   NA 147 (H) 04/16/2017 1152   NA 139 12/27/2015 1156   K 3.3 (L) 02/07/2021 1427   K 3.4 04/16/2017 1152   K 3.9 12/27/2015 1156   CL 103 02/07/2021 1427   CL 101 04/16/2017 1152   CO2 29 02/07/2021 1427   CO2 30 04/16/2017 1152   CO2 27 12/27/2015 1156   GLUCOSE 76 02/07/2021 1427   GLUCOSE 89 04/16/2017 1152   BUN 21 (H) 02/07/2021 1427   BUN 18 04/16/2017 1152   BUN 14.9 12/27/2015 1156   CREATININE 0.97 02/07/2021 1427   CREATININE 1.07 (H) 03/01/2020 1626   CREATININE 0.9 12/27/2015 1156   CALCIUM 9.6 02/07/2021 1427   CALCIUM 9.8 04/16/2017 1152   CALCIUM 9.7 12/27/2015 1156   PROT 6.8 02/07/2021 1427   PROT 6.5 04/16/2017 1152   PROT 6.8 12/27/2015 1156   ALBUMIN 4.1 02/07/2021 1427   ALBUMIN 3.6 04/16/2017 1152   ALBUMIN 4.0 07/08/2016 1432   ALBUMIN 3.5 12/27/2015 1156   AST 24 02/07/2021 1427   AST 29 12/27/2015 1156   ALT 23 02/07/2021 1427   ALT 26 04/16/2017 1152   ALT 28 12/27/2015 1156   ALKPHOS 99 02/07/2021 1427   ALKPHOS 87 (H) 04/16/2017 1152   ALKPHOS 106 12/27/2015 1156   BILITOT 0.3 02/07/2021 1427   BILITOT 0.33 12/27/2015 1156   GFRNONAA >60 02/07/2021 1427   GFRNONAA 57 (L) 03/01/2020 1626   GFRAA 66 03/01/2020 1626   Lab Results  Component Value Date   CHOL 139 02/07/2021   HDL 42 02/07/2021   LDLCALC 63 02/07/2021   LDLDIRECT 98.7 09/04/2009   TRIG 172 (H) 02/07/2021   CHOLHDL 3.3 02/07/2021   Lab Results  Component Value Date   HGBA1C 6.2 02/09/2019   Lab Results  Component Value Date   [id number] 283  01/27/2012   Lab Results  Component Value Date   TSH 3.90 03/01/2020     ASSESSMENT AND PLAN 61 y.o. 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[place]), Adjustment disorder, Allergic rhinitis, Allergy, Alopecia, Anxiety and depression, Arthritis, Blood transfusion without reported diagnosis, BRCA1 positive (01/27/2013), Clotting disorder ([place]), GERD (gastroesophageal reflux disease), HLD (hyperlipidemia), pulmonary embolism, Hypertension, Iron deficiency anemia due to chronic blood loss (11/04/2016), Metrorrhagia, Neuromuscular disorder ([place]), Neuropathy, Obesity, Ovarian cancer ([place]) (04/2008), Ovarian cancer genetic susceptibility (01/27/2013), Raynaud's syndrome, Right groin mass, Squamous cell carcinoma of skin, Ulcerative colitis ([place]), Vision abnormalities, and Vitamin D deficiency. here with     ICD-10-CM   1. MCI (mild cognitive impairment) with memory loss  G31.84     2. Attention deficit disorder (ADD) without hyperactivity  F98.8     3. Migraine with aura and without status migrainosus, not intractable  G43.109     4. Reactive depression  F32.9     5. Insomnia due to medical condition  G47.01        Ima is doing fairly well. She feels that memory, sleep and mood are stable. We will continue Adderall XR 12m daily as this has been very helpful in managing daytime  sleepiness and attention deficit. We will continue Fioricet as needed for abortive therapy. She is not using regularly. She will continue Ambien 5-40m at bedtime.  She will continue Aricept 151mQHS as she does feel this helps. Memory complaints continue. She has word finding difficult and short term memory loss. Most likely related to previous chemo therapy, chronic pain as well as anxiety/depression. She is able to maintain her home and independent living. She is seeing Dr RoSima MatasHe has recommended ketamine treatments. She is not sure if her oncologist will do this. She was encouraged to talk with Dr  RoSima Matass his notes indicate possible treatment options with [facility] PlantConsider psychiatry referral as well. Neuropathy pain is managed with Lyrica prescribed by PCP/oncology. PDMP shows appropriate refills. Adderall refills sent, today. She will call when she needs refills of Ambien and Fioricet. She was encouraged to work on healthy lifestyle habits and follow up closely with PCP, neuropsychology and oncology. She will return to see usKorean 6 months. She verbalizes understanding and agreement with this plan.    No orders of the defined types were placed in this encounter.     Meds ordered this encounter  Medications   amphetamine-dextroamphetamine (ADDERALL XR) 20 MG 24 hr capsule    Sig: Take 1 capsule (20 mg total) by mouth daily.    Dispense:  30 capsule    Refill:  0    Order Specific Question:   Supervising Provider    Answer:   AHMelvenia Beam1[[id number]]       PPI RJJOAFNP-C 05/29/2021, 2:04 PM [facility] [address] [id number]

## 2021-05-29 NOTE — Patient Instructions (Addendum)
Below is our plan:  We will continue Adderall XR 62m daily, donepezil 160mevery night, Ambien 5-1042mvery night at bedtime and Fioricet as needed. Continue Lyrica as directed by PCP/oncology. Ask Dr PanSuzzette Righter she feels psychiatry and or psychology referral would be helpful.   Please make sure you are staying well hydrated. I recommend 50-60 ounces daily. Well balanced diet and regular exercise encouraged. Consistent sleep schedule with 6-8 hours recommended.   Please continue follow up with care team as directed.   Follow up with me in 6 months   You may receive a survey regarding today's visit. I encourage you to leave honest feed back as I do use this information to improve patient care. Thank you for seeing me today!   Management of Memory Problems   There are some general things you can do to help manage your memory problems.  Your memory may not in fact recover, but by using techniques and strategies you will be able to manage your memory difficulties better.   1)  Establish a routine. Try to establish and then stick to a regular routine.  By doing this, you will get used to what to expect and you will reduce the need to rely on your memory.  Also, try to do things at the same time of day, such as taking your medication or checking your calendar first thing in the morning. Think about think that you can do as a part of a regular routine and make a list.  Then enter them into a daily planner to remind you.  This will help you establish a routine.   2)  Organize your environment. Organize your environment so that it is uncluttered.  Decrease visual stimulation.  Place everyday items such as keys or cell phone in the same place every day (ie.  Basket next to front door) Use post it notes with a brief message to yourself (ie. Turn off light, lock the door) Use labels to indicate where things go (ie. Which cupboards are for food, dishes, etc.) Keep a notepad and pen by the telephone to  take messages   3)  Memory Aids A diary or journal/notebook/daily planner Making a list (shopping list, chore list, to do list that needs to be done) Using an alarm as a reminder (kitchen timer or cell phone alarm) Using cell phone to store information (Notes, Calendar, Reminders) Calendar/White board placed in a prominent position Post-it notes   In order for memory aids to be useful, you need to have good habits.  It's no good remembering to make a note in your journal if you don't remember to look in it.  Try setting aside a certain time of day to look in journal.   4)  Improving mood and managing fatigue. There may be other factors that contribute to memory difficulties.  Factors, such as anxiety, depression and tiredness can affect memory. Regular gentle exercise can help improve your mood and give you more energy. Simple relaxation techniques may help relieve symptoms of anxiety Try to get back to completing activities or hobbies you enjoyed doing in the past. Learn to pace yourself through activities to decrease fatigue. Find out about some local support groups where you can share experiences with others. Try and achieve 7-8 hours of sleep at night.

## 2021-06-07 DIAGNOSIS — M47816 Spondylosis without myelopathy or radiculopathy, lumbar region: Secondary | ICD-10-CM | POA: Diagnosis not present

## 2021-06-08 ENCOUNTER — Other Ambulatory Visit: Payer: Self-pay

## 2021-06-08 ENCOUNTER — Inpatient Hospital Stay: Payer: Medicare Other | Attending: Internal Medicine

## 2021-06-08 VITALS — BP 135/90 | HR 90 | Temp 97.8°F | Resp 18

## 2021-06-08 DIAGNOSIS — Z8589 Personal history of malignant neoplasm of other organs and systems: Secondary | ICD-10-CM | POA: Diagnosis not present

## 2021-06-08 DIAGNOSIS — D500 Iron deficiency anemia secondary to blood loss (chronic): Secondary | ICD-10-CM

## 2021-06-08 DIAGNOSIS — C801 Malignant (primary) neoplasm, unspecified: Secondary | ICD-10-CM

## 2021-06-08 DIAGNOSIS — Z452 Encounter for adjustment and management of vascular access device: Secondary | ICD-10-CM | POA: Insufficient documentation

## 2021-06-08 DIAGNOSIS — Z95828 Presence of other vascular implants and grafts: Secondary | ICD-10-CM

## 2021-06-08 MED ORDER — HEPARIN SOD (PORK) LOCK FLUSH 100 UNIT/ML IV SOLN
500.0000 [IU] | Freq: Once | INTRAVENOUS | Status: AC | PRN
  Administered 2021-06-08: 500 [IU]

## 2021-06-08 MED ORDER — SODIUM CHLORIDE 0.9% FLUSH
10.0000 mL | INTRAVENOUS | Status: DC | PRN
  Administered 2021-06-08: 10 mL

## 2021-06-08 NOTE — Patient Instructions (Signed)
Implanted [facility] Guide An implanted port is a device that is placed under the skin. It is usually placed in the chest. The device may vary based on the need. Implanted ports can be used to give IV medicine, to take blood, or to give fluids. You may have an implanted port if: You need IV medicine that would be irritating to the small veins in your hands or arms. You need IV medicines, such as chemotherapy, for a long period of time. You need IV nutrition for a long period of time. You may have fewer limitations when using a port than you would if you used other types of long-term IVs. You will also likely be able to return to normal activities after your incision heals. An implanted port has two main parts: Reservoir. The reservoir is the part where a needle is inserted to give medicines or draw blood. The reservoir is round. After the port is placed, it appears as a small, raised area under your skin. Catheter. The catheter is a small, thin tube that connects the reservoir to a vein. Medicine that is inserted into the reservoir goes into the catheter and then into the vein. How is my port accessed? To access your port: A numbing cream may be placed on the skin over the port site. Your health care provider will put on a mask and sterile gloves. The skin over your port will be cleaned carefully with a germ-killing soap and allowed to dry. Your health care provider will gently pinch the port and insert a needle into it. Your health care provider will check for a blood return to make sure the port is in the vein and is still working (patent). If your port needs to remain accessed to get medicine continuously (constant infusion), your health care provider will place a clear bandage (dressing) over the needle site. The dressing and needle will need to be changed every week, or as told by your health care provider. What is flushing? Flushing helps keep the port working. Follow instructions from your  health care provider about how and when to flush the port. Ports are usually flushed with saline solution or a medicine called heparin. The need for flushing will depend on how the port is used: If the port is only used from time to time to give medicines or draw blood, the port may need to be flushed: Before and after medicines have been given. Before and after blood has been drawn. As part of routine maintenance. Flushing may be recommended every 4-6 weeks. If a constant infusion is running, the port may not need to be flushed. Throw away any syringes in a disposal container that is meant for sharp items (sharps container). You can buy a sharps container from a pharmacy, or you can make one by using an empty hard plastic bottle with a cover. How long will my port stay implanted? The port can stay in for as long as your health care provider thinks it is needed. When it is time for the port to come out, a surgery will be done to remove it. The surgery will be similar to the procedure that was done to put the port in. Follow these instructions at home: Caring for your port and port site Flush your port as told by your health care provider. If you need an infusion over several days, follow instructions from your health care provider about how to take care of your port site. Make sure you: Change your  dressing as told by your health care provider. Wash your hands with soap and water for at least 20 seconds before and after you change your dressing. If soap and water are not available, use alcohol-based hand sanitizer. Place any used dressings or infusion bags into a plastic bag. Throw that bag in the trash. Keep the dressing that covers the needle clean and dry. Do not get it wet. Do not use scissors or sharp objects near the infusion tubing. Keep any external tubes clamped, unless they are being used. Check your port site every day for signs of infection. Check for: Redness, swelling, or  pain. Fluid or blood. Warmth. Pus or a bad smell. Protect the skin around the port site. Avoid wearing bra straps that rub or irritate the site. Protect the skin around your port from seat belts. Place a soft pad over your chest if needed. Bathe or shower as told by your health care provider. The site may get wet as long as you are not actively receiving an infusion. General instructions  Return to your normal activities as told by your health care provider. Ask your health care provider what activities are safe for you. Carry a medical alert card or wear a medical alert bracelet at all times. This will let health care providers know that you have an implanted port in case of an emergency. Where to find more information American Cancer Society: www.cancer.[place] of Clinical Oncology: www.cancer.net Contact a health care provider if: You have a fever or chills. You have redness, swelling, or pain at the port site. You have fluid or blood coming from your port site. Your incision feels warm to the touch. You have pus or a bad smell coming from the port site. Summary Implanted ports are usually placed in the chest for long-term IV access. Follow instructions from your health care provider about flushing the port and changing bandages (dressings). Take care of the area around your port by avoiding clothing that puts pressure on the area, and by watching for signs of infection. Protect the skin around your port from seat belts. Place a soft pad over your chest if needed. Contact a health care provider if you have a fever or you have redness, swelling, pain, fluid, or a bad smell at the port site. This information is not intended to replace advice given to you by your health care provider. Make sure you discuss any questions you have with your health care provider. Document Revised: 10/31/2020 Document Reviewed: 10/31/2020 Elsevier Patient Education  [place].

## 2021-06-28 ENCOUNTER — Encounter: Payer: Medicare Other | Attending: Psychology | Admitting: Psychology

## 2021-07-14 ENCOUNTER — Other Ambulatory Visit: Payer: Self-pay | Admitting: Internal Medicine

## 2021-07-16 ENCOUNTER — Telehealth: Payer: Medicare Other | Admitting: Nurse Practitioner

## 2021-07-16 ENCOUNTER — Encounter: Payer: Self-pay | Admitting: Nurse Practitioner

## 2021-07-16 DIAGNOSIS — R197 Diarrhea, unspecified: Secondary | ICD-10-CM

## 2021-07-16 DIAGNOSIS — Z9189 Other specified personal risk factors, not elsewhere classified: Secondary | ICD-10-CM

## 2021-07-16 NOTE — Patient Instructions (Signed)
Based on what you shared with me, I feel your condition warrants further evaluation as soon as possible at an Emergency department.  ? ?  ?If you are having a true medical emergency please call 911.   ?  ? ?Emergency [facility]  ?Get Driving Directions  ?[phone]  ?[address]  ?[place], [place] [address]  ?Open 24/7/365  ?  ?  ?[facility]  ?Get Driving Directions  ?[place]  ?[place], [place] [address]  ?Open 24/7/365  ?  ?Emergency [facility]  ?Get Driving Directions  ?[phone]  ?[address]  ?[place], [place] [address]  ?Open 24/7/365  ?  ?  ?[facility]  ?Get Driving Directions  ?[phone]  ?[address]  ?[place], [place] [address]  ?Open 24/7/365  ?  ?[place]  ?Emergency [place]  ?Get Driving Directions  ?[phone]  ?[address]  ?[place], [place] [address]  ?Open 24/7/365  ?  ?[place]  ?Emergency [place]  ?Get Driving Directions  ?[place]  ?[place],  [address]  ?Open 24/7/365  ?  ?[place]  ?Emergency [facility]  ?Get Driving Directions  ?[phone]  ?[address]  ?[place],  [address]  ?Open 24/7/365  ?  ?

## 2021-07-16 NOTE — Progress Notes (Signed)
Virtual Visit Consent   [name], you are scheduled for a virtual visit with a [facility] provider today.     Just as with appointments in the office, your consent must be obtained to participate.  Your consent will be active for this visit and any virtual visit you may have with one of our providers in the next 365 days.     If you have a MyChart account, a copy of this consent can be sent to you electronically.  All virtual visits are billed to your insurance company just like a traditional visit in the office.    As this is a virtual visit, video technology does not allow for your provider to perform a traditional examination.  This may limit your provider's ability to fully assess your condition.  If your provider identifies any concerns that need to be evaluated in person or the need to arrange testing (such as labs, EKG, etc.), we will make arrangements to do so.     Although advances in technology are sophisticated, we cannot ensure that it will always work on either your end or our end.  If the connection with a video visit is poor, the visit may have to be switched to a telephone visit.  With either a video or telephone visit, we are not always able to ensure that we have a secure connection.     I need to obtain your verbal consent now.   Are you willing to proceed with your visit today?    [name] has provided verbal consent on 07/16/2021 for a virtual visit (video or telephone).   Apolonio Schneiders, FNP   Date: 07/16/2021 5:26 PM   Virtual Visit via Video Note   I, Apolonio Schneiders, connected with  [name]  ([id number], Jun 03, 1960) on 07/16/21 at  6:00 PM EST by a video-enabled telemedicine application and verified that I am speaking with the correct person using two identifiers.  Location: Patient: Virtual Visit Location Patient: Home Provider: Virtual Visit Location Provider: Home Office   I discussed the limitations of evaluation and management by  telemedicine and the availability of in person appointments. The patient expressed understanding and agreed to proceed.    History of Present Illness: [name] is a 61 y.o. who identifies as a female who was assigned female at birth, and is being seen today with complaints of diarrhea for the past 3 days that has been worsening in the past 24 hours.  She has been vomiting as well.   She has chills without a fever   She has taken Pepto   She does feel that she is urinating with each bowel movement.   She feels that she has not kept anything down for the past 16 hours.   She was able to eat dinner last night and then today symptoms seemed to worsen.  She has had nausea associated with the symptoms.   She is feeling very weak  Problems:  Patient Active Problem List   Diagnosis Date Noted   Port-A-Cath in place 07/06/2018   Iron deficiency anemia due to chronic blood loss 11/04/2016   Port catheter in place 11/29/2015   Neuropathy associated with cancer ([place]) 11/28/2015   Migraine with aura and without status migrainosus, not intractable 05/31/2015   Benign paroxysmal positional vertigo 05/31/2015   Photophobia, bilateral 05/31/2015   Insomnia due to medical condition 05/31/2015   Hypoxemia 05/18/2014   Fatigue due to sleep pattern disturbance 05/18/2014  [place] 05/18/2014   Severe obesity (BMI >= 40) ([place]) 05/18/2014   Insomnia due to anxiety and fear 05/18/2014   Leg neuralgia 01/24/2014   Visit for preventive health examination 11/02/2013   Obesity (BMI 30-39.9) 11/02/2013   Neuropathy due to chemotherapeutic drug ([place]) 09/22/2013   Low back pain with sciatica 04/21/2013   Hx of ovarian cancer ?  04/21/2013   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   Skin lesion 10/22/2012   Encounter for routine gynecological examination 10/22/2012   Anemia 10/22/2012   Cognitive impairment 10/07/2012   GERD (gastroesophageal reflux  disease) 08/06/2012   Neurocognitive deficits 08/06/2012   Dry eye 06/23/2012   Eye pain 06/23/2012   BRCA gene positive 06/23/2012   Imbalance 06/23/2012   Alopecia, other 08/20/2011   Sleep disorder 08/20/2011   Edema 07/19/2011   Urinary incontinence 07/19/2011   Abnormal urine 10/19/2010   Cataracts, bilateral 10/19/2010   Hypokalemia 10/16/2010   ADJ DISORDER WITH MIXED ANXIETY & DEPRESSED MOOD 03/01/2009   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   DERMATITIS 03/01/2009   Carcinoma of unknown primary ([place]) 07/11/2008   CONSTIPATION, CHRONIC 07/11/2008   Vitamin D deficiency 09/15/2007   ENDOGENOUS OBESITY 06/12/2007   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   BARIATRIC SURGERY STATUS 06/12/2007   ALLERGIC RHINITIS 02/02/2007   CARCINOMA, SKIN, SQUAMOUS CELL 01/16/2007   TOBACCO USE 01/16/2007   HYPERLIPIDEMIA 12/17/2006   HYPERTENSION 12/17/2006   VARICOSE VEINS, LOWER EXTREMITIES 12/17/2006   PULMONARY EMBOLISM, HX OF 12/15/2006    Allergies:  Allergies  Allergen Reactions   Codeine Nausea Only   [place] Itching and Rash   Medications:  Current Outpatient Medications:    amLODipine (NORVASC) 5 MG tablet, Take 1 tablet (5 mg total) by mouth daily., Disp: 90 tablet, Rfl: 1   amphetamine-dextroamphetamine (ADDERALL XR) 20 MG 24 hr capsule, Take 1 capsule (20 mg total) by mouth daily., Disp: 30 capsule, Rfl: 0   aspirin 81 MG tablet, Take 81 mg by mouth daily. 2 TABS IN AM, Disp: , Rfl:    butalbital-acetaminophen-caffeine (FIORICET) 50-325-40 MG tablet, Take 1 tablet by mouth every 6 (six) hours as needed for headache (migraine abortion). TAKE 1 TABLET BY MOUTH EVERY 6 HOURS AS NEEDED FOR HEADACHE further refills should come from Neurology team ., Disp: 10 tablet, Rfl: 5   calcium carbonate (TUMS - DOSED IN MG ELEMENTAL CALCIUM) 500 MG chewable tablet, Chew 1 tablet by mouth daily as needed for indigestion or heartburn., Disp: , Rfl:    diclofenac (VOLTAREN) 75 MG  EC tablet, TK 1 T PO BID AFTER MEALS FOR INFLAMMATION / PAIN / SWELLING PRN ONLY, Disp: , Rfl: 1   donepezil (ARICEPT) 10 MG tablet, TAKE 1 TABLET(10 MG) BY MOUTH EVERY MORNING, Disp: 90 tablet, Rfl: 1   DULoxetine (CYMBALTA) 60 MG capsule, TAKE 1 CAPSULE(60 MG) BY MOUTH DAILY, Disp: 90 capsule, Rfl: 0   Multiple Vitamin (MULTIVITAMIN WITH MINERALS) TABS tablet, Take 1 tablet by mouth daily., Disp: , Rfl:    orphenadrine (NORFLEX) 100 MG tablet, Take 1 tablet (100 mg total) by mouth 2 (two) times daily as needed for muscle spasms. Schedule follow up visit for more refills. [phone], Disp: 60 tablet, Rfl: 0   pregabalin (LYRICA) 200 MG capsule, TAKE 1 CAPSULE(200 MG) BY MOUTH TWICE DAILY, Disp: 60 capsule, Rfl: 0   simvastatin (ZOCOR) 40 MG tablet, TAKE 1 TABLET BY MOUTH DAILY, Disp: 90 tablet, Rfl: 0   spironolactone (ALDACTONE)  25 MG tablet, Take 1 tablet (25 mg total) by mouth daily., Disp: 90 tablet, Rfl: 1   Vitamin D, Ergocalciferol, (DRISDOL) 1.25 MG (50000 UNIT) CAPS capsule, TAKE 1 CAPSULE BY MOUTH 1 TIME A WEEK, Disp: 13 capsule, Rfl: 2   zolpidem (AMBIEN) 5 MG tablet, TAKE 1 TO 2 TABLETS(5 TO 10 MG) BY MOUTH EVERY NIGHT AT BEDTIME AS NEEDED, Disp: 180 tablet, Rfl: 1  Current Facility-Administered Medications:    0.9 %  sodium chloride infusion, 500 mL, Intravenous, Once, Gatha Mayer, MD  Observations/Objective: Patient is well-developed, well-nourished in no acute distress.  Resting comfortably at home.  Head is normocephalic, atraumatic.  No labored breathing.  Speech is clear and coherent with logical content.  Patient is alert and oriented at baseline.    Assessment and Plan: 1. Diarrhea of presumed infectious origin  2. At high risk for dehydration Discussed with patient that due to worsening nature of illness she should be seen at ER today for hydration assistance.   Discussed that with worsening symptoms this could be infectious and may require antibiotics which  at this point will not be tolerated orally.   Discussed signs of dehydration and need for in person evaluation and hydration           Follow Up Instructions: I discussed the assessment and treatment plan with the patient. The patient was provided an opportunity to ask questions and all were answered. The patient agreed with the plan and demonstrated an understanding of the instructions.  A copy of instructions were sent to the patient via MyChart unless otherwise noted below.    The patient was advised to call back or seek an in-person evaluation if the symptoms worsen or if the condition fails to improve as anticipated.  Time:  I spent 15 minutes with the patient via telehealth technology discussing the above problems/concerns.    Apolonio Schneiders, FNP

## 2021-07-23 DIAGNOSIS — D2371 Other benign neoplasm of skin of right lower limb, including hip: Secondary | ICD-10-CM | POA: Diagnosis not present

## 2021-07-23 DIAGNOSIS — L858 Other specified epidermal thickening: Secondary | ICD-10-CM | POA: Diagnosis not present

## 2021-07-23 DIAGNOSIS — L821 Other seborrheic keratosis: Secondary | ICD-10-CM | POA: Diagnosis not present

## 2021-07-23 DIAGNOSIS — L814 Other melanin hyperpigmentation: Secondary | ICD-10-CM | POA: Diagnosis not present

## 2021-07-23 DIAGNOSIS — D2372 Other benign neoplasm of skin of left lower limb, including hip: Secondary | ICD-10-CM | POA: Diagnosis not present

## 2021-07-24 ENCOUNTER — Other Ambulatory Visit: Payer: Self-pay

## 2021-07-24 ENCOUNTER — Encounter: Payer: Medicare Other | Attending: Psychology | Admitting: Psychology

## 2021-07-24 ENCOUNTER — Encounter: Payer: Self-pay | Admitting: Psychology

## 2021-07-24 DIAGNOSIS — R29818 Other symptoms and signs involving the nervous system: Secondary | ICD-10-CM | POA: Insufficient documentation

## 2021-07-24 DIAGNOSIS — R5383 Other fatigue: Secondary | ICD-10-CM | POA: Insufficient documentation

## 2021-07-24 DIAGNOSIS — F331 Major depressive disorder, recurrent, moderate: Secondary | ICD-10-CM | POA: Diagnosis not present

## 2021-07-24 DIAGNOSIS — G479 Sleep disorder, unspecified: Secondary | ICD-10-CM | POA: Insufficient documentation

## 2021-07-24 DIAGNOSIS — R4189 Other symptoms and signs involving cognitive functions and awareness: Secondary | ICD-10-CM | POA: Insufficient documentation

## 2021-07-24 DIAGNOSIS — F4310 Post-traumatic stress disorder, unspecified: Secondary | ICD-10-CM | POA: Insufficient documentation

## 2021-07-24 NOTE — Progress Notes (Signed)
Neuropsychology Visit ? ?Patient:  [name]  ? ?DOB: 1960/10/09 ? ?MR Number: [id number] ? ?Location: [facility] ?[place] [place] ?[id number] ?[place] [address] ?Dept: [phone] ? ?Date of Service: 07/24/2021 ? ?Start: 11 AM ?End: 12 PM ? ?Today's visit was an in person visit that was conducted in my outpatient clinic office.  We had previously visited using telemedicine her last visit. ? ? ?Duration of Service: 1 Hour ? ?Provider/Observer:     Edgardo Roys PsyD ? ?Chief Complaint:      ?Chief Complaint  ?Patient presents with  ? Pain  ? Depression  ? Sleeping Problem  ? Post-Traumatic Stress Disorder  ? Memory Loss  ? ? 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  The patient has had residual cognitive changes similar to possible impacts of chemotherapy as well as significant residual pain symptoms.  The patient was diagnosed and treated for cancer 10 years ago and continues to have ongoing follow-up regarding possible recurrence.  The patient has had neuropathy, insomnia and memory loss post chemotherapy.  The patient has depressive symptoms that have developed and has a hard time motivating and a hard time keeping focused on life and staying engaged. ? ?The patient is continued struggle with issues related to coping and adjustment as well as ongoing cognitive difficulties and difficulties with completing task.  The patient has had a number of medical issues that have also complicated her status as well as psychosocial stressors with recent surgery for her mother where the patient was the caregiver post  surgery. ? ? ?Treatment Interventions:  Therapeutic interventions associated with chronic pain, residual effects of chemotherapy interventions symptoms associated with PTSD, depression and severe avoidance behaviors. ? ?Participation Level:   Active ? ?Participation Quality:  Appropriate and Attentive   ?   ?Behavioral Observation:  Well Groomed, Alert, and Appropriate.  ? ?Current Psychosocial Factors: The patient has struggled with ending the relationship that she has been in for some time and we discussed this issue back in September in our last visit.  The patient reports that she did break up with her girlfriend at that time but they ended up hanging out his friends and the relationship starting back again.  However, there was a recent incident that developed when the patient had to put her dog to sleep because of significant deterioration in health and was very emotional and difficult for the patient.  The girlfriend took it upon herself to initiate some fairly dramatic interventions at that time including contacting the patient's parents who are in their 21s and her son with overblown concerns.  The patient's all this is a very manipulative overstep and is caused further worsening of the relationship.  The patient is in the process of breaking up again and has collected the now to be ex-girlfriend's property so it can be picked up appropriately.  We talked about strategies for how to do this and the reasons for this happening.   ? ?Content of Session:   Today, we reviewed the current symptoms and continue to work on therapeutic interventions.  Patient with significant sleep depravation and has tried what she  can as far as medications and behavioral changes/sleep hygine.   ? ?Effectiveness of Interventions: The patient was very active and open during the visit we had an effective appointment. ? ?Target Goals:   Target goals include working on building coping skills and strategies around issues of depression  and residual effects of her chemotherapy including residual cognitive deficits. ? ?Goals Last Reviewed:   07/24/2021 ? ? 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  The patient has had residual cognitive changes similar to possible impacts of chemotherapy as well as significant residual pain symptoms.  The patient was diagnosed and treated for cancer 10 years ago and continues to have ongoing follow-up regarding possible recurrence.  The patient has had neuropathy, insomnia and memory loss post chemotherapy.  The patient has depressive symptoms that have developed and has a hard time motivating and a hard time keeping focused on life and staying engaged. ?  ?The patient describes depressive symptoms, including motivation, feelings of helplessness and hopelessness and anxiety about recurrence of her cancer.  The patient also describes ongoing chronic pain symptoms and neuropathy as well as insomnia and memory loss.  Today we worked specifically on behavioral adaptations and building coping skills around issues of her difficulty completing necessary daily activities and daily requirements as her cognitive change in cognitive impairments and impact they have had on emotional status and self-esteem issues are having a very significant deleterious effect on her. ? ?The patient is continued with symptoms and continues to be unable to work as her cognitive, stress, pain symptoms all leave her unable to perform full-time gainful employment.  The patient has a long history of significant avoidance behaviors particular on completing complex paperwork.  This is part of her overall condition and is not simply the patient not completing aspects that she  should.  The patient's avoidance is associated with anxiety and is part of her debilitating symptoms. ? ?The patient has a number of symptoms that have an overlap in their symptom spectrum.  These include residual PTSD-like symptoms, depression, significant pain and possible complex regional pain syndrome symptoms etc.  Reviewing her overall symptoms and status I do think that she may be a very good candidate for ketamine infusion in the future.  I have asked the patient to speak with her oncologist as these types of treatments are being done through oncology in the [facility] network.  If it is not something that she could get to the [facility] network they are also providing the services through [facility] at the [employer] as well.  This may take some time as getting insurance coverage may require active efforts but we will continue to look at this possibility going forward. ? ?Continued working on on coping with memory issues, depression and PTSD.   ? ?  ? ?Diagnosis:   Neurocognitive deficits ? ?Major depressive disorder, recurrent episode, moderate ([place]) ? ?Post traumatic stress disorder (PTSD) ? ?Sleep disturbance ? ? ? ?Ilean Skill, Psy.D. ?Clinical Psychologist ?Neuropsychologist ?     ?

## 2021-07-25 ENCOUNTER — Other Ambulatory Visit: Payer: Self-pay | Admitting: Neurology

## 2021-07-25 ENCOUNTER — Other Ambulatory Visit: Payer: Self-pay | Admitting: Internal Medicine

## 2021-07-25 NOTE — Telephone Encounter (Signed)
Last OV was on 05/29/21.  ?Next OV is scheduled for 11/27/21 .  ?Last RX was written on 04/23/21 for 170 tabs.  ? ?[facility] has been reviewed.  ?

## 2021-07-30 ENCOUNTER — Other Ambulatory Visit: Payer: Self-pay

## 2021-07-30 MED ORDER — ORPHENADRINE CITRATE ER 100 MG PO TB12: 100.0000 mg | ORAL_TABLET | Freq: Two times a day (BID) | ORAL | 0 refills | Status: DC | PRN

## 2021-07-30 MED ORDER — ROSUVASTATIN CALCIUM 5 MG PO TABS: 10.0000 mg | ORAL_TABLET | Freq: Every day | ORAL | 1 refills | Status: DC

## 2021-07-30 MED ORDER — ROSUVASTATIN CALCIUM 5 MG PO TABS: 5.0000 mg | ORAL_TABLET | Freq: Every day | ORAL | 3 refills | Status: DC

## 2021-07-30 NOTE — Telephone Encounter (Signed)
Spoke with patient and informed of Rx change she agreed and Rx was sent to the pharmacy ?

## 2021-07-30 NOTE — Telephone Encounter (Signed)
Inform patient that although  cholesterol  level has been  good on this dose of simvastatin  40 mg  ?Would like to try a different statin   because  simva has a potential interaction with the amlodipine  5 mg  that could give slight risk of myopathy .( Not common )     ? ?To be safer would like to switch to crestor 10 mg per day that doesn't have that potential interaction.  ?If you agree   please  send in  crestor 10 mg 1 po qd disp 90   refill x 1  ?Then plan fu visit in 4-6 months and we can recheck your cholesterol at that time ?

## 2021-07-30 NOTE — Progress Notes (Deleted)
crestor

## 2021-07-30 NOTE — Addendum Note (Signed)
Addended by: Geradine Girt D on: 07/30/2021 02:00 PM ? ? Modules accepted: Orders ? ?

## 2021-07-30 NOTE — Telephone Encounter (Signed)
LVM for pt to call back to speak to [facility] regarding medication management. ?

## 2021-08-01 ENCOUNTER — Other Ambulatory Visit: Payer: Self-pay | Admitting: Hematology & Oncology

## 2021-08-01 DIAGNOSIS — C801 Malignant (primary) neoplasm, unspecified: Secondary | ICD-10-CM

## 2021-08-07 ENCOUNTER — Other Ambulatory Visit: Payer: Self-pay | Admitting: Family Medicine

## 2021-08-07 MED ORDER — AMPHETAMINE-DEXTROAMPHET ER 20 MG PO CP24: 20.0000 mg | ORAL_CAPSULE | Freq: Every day | ORAL | 0 refills | Status: DC

## 2021-08-07 NOTE — Addendum Note (Signed)
Addended by: Darleen Crocker on: 08/07/2021 01:38 PM ? ? Modules accepted: Orders ? ?

## 2021-08-07 NOTE — Telephone Encounter (Signed)
Pt is requesting a refill for amphetamine-dextroamphetamine (ADDERALL XR) 20 MG 24 hr capsule . ? ?Pharmacy: [facility] [phone] ? ?

## 2021-08-08 ENCOUNTER — Ambulatory Visit: Payer: Medicare Other | Admitting: Hematology & Oncology

## 2021-08-08 ENCOUNTER — Other Ambulatory Visit: Payer: Medicare Other

## 2021-08-09 ENCOUNTER — Inpatient Hospital Stay: Payer: Medicare Other | Attending: Internal Medicine

## 2021-08-09 ENCOUNTER — Encounter: Payer: Self-pay | Admitting: Hematology & Oncology

## 2021-08-09 ENCOUNTER — Inpatient Hospital Stay (HOSPITAL_BASED_OUTPATIENT_CLINIC_OR_DEPARTMENT_OTHER): Payer: Medicare Other | Admitting: Hematology & Oncology

## 2021-08-09 ENCOUNTER — Inpatient Hospital Stay: Payer: Medicare Other

## 2021-08-09 ENCOUNTER — Other Ambulatory Visit: Payer: Self-pay

## 2021-08-09 VITALS — BP 146/96 | HR 92 | Temp 97.9°F | Resp 17 | Ht 68.0 in | Wt 212.1 lb

## 2021-08-09 DIAGNOSIS — G63 Polyneuropathy in diseases classified elsewhere: Secondary | ICD-10-CM | POA: Diagnosis not present

## 2021-08-09 DIAGNOSIS — D500 Iron deficiency anemia secondary to blood loss (chronic): Secondary | ICD-10-CM | POA: Diagnosis not present

## 2021-08-09 DIAGNOSIS — C801 Malignant (primary) neoplasm, unspecified: Secondary | ICD-10-CM

## 2021-08-09 DIAGNOSIS — Z95828 Presence of other vascular implants and grafts: Secondary | ICD-10-CM

## 2021-08-09 DIAGNOSIS — Z8589 Personal history of malignant neoplasm of other organs and systems: Secondary | ICD-10-CM | POA: Insufficient documentation

## 2021-08-09 DIAGNOSIS — G629 Polyneuropathy, unspecified: Secondary | ICD-10-CM | POA: Diagnosis not present

## 2021-08-09 DIAGNOSIS — E611 Iron deficiency: Secondary | ICD-10-CM | POA: Insufficient documentation

## 2021-08-09 LAB — CMP (CANCER CENTER ONLY)
ALT: 20 U/L (ref 0–44)
AST: 21 U/L (ref 15–41)
Albumin: 4.2 g/dL (ref 3.5–5.0)
Alkaline Phosphatase: 97 U/L (ref 38–126)
Anion gap: 9 (ref 5–15)
BUN: 20 mg/dL (ref 6–20)
CO2: 26 mmol/L (ref 22–32)
Calcium: 10 mg/dL (ref 8.9–10.3)
Chloride: 102 mmol/L (ref 98–111)
Creatinine: 0.84 mg/dL (ref 0.44–1.00)
GFR, Estimated: >60 mL/min (ref >60)
Glucose, Bld: 108 mg/dL — ABNORMAL HIGH (ref 70–99)
Potassium: 3.6 mmol/L (ref 3.5–5.1)
Sodium: 137 mmol/L (ref 135–145)
Total Bilirubin: 0.5 mg/dL (ref 0.3–1.2)
Total Protein: 6.9 g/dL (ref 6.5–8.1)

## 2021-08-09 LAB — CBC WITH DIFFERENTIAL (CANCER CENTER ONLY)
Abs Immature Granulocytes: 0.03 10*3/uL (ref 0.00–0.07)
Basophils Absolute: 0 10*3/uL (ref 0.0–0.1)
Basophils Relative: 0 %
Eosinophils Absolute: 0.2 10*3/uL (ref 0.0–0.5)
Eosinophils Relative: 3 %
HCT: 39.4 % (ref 36.0–46.0)
Hemoglobin: 13.1 g/dL (ref 12.0–15.0)
Immature Granulocytes: 0 %
Lymphocytes Relative: 18 %
Lymphs Abs: 1.2 10*3/uL (ref 0.7–4.0)
MCH: 28.8 pg (ref 26.0–34.0)
MCHC: 33.2 g/dL (ref 30.0–36.0)
MCV: 86.6 fL (ref 80.0–100.0)
Monocytes Absolute: 0.5 10*3/uL (ref 0.1–1.0)
Monocytes Relative: 8 %
Neutro Abs: 4.8 10*3/uL (ref 1.7–7.7)
Neutrophils Relative %: 71 %
Platelet Count: 226 10*3/uL (ref 150–400)
RBC: 4.55 MIL/uL (ref 3.87–5.11)
RDW: 14.9 % (ref 11.5–15.5)
WBC Count: 6.8 10*3/uL (ref 4.0–10.5)
nRBC: 0 % (ref 0.0–0.2)

## 2021-08-09 LAB — RETICULOCYTES
Immature Retic Fract: 20.1 % — ABNORMAL HIGH (ref 2.3–15.9)
RBC.: 4.57 MIL/uL (ref 3.87–5.11)
Retic Count, Absolute: 113.3 10*3/uL (ref 19.0–186.0)
Retic Ct Pct: 2.5 % (ref 0.4–3.1)

## 2021-08-09 MED ORDER — HEPARIN SOD (PORK) LOCK FLUSH 100 UNIT/ML IV SOLN
500.0000 [IU] | Freq: Once | INTRAVENOUS | Status: AC
  Administered 2021-08-09: 500 [IU] via INTRAVENOUS

## 2021-08-09 MED ORDER — SODIUM CHLORIDE 0.9% FLUSH
10.0000 mL | Freq: Once | INTRAVENOUS | Status: AC
  Administered 2021-08-09: 10 mL via INTRAVENOUS

## 2021-08-09 NOTE — Patient Instructions (Signed)
Implanted [facility] Guide ?An implanted port is a device that is placed under the skin. It is usually placed in the chest. The device may vary based on the need. Implanted ports can be used to give IV medicine, to take blood, or to give fluids. You may have an implanted port if: ?You need IV medicine that would be irritating to the small veins in your hands or arms. ?You need IV medicines, such as chemotherapy, for a long period of time. ?You need IV nutrition for a long period of time. ?You may have fewer limitations when using a port than you would if you used other types of long-term IVs. You will also likely be able to return to normal activities after your incision heals. ?An implanted port has two main parts: ?Reservoir. The reservoir is the part where a needle is inserted to give medicines or draw blood. The reservoir is round. After the port is placed, it appears as a small, raised area under your skin. ?Catheter. The catheter is a small, thin tube that connects the reservoir to a vein. Medicine that is inserted into the reservoir goes into the catheter and then into the vein. ?How is my port accessed? ?To access your port: ?A numbing cream may be placed on the skin over the port site. ?Your health care provider will put on a mask and sterile gloves. ?The skin over your port will be cleaned carefully with a germ-killing soap and allowed to dry. ?Your health care provider will gently pinch the port and insert a needle into it. ?Your health care provider will check for a blood return to make sure the port is in the vein and is still working (patent). ?If your port needs to remain accessed to get medicine continuously (constant infusion), your health care provider will place a clear bandage (dressing) over the needle site. The dressing and needle will need to be changed every week, or as told by your health care provider. ?What is flushing? ?Flushing helps keep the port working. Follow instructions from your  health care provider about how and when to flush the port. Ports are usually flushed with saline solution or a medicine called heparin. The need for flushing will depend on how the port is used: ?If the port is only used from time to time to give medicines or draw blood, the port may need to be flushed: ?Before and after medicines have been given. ?Before and after blood has been drawn. ?As part of routine maintenance. Flushing may be recommended every 4-6 weeks. ?If a constant infusion is running, the port may not need to be flushed. ?Throw away any syringes in a disposal container that is meant for sharp items (sharps container). You can buy a sharps container from a pharmacy, or you can make one by using an empty hard plastic bottle with a cover. ?How long will my port stay implanted? ?The port can stay in for as long as your health care provider thinks it is needed. When it is time for the port to come out, a surgery will be done to remove it. The surgery will be similar to the procedure that was done to put the port in. ?Follow these instructions at home: ?Caring for your port and port site ?Flush your port as told by your health care provider. ?If you need an infusion over several days, follow instructions from your health care provider about how to take care of your port site. Make sure you: ?Change your  dressing as told by your health care provider. ?Wash your hands with soap and water for at least 20 seconds before and after you change your dressing. If soap and water are not available, use alcohol-based hand sanitizer. ?Place any used dressings or infusion bags into a plastic bag. Throw that bag in the trash. ?Keep the dressing that covers the needle clean and dry. Do not get it wet. ?Do not use scissors or sharp objects near the infusion tubing. ?Keep any external tubes clamped, unless they are being used. ?Check your port site every day for signs of infection. Check for: ?Redness, swelling, or  pain. ?Fluid or blood. ?Warmth. ?Pus or a bad smell. ?Protect the skin around the port site. ?Avoid wearing bra straps that rub or irritate the site. ?Protect the skin around your port from seat belts. Place a soft pad over your chest if needed. ?Bathe or shower as told by your health care provider. The site may get wet as long as you are not actively receiving an infusion. ?General instructions ? ?Return to your normal activities as told by your health care provider. Ask your health care provider what activities are safe for you. ?Carry a medical alert card or wear a medical alert bracelet at all times. This will let health care providers know that you have an implanted port in case of an emergency. ?Where to find more information ?American Cancer Society: www.cancer.org ?American Society of Clinical Oncology: www.cancer.net ?Contact a health care provider if: ?You have a fever or chills. ?You have redness, swelling, or pain at the port site. ?You have fluid or blood coming from your port site. ?Your incision feels warm to the touch. ?You have pus or a bad smell coming from the port site. ?Summary ?Implanted ports are usually placed in the chest for long-term IV access. ?Follow instructions from your health care provider about flushing the port and changing bandages (dressings). ?Take care of the area around your port by avoiding clothing that puts pressure on the area, and by watching for signs of infection. ?Protect the skin around your port from seat belts. Place a soft pad over your chest if needed. ?Contact a health care provider if you have a fever or you have redness, swelling, pain, fluid, or a bad smell at the port site. ?This information is not intended to replace advice given to you by your health care provider. Make sure you discuss any questions you have with your health care provider. ?Document Revised: 10/31/2020 Document Reviewed: 10/31/2020 ?Elsevier Patient Education ? [place]. ? ?

## 2021-08-09 NOTE — Progress Notes (Signed)
?Hematology and Oncology Follow Up Visit ? ?[name] ?[id number] ?1960/12/28 61 y.o. ?08/09/2021 ? ? 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 ?Current Therapy:   ?IV iron --Venofer given on 02/14/2021 ?  ?Interim History:  [name] is here today for follow-up.  I just feel bad for her.  [name] lost her beloved dog a few weeks ago.  He had lung cancer.  He had been dealing with cancer for quite a while.  [name] just felt that it was time for him to go.  This has been really really hard on her.  I totally understand this. ? ?[name] did not go to [place] in January.  [name] did not want to leave her dog.  Again, I totally understand this. ? ?[name] did not need to have surgery for her rotator cuff.  Thankfully, [name] seems to be doing pretty well with this. ? ?[name] is worried about her breasts.  [name] has been scheduled for a mammogram but this had to be postponed.  We will have to make sure that [name] gets another 1. ? ?[name] still has the neuropathy.  [name] had a bad fall a few weeks ago.  [name] fell on her face.  Nothing broke, thankfully.  [name] did have a hematoma on her scalp. ? ?[name] does have the iron deficiency.  We do give her iron on occasion.  Back only last saw her in September 2022, her iron saturation was only 15%.  We did give her Venofer. ? ?[name] has had no fever.  [name] has had no cough.  Thankfully, [name] has not had any problems with COVID. ? ?[name] is having no problems with bowels or bladder. ? ?Overall, I would have said that her performance status is probably ECOG 1.   ? ?Medications:  ?Allergies as of 08/09/2021   ? ?   Reactions  ? Codeine Nausea Only  ? Orange Itching, Rash  ? ?  ? ?  ?Medication List  ?  ? ?  ? Accurate as of August 09, 2021  3:16 PM. If you have any questions, ask your nurse or doctor.  ?  ?  ? ?  ? ?amLODipine 5 MG tablet ?Commonly known as: NORVASC ?Take 1 tablet (5 mg  total) by mouth daily. ?  ?amphetamine-dextroamphetamine 20 MG 24 hr capsule ?Commonly known as: Adderall XR ?Take 1 capsule (20 mg total) by mouth daily. ?  ?aspirin 81 MG tablet ?Take 81 mg by mouth daily. 2 TABS IN AM ?  ?butalbital-acetaminophen-caffeine 50-325-40 MG tablet ?Commonly known as: FIORICET ?Take 1 tablet by mouth every 6 (six) hours as needed for headache (migraine abortion). TAKE 1 TABLET BY MOUTH EVERY 6 HOURS AS NEEDED FOR HEADACHE further refills should come from Neurology team . ?  ?calcium carbonate 500 MG chewable tablet ?Commonly known as: TUMS - dosed in mg elemental calcium ?Chew 1 tablet by mouth daily as needed for indigestion or heartburn. ?  ?diclofenac 75 MG EC tablet ?Commonly known as: VOLTAREN ?TK 1 T PO BID AFTER MEALS FOR INFLAMMATION / PAIN / SWELLING PRN ONLY ?  ?donepezil 10 MG tablet ?Commonly known as: ARICEPT ?TAKE 1 TABLET(10 MG) BY MOUTH EVERY MORNING ?  ?DULoxetine 60 MG capsule ?Commonly known as: CYMBALTA ?TAKE 1 CAPSULE(60 MG) BY MOUTH DAILY ?  ?multivitamin with minerals Tabs tablet ?Take 1 tablet by mouth daily. ?  ?orphenadrine 100 MG tablet ?Commonly known as: NORFLEX ?Take 1 tablet (  100 mg total) by mouth 2 (two) times daily as needed for muscle spasms. Schedule follow up visit for more refills. [phone] ?  ?pregabalin 200 MG capsule ?Commonly known as: LYRICA ?TAKE 1 CAPSULE(200 MG) BY MOUTH TWICE DAILY ?  ?rosuvastatin 5 MG tablet ?Commonly known as: Crestor ?Take 2 tablets (10 mg total) by mouth daily. ?  ?spironolactone 25 MG tablet ?Commonly known as: ALDACTONE ?Take 1 tablet (25 mg total) by mouth daily. ?  ?Vitamin D (Ergocalciferol) 1.25 MG (50000 UNIT) Caps capsule ?Commonly known as: DRISDOL ?TAKE 1 CAPSULE BY MOUTH 1 TIME A WEEK ?  ?zolpidem 5 MG tablet ?Commonly known as: AMBIEN ?TAKE 1 TO 2 TABLETS(5 TO 10 MG) BY MOUTH EVERY NIGHT AT BEDTIME AS NEEDED ?  ? ?  ? ? ?Allergies:  ?Allergies  ?Allergen Reactions  ? Codeine Nausea Only  ? Orange  Itching and Rash  ? ? ?Past Medical History, Surgical history, Social history, and Family History were reviewed and updated. ? ?Review of Systems: ?Review of Systems  ?Constitutional: Negative.   ?HENT: Negative.    ?Eyes: Negative.   ?Respiratory: Negative.    ?Cardiovascular: Negative.   ?Gastrointestinal:  Positive for abdominal pain.  ?Genitourinary: Negative.   ?Musculoskeletal:  Positive for joint pain.  ?Skin: Negative.   ?Neurological:  Positive for tingling.  ?Endo/Heme/Allergies: Negative.   ?Psychiatric/Behavioral: Negative.    ?  ? ?Physical Exam: ? height is 5' 8"  (1.727 m) and weight is 212 lb 1.9 oz (96.2 kg). Her oral temperature is 97.9 ?F (36.6 ?C). Her blood pressure is 146/96 (abnormal) and her pulse is 92. Her respiration is 17 and oxygen saturation is 100%.  ? ?Wt Readings from Last 3 Encounters:  ?08/09/21 212 lb 1.9 oz (96.2 kg)  ?05/29/21 212 lb (96.2 kg)  ?05/10/21 216 lb (98 kg)  ?Her vital signs show temperature of 97.9.  Pulse 92.  Blood pressure 132/91.  Weight is 214 pounds. ? ?Physical Exam ?Vitals reviewed.  ?Constitutional:   ?   Comments: Her breast exam shows bilateral breast reductions.  I really cannot palpate any thing unusual with respect to the right breast.  There is no distinct mass.  There is no axillary adenopathy.  ?HENT:  ?   Head: Normocephalic and atraumatic.  ?Eyes:  ?   Pupils: Pupils are equal, round, and reactive to light.  ?Cardiovascular:  ?   Rate and Rhythm: Normal rate and regular rhythm.  ?   Heart sounds: Normal heart sounds.  ?Pulmonary:  ?   Effort: Pulmonary effort is normal.  ?   Breath sounds: Normal breath sounds.  ?Abdominal:  ?   General: Bowel sounds are normal.  ?   Palpations: Abdomen is soft.  ?Musculoskeletal:     ?   General: No tenderness or deformity. Normal range of motion.  ?   Cervical back: Normal range of motion.  ?   Comments: Extremities shows decreased range of motion of the right shoulder.  [name] has decreased abduction and external  rotation.  ?Lymphadenopathy:  ?   Cervical: No cervical adenopathy.  ?Skin: ?   General: Skin is warm and dry.  ?   Findings: No erythema or rash.  ?Neurological:  ?   Mental Status: [name] is alert and oriented to person, place, and time.  ?Psychiatric:     ?   Behavior: Behavior normal.     ?   Thought Content: Thought content normal.     ?   Judgment: Judgment  normal.  ? ? ? ?Lab Results  ?Component Value Date  ? WBC 6.8 08/09/2021  ? HGB 13.1 08/09/2021  ? HCT 39.4 08/09/2021  ? MCV 86.6 08/09/2021  ? PLT 226 08/09/2021  ? ?Lab Results  ?Component Value Date  ? FERRITIN 38 02/07/2021  ? IRON 51 02/07/2021  ? TIBC 339 02/07/2021  ? UIBC 288 02/07/2021  ? IRONPCTSAT 15 (L) 02/07/2021  ? ?Lab Results  ?Component Value Date  ? RETICCTPCT 2.5 08/09/2021  ? RBC 4.55 08/09/2021  ? RBC 4.57 08/09/2021  ? ?No results found for: KPAFRELGTCHN, LAMBDASER, KAPLAMBRATIO ?No results found for: IGGSERUM, IGA, IGMSERUM ?No results found for: TOTALPROTELP, ALBUMINELP, A1GS, A2GS, BETS, BETA2SER, GAMS, MSPIKE, SPEI ?  Chemistry   ?   ?Component Value Date/Time  ? NA 141 02/07/2021 1427  ? NA 147 (H) 04/16/2017 1152  ? NA 139 12/27/2015 1156  ? K 3.3 (L) 02/07/2021 1427  ? K 3.4 04/16/2017 1152  ? K 3.9 12/27/2015 1156  ? CL 103 02/07/2021 1427  ? CL 101 04/16/2017 1152  ? CO2 29 02/07/2021 1427  ? CO2 30 04/16/2017 1152  ? CO2 27 12/27/2015 1156  ? BUN 21 (H) 02/07/2021 1427  ? BUN 18 04/16/2017 1152  ? BUN 14.9 12/27/2015 1156  ? CREATININE 0.97 02/07/2021 1427  ? CREATININE 1.07 (H) 03/01/2020 1626  ? CREATININE 0.9 12/27/2015 1156  ?    ?Component Value Date/Time  ? CALCIUM 9.6 02/07/2021 1427  ? CALCIUM 9.8 04/16/2017 1152  ? CALCIUM 9.7 12/27/2015 1156  ? ALKPHOS 99 02/07/2021 1427  ? ALKPHOS 87 (H) 04/16/2017 1152  ? ALKPHOS 106 12/27/2015 1156  ? AST 24 02/07/2021 1427  ? AST 29 12/27/2015 1156  ? ALT 23 02/07/2021 1427  ? ALT 26 04/16/2017 1152  ? ALT 28 12/27/2015 1156  ? BILITOT 0.3 02/07/2021 1427  ? BILITOT 0.33  12/27/2015 1156  ?  ? ? ? ?Impression and Plan: [name] is very pleasant 61 yo [place] American female with a remote history of a poorly differentiated carcinoma which was felt to be ovarian, BRCA +. [name] complete

## 2021-08-10 ENCOUNTER — Telehealth: Payer: Self-pay | Admitting: *Deleted

## 2021-08-10 ENCOUNTER — Encounter: Payer: Self-pay | Admitting: *Deleted

## 2021-08-10 LAB — IRON AND IRON BINDING CAPACITY (CC-WL,HP ONLY)
Iron: 44 ug/dL (ref 28–170)
Saturation Ratios: 12 % (ref 10.4–31.8)
TIBC: 382 ug/dL (ref 250–450)
UIBC: 338 ug/dL (ref 148–442)

## 2021-08-10 LAB — FERRITIN: Ferritin: 53 ng/mL (ref 11–307)

## 2021-08-10 NOTE — Telephone Encounter (Signed)
Per scheduling message [name] - called and lvm for call back to schedule (1) dose of IV Iron ?

## 2021-08-14 ENCOUNTER — Ambulatory Visit: Payer: Medicare Other

## 2021-08-14 ENCOUNTER — Telehealth: Payer: Self-pay | Admitting: Hematology & Oncology

## 2021-08-14 NOTE — Telephone Encounter (Signed)
Called to schedule 1 dose of iron per 3/31 sch msg , left voicemail ?

## 2021-08-16 ENCOUNTER — Ambulatory Visit: Payer: Medicare Other

## 2021-08-17 ENCOUNTER — Other Ambulatory Visit: Payer: Self-pay | Admitting: Hematology & Oncology

## 2021-08-17 DIAGNOSIS — D500 Iron deficiency anemia secondary to blood loss (chronic): Secondary | ICD-10-CM

## 2021-08-17 DIAGNOSIS — Z1231 Encounter for screening mammogram for malignant neoplasm of breast: Secondary | ICD-10-CM

## 2021-08-17 DIAGNOSIS — C801 Malignant (primary) neoplasm, unspecified: Secondary | ICD-10-CM

## 2021-08-20 ENCOUNTER — Ambulatory Visit
Admission: RE | Admit: 2021-08-20 | Discharge: 2021-08-20 | Disposition: A | Discharge status: Home / Self Care | Payer: Medicare Other | Source: Ambulatory Visit | Attending: Hematology & Oncology | Admitting: Hematology & Oncology

## 2021-08-20 DIAGNOSIS — D500 Iron deficiency anemia secondary to blood loss (chronic): Secondary | ICD-10-CM

## 2021-08-20 DIAGNOSIS — C801 Malignant (primary) neoplasm, unspecified: Secondary | ICD-10-CM

## 2021-08-20 DIAGNOSIS — Z1231 Encounter for screening mammogram for malignant neoplasm of breast: Secondary | ICD-10-CM

## 2021-08-28 ENCOUNTER — Inpatient Hospital Stay: Payer: Medicare Other | Attending: Internal Medicine

## 2021-08-28 VITALS — BP 145/90 | HR 71 | Temp 98.0°F | Resp 18 | Wt 210.2 lb

## 2021-08-28 DIAGNOSIS — D509 Iron deficiency anemia, unspecified: Secondary | ICD-10-CM | POA: Insufficient documentation

## 2021-08-28 DIAGNOSIS — D500 Iron deficiency anemia secondary to blood loss (chronic): Secondary | ICD-10-CM

## 2021-08-28 DIAGNOSIS — C801 Malignant (primary) neoplasm, unspecified: Secondary | ICD-10-CM

## 2021-08-28 DIAGNOSIS — Z95828 Presence of other vascular implants and grafts: Secondary | ICD-10-CM

## 2021-08-28 MED ORDER — HEPARIN SOD (PORK) LOCK FLUSH 100 UNIT/ML IV SOLN
500.0000 [IU] | Freq: Once | INTRAVENOUS | Status: AC | PRN
  Administered 2021-08-28: 500 [IU]

## 2021-08-28 MED ORDER — SODIUM CHLORIDE 0.9 % IV SOLN
200.0000 mg | Freq: Once | INTRAVENOUS | Status: AC
  Administered 2021-08-28: 200 mg via INTRAVENOUS
  Filled 2021-08-28: qty 10

## 2021-08-28 MED ORDER — SODIUM CHLORIDE 0.9% FLUSH
10.0000 mL | Freq: Once | INTRAVENOUS | Status: AC | PRN
  Administered 2021-08-28: 10 mL

## 2021-08-28 MED ORDER — SODIUM CHLORIDE 0.9 % IV SOLN: Freq: Once | INTRAVENOUS | Status: AC

## 2021-08-28 NOTE — Patient Instructions (Signed)

## 2021-08-28 NOTE — Progress Notes (Signed)
Patient declined post Venofer infusion observation. Verbal education provided. ?

## 2021-08-29 ENCOUNTER — Encounter: Payer: Medicare Other | Attending: Psychology | Admitting: Psychology

## 2021-08-29 ENCOUNTER — Encounter: Payer: Self-pay | Admitting: Psychology

## 2021-08-29 DIAGNOSIS — R4189 Other symptoms and signs involving cognitive functions and awareness: Secondary | ICD-10-CM | POA: Insufficient documentation

## 2021-08-29 DIAGNOSIS — F331 Major depressive disorder, recurrent, moderate: Secondary | ICD-10-CM | POA: Insufficient documentation

## 2021-08-29 DIAGNOSIS — G479 Sleep disorder, unspecified: Secondary | ICD-10-CM | POA: Diagnosis not present

## 2021-08-29 DIAGNOSIS — R29818 Other symptoms and signs involving the nervous system: Secondary | ICD-10-CM | POA: Insufficient documentation

## 2021-08-29 DIAGNOSIS — F4310 Post-traumatic stress disorder, unspecified: Secondary | ICD-10-CM | POA: Insufficient documentation

## 2021-08-29 NOTE — Progress Notes (Signed)
Neuropsychology Visit ? ?Patient:  [name]  ? ?DOB: Sep 22, 1960 ? ?MR Number: [id number] ? ?Location: [facility] ?[place] [place] ?[id number] ?[place] [address] ?Dept: [phone] ? ?Date of Service: 08/29/2021 ? ?Start: 11 AM ?End: 12 PM ? ?Today's visit was an in person visit that was conducted in my outpatient clinic office.  The patient and myself were present for this visit. ? ? ?Duration of Service: 1 Hour ? ?Provider/Observer:     Edgardo Roys PsyD ? ?Chief Complaint:      ?Chief Complaint  ?Patient presents with  ? Pain  ? Depression  ? Post-Traumatic Stress Disorder  ? Memory Loss  ? Sleeping Problem  ? ? 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  The patient has had residual cognitive changes similar to possible impacts of chemotherapy as well as significant residual pain symptoms.  The patient was diagnosed and treated for cancer 10 years ago and continues to have ongoing follow-up regarding possible recurrence.  The patient has had neuropathy, insomnia and memory loss post chemotherapy.  The patient has depressive symptoms that have developed and has a hard time motivating and a hard time keeping focused on life and staying engaged. ? ?The patient is continued struggle with issues related to coping and adjustment as well as ongoing cognitive difficulties and difficulties with completing task.  The patient has had a number of medical issues that have also complicated her status as well as psychosocial stressors with recent surgery for her mother where the patient was the caregiver post  surgery. ? ? ?Treatment Interventions:  Therapeutic interventions associated with chronic pain, residual effects of chemotherapy interventions symptoms associated with PTSD, depression and severe avoidance behaviors. ? ?Participation Level:   Active ? ?Participation Quality:  Appropriate and Attentive   ?   ?Behavioral Observation:  Well Groomed, Alert, and Appropriate.  ? ?Current Psychosocial Factors: The patient reports that she has been doing better overall and some of the relationship issues that were very stressful to her have settled down and she is coping better with these particular issues.  The patient reports that she continues to improve as far as her coping and adjustment issues in her cognition have been improving to some degree.  She continues to struggle with sleep but her sleep has been improved which has helped overall. ? ?Content of Session:   Today, we reviewed the current symptoms and continue to work on therapeutic interventions.  The patient has been actively working on sleep hygiene issues and experiencing improvement to some degree in this and has been doing much better coping with some of the residual cognitive difficulties and depression postchemotherapy. ? ?Effectiveness of Interventions: The patient was very active and open during the visit we had an effective appointment. ? ?Target Goals:   Target goals include working on building coping skills and strategies around issues of depression and residual effects of her chemotherapy including residual cognitive deficits. ? ?Goals Last Reviewed:   07/24/2021 ? ? 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  The patient has had residual cognitive changes similar to  possible impacts of chemotherapy as well as significant residual pain symptoms.  The patient was diagnosed and treated for cancer 10 years ago and continues to have ongoing follow-up regarding possible recurrence.  The patient has had neuropathy, insomnia and memory loss post chemotherapy.  The patient has depressive symptoms that have developed and has a hard time motivating and a hard time keeping focused on life and staying engaged. ?  ?The patient describes depressive symptoms, including motivation, feelings of helplessness and hopelessness and anxiety about recurrence of her cancer.  The patient also describes ongoing chronic pain symptoms and neuropathy as well as insomnia and memory loss.  Today we worked specifically on behavioral adaptations and building coping skills around issues of her difficulty completing necessary daily activities and daily requirements as her cognitive change in cognitive impairments and impact they have had on emotional status and self-esteem issues are having a very significant deleterious effect on her. ? ?The patient is continued with symptoms and continues to be unable to work as her cognitive, stress, pain symptoms all leave her unable to perform full-time gainful employment.  The patient has a long history of significant avoidance behaviors particular on completing complex paperwork.  This is part of her overall condition and is not simply the patient not completing aspects that she should.  The patient's avoidance is associated with anxiety and is part of her debilitating symptoms. ? ?The patient has a number of symptoms that have an overlap in their symptom spectrum.  These include residual PTSD-like symptoms, depression, significant pain and possible complex regional pain syndrome symptoms etc.  Reviewing her overall symptoms and status I do think that she may be a very good candidate for ketamine infusion in the future.  I have asked the patient to speak with her  oncologist as these types of treatments are being done through oncology in the [facility] network.  If it is not something that she could get to the [facility] network they are also providing the services through [facility] at the [employer] as well.  This may take some time as getting insurance coverage may require active efforts but we will continue to look at this possibility going forward. ? ?Continued working on on coping with memory issues, depression and PTSD.   ? ?  ? ?Diagnosis:   Neurocognitive deficits ? ?Major depressive disorder, recurrent episode, moderate ([place]) ? ?Post traumatic stress disorder (PTSD) ? ?Sleep disturbance ? ? ? ?Ilean Skill, Psy.D. ?Clinical Psychologist ?Neuropsychologist ?     ?

## 2021-09-05 ENCOUNTER — Other Ambulatory Visit: Payer: Self-pay | Admitting: Family Medicine

## 2021-09-05 MED ORDER — AMPHETAMINE-DEXTROAMPHET ER 20 MG PO CP24: 20.0000 mg | ORAL_CAPSULE | Freq: Every day | ORAL | 0 refills | Status: DC

## 2021-09-05 NOTE — Telephone Encounter (Signed)
I have routed this request to [name] Presto, NP for review. The pt is due for the medication and [name] registry was verified. ? ?

## 2021-09-05 NOTE — Telephone Encounter (Signed)
Pt requesting refill for amphetamine-dextroamphetamine (ADDERALL XR) 20 MG 24 hr capsule at [facility] #[id number]. ?Pt is leaving the state tomorrow and is requesting ability to pick up prescription today or in the morning.  ?

## 2021-09-10 ENCOUNTER — Telehealth: Payer: Self-pay | Admitting: Family Medicine

## 2021-09-10 NOTE — Telephone Encounter (Signed)
Pt would like a call from the nurse to discuss medication refill foramphetamine-dextroamphetamine (ADDERALL XR) 20 MG 24 hr capsule. Refill was not at the pharmacy when left to go out of town. Can you send refill to: ?[facility] [place] ?[place], [place] ?Phone: [phone] ? ?

## 2021-09-17 MED ORDER — AMPHETAMINE-DEXTROAMPHET ER 20 MG PO CP24: 20.0000 mg | ORAL_CAPSULE | Freq: Every day | ORAL | 0 refills | Status: DC

## 2021-10-02 ENCOUNTER — Inpatient Hospital Stay: Payer: Medicare Other | Attending: Internal Medicine

## 2021-10-02 VITALS — BP 131/96 | HR 77 | Temp 98.3°F | Resp 18

## 2021-10-02 DIAGNOSIS — Z859 Personal history of malignant neoplasm, unspecified: Secondary | ICD-10-CM | POA: Diagnosis not present

## 2021-10-02 DIAGNOSIS — Z452 Encounter for adjustment and management of vascular access device: Secondary | ICD-10-CM | POA: Insufficient documentation

## 2021-10-02 DIAGNOSIS — Z95828 Presence of other vascular implants and grafts: Secondary | ICD-10-CM

## 2021-10-02 MED ORDER — SODIUM CHLORIDE 0.9% FLUSH
10.0000 mL | Freq: Once | INTRAVENOUS | Status: AC
  Administered 2021-10-02: 10 mL via INTRAVENOUS

## 2021-10-02 MED ORDER — HEPARIN SOD (PORK) LOCK FLUSH 100 UNIT/ML IV SOLN
500.0000 [IU] | Freq: Once | INTRAVENOUS | Status: AC
  Administered 2021-10-02: 500 [IU] via INTRAVENOUS

## 2021-10-02 NOTE — Patient Instructions (Signed)
Implanted [facility] Guide An implanted port is a device that is placed under the skin. It is usually placed in the chest. The device may vary based on the need. Implanted ports can be used to give IV medicine, to take blood, or to give fluids. You may have an implanted port if: You need IV medicine that would be irritating to the small veins in your hands or arms. You need IV medicines, such as chemotherapy, for a long period of time. You need IV nutrition for a long period of time. You may have fewer limitations when using a port than you would if you used other types of long-term IVs. You will also likely be able to return to normal activities after your incision heals. An implanted port has two main parts: Reservoir. The reservoir is the part where a needle is inserted to give medicines or draw blood. The reservoir is round. After the port is placed, it appears as a small, raised area under your skin. Catheter. The catheter is a small, thin tube that connects the reservoir to a vein. Medicine that is inserted into the reservoir goes into the catheter and then into the vein. How is my port accessed? To access your port: A numbing cream may be placed on the skin over the port site. Your health care provider will put on a mask and sterile gloves. The skin over your port will be cleaned carefully with a germ-killing soap and allowed to dry. Your health care provider will gently pinch the port and insert a needle into it. Your health care provider will check for a blood return to make sure the port is in the vein and is still working (patent). If your port needs to remain accessed to get medicine continuously (constant infusion), your health care provider will place a clear bandage (dressing) over the needle site. The dressing and needle will need to be changed every week, or as told by your health care provider. What is flushing? Flushing helps keep the port working. Follow instructions from your  health care provider about how and when to flush the port. Ports are usually flushed with saline solution or a medicine called heparin. The need for flushing will depend on how the port is used: If the port is only used from time to time to give medicines or draw blood, the port may need to be flushed: Before and after medicines have been given. Before and after blood has been drawn. As part of routine maintenance. Flushing may be recommended every 4-6 weeks. If a constant infusion is running, the port may not need to be flushed. Throw away any syringes in a disposal container that is meant for sharp items (sharps container). You can buy a sharps container from a pharmacy, or you can make one by using an empty hard plastic bottle with a cover. How long will my port stay implanted? The port can stay in for as long as your health care provider thinks it is needed. When it is time for the port to come out, a surgery will be done to remove it. The surgery will be similar to the procedure that was done to put the port in. Follow these instructions at home: Caring for your port and port site Flush your port as told by your health care provider. If you need an infusion over several days, follow instructions from your health care provider about how to take care of your port site. Make sure you: Change your  dressing as told by your health care provider. Wash your hands with soap and water for at least 20 seconds before and after you change your dressing. If soap and water are not available, use alcohol-based hand sanitizer. Place any used dressings or infusion bags into a plastic bag. Throw that bag in the trash. Keep the dressing that covers the needle clean and dry. Do not get it wet. Do not use scissors or sharp objects near the infusion tubing. Keep any external tubes clamped, unless they are being used. Check your port site every day for signs of infection. Check for: Redness, swelling, or  pain. Fluid or blood. Warmth. Pus or a bad smell. Protect the skin around the port site. Avoid wearing bra straps that rub or irritate the site. Protect the skin around your port from seat belts. Place a soft pad over your chest if needed. Bathe or shower as told by your health care provider. The site may get wet as long as you are not actively receiving an infusion. General instructions  Return to your normal activities as told by your health care provider. Ask your health care provider what activities are safe for you. Carry a medical alert card or wear a medical alert bracelet at all times. This will let health care providers know that you have an implanted port in case of an emergency. Where to find more information American Cancer Society: www.cancer.[place] of Clinical Oncology: www.cancer.net Contact a health care provider if: You have a fever or chills. You have redness, swelling, or pain at the port site. You have fluid or blood coming from your port site. Your incision feels warm to the touch. You have pus or a bad smell coming from the port site. Summary Implanted ports are usually placed in the chest for long-term IV access. Follow instructions from your health care provider about flushing the port and changing bandages (dressings). Take care of the area around your port by avoiding clothing that puts pressure on the area, and by watching for signs of infection. Protect the skin around your port from seat belts. Place a soft pad over your chest if needed. Contact a health care provider if you have a fever or you have redness, swelling, pain, fluid, or a bad smell at the port site. This information is not intended to replace advice given to you by your health care provider. Make sure you discuss any questions you have with your health care provider. Document Revised: 10/31/2020 Document Reviewed: 10/31/2020 Elsevier Patient Education  [place].

## 2021-10-03 ENCOUNTER — Encounter: Payer: Medicare Other | Attending: Psychology | Admitting: Psychology

## 2021-10-03 DIAGNOSIS — F4310 Post-traumatic stress disorder, unspecified: Secondary | ICD-10-CM | POA: Insufficient documentation

## 2021-10-03 DIAGNOSIS — G479 Sleep disorder, unspecified: Secondary | ICD-10-CM | POA: Insufficient documentation

## 2021-10-03 DIAGNOSIS — R29818 Other symptoms and signs involving the nervous system: Secondary | ICD-10-CM | POA: Insufficient documentation

## 2021-10-03 DIAGNOSIS — F331 Major depressive disorder, recurrent, moderate: Secondary | ICD-10-CM | POA: Insufficient documentation

## 2021-10-03 DIAGNOSIS — R4189 Other symptoms and signs involving cognitive functions and awareness: Secondary | ICD-10-CM | POA: Insufficient documentation

## 2021-10-05 ENCOUNTER — Other Ambulatory Visit: Payer: Self-pay | Admitting: Hematology & Oncology

## 2021-10-05 DIAGNOSIS — F4323 Adjustment disorder with mixed anxiety and depressed mood: Secondary | ICD-10-CM

## 2021-10-05 DIAGNOSIS — C801 Malignant (primary) neoplasm, unspecified: Secondary | ICD-10-CM

## 2021-10-10 ENCOUNTER — Inpatient Hospital Stay: Payer: Medicare Other

## 2021-10-30 ENCOUNTER — Other Ambulatory Visit: Payer: Self-pay | Admitting: Hematology & Oncology

## 2021-10-30 ENCOUNTER — Other Ambulatory Visit: Payer: Self-pay | Admitting: Internal Medicine

## 2021-10-30 DIAGNOSIS — C801 Malignant (primary) neoplasm, unspecified: Secondary | ICD-10-CM

## 2021-10-31 ENCOUNTER — Other Ambulatory Visit: Payer: Self-pay | Admitting: Neurology

## 2021-10-31 DIAGNOSIS — H8113 Benign paroxysmal vertigo, bilateral: Secondary | ICD-10-CM

## 2021-10-31 DIAGNOSIS — H53143 Visual discomfort, bilateral: Secondary | ICD-10-CM

## 2021-10-31 DIAGNOSIS — G43109 Migraine with aura, not intractable, without status migrainosus: Secondary | ICD-10-CM

## 2021-10-31 DIAGNOSIS — G4701 Insomnia due to medical condition: Secondary | ICD-10-CM

## 2021-10-31 MED ORDER — DONEPEZIL HCL 10 MG PO TABS: ORAL_TABLET | ORAL | 1 refills | Status: DC

## 2021-11-07 ENCOUNTER — Encounter: Payer: Medicare Other | Attending: Psychology | Admitting: Psychology

## 2021-11-07 DIAGNOSIS — R29818 Other symptoms and signs involving the nervous system: Secondary | ICD-10-CM | POA: Diagnosis not present

## 2021-11-07 DIAGNOSIS — G479 Sleep disorder, unspecified: Secondary | ICD-10-CM | POA: Insufficient documentation

## 2021-11-07 DIAGNOSIS — F331 Major depressive disorder, recurrent, moderate: Secondary | ICD-10-CM | POA: Insufficient documentation

## 2021-11-07 DIAGNOSIS — F4310 Post-traumatic stress disorder, unspecified: Secondary | ICD-10-CM | POA: Diagnosis present

## 2021-11-07 DIAGNOSIS — R4189 Other symptoms and signs involving cognitive functions and awareness: Secondary | ICD-10-CM | POA: Diagnosis present

## 2021-11-08 ENCOUNTER — Other Ambulatory Visit: Payer: Self-pay | Admitting: Hematology & Oncology

## 2021-11-08 ENCOUNTER — Other Ambulatory Visit: Payer: Self-pay | Admitting: Family Medicine

## 2021-11-08 ENCOUNTER — Encounter: Payer: Self-pay | Admitting: Psychology

## 2021-11-08 DIAGNOSIS — F4323 Adjustment disorder with mixed anxiety and depressed mood: Secondary | ICD-10-CM

## 2021-11-08 DIAGNOSIS — C801 Malignant (primary) neoplasm, unspecified: Secondary | ICD-10-CM

## 2021-11-08 MED ORDER — AMPHETAMINE-DEXTROAMPHET ER 20 MG PO CP24
20.0000 mg | ORAL_CAPSULE | Freq: Every day | ORAL | 0 refills | Status: DC
Start: 2021-11-08 — End: 2022-01-15

## 2021-11-08 NOTE — Progress Notes (Signed)
Neuropsychology Visit  Patient:  [name]   DOB: [birth year]  MR Number: [id number]  Location: [facility] [place], [place] [id number] [place] [address] Dept: [phone]  Date of Service: 11/07/2021  Start: 3 PM End: 4 PM  Today's visit was an in person visit that was conducted in my outpatient clinic office.  The patient and myself were present for this visit.   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  The patient has had residual cognitive changes similar to possible impacts of chemotherapy as well as significant residual pain symptoms.  The patient was diagnosed and treated for cancer 10 years ago and continues to have ongoing follow-up regarding possible recurrence.  The patient has had neuropathy, insomnia and memory loss post chemotherapy.  The patient has depressive symptoms that have developed and has a hard time motivating and a hard time keeping focused on life and staying engaged.  The patient reports that she is doing better and her self-care and working on maintaining healthier relationships.  She is broken up with her recent partner but this transpired and managed much easier, but still difficulty, then the previous relationship that ended.  The patient had difficulty with her previous relationship after the break-up as that individual continues to be  manipulative for some time after the break-up.   Treatment Interventions:  Therapeutic interventions associated with chronic pain, residual effects of chemotherapy interventions symptoms associated with PTSD, depression and severe avoidance behaviors.  Participation Level:   Active  Participation Quality:  Appropriate and Attentive      Behavioral Observation:  Well Groomed, Alert, and Appropriate.   Current Psychosocial Factors: The patient reports that she has been doing better overall and some of the relationship issues that were very stressful to her have settled down and she is coping better with these particular issues.  The patient reports that she continues to improve as far as her coping and adjustment issues in her cognition have been improving to some degree.  She continues to struggle with sleep but her sleep has been improved which has helped overall.  Content of Session:   Today, we reviewed the current symptoms and continue to work on therapeutic interventions.  The patient has been actively working on sleep hygiene issues and experiencing improvement to some degree in this and has been doing much better coping with some of the residual cognitive difficulties and depression postchemotherapy.  Effectiveness of Interventions: The patient was very active and open during the visit we had an effective appointment.  Target Goals:   Target goals include working on building coping skills and strategies around issues of depression and residual effects of her chemotherapy including residual cognitive deficits.  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  The patient has had  residual cognitive changes similar to possible impacts of chemotherapy as well as significant residual pain symptoms.  The patient was diagnosed and treated for cancer 10 years ago and continues to have ongoing follow-up regarding possible recurrence.  The patient has had neuropathy, insomnia and memory loss post chemotherapy.  The patient has depressive symptoms that have developed and has a hard time motivating and a hard time keeping focused on life and staying engaged.   The patient describes depressive symptoms, including motivation, feelings of helplessness and hopelessness and anxiety about recurrence of her cancer.  The patient also describes ongoing chronic pain symptoms and neuropathy as well as insomnia and memory loss.  Today we worked specifically on behavioral adaptations and building coping skills around issues of her difficulty completing necessary daily activities and daily requirements as her cognitive change in cognitive impairments and impact they have had on emotional status and self-esteem issues are having a very significant deleterious effect on her.  The patient is continued with symptoms and continues to be unable to work as her cognitive, stress, pain symptoms all leave her unable to perform full-time gainful employment.  The patient has a long history of significant avoidance behaviors particular on completing complex paperwork.  This is part of her overall condition and is not simply the patient not completing aspects that she should.  The patient's avoidance is associated with anxiety and is part of her debilitating symptoms.  The patient has a number of symptoms that have an overlap in their symptom spectrum.  These include residual PTSD-like symptoms, depression, significant pain and possible complex regional pain syndrome symptoms etc.  Reviewing her overall symptoms and status I do think that she may be a very good candidate for ketamine infusion in the future.  I have asked  the patient to speak with her oncologist as these types of treatments are being done through oncology in the [facility] network.  If it is not something that she could get to the [facility] network they are also providing the services through [facility] at the [employer] as well.  This may take some time as getting insurance coverage may require active efforts but we will continue to look at this possibility going forward.  Continued working on on coping with memory issues, depression and PTSD.       Diagnosis:   Neurocognitive deficits  Major depressive disorder, recurrent episode, moderate ([facility])  Post traumatic stress disorder (PTSD)  Sleep disturbance    Ilean Skill, Psy.D. Clinical Psychologist Neuropsychologist

## 2021-11-08 NOTE — Telephone Encounter (Signed)
Pt is asking if a new Rx can be sent to [facility] for her amphetamine-dextroamphetamine (ADDERALL XR) 20 MG 24 hr capsule

## 2021-11-11 ENCOUNTER — Other Ambulatory Visit: Payer: Self-pay | Admitting: Hematology & Oncology

## 2021-11-11 DIAGNOSIS — C801 Malignant (primary) neoplasm, unspecified: Secondary | ICD-10-CM

## 2021-11-20 ENCOUNTER — Ambulatory Visit (INDEPENDENT_AMBULATORY_CARE_PROVIDER_SITE_OTHER): Payer: Medicare Other | Admitting: Internal Medicine

## 2021-11-20 ENCOUNTER — Encounter: Payer: Self-pay | Admitting: Internal Medicine

## 2021-11-20 VITALS — BP 136/82 | HR 94 | Temp 98.1°F | Ht 68.0 in | Wt 204.2 lb

## 2021-11-20 DIAGNOSIS — G620 Drug-induced polyneuropathy: Secondary | ICD-10-CM | POA: Diagnosis not present

## 2021-11-20 DIAGNOSIS — G4701 Insomnia due to medical condition: Secondary | ICD-10-CM | POA: Diagnosis not present

## 2021-11-20 DIAGNOSIS — I10 Essential (primary) hypertension: Secondary | ICD-10-CM

## 2021-11-20 DIAGNOSIS — T451X5A Adverse effect of antineoplastic and immunosuppressive drugs, initial encounter: Secondary | ICD-10-CM

## 2021-11-20 DIAGNOSIS — E785 Hyperlipidemia, unspecified: Secondary | ICD-10-CM | POA: Diagnosis not present

## 2021-11-20 DIAGNOSIS — Z1501 Genetic susceptibility to malignant neoplasm of breast: Secondary | ICD-10-CM

## 2021-11-20 DIAGNOSIS — R29818 Other symptoms and signs involving the nervous system: Secondary | ICD-10-CM

## 2021-11-20 DIAGNOSIS — Z0000 Encounter for general adult medical examination without abnormal findings: Secondary | ICD-10-CM | POA: Diagnosis not present

## 2021-11-20 DIAGNOSIS — K219 Gastro-esophageal reflux disease without esophagitis: Secondary | ICD-10-CM

## 2021-11-20 DIAGNOSIS — R7989 Other specified abnormal findings of blood chemistry: Secondary | ICD-10-CM

## 2021-11-20 DIAGNOSIS — E669 Obesity, unspecified: Secondary | ICD-10-CM

## 2021-11-20 DIAGNOSIS — Z1509 Genetic susceptibility to other malignant neoplasm: Secondary | ICD-10-CM

## 2021-11-20 DIAGNOSIS — R52 Pain, unspecified: Secondary | ICD-10-CM

## 2021-11-20 DIAGNOSIS — R4189 Other symptoms and signs involving cognitive functions and awareness: Secondary | ICD-10-CM

## 2021-11-20 DIAGNOSIS — Z79899 Other long term (current) drug therapy: Secondary | ICD-10-CM

## 2021-11-20 DIAGNOSIS — Z9884 Bariatric surgery status: Secondary | ICD-10-CM | POA: Diagnosis not present

## 2021-11-20 DIAGNOSIS — F17200 Nicotine dependence, unspecified, uncomplicated: Secondary | ICD-10-CM

## 2021-11-20 LAB — CBC WITH DIFFERENTIAL/PLATELET
Basophils Absolute: 0 10*3/uL (ref 0.0–0.1)
Basophils Relative: 0.4 % (ref 0.0–3.0)
Eosinophils Absolute: 0.1 10*3/uL (ref 0.0–0.7)
Eosinophils Relative: 1.7 % (ref 0.0–5.0)
HCT: 44.5 % (ref 36.0–46.0)
Hemoglobin: 14.5 g/dL (ref 12.0–15.0)
Lymphocytes Relative: 23.5 % (ref 12.0–46.0)
Lymphs Abs: 1.5 10*3/uL (ref 0.7–4.0)
MCHC: 32.6 g/dL (ref 30.0–36.0)
MCV: 86.7 fl (ref 78.0–100.0)
Monocytes Absolute: 0.4 10*3/uL (ref 0.1–1.0)
Monocytes Relative: 6 % (ref 3.0–12.0)
Neutro Abs: 4.4 10*3/uL (ref 1.4–7.7)
Neutrophils Relative %: 68.4 % (ref 43.0–77.0)
Platelets: 216 10*3/uL (ref 150.0–400.0)
RBC: 5.13 Mil/uL — ABNORMAL HIGH (ref 3.87–5.11)
RDW: 14.9 % (ref 11.5–15.5)
WBC: 6.4 10*3/uL (ref 4.0–10.5)

## 2021-11-20 LAB — LIPID PANEL
Cholesterol: 183 mg/dL (ref 0–200)
HDL: 51.8 mg/dL (ref >39.00)
LDL Cholesterol: 109 mg/dL — ABNORMAL HIGH (ref 0–99)
NonHDL: 131.58
Total CHOL/HDL Ratio: 4
Triglycerides: 115 mg/dL (ref 0.0–149.0)
VLDL: 23 mg/dL (ref 0.0–40.0)

## 2021-11-20 LAB — TSH: TSH: 2.67 u[IU]/mL (ref 0.35–5.50)

## 2021-11-20 LAB — BASIC METABOLIC PANEL
BUN: 21 mg/dL (ref 6–23)
CO2: 26 mEq/L (ref 19–32)
Calcium: 9.7 mg/dL (ref 8.4–10.5)
Chloride: 104 mEq/L (ref 96–112)
Creatinine, Ser: 0.89 mg/dL (ref 0.40–1.20)
GFR: 70.05 mL/min (ref >60.00)
Glucose, Bld: 108 mg/dL — ABNORMAL HIGH (ref 70–99)
Potassium: 3.6 mEq/L (ref 3.5–5.1)
Sodium: 140 mEq/L (ref 135–145)

## 2021-11-20 LAB — VITAMIN D 25 HYDROXY (VIT D DEFICIENCY, FRACTURES): VITD: 49.15 ng/mL (ref 30.00–100.00)

## 2021-11-20 LAB — HEPATIC FUNCTION PANEL
ALT: 19 U/L (ref 0–35)
AST: 21 U/L (ref 0–37)
Albumin: 4.2 g/dL (ref 3.5–5.2)
Alkaline Phosphatase: 105 U/L (ref 39–117)
Bilirubin, Direct: 0.1 mg/dL (ref 0.0–0.3)
Total Bilirubin: 0.4 mg/dL (ref 0.2–1.2)
Total Protein: 6.8 g/dL (ref 6.0–8.3)

## 2021-11-20 LAB — VITAMIN B12: Vitamin B-12: 840 pg/mL (ref 211–911)

## 2021-11-20 LAB — HEMOGLOBIN A1C: Hgb A1c MFr Bld: 6.4 % (ref 4.6–6.5)

## 2021-11-20 MED ORDER — OZEMPIC (0.25 OR 0.5 MG/DOSE) 2 MG/3ML ~~LOC~~ SOPN: 0.2500 mg | PEN_INJECTOR | SUBCUTANEOUS | 1 refills | Status: DC

## 2021-11-20 MED ORDER — ORPHENADRINE CITRATE ER 100 MG PO TB12: 100.0000 mg | ORAL_TABLET | Freq: Two times a day (BID) | ORAL | 0 refills | Status: DC | PRN

## 2021-11-20 MED ORDER — AMLODIPINE BESYLATE 10 MG PO TABS: 10.0000 mg | ORAL_TABLET | Freq: Every day | ORAL | 1 refills | Status: DC

## 2021-11-20 NOTE — Patient Instructions (Addendum)
Good to see you today   We can order the muscle relaxant to try again as indicated  with caution.  . Lab today  We can order ozempic  to begin .    As a trial  Increase amlodipine to 10 mg ( [facility])  Update  in a month  about how doing and then decide on dosing.

## 2021-11-20 NOTE — Progress Notes (Signed)
Chief Complaint  Patient presents with   Annual Exam    Not fasting     HPI: Patient  [name]  61 y.o. comes in today for Preventive Health Care visit  And   medical  disease management.    BP: amlodipine sprionolactone but still tending to run high in the 140s occasionally diastolic is 90 versus 80  Ms: norflex had helped her pain .  She is aware of my reluctance to do routine refills however with history of past    rotator cuff.  Shoulder hurt  on going.  Her musculoskeletal pain aggravates   insomnia .  Is trying to decrease her pill count now down to 5 mg  zolpidem .   Asleep  around 2 and some sleep.   Adjustable bed  has helped  the reflux Injections  in joints .  She thinks would not be necessary if she were on the Norflex.  It does cost her some money and a 90-day prescription is much less expensive because she has to pay out-of-pocket she continues on her Lyrica for no neuropathy pain.  Feels that muscle relaxant  would help sleep and pain as it has in the past. HLD :crestor list says 5 mg twice a day but she is only taking 1 pill a day.  Psych :memory  ptsd depression sleep   candidate for ketamine?  But cannot find providers .  That would provide this. Adderall per neurology has been helpful in the day to stay on task and get things done otherwise she would be drowsy 2.    She tends to eat at 6:00 at night because is not hungry in the morning her circadian rhythm is very shifted.  She is trying to work on this.  Is working on her weight is interested in [employer] she is prediabetic perhaps would help her evening eating.  History of bariatric surgery. 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   Adjustment disorder    Allergic rhinitis    Allergy    Alopecia    Anxiety and depression    Arthritis    Blood transfusion without reported diagnosis    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   Clotting disorder ([facility]    GERD (gastroesophageal reflux disease)    HLD (hyperlipidemia)    Hx pulmonary embolism    Hypertension    Iron deficiency anemia due to chronic blood loss 11/04/2016   Metrorrhagia    Neuromuscular disorder (HCC)    Neuropathy    Obesity    Ovarian cancer ([facility] 04/2008   ChemoTherapy   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   Raynaud's syndrome    Right groin mass    Squamous cell carcinoma of skin    Ulcerative colitis ([facility]    Vision abnormalities  Vitamin D deficiency     Past Surgical History:  Procedure Laterality Date   ABDOMINAL HYSTERECTOMY  05/2008   TAH/BSO   AUGMENTATION MAMMAPLASTY Bilateral 10+ years   BREAST BIOPSY Left 2020   BREAST BIOPSY Right    BREAST REDUCTION SURGERY  2000   CESAREAN SECTION     1991   COLONOSCOPY  2004; 12/07/10   hemorrhoids   ESOPHAGOGASTRODUODENOSCOPY     GASTRIC BYPASS  1979   REDUCTION MAMMAPLASTY Bilateral    TOENAIL EXCISION Bilateral    TONSILLECTOMY      Family History  Problem Relation Age of Onset   Pulmonary embolism Mother    Hypertension Father    Breast cancer Maternal Grandmother        diagnosed in early 44s   Hyperlipidemia Other    Breast cancer Sister 61   Colon cancer Neg Hx    Esophageal cancer Neg Hx    Rectal cancer Neg Hx    Stomach cancer Neg Hx     Social History   Socioeconomic History   Marital status: Divorced    Spouse name: Not on file   Number  of children: 1   Years of education: Master's   Highest education level: Not on file  Occupational History    Employer: UNEMPLOYED    Comment: disabled  Tobacco Use   Smoking status: Every Day    Packs/day: 1.00    Years: 37.00    Total pack years: 37.00    Types: Cigarettes    Start date: 06/16/1978   Smokeless tobacco: Never   Tobacco comments:    2- 7-16  STILL SMOKING  Vaping Use   Vaping Use: Never used  Substance and Sexual Activity   Alcohol use: Yes    Alcohol/week: 0.0 standard drinks of alcohol    Comment: rare, 3 weekly   Drug use: No   Sexual activity: Not on file  Other Topics Concern   Not on file  Social History Narrative   Divorced   1 son /1 adopted adult son  2 dogs    Sales occupation on disability for now   Regular exercise-yes money stress   Daily caffeine use 2-3 and 2 cups of coffee   On since a security disability since 2012   Patient is right-handed.   Patient has a Master's degree.   Social Determinants of Health   Financial Resource Strain: Not on file  Food Insecurity: Not on file  Transportation Needs: Not on file  Physical Activity: Not on file  Stress: Stress Concern Present (05/10/2021)   [place]    Feeling of Stress : Very much  Social Connections: Not on file    Outpatient Medications Prior to Visit  Medication Sig Dispense Refill   amphetamine-dextroamphetamine (ADDERALL XR) 20 MG 24 hr capsule Take 1 capsule (20 mg total) by mouth daily. 30 capsule 0   aspirin 81 MG tablet Take 81 mg by mouth daily. 2 TABS IN AM     butalbital-acetaminophen-caffeine (FIORICET) 50-325-40 MG tablet Take 1 tablet by mouth every 6 (six) hours as needed for headache (migraine abortion). TAKE 1 TABLET BY MOUTH EVERY 6 HOURS AS NEEDED FOR HEADACHE further refills should come from Neurology team . 10 tablet 5   calcium carbonate (TUMS - DOSED IN MG ELEMENTAL CALCIUM) 500 MG chewable  tablet Chew 1 tablet by mouth daily as needed for indigestion or heartburn.     diclofenac (VOLTAREN)  75 MG EC tablet TK 1 T PO BID AFTER MEALS FOR INFLAMMATION / PAIN / SWELLING PRN ONLY  1   donepezil (ARICEPT) 10 MG tablet TAKE 1 TABLET(10 MG) BY MOUTH EVERY MORNING 90 tablet 1   DULoxetine (CYMBALTA) 60 MG capsule TAKE 1 CAPSULE(60 MG) BY MOUTH DAILY 90 capsule 0   Multiple Vitamin (MULTIVITAMIN WITH MINERALS) TABS tablet Take 1 tablet by mouth daily.     rosuvastatin (CRESTOR) 5 MG tablet Take 2 tablets (10 mg total) by mouth daily. 90 tablet 1   spironolactone (ALDACTONE) 25 MG tablet TAKE 1 TABLET(25 MG) BY MOUTH DAILY 90 tablet 1   Vitamin D, Ergocalciferol, (DRISDOL) 1.25 MG (50000 UNIT) CAPS capsule TAKE 1 CAPSULE BY MOUTH 1 TIME A WEEK 13 capsule 2   zolpidem (AMBIEN) 5 MG tablet TAKE 1 TO 2 TABLETS(5 TO 10 MG) BY MOUTH EVERY NIGHT AT BEDTIME AS NEEDED 180 tablet 0   amLODipine (NORVASC) 5 MG tablet TAKE 1 TABLET(5 MG) BY MOUTH DAILY 90 tablet 1   orphenadrine (NORFLEX) 100 MG tablet Take 1 tablet (100 mg total) by mouth 2 (two) times daily as needed for muscle spasms. Schedule follow up visit for more refills. [phone] 60 tablet 0   pregabalin (LYRICA) 200 MG capsule TAKE 1 CAPSULE(200 MG) BY MOUTH TWICE DAILY 60 capsule 0   No facility-administered medications prior to visit.     EXAM:  BP 136/82 (BP Location: Left Arm)   Pulse 94   Temp 98.1 F (36.7 C) (Oral)   Ht 5' 8"  (1.727 m)   Wt 204 lb 3.2 oz (92.6 kg)   SpO2 99%   BMI 31.05 kg/m   Body mass index is 31.05 kg/m. Wt Readings from Last 3 Encounters:  11/27/21 203 lb (92.1 kg)  11/20/21 204 lb 3.2 oz (92.6 kg)  08/28/21 210 lb 3.2 oz (95.3 kg)    Physical Exam: Vital signs reviewed ZJI:RCVE is a well-developed well-nourished alert cooperative    who appearsr stated age in no acute distress.  HEENT: normocephalic atraumatic , Eyes: PERRL EOM's full, conjunctiva clear, Nares: paten,t no deformity discharge  or tenderness., Ears: no deformity EAC's clear TMs with normal landmarks. NECK: supple without masses, thyromegaly or bruits. CHEST/PULM:  Clear to auscultation and percussion breath sounds equal no wheeze , rales or rhonchi. No chest wall deformities or tenderness. Breast: normal by inspection . No dimpling, discharge, masses, tenderness or discharge .healed scars  CV: PMI is nondisplaced, S1 S2 no gallops, murmurs, rubs. Peripheral pulses are full without delay  ABDOMEN: Bowel sounds normal nontender  No guard or rebound, no hepato splenomegal no CVA tenderness.   No stria  Extremtities:  No clubbing cyanosis or edema, no acute joint swelling or redness no focal atrophy NEURO:  Oriented x3, cranial nerves 3-12 appear to be intact, no obvious focal weakness,gait within normal limits  sensation not tested  SKIN: No acute rashes normal turgor, color, no bruising or petechiae. PSYCH: Oriented, good eye contact, no obvious  acute depression anxiety, nl speech  LN: no cervical axillary inguinal adenopathy  Lab Results  Component Value Date   WBC 6.4 11/20/2021   HGB 14.5 11/20/2021   HCT 44.5 11/20/2021   PLT 216.0 11/20/2021   GLUCOSE 108 (H) 11/20/2021   CHOL 183 11/20/2021   TRIG 115.0 11/20/2021   HDL 51.80 11/20/2021   LDLDIRECT 98.7 09/04/2009   LDLCALC 109 (H) 11/20/2021   ALT 19 11/20/2021   AST 21 11/20/2021   NA  140 11/20/2021   K 3.6 11/20/2021   CL 104 11/20/2021   CREATININE 0.89 11/20/2021   BUN 21 11/20/2021   CO2 26 11/20/2021   TSH 2.67 11/20/2021   INR 0.86 06/02/2013   HGBA1C 6.4 11/20/2021    BP Readings from Last 3 Encounters:  12/04/21 (!) 132/92  11/27/21 132/90  11/20/21 136/82    Lab plan  reviewed with patient  update all today inc b12 and vit d  ASSESSMENT AND PLAN:  Discussed the following assessment and plan:    ICD-10-CM   1. Visit for preventive health examination  Z00.00     2. Essential hypertension  I10 Lipid panel    Basic metabolic  panel    CBC with Differential/Platelet    Hemoglobin A1c    Hepatic function panel    TSH    VITAMIN D 25 Hydroxy (Vit-D Deficiency, Fractures)    Vitamin B12    TSH    Hepatic function panel    Hemoglobin A1c    CBC with Differential/Platelet    Basic metabolic panel    Lipid panel    Vitamin B12    VITAMIN D 25 Hydroxy (Vit-D Deficiency, Fractures)   intensify med    3. Pain  R52 Lipid panel    Basic metabolic panel    CBC with Differential/Platelet    Hemoglobin A1c    Hepatic function panel    TSH    VITAMIN D 25 Hydroxy (Vit-D Deficiency, Fractures)    Vitamin B12    TSH    Hepatic function panel    Hemoglobin A1c    CBC with Differential/Platelet    Basic metabolic panel    Lipid panel    Vitamin B12    VITAMIN D 25 Hydroxy (Vit-D Deficiency, Fractures)   pat says norflex helps pain and sleep more than many other trials    4. Insomnia due to medical condition  G47.01 Lipid panel    Basic metabolic panel    CBC with Differential/Platelet    Hemoglobin A1c    Hepatic function panel    TSH    VITAMIN D 25 Hydroxy (Vit-D Deficiency, Fractures)    Vitamin B12    TSH    Hepatic function panel    Hemoglobin A1c    CBC with Differential/Platelet    Basic metabolic panel    Lipid panel    Vitamin B12    VITAMIN D 25 Hydroxy (Vit-D Deficiency, Fractures)    5. Medication management  Z79.899 Lipid panel    Basic metabolic panel    CBC with Differential/Platelet    Hemoglobin A1c    Hepatic function panel    TSH    VITAMIN D 25 Hydroxy (Vit-D Deficiency, Fractures)    Vitamin B12    TSH    Hepatic function panel    Hemoglobin A1c    CBC with Differential/Platelet    Basic metabolic panel    Lipid panel    Vitamin B12    VITAMIN D 25 Hydroxy (Vit-D Deficiency, Fractures)    6. Neurocognitive deficits  R29.818    R41.89     7. 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 Bariatric surgery status  Z98.84 Lipid panel    Basic metabolic panel    CBC with Differential/Platelet    Hemoglobin A1c    Hepatic function panel    TSH    VITAMIN D 25 Hydroxy (Vit-D Deficiency, Fractures)    Vitamin B12    TSH    Hepatic function panel    Hemoglobin A1c    CBC with Differential/Platelet    Basic metabolic panel    Lipid panel    Vitamin B12    VITAMIN D 25 Hydroxy (Vit-D Deficiency, Fractures)    10. Hyperlipidemia, unspecified hyperlipidemia type  E78.5    only taking crestor 5  at this time    11. TOBACCO USE  F17.200     12. Gastroesophageal reflux disease, unspecified whether esophagitis present  K21.9    inproved with weight loss and head of bed elevation    13. Abnormal thyroid stimulating hormone (TSH) level  R79.89 Lipid panel    Basic metabolic panel    CBC with Differential/Platelet    Hemoglobin A1c    Hepatic function panel    TSH    VITAMIN D 25 Hydroxy (Vit-D Deficiency, Fractures)    Vitamin B12    TSH    Hepatic function panel    Hemoglobin A1c    CBC with Differential/Platelet    Basic metabolic panel    Lipid panel    Vitamin B12    VITAMIN D 25 Hydroxy (Vit-D Deficiency,  Fractures)    14. Obesity (BMI 30-39.9)  E66.9     Multiple issues addressed today as well as medications. We will refill her Norflex with caution as she states this is the best thing for her pain and may minimize other problems We discussed her wake sleep and eating cycle. Ozempic initial ordered if approved by insurance for her prediabetes lab today.  She may benefit. Intensifying antihypertensive medicine increasing amlodipine to 10 mg continue healthy weight loss. In regard to mood and neurocognitive deficits PTSD we will continue to look for services that treat. She is aware to stop all tobacco.  Return for lab today , update after  4 weeks o  ozempic  , ROV in 3 months bp and meds .  Patient Care Team: Librado Guandique, Standley Brooking, MD as PCP - General (Internal Medicine) Gatha Mayer, MD (Gastroenterology) Yaakov Guthrie, PsyD (Inactive) (Psychiatry) [name], MD as Attending Physician (Internal Medicine) Dohmeier, Asencion Partridge, MD (Neurology) Normajean Glasgow, MD as Attending Physician (Physical Medicine and Rehabilitation) Eli Hose, [facility] (Inactive) as Pharmacist (Pharmacist) Patient Instructions  Good to see you today   We can order the muscle relaxant to try again as  indicated  with caution.  . Lab today  We can order ozempic  to begin .    As a trial  Increase amlodipine to 10 mg ( [facility])  Update  in a month  about how doing and then decide on dosing.    Standley Brooking. Dwanda Tufano M.D.

## 2021-11-24 ENCOUNTER — Other Ambulatory Visit: Payer: Self-pay | Admitting: Hematology & Oncology

## 2021-11-24 DIAGNOSIS — G63 Polyneuropathy in diseases classified elsewhere: Secondary | ICD-10-CM

## 2021-11-24 DIAGNOSIS — F4323 Adjustment disorder with mixed anxiety and depressed mood: Secondary | ICD-10-CM

## 2021-11-26 ENCOUNTER — Encounter: Payer: Self-pay | Admitting: Hematology & Oncology

## 2021-11-27 ENCOUNTER — Ambulatory Visit: Payer: Medicare Other | Admitting: Family Medicine

## 2021-11-27 ENCOUNTER — Encounter: Payer: Self-pay | Admitting: Family Medicine

## 2021-11-27 VITALS — BP 132/90 | HR 100 | Ht 68.0 in | Wt 203.0 lb

## 2021-11-27 DIAGNOSIS — F419 Anxiety disorder, unspecified: Secondary | ICD-10-CM | POA: Diagnosis not present

## 2021-11-27 DIAGNOSIS — R29818 Other symptoms and signs involving the nervous system: Secondary | ICD-10-CM | POA: Diagnosis not present

## 2021-11-27 DIAGNOSIS — G4701 Insomnia due to medical condition: Secondary | ICD-10-CM | POA: Diagnosis not present

## 2021-11-27 DIAGNOSIS — F988 Other specified behavioral and emotional disorders with onset usually occurring in childhood and adolescence: Secondary | ICD-10-CM

## 2021-11-27 DIAGNOSIS — G43109 Migraine with aura, not intractable, without status migrainosus: Secondary | ICD-10-CM

## 2021-11-27 DIAGNOSIS — R4189 Other symptoms and signs involving cognitive functions and awareness: Secondary | ICD-10-CM

## 2021-11-27 DIAGNOSIS — Z0289 Encounter for other administrative examinations: Secondary | ICD-10-CM

## 2021-11-27 DIAGNOSIS — F32A Depression, unspecified: Secondary | ICD-10-CM | POA: Diagnosis not present

## 2021-11-27 DIAGNOSIS — G3184 Mild cognitive impairment, so stated: Secondary | ICD-10-CM | POA: Diagnosis not present

## 2021-11-27 NOTE — Patient Instructions (Signed)
Below is our plan:  We will continue current treatment plan. I am going to refer you to a psychiatrist for concerns of anxiety and depression.   Please make sure you are staying well hydrated. I recommend 50-60 ounces daily. Well balanced diet and regular exercise encouraged. Consistent sleep schedule with 6-8 hours recommended.   Please continue follow up with care team as directed.   Follow up with Dr Brett Fairy in 4-6 months   You may receive a survey regarding today's visit. I encourage you to leave honest feed back as I do use this information to improve patient care. Thank you for seeing me today!   Management of Memory Problems   There are some general things you can do to help manage your memory problems.  Your memory may not in fact recover, but by using techniques and strategies you will be able to manage your memory difficulties better.   1)  Establish a routine. Try to establish and then stick to a regular routine.  By doing this, you will get used to what to expect and you will reduce the need to rely on your memory.  Also, try to do things at the same time of day, such as taking your medication or checking your calendar first thing in the morning. Think about think that you can do as a part of a regular routine and make a list.  Then enter them into a daily planner to remind you.  This will help you establish a routine.   2)  Organize your environment. Organize your environment so that it is uncluttered.  Decrease visual stimulation.  Place everyday items such as keys or cell phone in the same place every day (ie.  Basket next to front door) Use post it notes with a brief message to yourself (ie. Turn off light, lock the door) Use labels to indicate where things go (ie. Which cupboards are for food, dishes, etc.) Keep a notepad and pen by the telephone to take messages   3)  Memory Aids A diary or journal/notebook/daily planner Making a list (shopping list, chore list, to do  list that needs to be done) Using an alarm as a reminder (kitchen timer or cell phone alarm) Using cell phone to store information (Notes, Calendar, Reminders) Calendar/White board placed in a prominent position Post-it notes   In order for memory aids to be useful, you need to have good habits.  It's no good remembering to make a note in your journal if you don't remember to look in it.  Try setting aside a certain time of day to look in journal.   4)  Improving mood and managing fatigue. There may be other factors that contribute to memory difficulties.  Factors, such as anxiety, depression and tiredness can affect memory. Regular gentle exercise can help improve your mood and give you more energy. Simple relaxation techniques may help relieve symptoms of anxiety Try to get back to completing activities or hobbies you enjoyed doing in the past. Learn to pace yourself through activities to decrease fatigue. Find out about some local support groups where you can share experiences with others. Try and achieve 7-8 hours of sleep at night.

## 2021-11-27 NOTE — Progress Notes (Signed)
PATIENT: [name] DOB: 1960-11-11  REASON FOR VISIT: follow up HISTORY FROM: patient  Chief Complaint  Patient presents with   Follow-up    Rm 1, alone. Here for 6 month memory f/u. No changes since last OV. Still struggling w sleep. Having issues with retention. MOCA: 22     HISTORY OF PRESENT ILLNESS:  11/27/2021 ALL: [name] returns for follow up for migraines, ADD, memory loss and insomnia. She continues Adderall XR 59m QD, donepezil 167mQHS, Ambien 5-102mHS and Fioricet PRN.   She reports symptoms are about the same. She continues to have a hard time getting to sleep. She is taking 5mg46m Ambien most days. She doesn't feel that 10mg5mks any better and has a negative side effect. Once asleep, she gets 7-8 hours of sleep a night. She may wake up 1-2 times a night. She is able to go back to sleep within 15 minutes.   She reports having significant anxiety. She has a box full of mail from the past 4 months that she will not look at because she is scared she will have a panic attack. She is seeing Dr RodenSima Mataslarly. He has recommended ketamine treatments for depression but she can not find a provider who will treat her. She denies SI.   Memory is about the same. She has a hard time retaining information. She has difficulty learning new information. She can't read any longer. She does feel that Adderall helps with inattention. She takes this most days but avoids if she can not take early in the day.   She reports headaches are well managed. She had a bad migraine last week. She averages about 1 migraine a month. Fioricet is very helpful.   She is having more difficulty with generalized joint pain with muscle pain. She is taking Norflex 100mg 31m She reports that PCP is not comfortable filling medication. She was asked to talk with us [place] filling this for her.   05/29/2021 ALL: [name] for follow up for migraines, ADD, memory loss and insomnia. She continues  Adderall XR 20mg Q57monepezil 10mg QH24mmbien 5-10mg QHS4m Fioricet PRN. She feels that attention deficit is much better on Adderall. She does not feel as scattered. She feels it helps with getting started with tasks that need to be completed.   Headaches seem well manage. She has 1-2 migraines on average per month. Fioricet aborts migraine.   She seems to be sleeping okay. She feels that she gets plenty of sleep, usually about 8 hours of sleep every night. She usually takes 5mg of Am48mn. Sometimes uses Delta 9 if Ambien does not work.   Neuropathy is well managed on pregabalin managed by PCP. She has good days and bad. Occasionally unsteady.   Memory is about the same. She continues to have word finding difficulty and short term memory loss. She is playing brain games on her phone and uses notes to help her remember important things. She is not exercising as much as she was. She is seeing Dr RodenboughSima Matasmended consideration of ketamine infusions. She is not sure oncology will perform these treatments.   11/07/2020 ALL:  She returns for follow up for migraines, inattention, cognitive impairments and insomnia. At last visit with Dr Dohmeier, Brett Fairyshe added Adderall for concerns of inattention and fatigue. She has continued Adderall XR 20mg daily52me does feel it has helped significantly with daytime fatigue. She has not taken in the past month  due to misplacing her medication. Last refill was 09/19/2020. She feels that she is sleeping better when taking Adderall as she is not sleeping during the day.   She has continued Ambien 5-48m at bedtime for insomnia and feels it works well. She is sleeping fairly well. Migraines are well managed. She usually has 4-5 migraines days per month. This month she has only had one migraine day. Fioricet works well for abortive therapy. Nurtec not covered with her insurance.   She continues Aricept 161mand tolerating well. Follow up neurocognitive eval  with Dr RoBrita Rompn 2021 was unremarkable with exception of depression. Dr Dohmeier recommended CBT at last visit. She has not seen anyone. She feels that she is doing okay. She does often cancel plans and prefers to be alone. She continues duloxetine 6030maily written by oncology following worsening depression after starting Lyrica for neuropathy (managed by PCP). Neuropathy is worse at night, not bothersome during the day when she is occupied.    02/09/2020 CD: RN notes-rm 10. presents for follow up visit. pt states that he has had a pinching sensation in back area which started 2 weeks ago and its intermittent. she has been seeing Dr RodSima Matasd feels that has been helping. pt said she had some skin concerns for  which she is seeing specialist. She developed a dark spot in her left eye saw her ophthalmologist, a laser procedure is planned for 10/4.   CD 02-09-2020. I have the pleasure meeting again today again [name] , a multiracial femele patient, who was last seen by our nurse practitioner Skippy Marhefka.  The patient underwent neuropsychological testing and the last date of service had been 9-21 2021.  This was a follow-up with Dr. JohIlean Skillom she had seen a year earlier for baseline neuropsychological evaluation.  She has ongoing adjustment issues, cognitive changes following her treatment for cancer.  She had some residual cognitive changes that were attributed to chemotherapy effects.  She also has a neuropathy that is likely related to either the primary malignancy or the treatment thereof.  Dr. RodJefm MileshD,  discussed with her to work on therapeutic interventions along coping skills/strategies with regards to depression and to better cope with the residual effects of chemotherapy- however , he felt that this was a moderate recurrent episode of a major depression and did not there was no retesting of her cognitive function.  The diagnosis of neurocognitive deficits had been  made a year earlier. She has been reporting ongoing hypersomnia- Epworth sleepiness score 15/ 14 points, she has better sleep quality at night. Bouts of insomnia but much less long and frequent.  She attributed this to Dr RodGaspar Colahavior therapy, improving also anxiety and depression. Depression manifesting I feeling overwhelmed, feeling paralyzed by the overwhelmed feeling. " get no- thing done ". Continues to take antidepressant medication. Lack of energy: " tired every day and then not sleeping at night "   08/05/2019 ALL: [name] a 61 38o. female here today for follow up. She is seeing psychology. She feels that it helps. Sleep is stable. She is trying to only take 1 tablet of Ambien versus two. She continues Aricept 30m74mhe feels that memory waxes and wanes. She lives alone. She is able to perform all ADL's independently. She is able to drive. She makes "wrong turns" frequently but is able to get to her destination without difficulty. She declines MOCA today stating "that is not my problem." She feels  that chemo has affected her ability to stay focused. She uses written instructions for making coffee each morning. She uses sticky notes. She gets overwhelmed with paying bills so she has hired someone to help her with this. She continues Lyrica 253m daily and feels this is very helpful. Lyrica is prescribed by oncology. She continues to see oncology every 3-4 months.   HISTORY: (copied from Dr Dohmeier's note on 11/25/2018)  I have the pleasure of seeing [name] D. SMillhousenagain, a 61year old woman of african and caucasian mixed race who reports less trouble sleeping but trouble with memory, progressive.  She has neuropathy, had migraine, is at high risk of falling. 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 She has had spells that were syncopal, and some looking like seizures.  She reports feeling isolated during the corona pandemic,  has seen neither children , grandchild, nor  her parents.she is tearful , appears very depressed. She takes Cymbalta for mood.      2016 : [name] is a 61year old female with a history of neuropathy,cognitive changes and insomnia. She returns today for follow-up. The patient continues to take Ambien for insomnia. The patient states that if she does not take this medication she will not be able to go to sleep. The patient currently takes Lyrica 150 mg twice a day for neuropathy. Patient states that the burning and tingling is located in the toes extending to the mid foot bilaterally. She also has burning and tingling in the fingers that extends to the mid hand. Denies any changes with her gait or balance. That she had a fall while standing and watching fireworks. She states that the right leg went numb and cause her to fall. She states that this is the first time this has happened. She states that the numbness resolved. He states that she is not had any additional episodes of numbness. Patient feels that her memory has remained same. She states that she is able to do simple tasks but things that require more complex thinking she has difficulty with. For example she has trouble following plots in a TV show. She states that she has directions for how to make coffee posted. She is able to complete all ADLs independently. She operates a mTeacher, musicwithout difficulty. She states that occasionally she'll take a wrong turn to a familiar place. She continues to take Aricept 10 mg daily. Patient states that she did have a sleep study area and she is unsure if she should be wearing oxygen at bedtime. She states that she had a dental procedure and the technician noted that her oxygen saturation dropped while she was sleeping. She denies any new neurological issues. She returns today for an evaluation.  HISTORY 05/18/14 (CD): [name] is here today for her yearly revisit she is doing remarkably well knowing  about her history of metastatic cancer with unknown primary to more she seems to have battled her disease very well.   She has lost some weight since her last visit,  her blood pressure is well controlled.  The HST study performed on 10-23-13 showed obstructive sleep apnea not to be present,  but some hypoxemia at night there were 109 minutes of oxygenation at 88% or less.  Since this test was a home sleep test there is a higher likelihood of artifact. The patient does  1-2 times a night  feel air hungry. She does wake occasionally up with a  feeling that she doesn't  breath enough. And her heart will be racing-  palpitations have also not been seen in her home sleep test. She described sudden falls still being a concern, neuropathy contributing to this.    her cognition is improved, by her own subjective assessment as well as  MOCA test today : scored 26 out of 30 points. This is a normal range for her cognitive function.  Ambien dependent , unable to go to sleep without sleep aid for several years now.   Her list of medication was reviewed and there have been no recent changes. Her dog is her main social contact , besides her church family.    Interval history from 11/28/2015. [name] is here today and did excellent on today's memory testing. There is a variability between 26 and 22 points over the last 18 months. She mastered the trail making test, she recalled 3 out of 5 recall words. The patient had been advised in my last visit with her to avoid driving at night, she lives alone and she relies on her own transportation. She has not gotten lost. She has  an exercise regimen and she has implemented dietary changes. She is an excellent mood today and would like to reduce her Ambien from 12 point 5 at night to 10 mg which makes me very happy. I would like for her to continue with the Aricept I will follow her every 6 months.   Interval history from 06/05/2016, [name] at the meanwhile  61 year old woman with a long-standing history for ill-defined consult. She can continue to drive without difficulties and is independent in activities of daily living she reports rhinitis but I attributed to Aricept, falls that seem to be neither syncopal, nor seizure, no mechanical in origin. Her falls are always propulsive, she falls forward she has very little warning or numb and she finds herself dominant how she got to the floor. Her mini entall test score 25 out of 30 points today which is stable and actually improved to a year ago. She is taking Lyrica for neuropathy and Lyrica is also a seizure medicine, I have wondered if Aricept may give her bradycardia but her heart rate today was 92.   Interval history from the tenth of January 2019, I have the pleasure of meeting today with [name], I follow her yearly and we usually look at her degree of fatigue, gait stability and cognitive status.  She scored today on a [place] cognitive assessment 23 out of 30 points and was last year 25 out of 30 points.  She missed 2 of the 5 recall words.  And the day of the week. She had another fall in October 2018 which she considered bad.  She describes that her legs entangled for some reason she fell onto her right hip she did not suffer fractures just a hematoma.  This happened on a non-slip even floor and she is still not sure what happened. No loss of conscience. Another fall at her sons house on the day before New years. "I sometimes cannot feel my feet and they end up entangled or in the wrong place at the wrong time.  This time I fell and slipped over a coffee table.   05-26-2018.RV , yearly for disability forms.  Mrs. Zebedee Iba- Freddi Che has become a grandmother 31 month ago and is very happy, son and his family live in [place]. She has survived an unknown primary cancer by now for many years. Still  having neuropathy - as expected, and memory problems.   REVIEW OF SYSTEMS: Out of a complete 14 system  review of symptoms, the patient complains only of the following symptoms, insomnia, depression, memory loss, neuropathy, joint pain, and all other reviewed systems are negative.   ALLERGIES: Allergies  Allergen Reactions   Codeine Nausea Only   Orange Itching and Rash    HOME MEDICATIONS: Outpatient Medications Prior to Visit  Medication Sig Dispense Refill   amLODipine (NORVASC) 10 MG tablet Take 1 tablet (10 mg total) by mouth daily. 90 tablet 1   amphetamine-dextroamphetamine (ADDERALL XR) 20 MG 24 hr capsule Take 1 capsule (20 mg total) by mouth daily. 30 capsule 0   aspirin 81 MG tablet Take 81 mg by mouth daily. 2 TABS IN AM     butalbital-acetaminophen-caffeine (FIORICET) 50-325-40 MG tablet Take 1 tablet by mouth every 6 (six) hours as needed for headache (migraine abortion). TAKE 1 TABLET BY MOUTH EVERY 6 HOURS AS NEEDED FOR HEADACHE further refills should come from Neurology team . 10 tablet 5   calcium carbonate (TUMS - DOSED IN MG ELEMENTAL CALCIUM) 500 MG chewable tablet Chew 1 tablet by mouth daily as needed for indigestion or heartburn.     diclofenac (VOLTAREN) 75 MG EC tablet TK 1 T PO BID AFTER MEALS FOR INFLAMMATION / PAIN / SWELLING PRN ONLY  1   donepezil (ARICEPT) 10 MG tablet TAKE 1 TABLET(10 MG) BY MOUTH EVERY MORNING 90 tablet 1   DULoxetine (CYMBALTA) 60 MG capsule TAKE 1 CAPSULE(60 MG) BY MOUTH DAILY 90 capsule 0   Multiple Vitamin (MULTIVITAMIN WITH MINERALS) TABS tablet Take 1 tablet by mouth daily.     orphenadrine (NORFLEX) 100 MG tablet Take 1 tablet (100 mg total) by mouth 2 (two) times daily as needed for muscle spasms. 180 tablet 0   pregabalin (LYRICA) 200 MG capsule TAKE 1 CAPSULE(200 MG) BY MOUTH TWICE DAILY 60 capsule 3   rosuvastatin (CRESTOR) 5 MG tablet Take 2 tablets (10 mg total) by mouth daily. 90 tablet 1   Semaglutide,0.25 or 0.5MG/DOS, (OZEMPIC, 0.25 OR 0.5 MG/DOSE,) 2 MG/3ML SOPN Inject 0.25 mg into the skin once a week. 3 mL 1    spironolactone (ALDACTONE) 25 MG tablet TAKE 1 TABLET(25 MG) BY MOUTH DAILY 90 tablet 1   Vitamin D, Ergocalciferol, (DRISDOL) 1.25 MG (50000 UNIT) CAPS capsule TAKE 1 CAPSULE BY MOUTH 1 TIME A WEEK 13 capsule 2   zolpidem (AMBIEN) 5 MG tablet TAKE 1 TO 2 TABLETS(5 TO 10 MG) BY MOUTH EVERY NIGHT AT BEDTIME AS NEEDED 180 tablet 0   No facility-administered medications prior to visit.    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   Clotting disorder ([place])    GERD (gastroesophageal reflux disease)    HLD (hyperlipidemia)    Hx pulmonary embolism    Hypertension    Iron deficiency anemia due to chronic blood loss 11/04/2016   Metrorrhagia    Neuromuscular disorder ([facility])    Neuropathy    Obesity    Ovarian cancer ([place]) 04/2008   ChemoTherapy   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   Raynaud's syndrome    Right  groin mass    Squamous cell carcinoma of skin    Ulcerative colitis ([place])    Vision abnormalities    Vitamin D deficiency     PAST SURGICAL HISTORY: Past Surgical History:  Procedure Laterality Date   ABDOMINAL HYSTERECTOMY  05/2008   TAH/BSO   AUGMENTATION MAMMAPLASTY Bilateral 10+ years   BREAST BIOPSY Left 2020   BREAST BIOPSY Right    BREAST REDUCTION SURGERY  2000   CESAREAN SECTION     1991   COLONOSCOPY  2004; 12/07/10   hemorrhoids   ESOPHAGOGASTRODUODENOSCOPY     GASTRIC BYPASS  1979   REDUCTION MAMMAPLASTY Bilateral    TOENAIL EXCISION Bilateral    TONSILLECTOMY      FAMILY HISTORY: Family History  Problem Relation Age of Onset   Pulmonary embolism Mother    Hypertension Father    Breast cancer Maternal Grandmother        diagnosed in early 22s   Hyperlipidemia Other     Breast cancer Sister 44   Colon cancer Neg Hx    Esophageal cancer Neg Hx    Rectal cancer Neg Hx    Stomach cancer Neg Hx     SOCIAL HISTORY: Social History   Socioeconomic History   Marital status: Divorced    Spouse name: Not on file   Number of children: 1   Years of education: Master's   Highest education level: Not on file  Occupational History    Employer: UNEMPLOYED    Comment: disabled  Tobacco Use   Smoking status: Every Day    Packs/day: 1.00    Years: 37.00    Total pack years: 37.00    Types: Cigarettes    Start date: 06/16/1978   Smokeless tobacco: Never   Tobacco comments:    2- 7-16  STILL SMOKING  Vaping Use   Vaping Use: Never used  Substance and Sexual Activity   Alcohol use: Yes    Alcohol/week: 0.0 standard drinks of alcohol    Comment: rare, 3 weekly   Drug use: No   Sexual activity: Not on file  Other Topics Concern   Not on file  Social History Narrative   Divorced   1 son /1 adopted adult son  2 dogs    Sales occupation on disability for now   Regular exercise-yes money stress   Daily caffeine use 2-3 and 2 cups of coffee   On since a security disability since 2012   Patient is right-handed.   Patient has a Master's degree.   Social Determinants of Health   Financial Resource Strain: Not on file  Food Insecurity: Not on file  Transportation Needs: Not on file  Physical Activity: Not on file  Stress: Stress Concern Present (05/10/2021)   [place]    Feeling of Stress : Very much  Social Connections: Not on file  Intimate Partner Violence: Not At Risk (05/10/2021)   Humiliation, Afraid, Rape, and Kick questionnaire    Fear of Current or Ex-Partner: No    Emotionally Abused: No    Physically Abused: No    Sexually Abused: No      PHYSICAL EXAM  Vitals:   11/27/21 1411  BP: 132/90  Pulse: 100  Weight: 203 lb (92.1 kg)  Height: 5' 8"  (1.727 m)       Body mass index is 30.87 kg/m.  Generalized: Well developed, in no acute distress  Cardiology: normal rate and rhythm, no  murmur noted Neurological examination  Mentation: Alert oriented to time, place, history taking. Follows all commands speech and language fluent Cranial nerve II-XII: Pupils were equal round reactive to light. Extraocular movements were full, visual field were full on confrontational test. Facial sensation and strength were normal. Head turning and shoulder shrug  were normal and symmetric. Motor: The motor testing reveals 5 over 5 strength of all 4 extremities. Good symmetric motor tone is noted throughout.  Gait and station: Gait is normal.        11/27/2021    2:36 PM 05/29/2021    1:20 PM 02/09/2020   10:26 AM 11/25/2018    3:35 PM 05/26/2018    3:44 PM  Montreal Cognitive Assessment   Visuospatial/ Executive (0/5) 3 3 5 3 4   Naming (0/3) 3 3 3 3 3   Attention: Read list of digits (0/2) 0 1 1 0 1  Attention: Read list of letters (0/1) 1 1 1 1 1   Attention: Serial 7 subtraction starting at 100 (0/3) 3 3 2 3 3   Language: Repeat phrase (0/2) 1 2 2 1 2   Language : Fluency (0/1) 1 1 1 1 1   Abstraction (0/2) 2 2 2 2 2   Delayed Recall (0/5) 3 3 3 3 3   Orientation (0/6) 5 6 5 5 5   Total 22 25 25 22 25     DIAGNOSTIC DATA (LABS, IMAGING, TESTING) - I reviewed patient records, labs, notes, testing and imaging myself where available.      No data to display           Lab Results  Component Value Date   WBC 6.4 11/20/2021   HGB 14.5 11/20/2021   HCT 44.5 11/20/2021   MCV 86.7 11/20/2021   PLT 216.0 11/20/2021      Component Value Date/Time   NA 140 11/20/2021 1222   NA 147 (H) 04/16/2017 1152   NA 139 12/27/2015 1156   K 3.6 11/20/2021 1222   K 3.4 04/16/2017 1152   K 3.9 12/27/2015 1156   CL 104 11/20/2021 1222   CL 101 04/16/2017 1152   CO2 26 11/20/2021 1222   CO2 30 04/16/2017 1152   CO2 27 12/27/2015 1156   GLUCOSE 108 (H) 11/20/2021  1222   GLUCOSE 89 04/16/2017 1152   BUN 21 11/20/2021 1222   BUN 18 04/16/2017 1152   BUN 14.9 12/27/2015 1156   CREATININE 0.89 11/20/2021 1222   CREATININE 0.84 08/09/2021 1504   CREATININE 1.07 (H) 03/01/2020 1626   CREATININE 0.9 12/27/2015 1156   CALCIUM 9.7 11/20/2021 1222   CALCIUM 9.8 04/16/2017 1152   CALCIUM 9.7 12/27/2015 1156   PROT 6.8 11/20/2021 1222   PROT 6.5 04/16/2017 1152   PROT 6.8 12/27/2015 1156   ALBUMIN 4.2 11/20/2021 1222   ALBUMIN 3.6 04/16/2017 1152   ALBUMIN 4.0 07/08/2016 1432   ALBUMIN 3.5 12/27/2015 1156   AST 21 11/20/2021 1222   AST 21 08/09/2021 1504   AST 29 12/27/2015 1156   ALT 19 11/20/2021 1222   ALT 20 08/09/2021 1504   ALT 26 04/16/2017 1152   ALT 28 12/27/2015 1156   ALKPHOS 105 11/20/2021 1222   ALKPHOS 87 (H) 04/16/2017 1152   ALKPHOS 106 12/27/2015 1156   BILITOT 0.4 11/20/2021 1222   BILITOT 0.5 08/09/2021 1504   BILITOT 0.33 12/27/2015 1156   GFRNONAA >60 08/09/2021 1504   GFRNONAA 57 (L) 03/01/2020 1626   GFRAA 66 03/01/2020 1626   Lab Results  Component Value  Date   CHOL 183 11/20/2021   HDL 51.80 11/20/2021   LDLCALC 109 (H) 11/20/2021   LDLDIRECT 98.7 09/04/2009   TRIG 115.0 11/20/2021   CHOLHDL 4 11/20/2021   Lab Results  Component Value Date   HGBA1C 6.4 11/20/2021   Lab Results  Component Value Date   VITAMINB12 840 11/20/2021   Lab Results  Component Value Date   TSH 2.67 11/20/2021     ASSESSMENT AND PLAN 61 y.o. 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[place]), Adjustment disorder, Allergic rhinitis, Allergy, Alopecia, Anxiety and depression, Arthritis, Blood transfusion without reported diagnosis, BRCA1 positive (01/27/2013), Clotting disorder ([place]), GERD (gastroesophageal reflux disease), HLD (hyperlipidemia), pulmonary embolism, Hypertension, Iron deficiency anemia due to chronic blood loss (11/04/2016), Metrorrhagia, Neuromuscular disorder ([place]), Neuropathy, Obesity, Ovarian  cancer ([place]) (04/2008), Ovarian cancer genetic susceptibility (01/27/2013), Raynaud's syndrome, Right groin mass, Squamous cell carcinoma of skin, Ulcerative colitis ([place]), Vision abnormalities, and Vitamin D deficiency. here with      ICD-10-CM   1. Neurocognitive deficits  R29.818 Ambulatory referral to Psychiatry   R41.89     2. Anxiety and depression  F41.9 Ambulatory referral to Psychiatry   F32.A     3. MCI (mild cognitive impairment) with memory loss  G31.84     4. Attention deficit disorder (ADD) without hyperactivity  F98.8     5. Migraine with aura and without status migrainosus, not intractable  G43.109     6. Insomnia due to medical condition  G47.01       Maraki is doing fairly well. She feels that memory, sleep and mood are stable. We will continue Adderall XR 27m daily as this has been very helpful in managing daytime sleepiness and attention deficit. We will continue Fioricet as needed for abortive therapy. She is not using regularly. She will continue Ambien 5-126mat bedtime.  She will continue Aricept 1015mHS as she does feel this helps. Memory complaints continue. She has word finding difficult and short term memory loss. Most likely related to previous chemo therapy, chronic pain as well as anxiety/depression. [place]/30, previously 25/30. Lost points in attention and recall. She is able to maintain her home and independent living. She is seeing Dr RodSima Matase has recommended ketamine treatments. I have advised she see psychiatry to discuss. Referral placed, today. Neuropathy pain is managed with Lyrica prescribed by PCP/oncology. PDMP shows appropriate refills. No refills needed, today. Adderall last filled 11/08/2021. She will call when she needs refills. She was encouraged to work on healthy lifestyle habits and follow up closely with PCP, neuropsychology and oncology. She will return to see Dr DohBrett Fairy 4-6 months for reevaluation of sleep and discussion of Norflex  rx. This is not a medicine we typically write and have discussed this with Tauri. She verbalizes understanding and agreement with this plan.    Orders Placed This Encounter  Procedures   Ambulatory referral to Psychiatry    Referral Priority:   Routine    Referral Type:   Psychiatric    Referral Reason:   Specialty Services Required    Requested Specialty:   Psychiatry    Number of Visits Requested:   1      No orders of the defined types were placed in this encounter.      AmyDebbora PrestoNP-C 11/27/2021, 3:09 PM [facility] [id number] [address] [phone]

## 2021-12-04 ENCOUNTER — Inpatient Hospital Stay: Payer: Medicare Other | Attending: Internal Medicine

## 2021-12-04 VITALS — BP 132/92 | HR 85 | Temp 98.1°F | Resp 18

## 2021-12-04 DIAGNOSIS — Z859 Personal history of malignant neoplasm, unspecified: Secondary | ICD-10-CM | POA: Diagnosis not present

## 2021-12-04 DIAGNOSIS — Z452 Encounter for adjustment and management of vascular access device: Secondary | ICD-10-CM | POA: Diagnosis not present

## 2021-12-04 DIAGNOSIS — Z95828 Presence of other vascular implants and grafts: Secondary | ICD-10-CM

## 2021-12-04 MED ORDER — HEPARIN SOD (PORK) LOCK FLUSH 100 UNIT/ML IV SOLN
500.0000 [IU] | Freq: Once | INTRAVENOUS | Status: AC
  Administered 2021-12-04: 500 [IU] via INTRAVENOUS

## 2021-12-04 MED ORDER — SODIUM CHLORIDE 0.9% FLUSH
10.0000 mL | Freq: Once | INTRAVENOUS | Status: AC
  Administered 2021-12-04: 10 mL via INTRAVENOUS

## 2021-12-04 NOTE — Patient Instructions (Signed)
Implanted [facility] Guide An implanted port is a device that is placed under the skin. It is usually placed in the chest. The device may vary based on the need. Implanted ports can be used to give IV medicine, to take blood, or to give fluids. You may have an implanted port if: You need IV medicine that would be irritating to the small veins in your hands or arms. You need IV medicines, such as chemotherapy, for a long period of time. You need IV nutrition for a long period of time. You may have fewer limitations when using a port than you would if you used other types of long-term IVs. You will also likely be able to return to normal activities after your incision heals. An implanted port has two main parts: Reservoir. The reservoir is the part where a needle is inserted to give medicines or draw blood. The reservoir is round. After the port is placed, it appears as a small, raised area under your skin. Catheter. The catheter is a small, thin tube that connects the reservoir to a vein. Medicine that is inserted into the reservoir goes into the catheter and then into the vein. How is my port accessed? To access your port: A numbing cream may be placed on the skin over the port site. Your health care provider will put on a mask and sterile gloves. The skin over your port will be cleaned carefully with a germ-killing soap and allowed to dry. Your health care provider will gently pinch the port and insert a needle into it. Your health care provider will check for a blood return to make sure the port is in the vein and is still working (patent). If your port needs to remain accessed to get medicine continuously (constant infusion), your health care provider will place a clear bandage (dressing) over the needle site. The dressing and needle will need to be changed every week, or as told by your health care provider. What is flushing? Flushing helps keep the port working. Follow instructions from your  health care provider about how and when to flush the port. Ports are usually flushed with saline solution or a medicine called heparin. The need for flushing will depend on how the port is used: If the port is only used from time to time to give medicines or draw blood, the port may need to be flushed: Before and after medicines have been given. Before and after blood has been drawn. As part of routine maintenance. Flushing may be recommended every 4-6 weeks. If a constant infusion is running, the port may not need to be flushed. Throw away any syringes in a disposal container that is meant for sharp items (sharps container). You can buy a sharps container from a pharmacy, or you can make one by using an empty hard plastic bottle with a cover. How long will my port stay implanted? The port can stay in for as long as your health care provider thinks it is needed. When it is time for the port to come out, a surgery will be done to remove it. The surgery will be similar to the procedure that was done to put the port in. Follow these instructions at home: Caring for your port and port site Flush your port as told by your health care provider. If you need an infusion over several days, follow instructions from your health care provider about how to take care of your port site. Make sure you: Change your  dressing as told by your health care provider. Wash your hands with soap and water for at least 20 seconds before and after you change your dressing. If soap and water are not available, use alcohol-based hand sanitizer. Place any used dressings or infusion bags into a plastic bag. Throw that bag in the trash. Keep the dressing that covers the needle clean and dry. Do not get it wet. Do not use scissors or sharp objects near the infusion tubing. Keep any external tubes clamped, unless they are being used. Check your port site every day for signs of infection. Check for: Redness, swelling, or  pain. Fluid or blood. Warmth. Pus or a bad smell. Protect the skin around the port site. Avoid wearing bra straps that rub or irritate the site. Protect the skin around your port from seat belts. Place a soft pad over your chest if needed. Bathe or shower as told by your health care provider. The site may get wet as long as you are not actively receiving an infusion. General instructions  Return to your normal activities as told by your health care provider. Ask your health care provider what activities are safe for you. Carry a medical alert card or wear a medical alert bracelet at all times. This will let health care providers know that you have an implanted port in case of an emergency. Where to find more information American Cancer Society: www.cancer.[place] [employer]: www.cancer.net Contact a health care provider if: You have a fever or chills. You have redness, swelling, or pain at the port site. You have fluid or blood coming from your port site. Your incision feels warm to the touch. You have pus or a bad smell coming from the port site. Summary Implanted ports are usually placed in the chest for long-term IV access. Follow instructions from your health care provider about flushing the port and changing bandages (dressings). Take care of the area around your port by avoiding clothing that puts pressure on the area, and by watching for signs of infection. Protect the skin around your port from seat belts. Place a soft pad over your chest if needed. Contact a health care provider if you have a fever or you have redness, swelling, pain, fluid, or a bad smell at the port site. This information is not intended to replace advice given to you by your health care provider. Make sure you discuss any questions you have with your health care provider. Document Revised: 10/31/2020 Document Reviewed: 10/31/2020 Elsevier Patient Education  [place].

## 2021-12-10 ENCOUNTER — Inpatient Hospital Stay: Payer: Medicare Other

## 2021-12-15 ENCOUNTER — Other Ambulatory Visit: Payer: Self-pay | Admitting: Neurology

## 2021-12-17 NOTE — Telephone Encounter (Signed)
Last OV was on 11/27/21.  Next OV is scheduled for 04/15/22.  Last RX was written on 07/26/21 for 180 tabs.   [place] Drug Database has been reviewed.

## 2021-12-27 DIAGNOSIS — M1712 Unilateral primary osteoarthritis, left knee: Secondary | ICD-10-CM | POA: Diagnosis not present

## 2021-12-27 DIAGNOSIS — M25562 Pain in left knee: Secondary | ICD-10-CM | POA: Diagnosis not present

## 2021-12-31 ENCOUNTER — Encounter: Payer: Self-pay | Admitting: Hematology & Oncology

## 2021-12-31 ENCOUNTER — Encounter (HOSPITAL_COMMUNITY): Payer: Self-pay

## 2021-12-31 ENCOUNTER — Ambulatory Visit (HOSPITAL_BASED_OUTPATIENT_CLINIC_OR_DEPARTMENT_OTHER): Payer: Medicare Other | Admitting: Student in an Organized Health Care Education/Training Program

## 2021-12-31 ENCOUNTER — Encounter (HOSPITAL_COMMUNITY): Payer: Self-pay | Admitting: Student in an Organized Health Care Education/Training Program

## 2021-12-31 DIAGNOSIS — F331 Major depressive disorder, recurrent, moderate: Secondary | ICD-10-CM

## 2021-12-31 DIAGNOSIS — F4312 Post-traumatic stress disorder, chronic: Secondary | ICD-10-CM

## 2021-12-31 NOTE — Plan of Care (Signed)
  Problem: Depression CCP Problem  1  Goal: LTG: [place] WILL SCORE LESS THAN 10 ON THE PATIENT HEALTH QUESTIONNAIRE (PHQ-9) Outcome: Initial   Problem: Depression CCP Problem  1  Goal: STG: Irina WILL PARTICIPATE IN AT LEAST 80% OF SCHEDULED INDIVIDUAL PSYCHOTHERAPY SESSIONS Outcome: Initial   Problem: Depression CCP Problem  1  Goal: STG: Reduce overall depression score by a minimum of 25% on the Patient Health Questionnaire (PHQ-9) or the Montgomery-Asberg Depression Rating Scale (MADRS) Outcome: Initial
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
## 2022-01-04 ENCOUNTER — Encounter: Payer: Self-pay | Admitting: Internal Medicine

## 2022-01-07 ENCOUNTER — Ambulatory Visit (HOSPITAL_COMMUNITY): Payer: Medicare Other | Admitting: Student in an Organized Health Care Education/Training Program

## 2022-01-07 DIAGNOSIS — F4312 Post-traumatic stress disorder, chronic: Secondary | ICD-10-CM

## 2022-01-07 DIAGNOSIS — F331 Major depressive disorder, recurrent, moderate: Secondary | ICD-10-CM | POA: Diagnosis not present

## 2022-01-07 NOTE — Progress Notes (Signed)
[facility] [place] [address] Dept: [phone] Dept Fax: [phone]  Psychotherapy Progress Note  Patient ID: [name], female  DOB: 05/16/1960, 61 y.o.  MRN: [id number]  01/07/2022 Start time: 3:45 PM End time: 4:30 PM  Method of Visit: Face-to-Face  Present: patient  Current Concerns:  She reports that she has had 1 good day in the last week.  She reports that she has canceled multiple activities.  She reports she has not taken her daughter to the park as much as she could have.  She reports she attempted to reduce her smoking by only bringing 2 cigarettes out and leaving the rest of the pack in the house.  She reports that she "failed miserably" because after smoking 2 cigarettes she went and got the pack to continue smoking.  She relates a story of the pregnancy of her son and that during that time she was able to reduce her smoking to 6 cigarettes a day.   She reports no SI, HI, or AVH. She reports no other concerns at present.    Current Symptoms: Anxiety and Depressed Mood  Psychiatric Specialty Exam: General Appearance: Casual and Fairly Groomed  Eye Contact:  Good  Speech:  Clear and Coherent and Normal Rate  Volume:  Normal  Mood:  Euthymic  Affect:  Appropriate and Congruent  Thought Process:  Coherent and Goal Directed  Orientation:  Full (Time, Place, and Person)  Thought Content:  Logical  Suicidal Thoughts:  No  Homicidal Thoughts:  No  Memory:  Immediate;   Good Recent;   Good  Judgement:  Good  Insight:  Good  Psychomotor Activity:  Normal  Concentration:  Concentration: Good and Attention Span: Good  Recall:  Good  Fund of Knowledge:Good  Language: Good  Akathisia:  Negative  Handed:  Right  AIMS (if indicated):  not done  Assets:  Communication Skills Desire for Improvement Housing Resilience  ADL's:  Intact  Cognition: WNL  Sleep:  Fair      Diagnosis: MDD, chronic PTSD  Anticipated Frequency of Visits: weekly Anticipated Length of Treatment Episode: 12-16  Short Term Goals/Goals for Treatment Session: Reduce Cigarette use, increase frequency of going to the gym, increase frequency of taking dog to the park. Progress Towards Goals: Initial  Treatment Intervention: Supportive therapy  Medical Necessity: Prevented onset or worsening of patient condition  Assessment Tools:    12/31/2021    3:04 PM 11/20/2021   11:36 AM 05/10/2021    2:02 PM  Depression screen PHQ 2/9  Decreased Interest 3 2 1   Down, Depressed, Hopeless 3 0 1  PHQ - 2 Score 6 2 2   Altered sleeping 3 3 3   Tired, decreased energy 3 2 2   Change in appetite 0 2 1  Feeling bad or failure about yourself  0 0 1  Trouble concentrating 3 2 1   Moving slowly or fidgety/restless 0 0 1  Suicidal thoughts 0 0 0  PHQ-9 Score 15 11 11   Difficult doing work/chores Extremely dIfficult Extremely dIfficult Not difficult at all   No flowsheet data found.   Collaboration of Care:   Patient/Guardian was advised Release of Information must be obtained prior to any record release in order to collaborate their care with an outside provider. Patient/Guardian was advised if they have not already done so to contact the registration department to sign all necessary forms in order for [place] to release information regarding their  care.   Consent: Patient/Guardian gives verbal consent for treatment and assignment of benefits for services provided during this visit. Patient/Guardian expressed understanding and agreed to proceed.   Plan: She would like to set goals for herself to keep her self accountable.  She will set the following goals until our next appointment.  She will reduce her smoking to 1 pack per day (currently at ~2 ppd), she will go to the gym 4 times a week, and she will take her dog to the dog park 4 times a week.  She will return to the office in two weeks as the  office will be closed next Monday.   Briant Cedar, MD 01/07/2022

## 2022-01-14 MED ORDER — OZEMPIC (0.25 OR 0.5 MG/DOSE) 2 MG/3ML ~~LOC~~ SOPN: 0.5000 mg | PEN_INJECTOR | SUBCUTANEOUS | 1 refills | Status: DC

## 2022-01-14 NOTE — Telephone Encounter (Signed)
I sent in refill for 0.5 mg  weekly dose with one refill  Have her make a follow up visit in a month or so for med  weight check and FU

## 2022-01-15 ENCOUNTER — Other Ambulatory Visit: Payer: Self-pay | Admitting: Family Medicine

## 2022-01-15 MED ORDER — AMPHETAMINE-DEXTROAMPHET ER 20 MG PO CP24: 20.0000 mg | ORAL_CAPSULE | Freq: Every day | ORAL | 0 refills | Status: DC

## 2022-01-15 NOTE — Telephone Encounter (Signed)
I have routed this request to Debbora Presto, NP for review. The pt is due for the medication and  registry was verified.

## 2022-01-15 NOTE — Telephone Encounter (Signed)
Pt request refill for amphetamine-dextroamphetamine (ADDERALL XR) 20 MG 24 hr capsule at [id number]

## 2022-01-21 ENCOUNTER — Ambulatory Visit (HOSPITAL_BASED_OUTPATIENT_CLINIC_OR_DEPARTMENT_OTHER): Payer: Medicare Other | Admitting: Student in an Organized Health Care Education/Training Program

## 2022-01-21 DIAGNOSIS — F331 Major depressive disorder, recurrent, moderate: Secondary | ICD-10-CM | POA: Diagnosis not present

## 2022-01-21 DIAGNOSIS — F4312 Post-traumatic stress disorder, chronic: Secondary | ICD-10-CM | POA: Diagnosis not present
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
## 2022-01-28 ENCOUNTER — Other Ambulatory Visit: Payer: Self-pay | Admitting: Hematology & Oncology

## 2022-01-28 DIAGNOSIS — C801 Malignant (primary) neoplasm, unspecified: Secondary | ICD-10-CM

## 2022-02-04 ENCOUNTER — Ambulatory Visit (HOSPITAL_COMMUNITY): Payer: Medicare Other | Admitting: Student in an Organized Health Care Education/Training Program

## 2022-02-04 DIAGNOSIS — F411 Generalized anxiety disorder: Secondary | ICD-10-CM | POA: Diagnosis not present

## 2022-02-04 DIAGNOSIS — F331 Major depressive disorder, recurrent, moderate: Secondary | ICD-10-CM

## 2022-02-04 DIAGNOSIS — F4312 Post-traumatic stress disorder, chronic: Secondary | ICD-10-CM

## 2022-02-04 DIAGNOSIS — F332 Major depressive disorder, recurrent severe without psychotic features: Secondary | ICD-10-CM

## 2022-02-04 MED ORDER — HYDROXYZINE PAMOATE 25 MG PO CAPS: 25.0000 mg | ORAL_CAPSULE | Freq: Three times a day (TID) | ORAL | 0 refills | Status: DC | PRN
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
## 2022-02-06 ENCOUNTER — Encounter: Payer: Medicare Other | Attending: Psychology | Admitting: Psychology

## 2022-02-06 DIAGNOSIS — F331 Major depressive disorder, recurrent, moderate: Secondary | ICD-10-CM | POA: Insufficient documentation

## 2022-02-06 DIAGNOSIS — R4189 Other symptoms and signs involving cognitive functions and awareness: Secondary | ICD-10-CM | POA: Insufficient documentation

## 2022-02-06 DIAGNOSIS — G479 Sleep disorder, unspecified: Secondary | ICD-10-CM | POA: Insufficient documentation

## 2022-02-06 DIAGNOSIS — F4310 Post-traumatic stress disorder, unspecified: Secondary | ICD-10-CM | POA: Insufficient documentation

## 2022-02-06 DIAGNOSIS — R29818 Other symptoms and signs involving the nervous system: Secondary | ICD-10-CM | POA: Diagnosis not present

## 2022-02-07 ENCOUNTER — Encounter: Payer: Self-pay | Admitting: Psychology

## 2022-02-07 ENCOUNTER — Other Ambulatory Visit: Payer: Self-pay | Admitting: Family Medicine

## 2022-02-07 MED ORDER — AMPHETAMINE-DEXTROAMPHET ER 20 MG PO CP24
20.0000 mg | ORAL_CAPSULE | Freq: Every day | ORAL | 0 refills | Status: DC
Start: 2022-02-07 — End: 2022-03-26
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
## 2022-02-07 NOTE — Telephone Encounter (Signed)
Pt is needing a refill on her amphetamine-dextroamphetamine (ADDERALL XR) 20 MG 24 hr capsule sent to the [facility] on [address]

## 2022-02-08 ENCOUNTER — Encounter: Payer: Self-pay | Admitting: Hematology & Oncology

## 2022-02-08 ENCOUNTER — Inpatient Hospital Stay (HOSPITAL_BASED_OUTPATIENT_CLINIC_OR_DEPARTMENT_OTHER): Payer: Medicare Other | Admitting: Hematology & Oncology

## 2022-02-08 ENCOUNTER — Inpatient Hospital Stay: Payer: Medicare Other | Attending: Internal Medicine

## 2022-02-08 ENCOUNTER — Inpatient Hospital Stay: Payer: Medicare Other

## 2022-02-08 VITALS — BP 150/95 | HR 78 | Temp 97.8°F | Resp 20 | Ht 68.0 in | Wt 191.4 lb

## 2022-02-08 DIAGNOSIS — C801 Malignant (primary) neoplasm, unspecified: Secondary | ICD-10-CM | POA: Diagnosis not present

## 2022-02-08 DIAGNOSIS — K5900 Constipation, unspecified: Secondary | ICD-10-CM | POA: Diagnosis not present

## 2022-02-08 DIAGNOSIS — F17200 Nicotine dependence, unspecified, uncomplicated: Secondary | ICD-10-CM | POA: Diagnosis not present

## 2022-02-08 DIAGNOSIS — D500 Iron deficiency anemia secondary to blood loss (chronic): Secondary | ICD-10-CM

## 2022-02-08 DIAGNOSIS — Z859 Personal history of malignant neoplasm, unspecified: Secondary | ICD-10-CM | POA: Diagnosis not present

## 2022-02-08 DIAGNOSIS — D509 Iron deficiency anemia, unspecified: Secondary | ICD-10-CM | POA: Diagnosis not present

## 2022-02-08 DIAGNOSIS — Z95828 Presence of other vascular implants and grafts: Secondary | ICD-10-CM

## 2022-02-08 LAB — CBC WITH DIFFERENTIAL (CANCER CENTER ONLY)
Abs Immature Granulocytes: 0.01 10*3/uL (ref 0.00–0.07)
Basophils Absolute: 0 10*3/uL (ref 0.0–0.1)
Basophils Relative: 1 %
Eosinophils Absolute: 0.1 10*3/uL (ref 0.0–0.5)
Eosinophils Relative: 3 %
HCT: 38.9 % (ref 36.0–46.0)
Hemoglobin: 12.8 g/dL (ref 12.0–15.0)
Immature Granulocytes: 0 %
Lymphocytes Relative: 23 %
Lymphs Abs: 1.3 10*3/uL (ref 0.7–4.0)
MCH: 28.9 pg (ref 26.0–34.0)
MCHC: 32.9 g/dL (ref 30.0–36.0)
MCV: 87.8 fL (ref 80.0–100.0)
Monocytes Absolute: 0.4 10*3/uL (ref 0.1–1.0)
Monocytes Relative: 8 %
Neutro Abs: 3.7 10*3/uL (ref 1.7–7.7)
Neutrophils Relative %: 65 %
Platelet Count: 189 10*3/uL (ref 150–400)
RBC: 4.43 MIL/uL (ref 3.87–5.11)
RDW: 16.7 % — ABNORMAL HIGH (ref 11.5–15.5)
WBC Count: 5.6 10*3/uL (ref 4.0–10.5)
nRBC: 0 % (ref 0.0–0.2)

## 2022-02-08 LAB — CMP (CANCER CENTER ONLY)
ALT: 27 U/L (ref 0–44)
AST: 27 U/L (ref 15–41)
Albumin: 3.9 g/dL (ref 3.5–5.0)
Alkaline Phosphatase: 97 U/L (ref 38–126)
Anion gap: 6 (ref 5–15)
BUN: 24 mg/dL — ABNORMAL HIGH (ref 8–23)
CO2: 26 mmol/L (ref 22–32)
Calcium: 9.6 mg/dL (ref 8.9–10.3)
Chloride: 107 mmol/L (ref 98–111)
Creatinine: 0.96 mg/dL (ref 0.44–1.00)
GFR, Estimated: >60 mL/min (ref >60)
Glucose, Bld: 98 mg/dL (ref 70–99)
Potassium: 3.6 mmol/L (ref 3.5–5.1)
Sodium: 139 mmol/L (ref 135–145)
Total Bilirubin: 0.4 mg/dL (ref 0.3–1.2)
Total Protein: 6.1 g/dL — ABNORMAL LOW (ref 6.5–8.1)

## 2022-02-08 LAB — RETICULOCYTES
Immature Retic Fract: 17.7 % — ABNORMAL HIGH (ref 2.3–15.9)
RBC.: 4.47 MIL/uL (ref 3.87–5.11)
Retic Count, Absolute: 85.8 10*3/uL (ref 19.0–186.0)
Retic Ct Pct: 1.9 % (ref 0.4–3.1)

## 2022-02-08 LAB — IRON AND IRON BINDING CAPACITY (CC-WL,HP ONLY)
Iron: 43 ug/dL (ref 28–170)
Saturation Ratios: 15 % (ref 10.4–31.8)
TIBC: 287 ug/dL (ref 250–450)
UIBC: 244 ug/dL (ref 148–442)

## 2022-02-08 LAB — LACTATE DEHYDROGENASE: LDH: 198 U/L — ABNORMAL HIGH (ref 98–192)

## 2022-02-08 LAB — FERRITIN: Ferritin: 28 ng/mL (ref 11–307)

## 2022-02-08 MED ORDER — HEPARIN SOD (PORK) LOCK FLUSH 100 UNIT/ML IV SOLN
500.0000 [IU] | Freq: Once | INTRAVENOUS | Status: AC
  Administered 2022-02-08: 500 [IU] via INTRAVENOUS

## 2022-02-08 MED ORDER — SODIUM CHLORIDE 0.9% FLUSH
10.0000 mL | Freq: Once | INTRAVENOUS | Status: AC
  Administered 2022-02-08: 10 mL via INTRAVENOUS

## 2022-02-08 NOTE — Patient Instructions (Signed)

## 2022-02-08 NOTE — Progress Notes (Signed)
Hematology and Oncology Follow Up Visit  [name] [id number] 06-21-1960 61 y.o. 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 is here today for follow-up.  We last saw her 6 months ago.  Since then, she has been under quite a bit of stress.  Her poor dog as she absolutely load passed away.  She does have a new puppy now.  This is helping her a little bit.  She is having some cognitive issues.  She has been seeing a neuropsychologist.  I think she may be also seeing a psychiatrist.  He wants to put her on some antianxiety medication.  I am not sure if she is taking this.  She is exercising.  She is lost 20 pounds since we last saw her..  She had little bit of problems with constipation.  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  I told her to try some MiraLAX.  She has had no probably increased cough or shortness of breath.  She is still smoking.  I think she smokes about a pack a day.  She has had no fever.  She has had no problems with COVID.  Her last mammogram was done back in April.  Everything was fine on the mammogram.  Yearly follow-up was recommended.  She has had no rashes.  Overall, I would say that her performance status is probably ECOG 1.    Medications:  Allergies as of 02/08/2022       Reactions   Codeine Nausea Only   Orange Itching, Rash        Medication List        Accurate as of February 08, 2022  1:17 PM. If you have any questions, ask your nurse or doctor.          amLODipine 10 MG tablet Commonly known as: NORVASC Take 1 tablet (10 mg total) by mouth daily.   amphetamine-dextroamphetamine 20 MG 24 hr capsule Commonly known as: Adderall XR Take 1 capsule (20 mg total) by mouth daily.   aspirin 81 MG  tablet Take 81 mg by mouth daily. 2 TABS IN AM   butalbital-acetaminophen-caffeine 50-325-40 MG tablet Commonly known as: FIORICET Take 1 tablet by mouth every 6 (six) hours as needed for headache (migraine abortion). TAKE 1 TABLET BY MOUTH EVERY 6 HOURS AS NEEDED FOR HEADACHE further refills should come from Neurology team .   calcium carbonate 500 MG chewable tablet Commonly known as: TUMS - dosed in mg elemental calcium Chew 1 tablet by mouth daily as needed for indigestion or heartburn.   diclofenac 75 MG EC tablet Commonly known as: VOLTAREN TK 1 T PO BID AFTER MEALS FOR INFLAMMATION / PAIN / SWELLING PRN ONLY   donepezil 10 MG tablet Commonly known as: ARICEPT TAKE 1 TABLET(10 MG) BY MOUTH EVERY MORNING   DULoxetine 60 MG capsule Commonly known as: CYMBALTA TAKE 1 CAPSULE(60 MG) BY MOUTH DAILY   hydrOXYzine 25 MG capsule Commonly known as: VISTARIL Take 1 capsule (25 mg total) by mouth 3 (three) times daily as needed.   multivitamin with minerals Tabs tablet Take 1 tablet by mouth daily.   orphenadrine 100 MG tablet Commonly known as: NORFLEX Take 1 tablet (100 mg total) by mouth 2 (two) times daily as needed for muscle spasms.   Ozempic (0.25 or 0.5 MG/DOSE) 2  MG/3ML Sopn Generic drug: Semaglutide(0.25 or 0.5MG/DOS) Inject 0.5 mg into the skin once a week.   pregabalin 200 MG capsule Commonly known as: LYRICA TAKE 1 CAPSULE(200 MG) BY MOUTH TWICE DAILY   rosuvastatin 5 MG tablet Commonly known as: Crestor Take 2 tablets (10 mg total) by mouth daily.   spironolactone 25 MG tablet Commonly known as: ALDACTONE TAKE 1 TABLET(25 MG) BY MOUTH DAILY   Vitamin D (Ergocalciferol) 1.25 MG (50000 UNIT) Caps capsule Commonly known as: DRISDOL TAKE 1 CAPSULE BY MOUTH 1 TIME A WEEK   zolpidem 5 MG tablet Commonly known as: AMBIEN TAKE 1 TO 2 TABLETS(5 TO 10 MG) BY MOUTH EVERY NIGHT AT BEDTIME AS NEEDED        Allergies:  Allergies  Allergen Reactions    Codeine Nausea Only   Orange Itching and Rash    Past Medical History, Surgical history, Social history, and Family History were reviewed and updated.  Review of Systems: Review of Systems  Constitutional: Negative.   HENT: Negative.    Eyes: Negative.   Respiratory: Negative.    Cardiovascular: Negative.   Gastrointestinal:  Positive for abdominal pain.  Genitourinary: Negative.   Musculoskeletal:  Positive for joint pain.  Skin: Negative.   Neurological:  Positive for tingling.  Endo/Heme/Allergies: Negative.   Psychiatric/Behavioral: Negative.        Physical Exam:  height is _0  (1.727 m) and weight is 191 lb 6.4 oz (86.8 kg). Her oral temperature is 97.8 F (36.6 C). Her blood pressure is 150/95 (abnormal) and her pulse is 78. Her respiration is 20 and oxygen saturation is 96%.   Wt Readings from Last 3 Encounters:  02/08/22 191 lb 6.4 oz (86.8 kg)  11/27/21 203 lb (92.1 kg)  11/20/21 204 lb 3.2 oz (92.6 kg)  Her vital signs show temperature of 97.9.  Pulse 92.  Blood pressure 132/91.  Weight is 214 pounds.  Physical Exam Vitals reviewed.  Constitutional:      Comments: Her breast exam shows bilateral breast reductions.  I really cannot palpate any thing unusual with respect to the right breast.  There is no distinct mass.  There is no axillary adenopathy.  HENT:     Head: Normocephalic and atraumatic.  Eyes:     Pupils: Pupils are equal, round, and reactive to light.  Cardiovascular:     Rate and Rhythm: Normal rate and regular rhythm.     Heart sounds: Normal heart sounds.  Pulmonary:     Effort: Pulmonary effort is normal.     Breath sounds: Normal breath sounds.  Abdominal:     General: Bowel sounds are normal.     Palpations: Abdomen is soft.  Musculoskeletal:        General: No tenderness or deformity. Normal range of motion.     Cervical back: Normal range of motion.     Comments: Extremities shows decreased range of motion of the right shoulder.   She has decreased abduction and external rotation.  Lymphadenopathy:     Cervical: No cervical adenopathy.  Skin:    General: Skin is warm and dry.     Findings: No erythema or rash.  Neurological:     Mental Status: She is alert and oriented to person, place, and time.  Psychiatric:        Behavior: Behavior normal.        Thought Content: Thought content normal.        Judgment: Judgment normal.  Lab Results  Component Value Date   WBC 5.6 02/08/2022   HGB 12.8 02/08/2022   HCT 38.9 02/08/2022   MCV 87.8 02/08/2022   PLT 189 02/08/2022   Lab Results  Component Value Date   FERRITIN 53 08/09/2021   IRON 44 08/09/2021   TIBC 382 08/09/2021   UIBC 338 08/09/2021   IRONPCTSAT 12 08/09/2021   Lab Results  Component Value Date   RETICCTPCT 1.9 02/08/2022   RBC 4.47 02/08/2022   No results found for: "KPAFRELGTCHN", "LAMBDASER", "KAPLAMBRATIO" No results found for: "IGGSERUM", "IGA", "IGMSERUM" No results found for: "TOTALPROTELP", "ALBUMINELP", "A1GS", "A2GS", "BETS", "BETA2SER", "GAMS", "MSPIKE", "SPEI"   Chemistry      Component Value Date/Time   NA 140 11/20/2021 1222   NA 147 (H) 04/16/2017 1152   NA 139 12/27/2015 1156   K 3.6 11/20/2021 1222   K 3.4 04/16/2017 1152   K 3.9 12/27/2015 1156   CL 104 11/20/2021 1222   CL 101 04/16/2017 1152   CO2 26 11/20/2021 1222   CO2 30 04/16/2017 1152   CO2 27 12/27/2015 1156   BUN 21 11/20/2021 1222   BUN 18 04/16/2017 1152   BUN 14.9 12/27/2015 1156   CREATININE 0.89 11/20/2021 1222   CREATININE 0.84 08/09/2021 1504   CREATININE 1.07 (H) 03/01/2020 1626   CREATININE 0.9 12/27/2015 1156      Component Value Date/Time   CALCIUM 9.7 11/20/2021 1222   CALCIUM 9.8 04/16/2017 1152   CALCIUM 9.7 12/27/2015 1156   ALKPHOS 105 11/20/2021 1222   ALKPHOS 87 (H) 04/16/2017 1152   ALKPHOS 106 12/27/2015 1156   AST 21 11/20/2021 1222   AST 21 08/09/2021 1504   AST 29 12/27/2015 1156   ALT 19 11/20/2021 1222   ALT  20 08/09/2021 1504   ALT 26 04/16/2017 1152   ALT 28 12/27/2015 1156   BILITOT 0.4 11/20/2021 1222   BILITOT 0.5 08/09/2021 1504   BILITOT 0.33 12/27/2015 1156       Impression and Plan: Ms. Mahan is very pleasant 61 yo African American female with a remote history of a poorly differentiated carcinoma which was felt to be ovarian, BRCA +. She completed 6 cycles of Carboplatin/Taxotere over 12 years ago.   I know that she is under quite a bit of stress.  I feel bad about that.  Hopefully, her new puppy will make her feel better.  I told her that if she continues have problems with this constipation, she can certainly go to her gastroenterologist who will be more than happy to help her out.  She still has her Port-A-Cath in.  We had to make sure that we flushed this every 2 months.  We will see what her iron studies look like.  I will still plan for follow-up in 6 months.   [name], MD 9/29/20231:17 PM

## 2022-02-09 LAB — CA 125: Cancer Antigen (CA) 125: 3.8 U/mL (ref 0.0–38.1)

## 2022-02-11 ENCOUNTER — Ambulatory Visit (HOSPITAL_COMMUNITY): Payer: Medicare Other | Admitting: Student in an Organized Health Care Education/Training Program

## 2022-02-11 ENCOUNTER — Encounter: Payer: Self-pay | Admitting: *Deleted

## 2022-02-12 NOTE — Progress Notes (Signed)
Sluggish blood return and unable to obtain blood sample. Flushed slowly too. She refused to cathflo today. Lab had to draw blood. Pt agreed to try cathflo next port flush apt.

## 2022-02-19 NOTE — Progress Notes (Unsigned)
No chief complaint on file.   HPI: [name] 61 y.o. come in for Chronic disease management   Fu meds  weight control  Bp  Recent  ROS: Iv iron pertinent positives and negatives per HPI.  Past Medical History:  Diagnosis Date   Adenocarcinoma ([place])    unknown primary probaby ovarian 2009 chemo   Adjustment disorder    Allergic rhinitis    Allergy    Alopecia    Anxiety and depression    Arthritis    Blood transfusion without reported diagnosis    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   Clotting disorder (HCC)    GERD (gastroesophageal reflux disease)    HLD (hyperlipidemia)    Hx pulmonary embolism    Hypertension    Iron deficiency anemia due to chronic blood loss 11/04/2016   Metrorrhagia    Neuromuscular disorder (HCC)    Neuropathy    Obesity    Ovarian cancer ([place]) 04/2008   ChemoTherapy   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   Raynaud's syndrome    Right groin mass    Squamous cell carcinoma of skin    Ulcerative colitis (HCC)    Vision abnormalities    Vitamin D deficiency     Family History  Problem Relation Age of Onset   Pulmonary embolism Mother    Hypertension Father    Breast cancer Maternal Grandmother        diagnosed in early 44s   Hyperlipidemia Other    Breast cancer Sister 53   Colon cancer Neg Hx    Esophageal cancer Neg Hx    Rectal cancer Neg Hx    Stomach cancer Neg Hx     Social History   Socioeconomic History   Marital status: Divorced    Spouse name: Not on file   Number of children: 1   Years of education: Master's   Highest education level: Not on file  Occupational History    Employer: UNEMPLOYED    Comment: disabled  Tobacco Use   Smoking status: Every Day    Packs/day: 1.00    Years: 37.00    Total pack years: 37.00    Types: Cigarettes    Start date: 06/16/1978   Smokeless tobacco: Never   Tobacco comments:    2- 7-16  STILL SMOKING  Vaping Use   Vaping Use: Never used  Substance and Sexual  Activity   Alcohol use: Yes    Alcohol/week: 0.0 standard drinks of alcohol    Comment: rare, 3 weekly   Drug use: No   Sexual activity: Not Currently  Other Topics Concern   Not on file  Social History Narrative   Divorced   1 son /1 adopted adult son  2 dogs    Sales occupation on disability for now   Regular exercise-yes money stress   Daily caffeine use 2-3 and 2 cups of coffee   On since a security disability since 2012   Patient is right-handed.   Patient has a Master's degree.   Social Determinants of Health   Financial Resource Strain: Not on file  Food Insecurity: Not on file  Transportation Needs: Not on file  Physical Activity: Not on file  Stress: Stress Concern Present (05/10/2021)   [name]    Feeling of Stress : Very much  Social Connections: Not on file    Outpatient Medications Prior to Visit  Medication Sig Dispense Refill   amLODipine ([place])  10 MG tablet Take 1 tablet (10 mg total) by mouth daily. 90 tablet 1   amphetamine-dextroamphetamine (ADDERALL XR) 20 MG 24 hr capsule Take 1 capsule (20 mg total) by mouth daily. 30 capsule 0   aspirin 81 MG tablet Take 81 mg by mouth daily. 2 TABS IN AM     butalbital-acetaminophen-caffeine (FIORICET) 50-325-40 MG tablet Take 1 tablet by mouth every 6 (six) hours as needed for headache (migraine abortion). TAKE 1 TABLET BY MOUTH EVERY 6 HOURS AS NEEDED FOR HEADACHE further refills should come from Neurology team . (Patient not taking: Reported on 02/08/2022) 10 tablet 5   calcium carbonate (TUMS - DOSED IN MG ELEMENTAL CALCIUM) 500 MG chewable tablet Chew 1 tablet by mouth daily as needed for indigestion or heartburn.     diclofenac (VOLTAREN) 75 MG EC tablet TK 1 T PO BID AFTER MEALS FOR INFLAMMATION / PAIN / SWELLING PRN ONLY  1   donepezil (ARICEPT) 10 MG tablet TAKE 1 TABLET(10 MG) BY MOUTH EVERY MORNING 90 tablet 1   DULoxetine (CYMBALTA) 60 MG  capsule TAKE 1 CAPSULE(60 MG) BY MOUTH DAILY 90 capsule 0   hydrOXYzine (VISTARIL) 25 MG capsule Take 1 capsule (25 mg total) by mouth 3 (three) times daily as needed. (Patient not taking: Reported on 02/08/2022) 30 capsule 0   Multiple Vitamin (MULTIVITAMIN WITH MINERALS) TABS tablet Take 1 tablet by mouth daily.     orphenadrine (NORFLEX) 100 MG tablet Take 1 tablet (100 mg total) by mouth 2 (two) times daily as needed for muscle spasms. 180 tablet 0   pregabalin (LYRICA) 200 MG capsule TAKE 1 CAPSULE(200 MG) BY MOUTH TWICE DAILY 60 capsule 3   rosuvastatin (CRESTOR) 5 MG tablet Take 2 tablets (10 mg total) by mouth daily. 90 tablet 1   Semaglutide,0.25 or 0.5MG/DOS, (OZEMPIC, 0.25 OR 0.5 MG/DOSE,) 2 MG/3ML SOPN Inject 0.5 mg into the skin once a week. 3 mL 1   spironolactone (ALDACTONE) 25 MG tablet TAKE 1 TABLET(25 MG) BY MOUTH DAILY 90 tablet 1   Vitamin D, Ergocalciferol, (DRISDOL) 1.25 MG (50000 UNIT) CAPS capsule TAKE 1 CAPSULE BY MOUTH 1 TIME A WEEK 13 capsule 2   zolpidem (AMBIEN) 5 MG tablet TAKE 1 TO 2 TABLETS(5 TO 10 MG) BY MOUTH EVERY NIGHT AT BEDTIME AS NEEDED 180 tablet 1   No facility-administered medications prior to visit.     EXAM:  There were no vitals taken for this visit.  There is no height or weight on file to calculate BMI. Wt Readings from Last 3 Encounters:  02/08/22 191 lb 6.4 oz (86.8 kg)  11/27/21 203 lb (92.1 kg)  11/20/21 204 lb 3.2 oz (92.6 kg)   BP Readings from Last 3 Encounters:  02/08/22 (!) 150/95  12/04/21 (!) 132/92  11/27/21 132/90     GENERAL: vitals reviewed and listed above, alert, oriented, appears well hydrated and in no acute distress HEENT: atraumatic, conjunctiva  clear, no obvious abnormalities on inspection of external nose and ears OP : no lesion edema or exudate  NECK: no obvious masses on inspection palpation  LUNGS: clear to auscultation bilaterally, no wheezes, rales or rhonchi, good air movement CV: HRRR, no clubbing  cyanosis or  peripheral edema nl cap refill  MS: moves all extremities without noticeable focal  abnormality PSYCH: pleasant and cooperative, no obvious depression or anxiety Lab Results  Component Value Date   WBC 5.6 02/08/2022   HGB 12.8 02/08/2022   HCT 38.9 02/08/2022   PLT  189 02/08/2022   GLUCOSE 98 02/08/2022   CHOL 183 11/20/2021   TRIG 115.0 11/20/2021   HDL 51.80 11/20/2021   LDLDIRECT 98.7 09/04/2009   LDLCALC 109 (H) 11/20/2021   ALT 27 02/08/2022   AST 27 02/08/2022   NA 139 02/08/2022   K 3.6 02/08/2022   CL 107 02/08/2022   CREATININE 0.96 02/08/2022   BUN 24 (H) 02/08/2022   CO2 26 02/08/2022   TSH 2.67 11/20/2021   INR 0.86 06/02/2013   HGBA1C 6.4 11/20/2021   BP Readings from Last 3 Encounters:  02/08/22 (!) 150/95  12/04/21 (!) 132/92  11/27/21 132/90    ASSESSMENT AND PLAN:  Discussed the following assessment and plan:  Essential hypertension  Medication management  Bariatric surgery status  TOBACCO USE  Obesity (BMI 30-39.9)  -Patient advised to return or notify health care team  if  new concerns arise.  There are no Patient Instructions on file for this visit.   Standley Brooking. Oletha Tolson M.D.

## 2022-02-20 ENCOUNTER — Encounter: Payer: Self-pay | Admitting: Internal Medicine

## 2022-02-20 ENCOUNTER — Ambulatory Visit (INDEPENDENT_AMBULATORY_CARE_PROVIDER_SITE_OTHER): Payer: Medicare Other | Admitting: Internal Medicine

## 2022-02-20 VITALS — BP 146/98 | HR 77 | Temp 97.7°F | Wt 186.2 lb

## 2022-02-20 DIAGNOSIS — R7309 Other abnormal glucose: Secondary | ICD-10-CM | POA: Diagnosis not present

## 2022-02-20 DIAGNOSIS — I10 Essential (primary) hypertension: Secondary | ICD-10-CM | POA: Diagnosis not present

## 2022-02-20 DIAGNOSIS — Z79899 Other long term (current) drug therapy: Secondary | ICD-10-CM

## 2022-02-20 DIAGNOSIS — E669 Obesity, unspecified: Secondary | ICD-10-CM

## 2022-02-20 DIAGNOSIS — F17200 Nicotine dependence, unspecified, uncomplicated: Secondary | ICD-10-CM | POA: Diagnosis not present

## 2022-02-20 DIAGNOSIS — Z9884 Bariatric surgery status: Secondary | ICD-10-CM

## 2022-02-20 DIAGNOSIS — F419 Anxiety disorder, unspecified: Secondary | ICD-10-CM

## 2022-02-20 MED ORDER — SEMAGLUTIDE (1 MG/DOSE) 4 MG/3ML ~~LOC~~ SOPN: 1.0000 mg | PEN_INJECTOR | SUBCUTANEOUS | 3 refills | Status: DC

## 2022-02-20 NOTE — Patient Instructions (Addendum)
Take blood pressure readings twice a day for5- 7 and then periodically .  To ensure below 140/90   .Send in readings .   And we can decide on adjusting medication.perhaps .    Plan rov in 3 months   We will try increasing to  1 mg  dosing.

## 2022-02-21 ENCOUNTER — Inpatient Hospital Stay: Payer: Medicare Other | Attending: Internal Medicine

## 2022-02-21 VITALS — BP 143/97 | HR 72 | Temp 98.2°F | Resp 20

## 2022-02-21 DIAGNOSIS — D509 Iron deficiency anemia, unspecified: Secondary | ICD-10-CM | POA: Insufficient documentation

## 2022-02-21 DIAGNOSIS — Z859 Personal history of malignant neoplasm, unspecified: Secondary | ICD-10-CM | POA: Diagnosis not present

## 2022-02-21 DIAGNOSIS — C801 Malignant (primary) neoplasm, unspecified: Secondary | ICD-10-CM

## 2022-02-21 DIAGNOSIS — D500 Iron deficiency anemia secondary to blood loss (chronic): Secondary | ICD-10-CM

## 2022-02-21 DIAGNOSIS — Z95828 Presence of other vascular implants and grafts: Secondary | ICD-10-CM

## 2022-02-21 MED ORDER — SODIUM CHLORIDE 0.9% FLUSH
10.0000 mL | Freq: Once | INTRAVENOUS | Status: AC | PRN
  Administered 2022-02-21: 10 mL

## 2022-02-21 MED ORDER — ALTEPLASE 2 MG IJ SOLR
2.0000 mg | Freq: Once | INTRAMUSCULAR | Status: AC | PRN
  Administered 2022-02-21: 2 mg
  Filled 2022-02-21: qty 2

## 2022-02-21 MED ORDER — SODIUM CHLORIDE 0.9 % IV SOLN
200.0000 mg | Freq: Once | INTRAVENOUS | Status: AC
  Administered 2022-02-21: 200 mg via INTRAVENOUS
  Filled 2022-02-21: qty 200

## 2022-02-21 MED ORDER — HEPARIN SOD (PORK) LOCK FLUSH 100 UNIT/ML IV SOLN
500.0000 [IU] | Freq: Once | INTRAVENOUS | Status: AC | PRN
  Administered 2022-02-21: 500 [IU]

## 2022-02-21 MED ORDER — SODIUM CHLORIDE 0.9 % IV SOLN: Freq: Once | INTRAVENOUS | Status: AC

## 2022-02-21 NOTE — Progress Notes (Signed)
Accessed port for Venofer infusion. Was able to obtain blood return but was very sluggish and slow.  When flushing blood back into port resistance was met and blood was coming back from port into tubing.  Port was deaccessed and PIV was obtained

## 2022-02-21 NOTE — Patient Instructions (Signed)

## 2022-02-21 NOTE — Progress Notes (Signed)
Port was reaccessed and cathflo placed.  After 30 minute wait blood return noted but very sluggish.  Left cathflo in for another 30 minutes with blood return noted but still sluggish.  Flushed with saline and heparin but still had resistance while pulsating.

## 2022-03-02 ENCOUNTER — Other Ambulatory Visit: Payer: Self-pay | Admitting: Internal Medicine

## 2022-03-11 ENCOUNTER — Telehealth: Payer: Self-pay | Admitting: *Deleted

## 2022-03-11 ENCOUNTER — Other Ambulatory Visit: Payer: Self-pay | Admitting: *Deleted

## 2022-03-11 ENCOUNTER — Ambulatory Visit (HOSPITAL_BASED_OUTPATIENT_CLINIC_OR_DEPARTMENT_OTHER): Payer: Medicare Other | Admitting: Student in an Organized Health Care Education/Training Program

## 2022-03-11 DIAGNOSIS — F411 Generalized anxiety disorder: Secondary | ICD-10-CM

## 2022-03-11 DIAGNOSIS — F331 Major depressive disorder, recurrent, moderate: Secondary | ICD-10-CM | POA: Diagnosis not present

## 2022-03-11 DIAGNOSIS — F4312 Post-traumatic stress disorder, chronic: Secondary | ICD-10-CM | POA: Diagnosis not present

## 2022-03-11 DIAGNOSIS — Z95828 Presence of other vascular implants and grafts: Secondary | ICD-10-CM

## 2022-03-11 NOTE — Telephone Encounter (Signed)
PAtient called stating that she has recently had issues with her portacath such as resistance when flushing and lack of blood return.  Port is 61 years old.  DR Marin Olp notified.  Wants port to be evaluated by IR.  Orders placed.  Patient notified.

## 2022-03-11 NOTE — Progress Notes (Signed)
[facility] [place] [address] Dept: [phone] Dept Fax: [phone]  Psychotherapy Progress Note  Patient ID: [name], female  DOB: Aug 01, 1960, 61 y.o.  MRN: [id number]  03/11/2022 Start time: 1:03 PM End time: 1:53 PM  Method of Visit: Face-to-Face  Present: patient and Dr. Bobby Rumpf  Current Concerns:  She reports that the last few weeks have been a mix of both good and bad.  She reports that the reconstruction of her house has finally started.  She reports that her calling and emailing her insurance company she did not hear anything for 3 weeks and then was just sent a check.  She reports she finds this annoying but is glad that the work is finally started.  She also reports that she has moved on from the issue with her son.  She reports that a significant thing did happen a few weeks ago.  She reports that she has been on the board of an [employer] and that they have been working on their strategic planning for the next 3 to 5 years.  She reports that while she did not think it was a good plan she would still go with whatever the board decided to do.  She reports that when the time came developed for this she voted against it because she felt it did not do enough to uplift and support marginalize groups.  She states that after this the present board asked her to resign.  She reports that she has been a part of this group for 22 years and given a lot to this organization.  She reports that she would intentionally write a response email and then wait an hour and a repeat it and ended it before signing it.  Discussed with her and shows incredible strength and restraints and the ability to not simply respond with the first thing that came to her mind.  She also reports that last week she did not go to the gym.  She reported she did go to the dog park but not often.  Discussed with  her that this is most likely an after effect of what recently happened to her.  She also reports that she has been doing a lot of work on improving her health.  She reports she has lost 40 pounds in the last year.  She reports that her stamina is better and she knows she is in better shape but she is very distraught by the fact that she has loose skin now due to her age.  She does report that ultimately she does feel healthier overall which she admits is the more important thing.  She also reports that her Port-A-Cath has clogged off and is no longer accessible.  She reports that she is going to contact her oncologist about what to do next.  She reports that the most pressing matter right now is getting her disability paperwork done.  She reports she plans to do this in the next few days.  She is concerned about her lack of motivation in doing things.  She reports this will be her main goal and encouraged her to call up on that strength that she does have to get this done.  Prior to leaving the appointment she confirmed she was in a stable and safe mindset.  She report no SI, HI, or AVH.   Current Symptoms: Anxiety, Depressed Mood, Organization problem, and Peer problems  Psychiatric  Specialty Exam: General Appearance: Casual and Fairly Groomed  Eye Contact:  Good  Speech:  Clear and Coherent and Normal Rate  Volume:  Normal  Mood:  Anxious and Dysphoric  Affect:  Congruent  Thought Process:  Coherent  Orientation:  Full (Time, Place, and Person)  Thought Content:  WDL and Logical  Suicidal Thoughts:  No  Homicidal Thoughts:  No  Memory:  Immediate;   Good Recent;   Good  Judgement:  Good  Insight:  Good  Psychomotor Activity:  Normal  Concentration:  Concentration: Good and Attention Span: Good  Recall:  Good  Fund of Knowledge:Good  Language: Good  Akathisia:  Negative  Handed:  Right  AIMS (if indicated):  not done  Assets:  Communication Skills Desire for  Improvement Housing Resilience  ADL's:  Intact  Cognition: WNL  Sleep:  Good     Diagnosis: MDD, GAD,  chronic PTSD  Anticipated Frequency of Visits: weekly Anticipated Length of Treatment Episode: 12-16  Short Term Goals/Goals for Treatment Session: Manage anxiety, pay bills/do paperwork Progress Towards Goals: Progressing  Treatment Intervention: Insight-oriented therapy, Relaxation training, and Supportive therapy  Medical Necessity: Prevented onset or worsening of patient condition  Assessment Tools:    12/31/2021    3:04 PM 11/20/2021   11:36 AM 05/10/2021    2:02 PM  Depression screen PHQ 2/9  Decreased Interest 3 2 1   Down, Depressed, Hopeless 3 0 1  PHQ - 2 Score 6 2 2   Altered sleeping 3 3 3   Tired, decreased energy 3 2 2   Change in appetite 0 2 1  Feeling bad or failure about yourself  0 0 1  Trouble concentrating 3 2 1   Moving slowly or fidgety/restless 0 0 1  Suicidal thoughts 0 0 0  PHQ-9 Score 15 11 11   Difficult doing work/chores Extremely dIfficult Extremely dIfficult Not difficult at all   No flowsheet data found.   Collaboration of Care:   Patient/Guardian was advised Release of Information must be obtained prior to any record release in order to collaborate their care with an outside provider. Patient/Guardian was advised if they have not already done so to contact the registration department to sign all necessary forms in order for [place] to release information regarding their care.   Consent: Patient/Guardian gives verbal consent for treatment and assignment of benefits for services provided during this visit. Patient/Guardian expressed understanding and agreed to proceed.   Plan: She will focus on getting her Disability paperwork done as this is the most pressing issue.  She is showing significant inner strength through her handling of this stress the last few weeks.  Prior to leaving the appointment she confirmed she was in a stable and safe mindset.   She report no SI, HI, or AVH.   Briant Cedar, MD 03/11/2022

## 2022-03-13 ENCOUNTER — Telehealth: Payer: Self-pay | Admitting: *Deleted

## 2022-03-13 ENCOUNTER — Ambulatory Visit (HOSPITAL_COMMUNITY)
Admission: RE | Admit: 2022-03-13 | Discharge: 2022-03-13 | Disposition: A | Discharge status: Home / Self Care | Payer: Medicare Other | Source: Ambulatory Visit | Attending: Hematology & Oncology | Admitting: Hematology & Oncology

## 2022-03-13 DIAGNOSIS — Z452 Encounter for adjustment and management of vascular access device: Secondary | ICD-10-CM | POA: Diagnosis not present

## 2022-03-13 DIAGNOSIS — Z95828 Presence of other vascular implants and grafts: Secondary | ICD-10-CM

## 2022-03-13 HISTORY — PX: IR CV LINE INJECTION: IMG2294

## 2022-03-13 MED ORDER — IOHEXOL 300 MG/ML  SOLN
50.0000 mL | Freq: Once | INTRAMUSCULAR | Status: AC | PRN
  Administered 2022-03-13: 15 mL via INTRAVENOUS

## 2022-03-13 MED ORDER — HEPARIN SOD (PORK) LOCK FLUSH 100 UNIT/ML IV SOLN
INTRAVENOUS | Status: AC
  Administered 2022-03-13: 500 [IU] via INTRAVENOUS
  Filled 2022-03-13: qty 5

## 2022-03-13 NOTE — Telephone Encounter (Signed)
Message received from patient requesting a call back regarding port.  Call placed back to patient and patient states that she had her port checked today in IR at [facility]. She states that IR recommends she have TPA placed again and if that does not work, port will be replaced.  Pt is requesting to have TPA placed at [facility].  Message sent to scheduling.

## 2022-03-19 ENCOUNTER — Ambulatory Visit (HOSPITAL_BASED_OUTPATIENT_CLINIC_OR_DEPARTMENT_OTHER): Payer: Medicare Other | Admitting: Student in an Organized Health Care Education/Training Program

## 2022-03-19 DIAGNOSIS — F331 Major depressive disorder, recurrent, moderate: Secondary | ICD-10-CM

## 2022-03-19 DIAGNOSIS — F411 Generalized anxiety disorder: Secondary | ICD-10-CM | POA: Diagnosis not present

## 2022-03-19 DIAGNOSIS — F4312 Post-traumatic stress disorder, chronic: Secondary | ICD-10-CM | POA: Diagnosis not present

## 2022-03-19 NOTE — Progress Notes (Signed)
  [facility] [place] [address] Dept: [phone] Dept Fax: [phone]  Psychotherapy Progress Note  Patient ID: [name], female  DOB: 1960/07/03, 62 y.o.  MRN: [id number]  03/19/2022 Start time: 2:16 PM End time: 2:50 PM  Method of Visit: Face-to-Face  Present: patient  Current Concerns: ***  Current Symptoms: {Current Symptoms:[phone]}  Psychiatric Specialty Exam: General Appearance: {Appearance:22683}  Eye Contact:  {BHH EYE CONTACT:22684}  Speech:  {Speech:22685}  Volume:  {Volume (PAA):22686}  Mood:  {BHH MOOD:22306}  Affect:  {Affect (PAA):22687}  Thought Process:  {Thought Process (PAA):22688}  Orientation:  {BHH ORIENTATION (PAA):22689}  Thought Content:  {Thought Content:22690}  Suicidal Thoughts:  {ST/HT (PAA):22692}  Homicidal Thoughts:  {ST/HT (PAA):22692}  Memory:  {BHH MEMORY:22881}  Judgement:  {Judgement (PAA):22694}  Insight:  {Insight (PAA):22695}  Psychomotor Activity:  {Psychomotor (PAA):22696}  Concentration:  {Concentration:21399}  Recall:  {BHH GOOD/FAIR/POOR:22877}  Fund of Knowledge:{BHH GOOD/FAIR/POOR:22877}  Language: {BHH GOOD/FAIR/POOR:22877}  Akathisia:  {BHH YES OR NO:22294}  Handed:  {Handed:22697}  AIMS (if indicated):  {Desc; done/not:10129}  Assets:  {Assets (PAA):22698}  ADL's:  {BHH WLN'L:89211}  Cognition: {chl bhh cognition:304700322}  Sleep:  {BHH GOOD/FAIR/POOR:22877}     Diagnosis: ***  Anticipated Frequency of Visits: *** Anticipated Length of Treatment Episode: ***  Short Term Goals/Goals for Treatment Session: *** Progress Towards Goals: {Progress Towards Goals:21014066}  Treatment Intervention: {Treatment [phone]}  Medical Necessity: {Medical Necessity:210140004}  Assessment Tools:    12/31/2021    3:04 PM 11/20/2021   11:36 AM 05/10/2021    2:02 PM  Depression screen PHQ 2/9   Decreased Interest 3 2 1   Down, Depressed, Hopeless 3 0 1  PHQ - 2 Score 6 2 2   Altered sleeping 3 3 3   Tired, decreased energy 3 2 2   Change in appetite 0 2 1  Feeling bad or failure about yourself  0 0 1  Trouble concentrating 3 2 1   Moving slowly or fidgety/restless 0 0 1  Suicidal thoughts 0 0 0  PHQ-9 Score 15 11 11   Difficult doing work/chores Extremely dIfficult Extremely dIfficult Not difficult at all      Collaboration of Care: {BH OP Collaboration of Care:21014065}  Patient/Guardian was advised Release of Information must be obtained prior to any record release in order to collaborate their care with an outside provider. Patient/Guardian was advised if they have not already done so to contact the registration department to sign all necessary forms in order for [place] to release information regarding their care.   Consent: Patient/Guardian gives verbal consent for treatment and assignment of benefits for services provided during this visit. Patient/Guardian expressed understanding and agreed to proceed.   Plan: ***  Briant Cedar, MD 03/19/2022

## 2022-03-22 ENCOUNTER — Inpatient Hospital Stay: Payer: Medicare Other | Attending: Internal Medicine

## 2022-03-22 VITALS — BP 120/91 | HR 72 | Temp 98.1°F | Resp 19

## 2022-03-22 DIAGNOSIS — Z859 Personal history of malignant neoplasm, unspecified: Secondary | ICD-10-CM | POA: Insufficient documentation

## 2022-03-22 DIAGNOSIS — Z452 Encounter for adjustment and management of vascular access device: Secondary | ICD-10-CM | POA: Diagnosis not present

## 2022-03-22 DIAGNOSIS — Z95828 Presence of other vascular implants and grafts: Secondary | ICD-10-CM

## 2022-03-22 MED ORDER — HEPARIN SOD (PORK) LOCK FLUSH 100 UNIT/ML IV SOLN
500.0000 [IU] | Freq: Once | INTRAVENOUS | Status: AC
  Administered 2022-03-22: 500 [IU] via INTRAVENOUS

## 2022-03-22 MED ORDER — SODIUM CHLORIDE 0.9% FLUSH
10.0000 mL | Freq: Once | INTRAVENOUS | Status: AC
  Administered 2022-03-22: 10 mL via INTRAVENOUS

## 2022-03-22 NOTE — Patient Instructions (Signed)
Implanted [facility] Guide An implanted port is a device that is placed under the skin. It is usually placed in the chest. The device may vary based on the need. Implanted ports can be used to give IV medicine, to take blood, or to give fluids. You may have an implanted port if: You need IV medicine that would be irritating to the small veins in your hands or arms. You need IV medicines, such as chemotherapy, for a long period of time. You need IV nutrition for a long period of time. You may have fewer limitations when using a port than you would if you used other types of long-term IVs. You will also likely be able to return to normal activities after your incision heals. An implanted port has two main parts: Reservoir. The reservoir is the part where a needle is inserted to give medicines or draw blood. The reservoir is round. After the port is placed, it appears as a small, raised area under your skin. Catheter. The catheter is a small, thin tube that connects the reservoir to a vein. Medicine that is inserted into the reservoir goes into the catheter and then into the vein. How is my port accessed? To access your port: A numbing cream may be placed on the skin over the port site. Your health care provider will put on a mask and sterile gloves. The skin over your port will be cleaned carefully with a germ-killing soap and allowed to dry. Your health care provider will gently pinch the port and insert a needle into it. Your health care provider will check for a blood return to make sure the port is in the vein and is still working (patent). If your port needs to remain accessed to get medicine continuously (constant infusion), your health care provider will place a clear bandage (dressing) over the needle site. The dressing and needle will need to be changed every week, or as told by your health care provider. What is flushing? Flushing helps keep the port working. Follow instructions from your  health care provider about how and when to flush the port. Ports are usually flushed with saline solution or a medicine called heparin. The need for flushing will depend on how the port is used: If the port is only used from time to time to give medicines or draw blood, the port may need to be flushed: Before and after medicines have been given. Before and after blood has been drawn. As part of routine maintenance. Flushing may be recommended every 4-6 weeks. If a constant infusion is running, the port may not need to be flushed. Throw away any syringes in a disposal container that is meant for sharp items (sharps container). You can buy a sharps container from a pharmacy, or you can make one by using an empty hard plastic bottle with a cover. How long will my port stay implanted? The port can stay in for as long as your health care provider thinks it is needed. When it is time for the port to come out, a surgery will be done to remove it. The surgery will be similar to the procedure that was done to put the port in. Follow these instructions at home: Caring for your port and port site Flush your port as told by your health care provider. If you need an infusion over several days, follow instructions from your health care provider about how to take care of your port site. Make sure you: Change your  dressing as told by your health care provider. Wash your hands with soap and water for at least 20 seconds before and after you change your dressing. If soap and water are not available, use alcohol-based hand sanitizer. Place any used dressings or infusion bags into a plastic bag. Throw that bag in the trash. Keep the dressing that covers the needle clean and dry. Do not get it wet. Do not use scissors or sharp objects near the infusion tubing. Keep any external tubes clamped, unless they are being used. Check your port site every day for signs of infection. Check for: Redness, swelling, or  pain. Fluid or blood. Warmth. Pus or a bad smell. Protect the skin around the port site. Avoid wearing bra straps that rub or irritate the site. Protect the skin around your port from seat belts. Place a soft pad over your chest if needed. Bathe or shower as told by your health care provider. The site may get wet as long as you are not actively receiving an infusion. General instructions  Return to your normal activities as told by your health care provider. Ask your health care provider what activities are safe for you. Carry a medical alert card or wear a medical alert bracelet at all times. This will let health care providers know that you have an implanted port in case of an emergency. Where to find more information American Cancer Society: www.cancer.[place] of Clinical Oncology: www.cancer.net Contact a health care provider if: You have a fever or chills. You have redness, swelling, or pain at the port site. You have fluid or blood coming from your port site. Your incision feels warm to the touch. You have pus or a bad smell coming from the port site. Summary Implanted ports are usually placed in the chest for long-term IV access. Follow instructions from your health care provider about flushing the port and changing bandages (dressings). Take care of the area around your port by avoiding clothing that puts pressure on the area, and by watching for signs of infection. Protect the skin around your port from seat belts. Place a soft pad over your chest if needed. Contact a health care provider if you have a fever or you have redness, swelling, pain, fluid, or a bad smell at the port site. This information is not intended to replace advice given to you by your health care provider. Make sure you discuss any questions you have with your health care provider. Document Revised: 10/31/2020 Document Reviewed: 10/31/2020 Elsevier Patient Education  [employer].

## 2022-03-22 NOTE — Progress Notes (Signed)
Port accessed for flush.  Noted that port does require slightly harder push than most ports to place saline. Immediate blood return, noted to drawback slightly slower than most ports.  Was able to flush and place heparin without difficulty.

## 2022-03-26 ENCOUNTER — Other Ambulatory Visit: Payer: Self-pay | Admitting: Family Medicine

## 2022-03-26 MED ORDER — AMPHETAMINE-DEXTROAMPHET ER 20 MG PO CP24: 20.0000 mg | ORAL_CAPSULE | Freq: Every day | ORAL | 0 refills | Status: DC

## 2022-03-26 NOTE — Telephone Encounter (Signed)
Pt is requesting a refill for amphetamine-dextroamphetamine (ADDERALL XR) 20 MG 24 hr capsule.  Pharmacy: [facility] [phone]

## 2022-03-26 NOTE — Telephone Encounter (Signed)
Per drug registry, last refilled 02/07/22 #30. Last seen 11/27/21 and next f/u 06/05/22.

## 2022-03-27 ENCOUNTER — Ambulatory Visit (HOSPITAL_COMMUNITY): Payer: Medicare Other | Admitting: Student in an Organized Health Care Education/Training Program

## 2022-04-02 ENCOUNTER — Telehealth (HOSPITAL_BASED_OUTPATIENT_CLINIC_OR_DEPARTMENT_OTHER): Payer: Medicare Other | Admitting: Student in an Organized Health Care Education/Training Program

## 2022-04-02 DIAGNOSIS — F411 Generalized anxiety disorder: Secondary | ICD-10-CM | POA: Diagnosis not present

## 2022-04-02 DIAGNOSIS — F4312 Post-traumatic stress disorder, chronic: Secondary | ICD-10-CM | POA: Diagnosis not present

## 2022-04-02 DIAGNOSIS — F331 Major depressive disorder, recurrent, moderate: Secondary | ICD-10-CM | POA: Diagnosis not present

## 2022-04-02 NOTE — Progress Notes (Signed)
[facility] [place] [address] Dept: [phone] Dept Fax: [phone]  Psychotherapy Progress Note  Patient ID: [name], female  DOB: 1961/04/27, 61 y.o.  MRN: [id number]  04/02/2022 Start time: 1:30 PM End time: 2:16 PM  Method of Visit: Video: Virtual Visit via Video Encounter:  I connected with Meela D Saxon-Mace on 04/02/22 at  2:00 PM EST by a video enabled telemedicine application and verified that I am speaking with the correct person using two identifiers.   Location: Patient: Home Provider: [facility]   I discussed the limitations of evaluation and management by telemedicine and the availability of in person appointments. The patient expressed understanding and agreed to proceed.  I discussed the assessment and treatment plan with the patient. The patient was provided an opportunity to ask questions and all were answered. The patient agreed with the plan and demonstrated an understanding of the instructions.   The patient was advised to call back or seek an in-person evaluation if the symptoms worsen or if the condition fails to improve as anticipated.  I provided 46 minutes of non-face-to-face time during this encounter.  Present: patient  Current Concerns:  She started off this session by discussing concerns over her port.  She reports that still having issues with it.  She reports that she went to have a tPA flush done but that it was not done because there was some drawback that they were able to do on it.  She reports that she has been discussing with her heme/oncology doctor about how to proceed.  She reports that she would like to get the port replaced.  She reports that if the port is taken out she has been told that that site would be unusable again and so if she ever needed a port placed again it would have to be placed in the more difficult position on the left  side.  She reports that she does have some concern that her cancer could return necessitating the need of a port.  However, she reports that she does have bad veins and that whenever she is needing an IV in the past it usually takes 3 or 4 different people attempting and oftentimes still leads to multiple veins being blown.  She reports that her chronic issues with anemia require iron infusions approximately 2 times a year.  She reports she is just concerned that she is being unreasonable with wanting a new port placed.  Discussed with her that her concerns were valid and that given her history of poor remains and the known need to having IVs placed as well as unforeseen/emergency needs continuing to have that easy access that a port provides is not unreasonable.  She then reports that for her it easy to get on a roll with things that can be either good or bad.  She reports that it has been a few days since she has gone to the gym and so feels like she has to go today so that she breaks that cycle.    She then discussed she has been thinking of building a custom closet for a while now.  She reports that she spent over a year shopping online for ones that she could purchase and assemble but none of them ever fit her needs sufficiently.  She reports that she has decided she plans to go ahead and build what she wants.  She then discussed that she is not  planning on visiting her family this Thanksgiving.  She reports that there is a lot of stress involved in going there and she does not think it is the right decision for her at this time.  She reports that he is being held at her brother's this year and she does not get along with her sister-in-law and is not getting along with the brother right now either.  She reports that she may just be using this last part of the further excuse.  She reports she was considering going up on the weekend to help her mother put up Christmas decorations because that is when she  likes to do it.  However, she reports that after further consideration she does she cannot control what others do and that 3 of her brothers live near her parents or they could hire someone to put the Christmas decorations if her mother really needs them to be put up this weekend.  She reports that in the past when she has not gone she has felt guilty but that this time she does not feel guilty and thinks that this is the right decision for her to not go this year.  When asked about the disability paperwork she reports that it still has not been done.  She reports that she did reach out to her son's ex-girlfriend but that she told the patient she was not in a position to help at this time and it would not be until December that she would be available to help.  She reports that she knows she just needs to get this done and so plans to get it done this weekend especially since she is not going to her family's.  Prior to the end of the call she confirmed she was in a stable and safe mindset.  She reports no SI, HI, or AVH.   Current Symptoms: Anxiety, Depressed Mood, Family Stress, and Organization problem  Psychiatric Specialty Exam: General Appearance: Casual and Fairly Groomed  Eye Contact:  Good  Speech:  Clear and Coherent and Normal Rate  Volume:  Normal  Mood:   "ok"  Affect:  Appropriate and Congruent  Thought Process:  Coherent and Goal Directed  Orientation:  Full (Time, Place, and Person)  Thought Content:  WDL and Logical  Suicidal Thoughts:  No  Homicidal Thoughts:  No  Memory:  Immediate;   Good  Judgement:  Good  Insight:  Good  Psychomotor Activity:  Normal  Concentration:  Concentration: Good and Attention Span: Good  Recall:  Good  Fund of Knowledge:Good  Language: Good  Akathisia:  Negative  Handed:  Right  AIMS (if indicated):  not done  Assets:  Communication Skills Desire for Improvement Housing Resilience  ADL's:  Intact  Cognition: WNL  Sleep:  Fair      Diagnosis: MDD, GAD, chronic PTSD    Anticipated Frequency of Visits: weekly  Anticipated Length of Treatment Episode: 12-16   Short Term Goals/Goals for Treatment Session: Complete Disability paperwork Progress Towards Goals: Progressing  Treatment Intervention: Behavior therapy, Cognitive therapy, Insight-oriented therapy, Relaxation training, and Supportive therapy  Medical Necessity: Prevented onset or worsening of patient condition  Assessment Tools:    12/31/2021    3:04 PM 11/20/2021   11:36 AM 05/10/2021    2:02 PM  Depression screen PHQ 2/9  Decreased Interest 3 2 1   Down, Depressed, Hopeless 3 0 1  PHQ - 2 Score 6 2 2   Altered sleeping 3 3 3   Tired, decreased  energy 3 2 2   Change in appetite 0 2 1  Feeling bad or failure about yourself  0 0 1  Trouble concentrating 3 2 1   Moving slowly or fidgety/restless 0 0 1  Suicidal thoughts 0 0 0  PHQ-9 Score 15 11 11   Difficult doing work/chores Extremely dIfficult Extremely dIfficult Not difficult at all      Collaboration of Care: Other provider involved in patient's care AEB Dr. Bobby Rumpf  Patient/Guardian was advised Release of Information must be obtained prior to any record release in order to collaborate their care with an outside provider. Patient/Guardian was advised if they have not already done so to contact the registration department to sign all necessary forms in order for [place] to release information regarding their care.   Consent: Patient/Guardian gives verbal consent for treatment and assignment of benefits for services provided during this visit. Patient/Guardian expressed understanding and agreed to proceed.   Plan: She still has her disability paperwork to complete, however, she did reach out to her son's ex-girlfriend and she reported she would be unable to help until December.  Due to this and her plan to not visit her family over the holiday she will get the paperwork done over this weekend.  This  continues to be one of her more pressing issues and topic that we will continue to address.  Prior to the end of the call she confirmed she was in a stable and safe mindset.  She reports no SI, HI, or AVH.  Briant Cedar, MD 04/02/2022

## 2022-04-09 ENCOUNTER — Telehealth (HOSPITAL_BASED_OUTPATIENT_CLINIC_OR_DEPARTMENT_OTHER): Payer: Medicare Other | Admitting: Student in an Organized Health Care Education/Training Program

## 2022-04-09 DIAGNOSIS — F331 Major depressive disorder, recurrent, moderate: Secondary | ICD-10-CM

## 2022-04-09 DIAGNOSIS — F4312 Post-traumatic stress disorder, chronic: Secondary | ICD-10-CM

## 2022-04-09 DIAGNOSIS — F411 Generalized anxiety disorder: Secondary | ICD-10-CM

## 2022-04-09 NOTE — Progress Notes (Signed)
[facility] [place] [address] Dept: [phone] Dept Fax: [phone]  Psychotherapy Progress Note  Patient ID: [name], female  DOB: Oct 27, 1960, 61 y.o.  MRN: [id number]  04/09/2022 Start time: 1:07 PM End time: 1:59 PM  Method of Visit: Telephone: Virtual Visit via Telephone Encounter:  I connected with [name] on 04/09/22 at  1:00 PM EST by telephone and verified that I am speaking with the correct person using two identifiers.  Location: Patient: Home Provider: [facility]   I discussed the limitations, risks, security and privacy concerns of performing an evaluation and management service by telephone and the availability of in person appointments. I also discussed with the patient that there may be a patient responsible charge related to this service. The patient expressed understanding and agreed to proceed.   I discussed the assessment and treatment plan with the patient. The patient was provided an opportunity to ask questions and all were answered. The patient agreed with the plan and demonstrated an understanding of the instructions.   The patient was advised to call back or seek an in-person evaluation if the symptoms worsen or if the condition fails to improve as anticipated.  I provided 46 minutes of non-face-to-face time during this encounter.  Present: patient  Current Concerns: ***  Current Symptoms: Anxiety, Depressed Mood, and Organization problem  Psychiatric Specialty Exam: General Appearance: Could not assess due to Phone Visit  Eye Contact:  Could not assess due to Phone Visit  Speech:  Clear and Coherent and Normal Rate  Volume:  Normal  Mood:  Anxious  Affect:  Congruent  Thought Process:  Coherent and Goal Directed  Orientation:  Full (Time, Place, and Person)  Thought Content:  WDL and Logical  Suicidal Thoughts:  No  Homicidal  Thoughts:  No  Memory:  Immediate;   Good  Judgement:  Good  Insight:  Good  Psychomotor Activity:  Could not assess due to Phone Visit  Concentration:  Concentration: Good and Attention Span: Good  Recall:  Good  Fund of Knowledge:Good  Language: Good  Akathisia:  Could not assess due to Phone Visit  Handed:  Right  AIMS (if indicated):  not done  Assets:  Communication Skills Desire for Improvement Housing Resilience  ADL's:  Intact  Cognition: WNL  Sleep:  Good     Diagnosis: MDD, GAD,  chronic PTSD    Anticipated Frequency of Visits: weekly   Anticipated Length of Treatment Episode: 12-16   Short Term Goals/Goals for Treatment Session: Reach out to friend to help with organization for disability paperwork  Progress Towards Goals: Not Progressing  Treatment Intervention: Cognitive therapy, Insight-oriented therapy, and Supportive therapy  Medical Necessity: Prevented onset or worsening of patient condition  Assessment Tools:    12/31/2021    3:04 PM 11/20/2021   11:36 AM 05/10/2021    2:02 PM  Depression screen PHQ 2/9  Decreased Interest 3 2 1   Down, Depressed, Hopeless 3 0 1  PHQ - 2 Score 6 2 2   Altered sleeping 3 3 3   Tired, decreased energy 3 2 2   Change in appetite 0 2 1  Feeling bad or failure about yourself  0 0 1  Trouble concentrating 3 2 1   Moving slowly or fidgety/restless 0 0 1  Suicidal thoughts 0 0 0  PHQ-9 Score 15 11 11   Difficult doing work/chores Extremely dIfficult Extremely dIfficult Not difficult at all  Collaboration of Care:   Patient/Guardian was advised Release of Information must be obtained prior to any record release in order to collaborate their care with an outside provider. Patient/Guardian was advised if they have not already done so to contact the registration department to sign all necessary forms in order for [place] to release information regarding their care.   Consent: Patient/Guardian gives verbal consent for  treatment and assignment of benefits for services provided during this visit. Patient/Guardian expressed understanding and agreed to proceed.   Plan: Provided supportive/talk therapy.  She will reach back out to her son's ex girlfriend to assist her with her bills/paperwork.   Briant Cedar, MD 04/09/2022

## 2022-04-10 ENCOUNTER — Inpatient Hospital Stay: Payer: Medicare Other

## 2022-04-15 ENCOUNTER — Ambulatory Visit: Payer: Medicare Other | Admitting: Neurology

## 2022-04-16 ENCOUNTER — Telehealth (HOSPITAL_BASED_OUTPATIENT_CLINIC_OR_DEPARTMENT_OTHER): Payer: Medicare Other | Admitting: Student in an Organized Health Care Education/Training Program

## 2022-04-16 DIAGNOSIS — F4312 Post-traumatic stress disorder, chronic: Secondary | ICD-10-CM

## 2022-04-16 DIAGNOSIS — F411 Generalized anxiety disorder: Secondary | ICD-10-CM | POA: Diagnosis not present

## 2022-04-16 DIAGNOSIS — F331 Major depressive disorder, recurrent, moderate: Secondary | ICD-10-CM
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
## 2022-04-24 ENCOUNTER — Other Ambulatory Visit: Payer: Self-pay | Admitting: Hematology & Oncology

## 2022-04-24 DIAGNOSIS — C801 Malignant (primary) neoplasm, unspecified: Secondary | ICD-10-CM

## 2022-04-24 DIAGNOSIS — F4323 Adjustment disorder with mixed anxiety and depressed mood: Secondary | ICD-10-CM

## 2022-04-28 ENCOUNTER — Other Ambulatory Visit: Payer: Self-pay | Admitting: Internal Medicine

## 2022-04-28 ENCOUNTER — Other Ambulatory Visit: Payer: Self-pay | Admitting: Hematology & Oncology

## 2022-04-28 DIAGNOSIS — C801 Malignant (primary) neoplasm, unspecified: Secondary | ICD-10-CM

## 2022-04-29 ENCOUNTER — Other Ambulatory Visit: Payer: Self-pay

## 2022-04-29 DIAGNOSIS — G43109 Migraine with aura, not intractable, without status migrainosus: Secondary | ICD-10-CM

## 2022-04-29 DIAGNOSIS — H8113 Benign paroxysmal vertigo, bilateral: Secondary | ICD-10-CM

## 2022-04-29 DIAGNOSIS — H53143 Visual discomfort, bilateral: Secondary | ICD-10-CM

## 2022-04-29 DIAGNOSIS — G4701 Insomnia due to medical condition: Secondary | ICD-10-CM

## 2022-04-29 MED ORDER — DONEPEZIL HCL 10 MG PO TABS: ORAL_TABLET | ORAL | 1 refills | Status: DC

## 2022-05-13 ENCOUNTER — Other Ambulatory Visit: Payer: Self-pay | Admitting: Internal Medicine

## 2022-05-15 ENCOUNTER — Ambulatory Visit (INDEPENDENT_AMBULATORY_CARE_PROVIDER_SITE_OTHER): Payer: Medicare Other

## 2022-05-15 VITALS — Ht 68.0 in | Wt 178.0 lb

## 2022-05-15 DIAGNOSIS — Z0000 Encounter for general adult medical examination without abnormal findings: Secondary | ICD-10-CM | POA: Diagnosis not present

## 2022-05-15 NOTE — Patient Instructions (Addendum)
[name] , Thank you for taking time to come for your Medicare Wellness Visit. I appreciate your ongoing commitment to your health goals. Please review the following plan we discussed and let me know if I can assist you in the future.   These are the goals we discussed:  Goals       Chronic Care Management      Stay healthy (pt-stated)      Weight (lb) < 200 lb (90.7 kg)      Patient plans to change diet and increase exercise activity.        This is a list of the screening recommended for you and due dates:  Health Maintenance  Topic Date Due   COVID-19 Vaccine (5 - 2023-24 season) 05/31/2022*   Zoster (Shingles) Vaccine (1 of 2) 08/14/2022*   Screening for Lung Cancer  05/16/2023*   Flu Shot  03/06/2024*   Medicare Annual Wellness Visit  05/16/2023   Mammogram  08/21/2023   DTaP/Tdap/Td vaccine (4 - Td or Tdap) 01/15/2030   Colon Cancer Screening  03/20/2030   Hepatitis C Screening: USPSTF Recommendation to screen - Ages 18-79 yo.  Completed   HIV Screening  Completed   HPV Vaccine  Aged Out  *Topic was postponed. The date shown is not the original due date.    Advanced directives: Please bring a copy of your health care power of attorney and living will to the office to be added to your chart at your convenience.   Conditions/risks identified: None  Next appointment: Follow up in one year for your annual wellness visit.    Preventive Care 40-64 Years, Female Preventive care refers to lifestyle choices and visits with your health care provider that can promote health and wellness. What does preventive care include? A yearly physical exam. This is also called an annual well check. Dental exams once or twice a year. Routine eye exams. Ask your health care provider how often you should have your eyes checked. Personal lifestyle choices, including: Daily care of your teeth and gums. Regular physical activity. Eating a healthy diet. Avoiding tobacco and drug  use. Limiting alcohol use. Practicing safe sex. Taking low-dose aspirin daily starting at age 50. Taking vitamin and mineral supplements as recommended by your health care provider. What happens during an annual well check? The services and screenings done by your health care provider during your annual well check will depend on your age, overall health, lifestyle risk factors, and family history of disease. Counseling  Your health care provider may ask you questions about your: Alcohol use. Tobacco use. Drug use. Emotional well-being. Home and relationship well-being. Sexual activity. Eating habits. Work and work Statistician. Method of birth control. Menstrual cycle. Pregnancy history. Screening  You may have the following tests or measurements: Height, weight, and BMI. Blood pressure. Lipid and cholesterol levels. These may be checked every 5 years, or more frequently if you are over 59 years old. Skin check. Lung cancer screening. You may have this screening every year starting at age 40 if you have a 30-pack-year history of smoking and currently smoke or have quit within the past 15 years. Fecal occult blood test (FOBT) of the stool. You may have this test every year starting at age 23. Flexible sigmoidoscopy or colonoscopy. You may have a sigmoidoscopy every 5 years or a colonoscopy every 10 years starting at age 13. Hepatitis C blood test. Hepatitis B blood test. Sexually transmitted disease (STD) testing. Diabetes screening. This is done  by checking your blood sugar (glucose) after you have not eaten for a while (fasting). You may have this done every 1-3 years. Mammogram. This may be done every 1-2 years. Talk to your health care provider about when you should start having regular mammograms. This may depend on whether you have a family history of breast cancer. BRCA-related cancer screening. This may be done if you have a family history of breast, ovarian, tubal, or  peritoneal cancers. Pelvic exam and Pap test. This may be done every 3 years starting at age 22. Starting at age 66, this may be done every 5 years if you have a Pap test in combination with an HPV test. Bone density scan. This is done to screen for osteoporosis. You may have this scan if you are at high risk for osteoporosis. Discuss your test results, treatment options, and if necessary, the need for more tests with your health care provider. Vaccines  Your health care provider may recommend certain vaccines, such as: Influenza vaccine. This is recommended every year. Tetanus, diphtheria, and acellular pertussis (Tdap, Td) vaccine. You may need a Td booster every 10 years. Zoster vaccine. You may need this after age 49. Pneumococcal 13-valent conjugate (PCV13) vaccine. You may need this if you have certain conditions and were not previously vaccinated. Pneumococcal polysaccharide (PPSV23) vaccine. You may need one or two doses if you smoke cigarettes or if you have certain conditions. Talk to your health care provider about which screenings and vaccines you need and how often you need them. This information is not intended to replace advice given to you by your health care provider. Make sure you discuss any questions you have with your health care provider. Document Released: 05/26/2015 Document Revised: 01/17/2016 Document Reviewed: 02/28/2015 Elsevier Interactive Patient Education  2017 [place] Prevention in the Home Falls can cause injuries. They can happen to people of all ages. There are many things you can do to make your home safe and to help prevent falls. What can I do on the outside of my home? Regularly fix the edges of walkways and driveways and fix any cracks. Remove anything that might make you trip as you walk through a door, such as a raised step or threshold. Trim any bushes or trees on the path to your home. Use bright outdoor lighting. Clear any walking  paths of anything that might make someone trip, such as rocks or tools. Regularly check to see if handrails are loose or broken. Make sure that both sides of any steps have handrails. Any raised decks and porches should have guardrails on the edges. Have any leaves, snow, or ice cleared regularly. Use sand or salt on walking paths during winter. Clean up any spills in your garage right away. This includes oil or grease spills. What can I do in the bathroom? Use night lights. Install grab bars by the toilet and in the tub and shower. Do not use towel bars as grab bars. Use non-skid mats or decals in the tub or shower. If you need to sit down in the shower, use a plastic, non-slip stool. Keep the floor dry. Clean up any water that spills on the floor as soon as it happens. Remove soap buildup in the tub or shower regularly. Attach bath mats securely with double-sided non-slip rug tape. Do not have throw rugs and other things on the floor that can make you trip. What can I do in the bedroom? Use night lights.  Make sure that you have a light by your bed that is easy to reach. Do not use any sheets or blankets that are too big for your bed. They should not hang down onto the floor. Have a firm chair that has side arms. You can use this for support while you get dressed. Do not have throw rugs and other things on the floor that can make you trip. What can I do in the kitchen? Clean up any spills right away. Avoid walking on wet floors. Keep items that you use a lot in easy-to-reach places. If you need to reach something above you, use a strong step stool that has a grab bar. Keep electrical cords out of the way. Do not use floor polish or wax that makes floors slippery. If you must use wax, use non-skid floor wax. Do not have throw rugs and other things on the floor that can make you trip. What can I do with my stairs? Do not leave any items on the stairs. Make sure that there are handrails  on both sides of the stairs and use them. Fix handrails that are broken or loose. Make sure that handrails are as long as the stairways. Check any carpeting to make sure that it is firmly attached to the stairs. Fix any carpet that is loose or worn. Avoid having throw rugs at the top or bottom of the stairs. If you do have throw rugs, attach them to the floor with carpet tape. Make sure that you have a light switch at the top of the stairs and the bottom of the stairs. If you do not have them, ask someone to add them for you. What else can I do to help prevent falls? Wear shoes that: Do not have high heels. Have rubber bottoms. Are comfortable and fit you well. Are closed at the toe. Do not wear sandals. If you use a stepladder: Make sure that it is fully opened. Do not climb a closed stepladder. Make sure that both sides of the stepladder are locked into place. Ask someone to hold it for you, if possible. Clearly mark and make sure that you can see: Any grab bars or handrails. First and last steps. Where the edge of each step is. Use tools that help you move around (mobility aids) if they are needed. These include: Canes. Walkers. Scooters. Crutches. Turn on the lights when you go into a dark area. Replace any light bulbs as soon as they burn out. Set up your furniture so you have a clear path. Avoid moving your furniture around. If any of your floors are uneven, fix them. If there are any pets around you, be aware of where they are. Review your medicines with your doctor. Some medicines can make you feel dizzy. This can increase your chance of falling. Ask your doctor what other things that you can do to help prevent falls. This information is not intended to replace advice given to you by your health care provider. Make sure you discuss any questions you have with your health care provider. Document Released: 02/23/2009 Document Revised: 10/05/2015 Document Reviewed:  06/03/2014 Elsevier Interactive Patient Education  2017 [employer].

## 2022-05-15 NOTE — Progress Notes (Signed)
Subjective:   [name] is a 62 y.o. female who presents for Medicare Annual (Subsequent) preventive examination.  Review of Systems    Virtual Visit via Telephone Note  I connected with  [name] on 05/15/22 at  1:45 PM EST by telephone and verified that I am speaking with the correct person using two identifiers.  Location: Patient: Home Provider: Office Persons participating in the virtual visit: patient/Nurse Health Advisor   I discussed the limitations, risks, security and privacy concerns of performing an evaluation and management service by telephone and the availability of in person appointments. The patient expressed understanding and agreed to proceed.  Interactive audio and video telecommunications were attempted between this nurse and patient, however failed, due to patient having technical difficulties OR patient did not have access to video capability.  We continued and completed visit with audio only.  Some vital signs may be absent or patient reported.   Criselda Peaches, LPN  Cardiac Risk Factors include: advanced age (>52mn, >>75women);hypertension     Objective:    Today's Vitals   05/15/22 1400  Weight: 178 lb (80.7 kg)  Height: 5' 8" (1.727 m)   Body mass index is 27.06 kg/m.     05/15/2022    2:13 PM 03/22/2022    1:07 PM 02/21/2022    1:07 PM 02/08/2022   11:55 AM 12/04/2021    2:38 PM 10/02/2021    2:05 PM 08/09/2021    3:10 PM  Advanced Directives  Does Patient Have a Medical Advance Directive? Yes Yes Yes Yes No No No  Type of AParamedicof AFreedomLiving will   [place] will     Does patient want to make changes to medical advance directive?  No - Patient declined No - Patient declined  No - Patient declined  No - Patient declined  Copy of [place] Chart? No - copy requested        Would patient like information on creating a medical advance directive?        No - Patient declined    Current Medications (verified) Outpatient Encounter Medications as of 05/15/2022  Medication Sig   amLODipine (NORVASC) 10 MG tablet TAKE 1 TABLET(10 MG) BY MOUTH DAILY   amphetamine-dextroamphetamine (ADDERALL XR) 20 MG 24 hr capsule Take 1 capsule (20 mg total) by mouth daily.   aspirin 81 MG tablet Take 81 mg by mouth daily. 2 TABS IN AM   butalbital-acetaminophen-caffeine (FIORICET) 50-325-40 MG tablet Take 1 tablet by mouth every 6 (six) hours as needed for headache (migraine abortion). TAKE 1 TABLET BY MOUTH EVERY 6 HOURS AS NEEDED FOR HEADACHE further refills should come from Neurology team .   calcium carbonate (TUMS - DOSED IN MG ELEMENTAL CALCIUM) 500 MG chewable tablet Chew 1 tablet by mouth daily as needed for indigestion or heartburn.   diclofenac (VOLTAREN) 75 MG EC tablet TK 1 T PO BID AFTER MEALS FOR INFLAMMATION / PAIN / SWELLING PRN ONLY   donepezil (ARICEPT) 10 MG tablet TAKE 1 TABLET(10 MG) BY MOUTH EVERY MORNING   DULoxetine (CYMBALTA) 60 MG capsule TAKE 1 CAPSULE(60 MG) BY MOUTH DAILY   hydrOXYzine (VISTARIL) 25 MG capsule Take 1 capsule (25 mg total) by mouth 3 (three) times daily as needed.   Multiple Vitamin (MULTIVITAMIN WITH MINERALS) TABS tablet Take 1 tablet by mouth daily.   orphenadrine (NORFLEX) 100 MG tablet TAKE ONE TABLET BY MOUTH TWICE DAILY  AS NEEDED FOR MUSCLE SPASMS   pregabalin (LYRICA) 200 MG capsule TAKE 1 CAPSULE(200 MG) BY MOUTH TWICE DAILY   rosuvastatin (CRESTOR) 5 MG tablet Take 2 tablets (10 mg total) by mouth daily.   Semaglutide, 1 MG/DOSE, 4 MG/3ML SOPN Inject 1 mg as directed once a week.   spironolactone (ALDACTONE) 25 MG tablet TAKE 1 TABLET(25 MG) BY MOUTH DAILY   Vitamin D, Ergocalciferol, (DRISDOL) 1.25 MG (50000 UNIT) CAPS capsule TAKE 1 CAPSULE BY MOUTH 1 TIME A WEEK   zolpidem (AMBIEN) 5 MG tablet TAKE 1 TO 2 TABLETS(5 TO 10 MG) BY MOUTH EVERY NIGHT AT BEDTIME AS NEEDED   No facility-administered  encounter medications on file as of 05/15/2022.    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   Clotting disorder ([place])    GERD (gastroesophageal reflux disease)    HLD (hyperlipidemia)    Hx pulmonary embolism    Hypertension    Iron deficiency anemia due to chronic blood loss 11/04/2016   Metrorrhagia    Neuromuscular disorder ([facility])    Neuropathy    Obesity    Ovarian cancer ([place]) 04/2008   ChemoTherapy   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   Raynaud's syndrome    Right groin mass    Squamous cell carcinoma of skin    Ulcerative colitis ([place])    Vision abnormalities    Vitamin D deficiency    Past Surgical History:  Procedure Laterality Date   ABDOMINAL HYSTERECTOMY  05/2008   TAH/BSO   AUGMENTATION MAMMAPLASTY Bilateral 10+ years   BREAST BIOPSY Left 2020   BREAST BIOPSY Right    BREAST REDUCTION SURGERY  2000   CESAREAN SECTION     1991   COLONOSCOPY  2004; 12/07/10   hemorrhoids   ESOPHAGOGASTRODUODENOSCOPY     GASTRIC BYPASS  1979   IR CV LINE INJECTION  03/13/2022   REDUCTION MAMMAPLASTY Bilateral    TOENAIL EXCISION Bilateral    TONSILLECTOMY     Family History  Problem Relation Age of Onset   Pulmonary embolism Mother    Hypertension Father    Breast cancer Maternal Grandmother        diagnosed in early 69s   Hyperlipidemia Other    Breast cancer Sister 72   Colon cancer Neg Hx    Esophageal cancer Neg Hx    Rectal cancer Neg Hx    Stomach cancer Neg Hx    Social History   Socioeconomic History   Marital status: Divorced    Spouse name: Not on file   Number of children: 1   Years of education: Master's   Highest education level: Not  on file  Occupational History    Employer: UNEMPLOYED    Comment: disabled  Tobacco Use   Smoking status: Every Day    Packs/day: 1.00    Years: 37.00    Total pack years: 37.00    Types: Cigarettes    Start date: 06/16/1978   Smokeless tobacco: Never   Tobacco comments:    2- 7-16  STILL SMOKING  Vaping Use   Vaping Use: Never used  Substance and Sexual Activity  Alcohol use: Yes    Alcohol/week: 0.0 standard drinks of alcohol    Comment: rare, 3 weekly   Drug use: No   Sexual activity: Not Currently  Other Topics Concern   Not on file  Social History Narrative   Divorced   1 son /1 adopted adult son  2 dogs    Sales occupation on disability for now   Regular exercise-yes money stress   Daily caffeine use 2-3 and 2 cups of coffee   On since a security disability since 2012   Patient is right-handed.   Patient has a Master's degree.   Social Determinants of Health   Financial Resource Strain: Low Risk  (05/15/2022)   Overall Financial Resource Strain (CARDIA)    Difficulty of Paying Living Expenses: Not hard at all  Food Insecurity: No Food Insecurity (05/15/2022)   Hunger Vital Sign    Worried About Running Out of Food in the Last Year: Never true    Ran Out of Food in the Last Year: Never true  Transportation Needs: No Transportation Needs (05/15/2022)   PRAPARE - Hydrologist (Medical): No    Lack of Transportation (Non-Medical): No  Physical Activity: Sufficiently Active (05/15/2022)   Exercise Vital Sign    Days of Exercise per Week: 5 days    Minutes of Exercise per Session: 40 min  Stress: Stress Concern Present (05/15/2022)   [place]    Feeling of Stress : To some extent  Social Connections: Socially Isolated (05/15/2022)   Social Connection and Isolation Panel [NHANES]    Frequency of Communication with Friends and Family: More than three times a week    Frequency of  Social Gatherings with Friends and Family: More than three times a week    Attends Religious Services: Never    Marine scientist or Organizations: No    Attends Music therapist: Never    Marital Status: Divorced    Tobacco Counseling Ready to quit: No Counseling given: Yes Tobacco comments: 2- 7-16  STILL SMOKING   Clinical Intake:  Pre-visit preparation completed: Yes  Pain : No/denies pain     BMI - recorded: 27.06 Nutritional Status: BMI 25 -29 Overweight Nutritional Risks: None Diabetes: No  How often do you need to have someone help you when you read instructions, pamphlets, or other written materials from your doctor or pharmacy?: 1 - Never  Diabetic?  No  Interpreter Needed?: No  Information entered by :: Rolene Arbour LPN   Activities of Daily Living    05/15/2022    2:10 PM 05/14/2022    1:22 PM  In your present state of health, do you have any difficulty performing the following activities:  Hearing? 0 0  Vision? 0 0  Difficulty concentrating or making decisions? 0 1  Walking or climbing stairs? 0 0  Dressing or bathing? 0 0  Doing errands, shopping? 0 0  Preparing Food and eating ? N N  Using the Toilet? N N  In the past six months, have you accidently leaked urine? N N  Do you have problems with loss of bowel control? N N  Managing your Medications? N N  Managing your Finances? N Y  Housekeeping or managing your Housekeeping? N Y    Patient Care Team: Briant Cedar, MD as PCP - General Carlean Purl Ofilia Neas, MD (Gastroenterology) Yaakov Guthrie, PsyD (Inactive) (Psychiatry) [name], MD  as Attending Physician (Internal Medicine) Dohmeier, Asencion Partridge, MD (Neurology) Normajean Glasgow, MD as Attending Physician (Physical Medicine and Rehabilitation) Eli Hose, [facility] (Inactive) as Pharmacist (Pharmacist)  Indicate any recent Medical Services you may have received from other than [facility] providers in the past year (date may  be approximate).     Assessment:   This is a routine wellness examination for [place].  Hearing/Vision screen Hearing Screening - Comments:: Denies hearing difficulties   Vision Screening - Comments::  - up to date with routine eye exams with  [facility]  Dietary issues and exercise activities discussed: Exercise limited by: None identified   Goals Addressed               This Visit's Progress     Stay healthy (pt-stated)         Depression Screen    05/15/2022    2:07 PM 05/15/2022    2:06 PM 12/31/2021    3:04 PM 11/20/2021   11:36 AM 05/10/2021    2:02 PM 05/02/2021   12:06 PM 06/16/2018    1:20 PM  PHQ 2/9 Scores  PHQ - 2 Score 2 0  _0 0  PHQ- 9 Score _1 Information is confidential and restricted. Go to Review Flowsheets to unlock data.    Fall Risk    05/15/2022    2:10 PM 05/14/2022    1:22 PM 11/20/2021   11:36 AM 05/10/2021   12:45 PM 05/02/2021   12:06 PM  Fall Risk   Falls in the past year? _2 Number falls in past yr: 0 1 0 1 1  Injury with Fall? 1 0 _3 Comment Injury to rt arm. Followed by medical attention   Patient injured Rt arm followed by [place] for fall due to : Impaired balance/gait  No Fall Risks    Follow up Falls prevention discussed  Falls prevention discussed      FALL RISK PREVENTION PERTAINING TO THE HOME:  Any stairs in or around the home? No  If so, are there any without handrails? No  Home free of loose throw rugs in walkways, pet beds, electrical cords, etc? Yes  Adequate lighting in your home to reduce risk of falls? Yes   ASSISTIVE DEVICES UTILIZED TO PREVENT FALLS:  Life alert? No  Use of a cane, walker or w/c? No  Grab bars in the bathroom? No  Shower chair or bench in shower? Yes  Elevated toilet seat or a handicapped toilet? No   TIMED UP AND GO:  Was the test performed? No . Audio Visit   Cognitive Function:      11/27/2021    2:36 PM 05/29/2021    1:20 PM  02/09/2020   10:26 AM 11/25/2018    3:35 PM 05/26/2018    3:44 PM  Montreal Cognitive Assessment   Visuospatial/ Executive (0/5) _4 Naming (0/3) _5 Attention: Read list of digits (0/2) 0 1 1 0 1  Attention: Read list of letters (0/1) _6 Attention: Serial 7 subtraction starting at 100 (0/3) _7 Language: Repeat phrase (0/2) _8 Language : Fluency (0/1) _9 Abstraction (0/2) _10 Delayed Recall (0/5) _11 3  3  Orientation (0/6) _0 Total _1 05/15/2022    2:14 PM 05/10/2021    2:24 PM  6CIT Screen  What Year? 0 points 0 points  What month? 0 points 0 points  What time? 0 points 0 points  Count back from 20 0 points 2 points  Months in reverse 0 points 0 points  Repeat phrase 0 points 2 points  Total Score 0 points 4 points    Immunizations Immunization History  Administered Date(s) Administered   Hep A / Hep B 06/16/2018, 06/16/2018, 06/16/2018, 08/09/2018, 08/09/2018, 02/27/2019   Influenza Split 03/02/2014   Influenza Whole 03/05/2007, 02/11/2012   Influenza,inj,Quad PF,6+ Mos 03/24/2015, 01/08/2018, 02/08/2019   Influenza-Unspecified 03/10/2017   Moderna Covid-19 Vaccine Bivalent Booster 62yr & up 04/09/2021   Moderna Sars-Covid-2 Vaccination 06/25/2019, 07/23/2019, 02/04/2020   Pneumococcal Conjugate-13 11/02/2013   Td 12/12/2002   Tdap 11/02/2013, 01/16/2020   Zoster, Live 08/11/2012    TDAP status: Up to date  Flu Vaccine status: Up to date  Pneumococcal vaccine status: Up to date  Covid-19 vaccine status: Completed vaccines  Qualifies for Shingles Vaccine? Yes   Zostavax completed No   Shingrix Completed?: No.    Education has been provided regarding the importance of this vaccine. Patient has been advised to call insurance company to determine out of pocket expense if they have not yet received this vaccine. Advised may also receive vaccine at local pharmacy or Health Dept. Verbalized  acceptance and understanding.  Screening Tests Health Maintenance  Topic Date Due   COVID-19 Vaccine (5 - 2023-24 season) 05/31/2022 (Originally 01/11/2022)   Zoster Vaccines- Shingrix (1 of 2) 08/14/2022 (Originally 08/14/1979)   Lung Cancer Screening  05/16/2023 (Originally 06/20/2016)   INFLUENZA VACCINE  03/06/2024 (Originally 12/11/2021)   Medicare Annual Wellness (AWV)  05/16/2023   MAMMOGRAM  08/21/2023   DTaP/Tdap/Td (4 - Td or Tdap) 01/15/2030   COLONOSCOPY (Pts 45-436yrInsurance coverage will need to be confirmed)  03/20/2030   Hepatitis C Screening  Completed   HIV Screening  Completed   HPV VACCINES  Aged Out    Health Maintenance  There are no preventive care reminders to display for this patient.   Colorectal cancer screening: Type of screening: Colonoscopy. Completed 03/20/20. Repeat every 10 years  Mammogram status: Completed 08/20/21. Repeat every year    Lung Cancer Screening: (Low Dose CT Chest recommended if Age 62-80ears, 30 pack-year currently smoking OR have quit w/in 15years.) does qualify.   Lung Cancer Screening Referral: Patient deferred  Additional Screening:  Hepatitis C Screening: does qualify; Completed 05/30/17  Vision Screening: Recommended annual ophthalmology exams for early detection of glaucoma and other disorders of the eye. Is the patient up to date with their annual eye exam?  Yes  Who is the provider or what is the name of the office in which the patient attends annual eye exams? [facility] If pt is not established with a provider, would they like to be referred to a provider to establish care? No .   Dental Screening: Recommended annual dental exams for proper oral hygiene  Community Resource Referral / Chronic Care Management:  CRR required this visit?  No   CCM required this visit?  No      Plan:     I have personally reviewed and noted the following in the patient's chart:   Medical and social history Use of alcohol,  tobacco or illicit drugs  Current medications and supplements including opioid prescriptions. Patient is not currently taking opioid prescriptions. Functional ability and status Nutritional status Physical activity Advanced directives List of other physicians Hospitalizations, surgeries, and ER visits in previous 12 months Vitals Screenings to include cognitive, depression, and falls Referrals and appointments  In addition, I have reviewed and discussed with patient certain preventive protocols, quality metrics, and best practice recommendations. A written personalized care plan for preventive services as well as general preventive health recommendations were provided to patient.     Criselda Peaches, LPN   12/14/6627   Nurse Notes: Patient due Lung Cancer Screening also patient request f/u with concerns of increasing medication   Ozempic

## 2022-05-22 ENCOUNTER — Telehealth (HOSPITAL_COMMUNITY): Payer: Medicare Other | Admitting: Student in an Organized Health Care Education/Training Program

## 2022-05-22 DIAGNOSIS — F331 Major depressive disorder, recurrent, moderate: Secondary | ICD-10-CM

## 2022-05-22 DIAGNOSIS — F4312 Post-traumatic stress disorder, chronic: Secondary | ICD-10-CM

## 2022-05-22 DIAGNOSIS — F411 Generalized anxiety disorder: Secondary | ICD-10-CM

## 2022-05-22 NOTE — Progress Notes (Signed)
  [facility] [address] [place] [address] Dept: [phone] Dept Fax: [phone]   Virtual Visit via Video Note  I connected with [name] on 05/22/22 at  1:00 PM EST by a video enabled telemedicine application, however, she did not attend the appointment  She will be rescheduled.  Psychotherapy Progress Note  Patient ID: [name], female  DOB: 08/21/1960, 62 y.o.  MRN: [id number]  05/22/2022  Briant Cedar, MD 05/22/2022

## 2022-05-24 ENCOUNTER — Other Ambulatory Visit: Payer: Self-pay | Admitting: Hematology & Oncology

## 2022-05-24 DIAGNOSIS — C801 Malignant (primary) neoplasm, unspecified: Secondary | ICD-10-CM

## 2022-05-24 DIAGNOSIS — F4323 Adjustment disorder with mixed anxiety and depressed mood: Secondary | ICD-10-CM

## 2022-05-27 DIAGNOSIS — H43813 Vitreous degeneration, bilateral: Secondary | ICD-10-CM | POA: Diagnosis not present

## 2022-05-28 ENCOUNTER — Other Ambulatory Visit: Payer: Self-pay | Admitting: Neurology

## 2022-05-28 MED ORDER — AMPHETAMINE-DEXTROAMPHET ER 20 MG PO CP24: 20.0000 mg | ORAL_CAPSULE | Freq: Every day | ORAL | 0 refills | Status: DC

## 2022-05-28 NOTE — Telephone Encounter (Signed)
Verify Drug Registry For Dextroamp-Amphet Er 20 Mg Cap Last Filled: 04/25/2022 Quantity: 30 tablets for 30 days Last appointment: 11/27/2021 Next appointment: 06/05/2022

## 2022-05-28 NOTE — Telephone Encounter (Signed)
Pt is calling. Requesting a refill on amphetamine-dextroamphetamine (ADDERALL XR) 20 MG 24 hr capsule. Should be sent to [facility] #[id number]

## 2022-05-30 ENCOUNTER — Telehealth: Payer: Self-pay | Admitting: Internal Medicine

## 2022-05-30 NOTE — Telephone Encounter (Signed)
Pt called to say the Pharmacy is telling her they have not received the PA for the refill of the Ozempic.  Pt states she has been out of the Ozempic for 2 weeks.  Pt is aware MD is OOO.   LOV:  02/20/22   [id number] [address], [address] Phone: [phone]  Fax: [phone]

## 2022-05-31 ENCOUNTER — Other Ambulatory Visit (HOSPITAL_COMMUNITY): Payer: Self-pay

## 2022-05-31 NOTE — Telephone Encounter (Signed)
Pharmacy Patient Advocate Encounter   Received notification from [facility] that prior authorization for Ozempic (1 MG/DOSE) '4MG'$ /3ML pen-injectors is required/requested.   PA submitted on 05/31/22 to (ins) [employer] Medicare Part D via CoverMyMeds Key [id number] Status is pending

## 2022-05-31 NOTE — Telephone Encounter (Addendum)
Dr. Velora Mediate patient.    Ozempic for the patient was denied.    Should Prior authorization team proceed with an appeal?   Please advise.

## 2022-05-31 NOTE — Telephone Encounter (Signed)
Pharmacy Patient Advocate Encounter  Received notification from [employer] that the request for prior authorization for Ozempic (1 MG/DOSE) '4MG'$ /3ML pen-injectors has been denied due to Plan Exclusion.       You may call [phone] or fax [phone], to appeal.  Please be advised we currently do not have a Pharmacist to review denials. If you would like [place] to submit it on your behalf, please provide clinical information to support your reason for appeal and any pertinent information you would like [place] to include with the appeal request. Appeals may take longer 5 business days to be submitted as we prepares necessary documentation. Thanks for your support.  How would you like to proceed?  Denial letter is attached to charts

## 2022-06-03 NOTE — Telephone Encounter (Signed)
Spoke with patient about Ozempic denial.    Patient asked did prior authorization include her A1C levels?   She said the denial only stated for weight loss, but she was also taking Ozempic for pre-diabetes.  Patient is requesting an appeal.

## 2022-06-03 NOTE — Telephone Encounter (Signed)
I don not see the point of pursuing this since they clearly will not cover this for weight loss. However I see her last A1c was 6.4, which is righ on the borderline of diabetes. She already has lab orders on file from October including another A1c. I suggest she come in for these, and if the A1cis 6.5 or more, we can use diabetes as a diagnosis for the Ozempic

## 2022-06-05 ENCOUNTER — Ambulatory Visit: Payer: Medicare Other | Admitting: Neurology

## 2022-06-06 ENCOUNTER — Telehealth (HOSPITAL_BASED_OUTPATIENT_CLINIC_OR_DEPARTMENT_OTHER): Payer: Medicare Other | Admitting: Student in an Organized Health Care Education/Training Program

## 2022-06-06 DIAGNOSIS — F411 Generalized anxiety disorder: Secondary | ICD-10-CM | POA: Diagnosis not present

## 2022-06-06 DIAGNOSIS — F4312 Post-traumatic stress disorder, chronic: Secondary | ICD-10-CM | POA: Diagnosis not present

## 2022-06-06 DIAGNOSIS — F331 Major depressive disorder, recurrent, moderate: Secondary | ICD-10-CM

## 2022-06-06 NOTE — Progress Notes (Signed)
[facility] [place] [address] Dept: [phone] Dept Fax: [phone]  Psychotherapy Progress Note  Patient ID: [name], female  DOB: 1960/11/09, 62 y.o.  MRN: [id number]  06/07/2022 Start time: 2:57 PM End time: 3:43 PM  Method of Visit: Video: Virtual Visit via Video Encounter:  I connected with [name] on 06/07/22 at  3:00 PM EST by a video enabled telemedicine application and verified that I am speaking with the correct person using two identifiers.   Location: Patient: Home Provider: [facility]   I discussed the limitations of evaluation and management by telemedicine and the availability of in person appointments. The patient expressed understanding and agreed to proceed.  I discussed the assessment and treatment plan with the patient. The patient was provided an opportunity to ask questions and all were answered. The patient agreed with the plan and demonstrated an understanding of the instructions.   The patient was advised to call back or seek an in-person evaluation if the symptoms worsen or if the condition fails to improve as anticipated.  I provided 45 minutes of non-face-to-face time during this encounter.  Present: patient  Current Concerns:  She reports that she was unable to find the paperwork she needed to get done and so her insurance did lapse.  She reports that she did reach out and call them and had an extension and knew paperwork sent to her.  She reports that she has already started the paperwork and her plan is to finish it tomorrow or over the weekend.  She reports one concern for her is that she had been on Ozempic for months and been able to lose approximately 40 pounds.  She reports that she needed to have a prior authorization done, however, her PCP, Dr. Regis Bill is out of the office and accidentally this provider was listed as her PCP.  She  reports that she thinks this might have been why her medication was denied.  She reports she is continuing to work on this as her A1c had been 6.4 prior to starting the Ozempic and that it was not solely for weight loss which is how it had been incorrectly filed under the prior authorization.  She reports that she was able to finish her closet project.  She reports that she feels good to have finished something with her hands.  She then reports that she is having issues with her hands since then whenever she raised them above her head her fingers have pins and needle feeling.  She reports that she already has Raynaud's and neuropathy.  Discussed with her that it could be thoracic outlet syndrome and she should make an appointment with her PCP as physical therapy might be needed.  She reports that Monday she is going for another tPA treatment to try to unclog her chemo port.  She reports that she is having some issues with her dog CB chewing through things but she has been handling it well.  Encouraged her to notice how she had already reached out and gotten a new set of paperwork sent and already started it.  Discussed with her how last year she was having panic attacks even thinking of doing the paperwork.  She reports that she had not thought of this.  Prior to leaving the appointment she confirmed she was in a stable and safe mindset.  She reports no SI, HI, or AVH.    Current Symptoms: Anxiety,  Depressed Mood, and Organization problem  Psychiatric Specialty Exam: General Appearance: Casual and Fairly Groomed  Eye Contact:  Good  Speech:  Clear and Coherent and Normal Rate  Volume:  Normal  Mood:  Dysphoric  Affect:  Congruent  Thought Process:  Coherent and Goal Directed  Orientation:  Full (Time, Place, and Person)  Thought Content:  WDL and Logical  Suicidal Thoughts:  No  Homicidal Thoughts:  No  Memory:  Immediate;   Good Recent;   Good  Judgement:  Good  Insight:  Good   Psychomotor Activity:  Normal  Concentration:  Concentration: Good and Attention Span: Good  Recall:  Good  Fund of Knowledge:Good  Language: Good  Akathisia:  Negative  Handed:  Right  AIMS (if indicated):  not done  Assets:  Communication Skills Desire for Improvement Housing Resilience  ADL's:  Intact  Cognition: WNL  Sleep:  Poor     Diagnosis: MDD, GAD,  chronic PTSD    Anticipated Frequency of Visits: weekly Anticipated Length of Treatment Episode: 12-16   Short Term Goals/Goals for Treatment Session: Finish paperwork, continue organization Progress Towards Goals: Progressing  Treatment Intervention: Behavior modification, Behavior therapy, Cognitive therapy, Insight-oriented therapy, and Supportive therapy  Medical Necessity: Assisted patient to achieve or maintain maximum functional capacity  Assessment Tools:    05/15/2022    2:07 PM 05/15/2022    2:06 PM 12/31/2021    3:04 PM  Depression screen PHQ 2/9  Decreased Interest 1 0 3  Down, Depressed, Hopeless 1 0 3  PHQ - 2 Score 2 0 6  Altered sleeping 0  3  Tired, decreased energy 0  3  Change in appetite 0  0  Feeling bad or failure about yourself  0  0  Trouble concentrating 0  3  Moving slowly or fidgety/restless 0  0  Suicidal thoughts 0  0  PHQ-9 Score 2  15  Difficult doing work/chores   Extremely dIfficult      Collaboration of Care:   Patient/Guardian was advised Release of Information must be obtained prior to any record release in order to collaborate their care with an outside provider. Patient/Guardian was advised if they have not already done so to contact the registration department to sign all necessary forms in order for [place] to release information regarding their care.   Consent: Patient/Guardian gives verbal consent for treatment and assignment of benefits for services provided during this visit. Patient/Guardian expressed understanding and agreed to proceed.   Plan: Provided  talk/supportive therapy.  She was able to finish her closet project.  Though she did initially pass the deadline to get the paperwork done she reached out to them and got new paperwork sent to her and she has already started it.  Her current plan is to have it done in the next few days.  Prior to leaving the appointment she confirmed she was in a stable and safe mindset.  She reports no SI, HI, or AVH.   Briant Cedar, MD 06/07/2022

## 2022-06-10 ENCOUNTER — Inpatient Hospital Stay: Payer: Medicare Other | Attending: Internal Medicine

## 2022-06-10 DIAGNOSIS — Z859 Personal history of malignant neoplasm, unspecified: Secondary | ICD-10-CM | POA: Insufficient documentation

## 2022-06-10 DIAGNOSIS — Z452 Encounter for adjustment and management of vascular access device: Secondary | ICD-10-CM | POA: Insufficient documentation

## 2022-06-10 NOTE — Progress Notes (Signed)
Pt on the schedule for TPA indwelling. Pac accessed with immediate, excellent blood return. Flush does require more pressure than usual. Attached PAC to IVF that drip well to gravity. Put IVF on IV pump and drips easily on multiple rates without difficulty. Pt states "the radiologist was insistent that I get TPA." Consulted to nurse manager, Maggie who is in agreement that TPA should not be given into a PAC giving good blood return. Maggie also flushed PAC with slight pressure but explained to pt that TPA will not address this issue. PAC can be utilized for infusion as needed based on appearance and function today. Pt verbalizes understanding and appreciation. PAC flushed with Heparin per protocol and d/c per order. dph

## 2022-06-10 NOTE — Patient Instructions (Signed)

## 2022-06-10 NOTE — Telephone Encounter (Signed)
Pharmacy Patient Advocate Encounter  Please be advised we currently do not have a Pharmacist to review denials, therefore you will need to process appeals accordingly as needed. Thanks for your support at this time.  You may call [phone] or fax [phone], to appeal. You may also ask [place] for an appeal through our website at [email or web address]

## 2022-06-11 ENCOUNTER — Telehealth: Payer: Self-pay | Admitting: Internal Medicine

## 2022-06-11 NOTE — Telephone Encounter (Signed)
Would like for someone to call her back regarding her appeal for Ozempic

## 2022-06-12 NOTE — Telephone Encounter (Signed)
See 05/30/22, telephone note.

## 2022-06-12 NOTE — Telephone Encounter (Signed)
Spoke with patient on 06/03/22 and below message from Dr. Sarajane Jews was given.  Patient stated that the medication was denied due to prior authorization for Ozempic was sent for weight loss only.     Patient stated that she is also prediabetic and can the appeal process  be performed  before another A1c be drawn.        Elevated Hemoglobin A1C code R 73.09.   Expedited appeal phone number-[phone].       Please advise if okay to proceed with Expedited appeal on patient's behalf.

## 2022-06-12 NOTE — Telephone Encounter (Signed)
Patient agreed for prescription of Metformin to be sent to [facility].  Patient was not pleased with denial.   Informed patient of below, she can appeal herself if she choose.

## 2022-06-12 NOTE — Telephone Encounter (Signed)
It is clear they will not pay for Ozempic to cover prediabetes. They will however cover Metformin, which can help with weight loss. We can start on this if she agrees

## 2022-06-13 ENCOUNTER — Other Ambulatory Visit: Payer: Self-pay

## 2022-06-13 ENCOUNTER — Other Ambulatory Visit: Payer: Self-pay | Admitting: Family Medicine

## 2022-06-13 DIAGNOSIS — R7309 Other abnormal glucose: Secondary | ICD-10-CM

## 2022-06-13 MED ORDER — METFORMIN HCL 500 MG PO TABS: 500.0000 mg | ORAL_TABLET | Freq: Two times a day (BID) | ORAL | 0 refills | Status: DC

## 2022-06-13 NOTE — Telephone Encounter (Signed)
Call in Metformin 500 mg BID, #60 with no refills (Dr. Regis Bill can take over next week)

## 2022-06-13 NOTE — Telephone Encounter (Addendum)
Metformin 500 mg sent to [facility] on [address]

## 2022-06-20 ENCOUNTER — Encounter (HOSPITAL_COMMUNITY): Payer: Self-pay | Admitting: Student in an Organized Health Care Education/Training Program

## 2022-06-20 ENCOUNTER — Ambulatory Visit (HOSPITAL_BASED_OUTPATIENT_CLINIC_OR_DEPARTMENT_OTHER): Payer: Medicare Other | Admitting: Student in an Organized Health Care Education/Training Program

## 2022-06-20 DIAGNOSIS — F331 Major depressive disorder, recurrent, moderate: Secondary | ICD-10-CM

## 2022-06-20 DIAGNOSIS — F4312 Post-traumatic stress disorder, chronic: Secondary | ICD-10-CM | POA: Diagnosis not present

## 2022-06-20 DIAGNOSIS — F411 Generalized anxiety disorder: Secondary | ICD-10-CM
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
## 2022-06-25 ENCOUNTER — Other Ambulatory Visit: Payer: Self-pay | Admitting: Neurology

## 2022-06-25 MED ORDER — AMPHETAMINE-DEXTROAMPHET ER 20 MG PO CP24: 20.0000 mg | ORAL_CAPSULE | Freq: Every day | ORAL | 0 refills | Status: DC

## 2022-06-25 NOTE — Telephone Encounter (Signed)
I have routed this request to Dr Dohmeier for review. The pt is due for the medication and [place] registry was verified.  

## 2022-06-25 NOTE — Telephone Encounter (Signed)
Pt is calling. Requesting a refill on mphetamine-dextroamphetamine (ADDERALL XR) 20 MG 24 hr capsule. Refill should be sent to [facility] [id number]

## 2022-06-26 ENCOUNTER — Other Ambulatory Visit: Payer: Self-pay | Admitting: Internal Medicine

## 2022-07-02 DIAGNOSIS — R5382 Chronic fatigue, unspecified: Secondary | ICD-10-CM | POA: Diagnosis not present

## 2022-07-02 DIAGNOSIS — Z131 Encounter for screening for diabetes mellitus: Secondary | ICD-10-CM | POA: Diagnosis not present

## 2022-07-02 DIAGNOSIS — E7800 Pure hypercholesterolemia, unspecified: Secondary | ICD-10-CM | POA: Diagnosis not present

## 2022-07-04 ENCOUNTER — Ambulatory Visit (HOSPITAL_COMMUNITY): Payer: Medicare Other | Admitting: Student in an Organized Health Care Education/Training Program

## 2022-07-04 DIAGNOSIS — F5101 Primary insomnia: Secondary | ICD-10-CM | POA: Diagnosis not present

## 2022-07-04 DIAGNOSIS — I10 Essential (primary) hypertension: Secondary | ICD-10-CM | POA: Diagnosis not present

## 2022-07-04 DIAGNOSIS — R5382 Chronic fatigue, unspecified: Secondary | ICD-10-CM | POA: Diagnosis not present

## 2022-07-09 DIAGNOSIS — I10 Essential (primary) hypertension: Secondary | ICD-10-CM | POA: Diagnosis not present

## 2022-07-10 ENCOUNTER — Ambulatory Visit (INDEPENDENT_AMBULATORY_CARE_PROVIDER_SITE_OTHER): Payer: Medicare Other | Admitting: Family Medicine

## 2022-07-10 ENCOUNTER — Encounter: Payer: Self-pay | Admitting: Family Medicine

## 2022-07-10 VITALS — BP 138/84 | HR 77 | Temp 98.1°F | Wt 181.0 lb

## 2022-07-10 DIAGNOSIS — G540 Brachial plexus disorders: Secondary | ICD-10-CM

## 2022-07-10 NOTE — Progress Notes (Signed)
   Subjective:    Patient ID: [name], female    DOB: [birth year], 62 y.o.   MRN: [id number]  HPI Here for one month of intermittent pain and weakness and numbness in both arms and both hands. This started rather suddenly while she was doing some renovations on her house. She notes a very predictable pattern of getting these symptoms every time she raises her arms to shoulder height or above. She denies any neck or upper back pain. She has Raynouds syndrome in both hands, but she says her current symptoms are very different from that. She has been researching these symptoms and she is very concerned that she has developed thoracic outlet syndrome (TOS). She asks to be tested for this because of her hx of PE 's. No SOB or chest pains.    Review of Systems  Constitutional: Negative.   Respiratory: Negative.    Cardiovascular: Negative.   Neurological:  Positive for weakness and numbness.       Objective:   Physical Exam Constitutional:      General: She is not in acute distress.    Appearance: Normal appearance.  Cardiovascular:     Rate and Rhythm: Normal rate and regular rhythm.     Pulses: Normal pulses.     Heart sounds: Normal heart sounds.  Pulmonary:     Effort: Pulmonary effort is normal.     Breath sounds: Normal breath sounds.  Musculoskeletal:     Comments: Her hands are pink and warm. When I ask her to raise her arms to shoulder height, after a few seconds she experiences aching pains in both arms down to the hands   Neurological:     Mental Status: She is alert.           Assessment & Plan:  Intermittent numbness and pain of the arms which indeed is worrisome for TOS. We will set up arterial dopplers of the arms asap. If this is negative, we may then get nerve conduction studies to look for carpal tunnel syndrome.  Alysia Penna, MD

## 2022-07-12 ENCOUNTER — Ambulatory Visit (HOSPITAL_COMMUNITY)
Admission: RE | Admit: 2022-07-12 | Discharge: 2022-07-12 | Disposition: A | Discharge status: Home / Self Care | Payer: Medicare Other | Source: Ambulatory Visit | Attending: Cardiology | Admitting: Cardiology

## 2022-07-12 DIAGNOSIS — G540 Brachial plexus disorders: Secondary | ICD-10-CM | POA: Diagnosis not present

## 2022-07-16 ENCOUNTER — Encounter: Payer: Self-pay | Admitting: Neurology

## 2022-07-16 ENCOUNTER — Ambulatory Visit: Payer: Medicare Other | Admitting: Neurology

## 2022-07-16 VITALS — BP 131/86 | HR 90 | Ht 67.0 in | Wt 182.0 lb

## 2022-07-16 DIAGNOSIS — M542 Cervicalgia: Secondary | ICD-10-CM

## 2022-07-16 DIAGNOSIS — R29818 Other symptoms and signs involving the nervous system: Secondary | ICD-10-CM | POA: Diagnosis not present

## 2022-07-16 DIAGNOSIS — R4189 Other symptoms and signs involving cognitive functions and awareness: Secondary | ICD-10-CM

## 2022-07-16 DIAGNOSIS — C801 Malignant (primary) neoplasm, unspecified: Secondary | ICD-10-CM | POA: Diagnosis not present

## 2022-07-16 DIAGNOSIS — G63 Polyneuropathy in diseases classified elsewhere: Secondary | ICD-10-CM | POA: Diagnosis not present

## 2022-07-16 DIAGNOSIS — F4323 Adjustment disorder with mixed anxiety and depressed mood: Secondary | ICD-10-CM

## 2022-07-16 DIAGNOSIS — G4701 Insomnia due to medical condition: Secondary | ICD-10-CM | POA: Diagnosis not present

## 2022-07-16 DIAGNOSIS — G622 Polyneuropathy due to other toxic agents: Secondary | ICD-10-CM

## 2022-07-16 DIAGNOSIS — K760 Fatty (change of) liver, not elsewhere classified: Secondary | ICD-10-CM | POA: Diagnosis not present

## 2022-07-16 DIAGNOSIS — I10 Essential (primary) hypertension: Secondary | ICD-10-CM | POA: Diagnosis not present

## 2022-07-16 MED ORDER — PREGABALIN 200 MG PO CAPS: ORAL_CAPSULE | ORAL | 5 refills | Status: DC

## 2022-07-16 NOTE — Progress Notes (Signed)
Provider:  Larey Seat, MD  Primary Care Physician:  Burnis Medin, MD [place] [address]     Referring Provider: Burnis Medin, [place],  [place] [address]          Chief Complaint according to patient   Patient presents with:     New Patient (Initial Visit)           HISTORY OF PRESENT ILLNESS:  [name] is a 62 y.o. female Ovarian cancer patient who is here for revisit 07/16/2022 for  memory loss and neuropathy. .  Chief concern according to patient :  07-16-2022: The patient has been losing weight , on Ozempic , she lost 35 pounds ! Prediabetes is reversed.  She  has a new neuropathy symptom, this is pin and needles starting at the hand and radiating upwards to the neck. Shoulder pain is present. Question of radiculopathy. When the arm is elevated and extended- this can be waking her up, so painful is the intense sensation. The most painful spot is at the carpal tunnel.      02-09-2020: RN notes-rm 10. presents for follow up visit. pt states that he has had a pinching sensation in back area which started 2 weeks ago and its intermittent. she has been seeing Dr Sima Matas and feels that has been helping. pt said she had some skin concerns for which she is seeing specialist. She developed a dark spot in her left eye saw her ophthalmologist, a laser procedure is planned for 10/4.  Ovarian Cancer diagnosed in 2009 ! She is a survivor- and made a remarkable recovery. By January 2020 she was happy and well, and during the pandemic she became  tearful , lonely, tearful. She was  depressed, clinically, tearful- needs cognitive therapy and a listener, rather than a prescriber.  She has regained her composure and she is managing her life well, has benefited from cognitive behavior therapy.      1. 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  I have refilled ambien- I do not think it's worth weaning off, given  her longstanding use of sleep aids. Insomnia has improved. She needs more energy, more focus. We discussed adderall. She accepted my offer. 10 mg XR.    2. Cognitive changes are stabile in patient with ovarian cancer and long term chemotherapy - MOCA now again 25-30 from 22-30 since  January 2017.    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  NP also with Dysautonomia, syncopal events, Has fallen in her house. She is aware of her surroundings, stunned but not confused, not postictal. Had a fall in 2016, in 2017, and 3 in 2018 - but none in 2020. NCV did not show anything 4 years ago.    Patient's neuropathy is progressive and dysautonomia is part of this PNP. She already had accu-puncture and cupping and dry needling.    She continues on Lyrica, now  200 mg bid and Duloxetine.- prescribed by Shanon Ace, MD    CD 02-09-2020. I have the pleasure meeting again today again [name] , a multiracial femele patient, who was last seen by our nurse practitioner Amy Lomax.  The patient underwent neuropsychological testing and the last date of service had been 9-21 2021.  This was a follow-up with Dr. Ilean Skill whom she had seen a year earlier for baseline neuropsychological evaluation.  She has ongoing adjustment issues, cognitive  changes following her treatment for cancer.  She had some residual cognitive changes that were attributed to chemotherapy effects.  She also has a neuropathy that is likely related to either the primary malignancy or the treatment thereof.  Dr. 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  The diagnosis of neurocognitive deficits had been made a year earlier. She has been  reporting ongoing hypersomnia- Epworth sleepiness score 15/ 14 points, she has better sleep quality at night.  Bouts of insomnia but much less long and frequent.  She attributed this to Dr Gaspar Cola behavior therapy, improving also anxiety and depression. Depression manifesting I feeling overwhelmed, feeling paralyzed by the overwhelmed feeling. " get no- thing done "   05-26-2018 [name] is a 62 year old female with a history of neuropathy,cognitive changes and insomnia. She returns today for follow-up. The patient continues to take Ambien for insomnia. The patient states that if she does not take this medication she will not be able to go to sleep. The patient currently takes Lyrica 150 mg twice a day for neuropathy. Patient states that the burning and tingling is located in the toes extending to the mid foot bilaterally. She also has burning and tingling in the fingers that extends to the mid hand. Denies any changes with her gait or balance. That she had a fall while standing and watching fireworks. She states that the right leg went numb and cause her to fall. She states that this is the first time this has happened. She states that the numbness resolved. He states that she is not had any additional episodes of numbness. Patient feels that her memory has remained same. She states that she is able to do simple tasks but things that require more complex thinking she has difficulty with. For example she has trouble following plots in a TV show. She states that she has directions for how to make coffee posted. She is able to complete all ADLs independently. She operates a Teacher, music without difficulty. She states that occasionally she'll take a wrong turn to a familiar place. She continues to take Aricept 10 mg daily. Patient states that she did have a sleep study area and she is unsure if she should be wearing oxygen at bedtime. She states that she had a dental procedure and the technician noted  that her oxygen saturation dropped while she was sleeping. She denies any new neurological issues. She returns today for an evaluation.   HISTORY 05/18/14 (CD): [name] is here today for her yearly revisit she is doing remarkably well knowing about her history of metastatic cancer with unknown primary to more she seems to have battled her disease very well.   She has lost some weight since her last visit,  her blood pressure is well controlled.  The HST study performed on 10-23-13 showed obstructive sleep apnea not to be present,  but some hypoxemia at night there were 109 minutes of oxygenation at 88% or less.  Since this test was a home sleep test there is a higher likelihood of artifact. The patient does  1-2 times a night  feel air hungry. She does wake occasionally up with a feeling that she doesn't  breath enough. And her heart  will be racing-  palpitations have also not been seen in her home sleep test. She described sudden falls still being a concern, neuropathy contributing to this.    her cognition is improved, by her own subjective assessment as well as  MOCA test today : scored 26 out of 30 points. This is a normal range for her cognitive function.  Ambien dependent , unable to go to sleep without sleep aid for several years now.   Her list of medication was reviewed and there have been no recent changes. Her dog is her main social contact , besides her church family.    Interval history from 11/28/2015. [name] is here today and did excellent on today's memory testing. There is a variability between 26 and 22 points over the last 18 months. She mastered the trail making test, she recalled 3 out of 5 recall words. The patient had been advised in my last visit with her to avoid driving at night, she lives alone and she relies on her own transportation. She has not gotten lost. She has  an exercise regimen and she has implemented dietary changes. She is an excellent mood today and  would like to reduce her Ambien from 12 point 5 at night to 10 mg which makes me very happy. I would like for her to continue with the Aricept I will follow her every 6 months.   Interval history from 06/05/2016, [name] at the meanwhile 62 year old woman with a long-standing history for ill-defined consult. She can continue to drive without difficulties and is independent in activities of daily living she reports rhinitis but I attributed to Aricept, falls that seem to be neither syncopal, nor seizure, no mechanical in origin. Her falls are always propulsive, she falls forward she has very little warning or numb and she finds herself dominant how she got to the floor. Her medical test score 25 out of 30 points today which is stable and actually improved to a year ago. She is taking Lyrica for neuropathy and Lyrica is also a seizure medicine, I have wondered if Aricept may give her bradycardia but her heart rate today was 92.   Interval history from the tenth of January 2019, I have the pleasure of meeting today with [name], I follow her yearly and we usually look at her degree of fatigue, gait stability and cognitive status.  She scored today on a Montreal cognitive assessment 23 out of 30 points and was last year 25 out of 30 points.  She missed 2 of the 5 recall words.  And the day of the week. She had another fall in October 2018 which she considered bad.  She describes that her legs entangled for some reason she fell onto her right hip she did not suffer fractures just a hematoma.  This happened on a non-slip even floor and she is still not sure what happened. No loss of conscience. Another fall at her sons house on the day before New years. "I sometimes cannot feel my feet and they end up entangled or in the wrong place at the wrong time.  This time I fell and slipped over a coffee table.   05-26-2018.RV , yearly for disability forms.    Mrs. Zebedee Iba- Freddi Che has become a grandmother 39  month ago and is very happy, son and his family live in [place]. She has survived an unknown primary cancer by now for many years. Still having neuropathy - as expected, and memory problem  Review of Systems: Out of a complete 14 system review, the patient complains of only the following symptoms, and all other reviewed systems are negative.:  Fatigue, sleepiness , depression,  Radiculopathy or carpal tunnel?  Subjective memory loss.   Social History   Socioeconomic History   Marital status: Divorced    Spouse name: Not on file   Number of children: 1   Years of education: Master's   Highest education level: Not on file  Occupational History    Employer: UNEMPLOYED    Comment: disabled  Tobacco Use   Smoking status: Every Day    Packs/day: 1.00    Years: 37.00    Total pack years: 37.00    Types: Cigarettes    Start date: 06/16/1978   Smokeless tobacco: Never   Tobacco comments:    2- 7-16  STILL SMOKING  Vaping Use   Vaping Use: Never used  Substance and Sexual Activity   Alcohol use: Yes    Alcohol/week: 0.0 standard drinks of alcohol    Comment: rare, 3 weekly   Drug use: No   Sexual activity: Not Currently  Other Topics Concern   Not on file  Social History Narrative   Divorced   1 son /1 adopted adult son  2 dogs    Sales occupation on disability for now   Regular exercise-yes money stress   Daily caffeine use 2-3 and 2 cups of coffee   On since a security disability since 2012   Patient is right-handed.   Patient has a Master's degree.   Social Determinants of Health   Financial Resource Strain: Low Risk  (05/15/2022)   Overall Financial Resource Strain (CARDIA)    Difficulty of Paying Living Expenses: Not hard at all  Food Insecurity: No Food Insecurity (05/15/2022)   Hunger Vital Sign    Worried About Running Out of Food in the Last Year: Never true    Ran Out of Food in the Last Year: Never true  Transportation Needs: No Transportation Needs  (05/15/2022)   PRAPARE - Hydrologist (Medical): No    Lack of Transportation (Non-Medical): No  Physical Activity: Sufficiently Active (05/15/2022)   Exercise Vital Sign    Days of Exercise per Week: 5 days    Minutes of Exercise per Session: 40 min  Stress: Stress Concern Present (05/15/2022)   [place]    Feeling of Stress : To some extent  Social Connections: Socially Isolated (05/15/2022)   Social Connection and Isolation Panel [NHANES]    Frequency of Communication with Friends and Family: More than three times a week    Frequency of Social Gatherings with Friends and Family: More than three times a week    Attends Religious Services: Never    Marine scientist or Organizations: No    Attends Music therapist: Never    Marital Status: Divorced    Family History  Problem Relation Age of Onset   Pulmonary embolism Mother    Hypertension Father    Breast cancer Maternal Grandmother        diagnosed in early 50s   Hyperlipidemia Other    Breast cancer Sister 44   Colon cancer Neg Hx    Esophageal cancer Neg Hx    Rectal cancer Neg Hx    Stomach cancer Neg Hx     Past Medical History:  Diagnosis Date   Adenocarcinoma ([facility])  unknown primary probaby ovarian 2009 chemo   Adjustment disorder    Allergic rhinitis    Allergy    Alopecia    Anxiety and depression    Arthritis    Blood transfusion without reported diagnosis    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   Clotting disorder ([facility])    GERD (gastroesophageal reflux disease)    HLD (hyperlipidemia)    Hx pulmonary embolism    Hypertension    Iron deficiency anemia due to chronic blood loss 11/04/2016   Metrorrhagia    Neuromuscular disorder ([facility])    Neuropathy    Obesity    Ovarian cancer ([facility]) 04/2008   ChemoTherapy   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   Raynaud's syndrome    Right groin mass     Squamous cell carcinoma of skin    Ulcerative colitis ([facility])    Vision abnormalities    Vitamin D deficiency     Past Surgical History:  Procedure Laterality Date   ABDOMINAL HYSTERECTOMY  05/2008   TAH/BSO   AUGMENTATION MAMMAPLASTY Bilateral 10+ years   BREAST BIOPSY Left 2020   BREAST BIOPSY Right    BREAST REDUCTION SURGERY  2000   CESAREAN SECTION     1991   COLONOSCOPY  2004; 12/07/10   hemorrhoids   ESOPHAGOGASTRODUODENOSCOPY     GASTRIC BYPASS  1979   IR CV LINE INJECTION  03/13/2022   REDUCTION MAMMAPLASTY Bilateral    TOENAIL EXCISION Bilateral    TONSILLECTOMY       Current Outpatient Medications on File Prior to Visit  Medication Sig Dispense Refill   amLODipine (NORVASC) 10 MG tablet TAKE 1 TABLET(10 MG) BY MOUTH DAILY 90 tablet 1   amphetamine-dextroamphetamine (ADDERALL XR) 20 MG 24 hr capsule Take 1 capsule (20 mg total) by mouth daily. 30 capsule 0   aspirin 81 MG tablet Take 81 mg by mouth daily. 2 TABS IN AM     butalbital-acetaminophen-caffeine (FIORICET) 50-325-40 MG tablet Take 1 tablet by mouth every 6 (six) hours as needed for headache (migraine abortion). TAKE 1 TABLET BY MOUTH EVERY 6 HOURS AS NEEDED FOR HEADACHE further refills should come from Neurology team . 10 tablet 5   calcium carbonate (TUMS - DOSED IN MG ELEMENTAL CALCIUM) 500 MG chewable tablet Chew 1 tablet by mouth daily as needed for indigestion or heartburn.     diclofenac (VOLTAREN) 75 MG EC tablet TK 1 T PO BID AFTER MEALS FOR INFLAMMATION / PAIN / SWELLING PRN ONLY  1   donepezil (ARICEPT) 10 MG tablet TAKE 1 TABLET(10 MG) BY MOUTH EVERY MORNING 90 tablet 1   DULoxetine (CYMBALTA) 60 MG capsule TAKE 1 CAPSULE(60 MG) BY MOUTH DAILY 90 capsule 0   hydrOXYzine (VISTARIL) 25 MG capsule Take 1 capsule (25 mg total) by mouth 3 (three) times daily as needed. 30 capsule 0   metFORMIN (GLUCOPHAGE) 500 MG tablet TAKE 1 TABLET(500 MG) BY MOUTH TWICE DAILY WITH A MEAL 180 tablet 0   Multiple Vitamin  (MULTIVITAMIN WITH MINERALS) TABS tablet Take 1 tablet by mouth daily.     orphenadrine (NORFLEX) 100 MG tablet TAKE ONE TABLET BY MOUTH TWICE DAILY as needed for muscle spasms 180 tablet 0   pregabalin (LYRICA) 200 MG capsule TAKE 1 CAPSULE(200 MG) BY MOUTH TWICE DAILY 60 capsule 3   rosuvastatin (CRESTOR) 5 MG tablet Take 2 tablets (10 mg total) by mouth daily. 90 tablet 1   Semaglutide, 1 MG/DOSE, 4 MG/3ML SOPN Inject 1 mg as directed once  a week. 3 mL 3   spironolactone (ALDACTONE) 25 MG tablet TAKE 1 TABLET(25 MG) BY MOUTH DAILY 90 tablet 0   Vitamin D, Ergocalciferol, (DRISDOL) 1.25 MG (50000 UNIT) CAPS capsule TAKE 1 CAPSULE BY MOUTH 1 TIME A WEEK 13 capsule 2   zolpidem (AMBIEN) 5 MG tablet TAKE 1 TO 2 TABLETS(5 TO 10 MG) BY MOUTH EVERY NIGHT AT BEDTIME AS NEEDED 180 tablet 1   No current facility-administered medications on file prior to visit.    Allergies  Allergen Reactions   Codeine Nausea Only   Orange Itching and Rash     DIAGNOSTIC DATA (LABS, IMAGING, TESTING) - I reviewed patient records, labs, notes, testing and imaging myself where available.  Lab Results  Component Value Date   WBC 5.6 02/08/2022   HGB 12.8 02/08/2022   HCT 38.9 02/08/2022   MCV 87.8 02/08/2022   PLT 189 02/08/2022      Component Value Date/Time   NA 139 02/08/2022 1135   NA 147 (H) 04/16/2017 1152   NA 139 12/27/2015 1156   K 3.6 02/08/2022 1135   K 3.4 04/16/2017 1152   K 3.9 12/27/2015 1156   CL 107 02/08/2022 1135   CL 101 04/16/2017 1152   CO2 26 02/08/2022 1135   CO2 30 04/16/2017 1152   CO2 27 12/27/2015 1156   GLUCOSE 98 02/08/2022 1135   GLUCOSE 89 04/16/2017 1152   BUN 24 (H) 02/08/2022 1135   BUN 18 04/16/2017 1152   BUN 14.9 12/27/2015 1156   CREATININE 0.96 02/08/2022 1135   CREATININE 1.07 (H) 03/01/2020 1626   CREATININE 0.9 12/27/2015 1156   CALCIUM 9.6 02/08/2022 1135   CALCIUM 9.8 04/16/2017 1152   CALCIUM 9.7 12/27/2015 1156   PROT 6.1 (L) 02/08/2022 1135    PROT 6.5 04/16/2017 1152   PROT 6.8 12/27/2015 1156   ALBUMIN 3.9 02/08/2022 1135   ALBUMIN 3.6 04/16/2017 1152   ALBUMIN 4.0 07/08/2016 1432   ALBUMIN 3.5 12/27/2015 1156   AST 27 02/08/2022 1135   AST 29 12/27/2015 1156   ALT 27 02/08/2022 1135   ALT 26 04/16/2017 1152   ALT 28 12/27/2015 1156   ALKPHOS 97 02/08/2022 1135   ALKPHOS 87 (H) 04/16/2017 1152   ALKPHOS 106 12/27/2015 1156   BILITOT 0.4 02/08/2022 1135   BILITOT 0.33 12/27/2015 1156   GFRNONAA >60 02/08/2022 1135   GFRNONAA 57 (L) 03/01/2020 1626   GFRAA 66 03/01/2020 1626   Lab Results  Component Value Date   CHOL 183 11/20/2021   HDL 51.80 11/20/2021   LDLCALC 109 (H) 11/20/2021   LDLDIRECT 98.7 09/04/2009   TRIG 115.0 11/20/2021   CHOLHDL 4 11/20/2021   Lab Results  Component Value Date   HGBA1C 6.4 11/20/2021   Lab Results  Component Value Date   VITAMINB12 840 11/20/2021   Lab Results  Component Value Date   TSH 2.67 11/20/2021    PHYSICAL EXAM:  Today's Vitals   07/16/22 1358  BP: 131/86  Pulse: 90  Weight: 182 lb (82.6 kg)  Height: '5\' 7"'$  (1.702 m)   Body mass index is 28.51 kg/m.   Wt Readings from Last 3 Encounters:  07/16/22 182 lb (82.6 kg)  07/10/22 181 lb (82.1 kg)  05/15/22 178 lb (80.7 kg)     Ht Readings from Last 3 Encounters:  07/16/22 '5\' 7"'$  (1.702 m)  05/15/22 '5\' 8"'$  (1.727 m)  02/08/22 '5\' 8"'$  (1.727 m)      General: The patient  is awake, alert and appears not in acute distress. The patient is well groomed. Head: Normocephalic, atraumatic. Neck is supple.  Cardiovascular:  Regular rate and cardiac rhythm by pulse,  without distended neck veins. Respiratory: Lungs are clear to auscultation.  Skin:  Without evidence of ankle edema, or rash. Trunk: The patient's posture is erect.   NEUROLOGIC EXAM: The patient is awake and alert, oriented to place and time.   Memory subjective described as impaired ;      07/16/2022    2:00 PM 11/27/2021    2:36 PM 05/29/2021     1:20 PM 02/09/2020   10:26 AM 11/25/2018    3:35 PM  Montreal Cognitive Assessment   Visuospatial/ Executive (0/5) '3 3 3 5 3  '$ Naming (0/3) '3 3 3 3 3  '$ Attention: Read list of digits (0/2) 2 0 1 1 0  Attention: Read list of letters (0/1) '1 1 1 1 1  '$ Attention: Serial 7 subtraction starting at 100 (0/3) '3 3 3 2 3  '$ Language: Repeat phrase (0/2) '2 1 2 2 1  '$ Language : Fluency (0/1) '1 1 1 1 1  '$ Abstraction (0/2) '2 2 2 2 2  '$ Delayed Recall (0/5) '3 3 3 3 3  '$ Orientation (0/6) '5 5 6 5 5  '$ Total '25 22 25 25 22    '$ Attention span & concentration ability appears normal.  Speech is fluent,  without  dysarthria, dysphonia or aphasia.  Mood and affect are appropriate.   Cranial nerve : No change in taste ,but complete loss of smell .ANOSMIA. for 8 years now.  She feels as if she smells, but  couldn't.  Pupils were equal round reactive to light.  Extraocular movements were full, visual field were full on confrontational test . Facial sensation and strength were normal. Uvula and tongue midline.  Bilateral tongue protrusion is strong.   Motor: dropping things, lack of grip strength , preserved lower extremities except foot dorsiflexion.  symmetric motor tone is noted throughout.  Sensory:  Progressed, profound numbness in feet and calf. Left knee pain.  Arising from the toes through the forefoot to the heel. Pain arising from the Forefoot.    Hand pain and Raynauds to the wrist, ascending to shoulder, not neck.  Had tension neck pain.  Coordination:  Gait and station: Gait is normal. Tandem gait is abnormal.  She needs to control her steps  Optically, needs to see where she steps due to lack of sensation.   Romberg is negative !  Reflexes: Deep tendon reflexes are symmetrically  ASSESSMENT AND PLAN 62 y.o. year old female  here with:    1)  cervicalgia and hand pain, right an left now.  Worse when arm is lifted and extended. Loss of grip strength and tinging sensation in palm of both hands.  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  I will order a NCV and EMG of both upper extremities. Possible contribution of weakness by sarcopenia under Ozempic.  2) memory loss, MCI is stable- MOCA at 25/ 30 is comparable for the last 3 years. Mood is affecting her memory.  3) procrastination, fatigue, self defeated,  she is depressed.   NCV and EMG for fingers to cervical spine , I would like to see both upper extremities but she may not be able to tolerate that.   Follow up through our NP within 6-8 months.  I like to discuss the NCV/EMG results on  mychart or by  phone earlier than that RV.   I would like to thank Panosh, Standley Brooking, MD for allowing me to meet with and to take care of this pleasant patient.    After spending a total time of 30  minutes face to face and additional time for physical and neurologic examination, review of laboratory studies,  personal review of imaging studies, reports and results of other testing and review of referral information / records as far as provided in visit,   Electronically signed by: Larey Seat, MD 07/16/2022 2:22 PM  [facility] Board certified by The [employer] of Sleep Medicine and Diplomate of the [employer] of Sleep Medicine. Board certified In Neurology through the [place], Fellow of the [employer] of Neurology. Medical Director of [facility].

## 2022-07-16 NOTE — Patient Instructions (Signed)

## 2022-07-17 DIAGNOSIS — M25562 Pain in left knee: Secondary | ICD-10-CM | POA: Diagnosis not present

## 2022-07-20 DIAGNOSIS — G5603 Carpal tunnel syndrome, bilateral upper limbs: Secondary | ICD-10-CM | POA: Diagnosis not present

## 2022-07-23 ENCOUNTER — Ambulatory Visit (INDEPENDENT_AMBULATORY_CARE_PROVIDER_SITE_OTHER): Payer: Medicare Other | Admitting: Internal Medicine

## 2022-07-23 ENCOUNTER — Encounter: Payer: Self-pay | Admitting: Internal Medicine

## 2022-07-23 VITALS — BP 120/78 | HR 86 | Temp 97.5°F | Ht 67.0 in | Wt 176.0 lb

## 2022-07-23 DIAGNOSIS — R208 Other disturbances of skin sensation: Secondary | ICD-10-CM

## 2022-07-23 DIAGNOSIS — F17200 Nicotine dependence, unspecified, uncomplicated: Secondary | ICD-10-CM

## 2022-07-23 DIAGNOSIS — T451X5A Adverse effect of antineoplastic and immunosuppressive drugs, initial encounter: Secondary | ICD-10-CM

## 2022-07-23 DIAGNOSIS — E669 Obesity, unspecified: Secondary | ICD-10-CM

## 2022-07-23 DIAGNOSIS — R7303 Prediabetes: Secondary | ICD-10-CM

## 2022-07-23 DIAGNOSIS — G620 Drug-induced polyneuropathy: Secondary | ICD-10-CM

## 2022-07-23 DIAGNOSIS — I10 Essential (primary) hypertension: Secondary | ICD-10-CM | POA: Diagnosis not present

## 2022-07-23 DIAGNOSIS — Z9884 Bariatric surgery status: Secondary | ICD-10-CM

## 2022-07-23 DIAGNOSIS — Z79899 Other long term (current) drug therapy: Secondary | ICD-10-CM

## 2022-07-23 NOTE — Progress Notes (Signed)
Chief Complaint  Patient presents with   Follow-up    From Dr. Barbie Banner visit. Pt reports she is still having same sx. Numbness/pain on arms. She had went to see her neurologist and Ortho. Her Ortho order nerve test and is scheduled 2 wks from today.     HPI: [name] 62 y.o. come in for fu visit dr Sarajane Jews for sx cw thoractic outlet syndrome but had neg dopplers ue with position provocation.  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 Had been on 3 mos and felt quite helpful hasn't lost this much weight  even after  bariatric surgery at 17 .   Metfomin given by Dr Sarajane Jews feels inc appetite  Still tobacco 1 ppd  Bp seems ok but not low  tends to risk with dizziness? neuro assessment below ASSESSMENT AND PLAN 62 y.o. year old female  here with:     1)  cervicalgia and hand pain, right an left now.  Worse when arm is lifted and extended. Loss of grip strength and tinging sensation in palm of both hands.  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  I will order a NCV and EMG of both upper extremities. Possible contribution of weakness by sarcopenia under Ozempic.  2) memory loss, MCI is stable- MOCA at 25/ 30 is comparable for the last 3 years. Mood is affecting her memory.  3) procrastination, fatigue, self defeated,  she is depressed.    NCV and EMG for fingers to cervical spine , I would like to see both upper extremities but she may not be able to tolerate that.    Follow up through our NP within 6-8 months.  I like to discuss the NCV/EMG results on  mychart or by phone earlier than that RV.  ROS: See pertinent positives and negatives per HPI.  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   Clotting disorder (HCC)    GERD (gastroesophageal reflux disease)    HLD (hyperlipidemia)    Hx pulmonary embolism    Hypertension    Iron deficiency anemia due to chronic blood loss 11/04/2016   Metrorrhagia    Neuromuscular disorder (HCC)    Neuropathy    Obesity    Ovarian cancer ([place]) 04/2008   ChemoTherapy   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   Raynaud's syndrome    Right groin mass    Squamous cell carcinoma of skin    Ulcerative colitis ([place])    Vision abnormalities    Vitamin D deficiency     Family History  Problem Relation Age of Onset   Pulmonary embolism Mother    Hypertension Father    Breast cancer Maternal Grandmother        diagnosed in early 35s   Hyperlipidemia Other    Breast cancer Sister 69   Colon cancer Neg Hx    Esophageal cancer Neg Hx  Rectal cancer Neg Hx    Stomach cancer Neg Hx     Social History   Socioeconomic History   Marital status: Divorced    Spouse name: Not on file   Number of children: 1   Years of education: Master's   Highest education level: Not on file  Occupational History    Employer: UNEMPLOYED    Comment: disabled  Tobacco Use   Smoking status: Every Day    Packs/day: 1.00    Years: 37.00    Total pack years: 37.00    Types: Cigarettes    Start date: 06/16/1978   Smokeless tobacco: Never   Tobacco comments:    2- 7-16  STILL SMOKING  Vaping Use   Vaping Use: Never used  Substance and Sexual Activity   Alcohol use: Yes    Alcohol/week: 0.0 standard drinks of alcohol    Comment: rare, 3 weekly   Drug use: No   Sexual activity: Not Currently  Other Topics Concern   Not on file  Social History Narrative   Divorced   1 son /1 adopted adult son  2 dogs    Sales occupation on disability for now   Regular  exercise-yes money stress   Daily caffeine use 2-3 and 2 cups of coffee   On since a security disability since 2012   Patient is right-handed.   Patient has a Master's degree.   Social Determinants of Health   Financial Resource Strain: Low Risk  (05/15/2022)   Overall Financial Resource Strain (CARDIA)    Difficulty of Paying Living Expenses: Not hard at all  Food Insecurity: No Food Insecurity (05/15/2022)   Hunger Vital Sign    Worried About Running Out of Food in the Last Year: Never true    Ran Out of Food in the Last Year: Never true  Transportation Needs: No Transportation Needs (05/15/2022)   PRAPARE - Hydrologist (Medical): No    Lack of Transportation (Non-Medical): No  Physical Activity: Sufficiently Active (05/15/2022)   Exercise Vital Sign    Days of Exercise per Week: 5 days    Minutes of Exercise per Session: 40 min  Stress: Stress Concern Present (05/15/2022)   [place]    Feeling of Stress : To some extent  Social Connections: Socially Isolated (05/15/2022)   Social Connection and Isolation Panel [NHANES]    Frequency of Communication with Friends and Family: More than three times a week    Frequency of Social Gatherings with Friends and Family: More than three times a week    Attends Religious Services: Never    Marine scientist or Organizations: No    Attends Music therapist: Never    Marital Status: Divorced    Outpatient Medications Prior to Visit  Medication Sig Dispense Refill   amLODipine (NORVASC) 10 MG tablet TAKE 1 TABLET(10 MG) BY MOUTH DAILY 90 tablet 1   amphetamine-dextroamphetamine (ADDERALL XR) 20 MG 24 hr capsule Take 1 capsule (20 mg total) by mouth daily. 30 capsule 0   aspirin 81 MG tablet Take 81 mg by mouth daily. 2 TABS IN AM     butalbital-acetaminophen-caffeine (FIORICET) 50-325-40 MG tablet Take 1 tablet by mouth every 6 (six)  hours as needed for headache (migraine abortion). TAKE 1 TABLET BY MOUTH EVERY 6 HOURS AS NEEDED FOR HEADACHE further refills should come from Neurology team . 10 tablet 5   calcium  carbonate (TUMS - DOSED IN MG ELEMENTAL CALCIUM) 500 MG chewable tablet Chew 1 tablet by mouth daily as needed for indigestion or heartburn.     diclofenac (VOLTAREN) 75 MG EC tablet TK 1 T PO BID AFTER MEALS FOR INFLAMMATION / PAIN / SWELLING PRN ONLY  1   donepezil (ARICEPT) 10 MG tablet TAKE 1 TABLET(10 MG) BY MOUTH EVERY MORNING 90 tablet 1   DULoxetine (CYMBALTA) 60 MG capsule TAKE 1 CAPSULE(60 MG) BY MOUTH DAILY 90 capsule 0   hydrOXYzine (VISTARIL) 25 MG capsule Take 1 capsule (25 mg total) by mouth 3 (three) times daily as needed. 30 capsule 0   metFORMIN (GLUCOPHAGE) 500 MG tablet TAKE 1 TABLET(500 MG) BY MOUTH TWICE DAILY WITH A MEAL 180 tablet 0   Multiple Vitamin (MULTIVITAMIN WITH MINERALS) TABS tablet Take 1 tablet by mouth daily.     orphenadrine (NORFLEX) 100 MG tablet TAKE ONE TABLET BY MOUTH TWICE DAILY as needed for muscle spasms 180 tablet 0   pregabalin (LYRICA) 200 MG capsule Bid po 60 capsule 5   rosuvastatin (CRESTOR) 5 MG tablet Take 2 tablets (10 mg total) by mouth daily. 90 tablet 1   Semaglutide, 1 MG/DOSE, 4 MG/3ML SOPN Inject 1 mg as directed once a week. 3 mL 3   spironolactone (ALDACTONE) 25 MG tablet TAKE 1 TABLET(25 MG) BY MOUTH DAILY 90 tablet 0   Vitamin D, Ergocalciferol, (DRISDOL) 1.25 MG (50000 UNIT) CAPS capsule TAKE 1 CAPSULE BY MOUTH 1 TIME A WEEK 13 capsule 2   zolpidem (AMBIEN) 5 MG tablet TAKE 1 TO 2 TABLETS(5 TO 10 MG) BY MOUTH EVERY NIGHT AT BEDTIME AS NEEDED 180 tablet 1   No facility-administered medications prior to visit.     EXAM:  BP 120/78   Pulse 86   Temp (!) 97.5 F (36.4 C) (Oral)   Ht '5\' 7"'$  (1.702 m)   Wt 176 lb (79.8 kg)   SpO2 96%   BMI 27.57 kg/m   Body mass index is 27.57 kg/m.  GENERAL: vitals reviewed and listed above, alert, oriented,  appears well hydrated and in no acute distress HEENT: atraumatic, conjunctiva  clear, no obvious abnormalities on inspection of external nose and ears NECK: no obvious masses on inspection palpation   CV: HRRR, no clubbing cyanosis or  peripheral edema nl cap refill  repeat bp 120/78 r 124/80 left reg cuff  MS: moves all extremities without noticeable focal  abnormality PSYCH: pleasant and cooperative, no obvious depression or anxiety Lab Results  Component Value Date   WBC 5.6 02/08/2022   HGB 12.8 02/08/2022   HCT 38.9 02/08/2022   PLT 189 02/08/2022   GLUCOSE 98 02/08/2022   CHOL 183 11/20/2021   TRIG 115.0 11/20/2021   HDL 51.80 11/20/2021   LDLDIRECT 98.7 09/04/2009   LDLCALC 109 (H) 11/20/2021   ALT 27 02/08/2022   AST 27 02/08/2022   NA 139 02/08/2022   K 3.6 02/08/2022   CL 107 02/08/2022   CREATININE 0.96 02/08/2022   BUN 24 (H) 02/08/2022   CO2 26 02/08/2022   TSH 2.67 11/20/2021   INR 0.86 06/02/2013   HGBA1C 6.4 11/20/2021   BP Readings from Last 3 Encounters:  07/23/22 120/78  07/16/22 131/86  07/10/22 138/84    ASSESSMENT AND PLAN:  Discussed the following assessment and plan:  Dysesthesia - upper extremities positional new bilateral under evaluation  Medication management  Prediabetes - improved on ozempic  Essential hypertension - current control with weight loss  and meds  Bariatric surgery status  Obesity (BMI 30-39.9)  TOBACCO USE  Neuropathy due to chemotherapeutic drug ([place]) Glp1 may not be covered unless  full dm dx  she has metabolic syndrome  and am willing to continue if she is able to get $$ programmatic help  cautioned about   unknown long term se    dec muscle mass she reports  going to gym and working on Charity fundraiser.   Record review disc about med  dx  risk benefit ant the unknown  that being said If needed I am willing to rx mounjar o of retry ozempic  32 minutes  -Patient advised to return or notify health care team  if   new concerns arise.  Patient Instructions  Glad to see you  today .   I agee with  finishing  the evaluation for the  tingling pain .   I am ok with ozempic /mounjaro   but  usually only f DM is the dx.  You have prediabetes .  Repeat BP 120/78 .    Standley Brooking. Jaqlyn Gruenhagen M.D.

## 2022-07-23 NOTE — Patient Instructions (Signed)
Glad to see you  today .   I agee with  finishing  the evaluation for the  tingling pain .   I am ok with ozempic /mounjaro   but  usually only f DM is the dx.  You have prediabetes .  Repeat BP 120/78 .

## 2022-07-24 ENCOUNTER — Encounter: Payer: Self-pay | Admitting: Internal Medicine

## 2022-07-24 ENCOUNTER — Telehealth (HOSPITAL_COMMUNITY): Payer: Medicare Other | Admitting: Student in an Organized Health Care Education/Training Program

## 2022-07-24 DIAGNOSIS — D2372 Other benign neoplasm of skin of left lower limb, including hip: Secondary | ICD-10-CM | POA: Diagnosis not present

## 2022-07-24 DIAGNOSIS — I10 Essential (primary) hypertension: Secondary | ICD-10-CM | POA: Diagnosis not present

## 2022-07-24 DIAGNOSIS — Z85828 Personal history of other malignant neoplasm of skin: Secondary | ICD-10-CM | POA: Diagnosis not present

## 2022-07-24 DIAGNOSIS — L905 Scar conditions and fibrosis of skin: Secondary | ICD-10-CM | POA: Diagnosis not present

## 2022-07-24 DIAGNOSIS — L814 Other melanin hyperpigmentation: Secondary | ICD-10-CM | POA: Diagnosis not present

## 2022-07-24 DIAGNOSIS — Z08 Encounter for follow-up examination after completed treatment for malignant neoplasm: Secondary | ICD-10-CM | POA: Diagnosis not present

## 2022-07-24 DIAGNOSIS — L821 Other seborrheic keratosis: Secondary | ICD-10-CM | POA: Diagnosis not present

## 2022-07-24 DIAGNOSIS — D235 Other benign neoplasm of skin of trunk: Secondary | ICD-10-CM | POA: Diagnosis not present

## 2022-07-29 NOTE — Telephone Encounter (Signed)
Ask [name]  what contact mail to use . ( Not supposed to use cone e mail for patient related ? HIPAA ?)

## 2022-08-01 ENCOUNTER — Other Ambulatory Visit: Payer: Self-pay | Admitting: Family Medicine

## 2022-08-01 ENCOUNTER — Other Ambulatory Visit: Payer: Self-pay

## 2022-08-01 DIAGNOSIS — R7309 Other abnormal glucose: Secondary | ICD-10-CM

## 2022-08-02 ENCOUNTER — Other Ambulatory Visit: Payer: Self-pay | Admitting: Neurology

## 2022-08-02 NOTE — Telephone Encounter (Signed)
Pt is needing a refill on her amphetamine-dextroamphetamine (ADDERALL XR) 20 MG 24 hr capsule sent to the [facility] on [address] 

## 2022-08-03 ENCOUNTER — Other Ambulatory Visit: Payer: Self-pay | Admitting: Internal Medicine

## 2022-08-03 ENCOUNTER — Other Ambulatory Visit: Payer: Self-pay | Admitting: Hematology & Oncology

## 2022-08-03 DIAGNOSIS — G63 Polyneuropathy in diseases classified elsewhere: Secondary | ICD-10-CM

## 2022-08-05 NOTE — Telephone Encounter (Signed)
I have routed this request to Dr Dohmeier for review. The pt is due for the medication and Gerty registry was verified.  

## 2022-08-06 MED ORDER — AMPHETAMINE-DEXTROAMPHET ER 20 MG PO CP24: 20.0000 mg | ORAL_CAPSULE | Freq: Every day | ORAL | 0 refills | Status: DC

## 2022-08-07 ENCOUNTER — Encounter: Payer: Self-pay | Admitting: Hematology & Oncology

## 2022-08-07 ENCOUNTER — Other Ambulatory Visit: Payer: Self-pay

## 2022-08-07 DIAGNOSIS — G43109 Migraine with aura, not intractable, without status migrainosus: Secondary | ICD-10-CM

## 2022-08-07 DIAGNOSIS — H8113 Benign paroxysmal vertigo, bilateral: Secondary | ICD-10-CM

## 2022-08-07 DIAGNOSIS — G4701 Insomnia due to medical condition: Secondary | ICD-10-CM

## 2022-08-07 DIAGNOSIS — H53143 Visual discomfort, bilateral: Secondary | ICD-10-CM

## 2022-08-07 MED ORDER — DONEPEZIL HCL 10 MG PO TABS: ORAL_TABLET | ORAL | 1 refills | Status: DC

## 2022-08-08 ENCOUNTER — Encounter (HOSPITAL_COMMUNITY): Payer: Self-pay | Admitting: Student in an Organized Health Care Education/Training Program

## 2022-08-08 ENCOUNTER — Telehealth (HOSPITAL_BASED_OUTPATIENT_CLINIC_OR_DEPARTMENT_OTHER): Payer: Medicare Other | Admitting: Student in an Organized Health Care Education/Training Program

## 2022-08-08 DIAGNOSIS — F4312 Post-traumatic stress disorder, chronic: Secondary | ICD-10-CM | POA: Diagnosis not present

## 2022-08-08 DIAGNOSIS — F331 Major depressive disorder, recurrent, moderate: Secondary | ICD-10-CM

## 2022-08-08 DIAGNOSIS — F411 Generalized anxiety disorder: Secondary | ICD-10-CM

## 2022-08-08 NOTE — Progress Notes (Signed)
[facility] [place] [address] Dept: [phone] Dept Fax: [phone]  Psychotherapy Progress Note  Patient ID: [name], female  DOB: Sep 25, 1960, 62 y.o.  MRN: [id number]  08/08/2022 Start time: 10:00 AM End time: 10:55 AM  Method of Visit: Video: Virtual Visit via Video Encounter:  I connected with Shiann D Saxon-Mace on 08/08/22 at 10:00 AM EDT by a video enabled telemedicine application and verified that I am speaking with the correct person using two identifiers.   Location: Patient: Home Provider: [facility]   I discussed the limitations of evaluation and management by telemedicine and the availability of in person appointments. The patient expressed understanding and agreed to proceed.  I discussed the assessment and treatment plan with the patient. The patient was provided an opportunity to ask questions and all were answered. The patient agreed with the plan and demonstrated an understanding of the instructions.   The patient was advised to call back or seek an in-person evaluation if the symptoms worsen or if the condition fails to improve as anticipated.  I provided 55 minutes of non-face-to-face time during this encounter.  The first 3 minutes were done by video, however, there where connection issues so the remaining 52 minutes were done by telephone.  Present: patient  Current Concerns:  She reports that there have been positives and some negatives since our last appointment.  She reports that overall things have been good.  She reports that she has gotten her paperwork done and so has her insurance and everything back in place.  She reports that the pinched nerve going on in her arm is no longer bothering her.  She reports she is also begun going to the gym regularly.  She reports she switched to a new gym the [employer] and now sees a trainer twice a week and attends a class  III days a week.  She reports 1 downside to this is that she was misquoted the price of the trainer and so while they did honor that price this will soon run out and she is unsure if she will still be able to afford it afterwards.  She reports her main issue right now is her issues with her partner [place].  She reports that Jacqlyn Larsen is not supportive of her working out.  She also reports that she wants to go places and do things and Jacqlyn Larsen has a lot of health problems which would limit that.  She reports she wants a partner who is active and can participate in these things with her.  She reports that she feels like she is now intentionally holding back from [place].  She reports that in the past when she found herself holding back this was usually assigned to end the relationship.  She reports a new goal for her is to work on stop smoking.  Prior to leaving the appointment she confirmed she was in a stable and safe mindset.  She reports no SI, HI, or AVH.    Current Symptoms: Anxiety, Depressed Mood, and Family Stress  Psychiatric Specialty Exam: General Appearance: Casual and Fairly Groomed  Eye Contact:  Fair  Speech:  Clear and Coherent and Normal Rate  Volume:  Normal  Mood:   "ok"  Affect:  Appropriate and Congruent  Thought Process:  Coherent and Goal Directed  Orientation:  Full (Time, Place, and Person)  Thought Content:  WDL and Logical  Suicidal Thoughts:  No  Homicidal  Thoughts:  No  Memory:  Immediate;   Good Recent;   Good  Judgement:  Good  Insight:  Good  Psychomotor Activity:  Normal  Concentration:  Concentration: Good and Attention Span: Good  Recall:  Good  Fund of Knowledge:Good  Language: Good  Akathisia:  Negative  Handed:  Right  AIMS (if indicated):  not done  Assets:  Communication Skills Desire for Improvement Financial Resources/Insurance Housing Resilience  ADL's:  Intact  Cognition: WNL  Sleep:  Fair     Diagnosis: MDD, GAD,  chronic PTSD      Anticipated Frequency of Visits: Every 2 weeks Anticipated Length of Treatment Episode: 16  Short Term Goals/Goals for Treatment Session: Continue working out Progress Towards Goals: Progressing as evidenced by new gym with trainer.  Treatment Intervention: Cognitive therapy and Supportive therapy  Medical Necessity: Prevented onset or worsening of patient condition  Assessment Tools:    07/10/2022    3:57 PM 05/15/2022    2:07 PM 05/15/2022    2:06 PM  Depression screen PHQ 2/9  Decreased Interest 1 1 0  Down, Depressed, Hopeless 1 1 0  PHQ - 2 Score 2 2 0  Altered sleeping 1 0   Tired, decreased energy 1 0   Change in appetite 1 0   Feeling bad or failure about yourself  0 0   Trouble concentrating 3 0   Moving slowly or fidgety/restless 0 0   Suicidal thoughts 0 0   PHQ-9 Score 8 2   Difficult doing work/chores Very difficult     Failed to redirect to the Timeline version of the REVFS SmartLink.   Collaboration of Care:   Patient/Guardian was advised Release of Information must be obtained prior to any record release in order to collaborate their care with an outside provider. Patient/Guardian was advised if they have not already done so to contact the registration department to sign all necessary forms in order for [place] to release information regarding their care.   Consent: Patient/Guardian gives verbal consent for treatment and assignment of benefits for services provided during this visit. Patient/Guardian expressed understanding and agreed to proceed.   Plan: Provided Supportive/Talk therapy.  She has made several positive steps since our last appointment-finishing the paperwork and getting into the gym more regularly.  She is now set a new goal of stopping smoking.  Prior to leaving the appointment she confirmed she was in a stable and safe mindset.  She reports no SI, HI, or AVH.   Briant Cedar, MD 08/08/2022

## 2022-08-09 ENCOUNTER — Inpatient Hospital Stay: Payer: Medicare Other | Admitting: Hematology & Oncology

## 2022-08-09 ENCOUNTER — Other Ambulatory Visit: Payer: Self-pay

## 2022-08-09 ENCOUNTER — Inpatient Hospital Stay: Payer: Medicare Other | Attending: Internal Medicine

## 2022-08-09 ENCOUNTER — Inpatient Hospital Stay: Payer: Medicare Other

## 2022-08-09 ENCOUNTER — Encounter: Payer: Self-pay | Admitting: Hematology & Oncology

## 2022-08-09 VITALS — BP 118/77 | HR 84 | Temp 97.4°F | Resp 17 | Ht 67.0 in | Wt 177.0 lb

## 2022-08-09 DIAGNOSIS — D500 Iron deficiency anemia secondary to blood loss (chronic): Secondary | ICD-10-CM

## 2022-08-09 DIAGNOSIS — C801 Malignant (primary) neoplasm, unspecified: Secondary | ICD-10-CM

## 2022-08-09 DIAGNOSIS — Z859 Personal history of malignant neoplasm, unspecified: Secondary | ICD-10-CM | POA: Diagnosis not present

## 2022-08-09 DIAGNOSIS — D509 Iron deficiency anemia, unspecified: Secondary | ICD-10-CM | POA: Insufficient documentation

## 2022-08-09 DIAGNOSIS — F17200 Nicotine dependence, unspecified, uncomplicated: Secondary | ICD-10-CM | POA: Diagnosis not present

## 2022-08-09 DIAGNOSIS — F17210 Nicotine dependence, cigarettes, uncomplicated: Secondary | ICD-10-CM | POA: Insufficient documentation

## 2022-08-09 DIAGNOSIS — Z9221 Personal history of antineoplastic chemotherapy: Secondary | ICD-10-CM | POA: Diagnosis not present

## 2022-08-09 DIAGNOSIS — G620 Drug-induced polyneuropathy: Secondary | ICD-10-CM | POA: Insufficient documentation

## 2022-08-09 DIAGNOSIS — Z79899 Other long term (current) drug therapy: Secondary | ICD-10-CM

## 2022-08-09 DIAGNOSIS — R7309 Other abnormal glucose: Secondary | ICD-10-CM

## 2022-08-09 DIAGNOSIS — T451X5A Adverse effect of antineoplastic and immunosuppressive drugs, initial encounter: Secondary | ICD-10-CM | POA: Diagnosis not present

## 2022-08-09 DIAGNOSIS — Z9884 Bariatric surgery status: Secondary | ICD-10-CM

## 2022-08-09 DIAGNOSIS — I10 Essential (primary) hypertension: Secondary | ICD-10-CM

## 2022-08-09 LAB — CMP (CANCER CENTER ONLY)
ALT: 34 U/L (ref 0–44)
AST: 34 U/L (ref 15–41)
Albumin: 4.1 g/dL (ref 3.5–5.0)
Alkaline Phosphatase: 89 U/L (ref 38–126)
Anion gap: 11 (ref 5–15)
BUN: 25 mg/dL — ABNORMAL HIGH (ref 8–23)
CO2: 24 mmol/L (ref 22–32)
Calcium: 9.3 mg/dL (ref 8.9–10.3)
Chloride: 103 mmol/L (ref 98–111)
Creatinine: 0.94 mg/dL (ref 0.44–1.00)
GFR, Estimated: >60 mL/min
Glucose, Bld: 81 mg/dL (ref 70–99)
Potassium: 3.9 mmol/L (ref 3.5–5.1)
Sodium: 138 mmol/L (ref 135–145)
Total Bilirubin: 0.4 mg/dL (ref 0.3–1.2)
Total Protein: 5.8 g/dL — ABNORMAL LOW (ref 6.5–8.1)

## 2022-08-09 LAB — CBC WITH DIFFERENTIAL (CANCER CENTER ONLY)
Abs Immature Granulocytes: 0.02 K/uL (ref 0.00–0.07)
Basophils Absolute: 0 K/uL (ref 0.0–0.1)
Basophils Relative: 0 %
Eosinophils Absolute: 0.1 K/uL (ref 0.0–0.5)
Eosinophils Relative: 2 %
HCT: 39.7 % (ref 36.0–46.0)
Hemoglobin: 13.1 g/dL (ref 12.0–15.0)
Immature Granulocytes: 0 %
Lymphocytes Relative: 28 %
Lymphs Abs: 1.7 K/uL (ref 0.7–4.0)
MCH: 29 pg (ref 26.0–34.0)
MCHC: 33 g/dL (ref 30.0–36.0)
MCV: 88 fL (ref 80.0–100.0)
Monocytes Absolute: 0.4 K/uL (ref 0.1–1.0)
Monocytes Relative: 7 %
Neutro Abs: 3.9 K/uL (ref 1.7–7.7)
Neutrophils Relative %: 63 %
Platelet Count: 184 K/uL (ref 150–400)
RBC: 4.51 MIL/uL (ref 3.87–5.11)
RDW: 14.4 % (ref 11.5–15.5)
WBC Count: 6.2 K/uL (ref 4.0–10.5)
nRBC: 0 % (ref 0.0–0.2)

## 2022-08-09 LAB — IRON AND IRON BINDING CAPACITY (CC-WL,HP ONLY)
Iron: 51 ug/dL (ref 28–170)
Saturation Ratios: 16 % (ref 10.4–31.8)
TIBC: 319 ug/dL (ref 250–450)
UIBC: 268 ug/dL (ref 148–442)

## 2022-08-09 LAB — FERRITIN: Ferritin: 26 ng/mL (ref 11–307)

## 2022-08-09 MED ORDER — HEPARIN SOD (PORK) LOCK FLUSH 100 UNIT/ML IV SOLN
500.0000 [IU] | Freq: Once | INTRAVENOUS | Status: AC
  Administered 2022-08-09: 500 [IU] via INTRAVENOUS

## 2022-08-09 MED ORDER — SODIUM CHLORIDE 0.9% FLUSH
10.0000 mL | Freq: Once | INTRAVENOUS | Status: AC
  Administered 2022-08-09: 10 mL via INTRAVENOUS

## 2022-08-09 NOTE — Progress Notes (Signed)
Hematology and Oncology Follow Up Visit  JOELENE GIEL [id number] 20-Nov-1960 62 y.o. 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 Carloni is here today for follow-up.  We last saw her 6 months ago.  She is doing okay.  She has lost quite a bit of weight.  She is exercising.  She is on Ozempic.  Apparently, her insurance company is no longer going to pay for Ozempic.  She is still smoking.  Smokes a pack per day.  She really needs to have a screening CT scan done to make sure that there is no problems with lung cancer.  She has had no problems with the neuropathy.  This is present but relatively controlled.  She has had no fever.  There is been no probably COVID.  She has had no bleeding.  She has had no change in bowel or bladder habits.  Her puppy is grown up.  Her puppy is certainly quite active.  She has had no problems with respect to her breasts.  We did a breast exam on her today.  Everything looks okay with the breast exam.   Her iron studies are doing okay.  When we last saw her, her ferritin was 28 with an iron saturation of 15%.  Her last mammogram was back in April 2023.  I am sure that she is scheduled for 1 in April of this year.  Overall, I would say performance status is probably ECOG 1.     Medications:  Allergies as of 08/09/2022       Reactions   Codeine Nausea Only   Orange Itching, Rash        Medication List        Accurate as of August 09, 2022 12:21 PM. If you have any questions, ask your nurse or doctor.          amLODipine 10 MG tablet Commonly known as: NORVASC TAKE 1 TABLET(10 MG) BY MOUTH DAILY   amphetamine-dextroamphetamine 20 MG 24 hr capsule Commonly known as: Adderall XR Take 1 capsule (20 mg total) by mouth  daily.   aspirin 81 MG tablet Take 81 mg by mouth daily. 2 TABS IN AM   butalbital-acetaminophen-caffeine 50-325-40 MG tablet Commonly known as: FIORICET Take 1 tablet by mouth every 6 (six) hours as needed for headache (migraine abortion). TAKE 1 TABLET BY MOUTH EVERY 6 HOURS AS NEEDED FOR HEADACHE further refills should come from Neurology team .   calcium carbonate 500 MG chewable tablet Commonly known as: TUMS - dosed in mg elemental calcium Chew 1 tablet by mouth daily as needed for indigestion or heartburn.   diclofenac 75 MG EC tablet Commonly known as: VOLTAREN TK 1 T PO BID AFTER MEALS FOR INFLAMMATION / PAIN / SWELLING PRN ONLY   donepezil 10 MG tablet Commonly known as: ARICEPT TAKE 1 TABLET(10 MG) BY MOUTH EVERY MORNING   DULoxetine 60 MG capsule Commonly known as: CYMBALTA TAKE 1 CAPSULE(60 MG) BY MOUTH DAILY   hydrOXYzine 25 MG capsule Commonly known as: VISTARIL Take 1 capsule (25 mg total) by mouth 3 (three) times daily as needed.   metFORMIN 500 MG tablet Commonly known as: GLUCOPHAGE TAKE 1 TABLET(500 MG) BY MOUTH TWICE DAILY WITH A MEAL   multivitamin with minerals Tabs tablet Take 1 tablet by mouth daily.   orphenadrine  100 MG tablet Commonly known as: NORFLEX TAKE ONE TABLET BY MOUTH TWICE DAILY as needed for muscle spasms   pregabalin 200 MG capsule Commonly known as: LYRICA Bid po   rosuvastatin 5 MG tablet Commonly known as: CRESTOR TAKE 1 TABLET(5 MG) BY MOUTH DAILY   Semaglutide (1 MG/DOSE) 4 MG/3ML Sopn Inject 1 mg as directed once a week.   spironolactone 25 MG tablet Commonly known as: ALDACTONE TAKE 1 TABLET(25 MG) BY MOUTH DAILY   Vitamin D (Ergocalciferol) 1.25 MG (50000 UNIT) Caps capsule Commonly known as: DRISDOL TAKE 1 CAPSULE BY MOUTH 1 TIME A WEEK   zolpidem 5 MG tablet Commonly known as: AMBIEN TAKE 1 TO 2 TABLETS(5 TO 10 MG) BY MOUTH EVERY NIGHT AT BEDTIME AS NEEDED        Allergies:  Allergies  Allergen  Reactions   Codeine Nausea Only   Orange Itching and Rash    Past Medical History, Surgical history, Social history, and Family History were reviewed and updated.  Review of Systems: Review of Systems  Constitutional: Negative.   HENT: Negative.    Eyes: Negative.   Respiratory: Negative.    Cardiovascular: Negative.   Gastrointestinal:  Positive for abdominal pain.  Genitourinary: Negative.   Musculoskeletal:  Positive for joint pain.  Skin: Negative.   Neurological:  Positive for tingling.  Endo/Heme/Allergies: Negative.   Psychiatric/Behavioral: Negative.        Physical Exam:  height is 5\' 7"  (1.702 m) and weight is 177 lb (80.3 kg). Her oral temperature is 97.4 F (36.3 C) (abnormal). Her blood pressure is 118/77 and her pulse is 84. Her respiration is 17 and oxygen saturation is 99%.   Wt Readings from Last 3 Encounters:  08/09/22 177 lb (80.3 kg)  07/23/22 176 lb (79.8 kg)  07/16/22 182 lb (82.6 kg)  Her vital signs show temperature of 97.9.  Pulse 92.  Blood pressure 132/91.  Weight is 214 pounds.  Physical Exam Vitals reviewed.  Constitutional:      Comments: Her breast exam shows bilateral breast reductions.  I really cannot palpate any thing unusual with respect to the right breast.  There is no distinct mass.  There is no axillary adenopathy.  HENT:     Head: Normocephalic and atraumatic.  Eyes:     Pupils: Pupils are equal, round, and reactive to light.  Cardiovascular:     Rate and Rhythm: Normal rate and regular rhythm.     Heart sounds: Normal heart sounds.  Pulmonary:     Effort: Pulmonary effort is normal.     Breath sounds: Normal breath sounds.  Abdominal:     General: Bowel sounds are normal.     Palpations: Abdomen is soft.  Musculoskeletal:        General: No tenderness or deformity. Normal range of motion.     Cervical back: Normal range of motion.     Comments: Extremities shows decreased range of motion of the right shoulder.  She has  decreased abduction and external rotation.  Lymphadenopathy:     Cervical: No cervical adenopathy.  Skin:    General: Skin is warm and dry.     Findings: No erythema or rash.  Neurological:     Mental Status: She is alert and oriented to person, place, and time.  Psychiatric:        Behavior: Behavior normal.        Thought Content: Thought content normal.        Judgment: Judgment  normal.     Lab Results  Component Value Date   WBC 6.2 08/09/2022   HGB 13.1 08/09/2022   HCT 39.7 08/09/2022   MCV 88.0 08/09/2022   PLT 184 08/09/2022   Lab Results  Component Value Date   FERRITIN 28 02/08/2022   IRON 43 02/08/2022   TIBC 287 02/08/2022   UIBC 244 02/08/2022   IRONPCTSAT 15 02/08/2022   Lab Results  Component Value Date   RETICCTPCT 1.9 02/08/2022   RBC 4.51 08/09/2022   No results found for: "KPAFRELGTCHN", "LAMBDASER", "KAPLAMBRATIO" No results found for: "IGGSERUM", "IGA", "IGMSERUM" No results found for: "TOTALPROTELP", "ALBUMINELP", "A1GS", "A2GS", "BETS", "BETA2SER", "GAMS", "MSPIKE", "SPEI"   Chemistry      Component Value Date/Time   NA 139 02/08/2022 1135   NA 147 (H) 04/16/2017 1152   NA 139 12/27/2015 1156   K 3.6 02/08/2022 1135   K 3.4 04/16/2017 1152   K 3.9 12/27/2015 1156   CL 107 02/08/2022 1135   CL 101 04/16/2017 1152   CO2 26 02/08/2022 1135   CO2 30 04/16/2017 1152   CO2 27 12/27/2015 1156   BUN 24 (H) 02/08/2022 1135   BUN 18 04/16/2017 1152   BUN 14.9 12/27/2015 1156   CREATININE 0.96 02/08/2022 1135   CREATININE 1.07 (H) 03/01/2020 1626   CREATININE 0.9 12/27/2015 1156      Component Value Date/Time   CALCIUM 9.6 02/08/2022 1135   CALCIUM 9.8 04/16/2017 1152   CALCIUM 9.7 12/27/2015 1156   ALKPHOS 97 02/08/2022 1135   ALKPHOS 87 (H) 04/16/2017 1152   ALKPHOS 106 12/27/2015 1156   AST 27 02/08/2022 1135   AST 29 12/27/2015 1156   ALT 27 02/08/2022 1135   ALT 26 04/16/2017 1152   ALT 28 12/27/2015 1156   BILITOT 0.4  02/08/2022 1135   BILITOT 0.33 12/27/2015 1156       Impression and Plan: Ms. Rester is very pleasant 62 yo African American female with a remote history of a poorly differentiated carcinoma which was felt to be ovarian, BRCA +. She completed 6 cycles of Carboplatin/Taxotere over 13 years ago.   I am glad that she is losing weight.  Hopefully, the insurance company will pay for the [place].  She is looking into getting her family doctor to help with this.  We will have to see what her iron studies show.  I know that we have given her IV iron in the past.  Hopefully, she will cut back on her tobacco use.  I think this is going to be very helpful for her.  We still have to flush her Port-A-Cath.  This has made her life a lot easier.  We will plan to get her back in 6 months.   [name], MD 3/29/202412:21 PM

## 2022-08-09 NOTE — Patient Instructions (Signed)

## 2022-08-12 ENCOUNTER — Encounter: Payer: Self-pay | Admitting: *Deleted

## 2022-08-14 DIAGNOSIS — R5382 Chronic fatigue, unspecified: Secondary | ICD-10-CM | POA: Diagnosis not present

## 2022-08-19 ENCOUNTER — Encounter: Payer: Medicare Other | Attending: Psychology | Admitting: Psychology

## 2022-08-19 DIAGNOSIS — F4310 Post-traumatic stress disorder, unspecified: Secondary | ICD-10-CM | POA: Diagnosis present

## 2022-08-19 DIAGNOSIS — R29818 Other symptoms and signs involving the nervous system: Secondary | ICD-10-CM | POA: Diagnosis present

## 2022-08-19 DIAGNOSIS — F331 Major depressive disorder, recurrent, moderate: Secondary | ICD-10-CM | POA: Diagnosis present

## 2022-08-19 DIAGNOSIS — G479 Sleep disorder, unspecified: Secondary | ICD-10-CM | POA: Diagnosis present

## 2022-08-19 DIAGNOSIS — R4189 Other symptoms and signs involving cognitive functions and awareness: Secondary | ICD-10-CM | POA: Diagnosis present

## 2022-08-20 ENCOUNTER — Encounter: Payer: Self-pay | Admitting: Psychology
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
## 2022-08-21 ENCOUNTER — Other Ambulatory Visit: Payer: Self-pay | Admitting: Hematology & Oncology

## 2022-08-21 DIAGNOSIS — M25569 Pain in unspecified knee: Secondary | ICD-10-CM | POA: Diagnosis not present

## 2022-08-21 DIAGNOSIS — M25519 Pain in unspecified shoulder: Secondary | ICD-10-CM | POA: Diagnosis not present

## 2022-08-21 DIAGNOSIS — M545 Low back pain, unspecified: Secondary | ICD-10-CM | POA: Diagnosis not present

## 2022-08-21 DIAGNOSIS — I10 Essential (primary) hypertension: Secondary | ICD-10-CM | POA: Diagnosis not present

## 2022-08-21 DIAGNOSIS — C801 Malignant (primary) neoplasm, unspecified: Secondary | ICD-10-CM

## 2022-08-28 DIAGNOSIS — E7800 Pure hypercholesterolemia, unspecified: Secondary | ICD-10-CM | POA: Diagnosis not present

## 2022-08-29 ENCOUNTER — Other Ambulatory Visit: Payer: Self-pay | Admitting: Internal Medicine

## 2022-08-29 DIAGNOSIS — S7011XA Contusion of right thigh, initial encounter: Secondary | ICD-10-CM | POA: Diagnosis not present

## 2022-09-04 ENCOUNTER — Other Ambulatory Visit: Payer: Self-pay | Admitting: Neurology

## 2022-09-04 MED ORDER — AMPHETAMINE-DEXTROAMPHET ER 20 MG PO CP24: 20.0000 mg | ORAL_CAPSULE | Freq: Every day | ORAL | 0 refills | Status: DC

## 2022-09-04 NOTE — Telephone Encounter (Signed)
Pt is needing a refill on her amphetamine-dextroamphetamine (ADDERALL XR) 20 MG 24 hr capsule and have it sent to the [facility] on [address]

## 2022-09-05 ENCOUNTER — Telehealth: Payer: Self-pay | Admitting: Neurology

## 2022-09-05 DIAGNOSIS — I10 Essential (primary) hypertension: Secondary | ICD-10-CM | POA: Diagnosis not present

## 2022-09-05 NOTE — Telephone Encounter (Signed)
Pt called yesterday for a refill on her amphetamine-dextroamphetamine (ADDERALL XR) 20 MG 24 hr capsule but she is wanting to know if the dosage can be increased due to it not working as well these last couple of weeks. Please advise.

## 2022-09-05 NOTE — Telephone Encounter (Signed)
Contacted pt back, she stated the Adderall was working well but she is wanting a increase.  During the last 2-3 weeks she has been having an increase in sever fatigue. After she eats, even a small meal like a apple and yogurt, she goes into "a coma" and is sleep for about 4 hours. She tries to stay awake during the day so she can sleep throughout the night but some days its impossible. She hasn't had any changes in health or medications. Do you agree to a increase or should she follow up with behavioral health?

## 2022-09-12 ENCOUNTER — Encounter: Payer: Medicare Other | Admitting: Neurology

## 2022-09-12 ENCOUNTER — Encounter: Payer: Medicare Other | Attending: Psychology | Admitting: Psychology

## 2022-09-12 DIAGNOSIS — G479 Sleep disorder, unspecified: Secondary | ICD-10-CM | POA: Insufficient documentation

## 2022-09-12 DIAGNOSIS — R29818 Other symptoms and signs involving the nervous system: Secondary | ICD-10-CM | POA: Insufficient documentation

## 2022-09-12 DIAGNOSIS — R5383 Other fatigue: Secondary | ICD-10-CM | POA: Diagnosis present

## 2022-09-12 DIAGNOSIS — R4189 Other symptoms and signs involving cognitive functions and awareness: Secondary | ICD-10-CM | POA: Insufficient documentation

## 2022-09-12 DIAGNOSIS — F4310 Post-traumatic stress disorder, unspecified: Secondary | ICD-10-CM | POA: Diagnosis not present

## 2022-09-12 DIAGNOSIS — F331 Major depressive disorder, recurrent, moderate: Secondary | ICD-10-CM | POA: Insufficient documentation

## 2022-09-13 DIAGNOSIS — I10 Essential (primary) hypertension: Secondary | ICD-10-CM | POA: Diagnosis not present

## 2022-09-21 DIAGNOSIS — M25569 Pain in unspecified knee: Secondary | ICD-10-CM | POA: Diagnosis not present

## 2022-09-21 DIAGNOSIS — M25519 Pain in unspecified shoulder: Secondary | ICD-10-CM | POA: Diagnosis not present

## 2022-09-21 DIAGNOSIS — M545 Low back pain, unspecified: Secondary | ICD-10-CM | POA: Diagnosis not present

## 2022-09-25 DIAGNOSIS — I10 Essential (primary) hypertension: Secondary | ICD-10-CM | POA: Diagnosis not present

## 2022-10-01 ENCOUNTER — Other Ambulatory Visit: Payer: Self-pay | Admitting: Neurology

## 2022-10-01 MED ORDER — AMPHETAMINE-DEXTROAMPHET ER 20 MG PO CP24: 20.0000 mg | ORAL_CAPSULE | Freq: Every day | ORAL | 0 refills | Status: DC

## 2022-10-01 NOTE — Telephone Encounter (Signed)
[place] drug registry checked, pt has Lyrica on file to fill at pharmacy. 1/5 were used, she is good until Sept.  -Adderall was last filled 09/09/22 30tab, last seen  07/16/22, no upcoming appt currently scheduled.  ADDERALL IS REQUESTED 8 DAYS EARLY DUE TO PT GOING OUT OF TOWN

## 2022-10-01 NOTE — Telephone Encounter (Signed)
Pt requesting refill on pregabalin (LYRICA) 200 MG capsul and  amphetamine-dextroamphetamine (ADDERALL XR) 20 MG 24 hr capsule. Should be sent to [id number]. Pt said she is going out of town and need medication refill earlier.

## 2022-10-09 ENCOUNTER — Encounter: Payer: Self-pay | Admitting: Psychology

## 2022-10-09 DIAGNOSIS — E7800 Pure hypercholesterolemia, unspecified: Secondary | ICD-10-CM | POA: Diagnosis not present

## 2022-10-09 DIAGNOSIS — I10 Essential (primary) hypertension: Secondary | ICD-10-CM | POA: Diagnosis not present
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
## 2022-10-11 ENCOUNTER — Other Ambulatory Visit: Payer: Self-pay | Admitting: Internal Medicine

## 2022-10-16 ENCOUNTER — Encounter: Payer: Medicare Other | Attending: Psychology | Admitting: Psychology

## 2022-10-16 DIAGNOSIS — R4189 Other symptoms and signs involving cognitive functions and awareness: Secondary | ICD-10-CM | POA: Diagnosis present

## 2022-10-16 DIAGNOSIS — F331 Major depressive disorder, recurrent, moderate: Secondary | ICD-10-CM | POA: Insufficient documentation

## 2022-10-16 DIAGNOSIS — R29818 Other symptoms and signs involving the nervous system: Secondary | ICD-10-CM | POA: Insufficient documentation

## 2022-10-16 DIAGNOSIS — G479 Sleep disorder, unspecified: Secondary | ICD-10-CM | POA: Insufficient documentation

## 2022-10-16 DIAGNOSIS — R5383 Other fatigue: Secondary | ICD-10-CM | POA: Diagnosis present

## 2022-10-16 DIAGNOSIS — F4310 Post-traumatic stress disorder, unspecified: Secondary | ICD-10-CM | POA: Diagnosis present

## 2022-10-17 DIAGNOSIS — I10 Essential (primary) hypertension: Secondary | ICD-10-CM | POA: Diagnosis not present

## 2022-10-17 DIAGNOSIS — E7800 Pure hypercholesterolemia, unspecified: Secondary | ICD-10-CM | POA: Diagnosis not present

## 2022-10-17 DIAGNOSIS — F5101 Primary insomnia: Secondary | ICD-10-CM | POA: Diagnosis not present

## 2022-10-28 ENCOUNTER — Other Ambulatory Visit: Payer: Self-pay | Admitting: Neurology

## 2022-10-28 ENCOUNTER — Other Ambulatory Visit: Payer: Self-pay | Admitting: Family

## 2022-10-28 ENCOUNTER — Other Ambulatory Visit: Payer: Self-pay | Admitting: Hematology & Oncology

## 2022-10-28 DIAGNOSIS — C801 Malignant (primary) neoplasm, unspecified: Secondary | ICD-10-CM

## 2022-10-29 ENCOUNTER — Encounter: Payer: Self-pay | Admitting: Hematology & Oncology

## 2022-10-29 ENCOUNTER — Other Ambulatory Visit: Payer: Self-pay

## 2022-10-30 DIAGNOSIS — E7800 Pure hypercholesterolemia, unspecified: Secondary | ICD-10-CM | POA: Diagnosis not present

## 2022-10-30 DIAGNOSIS — I10 Essential (primary) hypertension: Secondary | ICD-10-CM | POA: Diagnosis not present

## 2022-10-30 DIAGNOSIS — K5900 Constipation, unspecified: Secondary | ICD-10-CM | POA: Diagnosis not present

## 2022-11-02 ENCOUNTER — Other Ambulatory Visit: Payer: Self-pay | Admitting: Family

## 2022-11-03 ENCOUNTER — Encounter: Payer: Self-pay | Admitting: Psychology
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
## 2022-11-05 ENCOUNTER — Other Ambulatory Visit: Payer: Self-pay | Admitting: Internal Medicine

## 2022-11-05 ENCOUNTER — Encounter: Payer: Self-pay | Admitting: Hematology & Oncology

## 2022-11-05 ENCOUNTER — Other Ambulatory Visit: Payer: Self-pay | Admitting: Neurology

## 2022-11-05 DIAGNOSIS — Z1231 Encounter for screening mammogram for malignant neoplasm of breast: Secondary | ICD-10-CM

## 2022-11-05 MED ORDER — AMPHETAMINE-DEXTROAMPHET ER 20 MG PO CP24: 20.0000 mg | ORAL_CAPSULE | Freq: Every day | ORAL | 0 refills | Status: DC

## 2022-11-05 NOTE — Telephone Encounter (Signed)
Pt is needing a refill request for her  amphetamine-dextroamphetamine (ADDERALL XR) 20 MG 24 hr capsule sent in to the [facility] on [address] and [address]

## 2022-11-05 NOTE — Telephone Encounter (Signed)
I have routed this request to Dr Dohmeier for review. The pt is due for the medication and [place] registry was verified.  

## 2022-11-06 ENCOUNTER — Inpatient Hospital Stay: Payer: Medicare Other | Attending: Hematology & Oncology

## 2022-11-06 VITALS — BP 131/93 | HR 70 | Temp 98.0°F | Resp 18 | Wt 171.6 lb

## 2022-11-06 DIAGNOSIS — D500 Iron deficiency anemia secondary to blood loss (chronic): Secondary | ICD-10-CM | POA: Diagnosis not present

## 2022-11-06 DIAGNOSIS — Z95828 Presence of other vascular implants and grafts: Secondary | ICD-10-CM

## 2022-11-06 MED ORDER — ALTEPLASE 2 MG IJ SOLR
2.0000 mg | Freq: Once | INTRAMUSCULAR | Status: AC
  Administered 2022-11-06: 2 mg
  Filled 2022-11-06: qty 2

## 2022-11-06 MED ORDER — HEPARIN SOD (PORK) LOCK FLUSH 100 UNIT/ML IV SOLN: 500.0000 [IU] | INTRAVENOUS | Status: DC | PRN

## 2022-11-06 MED ORDER — SODIUM CHLORIDE 0.9% FLUSH: 10.0000 mL | INTRAVENOUS | Status: DC | PRN

## 2022-11-06 NOTE — Patient Instructions (Signed)
Alteplase Solution for Injection What is this medication? ALTEPLASE (AL te plase) can dissolve blood clots that form in the heart, blood vessels, or lungs after a heart attack. This medicine is also given to improve recovery and decrease the chance of disability in patients having symptoms of a stroke. This medicine may be used for other purposes; ask your health care provider or pharmacist if you have questions. This medicine may be used for other purposes; ask your health care provider or pharmacist if you have questions. COMMON BRAND NAME(S): Activase What should I tell my care team before I take this medication? They need to know if you have any of these conditions: aneurysm bleeding problems or problems with blood clotting blood vessel disease or damaged blood vessels diabetic retinopathy head injury or tumor high blood pressure infection irregular heartbeats previous stroke recent biopsy or surgery an unusual or allergic reaction to alteplase, other medicines, foods, dyes, or preservatives pregnant or trying to get pregnant breast-feeding How should I use this medication? This medicine is for injection into a vein. It is given by a health care professional in a hospital or clinic setting. Talk to your pediatrician regarding the use of this medicine in children. Special care may be needed. Overdosage: If you think you have taken too much of this medicine contact a poison control center or emergency room at once. NOTE: This medicine is only for you. Do not share this medicine with others. Overdosage: If you think you have taken too much of this medicine contact a poison control center or emergency room at once. NOTE: This medicine is only for you. Do not share this medicine with others. What if I miss a dose? This does not apply. What may interact with this medication? Do not take this medicine with any of the following medications: aminocaproic acid aprotinin tranexamic  acid This medicine may also interact with the following medications: antiinflammatory drugs, NSAIDs like ibuprofen aspirin and aspirin-like medicines blood thinners, like warfarin, heparin or enoxaparin dipyridamole This list may not describe all possible interactions. Give your health care provider a list of all the medicines, herbs, non-prescription drugs, or dietary supplements you use. Also tell them if you smoke, drink alcohol, or use illegal drugs. Some items may interact with your medicine. This list may not describe all possible interactions. Give your health care provider a list of all the medicines, herbs, non-prescription drugs, or dietary supplements you use. Also tell them if you smoke, drink alcohol, or use illegal drugs. Some items may interact with your medicine. What should I watch for while using this medication? Your condition will be monitored carefully while you are receiving this medicine. Follow the advice of your doctor or health care professional exactly. You may need bed rest to minimize the risk of bleeding. This medicine can make you bleed more easily. This effect can last for several days. Take special care brushing or flossing your teeth. Do not take aspirin, ibuprofen, or other nonprescription pain relievers during or for several days after alteplase treatment unless otherwise instructed by your doctor or health care professional. You may feel dizzy or lightheaded. To avoid the risk of dizzy or fainting spells, sit or stand up slowly, especially if you are an older patient. What side effects may I notice from receiving this medication? Side effects that you should report to your doctor or health care professional as soon as possible: allergic reactions like skin rash, itching or hives, swelling of the face, lips, or tongue signs  and symptoms of bleeding such as bloody or black, tarry stools; red or dark-brown urine; spitting up blood or brown material that looks like  coffee grounds; red spots on the skin; unusual bruising or bleeding from the eye, gums, or nose signs and symptoms of a stroke such as changes in vision; confusion; trouble speaking or understanding; severe headaches; sudden numbness or weakness of the face, arm, or leg; trouble walking; dizziness; loss of balance or coordination slow or fast heart rate Side effects that usually do not require medical attention (report to your doctor or health care professional if they continue or are bothersome): dizziness fever nausea, vomiting This list may not describe all possible side effects. Call your doctor for medical advice about side effects. You may report side effects to FDA at 1-800-FDA-1088. This list may not describe all possible side effects. Call your doctor for medical advice about side effects. You may report side effects to FDA at 1-800-FDA-1088. Where should I keep my medication? This does not apply. You will not be given this medicine to store at home. NOTE: This sheet is a summary. It may not cover all possible information. If you have questions about this medicine, talk to your doctor, pharmacist, or health care provider.  2024 Elsevier/Gold Standard (2012-12-26 00:00:00)  Heparin Injection What is this medication? HEPARIN INJECTION (HEP a rin) prevents and treats blood clots. It belongs to a group of medications called blood thinners. This medicine may be used for other purposes; ask your health care provider or pharmacist if you have questions. COMMON BRAND NAME(S): Hep-Lock, Hep-Lock U/P, Hepflush-10, Monoject Prefill Advanced Heparin Lock Flush, SASH Normal Saline and Heparin What should I tell my care team before I take this medication? They need to know if you have any of these conditions: Bleeding disorders, such as hemophilia or low blood platelets Bowel disease or diverticulitis Endocarditis High blood pressure Liver disease Recent surgery Stomach ulcers An unusual or  allergic reaction to heparin, other medications, foods, dyes, or preservatives Pregnant, trying to get pregnant, or recently pregnant Breastfeeding How should I use this medication? This medication is injected into a vein or under the skin. It is usually given by your care team in a hospital or clinic setting. If you get this medication at home, you will be taught how to prepare and give it. For your therapy to work as well as possible, take each dose exactly as prescribed on the prescription label. Do not skip doses. Skipping doses or stopping this medication can increase your risk of a blood clot. Keep taking this medication unless your care team tells you to stop. It is important that you put your used needles and syringes in a special sharps container. Do not put them in a trash can. If you do not have a sharps container, call your pharmacist or care team to get one. Talk to your care team about the use of this medication in children. While this medication may be prescribed for children for selected conditions, precautions do apply. Overdosage: If you think you have taken too much of this medicine contact a poison control center or emergency room at once. NOTE: This medicine is only for you. Do not share this medicine with others. What if I miss a dose? If you miss a dose, take it as soon as you can. If it is almost time for your next dose, take only that dose. Do not take double or extra doses. What may interact with this medication? Do not  take this medication with any of the following: Aspirin and aspirin-like medications Mifepristone Medications that treat or prevent blood clots, such as warfarin, enoxaparin, dalteparin Palifermin Protamine This medication may also interact with the following: Dextran Digoxin Hydroxychloroquine Medications for treating colds or allergies Nicotine NSAIDs, medications for pain and inflammation, such as ibuprofen or  naproxen Phenylbutazone Tetracycline antibiotics This list may not describe all possible interactions. Give your health care provider a list of all the medicines, herbs, non-prescription drugs, or dietary supplements you use. Also tell them if you smoke, drink alcohol, or use illegal drugs. Some items may interact with your medicine. What should I watch for while using this medication? Visit your care team for regular checks on your progress. You may need blood work while taking this medication. Your condition will be monitored carefully while you are receiving this medication. Wear a medical ID bracelet or chain, and carry a card that describes your disease and details of your medication and dosage times. Notify your care team at once if you have cold, blue hands or feet. If you are going to need surgery or other procedure, tell your care team that you are using this medication. Avoid sports and activities that might cause injury while you are using this medication. Severe falls or injuries can cause unseen bleeding. Be careful when using sharp tools or knives. Consider using an Neurosurgeon. Take special care brushing or flossing your teeth. Report any injuries, bruising, or red spots on the skin to your care team. Using this medication for a long time may weaken your bones. The risk of bone fractures may be increased. Talk to your care team about your bone health. You should make sure that you get enough calcium and vitamin D while you are taking this medication. Discuss the foods you eat and the vitamins you take with your care team. Wear a medical ID bracelet or chain. Carry a card that describes your condition. List the medications and doses you take on the card. What side effects may I notice from receiving this medication? Side effects that you should report to your care team as soon as possible: Allergic reactions--skin rash, itching, hives, swelling of the face, lips, tongue, or  throat Bleeding--bloody or black, tar-like stools, vomiting blood or brown material that looks like coffee grounds, red or dark brown urine, small red or purple spots on skin, unusual bruising or bleeding Blood clot--pain, swelling, or warmth in the leg, shortness of breath, chest pain Side effects that usually do not require medical attention (report to your care team if they continue or are bothersome): Pain, redness, or irritation at injection site This list may not describe all possible side effects. Call your doctor for medical advice about side effects. You may report side effects to FDA at 1-800-FDA-1088. Where should I keep my medication? Keep out of the reach of children and pets. Store unopened vials at room temperature between 15 and 30 degrees C (59 and 86 degrees F). Do not freeze. Do not use if solution is discolored or particulate matter is present. To get rid of medications that are no longer needed or have expired: Take the medication to a medication take-back program. Check with your pharmacy or law enforcement to find a location. If you cannot return the medication, ask your pharmacist or care team how to get rid of this medication safely. NOTE: This sheet is a summary. It may not cover all possible information. If you have questions about this medicine, talk  to your doctor, pharmacist, or health care provider.  2024 Elsevier/Gold Standard (2021-10-18 00:00:00)

## 2022-11-08 ENCOUNTER — Other Ambulatory Visit: Payer: Self-pay

## 2022-11-08 MED ORDER — SPIRONOLACTONE 25 MG PO TABS: ORAL_TABLET | ORAL | 0 refills | Status: DC

## 2022-11-12 DIAGNOSIS — I10 Essential (primary) hypertension: Secondary | ICD-10-CM | POA: Diagnosis not present

## 2022-11-12 DIAGNOSIS — K760 Fatty (change of) liver, not elsewhere classified: Secondary | ICD-10-CM | POA: Diagnosis not present

## 2022-11-18 ENCOUNTER — Other Ambulatory Visit: Payer: Self-pay

## 2022-11-18 MED ORDER — ROSUVASTATIN CALCIUM 5 MG PO TABS: ORAL_TABLET | ORAL | 3 refills | Status: DC

## 2022-11-26 DIAGNOSIS — R5382 Chronic fatigue, unspecified: Secondary | ICD-10-CM | POA: Diagnosis not present

## 2022-11-28 ENCOUNTER — Ambulatory Visit: Payer: Medicare Other

## 2022-11-28 DIAGNOSIS — Z1231 Encounter for screening mammogram for malignant neoplasm of breast: Secondary | ICD-10-CM

## 2022-12-09 ENCOUNTER — Other Ambulatory Visit: Payer: Self-pay | Admitting: Internal Medicine

## 2022-12-12 ENCOUNTER — Other Ambulatory Visit: Payer: Self-pay | Admitting: Neurology

## 2022-12-12 MED ORDER — AMPHETAMINE-DEXTROAMPHET ER 20 MG PO CP24: 20.0000 mg | ORAL_CAPSULE | Freq: Every day | ORAL | 0 refills | Status: DC

## 2022-12-12 NOTE — Telephone Encounter (Signed)
Pt request refill for amphetamine-dextroamphetamine (ADDERALL XR) 20 MG 24 hr capsule sent to [facility] #[id number]

## 2022-12-18 ENCOUNTER — Encounter: Payer: Medicare Other | Attending: Psychology | Admitting: Psychology

## 2022-12-18 DIAGNOSIS — G479 Sleep disorder, unspecified: Secondary | ICD-10-CM | POA: Diagnosis present

## 2022-12-18 DIAGNOSIS — R5383 Other fatigue: Secondary | ICD-10-CM | POA: Diagnosis present

## 2022-12-18 DIAGNOSIS — F331 Major depressive disorder, recurrent, moderate: Secondary | ICD-10-CM | POA: Insufficient documentation

## 2022-12-18 DIAGNOSIS — R29818 Other symptoms and signs involving the nervous system: Secondary | ICD-10-CM | POA: Insufficient documentation

## 2022-12-18 DIAGNOSIS — R4189 Other symptoms and signs involving cognitive functions and awareness: Secondary | ICD-10-CM | POA: Diagnosis present

## 2022-12-18 DIAGNOSIS — F4310 Post-traumatic stress disorder, unspecified: Secondary | ICD-10-CM | POA: Insufficient documentation

## 2023-01-16 ENCOUNTER — Other Ambulatory Visit: Payer: Self-pay | Admitting: Neurology

## 2023-01-16 MED ORDER — AMPHETAMINE-DEXTROAMPHET ER 20 MG PO CP24: 20.0000 mg | ORAL_CAPSULE | Freq: Every day | ORAL | 0 refills | Status: DC

## 2023-01-16 NOTE — Telephone Encounter (Addendum)
Pt request refill for amphetamine-dextroamphetamine (ADDERALL XR) 20 MG 24 hr capsule sent to [facility] #[id number] P

## 2023-01-17 ENCOUNTER — Other Ambulatory Visit: Payer: Self-pay | Admitting: Neurology

## 2023-01-17 ENCOUNTER — Telehealth: Payer: Medicare Other

## 2023-01-17 DIAGNOSIS — F4323 Adjustment disorder with mixed anxiety and depressed mood: Secondary | ICD-10-CM

## 2023-01-17 DIAGNOSIS — C801 Malignant (primary) neoplasm, unspecified: Secondary | ICD-10-CM

## 2023-01-17 NOTE — Telephone Encounter (Signed)
Pt request refill for pregabalin (LYRICA) 200 MG capsule sent to [facility] #[id number]

## 2023-01-20 ENCOUNTER — Inpatient Hospital Stay: Payer: Medicare Other | Admitting: Medical Oncology

## 2023-01-20 ENCOUNTER — Other Ambulatory Visit: Payer: Self-pay

## 2023-01-20 ENCOUNTER — Inpatient Hospital Stay: Payer: Medicare Other

## 2023-01-20 ENCOUNTER — Inpatient Hospital Stay: Payer: Medicare Other | Attending: Hematology & Oncology

## 2023-01-20 ENCOUNTER — Other Ambulatory Visit: Payer: Self-pay | Admitting: Neurology

## 2023-01-20 ENCOUNTER — Encounter: Payer: Self-pay | Admitting: Medical Oncology

## 2023-01-20 VITALS — BP 117/87 | HR 75 | Temp 98.1°F | Resp 19 | Ht 67.0 in | Wt 185.0 lb

## 2023-01-20 DIAGNOSIS — D509 Iron deficiency anemia, unspecified: Secondary | ICD-10-CM | POA: Diagnosis not present

## 2023-01-20 DIAGNOSIS — F17200 Nicotine dependence, unspecified, uncomplicated: Secondary | ICD-10-CM

## 2023-01-20 DIAGNOSIS — C801 Malignant (primary) neoplasm, unspecified: Secondary | ICD-10-CM

## 2023-01-20 DIAGNOSIS — D500 Iron deficiency anemia secondary to blood loss (chronic): Secondary | ICD-10-CM

## 2023-01-20 DIAGNOSIS — E7800 Pure hypercholesterolemia, unspecified: Secondary | ICD-10-CM | POA: Diagnosis not present

## 2023-01-20 DIAGNOSIS — F4323 Adjustment disorder with mixed anxiety and depressed mood: Secondary | ICD-10-CM

## 2023-01-20 DIAGNOSIS — Z122 Encounter for screening for malignant neoplasm of respiratory organs: Secondary | ICD-10-CM

## 2023-01-20 DIAGNOSIS — Z79620 Long term (current) use of immunosuppressive biologic: Secondary | ICD-10-CM | POA: Diagnosis not present

## 2023-01-20 DIAGNOSIS — I10 Essential (primary) hypertension: Secondary | ICD-10-CM | POA: Diagnosis not present

## 2023-01-20 LAB — CBC WITH DIFFERENTIAL (CANCER CENTER ONLY)
Abs Immature Granulocytes: 0.05 10*3/uL (ref 0.00–0.07)
Basophils Absolute: 0 10*3/uL (ref 0.0–0.1)
Basophils Relative: 0 %
Eosinophils Absolute: 0.1 10*3/uL (ref 0.0–0.5)
Eosinophils Relative: 2 %
HCT: 43.4 % (ref 36.0–46.0)
Hemoglobin: 14.1 g/dL (ref 12.0–15.0)
Immature Granulocytes: 1 %
Lymphocytes Relative: 21 %
Lymphs Abs: 1.5 10*3/uL (ref 0.7–4.0)
MCH: 29 pg (ref 26.0–34.0)
MCHC: 32.5 g/dL (ref 30.0–36.0)
MCV: 89.1 fL (ref 80.0–100.0)
Monocytes Absolute: 0.6 10*3/uL (ref 0.1–1.0)
Monocytes Relative: 8 %
Neutro Abs: 4.9 10*3/uL (ref 1.7–7.7)
Neutrophils Relative %: 68 %
Platelet Count: 213 10*3/uL (ref 150–400)
RBC: 4.87 MIL/uL (ref 3.87–5.11)
RDW: 15.9 % — ABNORMAL HIGH (ref 11.5–15.5)
WBC Count: 7.2 10*3/uL (ref 4.0–10.5)
nRBC: 0 % (ref 0.0–0.2)

## 2023-01-20 LAB — CMP (CANCER CENTER ONLY)
ALT: 38 U/L (ref 0–44)
AST: 33 U/L (ref 15–41)
Albumin: 4.2 g/dL (ref 3.5–5.0)
Alkaline Phosphatase: 108 U/L (ref 38–126)
Anion gap: 6 (ref 5–15)
BUN: 34 mg/dL — ABNORMAL HIGH (ref 8–23)
CO2: 27 mmol/L (ref 22–32)
Calcium: 9.7 mg/dL (ref 8.9–10.3)
Chloride: 104 mmol/L (ref 98–111)
Creatinine: 0.98 mg/dL (ref 0.44–1.00)
GFR, Estimated: >60 mL/min (ref >60)
Glucose, Bld: 116 mg/dL — ABNORMAL HIGH (ref 70–99)
Potassium: 4.3 mmol/L (ref 3.5–5.1)
Sodium: 137 mmol/L (ref 135–145)
Total Bilirubin: 0.2 mg/dL — ABNORMAL LOW (ref 0.3–1.2)
Total Protein: 6.7 g/dL (ref 6.5–8.1)

## 2023-01-20 LAB — RETICULOCYTES
Immature Retic Fract: 20.5 % — ABNORMAL HIGH (ref 2.3–15.9)
RBC.: 4.82 MIL/uL (ref 3.87–5.11)
Retic Count, Absolute: 105.6 10*3/uL (ref 19.0–186.0)
Retic Ct Pct: 2.2 % (ref 0.4–3.1)

## 2023-01-20 LAB — FERRITIN: Ferritin: 22 ng/mL (ref 11–307)

## 2023-01-20 MED ORDER — PREGABALIN 200 MG PO CAPS
ORAL_CAPSULE | ORAL | 5 refills | Status: DC
Start: 2023-01-20 — End: 2023-03-27

## 2023-01-20 MED ORDER — SODIUM CHLORIDE 0.9% FLUSH: 10.0000 mL | INTRAVENOUS | Status: AC | PRN

## 2023-01-20 MED ORDER — PREGABALIN 200 MG PO CAPS
200.0000 mg | ORAL_CAPSULE | Freq: Two times a day (BID) | ORAL | 0 refills | Status: DC
Start: 2023-01-20 — End: 2023-03-27

## 2023-01-20 MED ORDER — HEPARIN SOD (PORK) LOCK FLUSH 100 UNIT/ML IV SOLN: 500.0000 [IU] | Freq: Once | INTRAVENOUS | Status: AC

## 2023-01-20 NOTE — Progress Notes (Unsigned)
Hematology and Oncology Follow Up Visit  [name] [id number] 1960/08/18 62 y.o. 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  We last saw her 6 months ago.   She reports that overall she is doing ok.   She is still smoking.  Smokes a pack per day. She has not had the CT screening lung exam that was previously recommended but is open to this.   Has chronic neuropathy.  This is present but relatively controlled.  She has had no fever.  There is been no probably COVID.  She has had no bleeding.  She has had no change in bowel or bladder habits.  She has had no problems or concerns with respect to her breasts.  When we last saw her, her ferritin was 26 with an iron saturation of 16%.Her last dose of IV iron was on 02/21/2022.   Her last mammogram was back in July 2024 which showed no evidence of malignancy.   Overall, I would say performance status is probably ECOG 1.    Wt Readings from Last 3 Encounters:  01/20/23 185 lb (83.9 kg)  11/06/22 171 lb 9 oz (77.8 kg)  08/09/22 177 lb (80.3 kg)    Medications:  Allergies as of 01/20/2023       Reactions   Codeine Nausea Only   Orange Itching, Rash        Medication List        Accurate as of January 20, 2023 10:50 AM. If you have any questions, ask your nurse or doctor.          STOP taking these medications    hydrOXYzine 25 MG capsule Commonly known as: VISTARIL Stopped by: Rushie Chestnut   metFORMIN 500 MG tablet Commonly known as: GLUCOPHAGE Stopped by: Rushie Chestnut   Semaglutide (1 MG/DOSE) 4 MG/3ML Sopn Stopped by: Rushie Chestnut       TAKE these medications    amLODipine 10 MG tablet Commonly known as: NORVASC TAKE 1  TABLET(10 MG) BY MOUTH DAILY   amphetamine-dextroamphetamine 20 MG 24 hr capsule Commonly known as: Adderall XR Take 1 capsule (20 mg total) by mouth daily.   aspirin 81 MG tablet Take 81 mg by mouth daily. 2 TABS IN AM   butalbital-acetaminophen-caffeine 50-325-40 MG tablet Commonly known as: FIORICET Take 1 tablet by mouth every 6 (six) hours as needed for headache (migraine abortion). TAKE 1 TABLET BY MOUTH EVERY 6 HOURS AS NEEDED FOR HEADACHE further refills should come from Neurology team .   calcium carbonate 500 MG chewable tablet Commonly known as: TUMS - dosed in mg elemental calcium Chew 1 tablet by mouth daily as needed for indigestion or heartburn.   diclofenac 75 MG EC tablet Commonly known as: VOLTAREN TK 1 T PO BID AFTER MEALS FOR INFLAMMATION / PAIN / SWELLING PRN ONLY   donepezil 10 MG tablet Commonly known as: ARICEPT TAKE 1 TABLET(10 MG) BY MOUTH EVERY MORNING   DULoxetine 60 MG capsule Commonly known as: CYMBALTA TAKE 1 CAPSULE(60 MG) BY MOUTH DAILY   multivitamin with minerals Tabs tablet Take 1 tablet by mouth daily.   orphenadrine 100 MG tablet Commonly known as: NORFLEX TAKE ONE TABLET BY MOUTH TWICE DAILY AS NEEDED FOR MUSCLE SPASMS   pregabalin 200 MG capsule  Commonly known as: LYRICA Bid po   rosuvastatin 5 MG tablet Commonly known as: CRESTOR TAKE 1 TABLET(5 MG) BY MOUTH DAILY   spironolactone 25 MG tablet Commonly known as: ALDACTONE TAKE 1 TABLET(25 MG) BY MOUTH DAILY   Vitamin D (Ergocalciferol) 1.25 MG (50000 UNIT) Caps capsule Commonly known as: DRISDOL TAKE 1 CAPSULE BY MOUTH 1 TIME A WEEK   zolpidem 5 MG tablet Commonly known as: AMBIEN TAKE 1 TO 2 TABLETS(5 TO 10 MG) BY MOUTH EVERY NIGHT AT BEDTIME AS NEEDED        Allergies:  Allergies  Allergen Reactions   Codeine Nausea Only   Orange Itching and Rash    Past Medical History, Surgical history, Social history, and Family History were reviewed and  updated.  Review of Systems: Review of Systems  Constitutional: Negative.   HENT: Negative.    Eyes: Negative.   Respiratory: Negative.    Cardiovascular: Negative.   Gastrointestinal:  Positive for abdominal pain.  Genitourinary: Negative.   Musculoskeletal:  Positive for joint pain.  Skin: Negative.   Neurological:  Positive for tingling.  Endo/Heme/Allergies: Negative.   Psychiatric/Behavioral: Negative.        Physical Exam:  height is 5\' 7"  (1.702 m) and weight is 185 lb (83.9 kg). Her temperature is 98.1 F (36.7 C). Her blood pressure is 117/87 and her pulse is 75. Her respiration is 19 and oxygen saturation is 100%.   Wt Readings from Last 3 Encounters:  01/20/23 185 lb (83.9 kg)  11/06/22 171 lb 9 oz (77.8 kg)  08/09/22 177 lb (80.3 kg)  Her vital signs show temperature of 97.9.  Pulse 92.  Blood pressure 132/91.  Weight is 214 pounds.  Physical Exam Vitals reviewed.  Constitutional:      Comments: Declined breast exam  HENT:     Head: Normocephalic and atraumatic.  Eyes:     Pupils: Pupils are equal, round, and reactive to light.  Cardiovascular:     Rate and Rhythm: Normal rate and regular rhythm.     Heart sounds: Normal heart sounds.  Pulmonary:     Effort: Pulmonary effort is normal.     Breath sounds: Normal breath sounds.  Abdominal:     General: Bowel sounds are normal.     Palpations: Abdomen is soft.  Musculoskeletal:        General: No tenderness or deformity. Normal range of motion.     Cervical back: Normal range of motion.  Lymphadenopathy:     Cervical: No cervical adenopathy.  Skin:    General: Skin is warm and dry.     Findings: No erythema or rash.  Neurological:     Mental Status: She is alert and oriented to person, place, and time.  Psychiatric:        Behavior: Behavior normal.        Thought Content: Thought content normal.        Judgment: Judgment normal.      Lab Results  Component Value Date   WBC 7.2 01/20/2023    HGB 14.1 01/20/2023   HCT 43.4 01/20/2023   MCV 89.1 01/20/2023   PLT 213 01/20/2023   Lab Results  Component Value Date   FERRITIN 26 08/09/2022   IRON 51 08/09/2022   TIBC 319 08/09/2022   UIBC 268 08/09/2022   IRONPCTSAT 16 08/09/2022   Lab Results  Component Value Date   RETICCTPCT 2.2 01/20/2023   RBC 4.87 01/20/2023   RBC 4.82 01/20/2023  No results found for: "KPAFRELGTCHN", "LAMBDASER", "KAPLAMBRATIO" No results found for: "IGGSERUM", "IGA", "IGMSERUM" No results found for: "TOTALPROTELP", "ALBUMINELP", "A1GS", "A2GS", "BETS", "BETA2SER", "GAMS", "MSPIKE", "SPEI"   Chemistry      Component Value Date/Time   NA 138 08/09/2022 1145   NA 147 (H) 04/16/2017 1152   NA 139 12/27/2015 1156   K 3.9 08/09/2022 1145   K 3.4 04/16/2017 1152   K 3.9 12/27/2015 1156   CL 103 08/09/2022 1145   CL 101 04/16/2017 1152   CO2 24 08/09/2022 1145   CO2 30 04/16/2017 1152   CO2 27 12/27/2015 1156   BUN 25 (H) 08/09/2022 1145   BUN 18 04/16/2017 1152   BUN 14.9 12/27/2015 1156   CREATININE 0.94 08/09/2022 1145   CREATININE 1.07 (H) 03/01/2020 1626   CREATININE 0.9 12/27/2015 1156      Component Value Date/Time   CALCIUM 9.3 08/09/2022 1145   CALCIUM 9.8 04/16/2017 1152   CALCIUM 9.7 12/27/2015 1156   ALKPHOS 89 08/09/2022 1145   ALKPHOS 87 (H) 04/16/2017 1152   ALKPHOS 106 12/27/2015 1156   AST 34 08/09/2022 1145   AST 29 12/27/2015 1156   ALT 34 08/09/2022 1145   ALT 26 04/16/2017 1152   ALT 28 12/27/2015 1156   BILITOT 0.4 08/09/2022 1145   BILITOT 0.33 12/27/2015 1156       Impression and Plan: Ms. Harp is very pleasant 62 yo African American female with a remote history of a poorly differentiated carcinoma which was felt to be ovarian, BRCA +. She completed 6 cycles of Carboplatin/Taxotere over 13 years ago.   Iron studies pending- will supplement with IV iron if needed. For now she will continue an iron rich diet.   Again a screening CT scan was  recommended given her smoking history. She is agreeable. Cessation encouraged.   She wants to have her Port-A-Cath removed. We have discussed her getting her CT lung screening done prior to this procedure. She is agreeable. For now she will continued Port-A-Cath flushes every 6-8 weeks.   We will plan to get her back in 6 months. She will be due for her next mammogram in July 2025.   Disposition CT lung cancer screening order placed  Port-Flush visits every 6-8 weeks x 6 months RTC 6 months MD, port-labs   Rushie Chestnut, PA-C 9/9/202410:50 AM

## 2023-01-20 NOTE — Progress Notes (Unsigned)
Port accessed without difficulty, but unable to flush port.  On attempt to flush port, pressure felt and unable to flush port.  Pt denies any pain with flushing. S. Covington  notified. Labs drawn peripherally.

## 2023-01-20 NOTE — Telephone Encounter (Signed)
Pt checking on refill request.  Have appt schedule on 03/27/23 at 2:30pm

## 2023-01-20 NOTE — Telephone Encounter (Signed)
Refill will be sent to the MD but patient should set up a follow up appointment. Last seen march 2024 and recommended f/u was 6-8 months

## 2023-01-21 LAB — IRON AND IRON BINDING CAPACITY (CC-WL,HP ONLY)
Iron: 55 ug/dL (ref 28–170)
Saturation Ratios: 15 % (ref 10.4–31.8)
TIBC: 378 ug/dL (ref 250–450)
UIBC: 323 ug/dL (ref 148–442)

## 2023-01-21 LAB — LACTATE DEHYDROGENASE: LDH: 187 U/L (ref 98–192)

## 2023-01-22 ENCOUNTER — Encounter: Payer: Self-pay | Admitting: Hematology & Oncology

## 2023-01-23 ENCOUNTER — Encounter: Payer: Self-pay | Admitting: *Deleted

## 2023-01-27 ENCOUNTER — Inpatient Hospital Stay: Payer: Medicare Other

## 2023-01-27 VITALS — BP 138/90 | HR 72 | Temp 97.9°F | Resp 18 | Ht 67.0 in | Wt 180.2 lb

## 2023-01-27 DIAGNOSIS — Z95828 Presence of other vascular implants and grafts: Secondary | ICD-10-CM

## 2023-01-27 DIAGNOSIS — D500 Iron deficiency anemia secondary to blood loss (chronic): Secondary | ICD-10-CM

## 2023-01-27 DIAGNOSIS — C801 Malignant (primary) neoplasm, unspecified: Secondary | ICD-10-CM

## 2023-01-27 DIAGNOSIS — Z79620 Long term (current) use of immunosuppressive biologic: Secondary | ICD-10-CM | POA: Diagnosis not present

## 2023-01-27 DIAGNOSIS — D509 Iron deficiency anemia, unspecified: Secondary | ICD-10-CM | POA: Diagnosis not present

## 2023-01-27 MED ORDER — SODIUM CHLORIDE 0.9 % IV SOLN
200.0000 mg | Freq: Once | INTRAVENOUS | Status: AC
  Administered 2023-01-27: 200 mg via INTRAVENOUS
  Filled 2023-01-27: qty 10

## 2023-01-27 MED ORDER — SODIUM CHLORIDE 0.9 % IV SOLN: Freq: Once | INTRAVENOUS | Status: AC

## 2023-01-27 NOTE — Patient Instructions (Signed)
Iron Sucrose Injection What is this medication? IRON SUCROSE (EYE ern SOO krose) treats low levels of iron (iron deficiency anemia) in people with kidney disease. Iron is a mineral that plays an important role in making red blood cells, which carry oxygen from your lungs to the rest of your body. This medicine may be used for other purposes; ask your health care provider or pharmacist if you have questions. COMMON BRAND NAME(S): Venofer What should I tell my care team before I take this medication? They need to know if you have any of these conditions: Anemia not caused by low iron levels Heart disease High levels of iron in the blood Kidney disease Liver disease An unusual or allergic reaction to iron, other medications, foods, dyes, or preservatives Pregnant or trying to get pregnant Breastfeeding How should I use this medication? This medication is for infusion into a vein. It is given in a hospital or clinic setting. Talk to your care team about the use of this medication in children. While this medication may be prescribed for children as young as 2 years for selected conditions, precautions do apply. Overdosage: If you think you have taken too much of this medicine contact a poison control center or emergency room at once. NOTE: This medicine is only for you. Do not share this medicine with others. What if I miss a dose? Keep appointments for follow-up doses. It is important not to miss your dose. Call your care team if you are unable to keep an appointment. What may interact with this medication? Do not take this medication with any of the following: Deferoxamine Dimercaprol Other iron products This medication may also interact with the following: Chloramphenicol Deferasirox This list may not describe all possible interactions. Give your health care provider a list of all the medicines, herbs, non-prescription drugs, or dietary supplements you use. Also tell them if you smoke,  drink alcohol, or use illegal drugs. Some items may interact with your medicine. What should I watch for while using this medication? Visit your care team regularly. Tell your care team if your symptoms do not start to get better or if they get worse. You may need blood work done while you are taking this medication. You may need to follow a special diet. Talk to your care team. Foods that contain iron include: whole grains/cereals, dried fruits, beans, or peas, leafy green vegetables, and organ meats (liver, kidney). What side effects may I notice from receiving this medication? Side effects that you should report to your care team as soon as possible: Allergic reactions--skin rash, itching, hives, swelling of the face, lips, tongue, or throat Low blood pressure--dizziness, feeling faint or lightheaded, blurry vision Shortness of breath Side effects that usually do not require medical attention (report to your care team if they continue or are bothersome): Flushing Headache Joint pain Muscle pain Nausea Pain, redness, or irritation at injection site This list may not describe all possible side effects. Call your doctor for medical advice about side effects. You may report side effects to FDA at 1-800-FDA-1088. Where should I keep my medication? This medication is given in a hospital or clinic. It will not be stored at home. NOTE: This sheet is a summary. It may not cover all possible information. If you have questions about this medicine, talk to your doctor, pharmacist, or health care provider.  2024 Elsevier/Gold Standard (2022-10-04 00:00:00)

## 2023-01-29 ENCOUNTER — Other Ambulatory Visit: Payer: Self-pay | Admitting: Hematology & Oncology

## 2023-01-29 DIAGNOSIS — C801 Malignant (primary) neoplasm, unspecified: Secondary | ICD-10-CM

## 2023-02-03 ENCOUNTER — Inpatient Hospital Stay: Payer: Medicare Other

## 2023-02-03 ENCOUNTER — Ambulatory Visit (HOSPITAL_BASED_OUTPATIENT_CLINIC_OR_DEPARTMENT_OTHER)
Admission: RE | Admit: 2023-02-03 | Discharge: 2023-02-03 | Disposition: A | Discharge status: Home / Self Care | Payer: Medicare Other | Source: Ambulatory Visit | Attending: Medical Oncology | Admitting: Medical Oncology

## 2023-02-03 VITALS — BP 144/90 | HR 70 | Temp 98.1°F | Resp 17

## 2023-02-03 DIAGNOSIS — I700 Atherosclerosis of aorta: Secondary | ICD-10-CM | POA: Diagnosis not present

## 2023-02-03 DIAGNOSIS — Z122 Encounter for screening for malignant neoplasm of respiratory organs: Secondary | ICD-10-CM | POA: Insufficient documentation

## 2023-02-03 DIAGNOSIS — D500 Iron deficiency anemia secondary to blood loss (chronic): Secondary | ICD-10-CM

## 2023-02-03 DIAGNOSIS — F17210 Nicotine dependence, cigarettes, uncomplicated: Secondary | ICD-10-CM | POA: Diagnosis not present

## 2023-02-03 DIAGNOSIS — J439 Emphysema, unspecified: Secondary | ICD-10-CM | POA: Insufficient documentation

## 2023-02-03 DIAGNOSIS — F17200 Nicotine dependence, unspecified, uncomplicated: Secondary | ICD-10-CM | POA: Insufficient documentation

## 2023-02-03 DIAGNOSIS — C801 Malignant (primary) neoplasm, unspecified: Secondary | ICD-10-CM

## 2023-02-03 DIAGNOSIS — K449 Diaphragmatic hernia without obstruction or gangrene: Secondary | ICD-10-CM | POA: Diagnosis not present

## 2023-02-03 DIAGNOSIS — Z95828 Presence of other vascular implants and grafts: Secondary | ICD-10-CM

## 2023-02-03 MED ORDER — SODIUM CHLORIDE 0.9 % IV SOLN: Freq: Once | INTRAVENOUS | Status: AC

## 2023-02-03 MED ORDER — SODIUM CHLORIDE 0.9 % IV SOLN
200.0000 mg | Freq: Once | INTRAVENOUS | Status: AC
  Administered 2023-02-03: 200 mg via INTRAVENOUS
  Filled 2023-02-03: qty 200

## 2023-02-03 NOTE — Patient Instructions (Signed)
Iron Sucrose Injection What is this medication? IRON SUCROSE (EYE ern SOO krose) treats low levels of iron (iron deficiency anemia) in people with kidney disease. Iron is a mineral that plays an important role in making red blood cells, which carry oxygen from your lungs to the rest of your body. This medicine may be used for other purposes; ask your health care provider or pharmacist if you have questions. COMMON BRAND NAME(S): Venofer What should I tell my care team before I take this medication? They need to know if you have any of these conditions: Anemia not caused by low iron levels Heart disease High levels of iron in the blood Kidney disease Liver disease An unusual or allergic reaction to iron, other medications, foods, dyes, or preservatives Pregnant or trying to get pregnant Breastfeeding How should I use this medication? This medication is for infusion into a vein. It is given in a hospital or clinic setting. Talk to your care team about the use of this medication in children. While this medication may be prescribed for children as young as 2 years for selected conditions, precautions do apply. Overdosage: If you think you have taken too much of this medicine contact a poison control center or emergency room at once. NOTE: This medicine is only for you. Do not share this medicine with others. What if I miss a dose? Keep appointments for follow-up doses. It is important not to miss your dose. Call your care team if you are unable to keep an appointment. What may interact with this medication? Do not take this medication with any of the following: Deferoxamine Dimercaprol Other iron products This medication may also interact with the following: Chloramphenicol Deferasirox This list may not describe all possible interactions. Give your health care provider a list of all the medicines, herbs, non-prescription drugs, or dietary supplements you use. Also tell them if you smoke,  drink alcohol, or use illegal drugs. Some items may interact with your medicine. What should I watch for while using this medication? Visit your care team regularly. Tell your care team if your symptoms do not start to get better or if they get worse. You may need blood work done while you are taking this medication. You may need to follow a special diet. Talk to your care team. Foods that contain iron include: whole grains/cereals, dried fruits, beans, or peas, leafy green vegetables, and organ meats (liver, kidney). What side effects may I notice from receiving this medication? Side effects that you should report to your care team as soon as possible: Allergic reactions--skin rash, itching, hives, swelling of the face, lips, tongue, or throat Low blood pressure--dizziness, feeling faint or lightheaded, blurry vision Shortness of breath Side effects that usually do not require medical attention (report to your care team if they continue or are bothersome): Flushing Headache Joint pain Muscle pain Nausea Pain, redness, or irritation at injection site This list may not describe all possible side effects. Call your doctor for medical advice about side effects. You may report side effects to FDA at 1-800-FDA-1088. Where should I keep my medication? This medication is given in a hospital or clinic. It will not be stored at home. NOTE: This sheet is a summary. It may not cover all possible information. If you have questions about this medicine, talk to your doctor, pharmacist, or health care provider.  2024 Elsevier/Gold Standard (2022-10-04 00:00:00)

## 2023-02-04 ENCOUNTER — Encounter: Payer: Self-pay | Admitting: Psychology
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
## 2023-02-05 ENCOUNTER — Encounter: Payer: Self-pay | Admitting: Hematology & Oncology

## 2023-02-06 ENCOUNTER — Other Ambulatory Visit: Payer: Self-pay | Admitting: Family

## 2023-02-06 ENCOUNTER — Other Ambulatory Visit: Payer: Self-pay | Admitting: Hematology & Oncology

## 2023-02-06 DIAGNOSIS — C801 Malignant (primary) neoplasm, unspecified: Secondary | ICD-10-CM

## 2023-02-10 ENCOUNTER — Encounter: Payer: Self-pay | Admitting: Neurology

## 2023-02-10 ENCOUNTER — Other Ambulatory Visit: Payer: Self-pay | Admitting: *Deleted

## 2023-02-10 ENCOUNTER — Telehealth: Payer: Self-pay | Admitting: *Deleted

## 2023-02-10 DIAGNOSIS — M542 Cervicalgia: Secondary | ICD-10-CM

## 2023-02-10 DIAGNOSIS — Z95828 Presence of other vascular implants and grafts: Secondary | ICD-10-CM

## 2023-02-10 DIAGNOSIS — G622 Polyneuropathy due to other toxic agents: Secondary | ICD-10-CM

## 2023-02-10 DIAGNOSIS — R29818 Other symptoms and signs involving the nervous system: Secondary | ICD-10-CM

## 2023-02-10 DIAGNOSIS — G4701 Insomnia due to medical condition: Secondary | ICD-10-CM

## 2023-02-10 DIAGNOSIS — R4189 Other symptoms and signs involving cognitive functions and awareness: Secondary | ICD-10-CM

## 2023-02-10 NOTE — Telephone Encounter (Signed)
Patient called wanting results of CT scan which had not been read yet.  Called reading room to have it read.  Patient then said she wants to have her portacath removed and replaced because it has not been working properly and is 62 years old.    Order placed.

## 2023-02-11 ENCOUNTER — Other Ambulatory Visit: Payer: Self-pay | Admitting: Internal Medicine

## 2023-02-17 ENCOUNTER — Encounter: Payer: Self-pay | Admitting: Hematology & Oncology

## 2023-02-17 DIAGNOSIS — I700 Atherosclerosis of aorta: Secondary | ICD-10-CM

## 2023-02-17 DIAGNOSIS — J438 Other emphysema: Secondary | ICD-10-CM

## 2023-02-22 ENCOUNTER — Encounter: Payer: Self-pay | Admitting: Neurology

## 2023-02-24 ENCOUNTER — Other Ambulatory Visit: Payer: Self-pay

## 2023-02-24 DIAGNOSIS — M25511 Pain in right shoulder: Secondary | ICD-10-CM | POA: Diagnosis not present

## 2023-02-24 DIAGNOSIS — M25562 Pain in left knee: Secondary | ICD-10-CM | POA: Diagnosis not present

## 2023-02-24 DIAGNOSIS — M25512 Pain in left shoulder: Secondary | ICD-10-CM | POA: Diagnosis not present

## 2023-02-24 NOTE — Telephone Encounter (Signed)
Called patient and LVM to give [place] a call back

## 2023-02-25 ENCOUNTER — Other Ambulatory Visit (HOSPITAL_COMMUNITY): Payer: Self-pay

## 2023-02-25 DIAGNOSIS — I10 Essential (primary) hypertension: Secondary | ICD-10-CM | POA: Diagnosis not present

## 2023-02-25 MED ORDER — AMPHETAMINE-DEXTROAMPHET ER 20 MG PO CP24
20.0000 mg | ORAL_CAPSULE | Freq: Every day | ORAL | 0 refills | Status: DC
Start: 2023-02-25 — End: 2023-03-27
  Filled 2023-02-25: qty 30, 30d supply, fill #0

## 2023-02-25 NOTE — Addendum Note (Signed)
Addended by: Judi Cong on: 02/25/2023 08:33 AM   Modules accepted: Orders

## 2023-02-26 ENCOUNTER — Other Ambulatory Visit (HOSPITAL_COMMUNITY): Payer: Self-pay

## 2023-02-28 ENCOUNTER — Other Ambulatory Visit (HOSPITAL_COMMUNITY): Payer: Self-pay

## 2023-03-05 ENCOUNTER — Other Ambulatory Visit: Payer: Self-pay

## 2023-03-05 MED ORDER — AMLODIPINE BESYLATE 10 MG PO TABS: 10.0000 mg | ORAL_TABLET | Freq: Every day | ORAL | 1 refills | Status: DC

## 2023-03-06 ENCOUNTER — Other Ambulatory Visit: Payer: Self-pay | Admitting: Radiology

## 2023-03-06 NOTE — H&P (Signed)
Referring Physician(s): Ennever,Peter R  Supervising Physician: Oley Balm  Patient Status:  [facility] OP  Chief Complaint: "I'm getting a new port"   Subjective: Pt known to IR team from [place] in 2007, port a cath placement 2010, aspiration and alcohol ablation/doxycycline sclerosis of left inguinal seroma 2010, port a cath tip repositioning 2015, and port a cath injection 03/2022 which was noted to both aspirate and flush, though the aspiration was noted to be slightly sluggish and the flushing was met with slight resistance. 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 She is scheduled today for port a cath removal and new port placement. She denies fever,HA,CP,dyspnea, cough, abd/back pain,N/V or bleeding.    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   Clotting disorder (HCC)    GERD (gastroesophageal reflux disease)    HLD (hyperlipidemia)    Hx pulmonary embolism    Hypertension    Iron deficiency anemia due to chronic blood loss 11/04/2016   Metrorrhagia    Neuromuscular disorder (HCC)    Neuropathy    Obesity    Ovarian cancer (HCC) 04/2008   ChemoTherapy   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   Raynaud's syndrome    Right groin mass    Squamous cell carcinoma of skin    Ulcerative colitis (HCC)    Vision abnormalities    Vitamin D deficiency    Past Surgical History:  Procedure Laterality Date   ABDOMINAL HYSTERECTOMY  05/2008   TAH/BSO   AUGMENTATION MAMMAPLASTY Bilateral 10+ years   BREAST BIOPSY Left 2020   BREAST BIOPSY Right     BREAST REDUCTION SURGERY  2000   CESAREAN SECTION     1991   COLONOSCOPY  2004; 12/07/10   hemorrhoids   ESOPHAGOGASTRODUODENOSCOPY     GASTRIC BYPASS  1979   IR CV LINE INJECTION  03/13/2022   REDUCTION MAMMAPLASTY Bilateral    TOENAIL EXCISION Bilateral    TONSILLECTOMY        Allergies: Codeine and Orange  Medications: Prior to Admission medications   Medication Sig Start Date End Date Taking? Authorizing Provider  amLODipine (NORVASC) 10 MG tablet Take 1 tablet (10 mg total) by mouth daily. 03/05/23   Panosh, Neta Mends, MD  amphetamine-dextroamphetamine (ADDERALL XR) 20 MG 24 hr capsule Take 1 capsule (20 mg total) by mouth daily. 02/25/23   Windell Norfolk, MD  aspirin 81 MG tablet Take 81 mg by mouth daily. 2 TABS IN AM    [provider]  butalbital-acetaminophen-caffeine (FIORICET) 50-325-40 MG tablet Take 1 tablet by mouth every 6 (six) hours as needed for headache (migraine abortion). TAKE 1 TABLET BY MOUTH EVERY 6 HOURS AS NEEDED FOR HEADACHE further refills should come from Neurology team . 11/22/20   Dohmeier, Porfirio Mylar, MD  calcium carbonate (TUMS - DOSED IN MG ELEMENTAL CALCIUM) 500 MG chewable tablet Chew 1 tablet by mouth daily as needed for indigestion or heartburn.    [provider]  diclofenac (VOLTAREN) 75 MG EC tablet TK 1 T PO BID AFTER  MEALS FOR INFLAMMATION / PAIN / SWELLING PRN ONLY 12/16/16   [provider]  donepezil (ARICEPT) 10 MG tablet TAKE 1 TABLET(10 MG) BY MOUTH EVERY MORNING 08/07/22   Lomax, Amy, NP  DULoxetine (CYMBALTA) 60 MG capsule TAKE 1 CAPSULE(60 MG) BY MOUTH DAILY 02/06/23   Josph Macho, MD  Multiple Vitamin (MULTIVITAMIN WITH MINERALS) TABS tablet Take 1 tablet by mouth daily.    [provider]  orphenadrine (NORFLEX) 100 MG tablet TAKE ONE TABLET BY MOUTH TWICE DAILY AS NEEDED FOR MUSCLE SPASMS 12/10/22   Worthy Rancher B, FNP  pregabalin (LYRICA) 200 MG capsule Take 1 capsule (200 mg total) by mouth 2 (two)  times daily. 01/20/23   Dohmeier, Porfirio Mylar, MD  pregabalin (LYRICA) 200 MG capsule Bid po 01/20/23   Dohmeier, Porfirio Mylar, MD  rosuvastatin (CRESTOR) 5 MG tablet TAKE 1 TABLET(5 MG) BY MOUTH DAILY 11/18/22   Panosh, Neta Mends, MD  spironolactone (ALDACTONE) 25 MG tablet TAKE 1 TABLET(25 MG) BY MOUTH DAILY 02/11/23   Worthy Rancher B, FNP  Vitamin D, Ergocalciferol, (DRISDOL) 1.25 MG (50000 UNIT) CAPS capsule TAKE 1 CAPSULE BY MOUTH 1 TIME A WEEK 08/21/22   Ennever, Rose Phi, MD  zolpidem (AMBIEN) 5 MG tablet TAKE 1 TO 2 TABLETS(5 TO 10 MG) BY MOUTH EVERY NIGHT AT BEDTIME AS NEEDED 10/29/22   Dohmeier, Porfirio Mylar, MD     Vital Signs:pending    Code Status: FULL CODE  Physical Exam: awake/alert; chest- CTA bilat; clean, intact rt chest port a cath;  heart- RRR; abd-soft,+BS,NT; trace pretibial edema bilat  Imaging: No results found.  Labs:  CBC: Recent Labs    08/09/22 1145 01/20/23 0948  WBC 6.2 7.2  HGB 13.1 14.1  HCT 39.7 43.4  PLT 184 213    COAGS: No results for input(s): "INR", "APTT" in the last 8760 hours.  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 She is s/p [place] in 2007, port a cath placement 2010, aspiration and alcohol ablation/doxycycline sclerosis of left inguinal seroma 2010, port a cath tip repositioning 2015, and port a cath injection 03/2022 which was noted to both aspirate and flush, though the aspiration was noted to be slightly sluggish and  the flushing was met with slight resistance.She is scheduled today for port a cath removal and new port placement. Risks and benefits of image guided port-a-catheter removal/placement was discussed with the patient including, but not limited to bleeding, infection, pneumothorax, or fibrin sheath development and need for additional procedures.  All of the patient's questions were answered, patient is agreeable to proceed. Consent signed and in chart.    Electronically Signed: D. Jeananne Rama, PA-C 03/06/2023, 2:12 PM   I spent a total of 25 minutes at the the patient's bedside AND on the patient's hospital floor or unit, greater than 50% of which was  counseling/coordinating care for port a cath removal/new port a cath placement

## 2023-03-07 ENCOUNTER — Ambulatory Visit (HOSPITAL_COMMUNITY)
Admission: RE | Admit: 2023-03-07 | Discharge: 2023-03-07 | Disposition: A | Discharge status: Home / Self Care | Payer: Medicare Other | Source: Ambulatory Visit | Attending: Hematology & Oncology | Admitting: Hematology & Oncology

## 2023-03-07 ENCOUNTER — Ambulatory Visit (HOSPITAL_COMMUNITY)
Admission: RE | Admit: 2023-03-07 | Discharge: 2023-03-07 | Disposition: A | Discharge status: Home / Self Care | Payer: Medicare Other | Source: Ambulatory Visit | Attending: Hematology & Oncology

## 2023-03-07 DIAGNOSIS — K219 Gastro-esophageal reflux disease without esophagitis: Secondary | ICD-10-CM | POA: Insufficient documentation

## 2023-03-07 DIAGNOSIS — I7300 Raynaud's syndrome without gangrene: Secondary | ICD-10-CM | POA: Diagnosis not present

## 2023-03-07 DIAGNOSIS — Z452 Encounter for adjustment and management of vascular access device: Secondary | ICD-10-CM | POA: Diagnosis not present

## 2023-03-07 DIAGNOSIS — F419 Anxiety disorder, unspecified: Secondary | ICD-10-CM | POA: Insufficient documentation

## 2023-03-07 DIAGNOSIS — I10 Essential (primary) hypertension: Secondary | ICD-10-CM | POA: Diagnosis not present

## 2023-03-07 DIAGNOSIS — T82598A Other mechanical complication of other cardiac and vascular devices and implants, initial encounter: Secondary | ICD-10-CM | POA: Diagnosis not present

## 2023-03-07 DIAGNOSIS — C801 Malignant (primary) neoplasm, unspecified: Secondary | ICD-10-CM | POA: Diagnosis not present

## 2023-03-07 DIAGNOSIS — Z8543 Personal history of malignant neoplasm of ovary: Secondary | ICD-10-CM | POA: Diagnosis not present

## 2023-03-07 DIAGNOSIS — Z85828 Personal history of other malignant neoplasm of skin: Secondary | ICD-10-CM | POA: Insufficient documentation

## 2023-03-07 DIAGNOSIS — F32A Depression, unspecified: Secondary | ICD-10-CM | POA: Insufficient documentation

## 2023-03-07 DIAGNOSIS — E785 Hyperlipidemia, unspecified: Secondary | ICD-10-CM | POA: Insufficient documentation

## 2023-03-07 DIAGNOSIS — Z95828 Presence of other vascular implants and grafts: Secondary | ICD-10-CM

## 2023-03-07 HISTORY — PX: IR IMAGING GUIDED PORT INSERTION: IMG5740

## 2023-03-07 MED ORDER — HEPARIN SOD (PORK) LOCK FLUSH 100 UNIT/ML IV SOLN
500.0000 [IU] | Freq: Once | INTRAVENOUS | Status: AC
  Administered 2023-03-07: 500 [IU] via INTRAVENOUS

## 2023-03-07 MED ORDER — FENTANYL CITRATE (PF) 100 MCG/2ML IJ SOLN
INTRAMUSCULAR | Status: AC
  Filled 2023-03-07: qty 2

## 2023-03-07 MED ORDER — FENTANYL CITRATE (PF) 100 MCG/2ML IJ SOLN
INTRAMUSCULAR | Status: AC | PRN
Start: 2023-03-07 — End: 2023-03-07
  Administered 2023-03-07 (×2): 50 ug via INTRAVENOUS

## 2023-03-07 MED ORDER — SODIUM CHLORIDE 0.9 % IV SOLN: INTRAVENOUS | Status: DC

## 2023-03-07 MED ORDER — MIDAZOLAM HCL 2 MG/2ML IJ SOLN
INTRAMUSCULAR | Status: AC
  Filled 2023-03-07: qty 2

## 2023-03-07 MED ORDER — CARMEX CLASSIC LIP BALM EX OINT
TOPICAL_OINTMENT | CUTANEOUS | Status: AC
  Filled 2023-03-07: qty 10

## 2023-03-07 MED ORDER — LIDOCAINE-EPINEPHRINE 1 %-1:100000 IJ SOLN
INTRAMUSCULAR | Status: AC
  Filled 2023-03-07: qty 1

## 2023-03-07 MED ORDER — MIDAZOLAM HCL 2 MG/2ML IJ SOLN
INTRAMUSCULAR | Status: AC | PRN
  Administered 2023-03-07 (×2): 1 mg via INTRAVENOUS

## 2023-03-07 MED ORDER — HEPARIN SOD (PORK) LOCK FLUSH 100 UNIT/ML IV SOLN
INTRAVENOUS | Status: AC
  Filled 2023-03-07: qty 5

## 2023-03-07 MED ORDER — LIDOCAINE-EPINEPHRINE 1 %-1:100000 IJ SOLN
20.0000 mL | Freq: Once | INTRAMUSCULAR | Status: AC
  Administered 2023-03-07: 20 mL via INTRADERMAL

## 2023-03-07 NOTE — Procedures (Signed)
  Procedure:  Revision/exchange R internal jugular port catheter   Preprocedure diagnosis: The encounter diagnosis was Port-A-Cath in place. Postprocedure diagnosis: same EBL:    minimal Complications:   none immediate  See full dictation in [employer].  Thora Lance MD Main # [phone] Pager  [phone] Mobile [phone]

## 2023-03-07 NOTE — Discharge Instructions (Signed)
Implanted Port Insertion, Care After  The following information offers guidance on how to care for yourself after your procedure. Your health care provider may also give you more specific instructions. If you have problems or questions, contact your health care provider.  What can I expect after the procedure? After the procedure, it is common to have: Discomfort at the port insertion site. Bruising on the skin over the port. This should improve over 3-4 days.   Urgent needs - Interventional Radiology, clinic [phone] (mon-fri 8-5).   Wound - May remove dressing and shower in 24 to 48 hours.  Keep site clean and dry.  Replace with bandaid as needed.  Do not submerge in tub or water until site healing well. If closed with glue, glue will flake off on its own.   If ordered by your provider, may start Emla cream (or any other creams ointments or lotions) in 2 weeks or after incision is healed. Port is ready for use immediately.   After completion of treatment, your provider should have you set up for monthly port flushes.   Follow these instructions at home: Port care After your port is placed, you will get a manufacturer's information card. The card has information about your port. Keep this card with you at all times. Take care of the port as told by your health care provider. Ask your health care provider if you or a family member can get training for taking care of the port at home. A home health care nurse will be be available to help care for the port. Make sure to remember what type of port you have. Incision care     Follow instructions from your health care provider about how to take care of your port insertion site. Make sure you: Wash your hands with soap and water for at least 20 seconds before and after you change your bandage (dressing). If soap and water are not available, use hand sanitizer. Change your dressing as told by your health care provider. Leave stitches  (sutures), skin glue, or adhesive strips in place. These skin closures may need to stay in place for 2 weeks or longer. If adhesive strip edges start to loosen and curl up, you may trim the loose edges. Do not remove adhesive strips completely unless your health care provider tells you to do that. Check your port insertion site every day for signs of infection. Check for: Redness, swelling, or pain. Fluid or blood. Warmth. Pus or a bad smell. Activity Return to your normal activities as told by your health care provider. Ask your health care provider what activities are safe for you. You may have to avoid lifting. Ask your health care provider how much you can safely lift. General instructions Take over-the-counter and prescription medicines only as told by your health care provider. Do not take baths, swim, or use a hot tub until your health care provider approves. Ask your health care provider if you may take showers. You may only be allowed to take sponge baths. If you were given a sedative during the procedure, it can affect you for several hours. Do not drive or operate machinery until your health care provider says that it is safe. Wear a medical alert bracelet in case of an emergency. This will tell any health care providers that you have a port. Keep all follow-up visits. This is important. Contact a health care provider if: You cannot flush your port with saline as directed, or you cannot   draw blood from the port. You have a fever or chills. You have redness, swelling, or pain around your port insertion site. You have fluid or blood coming from your port insertion site. Your port insertion site feels warm to the touch. You have pus or a bad smell coming from the port insertion site. Get help right away if: You have chest pain or shortness of breath. You have bleeding from your port that you cannot control. These symptoms may be an emergency. Get help right away. Call 911. Do not  wait to see if the symptoms will go away. Do not drive yourself to the hospital. Summary Take care of the port as told by your health care provider. Keep the manufacturer's information card with you at all times. Change your dressing as told by your health care provider. Contact a health care provider if you have a fever or chills or if you have redness, swelling, or pain around your port insertion site. Keep all follow-up visits. This information is not intended to replace advice given to you by your health care provider. Make sure you discuss any questions you have with your health care provider. Document Revised: 10/31/2020 Document Reviewed: 10/31/2020 Elsevier Patient Education  2023 Else[employer] Inc[employer]    Moderate Conscious Sedation  Adult  Care After (English)  After the procedure, it is common to have: Sleepiness for a few hours. Impaired judgment for a few hours. Trouble with balance. Nausea or vomiting if you eat too soon. Follow these instructions at home: For the time period you were told by your health care provider:  Rest. Do not participate in activities where you could fall or become injured. Do not drive or use machinery. Do not drink alcohol. Do not take sleeping pills or medicines that cause drowsiness. Do not make important decisions or sign legal documents. Do not take care of children on your own. Eating and drinking Follow instructions from your health care provider about what you may eat and drink. Drink enough fluid to keep your urine pale yellow. If you vomit: Drink clear fluids slowly and in small amounts as you are able. Clear fluids include water, ice chips, low-calorie sports drinks, and fruit juice that has water added to it (diluted fruit juice). Eat light and bland foods in small amounts as you are able. These foods include bananas, applesauce, rice, lean meats, toast, and crackers. General instructions Take over-the-counter and prescription medicines  only as told by your health care provider. Have a responsible adult stay with you for the time you are told. Do not use any products that contain nicotine or tobacco. These products include cigarettes, chewing tobacco, and vaping devices, such as e-cigarettes. If you need help quitting, ask your health care provider. Return to your normal activities as told by your health care provider. Ask your health care provider what activities are safe for you. Your health care provider may give you more instructions. Make sure you know what you can and cannot do. Contact a health care provider if: You are still sleepy or having trouble with balance after 24 hours. You feel light-headed. You vomit every time you eat or drink. You get a rash. You have a fever. You have redness or swelling around the IV site. Get help right away if: You have trouble breathing. You start to feel confused at home. These symptoms may be an emergency. Get help right away. Call 911. Do not wait to see if the symptoms will go away. Do not   drive yourself to the hospital. This information is not intended to replace advice given to you by your health care provider. Make sure you discuss any questions you have with your health care provider. 

## 2023-03-18 ENCOUNTER — Other Ambulatory Visit: Payer: Self-pay | Admitting: Hematology & Oncology

## 2023-03-18 ENCOUNTER — Encounter (HOSPITAL_COMMUNITY): Payer: Self-pay | Admitting: Radiology

## 2023-03-18 DIAGNOSIS — Z95828 Presence of other vascular implants and grafts: Secondary | ICD-10-CM

## 2023-03-19 ENCOUNTER — Other Ambulatory Visit: Payer: Self-pay | Admitting: Hematology & Oncology

## 2023-03-19 DIAGNOSIS — C801 Malignant (primary) neoplasm, unspecified: Secondary | ICD-10-CM

## 2023-03-27 ENCOUNTER — Encounter: Payer: Self-pay | Admitting: Neurology

## 2023-03-27 ENCOUNTER — Ambulatory Visit: Payer: Medicare Other | Admitting: Neurology

## 2023-03-27 DIAGNOSIS — H8113 Benign paroxysmal vertigo, bilateral: Secondary | ICD-10-CM | POA: Diagnosis not present

## 2023-03-27 DIAGNOSIS — C801 Malignant (primary) neoplasm, unspecified: Secondary | ICD-10-CM

## 2023-03-27 DIAGNOSIS — G4701 Insomnia due to medical condition: Secondary | ICD-10-CM | POA: Diagnosis not present

## 2023-03-27 DIAGNOSIS — G43109 Migraine with aura, not intractable, without status migrainosus: Secondary | ICD-10-CM | POA: Diagnosis not present

## 2023-03-27 DIAGNOSIS — G63 Polyneuropathy in diseases classified elsewhere: Secondary | ICD-10-CM

## 2023-03-27 DIAGNOSIS — F4323 Adjustment disorder with mixed anxiety and depressed mood: Secondary | ICD-10-CM

## 2023-03-27 DIAGNOSIS — H53143 Visual discomfort, bilateral: Secondary | ICD-10-CM

## 2023-03-27 MED ORDER — AMPHETAMINE-DEXTROAMPHET ER 20 MG PO CP24: 20.0000 mg | ORAL_CAPSULE | Freq: Every day | ORAL | 0 refills | Status: DC

## 2023-03-27 MED ORDER — DONEPEZIL HCL 10 MG PO TABS: ORAL_TABLET | ORAL | 3 refills | Status: DC

## 2023-03-27 MED ORDER — DULOXETINE HCL 60 MG PO CPEP: 60.0000 mg | ORAL_CAPSULE | Freq: Every day | ORAL | 3 refills | Status: DC

## 2023-03-27 MED ORDER — ZOLPIDEM TARTRATE 5 MG PO TABS: ORAL_TABLET | ORAL | 2 refills | Status: DC

## 2023-03-27 MED ORDER — PREGABALIN 200 MG PO CAPS
200.0000 mg | ORAL_CAPSULE | Freq: Two times a day (BID) | ORAL | 1 refills | Status: DC
Start: 2023-03-27 — End: 2023-09-24

## 2023-03-27 NOTE — Progress Notes (Signed)
Provider:  Melvyn Novas, MD  Primary Care Physician:  Madelin Headings, MD [address] [place] [address]     Referring Provider: Madelin Headings, Md [address] [place],  [place] [address]          Chief Complaint according to patient   Patient presents with:                HISTORY OF PRESENT ILLNESS:  [name] is a 62 y.o. female patient who is here for revisit 03/27/2023 for  subjective memory loss, but also depression, anxiety. .  Chief concern according to patient :  She reports feeling depressed, overwhelmed and limits her social contacts and activities.  MOCA today was good- 26/ 30     [name] is a 62 y.o. female Ovarian cancer patient who is here for revisit 07/16/2022 for  memory loss and neuropathy. .  Chief concern according to patient :  07-16-2022: The patient has been losing weight , on Ozempic , she lost 35 pounds ! Prediabetes is reversed.  She  has a new neuropathy symptom, this is pin and needles starting at the hand and radiating upwards to the neck. Shoulder pain is present. Question of radiculopathy. When the arm is elevated and extended- this can be waking her up, so painful is the intense sensation. The most painful spot is at the carpal tunnel.          02-09-2020: RN notes-rm 10. presents for follow up visit. pt states that he has had a pinching sensation in back area which started 2 weeks ago and its intermittent. she has been seeing Dr Kieth Brightly and feels that has been helping. pt said she had some skin concerns for which she is seeing specialist. She developed a dark spot in her left eye saw her ophthalmologist, a laser procedure is planned for 10/4.   Ovarian Cancer diagnosed in 2009 ! She is a survivor- and made a remarkable recovery. By January 2020 she was happy and well, and during the pandemic she became  tearful , lonely, tearful. She was  depressed, clinically, tearful- needs cognitive  therapy and a listener, rather than a prescriber.  She has regained her composure and she is managing her life well, has benefited from cognitive behavior therapy.      1. 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  I have refilled ambien- I do not think it's worth weaning off, given her longstanding use of sleep aids. Insomnia has improved. She needs more energy, more focus. We discussed adderall. She accepted my offer. 10 mg XR.    2. Cognitive changes are stabile in patient with ovarian cancer and long term chemotherapy - MOCA now again 25-30 from 22-30 since  January 2017.    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  NP also with Dysautonomia, syncopal events, Has fallen in her house. She is aware of her surroundings, stunned but not confused, not postictal. Had a fall in 2016, in 2017, and 3 in 2018 - but none in 2020. NCV did not show anything 4 years ago.    Patient's neuropathy is progressive and dysautonomia is part of this PNP. She already had accu-puncture and cupping and dry needling.    She continues on Lyrica, now  200 mg bid and Duloxetine.- prescribed by Berniece Andreas, MD    CD 02-09-2020. I have the pleasure meeting again today again [name] ,  a multiracial femele patient, who was last seen by our nurse practitioner Amy Lomax.  The patient underwent neuropsychological testing and the last date of service had been 9-21 2021.  This was a follow-up with Dr. Arley Phenix whom she had seen a year earlier for baseline neuropsychological evaluation.  She has ongoing adjustment issues, cognitive changes following her treatment for cancer.  She had some residual cognitive changes that were attributed to chemotherapy effects.  She also has a neuropathy that is likely related to either the primary malignancy or the treatment thereof.  Dr. 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  The diagnosis of neurocognitive deficits had been made a year earlier. She has been reporting ongoing hypersomnia- Epworth sleepiness score 15/ 14 points, she has better sleep quality at night.  Bouts of insomnia but much less long and frequent.  She attributed this to Dr Elyn Aquas behavior therapy, improving also anxiety and depression. Depression manifesting I feeling overwhelmed, feeling paralyzed by the overwhelmed feeling. " get no- thing done    Review of Systems: Out of a complete 14 system review, the patient complains of only the following symptoms, and all other reviewed systems are negative.:  Depression, worried,  about memory,neuropathy.    How likely are you to doze in the following situations: 0 = not likely, 1 = slight chance, 2 = moderate chance, 3 = high chance   Sitting and Reading? Watching Television? Sitting inactive in a public place (theater or meeting)? As a passenger in a car for an hour without a break? Lying down in the afternoon when circumstances permit? Sitting and talking to someone? Sitting quietly after lunch without alcohol? In a car, while stopped for a few minutes in traffic?   Total = 15/ 24 points   FSS endorsed at na/ 63 points.  GDS 7/ 15 points.   Social History   Socioeconomic History   Marital status: Divorced    Spouse name: Not on file   Number of children: 1   Years of education: Master's   Highest education level: Not on file  Occupational History    Employer: UNEMPLOYED    Comment: disabled  Tobacco Use   Smoking status: Every Day    Current packs/day: 1.00    Average packs/day: 1 pack/day for 44.8 years (44.8 ttl pk-yrs)    Types: Cigarettes    Start date: 06/16/1978   Smokeless tobacco: Never   Tobacco comments:    2-  7-16  STILL SMOKING  Vaping Use   Vaping status: Never Used  Substance and Sexual Activity   Alcohol use: Yes    Alcohol/week: 0.0 standard drinks of alcohol    Comment: rare, 3 weekly   Drug use: No   Sexual activity: Not Currently  Other Topics Concern   Not on file  Social History Narrative   Divorced   1 son /1 adopted adult son  2 dogs    Sales occupation on disability for now   Regular exercise-yes money stress   Daily caffeine use 2-3 and 2 cups of coffee   On since a security disability since 2012   Patient is right-handed.   Patient has a Master's degree.   Social Determinants of  Health   Financial Resource Strain: Low Risk  (05/15/2022)   Overall Financial Resource Strain (CARDIA)    Difficulty of Paying Living Expenses: Not hard at all  Food Insecurity: No Food Insecurity (05/15/2022)   Hunger Vital Sign    Worried About Running Out of Food in the Last Year: Never true    Ran Out of Food in the Last Year: Never true  Transportation Needs: No Transportation Needs (05/15/2022)   PRAPARE - Administrator, Civil Service (Medical): No    Lack of Transportation (Non-Medical): No  Physical Activity: Sufficiently Active (05/15/2022)   Exercise Vital Sign    Days of Exercise per Week: 5 days    Minutes of Exercise per Session: 40 min  Stress: Stress Concern Present (05/15/2022)   [employer] of Occupational Health - Occupational Stress Questionnaire    Feeling of Stress : To some extent  Social Connections: Socially Isolated (05/15/2022)   Social Connection and Isolation Panel [NHANES]    Frequency of Communication with Friends and Family: More than three times a week    Frequency of Social Gatherings with Friends and Family: More than three times a week    Attends Religious Services: Never    Database administrator or Organizations: No    Attends Engineer, structural: Never    Marital Status: Divorced    Family History  Problem Relation Age of  Onset   Pulmonary embolism Mother    Hypertension Father    Breast cancer Maternal Grandmother        diagnosed in early 57s   Hyperlipidemia Other    Breast cancer Sister 20   Colon cancer Neg Hx    Esophageal cancer Neg Hx    Rectal cancer Neg Hx    Stomach cancer Neg Hx     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   Clotting disorder (HCC)    GERD (gastroesophageal reflux disease)    HLD (hyperlipidemia)    Hx pulmonary embolism    Hypertension    Iron deficiency anemia due to chronic blood loss 11/04/2016   Metrorrhagia    Neuromuscular disorder (HCC)    Neuropathy    Obesity    Ovarian cancer (HCC) 04/2008   ChemoTherapy   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   Raynaud's syndrome    Right groin mass    Squamous cell carcinoma of skin    Ulcerative colitis (HCC)    Vision abnormalities    Vitamin D deficiency     Past Surgical History:  Procedure Laterality Date   ABDOMINAL HYSTERECTOMY  05/2008   TAH/BSO   AUGMENTATION MAMMAPLASTY Bilateral 10+ years   BREAST BIOPSY Left 2020   BREAST BIOPSY Right    BREAST REDUCTION SURGERY  2000   CESAREAN SECTION     1991   COLONOSCOPY  2004; 12/07/10   hemorrhoids   ESOPHAGOGASTRODUODENOSCOPY     GASTRIC BYPASS  1979   IR CV LINE INJECTION  03/13/2022   IR IMAGING GUIDED PORT INSERTION  03/07/2023   REDUCTION MAMMAPLASTY Bilateral    TOENAIL EXCISION Bilateral    TONSILLECTOMY       Current Outpatient Medications  on File Prior to Visit  Medication Sig Dispense Refill   amLODipine (NORVASC) 10 MG tablet Take 1 tablet (10 mg total) by mouth daily. 90 tablet 1   amphetamine-dextroamphetamine (ADDERALL XR) 20 MG 24 hr capsule Take 1 capsule (20 mg total) by  mouth daily. 30 capsule 0   aspirin 81 MG tablet Take 81 mg by mouth daily. 2 TABS IN AM     butalbital-acetaminophen-caffeine (FIORICET) 50-325-40 MG tablet Take 1 tablet by mouth every 6 (six) hours as needed for headache (migraine abortion). TAKE 1 TABLET BY MOUTH EVERY 6 HOURS AS NEEDED FOR HEADACHE further refills should come from Neurology team . 10 tablet 5   calcium carbonate (TUMS - DOSED IN MG ELEMENTAL CALCIUM) 500 MG chewable tablet Chew 1 tablet by mouth daily as needed for indigestion or heartburn.     diclofenac (VOLTAREN) 75 MG EC tablet as needed.  1   donepezil (ARICEPT) 10 MG tablet TAKE 1 TABLET(10 MG) BY MOUTH EVERY MORNING 90 tablet 1   DULoxetine (CYMBALTA) 60 MG capsule TAKE 1 CAPSULE(60 MG) BY MOUTH DAILY 90 capsule 0   Multiple Vitamin (MULTIVITAMIN WITH MINERALS) TABS tablet Take 1 tablet by mouth daily.     orphenadrine (NORFLEX) 100 MG tablet TAKE ONE TABLET BY MOUTH TWICE DAILY AS NEEDED FOR MUSCLE SPASMS 180 tablet 0   pregabalin (LYRICA) 200 MG capsule Take 1 capsule (200 mg total) by mouth 2 (two) times daily. 60 capsule 0   rosuvastatin (CRESTOR) 5 MG tablet TAKE 1 TABLET(5 MG) BY MOUTH DAILY 90 tablet 3   spironolactone (ALDACTONE) 25 MG tablet TAKE 1 TABLET(25 MG) BY MOUTH DAILY 90 tablet 0   Vitamin D, Ergocalciferol, (DRISDOL) 1.25 MG (50000 UNIT) CAPS capsule TAKE 1 CAPSULE BY MOUTH 1 TIME A WEEK 13 capsule 2   zolpidem (AMBIEN) 5 MG tablet TAKE 1 TO 2 TABLETS(5 TO 10 MG) BY MOUTH EVERY NIGHT AT BEDTIME AS NEEDED 180 tablet 2   Current Facility-Administered Medications on File Prior to Visit  Medication Dose Route Frequency Provider Last Rate Last Admin   heparin lock flush 100 unit/mL  500 Units Intravenous Once Ennever, Rose Phi, MD       sodium chloride flush (NS) 0.9 % injection 10 mL  10 mL Intravenous PRN Ennever, Rose Phi, MD        Allergies  Allergen Reactions   Codeine Nausea Only   Orange Itching and Rash     DIAGNOSTIC DATA (LABS, IMAGING,  TESTING) - I reviewed patient records, labs, notes, testing and imaging myself where available.  Lab Results  Component Value Date   WBC 7.2 01/20/2023   HGB 14.1 01/20/2023   HCT 43.4 01/20/2023   MCV 89.1 01/20/2023   PLT 213 01/20/2023      Component Value Date/Time   NA 137 01/20/2023 0948   NA 147 (H) 04/16/2017 1152   NA 139 12/27/2015 1156   K 4.3 01/20/2023 0948   K 3.4 04/16/2017 1152   K 3.9 12/27/2015 1156   CL 104 01/20/2023 0948   CL 101 04/16/2017 1152   CO2 27 01/20/2023 0948   CO2 30 04/16/2017 1152   CO2 27 12/27/2015 1156   GLUCOSE 116 (H) 01/20/2023 0948   GLUCOSE 89 04/16/2017 1152   BUN 34 (H) 01/20/2023 0948   BUN 18 04/16/2017 1152   BUN 14.9 12/27/2015 1156   CREATININE 0.98 01/20/2023 0948   CREATININE 1.07 (H) 03/01/2020 1626   CREATININE 0.9 12/27/2015 1156  CALCIUM 9.7 01/20/2023 0948   CALCIUM 9.8 04/16/2017 1152   CALCIUM 9.7 12/27/2015 1156   PROT 6.7 01/20/2023 0948   PROT 6.5 04/16/2017 1152   PROT 6.8 12/27/2015 1156   ALBUMIN 4.2 01/20/2023 0948   ALBUMIN 3.6 04/16/2017 1152   ALBUMIN 4.0 07/08/2016 1432   ALBUMIN 3.5 12/27/2015 1156   AST 33 01/20/2023 0948   AST 29 12/27/2015 1156   ALT 38 01/20/2023 0948   ALT 26 04/16/2017 1152   ALT 28 12/27/2015 1156   ALKPHOS 108 01/20/2023 0948   ALKPHOS 87 (H) 04/16/2017 1152   ALKPHOS 106 12/27/2015 1156   BILITOT 0.2 (L) 01/20/2023 0948   BILITOT 0.33 12/27/2015 1156   GFRNONAA >60 01/20/2023 0948   GFRNONAA 57 (L) 03/01/2020 1626   GFRAA 66 03/01/2020 1626   Lab Results  Component Value Date   CHOL 183 11/20/2021   HDL 51.80 11/20/2021   LDLCALC 109 (H) 11/20/2021   LDLDIRECT 98.7 09/04/2009   TRIG 115.0 11/20/2021   CHOLHDL 4 11/20/2021   Lab Results  Component Value Date   HGBA1C 6.4 11/20/2021   Lab Results  Component Value Date   VITAMINB12 840 11/20/2021   Lab Results  Component Value Date   TSH 2.67 11/20/2021    PHYSICAL EXAM:  Today's Vitals    03/27/23 1416 03/27/23 1428  BP: (!) 141/100 (!) 142/90  Pulse: 89 75  Weight: 183 lb (83 kg)   Height: 5\' 7"  (1.702 m)    Body mass index is 28.66 kg/m.   Wt Readings from Last 3 Encounters:  03/27/23 183 lb (83 kg)  01/27/23 180 lb 4 oz (81.8 kg)  01/20/23 185 lb (83.9 kg)     Ht Readings from Last 3 Encounters:  03/27/23 5\' 7"  (1.702 m)  01/27/23 5\' 7"  (1.702 m)  01/20/23 5\' 7"  (1.702 m)      General: The patient is awake and alert, oriented to place and time.   Memory subjective described as impaired ;        07/16/2022    2:00 PM 11/27/2021    2:36 PM 05/29/2021    1:20 PM 02/09/2020   10:26 AM 11/25/2018    3:35 PM  Montreal Cognitive Assessment   Visuospatial/ Executive (0/5) 3 3 3 5 3   Naming (0/3) 3 3 3 3 3   Attention: Read list of digits (0/2) 2 0 1 1 0  Attention: Read list of letters (0/1) 1 1 1 1 1   Attention: Serial 7 subtraction starting at 100 (0/3) 3 3 3 2 3   Language: Repeat phrase (0/2) 2 1 2 2 1   Language : Fluency (0/1) 1 1 1 1 1   Abstraction (0/2) 2 2 2 2 2   Delayed Recall (0/5) 3 3 3 3 3   Orientation (0/6) 5 5 6 5 5   Total 25 22 25 25 22     Attention span & concentration ability appears normal.  Speech is fluent,  without  dysarthria, dysphonia or aphasia.  Mood and affect are appropriate.   Cranial nerve : No change in taste ,but complete loss of smell .ANOSMIA. for 8 years now.  She feels as if she smells, but  couldn't.  Pupils were equal round reactive to light.  Extraocular movements were full, visual field were full on confrontational test . Facial sensation and strength were normal. Uvula and tongue midline.  Bilateral tongue protrusion is strong.   Motor: dropping things, lack of grip strength , preserved lower extremities except  foot dorsiflexion.  symmetric motor tone is noted throughout.  Sensory:  Progressed, profound numbness in feet and calf. Left knee pain.  Arising from the toes through the forefoot to the heel. Pain arising from  the Forefoot.     Hand pain and Raynauds to the wrist, ascending to shoulder, not neck.  Had tension neck pain.  Gait and station: Tandem gait is abnormal.  She needs to control her steps - needs to look where she steps as she can't feel it-    Romberg is negative !  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 Refilled her [place].   2) Neuropathy, unchanged   3) neuro-cognition is unchanged.once a year MOCA would be fine.   Refilled adderall ,lyrica and ambien  I plan to follow up either personally or through our NP within 12 months.   I would like to thank Panosh, Neta Mends, MD and Fabian Sharp Neta Mends, Md [address] [place],  [place] [address] for allowing me to meet with and to take care of this pleasant patient.   CC: I will share my notes with PCP, Dr Kieth Brightly .  After spending a total time of  25  minutes face to face and additional time for physical and neurologic examination, review of laboratory studies,  personal review of imaging studies, reports and results of other testing and review of referral information / records as far as provided in visit,   Electronically signed by: Melvyn Novas, MD 03/27/2023 3:39 PM  [facility] Board certified by The [employer] of Sleep Medicine and Diplomate of the [employer] of Sleep Medicine. Board certified In Neurology through the ABPN, Fellow of the [employer] of Neu[employer].

## 2023-04-29 DIAGNOSIS — L438 Other lichen planus: Secondary | ICD-10-CM | POA: Diagnosis not present

## 2023-05-03 ENCOUNTER — Encounter: Payer: Self-pay | Admitting: Internal Medicine

## 2023-05-06 NOTE — Telephone Encounter (Signed)
Need appt   visit   to discuss  30 minutes  ( ?does  she meet criteria for glp1 under neuro and vascular?)

## 2023-05-12 NOTE — Progress Notes (Signed)
 Virtual Visit via Video Note  I connected with [name] on 05/13/23 at  8:30 AM EST by a video enabled telemedicine application and verified that I am speaking with the correct person using two identifiers. Location patient: home Location provider:work  office Persons participating in the virtual visit: patient, provider   Patient aware  of the limitations of evaluation and management by telemedicine and  availability of in person appointments. and agreed to proceed.   HPI: [name] presents for video visit  Last visit was 3 24  and discussed ozempic     Last dose was 1 mg given in sept  94   Had used compounded  form cause of cost    going to gym trying to help . Has been off and  on  mostly on but off for a month?  Lowest weight was in 165 range now creeping back up to 185 range  Also wanted to try  there is a program for mounjara  zepbound  direct  [facility] program that she may be able to afford    Denied se of meds of significance  ROS: See pertinent positives and negatives per HPI.  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   Clotting disorder (HCC)    GERD (gastroesophageal reflux disease)    HLD (hyperlipidemia)    Hx pulmonary embolism    Hypertension    Iron  deficiency anemia due to chronic blood loss 11/04/2016   Metrorrhagia    Neuromuscular disorder (HCC)    Neuropathy    Obesity    Ovarian cancer (HCC) 04/2008   ChemoTherapy   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   Raynaud's syndrome    Right groin mass    Squamous cell carcinoma of skin    Ulcerative colitis (HCC)    Vision abnormalities    Vitamin D  deficiency     Past Surgical History:  Procedure Laterality Date   ABDOMINAL  HYSTERECTOMY  05/2008   TAH/BSO   AUGMENTATION MAMMAPLASTY Bilateral 10+ years   BREAST BIOPSY Left 2020   BREAST BIOPSY Right    BREAST REDUCTION SURGERY  2000   CESAREAN SECTION     1991   COLONOSCOPY  2004; 12/07/10   hemorrhoids   ESOPHAGOGASTRODUODENOSCOPY     GASTRIC BYPASS  1979   IR CV LINE INJECTION  03/13/2022   IR IMAGING GUIDED PORT INSERTION  03/07/2023   REDUCTION MAMMAPLASTY Bilateral    TOENAIL EXCISION Bilateral    TONSILLECTOMY      Family History  Problem Relation Age of Onset   Pulmonary embolism Mother    Hypertension Father    Breast cancer Maternal Grandmother        diagnosed in early 110s   Hyperlipidemia Other    Breast cancer Sister 38   Colon cancer Neg Hx    Esophageal cancer Neg Hx    Rectal cancer Neg Hx    Stomach cancer Neg Hx     Social History   Tobacco Use   Smoking status: Every Day    Current packs/day: 1.00    Average packs/day: 1 pack/day for 44.9 years (44.9 ttl pk-yrs)    Types: Cigarettes  Start date: 06/16/1978   Smokeless tobacco: Never   Tobacco comments:    2- 7-16  STILL SMOKING  Vaping Use   Vaping status: Never Used  Substance Use Topics   Alcohol use: Yes    Alcohol/week: 0.0 standard drinks of alcohol    Comment: rare, 3 weekly   Drug use: No      Current Outpatient Medications:    amLODipine  (NORVASC ) 10 MG tablet, Take 1 tablet (10 mg total) by mouth daily., Disp: 90 tablet, Rfl: 1   amphetamine -dextroamphetamine  (ADDERALL XR) 20 MG 24 hr capsule, Take 1 capsule (20 mg total) by mouth daily., Disp: 90 capsule, Rfl: 0   aspirin  81 MG tablet, Take 81 mg by mouth daily. 2 TABS IN AM, Disp: , Rfl:    calcium  carbonate (TUMS - DOSED IN MG ELEMENTAL CALCIUM ) 500 MG chewable tablet, Chew 1 tablet by mouth daily as needed for indigestion or heartburn., Disp: , Rfl:    donepezil  (ARICEPT ) 10 MG tablet, TAKE 1 TABLET(10 MG) BY MOUTH EVERY MORNING, Disp: 90 tablet, Rfl: 3   DULoxetine  (CYMBALTA ) 60 MG capsule, Take 1  capsule (60 mg total) by mouth daily., Disp: 90 capsule, Rfl: 3   Multiple Vitamin (MULTIVITAMIN WITH MINERALS) TABS tablet, Take 1 tablet by mouth daily., Disp: , Rfl:    orphenadrine  (NORFLEX ) 100 MG tablet, TAKE ONE TABLET BY MOUTH TWICE DAILY AS NEEDED FOR MUSCLE SPASMS, Disp: 180 tablet, Rfl: 0   pregabalin  (LYRICA ) 200 MG capsule, Take 1 capsule (200 mg total) by mouth 2 (two) times daily., Disp: 180 capsule, Rfl: 1   rosuvastatin  (CRESTOR ) 5 MG tablet, TAKE 1 TABLET(5 MG) BY MOUTH DAILY, Disp: 90 tablet, Rfl: 3   spironolactone  (ALDACTONE ) 25 MG tablet, TAKE 1 TABLET(25 MG) BY MOUTH DAILY, Disp: 90 tablet, Rfl: 0   Vitamin D , Ergocalciferol , (DRISDOL ) 1.25 MG (50000 UNIT) CAPS capsule, TAKE 1 CAPSULE BY MOUTH 1 TIME A WEEK, Disp: 13 capsule, Rfl: 2   zolpidem  (AMBIEN ) 5 MG tablet, TAKE 1 TO 2 TABLETS(5 TO 10 MG) BY MOUTH EVERY NIGHT AT BEDTIME AS NEEDED, Disp: 180 tablet, Rfl: 2   butalbital -acetaminophen -caffeine  (FIORICET) 50-325-40 MG tablet, Take 1 tablet by mouth every 6 (six) hours as needed for headache (migraine abortion). TAKE 1 TABLET BY MOUTH EVERY 6 HOURS AS NEEDED FOR HEADACHE further refills should come from Neurology team . (Patient not taking: Reported on 05/13/2023), Disp: 10 tablet, Rfl: 5   diclofenac (VOLTAREN) 75 MG EC tablet, as needed. (Patient not taking: Reported on 05/13/2023), Disp: , Rfl: 1 No current facility-administered medications for this visit.  Facility-Administered Medications Ordered in Other Visits:    heparin  lock flush 100 unit/mL, 500 Units, Intravenous, Once, Ennever, Maude SAUNDERS, MD   sodium chloride  flush (NS) 0.9 % injection 10 mL, 10 mL, Intravenous, PRN, Ennever, Maude SAUNDERS, MD  EXAM: BP Readings from Last 3 Encounters:  03/27/23 (!) 142/90  03/07/23 125/78  02/03/23 (!) 144/90   Wt Readings from Last 3 Encounters:  05/13/23 185 lb (83.9 kg)  03/27/23 183 lb (83 kg)  01/27/23 180 lb 4 oz (81.8 kg)    VITALS per patient if  applicable:  GENERAL: alert, oriented, appears well and in no acute distress  HEENT: atraumatic, conjunttiva clear, no obvious abnormalities on inspection of external nose and ears  NECK: normal movements of the head and neck  LUNGS: on inspection no signs of respiratory distress, breathing rate appears normal, no obvious gross SOB, gasping or wheezing  CV:  no obvious cyanosis  MS: moves all visible extremities without noticeable abnormality  PSYCH/NEURO: pleasant and cooperative, no obvious depression or anxiety, speech and thought processing grossly intact Lab Results  Component Value Date   WBC 7.2 01/20/2023   HGB 14.1 01/20/2023   HCT 43.4 01/20/2023   PLT 213 01/20/2023   GLUCOSE 116 (H) 01/20/2023   CHOL 183 11/20/2021   TRIG 115.0 11/20/2021   HDL 51.80 11/20/2021   LDLDIRECT 98.7 09/04/2009   LDLCALC 109 (H) 11/20/2021   ALT 38 01/20/2023   AST 33 01/20/2023   NA 137 01/20/2023   K 4.3 01/20/2023   CL 104 01/20/2023   CREATININE 0.98 01/20/2023   BUN 34 (H) 01/20/2023   CO2 27 01/20/2023   TSH 2.67 11/20/2021   INR 0.86 06/02/2013   HGBA1C 6.4 11/20/2021    ASSESSMENT AND PLAN:  Discussed the following assessment and plan:    ICD-10-CM   1. Obesity (BMI 30-39.9)  E66.9     2. Medication management  Z79.899     3. Essential hypertension  I10     4. 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 Bariatric surgery status  Z98.84     6. Prediabetes  R73.03      I am ok prescribing zepbound  with observation . But begin at beginning dose since a different med make up. Apparently comes in a vial and not noted in EHR to order. Have her send us  any info she has for this program and I would approve  zepbound  2.5 mg per week for a month and then increase. To 5 mg per week if tolerated  Plan 3 mos fu after  this  and lab as indicated if needed  She will get back on track for exercise resistance training and [post holiday reset. Suspect bp will  improve based on past.  Counseled.   Message sent to  pharmacy team  to help  getting this plan implemented   Expectant management and discussion of plan and treatment with opportunity to ask questions and all were answered. The patient agreed with the plan and demonstrated an understanding of the instructions.   Advised to call back or seek an in-person evaluation if worsening  or having  further concerns  in interim. Return in about 3 months (around 08/11/2023).    Apolinar Eastern, MD

## 2023-05-13 ENCOUNTER — Telehealth (INDEPENDENT_AMBULATORY_CARE_PROVIDER_SITE_OTHER): Payer: Medicare Other | Admitting: Internal Medicine

## 2023-05-13 ENCOUNTER — Encounter: Payer: Self-pay | Admitting: Internal Medicine

## 2023-05-13 ENCOUNTER — Other Ambulatory Visit: Payer: Self-pay | Admitting: Neurology

## 2023-05-13 ENCOUNTER — Other Ambulatory Visit: Payer: Self-pay | Admitting: Family

## 2023-05-13 VITALS — Ht 67.0 in | Wt 185.0 lb

## 2023-05-13 DIAGNOSIS — Z79899 Other long term (current) drug therapy: Secondary | ICD-10-CM

## 2023-05-13 DIAGNOSIS — Z9884 Bariatric surgery status: Secondary | ICD-10-CM | POA: Diagnosis not present

## 2023-05-13 DIAGNOSIS — G622 Polyneuropathy due to other toxic agents: Secondary | ICD-10-CM | POA: Diagnosis not present

## 2023-05-13 DIAGNOSIS — E669 Obesity, unspecified: Secondary | ICD-10-CM

## 2023-05-13 DIAGNOSIS — I10 Essential (primary) hypertension: Secondary | ICD-10-CM

## 2023-05-13 DIAGNOSIS — R7303 Prediabetes: Secondary | ICD-10-CM

## 2023-05-16 ENCOUNTER — Other Ambulatory Visit (INDEPENDENT_AMBULATORY_CARE_PROVIDER_SITE_OTHER): Payer: Medicare Other | Admitting: Pharmacist

## 2023-05-16 DIAGNOSIS — E669 Obesity, unspecified: Secondary | ICD-10-CM

## 2023-05-16 MED ORDER — TIRZEPATIDE-WEIGHT MANAGEMENT 5 MG/0.5ML ~~LOC~~ SOLN: 5.0000 mg | SUBCUTANEOUS | 12 refills | Status: DC

## 2023-05-16 NOTE — Progress Notes (Signed)
   05/16/2023 Name: [name] MRN: [id number] DOB: 12-05-1960  Chief Complaint  Patient presents with   Medication Access   Received message from PCP requesting Zepbound  be sent to [facility] per the request of the patient.   She previously was on compounded semaglutide  and is switching to Zepbound  due to semaglutide  no longer helping with weight loss. Pt is aware of the cost and that the [facility] program will reach out to her once the Rx is received.   Darrelyn Drum, PharmD, BCPS, CPP Clinical Pharmacist Practitioner [facility] at [facility] [phone]

## 2023-05-17 ENCOUNTER — Encounter: Payer: Self-pay | Admitting: Neurology

## 2023-05-19 NOTE — Telephone Encounter (Signed)
 Spoke to patient  made her aware called pharmacy and zolpidem will be ready for pick later this afternoon Pt thanked me for calling

## 2023-05-20 ENCOUNTER — Other Ambulatory Visit: Payer: Self-pay | Admitting: Family

## 2023-05-20 ENCOUNTER — Telehealth: Payer: Self-pay

## 2023-05-20 MED ORDER — TIRZEPATIDE-WEIGHT MANAGEMENT 2.5 MG/0.5ML ~~LOC~~ SOLN: 2.5000 mg | SUBCUTANEOUS | 2 refills | Status: DC

## 2023-05-20 NOTE — Telephone Encounter (Signed)
 Attempted to reach pt. Left detail message about change in zepbound and for pt to call us back.

## 2023-05-20 NOTE — Telephone Encounter (Signed)
 Copied from CRM [id number]. Topic: Clinical - Prescription Issue >> May 20, 2023 11:28 AM Joanell NOVAK wrote: Reason for CRM: [name] called from [facility] and is requesting an ICD code or will need a new prescription for medication Zepbound . Callback number would [phone]

## 2023-05-20 NOTE — Addendum Note (Signed)
 Addended by: Vickii Chafe on: 05/20/2023 11:54 AM   Modules accepted: Orders

## 2023-05-20 NOTE — Telephone Encounter (Signed)
 Contacted pharmacy and spoke to [place].   Inform her we sent in error for 5mg  and 2.5mg  suppose to be send in.  She reports she had already paid for 5mg  on Friday and has not shipped it. She states she will find out information about refund and to hold. Inform stephanie, I'm about about to grab a pt and will contact her back.   She advise to contact provider line [phone]. By the time I call, they will have update information about refund.
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
## 2023-05-21 ENCOUNTER — Ambulatory Visit: Payer: Medicare Other

## 2023-05-21 VITALS — Ht 67.0 in | Wt 191.0 lb

## 2023-05-21 DIAGNOSIS — Z0000 Encounter for general adult medical examination without abnormal findings: Secondary | ICD-10-CM

## 2023-05-21 NOTE — Progress Notes (Signed)
 Subjective:   [name] is a 63 y.o. female who presents for Medicare Annual (Subsequent) preventive examination.  Visit Complete: Virtual I connected with  [name] on 05/21/23 by a audio enabled telemedicine application and verified that I am speaking with the correct person using two identifiers.  Patient Location: Home  Provider Location: Home Office  I discussed the limitations of evaluation and management by telemedicine. The patient expressed understanding and agreed to proceed.  Vital Signs: Because this visit was a virtual/telehealth visit, some criteria may be missing or patient reported. Any vitals not documented were not able to be obtained and vitals that have been documented are patient reported.   Cardiac Risk Factors include: advanced age (>44men, >77 women);hypertension     Objective:    Today's Vitals   05/21/23 1425  Weight: 191 lb (86.6 kg)  Height: 5' 7 (1.702 m)   Body mass index is 29.91 kg/m.     05/21/2023    2:37 PM 02/03/2023   12:16 PM 01/20/2023   10:43 AM 08/09/2022   12:03 PM 05/15/2022    2:13 PM 03/22/2022    1:07 PM 02/21/2022    1:07 PM  Advanced Directives  Does Patient Have a Medical Advance Directive? Yes No No Yes Yes Yes Yes  Type of Estate Agent of [place];Living will   Living will;Healthcare Power of [employer] Power of [place];Living will    Does patient want to make changes to medical advance directive?  No - Patient declined No - Patient declined No - Patient declined  No - Patient declined No - Patient declined  Copy of Healthcare Power of Attorney in Chart? No - copy requested    No - copy requested    Would patient like information on creating a medical advance directive?   No - Patient declined        Current Medications (verified) Outpatient Encounter Medications as of 05/21/2023  Medication Sig   amLODipine  (NORVASC ) 10 MG tablet Take 1 tablet (10 mg total) by mouth daily.    amphetamine -dextroamphetamine  (ADDERALL XR) 20 MG 24 hr capsule Take 1 capsule (20 mg total) by mouth daily.   aspirin  81 MG tablet Take 81 mg by mouth daily. 2 TABS IN AM   butalbital -acetaminophen -caffeine  (FIORICET) 50-325-40 MG tablet Take 1 tablet by mouth every 6 (six) hours as needed for headache (migraine abortion). TAKE 1 TABLET BY MOUTH EVERY 6 HOURS AS NEEDED FOR HEADACHE further refills should come from Neurology team . (Patient not taking: Reported on 05/13/2023)   calcium  carbonate (TUMS - DOSED IN MG ELEMENTAL CALCIUM ) 500 MG chewable tablet Chew 1 tablet by mouth daily as needed for indigestion or heartburn.   diclofenac (VOLTAREN) 75 MG EC tablet as needed. (Patient not taking: Reported on 05/13/2023)   donepezil  (ARICEPT ) 10 MG tablet TAKE 1 TABLET(10 MG) BY MOUTH EVERY MORNING   DULoxetine  (CYMBALTA ) 60 MG capsule Take 1 capsule (60 mg total) by mouth daily.   Multiple Vitamin (MULTIVITAMIN WITH MINERALS) TABS tablet Take 1 tablet by mouth daily.   orphenadrine  (NORFLEX ) 100 MG tablet TAKE ONE TABLET BY MOUTH TWICE DAILY AS NEEDED FOR MUSCLE SPASMS   pregabalin  (LYRICA ) 200 MG capsule Take 1 capsule (200 mg total) by mouth 2 (two) times daily.   rosuvastatin  (CRESTOR ) 5 MG tablet TAKE 1 TABLET(5 MG) BY MOUTH DAILY   spironolactone  (ALDACTONE ) 25 MG tablet TAKE 1 TABLET(25 MG) BY MOUTH DAILY   tirzepatide  (ZEPBOUND ) 2.5 MG/0.5ML injection  vial Inject 2.5 mg into the skin once a week.   tirzepatide  5 MG/0.5ML injection vial Inject 5 mg into the skin once a week.   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   Clotting disorder (HCC)    GERD (gastroesophageal reflux disease)    HLD (hyperlipidemia)    Hx pulmonary embolism    Hypertension    Iron  deficiency anemia due to chronic blood loss 11/04/2016   Metrorrhagia    Neuromuscular disorder (HCC)    Neuropathy    Obesity    Ovarian cancer (HCC) 04/2008   ChemoTherapy   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   Raynaud's syndrome    Right groin mass    Squamous cell carcinoma of skin    Ulcerative colitis (HCC)    Vision abnormalities    Vitamin D  deficiency    Past Surgical History:  Procedure Laterality Date   ABDOMINAL HYSTERECTOMY  05/2008   TAH/BSO   AUGMENTATION MAMMAPLASTY Bilateral 10+ years   BREAST BIOPSY Left 2020   BREAST BIOPSY Right    BREAST REDUCTION SURGERY  2000   CESAREAN SECTION     1991   COLONOSCOPY  2004; 12/07/10   hemorrhoids   ESOPHAGOGASTRODUODENOSCOPY     GASTRIC BYPASS  1979   IR CV LINE INJECTION  03/13/2022   IR IMAGING GUIDED PORT INSERTION  03/07/2023   REDUCTION MAMMAPLASTY Bilateral    TOENAIL EXCISION Bilateral    TONSILLECTOMY     Family History  Problem Relation Age of Onset   Pulmonary embolism Mother    Hypertension Father    Breast cancer Maternal Grandmother        diagnosed in early 41s   Hyperlipidemia Other    Breast cancer Sister 46   Colon cancer Neg Hx    Esophageal cancer Neg Hx    Rectal cancer Neg Hx    Stomach cancer Neg Hx    Social History   Socioeconomic History   Marital status: Divorced    Spouse name: Not on file   Number of children: 1   Years of education: Master's   Highest education level: Not on file  Occupational History    Employer:  UNEMPLOYED    Comment: disabled  Tobacco Use   Smoking status: Every Day    Current packs/day: 1.00    Average packs/day: 1 pack/day for 44.9 years (44.9 ttl pk-yrs)    Types: Cigarettes    Start date: 06/16/1978   Smokeless tobacco: Never   Tobacco comments:    2- 7-16  STILL SMOKING  Vaping Use   Vaping status: Never Used  Substance and Sexual Activity   Alcohol use: Yes    Alcohol/week: 0.0 standard drinks  of alcohol    Comment: rare, 3 weekly   Drug use: No   Sexual activity: Not Currently  Other Topics Concern   Not on file  Social History Narrative   Divorced   1 son /1 adopted adult son  2 dogs    Sales occupation on disability for now   Regular exercise-yes money stress   Daily caffeine  use 2-3 and 2 cups of coffee   On since a security disability since 2012   Patient is right-handed.   Patient has a Master's degree.   Social Drivers of Corporate Investment Banker Strain: Low Risk  (05/21/2023)   Overall Financial Resource Strain (CARDIA)    Difficulty of Paying Living Expenses: Not hard at all  Food Insecurity: No Food Insecurity (05/21/2023)   Hunger Vital Sign    Worried About Running Out of Food in the Last Year: Never true    Ran Out of Food in the Last Year: Never true  Transportation Needs: No Transportation Needs (05/21/2023)   PRAPARE - Administrator, Civil Service (Medical): No    Lack of Transportation (Non-Medical): No  Physical Activity: Sufficiently Active (05/21/2023)   Exercise Vital Sign    Days of Exercise per Week: 3 days    Minutes of Exercise per Session: 60 min  Stress: Stress Concern Present (05/21/2023)   [employer] of Occupational Health - Occupational Stress Questionnaire    Feeling of Stress : To some extent  Social Connections: Socially Isolated (05/21/2023)   Social Connection and Isolation Panel [NHANES]    Frequency of Communication with Friends and Family: More than three times a week    Frequency of Social Gatherings  with Friends and Family: More than three times a week    Attends Religious Services: Never    Database Administrator or Organizations: No    Attends Engineer, Structural: Never    Marital Status: Divorced    Tobacco Counseling Ready to quit: No Counseling given: Yes Tobacco comments: 2- 7-16  STILL SMOKING   Clinical Intake:  Pre-visit preparation completed: Yes  Pain : No/denies pain     BMI - recorded: 29.91 Nutritional Status: BMI 25 -29 Overweight Nutritional Risks: None Diabetes: No  How often do you need to have someone help you when you read instructions, pamphlets, or other written materials from your doctor or pharmacy?: 1 - Never  Interpreter Needed?: No  Information entered by :: Rojelio Blush LPN   Activities of Daily Living    05/21/2023    2:35 PM  In your present state of health, do you have any difficulty performing the following activities:  Hearing? 0  Vision? 0  Difficulty concentrating or making decisions? 0  Walking or climbing stairs? 0  Dressing or bathing? 0  Doing errands, shopping? 0  Preparing Food and eating ? N  Using the Toilet? N  In the past six months, have you accidently leaked urine? N  Do you have problems with loss of bowel control? N  Managing your Medications? N  Managing your Finances? N  Housekeeping or managing your Housekeeping? N    Patient Care Team: Panosh, Apolinar POUR, MD as PCP - General (Internal Medicine) Avram Lupita BRAVO, MD (Gastroenterology) Lenon Rudolph BRAVO, PsyD (Inactive) (Psychiatry) Timmy Maude SAUNDERS, MD as Attending Physician (Internal Medicine) Dohmeier, Dedra, MD (Neurology) Cesario Boer, MD as Attending Physician (Physical Medicine and Rehabilitation) Al Marcus GAILS, [facility] (Inactive) as Pharmacist (Pharmacist)  Indicate any recent  Medical Services you may have received from other than [facility] providers in the past year (date may be approximate).     Assessment:   This is a routine wellness  examination for Prudie.  Hearing/Vision screen Hearing Screening - Comments:: Denies hearing difficulties   Vision Screening - Comments:: Wears rx glasses - up to date with routine eye exams with  [facility]   Goals Addressed               This Visit's Progress     Increase physical activity (pt-stated)         Depression Screen    05/21/2023    2:33 PM 05/13/2023    8:27 AM 07/10/2022    3:57 PM 05/15/2022    2:07 PM 05/15/2022    2:06 PM 12/31/2021    3:04 PM 11/20/2021   11:36 AM  PHQ 2/9 Scores  PHQ - 2 Score 1 2 2 2  0  2  PHQ- 9 Score 3 4 8 2   11   Exception Documentation --           Information is confidential and restricted. Go to Review Flowsheets to unlock data.    Fall Risk    05/21/2023    2:36 PM 07/10/2022    3:57 PM 05/15/2022    2:10 PM 05/14/2022    1:22 PM 11/20/2021   11:36 AM  Fall Risk   Falls in the past year? 1 0 1 1 1   Number falls in past yr: 0 0 0 1 0  Injury with Fall? 0 0 1 0 1  Comment   Injury to rt arm. Followed by medical attention    Risk for fall due to : No Fall Risks No Fall Risks Impaired balance/gait  No Fall Risks  Follow up Falls prevention discussed Falls evaluation completed Falls prevention discussed  Falls prevention discussed    MEDICARE RISK AT HOME: Medicare Risk at Home Any stairs in or around the home?: No If so, are there any without handrails?: No Home free of loose throw rugs in walkways, pet beds, electrical cords, etc?: Yes Adequate lighting in your home to reduce risk of falls?: Yes Life alert?: No Use of a cane, walker or w/c?: No Grab bars in the bathroom?: No Shower chair or bench in shower?: Yes Elevated toilet seat or a handicapped toilet?: No  TIMED UP AND GO:  Was the test performed?  No    Cognitive Function:      03/27/2023    2:20 PM 07/16/2022    2:00 PM 11/27/2021    2:36 PM 05/29/2021    1:20 PM 02/09/2020   10:26 AM  Montreal Cognitive Assessment   Visuospatial/ Executive (0/5) 5 4 3 3 5    Naming (0/3) 3 3 3 3 3   Attention: Read list of digits (0/2) 1 2 0 1 1  Attention: Read list of letters (0/1) 1 1 1 1 1   Attention: Serial 7 subtraction starting at 100 (0/3) 3 2 3 3 2   Language: Repeat phrase (0/2) 2 2 1 2 2   Language : Fluency (0/1) 1 1 1 1 1   Abstraction (0/2) 2 2 2 2 2   Delayed Recall (0/5) 3 4 3 3 3   Orientation (0/6) 5 5 5 6 5   Total 26 26 22 25 25       05/21/2023    2:37 PM 05/15/2022    2:14 PM 05/10/2021    2:24 PM  6CIT Screen  What  Year? 0 points 0 points 0 points  What month? 0 points 0 points 0 points  What time? 0 points 0 points 0 points  Count back from 20 0 points 0 points 2 points  Months in reverse 0 points 0 points 0 points  Repeat phrase 0 points 0 points 2 points  Total Score 0 points 0 points 4 points    Immunizations Immunization History  Administered Date(s) Administered   Hep A / Hep B 06/16/2018, 06/16/2018, 06/16/2018, 08/09/2018, 08/09/2018, 02/27/2019   Influenza Split 03/02/2014   Influenza Whole 03/05/2007, 02/11/2012   Influenza,inj,Quad PF,6+ Mos 03/24/2015, 01/08/2018, 02/08/2019   Influenza-Unspecified 03/10/2017, 05/14/2023   Moderna Covid-19 Vaccine Bivalent Booster 13yrs & up 04/09/2021   Moderna Sars-Covid-2 Vaccination 06/25/2019, 07/23/2019, 02/04/2020   Pneumococcal Conjugate-13 11/02/2013   Td 12/12/2002   Tdap 11/02/2013, 01/16/2020   Zoster, Live 08/11/2012    TDAP status: Up to date  Flu Vaccine status: Up to date    Covid-19 vaccine status: Declined, Education has been provided regarding the importance of this vaccine but patient still declined. Advised may receive this vaccine at local pharmacy or Health Dept.or vaccine clinic. Aware to provide a copy of the vaccination record if obtained from local pharmacy or Health Dept. Verbalized acceptance and understanding.  Qualifies for Shingles Vaccine? Yes   Zostavax completed No   Shingrix Completed?: No.    Education has been provided regarding the  importance of this vaccine. Patient has been advised to call insurance company to determine out of pocket expense if they have not yet received this vaccine. Advised may also receive vaccine at local pharmacy or Health Dept. Verbalized acceptance and understanding.  Screening Tests Health Maintenance  Topic Date Due   Zoster Vaccines- Shingrix (1 of 2) 08/14/1979   COVID-19 Vaccine (5 - 2024-25 season) 01/12/2023   Lung Cancer Screening  02/03/2024   Medicare Annual Wellness (AWV)  05/20/2024   MAMMOGRAM  11/27/2024   DTaP/Tdap/Td (4 - Td or Tdap) 01/15/2030   Colonoscopy  03/20/2030   INFLUENZA VACCINE  Completed   Hepatitis C Screening  Completed   HIV Screening  Completed   HPV VACCINES  Aged Out    Health Maintenance  Health Maintenance Due  Topic Date Due   Zoster Vaccines- Shingrix (1 of 2) 08/14/1979   COVID-19 Vaccine (5 - 2024-25 season) 01/12/2023    Colorectal cancer screening: Type of screening: Colonoscopy. Completed 03/20/20. Repeat every 10 years  Mammogram status: Completed 11/28/22. Repeat every year    Lung Cancer Screening: (Low Dose CT Chest recommended if Age 65-80 years, 20 pack-year currently smoking OR have quit w/in 15years.) does qualify.    Additional Screening:  Hepatitis C Screening: does qualify; Completed 05/30/17  Vision Screening: Recommended annual ophthalmology exams for early detection of glaucoma and other disorders of the eye. Is the patient up to date with their annual eye exam?  Yes  Who is the provider or what is the name of the office in which the patient attends annual eye exams? [facility] If pt is not established with a provider, would they like to be referred to a provider to establish care? No .   Dental Screening: Recommended annual dental exams for proper oral hygiene    Community Resource Referral / Chronic Care Management:  CRR required this visit?  No   CCM required this visit?  No     Plan:     I have  personally reviewed and noted the following in  the patient's chart:   Medical and social history Use of alcohol, tobacco or illicit drugs  Current medications and supplements including opioid prescriptions. Patient is not currently taking opioid prescriptions. Functional ability and status Nutritional status Physical activity Advanced directives List of other physicians Hospitalizations, surgeries, and ER visits in previous 12 months Vitals Screenings to include cognitive, depression, and falls Referrals and appointments  In addition, I have reviewed and discussed with patient certain preventive protocols, quality metrics, and best practice recommendations. A written personalized care plan for preventive services as well as general preventive health recommendations were provided to patient.     Rojelio LELON Blush, LPN   12/11/7972   After Visit Summary: (MyChart) Due to this being a telephonic visit, the after visit summary with patients personalized plan was offered to patient via MyChart   Nurse Notes: None

## 2023-05-21 NOTE — Patient Instructions (Addendum)
 Ms. Dubas , Thank you for taking time to come for your Medicare Wellness Visit. I appreciate your ongoing commitment to your health goals. Please review the following plan we discussed and let me know if I can assist you in the future.   Referrals/Orders/Follow-Ups/Clinician Recommendations:   This is a list of the screening recommended for you and due dates:  Health Maintenance  Topic Date Due   Zoster (Shingles) Vaccine (1 of 2) 08/14/1979   COVID-19 Vaccine (5 - 2024-25 season) 01/12/2023   Screening for Lung Cancer  02/03/2024   Medicare Annual Wellness Visit  05/20/2024   Mammogram  11/27/2024   DTaP/Tdap/Td vaccine (4 - Td or Tdap) 01/15/2030   Colon Cancer Screening  03/20/2030   Flu Shot  Completed   Hepatitis C Screening  Completed   HIV Screening  Completed   HPV Vaccine  Aged Out    Advanced directives: (Copy Requested) Please bring a copy of your health care power of attorney and living will to the office to be added to your chart at your convenience.  Next Medicare Annual Wellness Visit scheduled for next year: Yes  insert Preventive Care Attachment Reference

## 2023-05-21 NOTE — Telephone Encounter (Signed)
 Icd code was given to pharmacist. See other encounter.

## 2023-05-28 ENCOUNTER — Other Ambulatory Visit: Payer: Self-pay | Admitting: Internal Medicine

## 2023-06-02 ENCOUNTER — Other Ambulatory Visit: Payer: Medicare Other

## 2023-06-02 DIAGNOSIS — E669 Obesity, unspecified: Secondary | ICD-10-CM

## 2023-06-02 DIAGNOSIS — Z79899 Other long term (current) drug therapy: Secondary | ICD-10-CM

## 2023-06-02 MED ORDER — TIRZEPATIDE-WEIGHT MANAGEMENT 5 MG/0.5ML ~~LOC~~ SOLN: 5.0000 mg | SUBCUTANEOUS | 2 refills | Status: DC

## 2023-06-02 NOTE — Progress Notes (Signed)
   06/02/2023  Patient ID: [name], female   DOB: 12-Mar-1961, 63 y.o.   MRN: [id number]  Reached out to patient via telephone to follow up on Zepbound vials at request of PCP.  Patient confirms she has received Zepbound 2.5mg  and has been taking for 2 weeks. Denies any side effects or issues taking medication.  Is requesting a new rx for the 5mg  dose be sent to company at this time so there are no delays when time to give herself the higher dose (After completing four doses of 2.5mg  x 1 month).  Made patient aware that 5mg  is currently the highest dose offered by the [employer] program and dose would not be able to be increased if she were to remain in the program. Patient expressed understanding.   Follow Up: Instructed patient to contact us if any issues obtaining or taking medication. Provided my direct line.  Sherrill Raring, PharmD Clinical Pharmacist [phone]

## 2023-06-16 ENCOUNTER — Encounter: Payer: Medicare Other | Attending: Psychology | Admitting: Psychology

## 2023-06-16 DIAGNOSIS — R29818 Other symptoms and signs involving the nervous system: Secondary | ICD-10-CM | POA: Diagnosis present

## 2023-06-16 DIAGNOSIS — F331 Major depressive disorder, recurrent, moderate: Secondary | ICD-10-CM | POA: Diagnosis present

## 2023-06-16 DIAGNOSIS — G479 Sleep disorder, unspecified: Secondary | ICD-10-CM | POA: Insufficient documentation

## 2023-06-16 DIAGNOSIS — R5383 Other fatigue: Secondary | ICD-10-CM | POA: Insufficient documentation

## 2023-06-16 DIAGNOSIS — F4310 Post-traumatic stress disorder, unspecified: Secondary | ICD-10-CM | POA: Insufficient documentation

## 2023-06-16 DIAGNOSIS — R4189 Other symptoms and signs involving cognitive functions and awareness: Secondary | ICD-10-CM | POA: Insufficient documentation

## 2023-06-20 ENCOUNTER — Encounter: Payer: Self-pay | Admitting: Hematology & Oncology

## 2023-06-25 ENCOUNTER — Other Ambulatory Visit: Payer: Self-pay | Admitting: Internal Medicine

## 2023-07-01 ENCOUNTER — Other Ambulatory Visit: Payer: Self-pay

## 2023-07-01 ENCOUNTER — Ambulatory Visit: Payer: Self-pay | Admitting: Internal Medicine

## 2023-07-01 ENCOUNTER — Other Ambulatory Visit: Payer: Self-pay | Admitting: Family

## 2023-07-01 ENCOUNTER — Emergency Department (HOSPITAL_BASED_OUTPATIENT_CLINIC_OR_DEPARTMENT_OTHER): Payer: Medicare Other

## 2023-07-01 ENCOUNTER — Ambulatory Visit: Payer: Self-pay | Admitting: *Deleted

## 2023-07-01 ENCOUNTER — Emergency Department (HOSPITAL_BASED_OUTPATIENT_CLINIC_OR_DEPARTMENT_OTHER)
Admission: EM | Admit: 2023-07-01 | Discharge: 2023-07-01 | Disposition: A | Discharge status: Home / Self Care | Payer: Medicare Other | Attending: Emergency Medicine | Admitting: Emergency Medicine

## 2023-07-01 ENCOUNTER — Inpatient Hospital Stay: Payer: Medicare Other | Attending: Hematology & Oncology

## 2023-07-01 ENCOUNTER — Encounter (HOSPITAL_BASED_OUTPATIENT_CLINIC_OR_DEPARTMENT_OTHER): Payer: Self-pay | Admitting: Emergency Medicine

## 2023-07-01 DIAGNOSIS — Z79899 Other long term (current) drug therapy: Secondary | ICD-10-CM | POA: Insufficient documentation

## 2023-07-01 DIAGNOSIS — Z8543 Personal history of malignant neoplasm of ovary: Secondary | ICD-10-CM | POA: Diagnosis not present

## 2023-07-01 DIAGNOSIS — W268XXA Contact with other sharp object(s), not elsewhere classified, initial encounter: Secondary | ICD-10-CM | POA: Diagnosis not present

## 2023-07-01 DIAGNOSIS — S61011A Laceration without foreign body of right thumb without damage to nail, initial encounter: Secondary | ICD-10-CM | POA: Insufficient documentation

## 2023-07-01 DIAGNOSIS — I10 Essential (primary) hypertension: Secondary | ICD-10-CM | POA: Diagnosis not present

## 2023-07-01 DIAGNOSIS — Z7982 Long term (current) use of aspirin: Secondary | ICD-10-CM | POA: Diagnosis not present

## 2023-07-01 DIAGNOSIS — M7989 Other specified soft tissue disorders: Secondary | ICD-10-CM | POA: Diagnosis not present

## 2023-07-01 DIAGNOSIS — F17200 Nicotine dependence, unspecified, uncomplicated: Secondary | ICD-10-CM | POA: Insufficient documentation

## 2023-07-01 DIAGNOSIS — S6991XA Unspecified injury of right wrist, hand and finger(s), initial encounter: Secondary | ICD-10-CM | POA: Diagnosis present

## 2023-07-01 DIAGNOSIS — M79644 Pain in right finger(s): Secondary | ICD-10-CM | POA: Diagnosis not present

## 2023-07-01 MED ORDER — CEPHALEXIN 500 MG PO CAPS: 500.0000 mg | ORAL_CAPSULE | Freq: Four times a day (QID) | ORAL | 0 refills | Status: DC

## 2023-07-01 MED ORDER — HEPARIN SOD (PORK) LOCK FLUSH 100 UNIT/ML IV SOLN
500.0000 [IU] | Freq: Once | INTRAVENOUS | Status: AC
  Administered 2023-07-01: 500 [IU]
  Filled 2023-07-01: qty 5

## 2023-07-01 NOTE — Discharge Instructions (Addendum)
You were seen today for laceration to right thumb after a metal brush cut it.  Due to concerns for underlying infection, you are prescribed Keflex which you can take this 4 times a day.  Take this with food to avoid stomach upset.  If symptoms persist despite antibiotics, or develop new symptoms, return to the ED but have also provided information for [facility] which you could also go to for persistent symptoms.

## 2023-07-01 NOTE — Telephone Encounter (Signed)
Reason for Disposition  [1] Looks infected (spreading redness, red streak) AND [2] no fever  Answer Assessment - Initial Assessment Questions 1. LOCATION: "Where is the wound located?"      Port flush is scheduled today- patient cut her finger last week 2. WOUND APPEARANCE: "What does the wound look like?"      Not healing- red, draining, thumb-R  5. ONSET: "When did it start to look infected?"      Over 1 week 6. MECHANISM: "How did the wound start, what was the cause?"     Cut on metal brush 7. PAIN: Do you have any pain?"  If Yes, ask: "How bad is the pain?"  (e.g., Scale 1-10; mild, moderate, or severe)    - MILD (1-3): Doesn't interfere with normal activities.     - MODERATE (4-7): Interferes with normal activities or awakens from sleep.    - SEVERE (8-10): Excruciating pain, unable to do any normal activities.       throbbing 8. FEVER: "Do you have a fever?" If Yes, ask: "What is your temperature, how was it measured, and when did it start?"     no  Protocols used: Wound Infection Suspected-A-AH

## 2023-07-01 NOTE — Telephone Encounter (Signed)
Copied from CRM [id number]. Topic: Appointments - Appointment Scheduling >> Jul 01, 2023 10:51 AM [name] wrote: Patient/patient representative is calling to schedule an appointment. Refer to attachments for appointment information.  Patient cut her finger open and do not if medal is stuck in her finger   Chief Complaint: Laceration of right thumb Symptoms: redness, pain, drainage Frequency: 1 week ago Pertinent Negatives: Patient denies any other symptoms Disposition: [] ED /[x] Urgent Care (no appt availability in office) / [] Appointment(In office/virtual)/ []  [place] [facility]/ [] Home Care/ [] Refused Recommended Disposition /[] [place] [facility]/ []  Follow-up with PCP Additional Notes: Patient called and advised that her right thumb is getting worse from a cut a week ago. She was cleaning last week with a metal brush.  One of the bristles cut her right thumb. She said that the cut is wet and bigger. She is unsure if a metal piece of the bristle is still in her finger. Patient said that last night she woke up with this right thumb throbbing.  Patient is advised Patient advised January 18, 2020 was her last tetanus shot. Patient was advised that if this happened a week ago and looks worse and she is unsure if anything is still stuck in the finger like a metal piece of the bristle---she is advised that she should have it assessed soon. It is recommended with a possibly infected area within 24 hours and no availability in patient's office within the next 24 hours. Patient advised that with the weather coming and her having an appointment at 2pm today to have her port flushed, she is going to go to an urgent care to get them to take a look at her finger and possibly get an xray done to make sure there is nothing metal left in her finger.  Reason for Disposition  [1] After 3 days AND [2] pain not improved  [1] Looks infected (spreading redness, pus) AND [2] no fever  Answer Assessment -  Initial Assessment Questions 1. MECHANISM: "How did the injury happen?"      Cut with metal cleaning brush bristle 2. ONSET: "When did the injury happen?" (Minutes or hours ago)      About a week ago 3. LOCATION: "What part of the finger is injured?" "Is the nail damaged?"      Right thumb  4. APPEARANCE of the INJURY: "What does the injury look like?"      Red purple inside and draining some and skin separating--not actively bleeding 5. SEVERITY: "Can you use the hand normally?"  "Can you bend your fingers into a ball and then fully open them?"     Not able to use it well 6. SIZE: For cuts, bruises, or swelling, ask: "How large is it?" (e.g., inches or centimeters;  entire finger)      About half an inch 7. PAIN: "Is there pain?" If Yes, ask: "How bad is the pain?"    (e.g., Scale 1-10; or mild, moderate, severe)  - NONE (0): no pain.  - MILD (1-3): doesn't interfere with normal activities.   - MODERATE (4-7): interferes with normal activities or awakens from sleep.  - SEVERE (8-10): excruciating pain, unable to hold a glass of water or bend finger even a little.     3 but if she tries to bend the thumb a 7 8. TETANUS: For any breaks in the skin, ask: "When was the last tetanus booster?"     Unsure--maybe about 5 years 9. OTHER SYMPTOMS: "Do you  have any other symptoms?"     no  Answer Assessment - Initial Assessment Questions 1. APPEARANCE of INJURY: "What does the injury look like?"      Red/open/draining 1/2 inch cut 2. SIZE: "How large is the cut?"      1/2 inch approximately 3. BLEEDING: "Is it bleeding now?" If Yes, ask: "Is it difficult to stop?"      No bleeding 4. LOCATION: "Where is the injury located?"      Right thumb 5. ONSET: "How long ago did the injury occur?"      A week ago 6. MECHANISM: "Tell me how it happened."      Cleaning with a metal bristle brush 7. TETANUS: "When was the last tetanus booster?"     [place]18/[place]  Protocols used: Finger  Injury-A-AH, Cuts and Lacerations-A-AH

## 2023-07-01 NOTE — ED Provider Notes (Signed)
[place] EMERGENCY DEPARTMENT AT [facility] Provider Note   CSN: [id number] Arrival date & time: 07/01/23  1251     History  Chief Complaint  Patient presents with   Finger Injury    [name] is a 63 y.o. female.  HPI Patient is a 63 year old female presents to the ED today complaining of right thumb laceration, swelling, tenderness that initially happened 8 days ago but gotten progressively worse since yesterday.  Previous medical history of ovarian cancer, PE, Raynaud's syndrome, tobacco use, neuropathy due to chemotherapy.  Last Tdap 01/2020.  Denies fever, chills, decreased ROM, weakness, numbness, tingling.    Home Medications Prior to Admission medications   Medication Sig Start Date End Date Taking? Authorizing Provider  cephALEXin (KEFLEX) 500 MG capsule Take 1 capsule (500 mg total) by mouth 4 (four) times daily. 07/01/23  Yes Lunette Stands, PA-C  amLODipine (NORVASC) 10 MG tablet Take 1 tablet (10 mg total) by mouth daily. 03/05/23   Panosh, Neta Mends, MD  amphetamine-dextroamphetamine (ADDERALL XR) 20 MG 24 hr capsule Take 1 capsule (20 mg total) by mouth daily. 03/27/23   Dohmeier, Porfirio Mylar, MD  aspirin 81 MG tablet Take 81 mg by mouth daily. 2 TABS IN AM    [provider]  butalbital-acetaminophen-caffeine (FIORICET) 50-325-40 MG tablet Take 1 tablet by mouth every 6 (six) hours as needed for headache (migraine abortion). TAKE 1 TABLET BY MOUTH EVERY 6 HOURS AS NEEDED FOR HEADACHE further refills should come from Neurology team . Patient not taking: Reported on 05/13/2023 11/22/20   Dohmeier, Porfirio Mylar, MD  calcium carbonate (TUMS - DOSED IN MG ELEMENTAL CALCIUM) 500 MG chewable tablet Chew 1 tablet by mouth daily as needed for indigestion or heartburn.    [provider]  donepezil (ARICEPT) 10 MG tablet TAKE 1 TABLET(10 MG) BY MOUTH EVERY MORNING 03/27/23   Dohmeier, Porfirio Mylar, MD  DULoxetine (CYMBALTA) 60 MG capsule Take 1 capsule (60  mg total) by mouth daily. 03/27/23   Dohmeier, Porfirio Mylar, MD  Multiple Vitamin (MULTIVITAMIN WITH MINERALS) TABS tablet Take 1 tablet by mouth daily.    [provider]  orphenadrine (NORFLEX) 100 MG tablet TAKE ONE TABLET BY MOUTH TWICE DAILY AS NEEDED FOR MUSCLE SPASMS 05/28/23   Worthy Rancher B, FNP  pregabalin (LYRICA) 200 MG capsule Take 1 capsule (200 mg total) by mouth 2 (two) times daily. 03/27/23   Dohmeier, Porfirio Mylar, MD  rosuvastatin (CRESTOR) 5 MG tablet TAKE 1 TABLET(5 MG) BY MOUTH DAILY 11/18/22   Panosh, Neta Mends, MD  spironolactone (ALDACTONE) 25 MG tablet TAKE 1 TABLET(25 MG) BY MOUTH DAILY 06/25/23   Worthy Rancher B, FNP  tirzepatide [facility]) 2.5 MG/0.5ML injection vial Inject 2.5 mg into the skin once a week. 05/20/23   Panosh, Neta Mends, MD  tirzepatide 5 MG/0.5ML injection vial Inject 5 mg into the skin once a week. 06/02/23   Panosh, Neta Mends, MD  Vitamin D, Ergocalciferol, (DRISDOL) 1.25 MG (50000 UNIT) CAPS capsule TAKE 1 CAPSULE BY MOUTH 1 TIME A WEEK 03/19/23   Ennever, Rose Phi, MD  zolpidem (AMBIEN) 5 MG tablet TAKE 1 TO 2 TABLETS(5 TO 10 MG) BY MOUTH EVERY NIGHT AT BEDTIME AS NEEDED 05/15/23   Dohmeier, Porfirio Mylar, MD      Allergies    Codeine and Orange    Review of Systems   Review of Systems  Musculoskeletal:  Positive for myalgias.  All other systems reviewed and are negative.   Physical Exam Updated Vital Signs BP Marland Kitchen)  129/91 (BP Location: Right Arm)   Pulse 71   Temp 98.5 F (36.9 C)   Resp 15   SpO2 100%  Physical Exam Vitals and nursing note reviewed.  Constitutional:      Appearance: Normal appearance.  HENT:     Head: Normocephalic and atraumatic.  Eyes:     Extraocular Movements: Extraocular movements intact.     Conjunctiva/sclera: Conjunctivae normal.  Cardiovascular:     Rate and Rhythm: Normal rate and regular rhythm.     Pulses: Normal pulses.     Heart sounds: Normal heart sounds. No murmur heard.    No friction rub. No gallop.  Pulmonary:      Effort: Pulmonary effort is normal. No respiratory distress.     Breath sounds: Normal breath sounds.  Abdominal:     General: Abdomen is flat.     Palpations: Abdomen is soft.     Tenderness: There is no abdominal tenderness.  Musculoskeletal:        General: Tenderness (Tenderness noted over laceration and belly of right thumb.) and signs of injury present. No swelling.  Skin:    General: Skin is warm and dry.     Coloration: Skin is not jaundiced or pale.     Findings: No erythema.  Neurological:     General: No focal deficit present.     Mental Status: She is alert and oriented to person, place, and time. Mental status is at baseline.  Psychiatric:        Mood and Affect: Mood normal.        ED Results / Procedures / Treatments   Labs (all labs ordered are listed, but only abnormal results are displayed) Labs Reviewed - No data to display  EKG None  Radiology No results found.  Procedures Procedures    Medications Ordered in ED Medications - No data to display  ED Course/ Medical Decision Making/ A&P                                 Medical Decision Making Amount and/or Complexity of Data Reviewed Radiology: ordered.   This patient is a 63 year old female who presents to the ED for concern of right thumb laceration, swelling, warmth that started yesterday after receiving a cut 8 days ago while using a metal brush to clean grout.   Differential diagnoses prior to evaluation: The emergent differential diagnosis includes, but is not limited to, cellulitis, felon, paronychia, herpetic whitlow, flexor tenosynovitis, cyst. This is not an exhaustive differential.   Past Medical History / Co-morbidities / Social History: Raynaud's syndrome, tobacco use, HTN, PE, theatric surgery, urinary incontinence, neurocognitive deficits, leg neuralgia, Port-A-Cath  Additional history: Chart reviewed. Pertinent results include:  Scheduled for port flush today however had to  delay due to concern for finger.  Lab Tests/Imaging studies: I personally interpreted labs/imaging and the pertinent results include:   X-ray of right thumb unremarkable.  I agree with the radiologist interpretation.  Medications: I ordered medication including outpatient Keflex.  I have reviewed the patients home medicines and have made adjustments as needed.  ED Course:   Patient is a 63 year old female presents the ED complaining of a laceration to her right thumb that happened 8 days ago but within the last day become more painful and subjectively swollen.  Previous medical history of ovarian cancer, PE, Raynaud's.  Denies fever, erythema.  Had been using topical antibiotics.  Physical exam  is notable for tenderness noted to belly of right thumb as well as laceration noted to the lateral aspect of right thumb.  No edema noted when compared to left thumb.  Thumb also is nonerythematous at this time.  Full range of motion, sensation, and strength present.   Laceration not repairable at this time due to length of time since injury. X-rays were unremarkable.  No foreign body noted.  Patient wound was cleaned and rebandaged.  Will send home with Keflex, no previous medical history of MRSA.  Low suspicion for felon, paronychia, fracture, foreign body at this time.  Patient vitals have been stable at the course of her time here.  I believe patient safe to discharge at this time.   Disposition: After consideration of the diagnostic results and the patients response to treatment, I feel that patient would benefit from discharge and treatment noted as above.   emergency department workup does not suggest an emergent condition requiring admission or immediate intervention beyond what has been performed at this time. The plan is: Keflex, follow-up with Ortho if continued symptoms, return to ED if new or worsening symptoms present. The patient is safe for discharge and has been instructed to return  immediately for worsening symptoms, change in symptoms or any other concerns.  Final Clinical Impression(s) / ED Diagnoses Final diagnoses:  Laceration of right thumb without foreign body without damage to nail, initial encounter    Rx / DC Orders ED Discharge Orders          Ordered    cephALEXin (KEFLEX) 500 MG capsule  4 times daily        07/01/23 1512              Lunette Stands, [place] 07/01/23 1642    Charlynne Pander, MD 07/01/23 2308

## 2023-07-01 NOTE — Telephone Encounter (Signed)
Advice worker Complaint: cut- possible infection Symptoms: cut on thumb- painful and red Frequency: over 1 week ago Pertinent Negatives: Patient denies fever Disposition: [] ED /[] Urgent Care (no appt availability in office) / [] Appointment(In office/virtual)/ []  [place] [facility]/ [] Home Care/ [] Refused Recommended Disposition /[] [place] [facility]/ [x]  Follow-up with PCP Additional Notes: Patient advised to contact PCP- if can not be seen today- UC is option. Patient wanted to be seen before her port flush today so if she needs to be x-rayed she can do that.

## 2023-07-01 NOTE — Progress Notes (Signed)
Right chest PAC accessed and flushed. Patient is located in the [facility] ED. Primary RN notified that port is flushed. Requested to leave accessed in case patient needs any IV medication. Primary RN to de-access PAC prior to patient's discharge from the ED.

## 2023-07-01 NOTE — ED Triage Notes (Signed)
Cut finger while using metal scrub brush. Happened 06/23/2023 Getting worse, increased pain and swelling Right thumb  Tetanus UTD

## 2023-07-02 ENCOUNTER — Ambulatory Visit: Payer: Medicare Other

## 2023-07-14 ENCOUNTER — Encounter: Payer: Self-pay | Admitting: Psychology

## 2023-07-14 ENCOUNTER — Encounter: Payer: Medicare Other | Attending: Psychology | Admitting: Psychology

## 2023-07-14 DIAGNOSIS — G479 Sleep disorder, unspecified: Secondary | ICD-10-CM | POA: Diagnosis present

## 2023-07-14 DIAGNOSIS — R5383 Other fatigue: Secondary | ICD-10-CM | POA: Insufficient documentation

## 2023-07-14 DIAGNOSIS — F331 Major depressive disorder, recurrent, moderate: Secondary | ICD-10-CM | POA: Diagnosis not present

## 2023-07-14 DIAGNOSIS — R4189 Other symptoms and signs involving cognitive functions and awareness: Secondary | ICD-10-CM | POA: Diagnosis present

## 2023-07-14 DIAGNOSIS — R29818 Other symptoms and signs involving the nervous system: Secondary | ICD-10-CM | POA: Diagnosis not present

## 2023-07-14 DIAGNOSIS — F4310 Post-traumatic stress disorder, unspecified: Secondary | ICD-10-CM | POA: Diagnosis not present
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
## 2023-07-22 DIAGNOSIS — H43813 Vitreous degeneration, bilateral: Secondary | ICD-10-CM | POA: Diagnosis not present

## 2023-07-31 DIAGNOSIS — Z08 Encounter for follow-up examination after completed treatment for malignant neoplasm: Secondary | ICD-10-CM | POA: Diagnosis not present

## 2023-07-31 DIAGNOSIS — Z85828 Personal history of other malignant neoplasm of skin: Secondary | ICD-10-CM | POA: Diagnosis not present

## 2023-07-31 DIAGNOSIS — D2372 Other benign neoplasm of skin of left lower limb, including hip: Secondary | ICD-10-CM | POA: Diagnosis not present

## 2023-07-31 DIAGNOSIS — D235 Other benign neoplasm of skin of trunk: Secondary | ICD-10-CM | POA: Diagnosis not present

## 2023-07-31 DIAGNOSIS — L814 Other melanin hyperpigmentation: Secondary | ICD-10-CM | POA: Diagnosis not present

## 2023-07-31 DIAGNOSIS — L438 Other lichen planus: Secondary | ICD-10-CM | POA: Diagnosis not present

## 2023-07-31 DIAGNOSIS — L821 Other seborrheic keratosis: Secondary | ICD-10-CM | POA: Diagnosis not present

## 2023-07-31 DIAGNOSIS — L905 Scar conditions and fibrosis of skin: Secondary | ICD-10-CM | POA: Diagnosis not present

## 2023-08-04 ENCOUNTER — Other Ambulatory Visit: Payer: Self-pay | Admitting: Family

## 2023-08-11 ENCOUNTER — Other Ambulatory Visit: Payer: Self-pay | Admitting: Internal Medicine

## 2023-08-13 ENCOUNTER — Telehealth: Payer: Self-pay | Admitting: Neurology

## 2023-08-13 MED ORDER — AMPHETAMINE-DEXTROAMPHET ER 20 MG PO CP24: 20.0000 mg | ORAL_CAPSULE | Freq: Every day | ORAL | 0 refills | Status: DC

## 2023-08-13 NOTE — Telephone Encounter (Signed)
 Pt is needing a refill request for her amphetamine-dextroamphetamine (ADDERALL XR) 20 MG 24 hr capsule  sent to her pharmacy

## 2023-08-13 NOTE — Telephone Encounter (Signed)
 Meds ordered this encounter  Medications   amphetamine-dextroamphetamine (ADDERALL XR) 20 MG 24 hr capsule    Sig: Take 1 capsule (20 mg total) by mouth daily.    Dispense:  30 capsule    Refill:  0   [name] Marker, MD 08/13/2023, 3:12 PM Certified in Neurology, Neurophysiology and Neuroimaging  [facility] [address], [address] [place], [place] [address] [phone]

## 2023-08-13 NOTE — Progress Notes (Signed)
 This encounter was created in error - please disregard.

## 2023-08-16 ENCOUNTER — Other Ambulatory Visit: Payer: Self-pay | Admitting: Internal Medicine

## 2023-08-18 NOTE — Telephone Encounter (Signed)
 Pt called stating that she is still needing the refill on her amphetamine-dextroamphetamine (ADDERALL XR) 20 MG 24 hr capsule sent to the [facility] on [address]

## 2023-08-19 ENCOUNTER — Ambulatory Visit (INDEPENDENT_AMBULATORY_CARE_PROVIDER_SITE_OTHER): Payer: Medicare Other | Admitting: Internal Medicine

## 2023-08-19 ENCOUNTER — Encounter: Payer: Self-pay | Admitting: Internal Medicine

## 2023-08-19 VITALS — BP 110/70 | HR 93 | Temp 97.7°F | Ht 66.99 in | Wt 179.8 lb

## 2023-08-19 DIAGNOSIS — E611 Iron deficiency: Secondary | ICD-10-CM

## 2023-08-19 DIAGNOSIS — Z0000 Encounter for general adult medical examination without abnormal findings: Secondary | ICD-10-CM | POA: Diagnosis not present

## 2023-08-19 DIAGNOSIS — G622 Polyneuropathy due to other toxic agents: Secondary | ICD-10-CM

## 2023-08-19 DIAGNOSIS — R7303 Prediabetes: Secondary | ICD-10-CM

## 2023-08-19 DIAGNOSIS — Z79899 Other long term (current) drug therapy: Secondary | ICD-10-CM | POA: Diagnosis not present

## 2023-08-19 DIAGNOSIS — Z9884 Bariatric surgery status: Secondary | ICD-10-CM

## 2023-08-19 DIAGNOSIS — E669 Obesity, unspecified: Secondary | ICD-10-CM

## 2023-08-19 DIAGNOSIS — I10 Essential (primary) hypertension: Secondary | ICD-10-CM | POA: Diagnosis not present

## 2023-08-19 DIAGNOSIS — F17200 Nicotine dependence, unspecified, uncomplicated: Secondary | ICD-10-CM | POA: Diagnosis not present

## 2023-08-19 MED ORDER — ORPHENADRINE CITRATE ER 100 MG PO TB12: ORAL_TABLET | ORAL | 0 refills | Status: DC

## 2023-08-19 MED ORDER — TIRZEPATIDE-WEIGHT MANAGEMENT 7.5 MG/0.5ML ~~LOC~~ SOLN: 7.5000 mg | SUBCUTANEOUS | 2 refills | Status: DC

## 2023-08-19 MED ORDER — ORPHENADRINE CITRATE ER 100 MG PO TB12
ORAL_TABLET | ORAL | 0 refills | Status: DC
Start: 2023-08-19 — End: 2023-08-19

## 2023-08-19 NOTE — Progress Notes (Signed)
 Chief Complaint  Patient presents with   Annual Exam    Pt reports she had coffee with creamer this morning. Pt would a refill Rx for muscle spasm and discuss increasing weight med injection.     HPI: Patient  [name]  63 y.o. comes in today for Preventive Health Care visit   Asks about increasing zepbound   from 5 mg  after a few months   on a lilliy program no sig se  some dec bms uses otc with this  BP  has been controlled  HLD: med Sleep :  has meds but  Sick cat wheezing causing  sleep issues.  So sleep deprived cause of that   thinks mood is ok  just tired from  taking care of pet.  Exercising and has trainer  to keep muscle mass up. Less falling  since lost weight and exercising Still using tobacco . No bleeding  but asks for iron level   has past hx of iffusions  but better  recently    Health Maintenance  Topic Date Due   Zoster Vaccines- Shingrix (1 of 2) 02/18/2024 (Originally 08/14/1979)   COVID-19 Vaccine (8 - Moderna risk 2024-25 season) 11/14/2023   INFLUENZA VACCINE  12/12/2023   Lung Cancer Screening  02/03/2024   Medicare Annual Wellness (AWV)  05/20/2024   MAMMOGRAM  11/27/2024   DTaP/Tdap/Td (4 - Td or Tdap) 01/15/2030   Colonoscopy  03/20/2030   Pneumococcal Vaccine 59-75 Years old  Completed   Hepatitis C Screening  Completed   HIV Screening  Completed   HPV VACCINES  Aged Out   Health Maintenance Review   ROS:  GEN/ HEENT: No fever, significant weight changes sweats headaches vision problems hearing changes, CV/ PULM; No chest pain shortness of breath cough, syncope,edema  change in exercise tolerance. GI /GU: No adominal pain, vomiting, change in bowel habits. No blood in the stool. No significant GU symptoms. SKIN/HEME: ,no acute skin rashes suspicious lesions or bleeding. No lymphadenopathy, nodules, masses.  NEURO/ PSYCH:  No  new neurologic signs same neuropathy sx . No  aggravated depression anxiety. IMM/ Allergy: No unusual  infections.  Allergy .   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   Clotting disorder (HCC)    GERD (gastroesophageal reflux disease)    HLD (hyperlipidemia)    Hx pulmonary embolism    Hypertension    Iron deficiency anemia due to chronic blood loss 11/04/2016   Metrorrhagia    Neuromuscular disorder (HCC)    Neuropathy    Obesity    Ovarian cancer (HCC) 04/2008   ChemoTherapy   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   Raynaud's syndrome    Right groin mass    Squamous cell carcinoma of skin    Ulcerative colitis (HCC)    Vision abnormalities    Vitamin D deficiency     Past Surgical History:  Procedure Laterality Date   ABDOMINAL HYSTERECTOMY  05/2008   TAH/BSO   AUGMENTATION MAMMAPLASTY Bilateral 10+ years   BREAST BIOPSY Left 2020   BREAST BIOPSY Right    BREAST REDUCTION  SURGERY  2000   CESAREAN SECTION     1991   COLONOSCOPY  2004; 12/07/10   hemorrhoids   ESOPHAGOGASTRODUODENOSCOPY     GASTRIC BYPASS  1979   IR CV LINE INJECTION  03/13/2022   IR IMAGING GUIDED PORT INSERTION  03/07/2023   REDUCTION MAMMAPLASTY Bilateral    TOENAIL EXCISION Bilateral    TONSILLECTOMY      Family History  Problem Relation Age of Onset   Pulmonary embolism Mother    Hypertension Father    Breast cancer Maternal Grandmother        diagnosed in early 70s   Hyperlipidemia Other    Breast cancer Sister 65   Colon cancer Neg Hx    Esophageal cancer Neg Hx    Rectal cancer Neg Hx    Stomach cancer Neg Hx     Social History   Socioeconomic History   Marital status: Divorced    Spouse name: Not on file   Number of children: 1   Years of education: Master's    Highest education level: Not on file  Occupational History    Employer: UNEMPLOYED    Comment: disabled  Tobacco Use   Smoking status: Every Day    Current packs/day: 1.00    Average packs/day: 1 pack/day for 45.2 years (45.2 ttl pk-yrs)    Types: Cigarettes    Start date: 06/16/1978   Smokeless tobacco: Never   Tobacco comments:    2- 7-16  STILL SMOKING  Vaping Use   Vaping status: Never Used  Substance and Sexual Activity   Alcohol use: Yes    Alcohol/week: 0.0 standard drinks of alcohol    Comment: rare, 3 weekly   Drug use: No   Sexual activity: Not Currently  Other Topics Concern   Not on file  Social History Narrative   Divorced   1 son /1 adopted adult son  2 dogs    Sales occupation on disability for now   Regular exercise-yes money stress   Daily caffeine use 2-3 and 2 cups of coffee   On since a security disability since 2012   Patient is right-handed.   Patient has a Master's degree.   Social Drivers of Corporate investment banker Strain: Low Risk  (05/21/2023)   Overall Financial Resource Strain (CARDIA)    Difficulty of Paying Living Expenses: Not hard at all  Food Insecurity: No Food Insecurity (05/21/2023)   Hunger Vital Sign    Worried About Running Out of Food in the Last Year: Never true    Ran Out of Food in the Last Year: Never true  Transportation Needs: No Transportation Needs (05/21/2023)   PRAPARE - Administrator, Civil Service (Medical): No    Lack of Transportation (Non-Medical): No  Physical Activity: Sufficiently Active (05/21/2023)   Exercise Vital Sign    Days of Exercise per Week: 3 days    Minutes of Exercise per Session: 60 min  Stress: Stress Concern Present (05/21/2023)   [employer] of Occupational Health - Occupational Stress Questionnaire    Feeling of Stress : To some extent  Social Connections: Socially Isolated (05/21/2023)   Social Connection and Isolation Panel [NHANES]    Frequency of Communication with Friends  and Family: More than three times a week    Frequency of Social Gatherings with Friends and Family: More than three times a week    Attends Religious Services: Never    Database administrator or Organizations:  No    Attends Banker Meetings: Never    Marital Status: Divorced    Outpatient Medications Prior to Visit  Medication Sig Dispense Refill   amLODipine (NORVASC) 10 MG tablet TAKE 1 TABLET(10 MG) BY MOUTH DAILY 90 tablet 1   amphetamine-dextroamphetamine (ADDERALL XR) 20 MG 24 hr capsule Take 1 capsule (20 mg total) by mouth daily. 30 capsule 0   aspirin 81 MG tablet Take 81 mg by mouth daily. 2 TABS IN AM     calcium carbonate (TUMS - DOSED IN MG ELEMENTAL CALCIUM) 500 MG chewable tablet Chew 1 tablet by mouth daily as needed for indigestion or heartburn.     donepezil (ARICEPT) 10 MG tablet TAKE 1 TABLET(10 MG) BY MOUTH EVERY MORNING 90 tablet 3   DULoxetine (CYMBALTA) 60 MG capsule Take 1 capsule (60 mg total) by mouth daily. 90 capsule 3   Multiple Vitamin (MULTIVITAMIN WITH MINERALS) TABS tablet Take 1 tablet by mouth daily.     pregabalin (LYRICA) 200 MG capsule Take 1 capsule (200 mg total) by mouth 2 (two) times daily. 180 capsule 1   rosuvastatin (CRESTOR) 5 MG tablet TAKE 1 TABLET(5 MG) BY MOUTH DAILY 90 tablet 3   spironolactone (ALDACTONE) 25 MG tablet TAKE 1 TABLET(25 MG) BY MOUTH DAILY 90 tablet 0   Vitamin D, Ergocalciferol, (DRISDOL) 1.25 MG (50000 UNIT) CAPS capsule TAKE 1 CAPSULE BY MOUTH 1 TIME A WEEK 13 capsule 2   ZEPBOUND 5 MG/0.5ML injection vial INJECT 0.5 ML (5 MG) UNDER THE SKIN ONCE WEEKLY (0.5ML= 50 UNITS) 2 mL 2   zolpidem (AMBIEN) 5 MG tablet TAKE 1 TO 2 TABLETS(5 TO 10 MG) BY MOUTH EVERY NIGHT AT BEDTIME AS NEEDED 180 tablet 2   orphenadrine (NORFLEX) 100 MG tablet TAKE ONE TABLET BY MOUTH TWICE DAILY AS NEEDED FOR MUSCLE SPASMS 180 tablet 0   butalbital-acetaminophen-caffeine (FIORICET) 50-325-40 MG tablet Take 1 tablet by mouth every 6 (six)  hours as needed for headache (migraine abortion). TAKE 1 TABLET BY MOUTH EVERY 6 HOURS AS NEEDED FOR HEADACHE further refills should come from Neurology team . (Patient not taking: Reported on 08/19/2023) 10 tablet 5   cephALEXin (KEFLEX) 500 MG capsule Take 1 capsule (500 mg total) by mouth 4 (four) times daily. 20 capsule 0   tirzepatide (ZEPBOUND) 2.5 MG/0.5ML injection vial Inject 2.5 mg into the skin once a week. (Patient not taking: Reported on 08/19/2023) 2 mL 2   Facility-Administered Medications Prior to Visit  Medication Dose Route Frequency Provider Last Rate Last Admin   heparin lock flush 100 unit/mL  500 Units Intravenous Once Ennever, Rose Phi, MD       sodium chloride flush (NS) 0.9 % injection 10 mL  10 mL Intravenous PRN Ennever, Rose Phi, MD         EXAM:  BP 110/70 (BP Location: Left Arm, Patient Position: Sitting, Cuff Size: Large)   Pulse 93   Temp 97.7 F (36.5 C) (Oral)   Ht 5' 6.99" (1.702 m)   Wt 179 lb 12.8 oz (81.6 kg)   SpO2 98%   BMI 28.17 kg/m   Body mass index is 28.17 kg/m. Wt Readings from Last 3 Encounters:  08/19/23 179 lb 12.8 oz (81.6 kg)  05/21/23 191 lb (86.6 kg)  05/13/23 185 lb (83.9 kg)    Physical Exam: Vital signs reviewed ZOX:WRUE is a well-developed well-nourished alert cooperative    who appearsr stated age in no acute distress.  HEENT: normocephalic atraumatic ,  Eyes: PERRL EOM's full, conjunctiva clear, Nares: paten,t no deformity discharge or tenderness., Ears: no deformity EAC's clear TMs with normal landmarks. Mouth: clear OP, no lesions, edema.  Moist mucous membranes. Dentition in adequate repair. NECK: supple without masses, thyromegaly or bruits. CHEST/PULM:  Clear to auscultation and percussion breath sounds equal no wheeze , rales or rhonchi.port site chest noted Breast: normal by inspection . No dimpling, discharge, masses, tenderness or discharge .old surgical scars  CV: PMI is nondisplaced, S1 S2 no gallops, murmurs, rubs.  Peripheral pulses are full without delay.No JVD .  ABDOMEN: Bowel sounds normal nontender  No guard or rebound, no hepato splenomegal no CVA tenderness. Extremtities:  No clubbing cyanosis or edema, no acute joint swelling or redness no focal atrophy NEURO:  Oriented x3, cranial nerves 3-12 appear to be intact, no obvious focal weakness,gait within normal limits no abnormal reflexes or asymmetrical sense not tested  SKIN: No acute rashes normal turgor, color, no bruising or petechiae. PSYCH: Oriented, good eye contact, no obvious depression anxiety, cognition and judgment appear normal. LN: no cervical axillary iadenopathy  Lab Results  Component Value Date   WBC 7.2 01/20/2023   HGB 14.1 01/20/2023   HCT 43.4 01/20/2023   PLT 213 01/20/2023   GLUCOSE 116 (H) 01/20/2023   CHOL 183 11/20/2021   TRIG 115.0 11/20/2021   HDL 51.80 11/20/2021   LDLDIRECT 98.7 09/04/2009   LDLCALC 109 (H) 11/20/2021   ALT 38 01/20/2023   AST 33 01/20/2023   NA 137 01/20/2023   K 4.3 01/20/2023   CL 104 01/20/2023   CREATININE 0.98 01/20/2023   BUN 34 (H) 01/20/2023   CO2 27 01/20/2023   TSH 2.67 11/20/2021   INR 0.86 06/02/2013   HGBA1C 6.4 11/20/2021    BP Readings from Last 3 Encounters:  08/19/23 110/70  07/01/23 (!) 129/91  03/27/23 (!) 142/90    Lab plan  reviewed with patient   ASSESSMENT AND PLAN:  Discussed the following assessment and plan:    ICD-10-CM   1. Visit for preventive health examination  Z00.00     2. Essential hypertension  I10 CBC with Differential/Platelet    Comprehensive metabolic panel with GFR    Lipid panel    TSH    Hemoglobin A1c    Microalbumin / creatinine urine ratio    orphenadrine (NORFLEX) 100 MG tablet    DISCONTINUED: orphenadrine (NORFLEX) 100 MG tablet    3. Obesity (BMI 30-39.9)  E66.9 CBC with Differential/Platelet    Comprehensive metabolic panel with GFR    Lipid panel    TSH    Hemoglobin A1c    Microalbumin / creatinine urine ratio     orphenadrine (NORFLEX) 100 MG tablet    DISCONTINUED: orphenadrine (NORFLEX) 100 MG tablet    4. 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 Bariatric surgery status  Z98.84 CBC with Differential/Platelet    Comprehensive metabolic panel with GFR    Lipid panel    TSH    Hemoglobin A1c    Microalbumin / creatinine urine ratio    orphenadrine (NORFLEX) 100 MG tablet    DISCONTINUED: orphenadrine (NORFLEX) 100 MG tablet    7. Prediabetes  R73.03 CBC with Differential/Platelet    Comprehensive metabolic panel with GFR    Lipid panel    TSH    Hemoglobin A1c    Microalbumin / creatinine urine ratio    orphenadrine (NORFLEX) 100 MG tablet    DISCONTINUED: orphenadrine (NORFLEX) 100 MG tablet    8. Tobacco use disorder  F17.200     9. Iron deficiency  E61.1 Iron, TIBC and Ferritin Panel    Update lab  Check iron levels  Ok to inc zepbound to 7.5 weekly if  can afford . BP is much better controlled  Caution with muscle relaxant pays out of pocket  as works better than other options tried . Sleep /cns precautions with meds   at this point has some irreg sleep cause of her pets medical problems  If all ok then recheck in about 3 mos  virtual ok  if appropriate Return in about 3 months (around 11/18/2023) for depending on results.  Patient Care Team: Estefan Pattison, Neta Mends, MD as PCP - General (Internal Medicine) Iva Boop, MD  (Gastroenterology) Vidal Schwalbe, PsyD (Inactive) (Psychiatry) Josph Macho, MD as Attending Physician (Internal Medicine) Dohmeier, Porfirio Mylar, MD (Neurology) Claria Dice, MD as Attending Physician (Physical Medicine and Rehabilitation) Judd Lien [place] Ropes, [facility] (Inactive) as Pharmacist (Pharmacist) Sherrill Raring, [facility] (Pharmacist) Patient Instructions  Good to see  you today . Ok to increase .   Zepbound to 7.5  weekly  Stop tobacco. Continue  muscle exercises .     Neta Mends. Lynda Capistran M.D.

## 2023-08-19 NOTE — Patient Instructions (Addendum)
 Good to see  you today . Ok to increase .   Zepbound to 7.5  weekly  Stop tobacco. Continue  muscle exercises .

## 2023-08-20 ENCOUNTER — Encounter: Payer: Self-pay | Admitting: Internal Medicine

## 2023-08-20 LAB — COMPREHENSIVE METABOLIC PANEL WITH GFR
ALT: 39 U/L — ABNORMAL HIGH (ref 0–35)
AST: 33 U/L (ref 0–37)
Albumin: 4.4 g/dL (ref 3.5–5.2)
Alkaline Phosphatase: 93 U/L (ref 39–117)
BUN: 24 mg/dL — ABNORMAL HIGH (ref 6–23)
CO2: 27 meq/L (ref 19–32)
Calcium: 10 mg/dL (ref 8.4–10.5)
Chloride: 102 meq/L (ref 96–112)
Creatinine, Ser: 0.88 mg/dL (ref 0.40–1.20)
GFR: 70.14 mL/min (ref >60.00)
Glucose, Bld: 69 mg/dL — ABNORMAL LOW (ref 70–99)
Potassium: 4.1 meq/L (ref 3.5–5.1)
Sodium: 137 meq/L (ref 135–145)
Total Bilirubin: 0.4 mg/dL (ref 0.2–1.2)
Total Protein: 7.1 g/dL (ref 6.0–8.3)

## 2023-08-20 LAB — MICROALBUMIN / CREATININE URINE RATIO
Creatinine,U: 155.1 mg/dL
Microalb Creat Ratio: 7.3 mg/g (ref 0.0–30.0)
Microalb, Ur: 1.1 mg/dL (ref 0.0–1.9)

## 2023-08-20 LAB — CBC WITH DIFFERENTIAL/PLATELET
Basophils Absolute: 0 10*3/uL (ref 0.0–0.1)
Basophils Relative: 0.6 % (ref 0.0–3.0)
Eosinophils Absolute: 0.1 10*3/uL (ref 0.0–0.7)
Eosinophils Relative: 0.9 % (ref 0.0–5.0)
HCT: 44.1 % (ref 36.0–46.0)
Hemoglobin: 14.3 g/dL (ref 12.0–15.0)
Lymphocytes Relative: 20.6 % (ref 12.0–46.0)
Lymphs Abs: 1.2 10*3/uL (ref 0.7–4.0)
MCHC: 32.4 g/dL (ref 30.0–36.0)
MCV: 89.8 fl (ref 78.0–100.0)
Monocytes Absolute: 0.4 10*3/uL (ref 0.1–1.0)
Monocytes Relative: 6.9 % (ref 3.0–12.0)
Neutro Abs: 4.3 10*3/uL (ref 1.4–7.7)
Neutrophils Relative %: 71 % (ref 43.0–77.0)
Platelets: 227 10*3/uL (ref 150.0–400.0)
RBC: 4.91 Mil/uL (ref 3.87–5.11)
RDW: 15.7 % — ABNORMAL HIGH (ref 11.5–15.5)
WBC: 6 10*3/uL (ref 4.0–10.5)

## 2023-08-20 LAB — IRON,TIBC AND FERRITIN PANEL
%SAT: 16 % (ref 16–45)
Ferritin: 47 ng/mL (ref 16–288)
Iron: 47 ug/dL (ref 45–160)
TIBC: 294 ug/dL (ref 250–450)

## 2023-08-20 LAB — HEMOGLOBIN A1C: Hgb A1c MFr Bld: 5.6 % (ref 4.6–6.5)

## 2023-08-20 LAB — LIPID PANEL
Cholesterol: 135 mg/dL (ref 0–200)
HDL: 46.7 mg/dL (ref >39.00)
LDL Cholesterol: 73 mg/dL (ref 0–99)
NonHDL: 88.37
Total CHOL/HDL Ratio: 3
Triglycerides: 76 mg/dL (ref 0.0–149.0)
VLDL: 15.2 mg/dL (ref 0.0–40.0)

## 2023-08-20 LAB — TSH: TSH: 3.7 u[IU]/mL (ref 0.35–5.50)

## 2023-08-20 NOTE — Progress Notes (Signed)
 Labs stable or in range  excep minor elevated on a liver test  A1c much better  urine shows no elevated protein , ldl is at goal . Iron level is  low normal at this time .  Forwarding info to dr Myna Hidalgo as Lorain Childes .    Plan repeat lfts panel in 3 months before next check  for the Zepbound

## 2023-08-21 ENCOUNTER — Encounter: Payer: Medicare Other | Attending: Psychology | Admitting: Psychology

## 2023-08-21 DIAGNOSIS — G479 Sleep disorder, unspecified: Secondary | ICD-10-CM | POA: Insufficient documentation

## 2023-08-21 DIAGNOSIS — F4310 Post-traumatic stress disorder, unspecified: Secondary | ICD-10-CM | POA: Insufficient documentation

## 2023-08-21 DIAGNOSIS — R4189 Other symptoms and signs involving cognitive functions and awareness: Secondary | ICD-10-CM | POA: Insufficient documentation

## 2023-08-21 DIAGNOSIS — F331 Major depressive disorder, recurrent, moderate: Secondary | ICD-10-CM | POA: Diagnosis not present

## 2023-08-21 DIAGNOSIS — R5383 Other fatigue: Secondary | ICD-10-CM | POA: Insufficient documentation

## 2023-08-21 DIAGNOSIS — R29818 Other symptoms and signs involving the nervous system: Secondary | ICD-10-CM | POA: Diagnosis present

## 2023-08-22 ENCOUNTER — Other Ambulatory Visit: Payer: Self-pay

## 2023-08-22 DIAGNOSIS — G622 Polyneuropathy due to other toxic agents: Secondary | ICD-10-CM

## 2023-08-22 DIAGNOSIS — E669 Obesity, unspecified: Secondary | ICD-10-CM

## 2023-08-22 DIAGNOSIS — Z79899 Other long term (current) drug therapy: Secondary | ICD-10-CM

## 2023-08-25 ENCOUNTER — Inpatient Hospital Stay: Payer: Medicare Other | Attending: Hematology & Oncology

## 2023-08-25 DIAGNOSIS — Z452 Encounter for adjustment and management of vascular access device: Secondary | ICD-10-CM | POA: Diagnosis not present

## 2023-08-25 DIAGNOSIS — C801 Malignant (primary) neoplasm, unspecified: Secondary | ICD-10-CM | POA: Insufficient documentation

## 2023-08-27 ENCOUNTER — Encounter: Payer: Self-pay | Admitting: Psychology
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
## 2023-09-02 ENCOUNTER — Encounter: Payer: Self-pay | Admitting: Psychology
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
## 2023-09-24 ENCOUNTER — Ambulatory Visit: Payer: Medicare Other | Admitting: Neurology

## 2023-09-24 ENCOUNTER — Encounter: Payer: Self-pay | Admitting: Neurology

## 2023-09-24 VITALS — BP 102/79 | HR 67 | Ht 67.0 in | Wt 174.0 lb

## 2023-09-24 DIAGNOSIS — G622 Polyneuropathy due to other toxic agents: Secondary | ICD-10-CM

## 2023-09-24 DIAGNOSIS — G63 Polyneuropathy in diseases classified elsewhere: Secondary | ICD-10-CM | POA: Diagnosis not present

## 2023-09-24 DIAGNOSIS — H8113 Benign paroxysmal vertigo, bilateral: Secondary | ICD-10-CM

## 2023-09-24 DIAGNOSIS — G4701 Insomnia due to medical condition: Secondary | ICD-10-CM

## 2023-09-24 DIAGNOSIS — K51919 Ulcerative colitis, unspecified with unspecified complications: Secondary | ICD-10-CM | POA: Insufficient documentation

## 2023-09-24 DIAGNOSIS — F1997 Other psychoactive substance use, unspecified with psychoactive substance-induced persisting dementia: Secondary | ICD-10-CM | POA: Insufficient documentation

## 2023-09-24 DIAGNOSIS — F331 Major depressive disorder, recurrent, moderate: Secondary | ICD-10-CM | POA: Insufficient documentation

## 2023-09-24 DIAGNOSIS — F4323 Adjustment disorder with mixed anxiety and depressed mood: Secondary | ICD-10-CM

## 2023-09-24 DIAGNOSIS — C801 Malignant (primary) neoplasm, unspecified: Secondary | ICD-10-CM

## 2023-09-24 DIAGNOSIS — C561 Malignant neoplasm of right ovary: Secondary | ICD-10-CM | POA: Insufficient documentation

## 2023-09-24 DIAGNOSIS — G43109 Migraine with aura, not intractable, without status migrainosus: Secondary | ICD-10-CM | POA: Diagnosis not present

## 2023-09-24 MED ORDER — PREGABALIN 200 MG PO CAPS: 200.0000 mg | ORAL_CAPSULE | Freq: Two times a day (BID) | ORAL | 1 refills | Status: DC

## 2023-09-24 MED ORDER — DONEPEZIL HCL 10 MG PO TABS: ORAL_TABLET | ORAL | 3 refills | Status: AC

## 2023-09-24 MED ORDER — AMPHETAMINE-DEXTROAMPHET ER 30 MG PO CP24: 30.0000 mg | ORAL_CAPSULE | Freq: Every day | ORAL | 0 refills | Status: DC

## 2023-09-24 MED ORDER — ZOLPIDEM TARTRATE 5 MG PO TABS: ORAL_TABLET | ORAL | 1 refills | Status: DC

## 2023-09-24 NOTE — Patient Instructions (Signed)
 Neuropathic Pain Neuropathic pain is pain caused by damage to the nerves that are responsible for certain sensations in your body (sensory nerves). Neuropathic pain can make you more sensitive to pain. Even a minor sensation can feel very painful. This is usually a long-term (chronic) condition that can be difficult to treat. The type of pain differs from person to person. It may: Start suddenly (acute), or it may develop slowly and become chronic. Come and go as damaged nerves heal, or it may stay at the same level for years. Cause emotional distress, loss of sleep, and a lower quality of life. What are the causes? The most common cause of this condition is diabetes. Many other diseases and conditions can also cause neuropathic pain. Causes of neuropathic pain can be classified as: Toxic. This is caused by medicines and chemicals. The most common causes of toxic neuropathic pain is damage from medicines that kill cancer cells (chemotherapy) or alcohol abuse. Metabolic. This can be caused by: Diabetes. Lack of vitamins like B12. Traumatic. Any injury that cuts, crushes, or stretches a nerve can cause damage and pain. Compression-related. If a sensory nerve gets trapped or compressed for a long period of time, the blood supply to the nerve can be cut off. Vascular. Many blood vessel diseases can cause neuropathic pain by decreasing blood supply and oxygen to nerves. Autoimmune. This type of pain results from diseases in which the body's defense system (immune system) mistakenly attacks sensory nerves. Examples of autoimmune diseases that can cause neuropathic pain include lupus and multiple sclerosis. Infectious. Many types of viral infections can damage sensory nerves and cause pain. Shingles infection is a common cause of this type of pain. Inherited. Neuropathic pain can be a symptom of many diseases that are passed down through families (genetic). What increases the risk? You are more likely to  develop this condition if: You have diabetes. You smoke. You drink too much alcohol. You are taking certain medicines, including chemotherapy or medicines that treat immune system disorders. What are the signs or symptoms? The main symptom is pain. Neuropathic pain is often described as: Burning. Shock-like. Stinging. Hot or cold. Itching. How is this diagnosed? No single test can diagnose neuropathic pain. It is diagnosed based on: A physical exam and your symptoms. Your health care provider will ask you about your pain. You may be asked to use a pain scale to describe how bad your pain is. Tests. These may be done to see if you have a cause and location of any nerve damage. They include: Nerve conduction studies and electromyography to test how well nerve signals travel through your nerves and muscles (electrodiagnostic testing). Skin biopsy to evaluate for small fiber neuropathy. Imaging studies, such as: X-rays. CT scan. MRI. How is this treated? Treatment for neuropathic pain may change over time. You may need to try different treatment options or a combination of treatments. Some options include: Treating the underlying cause of the neuropathy, such as diabetes, kidney disease, or vitamin deficiencies. Stopping medicines that can cause neuropathy, such as chemotherapy. Medicine to relieve pain. Medicines may include: Prescription or over-the-counter pain medicine. Anti-seizure medicine. Antidepressant medicines. Pain-relieving patches or creams that are applied to painful areas of skin. A medicine to numb the area (local anesthetic), which can be injected as a nerve block. Transcutaneous nerve stimulation. This uses electrical currents to block painful nerve signals. The treatment is painless. Alternative treatments, such as: Acupuncture. Meditation. Massage. Occupational or physical therapy. Pain management programs. Counseling. Follow  these instructions at  home: Medicines  Take over-the-counter and prescription medicines only as told by your health care provider. Ask your health care provider if the medicine prescribed to you: Requires you to avoid driving or using machinery. Can cause constipation. You may need to take these actions to prevent or treat constipation: Drink enough fluid to keep your urine pale yellow. Take over-the-counter or prescription medicines. Eat foods that are high in fiber, such as beans, whole grains, and fresh fruits and vegetables. Limit foods that are high in fat and processed sugars, such as fried or sweet foods. Lifestyle  Have a good support system at home. Consider joining a chronic pain support group. Do not use any products that contain nicotine or tobacco. These products include cigarettes, chewing tobacco, and vaping devices, such as e-cigarettes. If you need help quitting, ask your health care provider. Do not drink alcohol. General instructions Learn as much as you can about your condition. Work closely with all your health care providers to find the treatment plan that works best for you. Ask your health care provider what activities are safe for you. Keep all follow-up visits. This is important. Contact a health care provider if: Your pain treatments are not working. You are having side effects from your medicines. You are struggling with tiredness (fatigue), mood changes, depression, or anxiety. Get help right away if: You have thoughts of hurting yourself. Get help right away if you feel like you may hurt yourself or others, or have thoughts about taking your own life. Go to your nearest emergency room or: Call 911. Call the National Suicide Prevention Lifeline at (818)109-3270 or 988. This is open 24 hours a day. Text the Crisis Text Line at 5402519269. Summary Neuropathic pain is pain caused by damage to the nerves that are responsible for certain sensations in your body (sensory  nerves). Neuropathic pain may come and go as damaged nerves heal, or it may stay at the same level for years. Neuropathic pain is usually a long-term condition that can be difficult to treat. Consider joining a chronic pain support group. This information is not intended to replace advice given to you by your health care provider. Make sure you discuss any questions you have with your health care provider. Document Revised: 12/25/2020 Document Reviewed: 12/25/2020 [employer] Patient Education  2024 [employer]  ASSESSMENT AND PLAN 63 y.o. year old female  here with:   Daytime fatigue in the setting of major depression and cancer .     1) Need to refill Adderall fat 30 mg ER or daytime attention, focus and time management efficiency.     2) Chronic insomnia , takes Ambien  for ever  - prescription refill for 5 mg tabs  but can use up to 2 at night.    3) Lyrica  refill for 90 days. For progressing Neuropathy, now affecting both hands.      Next visit in 6 months can be virtual with NP.
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
## 2023-10-03 DIAGNOSIS — S0501XA Injury of conjunctiva and corneal abrasion without foreign body, right eye, initial encounter: Secondary | ICD-10-CM | POA: Diagnosis not present

## 2023-10-20 ENCOUNTER — Other Ambulatory Visit: Payer: Self-pay | Admitting: Neurology

## 2023-10-20 MED ORDER — AMPHETAMINE-DEXTROAMPHET ER 30 MG PO CP24: 30.0000 mg | ORAL_CAPSULE | Freq: Every day | ORAL | 0 refills | Status: DC

## 2023-10-20 NOTE — Telephone Encounter (Signed)
 Pt is requesting a refill for amphetamine -dextroamphetamine  (ADDERALL XR) 30 MG 24 hr capsule. (30 day only)  Pharmacy: [facility] [phone]

## 2023-10-20 NOTE — Telephone Encounter (Signed)
I have routed this request to Dr Dohmeier for review. The pt is due for the medication and  registry was verified.  

## 2023-10-27 ENCOUNTER — Inpatient Hospital Stay: Attending: Hematology & Oncology

## 2023-10-27 DIAGNOSIS — D509 Iron deficiency anemia, unspecified: Secondary | ICD-10-CM | POA: Insufficient documentation

## 2023-10-27 DIAGNOSIS — C801 Malignant (primary) neoplasm, unspecified: Secondary | ICD-10-CM

## 2023-10-27 DIAGNOSIS — D500 Iron deficiency anemia secondary to blood loss (chronic): Secondary | ICD-10-CM

## 2023-10-27 DIAGNOSIS — Z452 Encounter for adjustment and management of vascular access device: Secondary | ICD-10-CM | POA: Diagnosis not present

## 2023-10-27 DIAGNOSIS — Z95828 Presence of other vascular implants and grafts: Secondary | ICD-10-CM

## 2023-10-27 MED ORDER — HEPARIN SOD (PORK) LOCK FLUSH 100 UNIT/ML IV SOLN
500.0000 [IU] | Freq: Once | INTRAVENOUS | Status: AC | PRN
  Administered 2023-10-27: 500 [IU]

## 2023-10-27 MED ORDER — SODIUM CHLORIDE 0.9% FLUSH
10.0000 mL | Freq: Once | INTRAVENOUS | Status: AC | PRN
  Administered 2023-10-27: 10 mL

## 2023-10-28 ENCOUNTER — Other Ambulatory Visit: Payer: Self-pay

## 2023-10-28 MED ORDER — SPIRONOLACTONE 25 MG PO TABS: ORAL_TABLET | ORAL | 0 refills | Status: DC

## 2023-11-11 ENCOUNTER — Other Ambulatory Visit (INDEPENDENT_AMBULATORY_CARE_PROVIDER_SITE_OTHER)

## 2023-11-11 DIAGNOSIS — Z79899 Other long term (current) drug therapy: Secondary | ICD-10-CM | POA: Diagnosis not present

## 2023-11-11 DIAGNOSIS — E669 Obesity, unspecified: Secondary | ICD-10-CM | POA: Diagnosis not present

## 2023-11-11 DIAGNOSIS — G622 Polyneuropathy due to other toxic agents: Secondary | ICD-10-CM

## 2023-11-11 LAB — HEPATIC FUNCTION PANEL
ALT: 40 U/L — ABNORMAL HIGH (ref 0–35)
AST: 37 U/L (ref 0–37)
Albumin: 3.8 g/dL (ref 3.5–5.2)
Alkaline Phosphatase: 77 U/L (ref 39–117)
Bilirubin, Direct: 0.1 mg/dL (ref 0.0–0.3)
Total Bilirubin: 0.4 mg/dL (ref 0.2–1.2)
Total Protein: 6.1 g/dL (ref 6.0–8.3)

## 2023-11-19 ENCOUNTER — Encounter: Payer: Self-pay | Admitting: Internal Medicine

## 2023-11-19 ENCOUNTER — Telehealth: Admitting: Internal Medicine

## 2023-11-19 VITALS — BP 118/82

## 2023-11-19 DIAGNOSIS — I10 Essential (primary) hypertension: Secondary | ICD-10-CM

## 2023-11-19 DIAGNOSIS — E669 Obesity, unspecified: Secondary | ICD-10-CM

## 2023-11-19 DIAGNOSIS — Z9884 Bariatric surgery status: Secondary | ICD-10-CM | POA: Diagnosis not present

## 2023-11-19 DIAGNOSIS — Z79899 Other long term (current) drug therapy: Secondary | ICD-10-CM | POA: Diagnosis not present

## 2023-11-19 MED ORDER — TIRZEPATIDE-WEIGHT MANAGEMENT 10 MG/0.5ML ~~LOC~~ SOLN: 10.0000 mg | SUBCUTANEOUS | 2 refills | Status: AC

## 2023-11-19 NOTE — Progress Notes (Signed)
 Virtual Visit via Video Note  I connected with [name] on 11/19/23 at 12:15 PM EDT by a video enabled telemedicine application and verified that I am speaking with the correct person using two identifiers. Location patient: home Location provider:work office Persons participating in the virtual visit: patient, provider   Patient aware  of the limitations of evaluation and management by telemedicine and  availability of in person appointments. and agreed to proceed.   HPI: [name] presents for video visit  fu zepbound  meds  now on 7.5 lilly program   doing ok  no sig se but  Stopped bp when dizzy under 110 /70  Now 130/90 at timesbut average 120/80 range  although idastolic can risk  Weight 170  Staying active  gym and trainer   Ocass constipation  mag and fiber but no change  ROS: See pertinent positives and negatives per HPI. 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   Clotting disorder (HCC)    GERD (gastroesophageal reflux disease)    HLD (hyperlipidemia)    Hx pulmonary embolism    Hypertension    Iron  deficiency anemia due to chronic blood loss 11/04/2016   Metrorrhagia    Neuromuscular disorder (HCC)    Neuropathy    Obesity    Ovarian cancer (HCC) 04/2008   ChemoTherapy   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   Raynaud's syndrome    Right groin mass    Squamous cell carcinoma of skin    Ulcerative colitis (HCC)    Vision abnormalities    Vitamin D  deficiency     Past Surgical History:  Procedure Laterality Date   ABDOMINAL HYSTERECTOMY  05/2008   TAH/BSO   AUGMENTATION MAMMAPLASTY Bilateral 10+ years   BREAST BIOPSY Left 2020   BREAST BIOPSY Right    BREAST REDUCTION  SURGERY  2000   CESAREAN SECTION     1991   COLONOSCOPY  2004; 12/07/10   hemorrhoids   ESOPHAGOGASTRODUODENOSCOPY     GASTRIC BYPASS  1979   IR CV LINE INJECTION  03/13/2022   IR IMAGING GUIDED PORT INSERTION  03/07/2023   REDUCTION MAMMAPLASTY Bilateral    TOENAIL EXCISION Bilateral    TONSILLECTOMY      Family History  Problem Relation Age of Onset   Pulmonary embolism Mother    Hypertension Father    Breast cancer Maternal Grandmother        diagnosed in early 77s   Hyperlipidemia Other    Breast cancer Sister 55   Colon cancer Neg Hx    Esophageal cancer Neg Hx    Rectal cancer Neg Hx    Stomach cancer Neg Hx     Social History   Tobacco Use   Smoking status: Every Day    Current packs/day: 1.00    Average packs/day: 1 pack/day for 45.4 years (45.4 ttl pk-yrs)    Types: Cigarettes    Start date: 06/16/1978   Smokeless tobacco: Never   Tobacco comments:    2- 7-16  STILL SMOKING  Vaping Use   Vaping status: Never Used  Substance Use Topics   Alcohol use: Yes    Alcohol/week: 0.0  standard drinks of alcohol    Comment: rare, 3 weekly   Drug use: No      Current Outpatient Medications:    amphetamine -dextroamphetamine  (ADDERALL XR) 30 MG 24 hr capsule, Take 1 capsule (30 mg total) by mouth daily., Disp: 30 capsule, Rfl: 0   aspirin 81 MG tablet, Take 81 mg by mouth daily. 2 TABS IN AM, Disp: , Rfl:    butalbital -acetaminophen -caffeine  (FIORICET) 50-325-40 MG tablet, Take 1 tablet by mouth every 6 (six) hours as needed for headache (migraine abortion). TAKE 1 TABLET BY MOUTH EVERY 6 HOURS AS NEEDED FOR HEADACHE further refills should come from Neurology team ., Disp: 10 tablet, Rfl: 5   calcium  carbonate (TUMS - DOSED IN MG ELEMENTAL CALCIUM ) 500 MG chewable tablet, Chew 1 tablet by mouth daily as needed for indigestion or heartburn., Disp: , Rfl:    donepezil  (ARICEPT ) 10 MG tablet, TAKE 1 TABLET(10 MG) BY MOUTH EVERY MORNING, Disp: 90 tablet, Rfl: 3   DULoxetine   (CYMBALTA ) 60 MG capsule, Take 1 capsule (60 mg total) by mouth daily., Disp: 90 capsule, Rfl: 3   Multiple Vitamin (MULTIVITAMIN WITH MINERALS) TABS tablet, Take 1 tablet by mouth daily., Disp: , Rfl:    orphenadrine  (NORFLEX ) 100 MG tablet, TAKE ONE TABLET BY MOUTH TWICE DAILY AS NEEDED FOR MUSCLE SPASMS, Disp: 180 tablet, Rfl: 0   pregabalin  (LYRICA ) 200 MG capsule, Take 1 capsule (200 mg total) by mouth 2 (two) times daily., Disp: 180 capsule, Rfl: 1   rosuvastatin  (CRESTOR ) 5 MG tablet, TAKE 1 TABLET(5 MG) BY MOUTH DAILY, Disp: 90 tablet, Rfl: 3   tirzepatide  10 MG/0.5ML injection vial, Inject 10 mg into the skin once a week., Disp: 2 mL, Rfl: 2   tirzepatide  7.5 MG/0.5ML injection vial, Inject 7.5 mg into the skin once a week., Disp: 2 mL, Rfl: 2   Vitamin D , Ergocalciferol , (DRISDOL ) 1.25 MG (50000 UNIT) CAPS capsule, TAKE 1 CAPSULE BY MOUTH 1 TIME A WEEK, Disp: 13 capsule, Rfl: 2   zolpidem  (AMBIEN ) 5 MG tablet, TAKE 1 TO 2 TABLETS(5 TO 10 MG) BY MOUTH EVERY NIGHT AT BEDTIME AS NEEDED, Disp: 180 tablet, Rfl: 1   amLODipine  (NORVASC ) 10 MG tablet, TAKE 1 TABLET(10 MG) BY MOUTH DAILY (Patient not taking: Reported on 11/19/2023), Disp: 90 tablet, Rfl: 1   spironolactone  (ALDACTONE ) 25 MG tablet, TAKE 1 TABLET(25 MG) BY MOUTH DAILY (Patient not taking: Reported on 11/19/2023), Disp: 90 tablet, Rfl: 0 No current facility-administered medications for this visit.  Facility-Administered Medications Ordered in Other Visits:    heparin  lock flush 100 unit/mL, 500 Units, Intravenous, Once, Ennever, Maude SAUNDERS, MD   sodium chloride  flush (NS) 0.9 % injection 10 mL, 10 mL, Intravenous, PRN, Ennever, Peter R, MD  EXAM: BP Readings from Last 3 Encounters:  11/19/23 118/82  09/24/23 102/79  08/19/23 110/70   Wt Readings from Last 3 Encounters:  09/24/23 174 lb (78.9 kg)  08/19/23 179 lb 12.8 oz (81.6 kg)  05/21/23 191 lb (86.6 kg)     VITALS per patient if applicable:  GENERAL: alert, oriented,  appears well and in no acute distress  HEENT: atraumatic, conjunttiva clear, no obvious abnormalities on inspection of external nose and ears  NECK: normal movements of the head and neck  LUNGS: on inspection no signs of respiratory distress, breathing rate appears normal, no obvious gross SOB, gasping or wheezing  CV: no obvious cyanosis  MS: moves all visible extremities without noticeable abnormality  PSYCH/NEURO: pleasant and cooperative, no  obvious depression or anxiety, speech and thought processing grossly intact Lab Results  Component Value Date   WBC 6.0 08/19/2023   HGB 14.3 08/19/2023   HCT 44.1 08/19/2023   PLT 227.0 08/19/2023   GLUCOSE 69 (L) 08/19/2023   CHOL 135 08/19/2023   TRIG 76.0 08/19/2023   HDL 46.70 08/19/2023   LDLDIRECT 98.7 09/04/2009   LDLCALC 73 08/19/2023   ALT 40 (H) 11/11/2023   AST 37 11/11/2023   NA 137 08/19/2023   K 4.1 08/19/2023   CL 102 08/19/2023   CREATININE 0.88 08/19/2023   BUN 24 (H) 08/19/2023   CO2 27 08/19/2023   TSH 3.70 08/19/2023   INR 0.86 06/02/2013   HGBA1C 5.6 08/19/2023   MICROALBUR 1.1 08/20/2023    ASSESSMENT AND PLAN:  Discussed the following assessment and plan:    ICD-10-CM   1. Obesity (BMI 30-39.9)  E66.9     2. Essential hypertension  I10     3. Medication management  Z79.899     4. Bariatric surgery status  Z98.84      Ok to stay off of BP  meds but  monitor  If  needed can resart 1/2 10 amlodipine   or 5 mg per day and let us  know and we can fu  After current rx ok to inc to 10 mg zepbound  to be sent in vial  Plan fu bmp and lfts  in 2-3 months  and fu appt visit or  virtual after beginning the 10 mg for 1-2 months and go from there  Counseled.  Continue weight training  lifestyle optimization  Expectant management and discussion of plan and treatment with opportunity to ask questions and all were answered. The patient agreed with the plan and demonstrated an understanding of the instructions.    Advised to call back or seek an in-person evaluation if worsening  or having  further concerns  in interim. Return for lab in a few months and fu appt  see notes .    Apolinar Eastern, MD

## 2023-11-23 ENCOUNTER — Other Ambulatory Visit: Payer: Self-pay | Admitting: Internal Medicine

## 2023-11-23 DIAGNOSIS — R7989 Other specified abnormal findings of blood chemistry: Secondary | ICD-10-CM

## 2023-11-26 ENCOUNTER — Other Ambulatory Visit: Payer: Self-pay | Admitting: Internal Medicine

## 2023-11-26 NOTE — Telephone Encounter (Signed)
 Spoke to the patient and she would like a prescription of Zepbound  10 mg.

## 2023-11-26 NOTE — Telephone Encounter (Signed)
 Please confrim that she  needs the 7.5 mg per week vial  my last note says we could increase if tolerating after a few months

## 2023-12-18 ENCOUNTER — Other Ambulatory Visit: Payer: Self-pay | Admitting: Neurology

## 2023-12-18 MED ORDER — AMPHETAMINE-DEXTROAMPHET ER 30 MG PO CP24: 30.0000 mg | ORAL_CAPSULE | Freq: Every day | ORAL | 0 refills | Status: DC

## 2023-12-18 NOTE — Telephone Encounter (Signed)
I have routed this request to Dr Dohmeier for review. The pt is due for the medication and  registry was verified.  

## 2023-12-18 NOTE — Telephone Encounter (Signed)
 Pt is requesting a refill for amphetamine -dextroamphetamine  (ADDERALL XR) 30 MG 24 hr capsule.  Pharmacy: [facility] [phone]

## 2023-12-25 ENCOUNTER — Other Ambulatory Visit: Payer: Self-pay | Admitting: Internal Medicine

## 2023-12-29 ENCOUNTER — Encounter: Payer: Self-pay | Admitting: Neurology

## 2023-12-30 ENCOUNTER — Other Ambulatory Visit: Payer: Self-pay

## 2023-12-30 DIAGNOSIS — H53143 Visual discomfort, bilateral: Secondary | ICD-10-CM

## 2023-12-30 DIAGNOSIS — G4701 Insomnia due to medical condition: Secondary | ICD-10-CM

## 2023-12-30 DIAGNOSIS — G43109 Migraine with aura, not intractable, without status migrainosus: Secondary | ICD-10-CM

## 2023-12-30 DIAGNOSIS — H8113 Benign paroxysmal vertigo, bilateral: Secondary | ICD-10-CM

## 2023-12-31 ENCOUNTER — Other Ambulatory Visit: Payer: Self-pay | Admitting: Hematology & Oncology

## 2023-12-31 ENCOUNTER — Inpatient Hospital Stay: Payer: Medicare Other

## 2023-12-31 ENCOUNTER — Inpatient Hospital Stay: Admitting: Licensed Clinical Social Worker

## 2023-12-31 ENCOUNTER — Inpatient Hospital Stay: Payer: Medicare Other | Attending: Hematology & Oncology

## 2023-12-31 ENCOUNTER — Inpatient Hospital Stay: Payer: Medicare Other | Admitting: Hematology & Oncology

## 2023-12-31 VITALS — BP 146/89 | HR 82 | Temp 97.8°F | Resp 17 | Wt 171.0 lb

## 2023-12-31 DIAGNOSIS — M542 Cervicalgia: Secondary | ICD-10-CM | POA: Diagnosis not present

## 2023-12-31 DIAGNOSIS — D509 Iron deficiency anemia, unspecified: Secondary | ICD-10-CM | POA: Diagnosis not present

## 2023-12-31 DIAGNOSIS — M47892 Other spondylosis, cervical region: Secondary | ICD-10-CM | POA: Diagnosis not present

## 2023-12-31 DIAGNOSIS — C561 Malignant neoplasm of right ovary: Secondary | ICD-10-CM

## 2023-12-31 DIAGNOSIS — G63 Polyneuropathy in diseases classified elsewhere: Secondary | ICD-10-CM | POA: Diagnosis not present

## 2023-12-31 DIAGNOSIS — Z859 Personal history of malignant neoplasm, unspecified: Secondary | ICD-10-CM | POA: Insufficient documentation

## 2023-12-31 DIAGNOSIS — D500 Iron deficiency anemia secondary to blood loss (chronic): Secondary | ICD-10-CM

## 2023-12-31 DIAGNOSIS — Z9221 Personal history of antineoplastic chemotherapy: Secondary | ICD-10-CM | POA: Insufficient documentation

## 2023-12-31 DIAGNOSIS — M170 Bilateral primary osteoarthritis of knee: Secondary | ICD-10-CM | POA: Diagnosis not present

## 2023-12-31 DIAGNOSIS — M1711 Unilateral primary osteoarthritis, right knee: Secondary | ICD-10-CM | POA: Diagnosis not present

## 2023-12-31 DIAGNOSIS — M1712 Unilateral primary osteoarthritis, left knee: Secondary | ICD-10-CM | POA: Diagnosis not present

## 2023-12-31 DIAGNOSIS — C801 Malignant (primary) neoplasm, unspecified: Secondary | ICD-10-CM

## 2023-12-31 LAB — CBC WITH DIFFERENTIAL (CANCER CENTER ONLY)
Abs Immature Granulocytes: 0.03 K/uL (ref 0.00–0.07)
Basophils Absolute: 0 K/uL (ref 0.0–0.1)
Basophils Relative: 0 %
Eosinophils Absolute: 0.2 K/uL (ref 0.0–0.5)
Eosinophils Relative: 2 %
HCT: 40 % (ref 36.0–46.0)
Hemoglobin: 13.4 g/dL (ref 12.0–15.0)
Immature Granulocytes: 0 %
Lymphocytes Relative: 18 %
Lymphs Abs: 1.5 K/uL (ref 0.7–4.0)
MCH: 29.2 pg (ref 26.0–34.0)
MCHC: 33.5 g/dL (ref 30.0–36.0)
MCV: 87.1 fL (ref 80.0–100.0)
Monocytes Absolute: 0.6 K/uL (ref 0.1–1.0)
Monocytes Relative: 8 %
Neutro Abs: 5.6 K/uL (ref 1.7–7.7)
Neutrophils Relative %: 72 %
Platelet Count: 237 K/uL (ref 150–400)
RBC: 4.59 MIL/uL (ref 3.87–5.11)
RDW: 14.9 % (ref 11.5–15.5)
WBC Count: 7.9 K/uL (ref 4.0–10.5)
nRBC: 0 % (ref 0.0–0.2)

## 2023-12-31 LAB — FERRITIN: Ferritin: 112 ng/mL (ref 11–307)

## 2023-12-31 LAB — CMP (CANCER CENTER ONLY)
ALT: 25 U/L (ref 0–44)
AST: 24 U/L (ref 15–41)
Albumin: 4.1 g/dL (ref 3.5–5.0)
Alkaline Phosphatase: 92 U/L (ref 38–126)
Anion gap: 10 (ref 5–15)
BUN: 18 mg/dL (ref 8–23)
CO2: 26 mmol/L (ref 22–32)
Calcium: 9.6 mg/dL (ref 8.9–10.3)
Chloride: 101 mmol/L (ref 98–111)
Creatinine: 0.86 mg/dL (ref 0.44–1.00)
GFR, Estimated: >60 mL/min (ref >60)
Glucose, Bld: 93 mg/dL (ref 70–99)
Potassium: 3.6 mmol/L (ref 3.5–5.1)
Sodium: 138 mmol/L (ref 135–145)
Total Bilirubin: 0.4 mg/dL (ref 0.0–1.2)
Total Protein: 6.7 g/dL (ref 6.5–8.1)

## 2023-12-31 LAB — IRON AND IRON BINDING CAPACITY (CC-WL,HP ONLY)
Iron: 48 ug/dL (ref 28–170)
Saturation Ratios: 16 % (ref 10.4–31.8)
TIBC: 295 ug/dL (ref 250–450)
UIBC: 247 ug/dL

## 2023-12-31 LAB — RETICULOCYTES
Immature Retic Fract: 20.7 % — ABNORMAL HIGH (ref 2.3–15.9)
RBC.: 4.51 MIL/uL (ref 3.87–5.11)
Retic Count, Absolute: 78.5 K/uL (ref 19.0–186.0)
Retic Ct Pct: 1.7 % (ref 0.4–3.1)

## 2023-12-31 LAB — LACTATE DEHYDROGENASE: LDH: 186 U/L (ref 98–192)

## 2023-12-31 MED ORDER — BUTALBITAL-APAP-CAFFEINE 50-325-40 MG PO TABS: ORAL_TABLET | ORAL | 1 refills | Status: AC

## 2023-12-31 NOTE — Patient Instructions (Signed)

## 2023-12-31 NOTE — Progress Notes (Signed)
 [facility] CSW Progress Note  Clinical Child psychotherapist met with patient per the request of Dr. Timmy.    Interventions: Patient discussed her cancer journey.  She was interested in helping other patients on theirs.  Made referral to [facility] per patient's request.      Follow Up Plan:  CSW will follow-up with patient by phone     Macario [name] Au, LCSW Clinical Social Worker [facility]

## 2023-12-31 NOTE — Progress Notes (Signed)
 Hematology and Oncology Follow Up Visit  [name] [id number] 1960-09-17 63 y.o. 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  We last saw her about a year ago.  Since then, she has been doing okay.  She wants to help counsel cancer patients.  I think she would be very good at this that she has been through cancer itself.  I will have her speak with Macario, our social worker who might be able to help her out.  She has lost quite a bit of weight.  She is exercising.  She is very happy about the weight loss.  Not very impressed with her weight loss.  I think she probably has lost almost 20 pounds since we last saw her.  Again, there has been no issues with respect to cancer recurring.  Her last iron  studies that were done back in April showed a ferritin of 47 with an iron  saturation of 16%.  Overall, I would have said that her performance status is probably ECOG 0.   Medications:  Allergies as of 12/31/2023       Reactions   Codeine Nausea Only   Orange Itching, Rash        Medication List        Accurate as of December 31, 2023 12:36 PM. If you have any questions, ask your nurse or doctor.          amLODipine  10 MG tablet Commonly known as: NORVASC  TAKE 1 TABLET(10 MG) BY MOUTH DAILY   amphetamine -dextroamphetamine  30 MG 24 hr capsule Commonly known as: Adderall XR Take 1 capsule (30 mg total) by mouth daily.   aspirin 81 MG tablet Take 81 mg by mouth daily. 2 TABS IN AM   butalbital -acetaminophen -caffeine  50-325-40 MG tablet Commonly known as: FIORICET Take 1 tablet by mouth every 6 (six) hours as needed for headache (migraine abortion). TAKE 1 TABLET BY MOUTH EVERY 6 HOURS AS NEEDED FOR HEADACHE  further refills should come from Neurology team .   calcium  carbonate 500 MG chewable tablet Commonly known as: TUMS - dosed in mg elemental calcium  Chew 1 tablet by mouth daily as needed for indigestion or heartburn.   donepezil  10 MG tablet Commonly known as: ARICEPT  TAKE 1 TABLET(10 MG) BY MOUTH EVERY MORNING   DULoxetine  60 MG capsule Commonly known as: CYMBALTA  Take 1 capsule (60 mg total) by mouth daily.   multivitamin with minerals Tabs tablet Take 1 tablet by mouth daily.   orphenadrine  100 MG tablet Commonly known as: NORFLEX  TAKE ONE TABLET BY MOUTH TWICE DAILY AS NEEDED FOR MUSCLE SPASMS   pregabalin  200 MG capsule Commonly known as: LYRICA  Take 1 capsule (200 mg total) by mouth 2 (two) times daily.   rosuvastatin  5 MG tablet Commonly known as: CRESTOR  TAKE 1 TABLET(5 MG) BY MOUTH DAILY   spironolactone  25 MG tablet Commonly known as: ALDACTONE  TAKE 1 TABLET(25 MG) BY MOUTH DAILY   tirzepatide  10 MG/0.5ML injection vial Inject 10 mg into the skin once a week.   tirzepatide  10 MG/0.5ML injection vial Inject 10 mg into the skin once a week. Disp one month supply   Vitamin D  (Ergocalciferol ) 1.25 MG (50000 UNIT) Caps capsule Commonly known as: DRISDOL  TAKE 1 CAPSULE BY MOUTH 1 TIME A WEEK   zolpidem  5  MG tablet Commonly known as: AMBIEN  TAKE 1 TO 2 TABLETS(5 TO 10 MG) BY MOUTH EVERY NIGHT AT BEDTIME AS NEEDED        Allergies:  Allergies  Allergen Reactions   Codeine Nausea Only   Orange Itching and Rash    Past Medical History, Surgical history, Social history, and Family History were reviewed and updated.  Review of Systems: Review of Systems  Constitutional: Negative.   HENT: Negative.    Eyes: Negative.   Respiratory: Negative.    Cardiovascular: Negative.   Gastrointestinal:  Positive for abdominal pain.  Genitourinary: Negative.   Musculoskeletal:  Positive for joint pain.  Skin: Negative.   Neurological:  Positive for tingling.   Endo/Heme/Allergies: Negative.   Psychiatric/Behavioral: Negative.        Physical Exam:  weight is 171 lb 0.6 oz (77.6 kg). Her oral temperature is 97.8 F (36.6 C). Her blood pressure is 146/89 (abnormal) and her pulse is 82. Her respiration is 17 and oxygen saturation is 100%.   Wt Readings from Last 3 Encounters:  12/31/23 171 lb 0.6 oz (77.6 kg)  09/24/23 174 lb (78.9 kg)  08/19/23 179 lb 12.8 oz (81.6 kg)  Her vital signs show temperature of 97.9.  Pulse 92.  Blood pressure 132/91.  Weight is 214 pounds.  Physical Exam Vitals reviewed.  Constitutional:      Comments: Her breast exam shows bilateral breast reductions.  I really cannot palpate any thing unusual with respect to the right breast.  There is no distinct mass.  There is no axillary adenopathy.  HENT:     Head: Normocephalic and atraumatic.  Eyes:     Pupils: Pupils are equal, round, and reactive to light.  Cardiovascular:     Rate and Rhythm: Normal rate and regular rhythm.     Heart sounds: Normal heart sounds.  Pulmonary:     Effort: Pulmonary effort is normal.     Breath sounds: Normal breath sounds.  Abdominal:     General: Bowel sounds are normal.     Palpations: Abdomen is soft.  Musculoskeletal:        General: No tenderness or deformity. Normal range of motion.     Cervical back: Normal range of motion.     Comments: Extremities shows decreased range of motion of the right shoulder.  She has decreased abduction and external rotation.  Lymphadenopathy:     Cervical: No cervical adenopathy.  Skin:    General: Skin is warm and dry.     Findings: No erythema or rash.  Neurological:     Mental Status: She is alert and oriented to person, place, and time.  Psychiatric:        Behavior: Behavior normal.        Thought Content: Thought content normal.        Judgment: Judgment normal.      Lab Results  Component Value Date   WBC 7.9 12/31/2023   HGB 13.4 12/31/2023   HCT 40.0 12/31/2023   MCV  87.1 12/31/2023   PLT 237 12/31/2023   Lab Results  Component Value Date   FERRITIN 47 08/19/2023   IRON  47 08/19/2023   TIBC 294 08/19/2023   UIBC 323 01/20/2023   IRONPCTSAT 16 08/19/2023   Lab Results  Component Value Date   RETICCTPCT 1.7 12/31/2023   RBC 4.51 12/31/2023   RBC 4.59 12/31/2023   No results found for: KPAFRELGTCHN, LAMBDASER, KAPLAMBRATIO No results found for: IGGSERUM, IGA, IGMSERUM No  results found for: STEPHANY CARLOTA BENSON MARKEL EARLA JOANNIE DOC VICK, SPEI   Chemistry      Component Value Date/Time   NA 137 08/19/2023 1525   NA 147 (H) 04/16/2017 1152   NA 139 12/27/2015 1156   K 4.1 08/19/2023 1525   K 3.4 04/16/2017 1152   K 3.9 12/27/2015 1156   CL 102 08/19/2023 1525   CL 101 04/16/2017 1152   CO2 27 08/19/2023 1525   CO2 30 04/16/2017 1152   CO2 27 12/27/2015 1156   BUN 24 (H) 08/19/2023 1525   BUN 18 04/16/2017 1152   BUN 14.9 12/27/2015 1156   CREATININE 0.88 08/19/2023 1525   CREATININE 0.98 01/20/2023 0948   CREATININE 1.07 (H) 03/01/2020 1626   CREATININE 0.9 12/27/2015 1156      Component Value Date/Time   CALCIUM  10.0 08/19/2023 1525   CALCIUM  9.8 04/16/2017 1152   CALCIUM  9.7 12/27/2015 1156   ALKPHOS 77 11/11/2023 1321   ALKPHOS 87 (H) 04/16/2017 1152   ALKPHOS 106 12/27/2015 1156   AST 37 11/11/2023 1321   AST 33 01/20/2023 0948   AST 29 12/27/2015 1156   ALT 40 (H) 11/11/2023 1321   ALT 38 01/20/2023 0948   ALT 26 04/16/2017 1152   ALT 28 12/27/2015 1156   BILITOT 0.4 11/11/2023 1321   BILITOT 0.2 (L) 01/20/2023 0948   BILITOT 0.33 12/27/2015 1156       Impression and Plan: Ms. 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 She completed 6 cycles of Carboplatin/Taxotere over 13 years ago.   Again, she really looks quite good.  We will have to see what her iron   studies look like.  I am just very impressed with her motivation.  I think she would really be good with counseling cancer survivors.  I know that she has unique perspectives.  I know that her past experiences could really help other patients.  From our point of view, we will plan to get her back in another 6 months.  She still has a Port-A-Cath so we have to make sure we flushes every couple of months.   Maude JONELLE Crease, MD 8/20/202512:36 PM

## 2024-01-05 ENCOUNTER — Ambulatory Visit: Payer: Self-pay | Admitting: Hematology & Oncology

## 2024-01-22 DIAGNOSIS — M9901 Segmental and somatic dysfunction of cervical region: Secondary | ICD-10-CM | POA: Diagnosis not present

## 2024-01-22 DIAGNOSIS — M9902 Segmental and somatic dysfunction of thoracic region: Secondary | ICD-10-CM | POA: Diagnosis not present

## 2024-01-22 DIAGNOSIS — R519 Headache, unspecified: Secondary | ICD-10-CM | POA: Diagnosis not present

## 2024-01-22 DIAGNOSIS — S29012A Strain of muscle and tendon of back wall of thorax, initial encounter: Secondary | ICD-10-CM | POA: Diagnosis not present

## 2024-01-26 ENCOUNTER — Other Ambulatory Visit: Payer: Self-pay | Admitting: Internal Medicine

## 2024-01-26 DIAGNOSIS — R7303 Prediabetes: Secondary | ICD-10-CM

## 2024-01-26 DIAGNOSIS — M9901 Segmental and somatic dysfunction of cervical region: Secondary | ICD-10-CM | POA: Diagnosis not present

## 2024-01-26 DIAGNOSIS — S29012A Strain of muscle and tendon of back wall of thorax, initial encounter: Secondary | ICD-10-CM | POA: Diagnosis not present

## 2024-01-26 DIAGNOSIS — E669 Obesity, unspecified: Secondary | ICD-10-CM

## 2024-01-26 DIAGNOSIS — R519 Headache, unspecified: Secondary | ICD-10-CM | POA: Diagnosis not present

## 2024-01-26 DIAGNOSIS — I10 Essential (primary) hypertension: Secondary | ICD-10-CM

## 2024-01-26 DIAGNOSIS — G622 Polyneuropathy due to other toxic agents: Secondary | ICD-10-CM

## 2024-01-26 DIAGNOSIS — Z9884 Bariatric surgery status: Secondary | ICD-10-CM

## 2024-01-26 DIAGNOSIS — M9902 Segmental and somatic dysfunction of thoracic region: Secondary | ICD-10-CM | POA: Diagnosis not present

## 2024-01-26 DIAGNOSIS — Z79899 Other long term (current) drug therapy: Secondary | ICD-10-CM

## 2024-01-28 DIAGNOSIS — M542 Cervicalgia: Secondary | ICD-10-CM | POA: Diagnosis not present

## 2024-01-29 ENCOUNTER — Other Ambulatory Visit: Payer: Self-pay | Admitting: Neurology

## 2024-01-29 DIAGNOSIS — M9902 Segmental and somatic dysfunction of thoracic region: Secondary | ICD-10-CM | POA: Diagnosis not present

## 2024-01-29 DIAGNOSIS — R519 Headache, unspecified: Secondary | ICD-10-CM | POA: Diagnosis not present

## 2024-01-29 DIAGNOSIS — S29012A Strain of muscle and tendon of back wall of thorax, initial encounter: Secondary | ICD-10-CM | POA: Diagnosis not present

## 2024-01-29 DIAGNOSIS — M9901 Segmental and somatic dysfunction of cervical region: Secondary | ICD-10-CM | POA: Diagnosis not present

## 2024-01-29 MED ORDER — AMPHETAMINE-DEXTROAMPHET ER 30 MG PO CP24: 30.0000 mg | ORAL_CAPSULE | Freq: Every day | ORAL | 0 refills | Status: DC

## 2024-01-29 NOTE — Telephone Encounter (Signed)
 Requested Prescriptions   Pending Prescriptions Disp Refills   amphetamine -dextroamphetamine  (ADDERALL XR) 30 MG 24 hr capsule 30 capsule 0    Sig: Take 1 capsule (30 mg total) by mouth daily.   Last seen 09/24/23 Next appt 03/25/24  Dispenses   Dispensed Days Supply Quantity Provider Pharmacy  D-AMPHETAMINE  ER 30MG  SALT COMBO CP 12/19/2023 30 30 each Dohmeier, Dedra, MD [facility] DR[facility] STORE #...  D-AMPHETAMINE  ER 30MG  SALT COMBO CP 10/27/2023 30 30 each Dohmeier, Dedra, MD [facility] DRUG STORE #...  D-AMPHETAMINE  ER 30MG  SALT COMBO CP 09/28/2023 30 30 each Dohmeier, Dedra, MD [facility] DR[facility] STORE #...  D-AMPHETAMINE  ER 20MG  SALT COMBO CP 08/18/2023 30 30 each Penumalli, Vikram R, MD [facility] DRUG STORE #...  D-AMPHETAMINE  ER 20MG  SALT COMBO CP 03/27/2023 90 90 each Dohmeier, Dedra, MD [facility] DRUG STORE #...  amphetamine -dextroamphetamine  (ADDERALL XR) 20 MG 24 hr capsule 02/28/2023 30 30 capsule Camara, Amadou, MD [facility].[email or web address]

## 2024-01-29 NOTE — Telephone Encounter (Signed)
 Pt called and is needing a refill request for her amphetamine -dextroamphetamine  (ADDERALL XR) 30 MG 24 hr capsule sent in to the [facility] on [address]

## 2024-02-02 ENCOUNTER — Other Ambulatory Visit: Payer: Self-pay | Admitting: Orthopaedic Surgery

## 2024-02-02 DIAGNOSIS — M9902 Segmental and somatic dysfunction of thoracic region: Secondary | ICD-10-CM | POA: Diagnosis not present

## 2024-02-02 DIAGNOSIS — M542 Cervicalgia: Secondary | ICD-10-CM

## 2024-02-02 DIAGNOSIS — Z8669 Personal history of other diseases of the nervous system and sense organs: Secondary | ICD-10-CM

## 2024-02-02 DIAGNOSIS — S29012A Strain of muscle and tendon of back wall of thorax, initial encounter: Secondary | ICD-10-CM | POA: Diagnosis not present

## 2024-02-02 DIAGNOSIS — R519 Headache, unspecified: Secondary | ICD-10-CM | POA: Diagnosis not present

## 2024-02-02 DIAGNOSIS — M9901 Segmental and somatic dysfunction of cervical region: Secondary | ICD-10-CM | POA: Diagnosis not present

## 2024-02-03 ENCOUNTER — Other Ambulatory Visit: Payer: Self-pay | Admitting: Internal Medicine

## 2024-02-05 DIAGNOSIS — M9901 Segmental and somatic dysfunction of cervical region: Secondary | ICD-10-CM | POA: Diagnosis not present

## 2024-02-05 DIAGNOSIS — S29012A Strain of muscle and tendon of back wall of thorax, initial encounter: Secondary | ICD-10-CM | POA: Diagnosis not present

## 2024-02-05 DIAGNOSIS — M9902 Segmental and somatic dysfunction of thoracic region: Secondary | ICD-10-CM | POA: Diagnosis not present

## 2024-02-05 DIAGNOSIS — R519 Headache, unspecified: Secondary | ICD-10-CM | POA: Diagnosis not present

## 2024-02-09 DIAGNOSIS — R519 Headache, unspecified: Secondary | ICD-10-CM | POA: Diagnosis not present

## 2024-02-09 DIAGNOSIS — S29012A Strain of muscle and tendon of back wall of thorax, initial encounter: Secondary | ICD-10-CM | POA: Diagnosis not present

## 2024-02-09 DIAGNOSIS — M9902 Segmental and somatic dysfunction of thoracic region: Secondary | ICD-10-CM | POA: Diagnosis not present

## 2024-02-09 DIAGNOSIS — M9901 Segmental and somatic dysfunction of cervical region: Secondary | ICD-10-CM | POA: Diagnosis not present

## 2024-02-12 ENCOUNTER — Encounter: Payer: Self-pay | Admitting: Internal Medicine

## 2024-02-12 ENCOUNTER — Other Ambulatory Visit: Payer: Self-pay | Admitting: *Deleted

## 2024-02-12 DIAGNOSIS — H8113 Benign paroxysmal vertigo, bilateral: Secondary | ICD-10-CM

## 2024-02-12 DIAGNOSIS — G4701 Insomnia due to medical condition: Secondary | ICD-10-CM

## 2024-02-12 DIAGNOSIS — G43109 Migraine with aura, not intractable, without status migrainosus: Secondary | ICD-10-CM

## 2024-02-12 DIAGNOSIS — H53143 Visual discomfort, bilateral: Secondary | ICD-10-CM

## 2024-02-12 MED ORDER — BUTALBITAL-ASPIRIN-CAFFEINE 50-325-40 MG PO CAPS: 1.0000 | ORAL_CAPSULE | Freq: Two times a day (BID) | ORAL | 0 refills | Status: DC | PRN

## 2024-02-12 NOTE — Telephone Encounter (Signed)
 Pt request refill of butalbital  in mychart encounter.  Last visit 09/24/23 Next visit: 03/25/24 Last fill:

## 2024-02-12 NOTE — Telephone Encounter (Signed)
 Last visit 09/24/23 Next visit: 03/25/24 Last fill:  Rx request sent to Dr Chalice

## 2024-02-12 NOTE — Addendum Note (Signed)
 Addended by: CHALICE SAUNAS on: 02/12/2024 07:31 PM   Modules accepted: Orders

## 2024-02-16 ENCOUNTER — Ambulatory Visit
Admission: RE | Admit: 2024-02-16 | Discharge: 2024-02-16 | Disposition: A | Discharge status: Home / Self Care | Source: Ambulatory Visit | Attending: Orthopaedic Surgery | Admitting: Orthopaedic Surgery

## 2024-02-16 DIAGNOSIS — Z8669 Personal history of other diseases of the nervous system and sense organs: Secondary | ICD-10-CM

## 2024-02-16 DIAGNOSIS — G43909 Migraine, unspecified, not intractable, without status migrainosus: Secondary | ICD-10-CM | POA: Diagnosis not present

## 2024-02-16 DIAGNOSIS — M4312 Spondylolisthesis, cervical region: Secondary | ICD-10-CM | POA: Diagnosis not present

## 2024-02-16 DIAGNOSIS — M47812 Spondylosis without myelopathy or radiculopathy, cervical region: Secondary | ICD-10-CM | POA: Diagnosis not present

## 2024-02-16 DIAGNOSIS — M542 Cervicalgia: Secondary | ICD-10-CM

## 2024-02-17 DIAGNOSIS — S29012A Strain of muscle and tendon of back wall of thorax, initial encounter: Secondary | ICD-10-CM | POA: Diagnosis not present

## 2024-02-17 DIAGNOSIS — R519 Headache, unspecified: Secondary | ICD-10-CM | POA: Diagnosis not present

## 2024-02-17 DIAGNOSIS — M9901 Segmental and somatic dysfunction of cervical region: Secondary | ICD-10-CM | POA: Diagnosis not present

## 2024-02-17 DIAGNOSIS — M9902 Segmental and somatic dysfunction of thoracic region: Secondary | ICD-10-CM | POA: Diagnosis not present

## 2024-02-23 ENCOUNTER — Encounter: Payer: Self-pay | Admitting: Psychology

## 2024-02-23 ENCOUNTER — Encounter: Payer: Medicare Other | Attending: Psychology | Admitting: Psychology

## 2024-02-23 DIAGNOSIS — G479 Sleep disorder, unspecified: Secondary | ICD-10-CM | POA: Diagnosis present

## 2024-02-23 DIAGNOSIS — F331 Major depressive disorder, recurrent, moderate: Secondary | ICD-10-CM | POA: Diagnosis present

## 2024-02-23 DIAGNOSIS — R4189 Other symptoms and signs involving cognitive functions and awareness: Secondary | ICD-10-CM | POA: Diagnosis present

## 2024-02-23 DIAGNOSIS — R29818 Other symptoms and signs involving the nervous system: Secondary | ICD-10-CM | POA: Insufficient documentation

## 2024-02-23 DIAGNOSIS — F4310 Post-traumatic stress disorder, unspecified: Secondary | ICD-10-CM | POA: Insufficient documentation

## 2024-02-23 NOTE — Progress Notes (Signed)
 Neuropsychology Visit  Patient:  [name]   DOB: [birth year]  MR Number: [id number]  Location: [facility] AND REHABILITATION [address] [place], [address] [place] [address] Dept: [phone]  Date of Service: 02/23/2024  Start: 2 PM End: P.m.  Today's visit was an in person visit that was conducted in my outpatient clinic office.  The patient and myself were present for this visit.   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  The patient has had residual cognitive changes similar to possible impacts of chemotherapy as well as significant residual pain symptoms.  The patient was diagnosed and treated for cancer 10 years ago and continues to have ongoing follow-up regarding possible recurrence.  The patient has had neuropathy, insomnia and memory loss post chemotherapy.  The patient has depressive symptoms that have developed and has a hard time motivating and a hard time keeping focused on life and staying engaged.  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  The patient expressed gratitude and had significant breakthrough during coaching session. 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  Partnering with psychology doctor to train supervisees and deliver coaching services through insurance.  Reports current difficulties with relationship dynamics. Friend brought dying dog that required euthanasia, triggering PTSD from previous loss of soulmate dog. Questioning friend's thought processes and decision-making patterns. Recognizing fundamental differences in how they process situations and make decisions. Concerned about lack of shared growth trajectory in relationship.  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 Some clients experiencing awakening to different paths forward while others know desired direction but face obstacles. Specializing in post-cancer survivors seeking to thrive, find joy, purpose, and resilience. Drawing from personal experience of struggling to find new normal after cancer treatment. Recognizing universal desire for these qualities but focusing on those who have faced mortality through cancer journey.  Reports interest in neuroscience behind human thinking patterns. Seeking to understand brain function and reprogramming capabilities. Listening to credible psychology podcasts and researching cited works. Wanting to incorporate neuroscience understanding into coaching practice to help clients shift from worry to possibility mindset.   Treatment Interventions:  Discussed neuroscience concepts and limitations of human thinking. Explored how humans compare current self to past self but struggle to imagine future self as different. Addressed tendency to teleport current self into future rather than envisioning growth and change. Discussed importance of avoiding absolute statements in coaching and scientific learning. Emphasized qualifying language and recognizing evolving nature of scientific understanding.  Used this request from the patient to apply the same strategies and components to her own life which she has been actively doing and  continues to make significant progress.  Participation Level:   Active  Participation Quality:  Appropriate and Attentive      Behavioral Observation:  Well Groomed, Alert, and Appropriate.   Current Psychosocial Factors: The patient reports that she has been actively working on some of the psychosocial stressors that have been present and is had significant improvements in those areas.  The patient is been trying to simplify some stressors in her life but also work on increasing activities as much as she can.  Reports continued work on sleeping patterns but still tends to stay up late into the evening but is trying to make sure she gets 7 or 8 hours of sleep each night.  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 Discussed relationship challenges and PTSD triggers. Explored neuroscience interests and learning goals. Provided guidance on finding appropriate client populations through brain injury and stroke organizations rather than medical providers. Discussed altruism, selfishness concepts, and time perspective in human behavior.  Effectiveness of Interventions: The patient was very active and open during the visit we had an effective appointment.  Target Goals:   Target goals include working on building coping skills and strategies around issues of depression and residual effects of her chemotherapy including residual cognitive deficits.  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  The patient has had residual cognitive changes similar to possible impacts of chemotherapy as well as significant residual pain symptoms.  The patient was  diagnosed and treated for cancer 10 years ago and continues to have ongoing follow-up regarding possible recurrence.  The patient has had neuropathy, insomnia and memory loss post chemotherapy.  The patient has depressive symptoms that have developed and has a hard time motivating and a hard time keeping focused on life and staying engaged.   The patient describes depressive symptoms, including motivation, feelings of helplessness and hopelessness and anxiety about recurrence of her cancer.  The patient also describes ongoing chronic pain symptoms and neuropathy as well as insomnia and memory loss.  Today we worked specifically on behavioral adaptations and building coping skills around issues of her difficulty completing necessary daily activities and daily requirements as her cognitive change in cognitive impairments and impact they have had on emotional status and self-esteem issues are having a very significant deleterious effect on her.  The patient is continued with symptoms and continues to be unable to work as her cognitive, stress, pain symptoms all leave her unable to perform full-time gainful employment.  The patient has a long history of significant avoidance behaviors particular on completing complex paperwork.  This is part of her overall condition and is not simply the patient not completing aspects that she should.  The patient's avoidance is associated with anxiety and is part of her debilitating symptoms.  The patient has a number of symptoms that have an overlap in their symptom spectrum.  These include residual PTSD-like symptoms, depression, significant pain and possible complex regional pain syndrome symptoms etc.  Reviewing her overall symptoms and status I do think that she may be a very good candidate for ketamine infusion in the future.  I have asked the patient to speak with her oncologist as these types of treatments are being done through oncology in the [facility] network.  If it is  not  something that she could get to the [facility] network they are also providing the services through [facility] the [employer] as well.  This may take some time as getting insurance coverage may require active efforts but we will continue to look at this possibility going forward.  Continued working on on coping with memory issues, depression and PTSD.       Diagnosis:   Major depressive disorder, recurrent episode, moderate (HCC)  Neurocognitive deficits  Post traumatic stress disorder (PTSD)  Sleep disturbance    Norleen Asa, Psy.D. Clinical Psychologist Neuropsychologist

## 2024-02-24 DIAGNOSIS — R519 Headache, unspecified: Secondary | ICD-10-CM | POA: Diagnosis not present

## 2024-02-24 DIAGNOSIS — M9902 Segmental and somatic dysfunction of thoracic region: Secondary | ICD-10-CM | POA: Diagnosis not present

## 2024-02-24 DIAGNOSIS — S29012A Strain of muscle and tendon of back wall of thorax, initial encounter: Secondary | ICD-10-CM | POA: Diagnosis not present

## 2024-02-24 DIAGNOSIS — M9901 Segmental and somatic dysfunction of cervical region: Secondary | ICD-10-CM | POA: Diagnosis not present

## 2024-03-01 ENCOUNTER — Inpatient Hospital Stay

## 2024-03-09 ENCOUNTER — Other Ambulatory Visit: Payer: Self-pay | Admitting: Neurology

## 2024-03-09 NOTE — Telephone Encounter (Signed)
 Pt called to request medication refill  amphetamine -dextroamphetamine  (ADDERALL XR) 30 MG 24 hr capsule   Pt medication is to be sent to   [id number] [place], [place] - [address] AT [facility] [address] (Ph: [phone])

## 2024-03-10 DIAGNOSIS — M9902 Segmental and somatic dysfunction of thoracic region: Secondary | ICD-10-CM | POA: Diagnosis not present

## 2024-03-10 DIAGNOSIS — S29012A Strain of muscle and tendon of back wall of thorax, initial encounter: Secondary | ICD-10-CM | POA: Diagnosis not present

## 2024-03-10 DIAGNOSIS — M9901 Segmental and somatic dysfunction of cervical region: Secondary | ICD-10-CM | POA: Diagnosis not present

## 2024-03-10 DIAGNOSIS — R519 Headache, unspecified: Secondary | ICD-10-CM | POA: Diagnosis not present

## 2024-03-10 MED ORDER — AMPHETAMINE-DEXTROAMPHET ER 30 MG PO CP24: 30.0000 mg | ORAL_CAPSULE | Freq: Every day | ORAL | 0 refills | Status: DC

## 2024-03-10 NOTE — Telephone Encounter (Signed)
 Last visit: 09/24/23  Next visit: 04/27/24  last fill:

## 2024-03-22 ENCOUNTER — Encounter: Payer: Medicare Other | Attending: Psychology | Admitting: Psychology

## 2024-03-22 DIAGNOSIS — F331 Major depressive disorder, recurrent, moderate: Secondary | ICD-10-CM | POA: Insufficient documentation

## 2024-03-22 DIAGNOSIS — G479 Sleep disorder, unspecified: Secondary | ICD-10-CM | POA: Insufficient documentation

## 2024-03-22 DIAGNOSIS — R29818 Other symptoms and signs involving the nervous system: Secondary | ICD-10-CM | POA: Insufficient documentation

## 2024-03-22 DIAGNOSIS — F4310 Post-traumatic stress disorder, unspecified: Secondary | ICD-10-CM | POA: Insufficient documentation

## 2024-03-22 DIAGNOSIS — R4189 Other symptoms and signs involving cognitive functions and awareness: Secondary | ICD-10-CM | POA: Insufficient documentation

## 2024-03-25 ENCOUNTER — Ambulatory Visit: Payer: Medicare Other | Admitting: Neurology

## 2024-03-25 ENCOUNTER — Encounter: Payer: Self-pay | Admitting: Neurology

## 2024-03-25 VITALS — BP 139/97 | HR 72 | Ht 67.0 in | Wt 188.0 lb

## 2024-03-25 DIAGNOSIS — R4189 Other symptoms and signs involving cognitive functions and awareness: Secondary | ICD-10-CM | POA: Diagnosis not present

## 2024-03-25 DIAGNOSIS — G622 Polyneuropathy due to other toxic agents: Secondary | ICD-10-CM

## 2024-03-25 DIAGNOSIS — G43001 Migraine without aura, not intractable, with status migrainosus: Secondary | ICD-10-CM | POA: Diagnosis not present

## 2024-03-25 DIAGNOSIS — F4323 Adjustment disorder with mixed anxiety and depressed mood: Secondary | ICD-10-CM | POA: Diagnosis not present

## 2024-03-25 DIAGNOSIS — M542 Cervicalgia: Secondary | ICD-10-CM

## 2024-03-25 MED ORDER — ZOLPIDEM TARTRATE 5 MG PO TABS: ORAL_TABLET | ORAL | 0 refills | Status: AC

## 2024-03-25 MED ORDER — BUTALBITAL-ASPIRIN-CAFFEINE 50-325-40 MG PO CAPS: 1.0000 | ORAL_CAPSULE | Freq: Two times a day (BID) | ORAL | 0 refills | Status: AC | PRN

## 2024-03-25 MED ORDER — AMPHETAMINE-DEXTROAMPHET ER 30 MG PO CP24: 30.0000 mg | ORAL_CAPSULE | Freq: Every day | ORAL | 0 refills | Status: DC

## 2024-03-25 NOTE — Patient Instructions (Signed)
 Insomnia Insomnia is a sleep disorder that makes it difficult to fall asleep or stay asleep. Insomnia can cause fatigue, low energy, difficulty concentrating, mood swings, and poor performance at work or school. There are three different ways to classify insomnia: Difficulty falling asleep. Difficulty staying asleep. Waking up too early in the morning. Any type of insomnia can be long-term (chronic) or short-term (acute). Both are common. Short-term insomnia usually lasts for 3 months or less. Chronic insomnia occurs at least three times a week for longer than 3 months. What are the causes? Insomnia may be caused by another condition, situation, or substance, such as: Having certain mental health conditions, such as anxiety and depression. Using caffeine , alcohol, tobacco, or drugs. Having gastrointestinal conditions, such as gastroesophageal reflux disease (GERD). Having certain medical conditions. These include: Asthma. Alzheimer's disease. Stroke. Chronic pain. An overactive thyroid  gland (hyperthyroidism). Other sleep disorders, such as restless legs syndrome and sleep apnea. Menopause. Sometimes, the cause of insomnia may not be known. What increases the risk? Risk factors for insomnia include: Gender. Females are affected more often than males. Age. Insomnia is more common as people get older. Stress and certain medical and mental health conditions. Lack of exercise. Having an irregular work schedule. This may include working night shifts and traveling between different time zones. What are the signs or symptoms? If you have insomnia, the main symptom is having trouble falling asleep or having trouble staying asleep. This may lead to other symptoms, such as: Feeling tired or having low energy. Feeling nervous about going to sleep. Not feeling rested in the morning. Having trouble concentrating. Feeling irritable, anxious, or depressed. How is this diagnosed? This condition  may be diagnosed based on: Your symptoms and medical history. Your health care provider may ask about: Your sleep habits. Any medical conditions you have. Your mental health. A physical exam. How is this treated? Treatment for insomnia depends on the cause. Treatment may focus on treating an underlying condition that is causing the insomnia. Treatment may also include: Medicines to help you sleep. Counseling or therapy. Lifestyle adjustments to help you sleep better. COGNITIVE SHUFFLING - go online for further instruction.  Follow these instructions at home: Eating and drinking  Limit or avoid alcohol, caffeinated beverages, and products that contain nicotine and tobacco, especially close to bedtime. These can disrupt your sleep. Do not eat a large meal or eat spicy foods right before bedtime. This can lead to digestive discomfort that can make it hard for you to sleep. Sleep habits  Keep a sleep diary to help you and your health care provider figure out what could be causing your insomnia. Write down: When you sleep. When you wake up during the night. How well you sleep and how rested you feel the next day. Any side effects of medicines you are taking. What you eat and drink. Make your bedroom a dark, comfortable place where it is easy to fall asleep. Put up shades or blackout curtains to block light from outside. Use a white noise machine to block noise. Keep the temperature cool. Limit screen use before bedtime. This includes: Not watching TV. Not using your smartphone, tablet, or computer. Stick to a routine that includes going to bed and waking up at the same times every day and night. This can help you fall asleep faster. Consider making a quiet activity, such as reading, part of your nighttime routine. Try to avoid taking naps during the day so that you sleep better at night. Get  out of bed if you are still awake after 15 minutes of trying to sleep. Keep the lights down, but  try reading or doing a quiet activity. When you feel sleepy, go back to bed. General instructions Take over-the-counter and prescription medicines only as told by your health care provider. Exercise regularly as told by your health care provider. However, avoid exercising in the hours right before bedtime. Use relaxation techniques to manage stress. Ask your health care provider to suggest some techniques that may work well for you. These may include: Breathing exercises. Routines to release muscle tension. Visualizing peaceful scenes. Make sure that you drive carefully. Do not drive if you feel very sleepy. Keep all follow-up visits. This is important. Contact a health care provider if: You are tired throughout the day. You have trouble in your daily routine due to sleepiness. You continue to have sleep problems, or your sleep problems get worse. Get help right away if: You have thoughts about hurting yourself or someone else. Get help right away if you feel like you may hurt yourself or others, or have thoughts about taking your own life. Go to your nearest emergency room or: Call 911. Call the National Suicide Prevention Lifeline at (445)119-9091 or 988. This is open 24 hours a day. Text the Crisis Text Line at 587-654-9664. Summary Insomnia is a sleep disorder that makes it difficult to fall asleep or stay asleep. Insomnia can be long-term (chronic) or short-term (acute). Treatment for insomnia depends on the cause. Treatment may focus on treating an underlying condition that is causing the insomnia. Keep a sleep diary to help you and your health care provider figure out what could be causing your insomnia. This information is not intended to replace advice given to you by your health care provider. Make sure you discuss any questions you have with your health care provider. Document Revised: 04/09/2021 Document Reviewed: 04/09/2021 Elsevier Patient Education  2024 [employer].

## 2024-03-25 NOTE — Progress Notes (Signed)
 Provider:  Dedra Gores, MD  Primary Care Physician:  Charlett Apolinar POUR, MD [address] [place] [address]     Referring Provider: Charlett Apolinar POUR, Md [address] [place],  [place] [address]          Chief Complaint according to patient   Patient presents with:                HISTORY OF PRESENT ILLNESS:  [name] is a 63 y.o. female patient who is here for revisit 03/25/2024 for polyneuropathy R. Has reported a history of migraines  that improved with menopause to once a months,  but last 2 months  ago  she has woken up with a sensation of tightness in jaw and  eye pain, photophobia. ,  but not temple pain and no palpable temporal artery.  She took butalbutal/ Fioricet with some success.   Her orthopedist has ordered a neck cervical MRI and she has DDD,DSD  Reviewed MRI brain and MRI C spine, has sinus congestion.   Chief concern according to patient :  new kind of headache-       Fam Hx : see previous note  Social HX; see previous note       Review of Systems: Out of a complete 14 system review, the patient complains of only the following symptoms, and all other reviewed systems are negative.:   SLEEPINESS ?  How likely are you to doze in the following situations: 0 = not likely, 1 = slight chance, 2 = moderate chance, 3 = high chance  Sitting and Reading? Watching Television? Sitting inactive in a public place (theater or meeting)? Lying down in the afternoon when circumstances permit? Sitting and talking to someone? Sitting quietly after lunch without alcohol? In a car, while stopped for a few minutes in traffic? As a passenger in a car for an hour without a break?  Total =        Social History   Socioeconomic History   Marital status: Divorced    Spouse name: Not on file   Number of children: 1   Years of education: Master's   Highest education level: Not on file  Occupational History    Employer:  UNEMPLOYED    Comment: disabled  Tobacco Use   Smoking status: Every Day    Current packs/day: 1.00    Average packs/day: 1 pack/day for 45.8 years (45.8 ttl pk-yrs)    Types: Cigarettes    Start date: 06/16/1978   Smokeless tobacco: Never   Tobacco comments:    2- 7-16  STILL SMOKING    03/25/2024 a little less than 1/2 ppd  Vaping Use   Vaping status: Never Used  Substance and Sexual Activity   Alcohol use: Yes    Comment: rare   Drug use: No   Sexual activity: Not Currently  Other Topics Concern   Not on file  Social History Narrative   Divorced   1 son /1 adopted adult son  2 dogs    Sales occupation on disability for now   Regular exercise-yes money stress   Daily caffeine  use 2-3 and 2 cups of coffee   On since a security disability since 2012   Patient is right-handed.   Patient has a Master's degree.      Update 03/25/2024   1 cup/day of coffee   Social Drivers of Corporate Investment Banker Strain: Low  Risk  (05/21/2023)   Overall Financial Resource Strain (CARDIA)    Difficulty of Paying Living Expenses: Not hard at all  Food Insecurity: No Food Insecurity (05/21/2023)   Hunger Vital Sign    Worried About Running Out of Food in the Last Year: Never true    Ran Out of Food in the Last Year: Never true  Transportation Needs: No Transportation Needs (05/21/2023)   PRAPARE - Administrator, Civil Service (Medical): No    Lack of Transportation (Non-Medical): No  Physical Activity: Sufficiently Active (05/21/2023)   Exercise Vital Sign    Days of Exercise per Week: 3 days    Minutes of Exercise per Session: 60 min  Stress: Stress Concern Present (05/21/2023)   [employer] of Occupational Health - Occupational Stress Questionnaire    Feeling of Stress : To some extent  Social Connections: Socially Isolated (05/21/2023)   Social Connection and Isolation Panel    Frequency of Communication with Friends and Family: More than three times a week    Frequency  of Social Gatherings with Friends and Family: More than three times a week    Attends Religious Services: Never    Database Administrator or Organizations: No    Attends Engineer, Structural: Never    Marital Status: Divorced    Family History  Problem Relation Age of Onset   Pulmonary embolism Mother    Hypertension Father    Breast cancer Maternal Grandmother        diagnosed in early 44s   Hyperlipidemia Other    Breast cancer Sister 85   Colon cancer Neg Hx    Esophageal cancer Neg Hx    Rectal cancer Neg Hx    Stomach cancer Neg Hx     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   Adjustment disorder    Allergic rhinitis    Allergy    Alopecia    Anxiety and depression    Arthritis    Arthritis    neck   Blood transfusion without reported diagnosis    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   Clotting disorder    GERD (gastroesophageal reflux disease)    HLD (hyperlipidemia)    Hx pulmonary embolism    Hypertension    Iron  deficiency anemia due to chronic blood loss 11/04/2016   Metrorrhagia    Neuromuscular disorder (HCC)    Neuropathy    Obesity    Ovarian cancer (HCC) 04/2008   ChemoTherapy   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   Raynaud's syndrome    Right groin mass    Squamous cell carcinoma of skin    Ulcerative colitis (HCC)    Vision abnormalities    Vitamin D  deficiency     Past Surgical History:  Procedure Laterality Date   ABDOMINAL HYSTERECTOMY  05/2008   TAH/BSO   AUGMENTATION MAMMAPLASTY Bilateral 10+ years   BREAST BIOPSY Left 2020   BREAST BIOPSY Right    BREAST REDUCTION SURGERY  2000   CESAREAN SECTION     1991   COLONOSCOPY  2004; 12/07/10   hemorrhoids   ESOPHAGOGASTRODUODENOSCOPY     GASTRIC BYPASS  1979   IR CV LINE INJECTION  03/13/2022   IR IMAGING GUIDED PORT INSERTION  03/07/2023   REDUCTION MAMMAPLASTY Bilateral    TOENAIL EXCISION Bilateral     TONSILLECTOMY       Current Outpatient Medications on  File Prior to Visit  Medication Sig Dispense Refill   amLODipine  (NORVASC ) 10 MG tablet TAKE 1 TABLET(10 MG) BY MOUTH DAILY 90 tablet 1   amphetamine -dextroamphetamine  (ADDERALL XR) 30 MG 24 hr capsule Take 1 capsule (30 mg total) by mouth daily. 30 capsule 0   aspirin 81 MG tablet Take 81 mg by mouth daily. 2 TABS IN AM     butalbital -acetaminophen -caffeine  (FIORICET) 50-325-40 MG tablet TAKE 1 TABLET BY MOUTH EVERY 6 HOURS AS NEEDED FOR HEADACHE 10 tablet 1   butalbital -aspirin-caffeine  (FIORINAL) 50-325-40 MG capsule Take 1 capsule by mouth 2 (two) times daily as needed for headache. 28 capsule 0   calcium  carbonate (TUMS - DOSED IN MG ELEMENTAL CALCIUM ) 500 MG chewable tablet Chew 1 tablet by mouth daily as needed for indigestion or heartburn.     donepezil  (ARICEPT ) 10 MG tablet TAKE 1 TABLET(10 MG) BY MOUTH EVERY MORNING 90 tablet 3   DULoxetine  (CYMBALTA ) 60 MG capsule Take 1 capsule (60 mg total) by mouth daily. 90 capsule 3   Multiple Vitamin (MULTIVITAMIN WITH MINERALS) TABS tablet Take 1 tablet by mouth daily.     orphenadrine  (NORFLEX ) 100 MG tablet TAKE ONE TABLET BY MOUTH TWICE DAILY AS NEEDED FOR MUSCLE SPASMS 180 tablet 0   pregabalin  (LYRICA ) 200 MG capsule Take 1 capsule (200 mg total) by mouth 2 (two) times daily. 180 capsule 1   rosuvastatin  (CRESTOR ) 5 MG tablet TAKE 1 TABLET(5 MG) BY MOUTH DAILY 90 tablet 3   spironolactone  (ALDACTONE ) 25 MG tablet TAKE 1 TABLET(25 MG) BY MOUTH DAILY 90 tablet 0   Vitamin D , Ergocalciferol , (DRISDOL ) 1.25 MG (50000 UNIT) CAPS capsule TAKE 1 CAPSULE BY MOUTH 1 TIME A WEEK 13 capsule 2   zolpidem  (AMBIEN ) 5 MG tablet TAKE 1 TO 2 TABLETS(5 TO 10 MG) BY MOUTH EVERY NIGHT AT BEDTIME AS NEEDED 180 tablet 1   tirzepatide  10 MG/0.5ML injection vial Inject 10 mg into the skin once a week. (Patient not taking: Reported on 03/25/2024) 2 mL 2   tirzepatide  10 MG/0.5ML injection vial Inject 10 mg  into the skin once a week. Disp one month supply (Patient not taking: Reported on 03/25/2024) 2 mL 2   Current Facility-Administered Medications on File Prior to Visit  Medication Dose Route Frequency Provider Last Rate Last Admin   heparin  lock flush 100 unit/mL  500 Units Intravenous Once Ennever, Maude SAUNDERS, MD       sodium chloride  flush (NS) 0.9 % injection 10 mL  10 mL Intravenous PRN Ennever, Maude SAUNDERS, MD        Allergies  Allergen Reactions   Codeine Nausea Only   Orange Itching and Rash     DIAGNOSTIC DATA (LABS, IMAGING, TESTING) - I reviewed patient records, labs, notes, testing and imaging myself where available.  Lab Results  Component Value Date   WBC 7.9 12/31/2023   HGB 13.4 12/31/2023   HCT 40.0 12/31/2023   MCV 87.1 12/31/2023   PLT 237 12/31/2023      Component Value Date/Time   NA 138 12/31/2023 1215   NA 147 (H) 04/16/2017 1152   NA 139 12/27/2015 1156   K 3.6 12/31/2023 1215   K 3.4 04/16/2017 1152   K 3.9 12/27/2015 1156   CL 101 12/31/2023 1215   CL 101 04/16/2017 1152   CO2 26 12/31/2023 1215   CO2 30 04/16/2017 1152   CO2 27 12/27/2015 1156   GLUCOSE 93 12/31/2023 1215   GLUCOSE 89 04/16/2017 1152  BUN 18 12/31/2023 1215   BUN 18 04/16/2017 1152   BUN 14.9 12/27/2015 1156   CREATININE 0.86 12/31/2023 1215   CREATININE 1.07 (H) 03/01/2020 1626   CREATININE 0.9 12/27/2015 1156   CALCIUM  9.6 12/31/2023 1215   CALCIUM  9.8 04/16/2017 1152   CALCIUM  9.7 12/27/2015 1156   PROT 6.7 12/31/2023 1215   PROT 6.5 04/16/2017 1152   PROT 6.8 12/27/2015 1156   ALBUMIN 4.1 12/31/2023 1215   ALBUMIN 3.6 04/16/2017 1152   ALBUMIN 4.0 07/08/2016 1432   ALBUMIN 3.5 12/27/2015 1156   AST 24 12/31/2023 1215   AST 29 12/27/2015 1156   ALT 25 12/31/2023 1215   ALT 26 04/16/2017 1152   ALT 28 12/27/2015 1156   ALKPHOS 92 12/31/2023 1215   ALKPHOS 87 (H) 04/16/2017 1152   ALKPHOS 106 12/27/2015 1156   BILITOT 0.4 12/31/2023 1215   BILITOT 0.33 12/27/2015  1156   GFRNONAA >60 12/31/2023 1215   GFRNONAA 57 (L) 03/01/2020 1626   GFRAA 66 03/01/2020 1626   Lab Results  Component Value Date   CHOL 135 08/19/2023   HDL 46.70 08/19/2023   LDLCALC 73 08/19/2023   LDLDIRECT 98.7 09/04/2009   TRIG 76.0 08/19/2023   CHOLHDL 3 08/19/2023   Lab Results  Component Value Date   HGBA1C 5.6 08/19/2023   Lab Results  Component Value Date   VITAMINB12 840 11/20/2021   Lab Results  Component Value Date   TSH 3.70 08/19/2023    PHYSICAL EXAM:  Vitals:   03/25/24 1334  BP: (!) 139/97  Pulse: 72   No data found. Body mass index is 29.44 kg/m.   Wt Readings from Last 3 Encounters:  03/25/24 188 lb (85.3 kg)  12/31/23 171 lb 0.6 oz (77.6 kg)  09/24/23 174 lb (78.9 kg)     Ht Readings from Last 3 Encounters:  03/25/24 5' 7 (1.702 m)  09/24/23 5' 7 (1.702 m)  08/19/23 5' 6.99 (1.702 m)      General: The patient is awake, alert and appears not in acute distress and groomed. Head: Normocephalic, atraumatic.  Neck is supple. Cardiovascular:  Regular rate and cardiac rhythm by pulse, without distended neck veins. Respiratory: no shortness of breath  Skin:  Without evidence of ankle edema, or rash. Trunk: BMI is 29.4    NEUROLOGIC EXAM: The patient is awake and alert, oriented to place and time.   Memory subjective described as intact.  Attention span & concentration ability appears normal.   Speech is fluent,  without  dysarthria, dysphonia or aphasia.  Mood and affect are appropriate.   Neurological Examination: Mental Status: Intact. Language and speech are normal. No cognitive deficits. Cranial Nerves II-XII: Intact. PERL. No nystagmus.   No facial droop.  No ptosis.  Hearing is grossly intact bilaterally.  The tongue is normal and midline. Motor: Strengths are 5/5 throughout.   Muscle bulk and tone are normal. No tremors.  Coordination: No ataxia or dysmetria.   Reflexes: Normal and symmetric throughout. No ankle  clonus.   Gait and Station: Normal. Romberg's sign is absent.   ASSESSMENT AND PLAN :   63 y.o. year old female  here with:    1) Migraine headaches with  photophobia, jaw cramping. 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    I ordered sed rate, CRP and  want patient to continue Lyrica  and continue your ASA regimen.  2) No recent falls, no syncope.   3)  neck  pain: cervical DDD/ D spine disease. On Cymbalta  , on Lyrica  , on  amphetamine   Treatment by orthopedist .  4) excessive daytime sleepiness and nocturnal insomnia - uses ambien  5 mg and adderall.  Refilled all meds from Neurology/ Patient can see a chiropractor as she wishes. I recommend  message therapy.   Rv virtual with NP in 6 months    I would like to thank Panosh, Wanda K, MD and Charlett Wanda K, Md [address] [place],  [place] [address] for allowing me to meet with this pleasant patient.     The patient's condition requires frequent monitoring and adjustments in the treatment plan, reflecting the ongoing complexity of care.  This provider is the continuing focal point for all needed services for this condition.  After spending a total time of  35  minutes face to face and time for  history taking, physical and neurologic examination, review of laboratory studies,  personal review of imaging studies, reports and results of other testing and review of referral information / records as far as provided in visit,   Electronically signed by: Dedra Gores, MD 03/25/2024 1:49 PM  [facility] Board certified by The [employer] of Sleep Medicine and Diplomate of the [employer] of Sleep Medicine. Board certified In Neurology through the ABPN, Fellow of the [employer] of Neurology.

## 2024-04-02 ENCOUNTER — Other Ambulatory Visit: Payer: Self-pay | Admitting: Neurology

## 2024-04-02 DIAGNOSIS — C801 Malignant (primary) neoplasm, unspecified: Secondary | ICD-10-CM

## 2024-04-02 DIAGNOSIS — F4323 Adjustment disorder with mixed anxiety and depressed mood: Secondary | ICD-10-CM

## 2024-04-02 MED ORDER — PREGABALIN 200 MG PO CAPS: 200.0000 mg | ORAL_CAPSULE | Freq: Two times a day (BID) | ORAL | 1 refills | Status: AC

## 2024-04-02 NOTE — Telephone Encounter (Signed)
 Dr. Buck- you are on call. Dr. Chalice left. Can you send in refill for pt? Rx ready to e-scribe below if you approve.   Last saw Dr. Chalice 03/25/24. Next f/u 10/26/24. Last refill sent 09/24/23 #180, 1 refill. Due for refill.

## 2024-04-02 NOTE — Telephone Encounter (Signed)
 Pt called stating that she is in a lot of pain and is completely out of her pregabalin  (LYRICA ) 200 MG capsule. She is needing a refill sent to the [facility] on [address]

## 2024-04-09 ENCOUNTER — Other Ambulatory Visit: Payer: Self-pay | Admitting: Neurology

## 2024-04-09 DIAGNOSIS — C801 Malignant (primary) neoplasm, unspecified: Secondary | ICD-10-CM

## 2024-04-19 ENCOUNTER — Encounter: Attending: Psychology | Admitting: Psychology

## 2024-04-19 ENCOUNTER — Encounter: Payer: Self-pay | Admitting: Psychology

## 2024-04-19 DIAGNOSIS — R4189 Other symptoms and signs involving cognitive functions and awareness: Secondary | ICD-10-CM | POA: Insufficient documentation

## 2024-04-19 DIAGNOSIS — F4310 Post-traumatic stress disorder, unspecified: Secondary | ICD-10-CM | POA: Diagnosis not present

## 2024-04-19 DIAGNOSIS — F331 Major depressive disorder, recurrent, moderate: Secondary | ICD-10-CM | POA: Diagnosis not present

## 2024-04-19 DIAGNOSIS — G479 Sleep disorder, unspecified: Secondary | ICD-10-CM | POA: Diagnosis present

## 2024-04-19 DIAGNOSIS — R29818 Other symptoms and signs involving the nervous system: Secondary | ICD-10-CM | POA: Diagnosis not present

## 2024-04-19 NOTE — Progress Notes (Signed)
 Neuropsychology Visit  Patient:  [name]   DOB: [birth year]  MR Number: [id number]  Location: [facility] [address] [place], [address] [place] [address] Dept: [phone]  Date of Service: 04/19/2024  Start: 2 PM End: 3:30 PM  Today's visit was an in person visit that was conducted in my outpatient clinic office.  The patient and myself were present for this visit.   Duration of Service: 1 Hour  Provider/Observer:     Norleen JONELLE Asa PsyD  Chief Complaint:      No chief complaint on file.   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  The patient has had residual cognitive changes similar to possible impacts of chemotherapy as well as significant residual pain symptoms.  The patient was diagnosed and treated for cancer 10 years ago and continues to have ongoing follow-up regarding possible recurrence.  The patient has had neuropathy, insomnia and memory loss post chemotherapy.  The patient has depressive symptoms that have developed and has a hard time motivating and a hard time keeping focused on life and staying engaged.  Reports currently struggling more than previously, with increased social withdrawal. Suspects seasonal depression. Tends to self-isolate for days if not required to leave the house. Financial strain is a major stressor due to a delay in processing private disability payments, which has led to a stoppage of income. This has exacerbated social isolation as she avoids spending money. Ceased one medication due to affordability issues, which has resulted in rapid weight gain. Had been  experiencing debilitating migraines, occurring as frequently as five times in eight days, managed with steroids. Notes an increase in migraines from a few per year to almost daily. X-rays and an MRI of the neck were performed. The MRI report noted incidental findings of brain bleeds which were not of significant concern to Dr. Gores. The combination of steroid use, cessation of appetite-curbing medication, and stress has led to significant hunger and eating, including nocturnal eating. Additional stressors include family worries, reduced sunlight exposure, and cessation of walking her dog due to knee pain. Also reports exercise-induced rhinitis, which began occurring without exercise starting 03/16/2024 and has only recently started to improve. This has prevented her from working out. The rhinitis attacks became severe, triggered by allergens like pets and environmental factors, leading to severe sneezing, pelvic floor issues, and pain. Reports Raynaud's symptoms have worsened significantly.   Reports recent significant psychosocial stress related to interpersonal relationships.  This has caused considerable distress and drama, which she finds draining. The conflict was exacerbated by others' actions, including misrepresenting conversations and creating friction between the patient and other relationships. States she does not desire a committed relationship with either individual and has been clear about her relationship boundaries.   Treatment Interventions:  Focus of the session was on psychosocial stressors and coping strategies. Discussed the concept of chunking as a cognitive strategy to manage feeling overwhelmed by multiple stressors (financial, health, relational). This involves grouping various problems into a single mental category to allow for functional engagement in daily life, while acknowledging the risk that this can make the problems feel unsolvable. Explored un-chunking problems to  address them individually.  The concept of forgiveness was discussed, defined as no longer seeking revenge, repayment, or retribution, as a tool for personal emotional release, differentiating it from forgetting. Practical advice was given regarding home network security to prevent potential intrusions and reduce anxiety about being monitored, including creating a guest Wi-Fi network. Also provided education on the clinical implications of her MRI findings (microhemorrhages likely due to blood pressure spikes) and reinforced the importance of avoiding cervical chiropractic manipulation.  Participation Level:   Active  Participation Quality:  Appropriate and Attentive      Behavioral Observation:  Well Groomed, Alert, and Appropriate.   Current Psychosocial Factors: Reports significant current stressors. These include financial instability due to interrupted disability payments, recent significant weight gain, multiple physical health issues (knee pain, exercise-induced rhinitis, arthritis, migraines, Raynaud's), and interpersonal conflict. The relationship conflict has been a source of significant drama and emotional distress. Reports social withdrawal and self-isolation as a coping mechanism for financial and emotional stress. Worries about family, including a brother recently out of rehab, contribute to overall stress.  Content of Session:   Reviewed recent increase in struggles, including withdrawal and potential seasonal depression. Explored multiple compounding stressors: financial difficulties, health problems (migraines, neck arthritis, rhinitis, weight gain), and significant interpersonal conflict with two partners. A large portion of the session was dedicated to processing the relational dynamics, the ensuing conflict, and her feelings about it. Therapeutic intervention included psychoeducation on cognitive and emotional coping mechanisms (chunking, forgiveness) and practical problem-solving for  security concerns.  Effectiveness of Interventions: Engaged well with psychoeducational concepts. Reported finding the session helpful for consolidating and verbalizing the multiple stressors she is facing, stating it was the first time she had discussed all the issues together. Appeared to gain insight into the relational dynamics and expressed appreciation for the practical advice provided.  Target Goals:   Goals include managing symptoms and increasing independence. Currently struggling with the disability application process, which is a major source of stress. 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  The patient has had residual cognitive changes similar to possible impacts of chemotherapy as well as significant residual pain symptoms.  The patient was diagnosed and treated for cancer 10 years ago and continues to have ongoing follow-up regarding possible recurrence.  The patient has had neuropathy, insomnia and memory loss post chemotherapy.  The patient has depressive symptoms that have developed and has a hard time motivating and a hard time keeping focused on life and staying engaged.   The patient describes depressive symptoms, including motivation, feelings of helplessness and hopelessness and anxiety about recurrence of her cancer.  The patient also describes ongoing chronic pain symptoms and neuropathy as well as insomnia and memory loss.  Today we worked specifically on behavioral adaptations and building coping skills around issues of her difficulty completing necessary daily activities and daily  requirements as her cognitive change in cognitive impairments and impact they have had on emotional status and self-esteem issues are having a very significant deleterious effect on her.  The patient is continued with symptoms and continues to be unable to work as her cognitive, stress, pain  symptoms all leave her unable to perform full-time gainful employment.  The patient has a long history of significant avoidance behaviors particular on completing complex paperwork.  This is part of her overall condition and is not simply the patient not completing aspects that she should.  The patient's avoidance is associated with anxiety and is part of her debilitating symptoms.  The patient has a number of symptoms that have an overlap in their symptom spectrum.  These include residual PTSD-like symptoms, depression, significant pain and possible complex regional pain syndrome symptoms etc.  Reviewing her overall symptoms and status I do think that she may be a very good candidate for ketamine infusion in the future.  I have asked the patient to speak with her oncologist as these types of treatments are being done through oncology in the [facility] network.  If it is not something that she could get to the [facility] network they are also providing the services through [facility] at the [employer] as well.  This may take some time as getting insurance coverage may require active efforts but we will continue to look at this possibility going forward.  Continued working on on coping with memory issues, depression and PTSD.       Diagnosis:   Major depressive disorder, recurrent episode, moderate (HCC)  Neurocognitive deficits  Post traumatic stress disorder (PTSD)  Sleep disturbance    Norleen Asa, Psy.D. Clinical Psychologist Neuropsychologist

## 2024-04-27 ENCOUNTER — Telehealth: Admitting: Family Medicine

## 2024-05-03 ENCOUNTER — Inpatient Hospital Stay: Attending: Hematology & Oncology

## 2024-05-05 ENCOUNTER — Telehealth: Admitting: Physician Assistant

## 2024-05-05 DIAGNOSIS — J101 Influenza due to other identified influenza virus with other respiratory manifestations: Secondary | ICD-10-CM

## 2024-05-05 MED ORDER — OSELTAMIVIR PHOSPHATE 75 MG PO CAPS: 75.0000 mg | ORAL_CAPSULE | Freq: Two times a day (BID) | ORAL | 0 refills | Status: AC

## 2024-05-05 MED ORDER — NAPROXEN 500 MG PO TABS: 500.0000 mg | ORAL_TABLET | Freq: Two times a day (BID) | ORAL | 0 refills | Status: AC

## 2024-05-05 NOTE — Progress Notes (Signed)
 E visit for Flu like symptoms   We are sorry that you are not feeling well.  Here is how we plan to help! Based on what you have shared with me it looks like you may have a respiratory virus that may be influenza.  Influenza or the flu is  an infection caused by a respiratory virus. The flu virus is highly contagious and persons who did not receive their yearly flu vaccination may catch the flu from close contact.  We have anti-viral medications to treat the viruses that cause this infection. They are not a cure and only shorten the course of the infection. These prescriptions are most effective when they are given within the first 2 days of flu symptoms. Antiviral medications are indicated if you have a high risk of complications from the flu. You should  also consider an antiviral medication if you are in close contact with someone who is at risk. These medications can help patients avoid complications from the flu but have side effects that you should know.   Possible side effects from Tamiflu  or oseltamivir  include nausea, vomiting, diarrhea, dizziness, headaches, eye redness, sleep problems or other respiratory symptoms. You should not take Tamiflu  if you have an allergy to oseltamivir  or any to the ingredients in Tamiflu .  Based upon your symptoms and potential risk factors I have prescribed Oseltamivir  (Tamiflu ).  It has been sent to your designated pharmacy.  You will take one 75 mg capsule orally twice a day for the next 5 days.   For nasal congestion, you may use an oral decongestant such as Mucinex D or if you have glaucoma or high blood pressure use plain Mucinex.  Saline nasal spray or nasal drops can help and can safely be used as often as needed for congestion.  If you have a sore or scratchy throat, use a saltwater gargle-  to  teaspoon of salt dissolved in a 4-ounce to 8-ounce glass of warm water.  Gargle the solution for approximately 15-30 seconds and then spit.  It is  important not to swallow the solution.  You can also use throat lozenges/cough drops and Chloraseptic spray to help with throat pain or discomfort.  Warm or cold liquids can also be helpful in relieving throat pain.  For headache, pain or general discomfort, you can use Ibuprofen or Tylenol as directed.   Some authorities believe that zinc sprays or the use of Echinacea may shorten the course of your symptoms.  I have prescribed the following medications to help lessen symptoms: I have prescribed an anti-inflammatory - Naprosyn  500 mg. Take twice daily as needed for fever or body aches for 2 weeks  You are to isolate at home until you have been fever-free for at least 24 hours without a fever-reducing medication, and symptoms have been steadily improving for 24 hours.  If you must be around other household members who do not have symptoms, you need to make sure that both you and the family members are masking consistently with a high-quality mask.  If you note any worsening of symptoms despite treatment, please seek an in-person evaluation ASAP. If you note any significant shortness of breath or any chest pain, please seek ED evaluation. Please do not delay care!  ANYONE WHO HAS FLU SYMPTOMS SHOULD: Stay home. The flu is highly contagious and going out or to work exposes others! Be sure to drink plenty of fluids. Water is fine as well as fruit juices, sodas and electrolyte beverages. You may  want to stay away from caffeine or alcohol. If you are nauseated, try taking small sips of liquids. How do you know if you are getting enough fluid? Your urine should be a pale yellow or almost colorless. Get rest. Taking a steamy shower or using a humidifier may help nasal congestion and ease sore throat pain. Using a saline nasal spray works much the same way. Cough drops, hard candies and sore throat lozenges may ease your cough. Line up a caregiver. Have someone check on you regularly.  GET HELP RIGHT  AWAY IF: You cannot keep down liquids or your medications. You become short of breath Your fell like you are going to pass out or loose consciousness. Your symptoms persist after you have completed your treatment plan  MAKE SURE YOU  Understand these instructions. Will watch your condition. Will get help right away if you are not doing well or get worse.  Your e-visit answers were reviewed by a board certified advanced clinical practitioner to complete your personal care plan.  Depending on the condition, your plan could have included both over the counter or prescription medications.  If there is a problem please reply  once you have received a response from your provider.  Your safety is important to us .  If you have drug allergies check your prescription carefully.    You can use MyChart to ask questions about todays visit, request a non-urgent call back, or ask for a work or school excuse for 24 hours related to this e-Visit. If it has been greater than 24 hours you will need to follow up with your provider, or enter a new e-Visit to address those concerns.  You will get an e-mail in the next two days asking about your experience.  I hope that your e-visit has been valuable and will speed your recovery. Thank you for using e-visits.   I have spent 5 minutes in review of e-visit questionnaire, review and updating patient chart, medical decision making and response to patient.   Delon CHRISTELLA Dickinson, PA-C

## 2024-05-25 ENCOUNTER — Ambulatory Visit: Admitting: Psychology

## 2024-06-02 ENCOUNTER — Ambulatory Visit

## 2024-06-02 VITALS — BP 120/62 | HR 64 | Temp 98.3°F | Ht 67.0 in | Wt 191.2 lb

## 2024-06-02 DIAGNOSIS — Z0000 Encounter for general adult medical examination without abnormal findings: Secondary | ICD-10-CM | POA: Diagnosis not present

## 2024-06-02 NOTE — Progress Notes (Signed)
 "  No chief complaint on file.    Subjective:   [name] is a 64 y.o. female who presents for a Medicare Annual Wellness Visit.  Visit info / Clinical Intake: Medicare Wellness Visit Type:: Subsequent Annual Wellness Visit Persons participating in visit and providing information:: patient Medicare Wellness Visit Mode:: In-person (required for WTM) Interpreter Needed?: No Pre-visit prep was completed: yes AWV questionnaire completed by patient prior to visit?: no Living arrangements:: (!) lives alone Patient's Overall Health Status Rating: good Typical amount of pain: some Does pain affect daily life?: no (Followed by medical attention) Are you currently prescribed opioids?: no  Dietary Habits and Nutritional Risks How many meals a day?: 2 Eats fruit and vegetables daily?: yes Most meals are obtained by: preparing own meals In the last 2 weeks, have you had any of the following?: none Diabetic:: no  Functional Status Activities of Daily Living (to include ambulation/medication): Independent Ambulation: Independent with device- listed below Home Assistive Devices/Equipment: Eyeglasses; Contact lenses Medication Administration: Independent Home Management (perform basic housework or laundry): Independent Manage your own finances?: (!) no (Aide assist) Primary transportation is: driving Concerns about vision?: no *vision screening is required for WTM* Concerns about hearing?: no  Fall Screening Falls in the past year?: 1 Number of falls in past year: 1 Was there an injury with Fall?: 0 Fall Risk Category Calculator: 2 Patient Fall Risk Level: Moderate Fall Risk  Fall Risk Patient at Risk for Falls Due to: Impaired balance/gait Fall risk Follow up: Falls evaluation completed  Home and Transportation Safety: All rugs have non-skid backing?: yes All stairs or steps have railings?: (!) no Grab bars in the bathtub or shower?: (!) no Have non-skid surface in  bathtub or shower?: (!) no Good home lighting?: yes Regular seat belt use?: yes Hospital stays in the last year:: no  Cognitive Assessment Difficulty concentrating, remembering, or making decisions? : yes Will 6CIT or Mini Cog be Completed: yes What year is it?: 0 points What month is it?: 0 points Give patient an address phrase to remember (5 components): [address] [place]  About what time is it?: 0 points Count backwards from 20 to 1: 0 points Say the months of the year in reverse: 0 points Repeat the address phrase from earlier: 0 points 6 CIT Score: 0 points  Advance Directives (For Healthcare) Does Patient Have a Medical Advance Directive?: Yes Does patient want to make changes to medical advance directive?: No - Patient declined Type of Advance Directive: Healthcare Power of [place]; Living will Copy of Healthcare Power of Attorney in Chart?: No - copy requested Copy of Living Will in Chart?: No - copy requested Would patient like information on creating a medical advance directive?: No - Patient declined  Reviewed/Updated  Reviewed/Updated: Reviewed All (Medical, Surgical, Family, Medications, Allergies, Care Teams, Patient Goals)    Allergies (verified) Codeine and Orange   Current Medications (verified) Outpatient Encounter Medications as of 06/02/2024  Medication Sig   tirzepatide  10 MG/0.5ML injection vial Inject 10 mg into the skin once a week.   tirzepatide  10 MG/0.5ML injection vial Inject 10 mg into the skin once a week. Disp one month supply   amLODipine  (NORVASC ) 10 MG tablet TAKE 1 TABLET(10 MG) BY MOUTH DAILY   amphetamine -dextroamphetamine  (ADDERALL XR) 30 MG 24 hr capsule Take 1 capsule (30 mg total) by mouth daily.   aspirin  81 MG tablet Take 81 mg by mouth daily. 2 TABS IN AM   butalbital -acetaminophen -caffeine  (FIORICET) 50-325-40  MG tablet TAKE 1 TABLET BY MOUTH EVERY 6 HOURS AS NEEDED FOR HEADACHE   butalbital -aspirin -caffeine  (FIORINAL )  50-325-40 MG capsule Take 1 capsule by mouth 2 (two) times daily as needed for headache.   calcium  carbonate (TUMS - DOSED IN MG ELEMENTAL CALCIUM ) 500 MG chewable tablet Chew 1 tablet by mouth daily as needed for indigestion or heartburn.   donepezil  (ARICEPT ) 10 MG tablet TAKE 1 TABLET(10 MG) BY MOUTH EVERY MORNING   DULoxetine  (CYMBALTA ) 60 MG capsule TAKE 1 CAPSULE(60 MG) BY MOUTH DAILY   Multiple Vitamin (MULTIVITAMIN WITH MINERALS) TABS tablet Take 1 tablet by mouth daily.   naproxen  (NAPROSYN ) 500 MG tablet Take 1 tablet (500 mg total) by mouth 2 (two) times daily with a meal.   orphenadrine  (NORFLEX ) 100 MG tablet TAKE ONE TABLET BY MOUTH TWICE DAILY AS NEEDED FOR MUSCLE SPASMS   oseltamivir  (TAMIFLU ) 75 MG capsule Take 1 capsule (75 mg total) by mouth 2 (two) times daily. (Patient not taking: Reported on 06/02/2024)   pregabalin  (LYRICA ) 200 MG capsule Take 1 capsule (200 mg total) by mouth 2 (two) times daily.   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   Adjustment disorder    Allergic rhinitis    Allergy    Alopecia    Anxiety and depression    Arthritis    Arthritis    neck   Blood transfusion without reported diagnosis    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   Clotting disorder    GERD (gastroesophageal reflux disease)    HLD (hyperlipidemia)    Hx pulmonary embolism    Hypertension     Iron  deficiency anemia due to chronic blood loss 11/04/2016   Metrorrhagia    Neuromuscular disorder (HCC)    Neuropathy    Obesity    Ovarian cancer (HCC) 04/2008   ChemoTherapy   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   Raynaud's syndrome    Right groin mass    Squamous cell carcinoma of skin    Ulcerative colitis (HCC)    Vision abnormalities    Vitamin D  deficiency    Past Surgical History:  Procedure Laterality Date   ABDOMINAL HYSTERECTOMY  05/2008   TAH/BSO   AUGMENTATION MAMMAPLASTY Bilateral 10+ years   BREAST BIOPSY Left 2020   BREAST BIOPSY Right    BREAST REDUCTION SURGERY  2000   CESAREAN SECTION     1991   COLONOSCOPY  2004; 12/07/10   hemorrhoids   ESOPHAGOGASTRODUODENOSCOPY     GASTRIC BYPASS  1979   IR CV LINE INJECTION  03/13/2022   IR IMAGING GUIDED PORT INSERTION  03/07/2023   REDUCTION MAMMAPLASTY Bilateral    TOENAIL EXCISION Bilateral    TONSILLECTOMY     Family History  Problem Relation Age of Onset   Pulmonary embolism Mother    Hypertension Father  Breast cancer Maternal Grandmother        diagnosed in early 30s   Hyperlipidemia Other    Breast cancer Sister 50   Colon cancer Neg Hx    Esophageal cancer Neg Hx    Rectal cancer Neg Hx    Stomach cancer Neg Hx    Social History   Occupational History    Employer: UNEMPLOYED    Comment: disabled  Tobacco Use   Smoking status: Every Day    Current packs/day: 1.00    Average packs/day: 1 pack/day for 46.0 years (46.0 ttl pk-yrs)    Types: Cigarettes    Start date: 06/16/1978   Smokeless tobacco: Never   Tobacco comments:    2- 7-16  STILL SMOKING    03/25/2024 a little less than 1/2 ppd  Vaping Use   Vaping status: Never Used  Substance and Sexual Activity   Alcohol use: Yes    Comment: rare   Drug use: No   Sexual activity: Not Currently   Tobacco Counseling Ready to quit: Yes Counseling given: Yes Tobacco comments: 2- 7-16  STILL SMOKING 03/25/2024 a little less  than 1/2 ppd  SDOH Screenings   Food Insecurity: No Food Insecurity (06/02/2024)  Housing: Unknown (06/02/2024)  Transportation Needs: No Transportation Needs (06/02/2024)  Utilities: Not At Risk (06/02/2024)  Alcohol Screen: Low Risk (05/21/2023)  Depression (PHQ2-9): Medium Risk (06/02/2024)  Financial Resource Strain: Low Risk (05/21/2023)  Physical Activity: Inactive (06/02/2024)  Social Connections: Moderately Isolated (06/02/2024)  Stress: Stress Concern Present (06/02/2024)  Tobacco Use: High Risk (06/02/2024)  Health Literacy: Adequate Health Literacy (06/02/2024)   See flowsheets for full screening details  Depression Screen PHQ 2 & 9 Depression Scale- Over the past 2 weeks, how often have you been bothered by any of the following problems? Little interest or pleasure in doing things: 1 Feeling down, depressed, or hopeless (PHQ Adolescent also includes...irritable): 1 PHQ-2 Total Score: 2 Trouble falling or staying asleep, or sleeping too much: 2 Feeling tired or having little energy: 2 Poor appetite or overeating (PHQ Adolescent also includes...weight loss): 0 Feeling bad about yourself - or that you are a failure or have let yourself or your family down: 1 Trouble concentrating on things, such as reading the newspaper or watching television (PHQ Adolescent also includes...like school work): 1 Moving or speaking so slowly that other people could have noticed. Or the opposite - being so fidgety or restless that you have been moving around a lot more than usual: 0 Thoughts that you would be better off dead, or of hurting yourself in some way: 0 PHQ-9 Total Score: 8 If you checked off any problems, how difficult have these problems made it for you to do your work, take care of things at home, or get along with other people?: Somewhat difficult  Depression Treatment Depression Interventions/Treatment : Medication; Currently on Treatment     Goals Addressed               This  Visit's Progress     Increase physical activity (pt-stated)        Get more active and lose weight! Stop smoking.             Objective:    Today's Vitals   06/02/24 1013  BP: 120/62  Pulse: 64  Temp: 98.3 F (36.8 C)  TempSrc: Oral  SpO2: 97%  Weight: 191 lb 3.2 oz (86.7 kg)  Height: 5' 7 (1.702 m)   Body mass index is  29.95 kg/m.  Hearing/Vision screen Hearing Screening - Comments:: Denies hearing difficulties   Vision Screening - Comments:: Wears rx glasses - up to date with routine eye exams with  [facility] Immunizations and Health Maintenance Health Maintenance  Topic Date Due   Zoster Vaccines- Shingrix (1 of 2) 08/14/1979   Influenza Vaccine  12/12/2023   COVID-19 Vaccine (8 - 2025-26 season) 01/12/2024   Lung Cancer Screening  02/03/2024   Mammogram  11/27/2024   Medicare Annual Wellness (AWV)  06/02/2025   DTaP/Tdap/Td (4 - Td or Tdap) 01/15/2030   Colonoscopy  03/20/2030   Pneumococcal Vaccine: 50+ Years  Completed   Hepatitis B Vaccines 19-59 Average Risk  Completed   HPV VACCINES (No Doses Required) Completed   Hepatitis C Screening  Completed   HIV Screening  Completed   Meningococcal B Vaccine  Aged Out        Assessment/Plan:  This is a routine wellness examination for [name].  Patient Care Team: Panosh, Apolinar POUR, MD as PCP - General (Internal Medicine) Avram Lupita BRAVO, MD (Gastroenterology) Lenon Rudolph BRAVO, PsyD (Inactive) (Psychiatry) Timmy Maude SAUNDERS, MD as Attending Physician (Internal Medicine) Dohmeier, Dedra, MD (Neurology) Cesario Boer, MD as Attending Physician (Physical Medicine and Rehabilitation) [place], Jon DEL, [facility] (Pharmacist)  I have personally reviewed and noted the following in the patients chart:   Medical and social history Use of alcohol, tobacco or illicit drugs  Current medications and supplements including opioid prescriptions. Functional ability and status Nutritional status Physical activity Advanced  directives List of other physicians Hospitalizations, surgeries, and ER visits in previous 12 months Vitals Screenings to include cognitive, depression, and falls Referrals and appointments  No orders of the defined types were placed in this encounter.  In addition, I have reviewed and discussed with patient certain preventive protocols, quality metrics, and best practice recommendations. A written personalized care plan for preventive services as well as general preventive health recommendations were provided to patient.   [name] LELON Blush, LPN   8/78/7973   Return in 53 weeks (on 06/08/2025).  After Visit Summary: (In Person-Printed) AVS printed and given to the patient  Nurse Notes: No voiced or noted concerns at this time "

## 2024-06-02 NOTE — Patient Instructions (Addendum)
 Ms. Rager,  Thank you for taking the time for your Medicare Wellness Visit. I appreciate your continued commitment to your health goals. Please review the care plan we discussed, and feel free to reach out if I can assist you further.  Please note that Annual Wellness Visits do not include a physical exam. Some assessments may be limited, especially if the visit was conducted virtually. If needed, we may recommend an in-person follow-up with your provider.  Ongoing Care Seeing your primary care provider every 3 to 6 months helps us  monitor your health and provide consistent, personalized care.   Referrals If a referral was made during today's visit and you haven't received any updates within two weeks, please contact the referred provider directly to check on the status.  Recommended Screenings:  Health Maintenance  Topic Date Due   Zoster (Shingles) Vaccine (1 of 2) 08/14/1979   Flu Shot  12/12/2023   COVID-19 Vaccine (8 - 2025-26 season) 01/12/2024   Screening for Lung Cancer  02/03/2024   Breast Cancer Screening  11/27/2024   Medicare Annual Wellness Visit  06/02/2025   DTaP/Tdap/Td vaccine (4 - Td or Tdap) 01/15/2030   Colon Cancer Screening  03/20/2030   Pneumococcal Vaccine for age over 85  Completed   Hepatitis B Vaccine  Completed   HPV Vaccine (No Doses Required) Completed   Hepatitis C Screening  Completed   HIV Screening  Completed   Meningitis B Vaccine  Aged Out       06/02/2024   10:27 AM  Advanced Directives  Does Patient Have a Medical Advance Directive? Yes  Type of Estate Agent of [place];Living will  Does patient want to make changes to medical advance directive? No - Patient declined  Copy of Healthcare Power of Attorney in Chart? No - copy requested    Vision: Annual vision screenings are recommended for early detection of glaucoma, cataracts, and diabetic retinopathy. These exams can also reveal signs of chronic conditions  such as diabetes and high blood pressure.  Dental: Annual dental screenings help detect early signs of oral cancer, gum disease, and other conditions linked to overall health, including heart disease and diabetes.  Please see the attached documents for additional preventive care recommendations.

## 2024-06-09 ENCOUNTER — Other Ambulatory Visit: Payer: Self-pay | Admitting: Neurology

## 2024-06-09 MED ORDER — AMPHETAMINE-DEXTROAMPHET ER 30 MG PO CP24: 30.0000 mg | ORAL_CAPSULE | Freq: Every day | ORAL | 0 refills | Status: AC

## 2024-06-09 NOTE — Telephone Encounter (Signed)
 Last seen 04/06/24, upcoming 10/26/24, last filled 04/11/25. Refill appropriate.

## 2024-06-09 NOTE — Telephone Encounter (Signed)
 Pt called requesting a refill on her amphetamine -dextroamphetamine  (ADDERALL XR) 30 MG 24 hr capsule and have it sent to the [facility] on [address] and [address]

## 2024-06-15 ENCOUNTER — Telehealth: Payer: Self-pay | Admitting: Neurology

## 2024-06-15 NOTE — Telephone Encounter (Signed)
 Pt called to request a letter be sent to  her Mychart  Pt need letter sent to My chart  to continue  getting Long term disability   Pt stated  the letter will need letter head

## 2024-06-30 ENCOUNTER — Encounter: Admitting: Psychology

## 2024-07-02 ENCOUNTER — Other Ambulatory Visit

## 2024-07-02 ENCOUNTER — Ambulatory Visit: Admitting: Hematology & Oncology

## 2024-07-29 ENCOUNTER — Encounter: Admitting: Psychology

## 2024-08-26 ENCOUNTER — Encounter: Admitting: Psychology

## 2024-09-29 ENCOUNTER — Encounter: Admitting: Psychology

## 2024-10-26 ENCOUNTER — Ambulatory Visit: Admitting: Neurology

## 2024-11-03 ENCOUNTER — Encounter: Admitting: Psychology

## 2024-12-02 ENCOUNTER — Encounter: Admitting: Psychology

## 2025-01-03 ENCOUNTER — Encounter: Admitting: Psychology

## 2025-06-08 ENCOUNTER — Ambulatory Visit
